# Patient Record
Sex: Male | Born: 1955 | Hispanic: No | Marital: Married | Smoking: Never smoker
Health system: Southern US, Community
[De-identification: ages and names within clinical notes are randomized; demographics above are authoritative.]

## PROBLEM LIST (undated history)

## (undated) DIAGNOSIS — I219 Acute myocardial infarction, unspecified: Secondary | ICD-10-CM

## (undated) DIAGNOSIS — I509 Heart failure, unspecified: Secondary | ICD-10-CM

## (undated) DIAGNOSIS — I2510 Atherosclerotic heart disease of native coronary artery without angina pectoris: Secondary | ICD-10-CM

## (undated) DIAGNOSIS — E785 Hyperlipidemia, unspecified: Secondary | ICD-10-CM

## (undated) DIAGNOSIS — E119 Type 2 diabetes mellitus without complications: Secondary | ICD-10-CM

## (undated) DIAGNOSIS — I4891 Unspecified atrial fibrillation: Secondary | ICD-10-CM

## (undated) DIAGNOSIS — I10 Essential (primary) hypertension: Secondary | ICD-10-CM

## (undated) DIAGNOSIS — E039 Hypothyroidism, unspecified: Secondary | ICD-10-CM

## (undated) DIAGNOSIS — A048 Other specified bacterial intestinal infections: Secondary | ICD-10-CM

## (undated) HISTORY — DX: Heart failure, unspecified: I50.9

## (undated) HISTORY — DX: Type 2 diabetes mellitus without complications: E11.9

## (undated) HISTORY — DX: Unspecified atrial fibrillation: I48.91

## (undated) HISTORY — DX: Hyperlipidemia, unspecified: E78.5

## (undated) HISTORY — DX: Acute myocardial infarction, unspecified: I21.9

## (undated) HISTORY — DX: Atherosclerotic heart disease of native coronary artery without angina pectoris: I25.10

## (undated) HISTORY — DX: Other specified bacterial intestinal infections: A04.8

---

## 2000-02-17 ENCOUNTER — Encounter: Admission: RE | Admit: 2000-02-17 | Discharge: 2000-02-17 | Payer: Self-pay | Admitting: Nephrology

## 2000-02-17 ENCOUNTER — Encounter: Payer: Self-pay | Admitting: Nephrology

## 2006-07-13 ENCOUNTER — Ambulatory Visit: Payer: Self-pay | Admitting: Family Medicine

## 2006-07-17 ENCOUNTER — Ambulatory Visit: Payer: Self-pay | Admitting: *Deleted

## 2006-07-31 ENCOUNTER — Ambulatory Visit: Payer: Self-pay | Admitting: Internal Medicine

## 2006-07-31 ENCOUNTER — Encounter (INDEPENDENT_AMBULATORY_CARE_PROVIDER_SITE_OTHER): Payer: Self-pay | Admitting: Nurse Practitioner

## 2006-07-31 LAB — CONVERTED CEMR LAB
ALT: 10 units/L (ref 0–53)
AST: 13 units/L (ref 0–37)
Albumin: 4.5 g/dL (ref 3.5–5.2)
Alkaline Phosphatase: 45 units/L (ref 39–117)
Bilirubin, Direct: 0.1 mg/dL (ref 0.0–0.3)
Cholesterol: 202 mg/dL — ABNORMAL HIGH (ref 0–200)
HDL: 36 mg/dL — ABNORMAL LOW (ref >39)
Indirect Bilirubin: 0.4 mg/dL (ref 0.0–0.9)
LDL Cholesterol: 128 mg/dL — ABNORMAL HIGH (ref 0–99)
Total Bilirubin: 0.5 mg/dL (ref 0.3–1.2)
Total CHOL/HDL Ratio: 5.6
Total Protein: 7.8 g/dL (ref 6.0–8.3)
Triglycerides: 188 mg/dL — ABNORMAL HIGH (ref <150)
VLDL: 38 mg/dL (ref 0–40)

## 2007-05-11 ENCOUNTER — Ambulatory Visit: Payer: Self-pay | Admitting: Nurse Practitioner

## 2007-05-11 LAB — CONVERTED CEMR LAB
ALT: 14 units/L (ref 0–53)
AST: 14 units/L (ref 0–37)
Albumin: 4.6 g/dL (ref 3.5–5.2)
Alkaline Phosphatase: 49 units/L (ref 39–117)
BUN: 14 mg/dL (ref 6–23)
Basophils Absolute: 0 10*3/uL (ref 0.0–0.1)
Basophils Relative: 1 % (ref 0–1)
CO2: 23 meq/L (ref 19–32)
Calcium: 9.8 mg/dL (ref 8.4–10.5)
Chloride: 101 meq/L (ref 96–112)
Cholesterol: 156 mg/dL (ref 0–200)
Creatinine, Ser: 1.03 mg/dL (ref 0.40–1.50)
Eosinophils Absolute: 0.2 10*3/uL (ref 0.0–0.7)
Eosinophils Relative: 3 % (ref 0–5)
Glucose, Bld: 90 mg/dL (ref 70–99)
HCT: 50.4 % (ref 39.0–52.0)
HDL: 39 mg/dL — ABNORMAL LOW (ref >39)
Hemoglobin: 16.4 g/dL (ref 13.0–17.0)
LDL Cholesterol: 89 mg/dL (ref 0–99)
Lymphocytes Relative: 37 % (ref 12–46)
Lymphs Abs: 2.7 10*3/uL (ref 0.7–4.0)
MCHC: 32.5 g/dL (ref 30.0–36.0)
MCV: 90.5 fL (ref 78.0–100.0)
Monocytes Absolute: 0.7 10*3/uL (ref 0.1–1.0)
Monocytes Relative: 10 % (ref 3–12)
Neutro Abs: 3.5 10*3/uL (ref 1.7–7.7)
Neutrophils Relative %: 49 % (ref 43–77)
PSA: 0.49 ng/mL (ref 0.10–4.00)
Platelets: 270 10*3/uL (ref 150–400)
Potassium: 4.7 meq/L (ref 3.5–5.3)
RBC: 5.57 M/uL (ref 4.22–5.81)
RDW: 13.2 % (ref 11.5–15.5)
Sodium: 138 meq/L (ref 135–145)
TSH: 1.251 microintl units/mL (ref 0.350–5.50)
Total Bilirubin: 0.5 mg/dL (ref 0.3–1.2)
Total CHOL/HDL Ratio: 4
Total Protein: 8.2 g/dL (ref 6.0–8.3)
Triglycerides: 141 mg/dL (ref <150)
VLDL: 28 mg/dL (ref 0–40)
WBC: 7.1 10*3/uL (ref 4.0–10.5)

## 2007-07-27 ENCOUNTER — Ambulatory Visit: Payer: Self-pay | Admitting: Internal Medicine

## 2007-07-27 LAB — CONVERTED CEMR LAB
ALT: 15 units/L (ref 0–53)
AST: 13 units/L (ref 0–37)
Albumin: 4.5 g/dL (ref 3.5–5.2)
Alkaline Phosphatase: 41 units/L (ref 39–117)
BUN: 18 mg/dL (ref 6–23)
Basophils Absolute: 0 10*3/uL (ref 0.0–0.1)
Basophils Relative: 1 % (ref 0–1)
CO2: 22 meq/L (ref 19–32)
Calcium: 9.3 mg/dL (ref 8.4–10.5)
Chloride: 100 meq/L (ref 96–112)
Cholesterol: 180 mg/dL (ref 0–200)
Creatinine, Ser: 0.98 mg/dL (ref 0.40–1.50)
Eosinophils Absolute: 0.1 10*3/uL (ref 0.0–0.7)
Eosinophils Relative: 2 % (ref 0–5)
Glucose, Bld: 88 mg/dL (ref 70–99)
HCT: 48.9 % (ref 39.0–52.0)
HDL: 35 mg/dL — ABNORMAL LOW (ref >39)
Hemoglobin: 16.2 g/dL (ref 13.0–17.0)
LDL Cholesterol: 120 mg/dL — ABNORMAL HIGH (ref 0–99)
Lymphocytes Relative: 40 % (ref 12–46)
Lymphs Abs: 2.6 10*3/uL (ref 0.7–4.0)
MCHC: 33.1 g/dL (ref 30.0–36.0)
MCV: 89.2 fL (ref 78.0–100.0)
Monocytes Absolute: 0.7 10*3/uL (ref 0.1–1.0)
Monocytes Relative: 10 % (ref 3–12)
Neutro Abs: 3.2 10*3/uL (ref 1.7–7.7)
Neutrophils Relative %: 48 % (ref 43–77)
Platelets: 261 10*3/uL (ref 150–400)
Potassium: 4 meq/L (ref 3.5–5.3)
RBC: 5.48 M/uL (ref 4.22–5.81)
RDW: 13.2 % (ref 11.5–15.5)
Sodium: 139 meq/L (ref 135–145)
Total Bilirubin: 0.7 mg/dL (ref 0.3–1.2)
Total CHOL/HDL Ratio: 5.1
Total Protein: 7.9 g/dL (ref 6.0–8.3)
Triglycerides: 123 mg/dL (ref <150)
VLDL: 25 mg/dL (ref 0–40)
WBC: 6.6 10*3/uL (ref 4.0–10.5)

## 2007-07-28 ENCOUNTER — Ambulatory Visit: Payer: Self-pay | Admitting: Family Medicine

## 2007-10-02 ENCOUNTER — Ambulatory Visit: Payer: Self-pay | Admitting: Internal Medicine

## 2007-10-02 LAB — CONVERTED CEMR LAB
HDL: 36 mg/dL — ABNORMAL LOW (ref >39)
LDL Cholesterol: 93 mg/dL (ref 0–99)
Triglycerides: 135 mg/dL (ref <150)
VLDL: 27 mg/dL (ref 0–40)

## 2008-06-13 ENCOUNTER — Ambulatory Visit: Payer: Self-pay | Admitting: Internal Medicine

## 2008-07-08 ENCOUNTER — Ambulatory Visit: Payer: Self-pay | Admitting: Internal Medicine

## 2008-08-22 ENCOUNTER — Encounter (INDEPENDENT_AMBULATORY_CARE_PROVIDER_SITE_OTHER): Payer: Self-pay | Admitting: *Deleted

## 2008-09-22 ENCOUNTER — Telehealth: Payer: Self-pay | Admitting: Cardiology

## 2008-09-23 ENCOUNTER — Ambulatory Visit: Payer: Self-pay | Admitting: Cardiology

## 2008-09-23 DIAGNOSIS — R079 Chest pain, unspecified: Secondary | ICD-10-CM | POA: Insufficient documentation

## 2008-09-23 DIAGNOSIS — E7800 Pure hypercholesterolemia, unspecified: Secondary | ICD-10-CM | POA: Insufficient documentation

## 2008-09-29 ENCOUNTER — Telehealth (INDEPENDENT_AMBULATORY_CARE_PROVIDER_SITE_OTHER): Payer: Self-pay | Admitting: *Deleted

## 2008-09-30 ENCOUNTER — Ambulatory Visit: Payer: Self-pay

## 2008-09-30 ENCOUNTER — Encounter: Payer: Self-pay | Admitting: Internal Medicine

## 2008-10-14 ENCOUNTER — Encounter: Payer: Self-pay | Admitting: Cardiology

## 2008-10-20 ENCOUNTER — Ambulatory Visit: Payer: Self-pay | Admitting: Cardiology

## 2008-11-03 ENCOUNTER — Other Ambulatory Visit: Admission: RE | Admit: 2008-11-03 | Discharge: 2008-11-03 | Payer: Self-pay | Admitting: Internal Medicine

## 2008-11-03 ENCOUNTER — Ambulatory Visit: Payer: Self-pay | Admitting: Internal Medicine

## 2008-11-20 ENCOUNTER — Ambulatory Visit (HOSPITAL_COMMUNITY): Admission: RE | Admit: 2008-11-20 | Discharge: 2008-11-20 | Payer: Self-pay | Admitting: Internal Medicine

## 2009-02-03 ENCOUNTER — Ambulatory Visit: Payer: Self-pay | Admitting: Internal Medicine

## 2009-04-14 ENCOUNTER — Ambulatory Visit: Payer: Self-pay | Admitting: Internal Medicine

## 2009-04-14 ENCOUNTER — Encounter (INDEPENDENT_AMBULATORY_CARE_PROVIDER_SITE_OTHER): Payer: Self-pay | Admitting: Family Medicine

## 2009-04-14 LAB — CONVERTED CEMR LAB
ALT: 18 units/L (ref 0–53)
AST: 17 units/L (ref 0–37)
Albumin: 4.7 g/dL (ref 3.5–5.2)
Alkaline Phosphatase: 66 units/L (ref 39–117)
BUN: 17 mg/dL (ref 6–23)
Basophils Absolute: 0.1 10*3/uL (ref 0.0–0.1)
Creatinine, Ser: 1.09 mg/dL (ref 0.40–1.50)
Eosinophils Relative: 3 % (ref 0–5)
HCT: 51.2 % (ref 39.0–52.0)
HDL: 36 mg/dL — ABNORMAL LOW (ref >39)
Hgb A1c MFr Bld: 8.9 % — ABNORMAL HIGH (ref 4.6–6.1)
LDL Cholesterol: 110 mg/dL — ABNORMAL HIGH (ref 0–99)
Lymphocytes Relative: 35 % (ref 12–46)
Neutrophils Relative %: 53 % (ref 43–77)
Platelets: 273 10*3/uL (ref 150–400)
Potassium: 4.9 meq/L (ref 3.5–5.3)
RDW: 13.8 % (ref 11.5–15.5)
Total CHOL/HDL Ratio: 5.3

## 2009-05-04 ENCOUNTER — Ambulatory Visit: Payer: Self-pay | Admitting: Internal Medicine

## 2009-05-11 ENCOUNTER — Ambulatory Visit (HOSPITAL_COMMUNITY): Admission: RE | Admit: 2009-05-11 | Discharge: 2009-05-11 | Payer: Self-pay | Admitting: Family Medicine

## 2009-08-03 ENCOUNTER — Ambulatory Visit: Payer: Self-pay | Admitting: Family Medicine

## 2009-08-06 ENCOUNTER — Ambulatory Visit: Payer: Self-pay | Admitting: Internal Medicine

## 2010-02-07 LAB — CONVERTED CEMR LAB
Alkaline Phosphatase: 51 units/L (ref 39–117)
Bilirubin, Direct: 0 mg/dL (ref 0.0–0.3)
Calcium: 9.6 mg/dL (ref 8.4–10.5)
Creatinine, Ser: 1 mg/dL (ref 0.4–1.5)
GFR calc non Af Amer: 100.57 mL/min (ref >60)
HDL: 29.5 mg/dL — ABNORMAL LOW (ref >39.00)
LDL Cholesterol: 133 mg/dL — ABNORMAL HIGH (ref 0–99)
Sodium: 136 meq/L (ref 135–145)
Total Bilirubin: 0.8 mg/dL (ref 0.3–1.2)
Total CHOL/HDL Ratio: 6
Total Protein: 8.1 g/dL (ref 6.0–8.3)
Triglycerides: 124 mg/dL (ref 0.0–149.0)
VLDL: 24.8 mg/dL (ref 0.0–40.0)

## 2012-09-25 ENCOUNTER — Emergency Department (HOSPITAL_COMMUNITY)
Admission: EM | Admit: 2012-09-25 | Discharge: 2012-09-25 | Disposition: A | Discharge status: Home / Self Care | Payer: Self-pay | Attending: Emergency Medicine | Admitting: Emergency Medicine

## 2012-09-25 ENCOUNTER — Emergency Department (HOSPITAL_COMMUNITY): Payer: Self-pay

## 2012-09-25 ENCOUNTER — Encounter (HOSPITAL_COMMUNITY): Payer: Self-pay | Admitting: Emergency Medicine

## 2012-09-25 DIAGNOSIS — R739 Hyperglycemia, unspecified: Secondary | ICD-10-CM

## 2012-09-25 DIAGNOSIS — E119 Type 2 diabetes mellitus without complications: Secondary | ICD-10-CM | POA: Insufficient documentation

## 2012-09-25 DIAGNOSIS — I10 Essential (primary) hypertension: Secondary | ICD-10-CM | POA: Insufficient documentation

## 2012-09-25 DIAGNOSIS — IMO0002 Reserved for concepts with insufficient information to code with codable children: Secondary | ICD-10-CM | POA: Insufficient documentation

## 2012-09-25 DIAGNOSIS — Y92410 Unspecified street and highway as the place of occurrence of the external cause: Secondary | ICD-10-CM | POA: Insufficient documentation

## 2012-09-25 DIAGNOSIS — R7309 Other abnormal glucose: Secondary | ICD-10-CM | POA: Insufficient documentation

## 2012-09-25 DIAGNOSIS — Y9389 Activity, other specified: Secondary | ICD-10-CM | POA: Insufficient documentation

## 2012-09-25 DIAGNOSIS — S0990XA Unspecified injury of head, initial encounter: Secondary | ICD-10-CM | POA: Insufficient documentation

## 2012-09-25 DIAGNOSIS — Z7982 Long term (current) use of aspirin: Secondary | ICD-10-CM | POA: Insufficient documentation

## 2012-09-25 DIAGNOSIS — S0993XA Unspecified injury of face, initial encounter: Secondary | ICD-10-CM | POA: Insufficient documentation

## 2012-09-25 HISTORY — DX: Type 2 diabetes mellitus without complications: E11.9

## 2012-09-25 HISTORY — DX: Essential (primary) hypertension: I10

## 2012-09-25 LAB — COMPREHENSIVE METABOLIC PANEL
ALT: 12 U/L (ref 0–53)
BUN: 11 mg/dL (ref 6–23)
CO2: 26 mEq/L (ref 19–32)
Calcium: 9.8 mg/dL (ref 8.4–10.5)
GFR calc Af Amer: >90 mL/min (ref >90)
GFR calc non Af Amer: >90 mL/min (ref >90)
Glucose, Bld: 395 mg/dL — ABNORMAL HIGH (ref 70–99)
Sodium: 134 mEq/L — ABNORMAL LOW (ref 135–145)

## 2012-09-25 LAB — CBC
HCT: 46.6 % (ref 39.0–52.0)
Hemoglobin: 16.7 g/dL (ref 13.0–17.0)
MCH: 30.1 pg (ref 26.0–34.0)
MCV: 84.1 fL (ref 78.0–100.0)
RBC: 5.54 MIL/uL (ref 4.22–5.81)

## 2012-09-25 LAB — GLUCOSE, CAPILLARY
Glucose-Capillary: 375 mg/dL — ABNORMAL HIGH (ref 70–99)
Glucose-Capillary: 305 mg/dL — ABNORMAL HIGH (ref 70–99)

## 2012-09-25 LAB — POCT I-STAT 3, VENOUS BLOOD GAS (G3P V)
Acid-Base Excess: 1 mmol/L (ref 0.0–2.0)
Bicarbonate: 26.6 mEq/L — ABNORMAL HIGH (ref 20.0–24.0)
TCO2: 28 mmol/L (ref 0–100)

## 2012-09-25 MED ORDER — METFORMIN HCL 500 MG PO TABS: 500.0000 mg | ORAL_TABLET | Freq: Two times a day (BID) | ORAL | Status: DC

## 2012-09-25 MED ORDER — SODIUM CHLORIDE 0.9 % IV BOLUS (SEPSIS)
1000.0000 mL | Freq: Once | INTRAVENOUS | Status: AC
  Administered 2012-09-25: 1000 mL via INTRAVENOUS

## 2012-09-25 MED ORDER — INSULIN ASPART 100 UNIT/ML ~~LOC~~ SOLN
5.0000 [IU] | Freq: Once | SUBCUTANEOUS | Status: AC
  Administered 2012-09-25: 5 [IU] via SUBCUTANEOUS
  Filled 2012-09-25: qty 1
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
## 2012-09-25 NOTE — ED Provider Notes (Signed)
Medical screening examination/treatment/procedure(s) were conducted as a shared visit with non-physician practitioner(s) and myself.  I personally evaluated the patient during the encounter  Glynn Octave, MD 09/25/12 2151

## 2013-06-07 ENCOUNTER — Ambulatory Visit
Admission: RE | Admit: 2013-06-07 | Discharge: 2013-06-07 | Disposition: A | Discharge status: Home / Self Care | Payer: BC Managed Care – PPO | Source: Ambulatory Visit | Attending: Family Medicine | Admitting: Family Medicine

## 2013-06-07 ENCOUNTER — Other Ambulatory Visit: Payer: Self-pay | Admitting: Family Medicine

## 2013-06-07 DIAGNOSIS — R109 Unspecified abdominal pain: Secondary | ICD-10-CM

## 2013-11-06 LAB — HM COLONOSCOPY

## 2014-08-28 IMAGING — CR DG ABDOMEN 2V
2 series · 2 of 2 positions shown · non-contrast
Comparison: None.

CLINICAL DATA: Left inferior quadrant pain radiating to the low
back.

EXAM:
ABDOMEN - 2 VIEW

[view not recorded (1 of 2)]
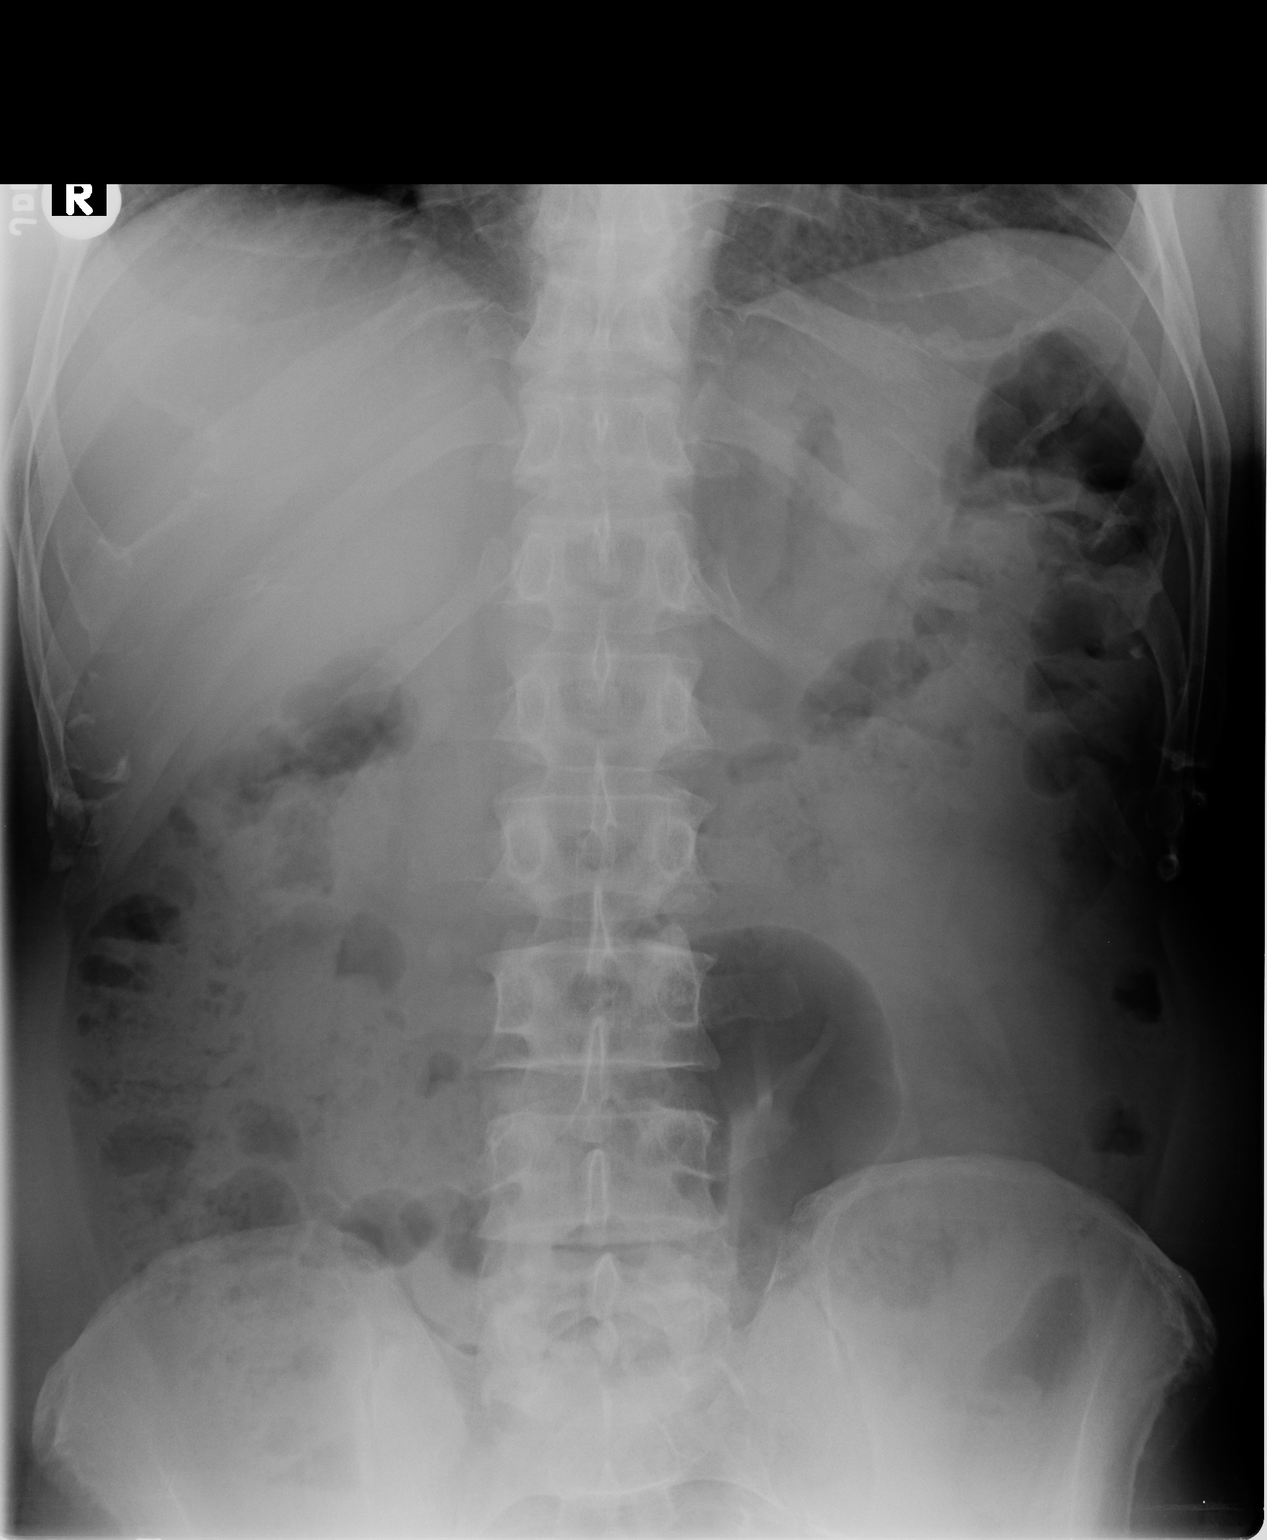

[view not recorded (2 of 2)]
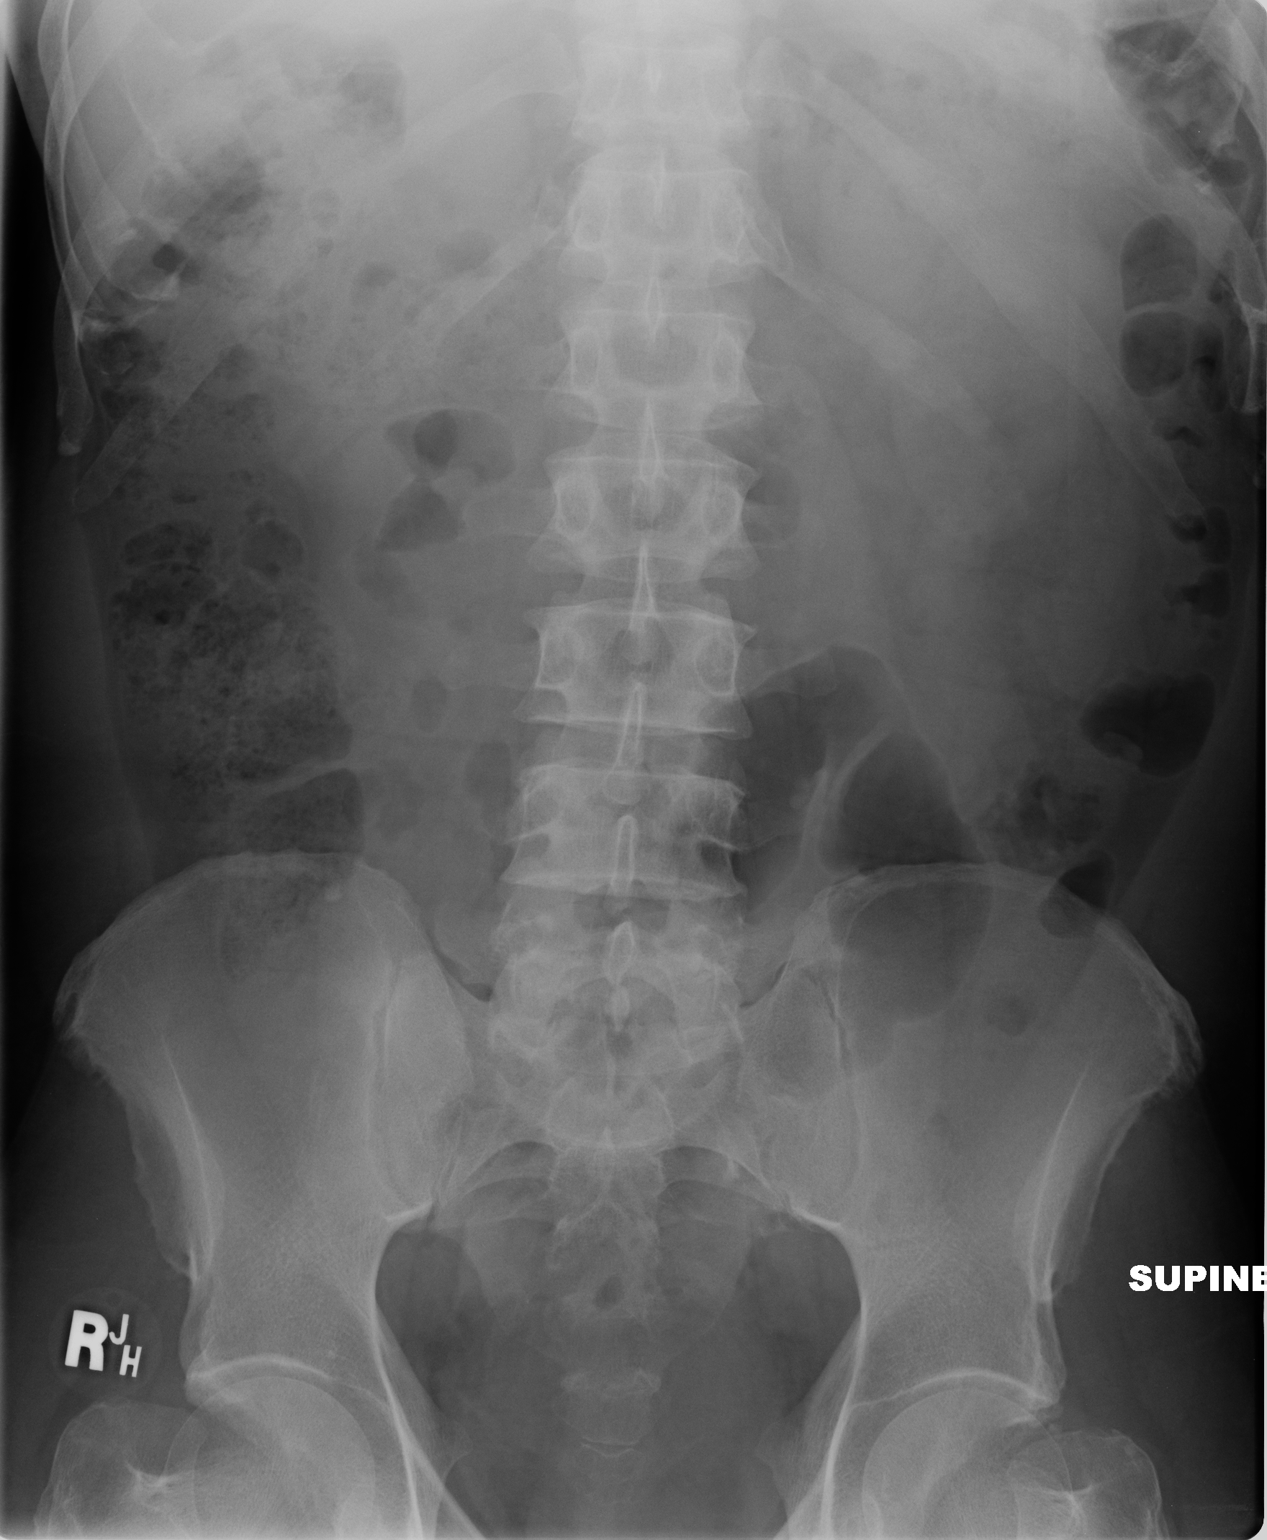

[2 of 2 positions shown; findings below may reference images not displayed]

FINDINGS: There is a large stool burden. Gaseous distension of colon is
present at the redundant sigmoid. There is no free air. No dilated
loops of bowel are present.
IMPRESSION: No plain film evidence of colonic perforation following colonoscopy.
Large right-sided stool burden. No acute abnormality. Nonobstructive
bowel gas pattern.

## 2015-01-23 DIAGNOSIS — E1165 Type 2 diabetes mellitus with hyperglycemia: Secondary | ICD-10-CM | POA: Insufficient documentation

## 2015-01-23 DIAGNOSIS — R202 Paresthesia of skin: Secondary | ICD-10-CM | POA: Insufficient documentation

## 2015-08-03 DIAGNOSIS — E559 Vitamin D deficiency, unspecified: Secondary | ICD-10-CM | POA: Insufficient documentation

## 2021-10-30 ENCOUNTER — Encounter (HOSPITAL_COMMUNITY): Payer: Self-pay | Admitting: Emergency Medicine

## 2021-10-30 ENCOUNTER — Other Ambulatory Visit: Payer: Self-pay

## 2021-10-30 ENCOUNTER — Inpatient Hospital Stay (HOSPITAL_COMMUNITY): Payer: Self-pay

## 2021-10-30 ENCOUNTER — Emergency Department (HOSPITAL_COMMUNITY): Payer: Self-pay

## 2021-10-30 ENCOUNTER — Inpatient Hospital Stay (HOSPITAL_COMMUNITY)
Admission: EM | Admit: 2021-10-30 | Discharge: 2021-10-31 | DRG: 322 | Disposition: A | Discharge status: Home / Self Care | Payer: Self-pay | Attending: Cardiology | Admitting: Cardiology

## 2021-10-30 ENCOUNTER — Inpatient Hospital Stay (HOSPITAL_COMMUNITY)
Admission: EM | Disposition: A | Discharge status: Home / Self Care | Payer: Self-pay | Source: Home / Self Care | Attending: Cardiology

## 2021-10-30 DIAGNOSIS — I213 ST elevation (STEMI) myocardial infarction of unspecified site: Principal | ICD-10-CM

## 2021-10-30 DIAGNOSIS — Z79899 Other long term (current) drug therapy: Secondary | ICD-10-CM

## 2021-10-30 DIAGNOSIS — I440 Atrioventricular block, first degree: Secondary | ICD-10-CM | POA: Diagnosis present

## 2021-10-30 DIAGNOSIS — E785 Hyperlipidemia, unspecified: Secondary | ICD-10-CM | POA: Diagnosis present

## 2021-10-30 DIAGNOSIS — Z7984 Long term (current) use of oral hypoglycemic drugs: Secondary | ICD-10-CM

## 2021-10-30 DIAGNOSIS — I2119 ST elevation (STEMI) myocardial infarction involving other coronary artery of inferior wall: Secondary | ICD-10-CM

## 2021-10-30 DIAGNOSIS — E1165 Type 2 diabetes mellitus with hyperglycemia: Secondary | ICD-10-CM | POA: Diagnosis present

## 2021-10-30 DIAGNOSIS — Z888 Allergy status to other drugs, medicaments and biological substances status: Secondary | ICD-10-CM

## 2021-10-30 DIAGNOSIS — Z7982 Long term (current) use of aspirin: Secondary | ICD-10-CM

## 2021-10-30 DIAGNOSIS — I10 Essential (primary) hypertension: Secondary | ICD-10-CM | POA: Diagnosis present

## 2021-10-30 HISTORY — PX: CORONARY/GRAFT ACUTE MI REVASCULARIZATION: CATH118305

## 2021-10-30 HISTORY — DX: ST elevation (STEMI) myocardial infarction involving other coronary artery of inferior wall: I21.19

## 2021-10-30 HISTORY — PX: LEFT HEART CATH AND CORONARY ANGIOGRAPHY: CATH118249

## 2021-10-30 LAB — COMPREHENSIVE METABOLIC PANEL
ALT: 13 U/L (ref 0–44)
AST: 13 U/L — ABNORMAL LOW (ref 15–41)
Albumin: 3.8 g/dL (ref 3.5–5.0)
Alkaline Phosphatase: 64 U/L (ref 38–126)
Anion gap: 10 (ref 5–15)
BUN: 20 mg/dL (ref 8–23)
CO2: 26 mmol/L (ref 22–32)
Calcium: 9.4 mg/dL (ref 8.9–10.3)
Chloride: 98 mmol/L (ref 98–111)
Creatinine, Ser: 1.12 mg/dL (ref 0.61–1.24)
GFR, Estimated: >60 mL/min (ref >60)
Glucose, Bld: 273 mg/dL — ABNORMAL HIGH (ref 70–99)
Potassium: 3.9 mmol/L (ref 3.5–5.1)
Sodium: 134 mmol/L — ABNORMAL LOW (ref 135–145)
Total Bilirubin: 0.4 mg/dL (ref 0.3–1.2)
Total Protein: 7.5 g/dL (ref 6.5–8.1)

## 2021-10-30 LAB — HEMOGLOBIN A1C
Hgb A1c MFr Bld: 10.4 % — ABNORMAL HIGH (ref 4.8–5.6)
Mean Plasma Glucose: 251.78 mg/dL

## 2021-10-30 LAB — BASIC METABOLIC PANEL
Anion gap: 11 (ref 5–15)
BUN: 19 mg/dL (ref 8–23)
CO2: 26 mmol/L (ref 22–32)
Calcium: 9.3 mg/dL (ref 8.9–10.3)
Chloride: 98 mmol/L (ref 98–111)
Creatinine, Ser: 1.07 mg/dL (ref 0.61–1.24)
GFR, Estimated: >60 mL/min (ref >60)
Glucose, Bld: 269 mg/dL — ABNORMAL HIGH (ref 70–99)
Potassium: 4.1 mmol/L (ref 3.5–5.1)
Sodium: 135 mmol/L (ref 135–145)

## 2021-10-30 LAB — CBC
HCT: 48 % (ref 39.0–52.0)
Hemoglobin: 15.7 g/dL (ref 13.0–17.0)
MCH: 29.3 pg (ref 26.0–34.0)
MCHC: 32.7 g/dL (ref 30.0–36.0)
MCV: 89.7 fL (ref 80.0–100.0)
Platelets: 244 10*3/uL (ref 150–400)
RBC: 5.35 MIL/uL (ref 4.22–5.81)
RDW: 12.3 % (ref 11.5–15.5)
WBC: 8.1 10*3/uL (ref 4.0–10.5)
nRBC: 0 % (ref 0.0–0.2)

## 2021-10-30 LAB — LIPID PANEL
Cholesterol: 192 mg/dL (ref 0–200)
HDL: 38 mg/dL — ABNORMAL LOW (ref >40)
LDL Cholesterol: 127 mg/dL — ABNORMAL HIGH (ref 0–99)
Total CHOL/HDL Ratio: 5.1 RATIO
Triglycerides: 133 mg/dL (ref <150)
VLDL: 27 mg/dL (ref 0–40)

## 2021-10-30 LAB — TROPONIN I (HIGH SENSITIVITY): Troponin I (High Sensitivity): 9 ng/L (ref <18)

## 2021-10-30 LAB — APTT: aPTT: 24 seconds (ref 24–36)

## 2021-10-30 LAB — GLUCOSE, CAPILLARY: Glucose-Capillary: 272 mg/dL — ABNORMAL HIGH (ref 70–99)

## 2021-10-30 LAB — PROTIME-INR
INR: 1.3 — ABNORMAL HIGH (ref 0.8–1.2)
Prothrombin Time: 15.7 seconds — ABNORMAL HIGH (ref 11.4–15.2)

## 2021-10-30 SURGERY — CORONARY/GRAFT ACUTE MI REVASCULARIZATION
Anesthesia: LOCAL

## 2021-10-30 MED ORDER — FENTANYL CITRATE PF 50 MCG/ML IJ SOSY
50.0000 ug | PREFILLED_SYRINGE | Freq: Once | INTRAMUSCULAR | Status: AC
  Administered 2021-10-30: 50 ug via INTRAVENOUS

## 2021-10-30 MED ORDER — HEPARIN SODIUM (PORCINE) 5000 UNIT/ML IJ SOLN: 60.0000 [IU]/kg | Freq: Once | INTRAMUSCULAR | Status: DC

## 2021-10-30 MED ORDER — ORAL CARE MOUTH RINSE: 15.0000 mL | OROMUCOSAL | Status: DC | PRN

## 2021-10-30 MED ORDER — ASPIRIN 81 MG PO CHEW: 324.0000 mg | CHEWABLE_TABLET | Freq: Once | ORAL | Status: DC

## 2021-10-30 MED ORDER — SODIUM CHLORIDE 0.9 % IV SOLN: INTRAVENOUS | Status: DC

## 2021-10-30 MED ORDER — ATROPINE SULFATE 1 MG/10ML IJ SOSY
0.5000 mg | PREFILLED_SYRINGE | Freq: Once | INTRAMUSCULAR | Status: AC
  Administered 2021-10-30: 0.5 mg via INTRAVENOUS

## 2021-10-30 MED ORDER — LOSARTAN POTASSIUM 25 MG PO TABS
25.0000 mg | ORAL_TABLET | Freq: Every evening | ORAL | Status: DC
  Administered 2021-10-30: 25 mg via ORAL
  Filled 2021-10-30: qty 1

## 2021-10-30 MED ORDER — SODIUM CHLORIDE 0.9 % WEIGHT BASED INFUSION
1.0000 mL/kg/h | INTRAVENOUS | Status: AC
  Administered 2021-10-30: 1 mL/kg/h via INTRAVENOUS

## 2021-10-30 MED ORDER — TICAGRELOR 90 MG PO TABS
90.0000 mg | ORAL_TABLET | Freq: Two times a day (BID) | ORAL | Status: DC
  Administered 2021-10-31: 90 mg via ORAL
  Filled 2021-10-30: qty 1

## 2021-10-30 MED ORDER — LIDOCAINE HCL (PF) 1 % IJ SOLN
INTRAMUSCULAR | Status: AC
  Filled 2021-10-30: qty 30

## 2021-10-30 MED ORDER — HEPARIN SODIUM (PORCINE) 5000 UNIT/ML IJ SOLN
4000.0000 [IU] | Freq: Once | INTRAMUSCULAR | Status: AC
  Administered 2021-10-30: 4000 [IU] via INTRAVENOUS

## 2021-10-30 MED ORDER — HEPARIN (PORCINE) IN NACL 1000-0.9 UT/500ML-% IV SOLN
INTRAVENOUS | Status: AC
  Filled 2021-10-30: qty 500

## 2021-10-30 MED ORDER — HEPARIN (PORCINE) IN NACL 1000-0.9 UT/500ML-% IV SOLN
INTRAVENOUS | Status: DC | PRN
  Administered 2021-10-30 (×2): 500 mL

## 2021-10-30 MED ORDER — HYDRALAZINE HCL 20 MG/ML IJ SOLN: 5.0000 mg | INTRAMUSCULAR | Status: DC | PRN

## 2021-10-30 MED ORDER — ASPIRIN 81 MG PO CHEW
81.0000 mg | CHEWABLE_TABLET | Freq: Every day | ORAL | Status: DC
  Administered 2021-10-31: 81 mg via ORAL
  Filled 2021-10-30: qty 1

## 2021-10-30 MED ORDER — VERAPAMIL HCL 2.5 MG/ML IV SOLN
INTRAVENOUS | Status: AC
  Filled 2021-10-30: qty 2

## 2021-10-30 MED ORDER — NITROGLYCERIN 1 MG/10 ML FOR IR/CATH LAB
INTRA_ARTERIAL | Status: DC | PRN
  Administered 2021-10-30 (×2): 200 ug via INTRA_ARTERIAL

## 2021-10-30 MED ORDER — INSULIN ASPART 100 UNIT/ML IJ SOLN
0.0000 [IU] | Freq: Three times a day (TID) | INTRAMUSCULAR | Status: DC
  Administered 2021-10-31 (×2): 5 [IU] via SUBCUTANEOUS

## 2021-10-30 MED ORDER — EMPAGLIFLOZIN 10 MG PO TABS
10.0000 mg | ORAL_TABLET | Freq: Every day | ORAL | Status: DC
  Administered 2021-10-31: 10 mg via ORAL
  Filled 2021-10-30: qty 1

## 2021-10-30 MED ORDER — FENTANYL CITRATE (PF) 100 MCG/2ML IJ SOLN
INTRAMUSCULAR | Status: AC
  Filled 2021-10-30: qty 2

## 2021-10-30 MED ORDER — ACETAMINOPHEN 325 MG PO TABS: 650.0000 mg | ORAL_TABLET | ORAL | Status: DC | PRN

## 2021-10-30 MED ORDER — TICAGRELOR 90 MG PO TABS
180.0000 mg | ORAL_TABLET | Freq: Once | ORAL | Status: AC
  Administered 2021-10-30: 180 mg via ORAL
  Filled 2021-10-30: qty 2

## 2021-10-30 MED ORDER — LIDOCAINE HCL (PF) 1 % IJ SOLN
INTRAMUSCULAR | Status: DC | PRN
  Administered 2021-10-30: 2 mL via INTRADERMAL

## 2021-10-30 MED ORDER — HEPARIN (PORCINE) IN NACL 2000-0.9 UNIT/L-% IV SOLN
INTRAVENOUS | Status: AC
  Filled 2021-10-30: qty 1000

## 2021-10-30 MED ORDER — ONDANSETRON HCL 4 MG/2ML IJ SOLN: 4.0000 mg | Freq: Four times a day (QID) | INTRAMUSCULAR | Status: DC | PRN

## 2021-10-30 MED ORDER — ATORVASTATIN CALCIUM 80 MG PO TABS
80.0000 mg | ORAL_TABLET | Freq: Every day | ORAL | Status: DC
  Administered 2021-10-31: 80 mg via ORAL
  Filled 2021-10-30: qty 1

## 2021-10-30 MED ORDER — IOHEXOL 350 MG/ML SOLN
INTRAVENOUS | Status: DC | PRN
  Administered 2021-10-30: 110 mL via INTRA_ARTERIAL

## 2021-10-30 MED ORDER — TICAGRELOR 90 MG PO TABS: 90.0000 mg | ORAL_TABLET | Freq: Two times a day (BID) | ORAL | Status: DC

## 2021-10-30 MED ORDER — INSULIN ASPART 100 UNIT/ML IJ SOLN
0.0000 [IU] | Freq: Every day | INTRAMUSCULAR | Status: DC
  Administered 2021-10-30: 3 [IU] via SUBCUTANEOUS

## 2021-10-30 MED ORDER — HEPARIN SODIUM (PORCINE) 1000 UNIT/ML IJ SOLN
INTRAMUSCULAR | Status: DC | PRN
  Administered 2021-10-30: 7000 [IU] via INTRAVENOUS

## 2021-10-30 MED ORDER — SODIUM CHLORIDE 0.9% FLUSH: 3.0000 mL | INTRAVENOUS | Status: DC | PRN

## 2021-10-30 MED ORDER — METOPROLOL TARTRATE 25 MG PO TABS
25.0000 mg | ORAL_TABLET | Freq: Two times a day (BID) | ORAL | Status: DC
  Administered 2021-10-30: 25 mg via ORAL
  Filled 2021-10-30: qty 1

## 2021-10-30 MED ORDER — SODIUM CHLORIDE 0.9 % IV SOLN: 250.0000 mL | INTRAVENOUS | Status: DC | PRN

## 2021-10-30 MED ORDER — HEPARIN SODIUM (PORCINE) 1000 UNIT/ML IJ SOLN
INTRAMUSCULAR | Status: AC
  Filled 2021-10-30: qty 10

## 2021-10-30 MED ORDER — SODIUM CHLORIDE 0.9% FLUSH: 3.0000 mL | Freq: Two times a day (BID) | INTRAVENOUS | Status: DC

## 2021-10-30 MED ORDER — HEPARIN (PORCINE) 25000 UT/250ML-% IV SOLN
950.0000 [IU]/h | INTRAVENOUS | Status: DC
  Administered 2021-10-30: 950 [IU]/h via INTRAVENOUS

## 2021-10-30 MED ORDER — ASPIRIN 81 MG PO CHEW
81.0000 mg | CHEWABLE_TABLET | Freq: Once | ORAL | Status: AC
  Administered 2021-10-30: 81 mg via ORAL

## 2021-10-30 MED ORDER — NITROGLYCERIN 1 MG/10 ML FOR IR/CATH LAB
INTRA_ARTERIAL | Status: AC
  Filled 2021-10-30: qty 10

## 2021-10-30 SURGICAL SUPPLY — 17 items
BALLN SAPPHIRE 2.5X12 (BALLOONS) ×1
BALLOON SAPPHIRE 2.5X12 (BALLOONS) IMPLANT
CATH INFINITI 5 FR JL3.5 (CATHETERS) IMPLANT
CATH LAUNCHER 6FR JR4 (CATHETERS) IMPLANT
CATH OPTITORQUE TIG 4.0 5F (CATHETERS) IMPLANT
DEVICE RAD COMP TR BAND LRG (VASCULAR PRODUCTS) IMPLANT
GLIDESHEATH SLEND A-KIT 6F 22G (SHEATH) IMPLANT
GUIDEWIRE INQWIRE 1.5J.035X260 (WIRE) IMPLANT
INQWIRE 1.5J .035X260CM (WIRE) ×1
KIT ENCORE 26 ADVANTAGE (KITS) IMPLANT
KIT HEART LEFT (KITS) ×2 IMPLANT
PACK CARDIAC CATHETERIZATION (CUSTOM PROCEDURE TRAY) ×2 IMPLANT
STENT SYNERGY XD 2.75X32 (Permanent Stent) IMPLANT
SYNERGY XD 2.75X32 (Permanent Stent) ×1 IMPLANT
TRANSDUCER W/STOPCOCK (MISCELLANEOUS) ×2 IMPLANT
TUBING CIL FLEX 10 FLL-RA (TUBING) ×2 IMPLANT
WIRE COUGAR XT STRL 190CM (WIRE) IMPLANT

## 2021-10-30 NOTE — ED Provider Notes (Signed)
  [facility] DEPART[facility] Provider Note   CSN: [id number] Arrival date & time: 10/30/21  1849     History {Add pertinent medical, surgical, social history, OB history to HPI:1} Chief Complaint  Patient presents with   Chest Pain    Garrett Roberts is a 66 y.o. male.  1.5 hours cp and sweaty n but no v  between 6-7 no dizzy no fam hx no smoke no cardiac only hx of DM and HLD Took 3 baby aspirin prior to arrival       Home Medications Prior to Admission medications   Medication Sig Start Date End Date Taking? Authorizing Provider  aspirin 81 MG tablet Take 81 mg by mouth daily.    [provider]  metFORMIN (GLUCOPHAGE) 500 MG tablet Take 1 tablet (500 mg total) by mouth 2 (two) times daily. 09/25/12   Schinlever, Barnetta Chapel, PA-C  METFORMIN HCL PO Take by mouth.    [provider]      Allergies    Atorvastatin    Review of Systems   Review of Systems  Physical Exam Updated Vital Signs There were no vitals taken for this visit. Physical Exam  ED Results / Procedures / Treatments   Labs (all labs ordered are listed, but only abnormal results are displayed) Labs Reviewed  BASIC METABOLIC PANEL  CBC  TROPONIN I (HIGH SENSITIVITY)    EKG None  Radiology No results found.  Procedures Procedures  {Document cardiac monitor, telemetry assessment procedure when appropriate:1}  Medications Ordered in ED Medications - No data to display  ED Course/ Medical Decision Making/ A&P Clinical Course as of 10/30/21 1917  Sat Oct 30, 2021  1914 Cath lab activated.  [RP]  1916 Dr Alfred Levins from cardiology at the bedside.  [RP]    Clinical Course User Index [RP] Fransico Meadow, MD                           Medical Decision Making Amount and/or Complexity of Data Reviewed Labs: ordered. Radiology: ordered.  Risk OTC drugs. Prescription drug management.   ***  {Document critical care time when  appropriate:1} {Document review of labs and clinical decision tools ie heart score, Chads2Vasc2 etc:1}  {Document your independent review of radiology images, and any outside records:1} {Document your discussion with family members, caretakers, and with consultants:1} {Document social determinants of health affecting pt's care:1} {Document your decision making why or why not admission, treatments were needed:1} Final Clinical Impression(s) / ED Diagnoses Final diagnoses:  None    Rx / DC Orders ED Discharge Orders     None

## 2021-10-30 NOTE — ED Notes (Signed)
Taken to Cath lab with RN and cardiologist

## 2021-10-30 NOTE — ED Triage Notes (Signed)
Patient here with chest pain that started an hour ago with [facility]. No cardiac hx per pt.
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
## 2021-10-30 NOTE — Progress Notes (Signed)
ANTICOAGULATION CONSULT NOTE - Initial Consult  Pharmacy Consult for Heparin Indication:  STEMI  Allergies  Allergen Reactions   Atorvastatin     REACTION: myalgias    Patient Measurements: Height: 5\' 9"  (175.3 cm) Weight: 79.4 kg (175 lb) IBW/kg (Calculated) : 70.7 Heparin Dosing Weight: 79kg  Vital Signs: BP: 197/82 (10/21 1930) Pulse Rate: 54 (10/21 1930)  Labs: Recent Labs    10/30/21 1900  HGB 15.7  HCT 48.0  PLT 244    CrCl cannot be calculated (Patient's most recent lab result is older than the maximum 21 days allowed.).   Medical History: Past Medical History:  Diagnosis Date   Diabetes mellitus without complication ([place])    Hypertension     Medications:  (Not in a hospital admission)  Scheduled:   fentaNYL       fentaNYL (SUBLIMAZE) injection  50 mcg Intravenous Once   ticagrelor  180 mg Oral Once   Infusions:   sodium chloride     heparin 950 Units/hr (10/30/21 1928)   PRN: fentaNYL  Assessment: 63 yom with presenting with STEMI. Heparin per pharmacy consult placed for  STEMI .  Patient is not on anticoagulation prior to arrival.  Hgb 15.7; plt 244  Goal of Therapy:  Heparin level 0.3-0.7 units/ml Monitor platelets by anticoagulation protocol: Yes   Plan:  Give IV heparin 4000 units bolus x 1 Start heparin infusion at 950 units/hr Pharmacy to f/u heparin plan post cath Continue to monitor H&H and platelets  Lorelei Pont, PharmD, BCPS 10/30/2021 7:34 PM ED Clinical Pharmacist -  [phone]

## 2021-10-31 ENCOUNTER — Inpatient Hospital Stay (HOSPITAL_COMMUNITY): Payer: Self-pay

## 2021-10-31 ENCOUNTER — Encounter (HOSPITAL_COMMUNITY): Payer: Self-pay | Admitting: Cardiology

## 2021-10-31 LAB — ECHOCARDIOGRAM COMPLETE
Area-P 1/2: 3.12 cm2
Height: 69 in
S' Lateral: 3.4 cm
Weight: 2800 oz

## 2021-10-31 LAB — BASIC METABOLIC PANEL
Anion gap: 9 (ref 5–15)
BUN: 14 mg/dL (ref 8–23)
CO2: 23 mmol/L (ref 22–32)
Calcium: 9.1 mg/dL (ref 8.9–10.3)
Chloride: 104 mmol/L (ref 98–111)
Creatinine, Ser: 0.79 mg/dL (ref 0.61–1.24)
GFR, Estimated: >60 mL/min (ref >60)
Glucose, Bld: 253 mg/dL — ABNORMAL HIGH (ref 70–99)
Potassium: 3.9 mmol/L (ref 3.5–5.1)
Sodium: 136 mmol/L (ref 135–145)

## 2021-10-31 LAB — CBC
HCT: 44.4 % (ref 39.0–52.0)
Hemoglobin: 15.2 g/dL (ref 13.0–17.0)
MCH: 29.6 pg (ref 26.0–34.0)
MCHC: 34.2 g/dL (ref 30.0–36.0)
MCV: 86.4 fL (ref 80.0–100.0)
Platelets: 216 10*3/uL (ref 150–400)
RBC: 5.14 MIL/uL (ref 4.22–5.81)
RDW: 12.1 % (ref 11.5–15.5)
WBC: 10.7 10*3/uL — ABNORMAL HIGH (ref 4.0–10.5)
nRBC: 0 % (ref 0.0–0.2)

## 2021-10-31 LAB — GLUCOSE, CAPILLARY
Glucose-Capillary: 224 mg/dL — ABNORMAL HIGH (ref 70–99)
Glucose-Capillary: 230 mg/dL — ABNORMAL HIGH (ref 70–99)

## 2021-10-31 LAB — POCT I-STAT, CHEM 8
BUN: 20 mg/dL (ref 8–23)
Calcium, Ion: 1.12 mmol/L — ABNORMAL LOW (ref 1.15–1.40)
Chloride: 99 mmol/L (ref 98–111)
Creatinine, Ser: 0.8 mg/dL (ref 0.61–1.24)
Glucose, Bld: 297 mg/dL — ABNORMAL HIGH (ref 70–99)
HCT: 48 % (ref 39.0–52.0)
Hemoglobin: 16.3 g/dL (ref 13.0–17.0)
Potassium: 4.1 mmol/L (ref 3.5–5.1)
Sodium: 134 mmol/L — ABNORMAL LOW (ref 135–145)
TCO2: 24 mmol/L (ref 22–32)

## 2021-10-31 LAB — MRSA NEXT GEN BY PCR, NASAL: MRSA by PCR Next Gen: NOT DETECTED

## 2021-10-31 LAB — POCT ACTIVATED CLOTTING TIME: Activated Clotting Time: 287 seconds

## 2021-10-31 LAB — TROPONIN I (HIGH SENSITIVITY): Troponin I (High Sensitivity): 8096 ng/L (ref <18)

## 2021-10-31 MED ORDER — TICAGRELOR 90 MG PO TABS
90.0000 mg | ORAL_TABLET | Freq: Once | ORAL | Status: AC
  Administered 2021-10-31: 90 mg via ORAL
  Filled 2021-10-31: qty 1

## 2021-10-31 MED ORDER — EMPAGLIFLOZIN 10 MG PO TABS: 10.0000 mg | ORAL_TABLET | Freq: Every day | ORAL | 6 refills | Status: DC

## 2021-10-31 MED ORDER — PERFLUTREN LIPID MICROSPHERE
1.0000 mL | INTRAVENOUS | Status: AC | PRN
  Administered 2021-10-31: 3 mL via INTRAVENOUS

## 2021-10-31 MED ORDER — ATORVASTATIN CALCIUM 80 MG PO TABS: 80.0000 mg | ORAL_TABLET | Freq: Every day | ORAL | 6 refills | Status: DC

## 2021-10-31 MED ORDER — LOSARTAN POTASSIUM 25 MG PO TABS: 25.0000 mg | ORAL_TABLET | Freq: Every evening | ORAL | 6 refills | Status: DC

## 2021-10-31 MED ORDER — METOPROLOL TARTRATE 25 MG PO TABS: 12.5000 mg | ORAL_TABLET | Freq: Two times a day (BID) | ORAL | 6 refills | Status: DC

## 2021-10-31 MED ORDER — ASPIRIN 81 MG PO CHEW: 81.0000 mg | CHEWABLE_TABLET | Freq: Every day | ORAL | 6 refills | Status: DC

## 2021-10-31 MED ORDER — TICAGRELOR 90 MG PO TABS: 90.0000 mg | ORAL_TABLET | Freq: Two times a day (BID) | ORAL | 6 refills | Status: DC

## 2021-10-31 NOTE — Progress Notes (Signed)
Patient transferred from [facility] at 1315hrs. Oriented to unit and plan of care for shift, patient verbalized understanding.  Family at bedside.

## 2021-10-31 NOTE — Progress Notes (Signed)
Subjective:  Patient seen and examined at bedside, resting comfortably. No events overnight. Denies CP, SOB, palpitations, diaphoresis, syncope.  Intake/Output from previous day:  I/O last 3 completed shifts: In: 773.2 [I.V.:773.2] Out: 1800 [Urine:1800] Total I/O In: 79.4 [I.V.:79.4] Out: -  Net IO Since Admission: -300.37 mL [10/31/21 1118]  Blood pressure 133/68, pulse (!) 55, temperature 98.2 F (36.8 C), temperature source Oral, resp. rate 18, height $RemoveBe'5\' 9"'gICZPrPmi$  (1.753 m), weight 79.4 kg, SpO2 99 %. Physical Exam Vitals reviewed.  Constitutional:      General: He is not in acute distress. Cardiovascular:     Rate and Rhythm: Regular rhythm. Bradycardia present.     Pulses: Normal pulses.     Heart sounds: Normal heart sounds. No murmur heard. Pulmonary:     Effort: Pulmonary effort is normal.     Breath sounds: Normal breath sounds.  Musculoskeletal:     Right lower leg: No edema.     Left lower leg: No edema.  Skin:    General: Skin is warm and dry.  Neurological:     Mental Status: He is alert.     Lab Results: Lab Results  Component Value Date   NA 136 10/31/2021   K 3.9 10/31/2021   CO2 23 10/31/2021   GLUCOSE 253 (H) 10/31/2021   BUN 14 10/31/2021   CREATININE 0.79 10/31/2021   CALCIUM 9.1 10/31/2021   GFRNONAA >60 10/31/2021    BNP (last 3 results) No results for input(s): "BNP" in the last 8760 hours.  ProBNP (last 3 results) No results for input(s): "PROBNP" in the last 8760 hours.    Latest Ref Rng & Units 10/31/2021    3:51 AM 10/30/2021    9:21 PM 10/30/2021    8:13 PM  BMP  Glucose 70 - 99 mg/dL 253  269  297   BUN 8 - 23 mg/dL $Remove'14  19  20   'SFQMjay$ Creatinine 0.61 - 1.24 mg/dL 0.79  1.07  0.80   Sodium 135 - 145 mmol/L 136  135  134   Potassium 3.5 - 5.1 mmol/L 3.9  4.1  4.1   Chloride 98 - 111 mmol/L 104  98  99   CO2 22 - 32 mmol/L 23  26    Calcium 8.9 - 10.3 mg/dL 9.1  9.3        Latest Ref Rng & Units 10/30/2021    7:00 PM 09/25/2012     6:23 PM 04/14/2009   10:41 PM  Hepatic Function  Total Protein 6.5 - 8.1 g/dL 7.5  8.3  7.8   Albumin 3.5 - 5.0 g/dL 3.8  4.1  4.7   AST 15 - 41 U/L $Remo'13  16  17   'VbxcF$ ALT 0 - 44 U/L $Remo'13  12  18   'PAwcw$ Alk Phosphatase 38 - 126 U/L 64  89  66   Total Bilirubin 0.3 - 1.2 mg/dL 0.4  0.3  0.5       Latest Ref Rng & Units 10/31/2021    3:51 AM 10/30/2021    8:13 PM 10/30/2021    7:00 PM  CBC  WBC 4.0 - 10.5 K/uL 10.7   8.1   Hemoglobin 13.0 - 17.0 g/dL 15.2  16.3  15.7   Hematocrit 39.0 - 52.0 % 44.4  48.0  48.0   Platelets 150 - 400 K/uL 216   244    Lipid Panel     Component Value Date/Time   CHOL 192 10/30/2021 1900   TRIG  133 10/30/2021 1900   HDL 38 (L) 10/30/2021 1900   CHOLHDL 5.1 10/30/2021 1900   VLDL 27 10/30/2021 1900   LDLCALC 127 (H) 10/30/2021 1900   Cardiac Panel (last 3 results) No results for input(s): "CKTOTAL", "CKMB", "TROPONINI", "RELINDX" in the last 72 hours.  HEMOGLOBIN A1C Lab Results  Component Value Date   HGBA1C 10.4 (H) 10/30/2021   MPG 251.78 10/30/2021   TSH No results for input(s): "TSH" in the last 8760 hours. Imaging: Imaging results have been reviewed and ECHOCARDIOGRAM COMPLETE  Result Date: 10/31/2021    ECHOCARDIOGRAM REPORT   Patient Name:   Garrett Roberts Date of Exam: 10/31/2021 Medical Rec #:  [id number]            Height:       69.0 in Accession #:    [id number]           Weight:       175.0 lb Date of Birth:  14-Feb-1955           BSA:          1.952 m Patient Age:    66 years             BP:           125/63 mmHg Patient Gender: M                    HR:           52 bpm. Exam Location:  Inpatient Procedure: 2D Echo, 3D Echo, Cardiac Doppler, Color Doppler and Intracardiac            Opacification Agent Indications:     Acute myocardial infarction, unspecified I21.9  History:         Patient has no prior history of Echocardiogram examinations.                  STEMI, Signs/Symptoms:Chest Pain; Risk Factors:Dyslipidemia,                   Diabetes, Hypertension and Non-Smoker.  Sonographer:     Greer Pickerel Referring Phys:  [id number] [facility] Lashun Mccants Diagnosing Phys: Collene Mares Denny Mccree  Sonographer Comments: Image acquisition challenging due to respiratory motion. IMPRESSIONS  1. Left ventricular ejection fraction, by estimation, is 55 to 60%. Left ventricular ejection fraction by 3D volume is 58 %. Left ventricular ejection fraction by PLAX is 58 %. The left ventricle has normal function. Left ventricular endocardial border not optimally defined to evaluate regional wall motion. Left ventricular diastolic parameters are consistent with Grade I diastolic dysfunction (impaired relaxation). Elevated left atrial pressure. The E/e' is 14.5.  2. Right ventricular systolic function is normal. The right ventricular size is normal. There is normal pulmonary artery systolic pressure.  3. The mitral valve is normal in structure. No evidence of mitral valve regurgitation.  4. The aortic valve is normal in structure. Aortic valve regurgitation is not visualized. Conclusion(s)/Recommendation(s): Normal biventricular function without evidence of hemodynamically significant valvular heart disease. FINDINGS  Left Ventricle: Left ventricular ejection fraction, by estimation, is 55 to 60%. Left ventricular ejection fraction by PLAX is 58 %. Left ventricular ejection fraction by 3D volume is 58 %. The left ventricle has normal function. Left ventricular endocardial border not optimally defined to evaluate regional wall motion. Definity contrast agent was given IV to delineate the left ventricular endocardial borders. The left ventricular internal cavity size was normal in size. Suboptimal image quality limits for assessment of left  ventricular hypertrophy. Left ventricular diastolic parameters are consistent with Grade I diastolic dysfunction (impaired relaxation). Elevated left atrial pressure. The E/e' is 14.5. Right Ventricle: The right ventricular size is normal. No  increase in right ventricular wall thickness. Right ventricular systolic function is normal. There is normal pulmonary artery systolic pressure. The tricuspid regurgitant velocity is 1.30 m/s, and  with an assumed right atrial pressure of 3 mmHg, the estimated right ventricular systolic pressure is 9.8 mmHg. Left Atrium: Left atrial size was normal in size. Right Atrium: Right atrial size was normal in size. Pericardium: There is no evidence of pericardial effusion. Mitral Valve: The mitral valve is normal in structure. No evidence of mitral valve regurgitation. Tricuspid Valve: The tricuspid valve is normal in structure. Tricuspid valve regurgitation is not demonstrated. Aortic Valve: The aortic valve is normal in structure. Aortic valve regurgitation is not visualized. Pulmonic Valve: The pulmonic valve was normal in structure. Pulmonic valve regurgitation is not visualized. Aorta: The aortic root is normal in size and structure. IAS/Shunts: No atrial level shunt detected by color flow Doppler.  LEFT VENTRICLE PLAX 2D LV EF:         Left            Diastology                ventricular     LV e' medial:    5.27 cm/s                ejection        LV E/e' medial:  17.3                fraction by     LV e' lateral:   7.54 cm/s                PLAX is 58      LV E/e' lateral: 12.1                %. LVIDd:         4.90 cm LVIDs:         3.40 cm         3D Volume EF LV PW:         0.80 cm         LV 3D EF:    Left LV IVS:        1.20 cm                      ventricul LVOT diam:     2.00 cm                      ar LV SV:         67                           ejection LV SV Index:   34                           fraction LVOT Area:     3.14 cm                     by 3D  volume is                                             58 %.                                 3D Volume EF:                                3D EF:        58 %                                LV EDV:       178 ml                                 LV ESV:       76 ml                                LV SV:        103 ml RIGHT VENTRICLE RV S prime:     15.30 cm/s TAPSE (M-mode): 2.0 cm LEFT ATRIUM             Index        RIGHT ATRIUM           Index LA diam:        3.70 cm 1.90 cm/m   RA Area:     21.20 cm LA Vol (A2C):   43.6 ml 22.34 ml/m  RA Volume:   64.10 ml  32.84 ml/m LA Vol (A4C):   56.8 ml 29.10 ml/m LA Biplane Vol: 50.6 ml 25.92 ml/m  AORTIC VALVE LVOT Vmax:   88.60 cm/s LVOT Vmean:  56.800 cm/s LVOT VTI:    0.214 m  AORTA Ao Root diam: 3.80 cm Ao Asc diam:  4.05 cm MITRAL VALVE                TRICUSPID VALVE MV Area (PHT): 3.12 cm     TR Peak grad:   6.8 mmHg MV Decel Time: 243 msec     TR Vmax:        130.00 cm/s MV E velocity: 91.30 cm/s MV A velocity: 106.00 cm/s  SHUNTS MV E/A ratio:  0.86         Systemic VTI:  0.21 m                             Systemic Diam: 2.00 cm Collene Mares Ronesha Heenan Electronically signed by Floydene Flock Signature Date/Time: 10/31/2021/3:41:50 PM    Final    DG Chest Port 1 View  Result Date: 10/30/2021 CLINICAL DATA:  Chest x-ray ordered due to STEMI. EXAM: PORTABLE CHEST 1 VIEW COMPARISON:  None Available. FINDINGS: Multiple overlying monitor wires. There is mild cardiomegaly, mild central vascular prominence. There is no evidence of edema or focal lung infiltrate. No pleural effusion is seen. The mediastinum is normally outlined. No acute osseous abnormality. IMPRESSION: Mild cardiomegaly and mild central vascular distention. No  other evidence of acute chest process. Electronically Signed   By: Telford Nab M.D.   On: 10/30/2021 21:35   CARDIAC CATHETERIZATION  Result Date: 10/30/2021 Images from the original result were not included. Left Heart Catheterization 10/30/21: LV 191/2, EDP 12 mmHg.  Ao 156/98, mean 122 mmHg.  No pressure gradient across the aortic valve.  LVEF 40 to 45% with inferior akinesis. LM: Large caliber vessel.  Smooth and normal. LAD: Large caliber vessel.   Mid segment has a focal 50% stenosis.  Gives origin to very small D1 which is diffusely diseased, small to moderate size D2 which is diffusely diseased with a proximal diffuse 90% stenosis.  Mid to distal LAD has minimal disease. CX: Large caliber vessel in the proximal segment.  Mid segment has a 30% stenosis of the origin of small OM 2.  OM1 is very large with a focal 90% stenosis however severely tortuous.  OM 3 is small with ostial 99% stenosis and distal circumflex is severely diffusely diseased and small. RCA: Large caliber similar dominant vessel.  100% occlusion in the mid segment at the origin of RV branch. Successful PTCA and stenting with 2.75 x 32 mm Synergy XD DES, stenosis reduced from 100% to 0% with TIMI 0 to TIMI-3 flow. Recommendation: Aggressive risk modification is indicated.  OM 1 is a moderate large caliber vessel however severely tortuous and high risk for intervention.  D2 is small to moderate-sized, again diffusely diseased.  Dual antiplatelet therapy with aspirin and Brilinta for 1 year.    Cardiac Studies:  Left Heart Catheterization 10/30/21:  LV 191/2, EDP 12 mmHg.  Ao 156/98, mean 122 mmHg.  No pressure gradient across the aortic valve.  LVEF 40 to 45% with inferior akinesis. LM: Large caliber vessel.  Smooth and normal. LAD: Large caliber vessel.  Mid segment has a focal 50% stenosis.  Gives origin to very small D1 which is diffusely diseased, small to moderate size D2 which is diffusely diseased with a proximal diffuse 90% stenosis.  Mid to distal LAD has minimal disease. CX: Large caliber vessel in the proximal segment.  Mid segment has a 30% stenosis of the origin of small OM 2.  OM1 is very large with a focal 90% stenosis however severely tortuous.  OM 3 is small with ostial 99% stenosis and distal circumflex is severely diffusely diseased and small. RCA: Large caliber similar dominant vessel.  100% occlusion in the mid segment at the origin of RV branch. Successful PTCA  and stenting with 2.75 x 32 mm Synergy XD DES, stenosis reduced from 100% to 0% with TIMI 0 to TIMI-3 flow.   Recommendation: Aggressive risk modification is indicated.  OM 1 is a moderate large caliber vessel however severely tortuous and high risk for intervention.  D2 is small to moderate-sized, again diffusely diseased.  Dual antiplatelet therapy with aspirin and Brilinta for 1 year.      EKG 10/31/2021: sinus bradycardia, HR 51, old inferior infarct  No results found for this or any previous visit (from the past 43800 hour(s)).  Scheduled Meds:  aspirin  81 mg Oral Daily   atorvastatin  80 mg Oral Daily   empagliflozin  10 mg Oral Daily   insulin aspart  0-15 Units Subcutaneous TID WC   insulin aspart  0-5 Units Subcutaneous QHS   losartan  25 mg Oral QPM   metoprolol tartrate  25 mg Oral BID   sodium chloride flush  3 mL Intravenous Q12H   ticagrelor  90 mg Oral BID   Continuous Infusions:  sodium chloride     PRN Meds:.sodium chloride, acetaminophen, ondansetron (ZOFRAN) IV, mouth rinse, sodium chloride flush  Assessment  Garrett Roberts is a 66 y.o. male patient with   Inferior STEMI s/p DES to RCA HTN HLD NIDDM2  Plan:   Echo ordered, pending discharge EKG and telemetry reviewed Medications sent to pharmacy Continue ASA, Brillinta, Metoprolol, Losartan, Lipitor Ok to hold BB given sinus bradycardia    Floydene Flock, DO, [facility] 10/31/2021, 11:18 AM Office: [phone] Fax: [phone] Pager: [phone]

## 2021-10-31 NOTE — Care Management (Signed)
  Transition of Care [facility]) Screening Note   Patient Details  Name: Garrett Roberts Date of Birth: 11-Nov-1955   Transition of Care [facility]) CM/SW Contact:    Bethena Roys, RN Phone Number: 10/31/2021, 4:47 PM    Transition of Care Department [facility]) has reviewed the patient. Patient is without insurance at this time. Patient is being discharged on Jardiance and Brilinta. Jardiance 14 day free card and Brilinta 30 day free card utilized at [facility]. Patient will need to follow up with Dr. Einar Gip for additional samples or patient assistance in the office. No further needs identified at this time.

## 2021-10-31 NOTE — Progress Notes (Signed)
  Echocardiogram 2D Echocardiogram has been performed.  Garrett Roberts 10/31/2021, 4:28 PM

## 2021-10-31 NOTE — Progress Notes (Signed)
Okay to d/c home per Dr Einar Gip. Orders received to give Brilinta x 1 prior to d/c as his [facility] pharmacy is closed and he will not be able to fill Rx until tomorrow. Brilinta and Jardiance cards given to pt. Post cath Radial site care education given to pt. D/C to home with family via w/c to front entrance.

## 2021-10-31 NOTE — Discharge Summary (Signed)
Subjective:  Patient seen and examined at bedside, resting comfortably. No events overnight. Denies CP, SOB, palpitations, diaphoresis, syncope.  Intake/Output from previous day:  I/O last 3 completed shifts: In: 773.2 [I.V.:773.2] Out: 1800 [Urine:1800] Total I/O In: 79.4 [I.V.:79.4] Out: -  Net IO Since Admission: -845.37 mL [10/31/21 1118]  Blood pressure 133/68, pulse (!) 55, temperature 98.2 F (36.8 C), temperature source Oral, resp. rate 18, height $RemoveBe'5\' 9"'LPSNoJrjM$  (1.753 m), weight 79.4 kg, SpO2 99 %. Physical Exam Vitals reviewed.  Constitutional:      General: He is not in acute distress. Cardiovascular:     Rate and Rhythm: Regular rhythm. Bradycardia present.     Pulses: Normal pulses.     Heart sounds: Normal heart sounds. No murmur heard. Pulmonary:     Effort: Pulmonary effort is normal.     Breath sounds: Normal breath sounds.  Musculoskeletal:     Right lower leg: No edema.     Left lower leg: No edema.  Skin:    General: Skin is warm and dry.  Neurological:     Mental Status: He is alert.     Lab Results: Lab Results  Component Value Date   NA 136 10/31/2021   K 3.9 10/31/2021   CO2 23 10/31/2021   GLUCOSE 253 (H) 10/31/2021   BUN 14 10/31/2021   CREATININE 0.79 10/31/2021   CALCIUM 9.1 10/31/2021   GFRNONAA >60 10/31/2021    BNP (last 3 results) No results for input(s): "BNP" in the last 8760 hours.  ProBNP (last 3 results) No results for input(s): "PROBNP" in the last 8760 hours.    Latest Ref Rng & Units 10/31/2021    3:51 AM 10/30/2021    9:21 PM 10/30/2021    8:13 PM  BMP  Glucose 70 - 99 mg/dL 253  269  297   BUN 8 - 23 mg/dL $Remove'14  19  20   'VQKaHGd$ Creatinine 0.61 - 1.24 mg/dL 0.79  1.07  0.80   Sodium 135 - 145 mmol/L 136  135  134   Potassium 3.5 - 5.1 mmol/L 3.9  4.1  4.1   Chloride 98 - 111 mmol/L 104  98  99   CO2 22 - 32 mmol/L 23  26    Calcium 8.9 - 10.3 mg/dL 9.1  9.3        Latest Ref Rng & Units 10/30/2021    7:00 PM 09/25/2012     6:23 PM 04/14/2009   10:41 PM  Hepatic Function  Total Protein 6.5 - 8.1 g/dL 7.5  8.3  7.8   Albumin 3.5 - 5.0 g/dL 3.8  4.1  4.7   AST 15 - 41 U/L $Remo'13  16  17   'PLnHq$ ALT 0 - 44 U/L $Remo'13  12  18   'YmGkl$ Alk Phosphatase 38 - 126 U/L 64  89  66   Total Bilirubin 0.3 - 1.2 mg/dL 0.4  0.3  0.5       Latest Ref Rng & Units 10/31/2021    3:51 AM 10/30/2021    8:13 PM 10/30/2021    7:00 PM  CBC  WBC 4.0 - 10.5 K/uL 10.7   8.1   Hemoglobin 13.0 - 17.0 g/dL 15.2  16.3  15.7   Hematocrit 39.0 - 52.0 % 44.4  48.0  48.0   Platelets 150 - 400 K/uL 216   244    Lipid Panel     Component Value Date/Time   CHOL 192 10/30/2021 1900   TRIG  133 10/30/2021 1900   HDL 38 (L) 10/30/2021 1900   CHOLHDL 5.1 10/30/2021 1900   VLDL 27 10/30/2021 1900   LDLCALC 127 (H) 10/30/2021 1900   Cardiac Panel (last 3 results) No results for input(s): "CKTOTAL", "CKMB", "TROPONINI", "RELINDX" in the last 72 hours.  HEMOGLOBIN A1C Lab Results  Component Value Date   HGBA1C 10.4 (H) 10/30/2021   MPG 251.78 10/30/2021   TSH No results for input(s): "TSH" in the last 8760 hours. Imaging: Imaging results have been reviewed and ECHOCARDIOGRAM COMPLETE  Result Date: 10/31/2021    ECHOCARDIOGRAM REPORT   Patient Name:   Garrett Roberts Date of Exam: 10/31/2021 Medical Rec #:  [id number]            Height:       69.0 in Accession #:    [id number]           Weight:       175.0 lb Date of Birth:  [birth year]           BSA:          1.952 m Patient Age:    66 years             BP:           125/63 mmHg Patient Gender: M                    HR:           52 bpm. Exam Location:  Inpatient Procedure: 2D Echo, 3D Echo, Cardiac Doppler, Color Doppler and Intracardiac            Opacification Agent Indications:     Acute myocardial infarction, unspecified I21.9  History:         Patient has no prior history of Echocardiogram examinations.                  STEMI, Signs/Symptoms:Chest Pain; Risk Factors:Dyslipidemia,                   Diabetes, Hypertension and Non-Smoker.  Sonographer:     Greer Pickerel Referring Phys:  [id number] [facility] Jenan Ellegood Diagnosing Phys: Collene Mares Lindia Garms  Sonographer Comments: Image acquisition challenging due to respiratory motion. IMPRESSIONS  1. Left ventricular ejection fraction, by estimation, is 55 to 60%. Left ventricular ejection fraction by 3D volume is 58 %. Left ventricular ejection fraction by PLAX is 58 %. The left ventricle has normal function. Left ventricular endocardial border not optimally defined to evaluate regional wall motion. Left ventricular diastolic parameters are consistent with Grade I diastolic dysfunction (impaired relaxation). Elevated left atrial pressure. The E/e' is 14.5.  2. Right ventricular systolic function is normal. The right ventricular size is normal. There is normal pulmonary artery systolic pressure.  3. The mitral valve is normal in structure. No evidence of mitral valve regurgitation.  4. The aortic valve is normal in structure. Aortic valve regurgitation is not visualized. Conclusion(s)/Recommendation(s): Normal biventricular function without evidence of hemodynamically significant valvular heart disease. FINDINGS  Left Ventricle: Left ventricular ejection fraction, by estimation, is 55 to 60%. Left ventricular ejection fraction by PLAX is 58 %. Left ventricular ejection fraction by 3D volume is 58 %. The left ventricle has normal function. Left ventricular endocardial border not optimally defined to evaluate regional wall motion. Definity contrast agent was given IV to delineate the left ventricular endocardial borders. The left ventricular internal cavity size was normal in size. Suboptimal image quality limits for assessment of left  ventricular hypertrophy. Left ventricular diastolic parameters are consistent with Grade I diastolic dysfunction (impaired relaxation). Elevated left atrial pressure. The E/e' is 14.5. Right Ventricle: The right ventricular size is normal. No  increase in right ventricular wall thickness. Right ventricular systolic function is normal. There is normal pulmonary artery systolic pressure. The tricuspid regurgitant velocity is 1.30 m/s, and  with an assumed right atrial pressure of 3 mmHg, the estimated right ventricular systolic pressure is 9.8 mmHg. Left Atrium: Left atrial size was normal in size. Right Atrium: Right atrial size was normal in size. Pericardium: There is no evidence of pericardial effusion. Mitral Valve: The mitral valve is normal in structure. No evidence of mitral valve regurgitation. Tricuspid Valve: The tricuspid valve is normal in structure. Tricuspid valve regurgitation is not demonstrated. Aortic Valve: The aortic valve is normal in structure. Aortic valve regurgitation is not visualized. Pulmonic Valve: The pulmonic valve was normal in structure. Pulmonic valve regurgitation is not visualized. Aorta: The aortic root is normal in size and structure. IAS/Shunts: No atrial level shunt detected by color flow Doppler.  LEFT VENTRICLE PLAX 2D LV EF:         Left            Diastology                ventricular     LV e' medial:    5.27 cm/s                ejection        LV E/e' medial:  17.3                fraction by     LV e' lateral:   7.54 cm/s                PLAX is 58      LV E/e' lateral: 12.1                %. LVIDd:         4.90 cm LVIDs:         3.40 cm         3D Volume EF LV PW:         0.80 cm         LV 3D EF:    Left LV IVS:        1.20 cm                      ventricul LVOT diam:     2.00 cm                      ar LV SV:         67                           ejection LV SV Index:   34                           fraction LVOT Area:     3.14 cm                     by 3D  volume is                                             58 %.                                 3D Volume EF:                                3D EF:        58 %                                LV EDV:       178 ml                                 LV ESV:       76 ml                                LV SV:        103 ml RIGHT VENTRICLE RV S prime:     15.30 cm/s TAPSE (M-mode): 2.0 cm LEFT ATRIUM             Index        RIGHT ATRIUM           Index LA diam:        3.70 cm 1.90 cm/m   RA Area:     21.20 cm LA Vol (A2C):   43.6 ml 22.34 ml/m  RA Volume:   64.10 ml  32.84 ml/m LA Vol (A4C):   56.8 ml 29.10 ml/m LA Biplane Vol: 50.6 ml 25.92 ml/m  AORTIC VALVE LVOT Vmax:   88.60 cm/s LVOT Vmean:  56.800 cm/s LVOT VTI:    0.214 m  AORTA Ao Root diam: 3.80 cm Ao Asc diam:  4.05 cm MITRAL VALVE                TRICUSPID VALVE MV Area (PHT): 3.12 cm     TR Peak grad:   6.8 mmHg MV Decel Time: 243 msec     TR Vmax:        130.00 cm/s MV E velocity: 91.30 cm/s MV A velocity: 106.00 cm/s  SHUNTS MV E/A ratio:  0.86         Systemic VTI:  0.21 m                             Systemic Diam: 2.00 cm Collene Mares Nicholis Stepanek Electronically signed by Floydene Flock Signature Date/Time: 10/31/2021/3:41:50 PM    Final    DG Chest Port 1 View  Result Date: 10/30/2021 CLINICAL DATA:  Chest x-ray ordered due to STEMI. EXAM: PORTABLE CHEST 1 VIEW COMPARISON:  None Available. FINDINGS: Multiple overlying monitor wires. There is mild cardiomegaly, mild central vascular prominence. There is no evidence of edema or focal lung infiltrate. No pleural effusion is seen. The mediastinum is normally outlined. No acute osseous abnormality. IMPRESSION: Mild cardiomegaly and mild central vascular distention. No  other evidence of acute chest process. Electronically Signed   By: Telford Nab M.D.   On: 10/30/2021 21:35   CARDIAC CATHETERIZATION  Result Date: 10/30/2021 Images from the original result were not included. Left Heart Catheterization 10/30/21: LV 191/2, EDP 12 mmHg.  Ao 156/98, mean 122 mmHg.  No pressure gradient across the aortic valve.  LVEF 40 to 45% with inferior akinesis. LM: Large caliber vessel.  Smooth and normal. LAD: Large caliber vessel.   Mid segment has a focal 50% stenosis.  Gives origin to very small D1 which is diffusely diseased, small to moderate size D2 which is diffusely diseased with a proximal diffuse 90% stenosis.  Mid to distal LAD has minimal disease. CX: Large caliber vessel in the proximal segment.  Mid segment has a 30% stenosis of the origin of small OM 2.  OM1 is very large with a focal 90% stenosis however severely tortuous.  OM 3 is small with ostial 99% stenosis and distal circumflex is severely diffusely diseased and small. RCA: Large caliber similar dominant vessel.  100% occlusion in the mid segment at the origin of RV branch. Successful PTCA and stenting with 2.75 x 32 mm Synergy XD DES, stenosis reduced from 100% to 0% with TIMI 0 to TIMI-3 flow. Recommendation: Aggressive risk modification is indicated.  OM 1 is a moderate large caliber vessel however severely tortuous and high risk for intervention.  D2 is small to moderate-sized, again diffusely diseased.  Dual antiplatelet therapy with aspirin and Brilinta for 1 year.    Cardiac Studies:  Left Heart Catheterization 10/30/21:  LV 191/2, EDP 12 mmHg.  Ao 156/98, mean 122 mmHg.  No pressure gradient across the aortic valve.  LVEF 40 to 45% with inferior akinesis. LM: Large caliber vessel.  Smooth and normal. LAD: Large caliber vessel.  Mid segment has a focal 50% stenosis.  Gives origin to very small D1 which is diffusely diseased, small to moderate size D2 which is diffusely diseased with a proximal diffuse 90% stenosis.  Mid to distal LAD has minimal disease. CX: Large caliber vessel in the proximal segment.  Mid segment has a 30% stenosis of the origin of small OM 2.  OM1 is very large with a focal 90% stenosis however severely tortuous.  OM 3 is small with ostial 99% stenosis and distal circumflex is severely diffusely diseased and small. RCA: Large caliber similar dominant vessel.  100% occlusion in the mid segment at the origin of RV branch. Successful PTCA  and stenting with 2.75 x 32 mm Synergy XD DES, stenosis reduced from 100% to 0% with TIMI 0 to TIMI-3 flow.   Recommendation: Aggressive risk modification is indicated.  OM 1 is a moderate large caliber vessel however severely tortuous and high risk for intervention.  D2 is small to moderate-sized, again diffusely diseased.  Dual antiplatelet therapy with aspirin and Brilinta for 1 year.      EKG 10/31/2021: sinus bradycardia, HR 51, old inferior infarct  No results found for this or any previous visit (from the past 43800 hour(s)).  Scheduled Meds:  aspirin  81 mg Oral Daily   atorvastatin  80 mg Oral Daily   empagliflozin  10 mg Oral Daily   insulin aspart  0-15 Units Subcutaneous TID WC   insulin aspart  0-5 Units Subcutaneous QHS   losartan  25 mg Oral QPM   metoprolol tartrate  25 mg Oral BID   sodium chloride flush  3 mL Intravenous Q12H   ticagrelor  90 mg Oral BID   Continuous Infusions:  sodium chloride     PRN Meds:.sodium chloride, acetaminophen, ondansetron (ZOFRAN) IV, mouth rinse, sodium chloride flush  Assessment  Garrett Roberts is a 66 y.o. male patient with   Inferior STEMI s/p DES to RCA HTN HLD NIDDM2  Plan:   Echo ordered, pending discharge EKG and telemetry reviewed Medications sent to pharmacy Continue ASA, Brillinta, Metoprolol, Losartan, Lipitor Ok to hold BB given sinus bradycardia    Floydene Flock, DO, [facility] 10/31/2021, 11:18 AM Office: [phone] Fax: [phone] Pager: [phone] Physician Discharge Summary  Patient ID: Garrett Roberts MRN: [id number] DOB/AGE: May 15, 1955 66 y.o. Chesley Noon, MD   Admit date: 10/30/2021 Discharge date: 10/31/2021  Primary Discharge Diagnosis Inferior STEMI sp DES to RCA   Significant Diagnostic Studies: IMPRESSIONS TTE 10/31/2021     1. Left ventricular ejection fraction, by estimation, is 55 to 60%. Left  ventricular ejection fraction by 3D volume is 58 %. Left  ventricular  ejection fraction by PLAX is 58 %. The left ventricle has normal function.  Left ventricular endocardial border  not optimally defined to evaluate regional wall motion. Left ventricular  diastolic parameters are consistent with Grade I diastolic dysfunction  (impaired relaxation). Elevated left atrial pressure. The E/e' is 14.5.   2. Right ventricular systolic function is normal. The right ventricular  size is normal. There is normal pulmonary artery systolic pressure.   3. The mitral valve is normal in structure. No evidence of mitral valve  regurgitation.   4. The aortic valve is normal in structure. Aortic valve regurgitation is  not visualized.    Radiology: ECHOCARDIOGRAM COMPLETE  Result Date: 10/31/2021    ECHOCARDIOGRAM REPORT   Patient Name:   Garrett Roberts Date of Exam: 10/31/2021 Medical Rec #:  [id number]            Height:       69.0 in Accession #:    [id number]           Weight:       175.0 lb Date of Birth:  10/10/1955           BSA:          1.952 m Patient Age:    27 years             BP:           125/63 mmHg Patient Gender: M                    HR:           52 bpm. Exam Location:  Inpatient Procedure: 2D Echo, 3D Echo, Cardiac Doppler, Color Doppler and Intracardiac            Opacification Agent Indications:     Acute myocardial infarction, unspecified I21.9  History:         Patient has no prior history of Echocardiogram examinations.                  STEMI, Signs/Symptoms:Chest Pain; Risk Factors:Dyslipidemia,                  Diabetes, Hypertension and Non-Smoker.  Sonographer:     Greer Pickerel Referring Phys:  [id number] [facility] Karilyn Wind Diagnosing Phys: Collene Mares Bertrum Helmstetter  Sonographer Comments: Image acquisition challenging due to respiratory motion. IMPRESSIONS  1. Left ventricular ejection fraction, by estimation, is 55 to 60%. Left ventricular ejection fraction by 3D volume is 58 %. Left ventricular ejection  fraction by PLAX is 58 %. The left ventricle has  normal function. Left ventricular endocardial border not optimally defined to evaluate regional wall motion. Left ventricular diastolic parameters are consistent with Grade I diastolic dysfunction (impaired relaxation). Elevated left atrial pressure. The E/e' is 14.5.  2. Right ventricular systolic function is normal. The right ventricular size is normal. There is normal pulmonary artery systolic pressure.  3. The mitral valve is normal in structure. No evidence of mitral valve regurgitation.  4. The aortic valve is normal in structure. Aortic valve regurgitation is not visualized. Conclusion(s)/Recommendation(s): Normal biventricular function without evidence of hemodynamically significant valvular heart disease. FINDINGS  Left Ventricle: Left ventricular ejection fraction, by estimation, is 55 to 60%. Left ventricular ejection fraction by PLAX is 58 %. Left ventricular ejection fraction by 3D volume is 58 %. The left ventricle has normal function. Left ventricular endocardial border not optimally defined to evaluate regional wall motion. Definity contrast agent was given IV to delineate the left ventricular endocardial borders. The left ventricular internal cavity size was normal in size. Suboptimal image quality limits for assessment of left ventricular hypertrophy. Left ventricular diastolic parameters are consistent with Grade I diastolic dysfunction (impaired relaxation). Elevated left atrial pressure. The E/e' is 14.5. Right Ventricle: The right ventricular size is normal. No increase in right ventricular wall thickness. Right ventricular systolic function is normal. There is normal pulmonary artery systolic pressure. The tricuspid regurgitant velocity is 1.30 m/s, and  with an assumed right atrial pressure of 3 mmHg, the estimated right ventricular systolic pressure is 9.8 mmHg. Left Atrium: Left atrial size was normal in size. Right Atrium: Right atrial size was normal in size. Pericardium: There is no  evidence of pericardial effusion. Mitral Valve: The mitral valve is normal in structure. No evidence of mitral valve regurgitation. Tricuspid Valve: The tricuspid valve is normal in structure. Tricuspid valve regurgitation is not demonstrated. Aortic Valve: The aortic valve is normal in structure. Aortic valve regurgitation is not visualized. Pulmonic Valve: The pulmonic valve was normal in structure. Pulmonic valve regurgitation is not visualized. Aorta: The aortic root is normal in size and structure. IAS/Shunts: No atrial level shunt detected by color flow Doppler.  LEFT VENTRICLE PLAX 2D LV EF:         Left            Diastology                ventricular     LV e' medial:    5.27 cm/s                ejection        LV E/e' medial:  17.3                fraction by     LV e' lateral:   7.54 cm/s                PLAX is 58      LV E/e' lateral: 12.1                %. LVIDd:         4.90 cm LVIDs:         3.40 cm         3D Volume EF LV PW:         0.80 cm         LV 3D EF:    Left LV IVS:  1.20 cm                      ventricul LVOT diam:     2.00 cm                      ar LV SV:         67                           ejection LV SV Index:   34                           fraction LVOT Area:     3.14 cm                     by 3D                                             volume is                                             58 %.                                 3D Volume EF:                                3D EF:        58 %                                LV EDV:       178 ml                                LV ESV:       76 ml                                LV SV:        103 ml RIGHT VENTRICLE RV S prime:     15.30 cm/s TAPSE (M-mode): 2.0 cm LEFT ATRIUM             Index        RIGHT ATRIUM           Index LA diam:        3.70 cm 1.90 cm/m   RA Area:     21.20 cm LA Vol (A2C):   43.6 ml 22.34 ml/m  RA Volume:   64.10 ml  32.84 ml/m LA Vol (A4C):   56.8 ml 29.10 ml/m LA Biplane Vol: 50.6 ml 25.92 ml/m   AORTIC VALVE LVOT Vmax:   88.60 cm/s LVOT Vmean:  56.800 cm/s LVOT VTI:    0.214 m  AORTA Ao Root diam: 3.80 cm Ao Asc diam:  4.05 cm MITRAL VALVE  TRICUSPID VALVE MV Area (PHT): 3.12 cm     TR Peak grad:   6.8 mmHg MV Decel Time: 243 msec     TR Vmax:        130.00 cm/s MV E velocity: 91.30 cm/s MV A velocity: 106.00 cm/s  SHUNTS MV E/A ratio:  0.86         Systemic VTI:  0.21 m                             Systemic Diam: 2.00 cm Collene Mares Joesphine Schemm Electronically signed by Floydene Flock Signature Date/Time: 10/31/2021/3:41:50 PM    Final    DG Chest Port 1 View  Result Date: 10/30/2021 CLINICAL DATA:  Chest x-ray ordered due to STEMI. EXAM: PORTABLE CHEST 1 VIEW COMPARISON:  None Available. FINDINGS: Multiple overlying monitor wires. There is mild cardiomegaly, mild central vascular prominence. There is no evidence of edema or focal lung infiltrate. No pleural effusion is seen. The mediastinum is normally outlined. No acute osseous abnormality. IMPRESSION: Mild cardiomegaly and mild central vascular distention. No other evidence of acute chest process. Electronically Signed   By: Telford Nab M.D.   On: 10/30/2021 21:35   CARDIAC CATHETERIZATION  Result Date: 10/30/2021 Images from the original result were not included. Left Heart Catheterization 10/30/21: LV 191/2, EDP 12 mmHg.  Ao 156/98, mean 122 mmHg.  No pressure gradient across the aortic valve.  LVEF 40 to 45% with inferior akinesis. LM: Large caliber vessel.  Smooth and normal. LAD: Large caliber vessel.  Mid segment has a focal 50% stenosis.  Gives origin to very small D1 which is diffusely diseased, small to moderate size D2 which is diffusely diseased with a proximal diffuse 90% stenosis.  Mid to distal LAD has minimal disease. CX: Large caliber vessel in the proximal segment.  Mid segment has a 30% stenosis of the origin of small OM 2.  OM1 is very large with a focal 90% stenosis however severely tortuous.  OM 3 is small with  ostial 99% stenosis and distal circumflex is severely diffusely diseased and small. RCA: Large caliber similar dominant vessel.  100% occlusion in the mid segment at the origin of RV branch. Successful PTCA and stenting with 2.75 x 32 mm Synergy XD DES, stenosis reduced from 100% to 0% with TIMI 0 to TIMI-3 flow. Recommendation: Aggressive risk modification is indicated.  OM 1 is a moderate large caliber vessel however severely tortuous and high risk for intervention.  D2 is small to moderate-sized, again diffusely diseased.  Dual antiplatelet therapy with aspirin and Brilinta for 1 year.    Hospital Course: Habib Kise is a 66 y.o. male  patient who presented to the ED with inferior STEMI sp DES to RCA by Dr Einar Gip.  Recommendations on discharge: Follow-up with Dr Einar Gip in 1-2 weeks. Continue current cardiac meds: ASA and brillinta, Losartan. Metoprolol, Lipitor. Do not return to work until cleared by Dr Einar Gip.  Discharge Exam:    10/31/2021    1:16 PM 10/31/2021   12:00 PM 10/31/2021   11:00 AM  Vitals with BMI  Systolic 591 638 466  Diastolic 63 66 82  Pulse 54 54 55     Physical Exam  Labs:   Lab Results  Component Value Date   WBC 10.7 (H) 10/31/2021   HGB 15.2 10/31/2021   HCT 44.4 10/31/2021   MCV 86.4 10/31/2021   PLT 216 10/31/2021    Recent Labs  Lab 10/30/21 1900 10/30/21 2013 10/31/21 0351  NA 134*   < > 136  K 3.9   < > 3.9  CL 98   < > 104  CO2 26   < > 23  BUN 20   < > 14  CREATININE 1.12   < > 0.79  CALCIUM 9.4   < > 9.1  PROT 7.5  --   --   BILITOT 0.4  --   --   ALKPHOS 64  --   --   ALT 13  --   --   AST 13*  --   --   GLUCOSE 273*   < > 253*   < > = values in this interval not displayed.    Lipid Panel     Component Value Date/Time   CHOL 192 10/30/2021 1900   TRIG 133 10/30/2021 1900   HDL 38 (L) 10/30/2021 1900   CHOLHDL 5.1 10/30/2021 1900   VLDL 27 10/30/2021 1900   LDLCALC 127 (H) 10/30/2021 1900    BNP (last 3  results) No results for input(s): "BNP" in the last 8760 hours.  HEMOGLOBIN A1C Lab Results  Component Value Date   HGBA1C 10.4 (H) 10/30/2021   MPG 251.78 10/30/2021    Cardiac Panel (last 3 results) Recent Labs    10/30/21 1900 10/30/21 2121  TROPONINIHS 9 8,096*     TSH No results for input(s): "TSH" in the last 8760 hours.  FOLLOW UP PLANS AND APPOINTMENTS Discharge Instructions     AMB Referral to Cardiac Rehabilitation - Phase II   Complete by: As directed    Diagnosis:  STEMI Coronary Stents     After initial evaluation and assessments completed: Virtual Based Care may be provided alone or in conjunction with Phase 2 Cardiac Rehab based on patient barriers.: Yes   Intensive Cardiac Rehabilitation (ICR) [place] location only OR Traditional Cardiac Rehabilitation (TCR) *If criteria for ICR are not met will enroll in TCR [facility] only): Yes      Allergies as of 10/31/2021   No Known Allergies      Medication List     TAKE these medications    aspirin 81 MG chewable tablet Chew 1 tablet (81 mg total) by mouth daily. Start taking on: November 01, 2021   atorvastatin 80 MG tablet Commonly known as: LIPITOR Take 1 tablet (80 mg total) by mouth at bedtime.   empagliflozin 10 MG Tabs tablet Commonly known as: JARDIANCE Take 1 tablet (10 mg total) by mouth daily. Start taking on: November 01, 2021   losartan 25 MG tablet Commonly known as: COZAAR Take 1 tablet (25 mg total) by mouth every evening.   metFORMIN 1000 MG tablet Commonly known as: GLUCOPHAGE Take 1,000 mg by mouth See admin instructions. 1000 mg every 2-3 days   metoprolol tartrate 25 MG tablet Commonly known as: LOPRESSOR Take 0.5 tablets (12.5 mg total) by mouth 2 (two) times daily.   OMEGA-3 PO Take 1 capsule by mouth daily.   OVER THE COUNTER MEDICATION Take 1 tablet by mouth daily. Potassium supplement, unknown strength   OVER THE COUNTER MEDICATION Take 1 tablet by mouth daily. Vitamin  C, unknown strength   ticagrelor 90 MG Tabs tablet Commonly known as: BRILINTA Take 1 tablet (90 mg total) by mouth 2 (two) times daily.   ZINC PO Take 1 tablet by mouth daily.          Floydene Flock, DO, [facility] 10/31/2021, 3:47 PM Office: [phone]

## 2021-11-01 ENCOUNTER — Encounter (HOSPITAL_COMMUNITY): Payer: Self-pay | Admitting: Cardiology

## 2021-11-01 MED FILL — Verapamil HCl IV Soln 2.5 MG/ML: INTRAVENOUS | Qty: 2 | Status: AC

## 2021-11-02 ENCOUNTER — Telehealth (HOSPITAL_COMMUNITY): Payer: Self-pay

## 2021-11-02 ENCOUNTER — Other Ambulatory Visit (HOSPITAL_COMMUNITY): Payer: Self-pay

## 2021-11-02 DIAGNOSIS — Z955 Presence of coronary angioplasty implant and graft: Secondary | ICD-10-CM

## 2021-11-02 DIAGNOSIS — I2119 ST elevation (STEMI) myocardial infarction involving other coronary artery of inferior wall: Secondary | ICD-10-CM

## 2021-11-02 LAB — LIPOPROTEIN A (LPA): Lipoprotein (a): 26 nmol/L (ref <75.0)

## 2021-11-02 NOTE — Telephone Encounter (Signed)
Attempted to educate pt on the phone bc he was d/c before he could be seen. When I called his work cell, somebody picked up and claimed they were not Mr Pennella and I hung up. I then called his home number and I never received an answer.

## 2021-11-03 NOTE — Progress Notes (Signed)
Sending materials to home address and referral sent to [facility] at [facility].

## 2021-11-08 ENCOUNTER — Ambulatory Visit: Payer: Self-pay

## 2021-11-08 VITALS — BP 146/72 | HR 71 | Temp 98.4°F | Resp 16 | Ht 69.0 in | Wt 174.2 lb

## 2021-11-08 DIAGNOSIS — E782 Mixed hyperlipidemia: Secondary | ICD-10-CM

## 2021-11-08 DIAGNOSIS — I2510 Atherosclerotic heart disease of native coronary artery without angina pectoris: Secondary | ICD-10-CM

## 2021-11-08 DIAGNOSIS — I10 Essential (primary) hypertension: Secondary | ICD-10-CM

## 2021-11-08 NOTE — Progress Notes (Signed)
Primary Physician/Referring:  Chesley Noon, MD  Patient ID: Garrett Roberts, male    DOB: 1955-03-26, 66 y.o.   MRN: [id number]  Chief Complaint  Patient presents with   Hospitalization Follow-up   Acute ST elevation myocardial infarction    HPI:    Garrett Roberts  is a 66 y.o. male patient with diabetes mellitus, hyperlipidemia, hypertension, non-smoker, acute inferior STEMI with EKG revealing ST elevation in the inferior leads with Mobitz 1 AV block status post successful cardiac stenting on 10/30/2021.  He presents today for follow-up after left heart cath and to establish care with cardiology. Overall, he is feeling well without complaints. He is walking 30 minutes daily and working on improving his diet. He denies chest pain, shortness of breath, palpitations, leg edema, orthopnea, PND, TIA/syncope.  Past Medical History:  Diagnosis Date   Coronary artery disease    Diabetes mellitus without complication ([place])    Hypertension    Past Surgical History:  Procedure Laterality Date   CORONARY/GRAFT ACUTE MI REVASCULARIZATION N/A 10/30/2021   Procedure: Coronary/Graft Acute MI Revascularization;  Surgeon: Adrian Prows, MD;  Location: [facility];  Service: Cardiovascular;  Laterality: N/A;   LEFT HEART CATH AND CORONARY ANGIOGRAPHY N/A 10/30/2021   Procedure: LEFT HEART CATH AND CORONARY ANGIOGRAPHY;  Surgeon: Adrian Prows, MD;  Location: [facility];  Service: Cardiovascular;  Laterality: N/A;   Family History  Problem Relation Age of Onset   Diabetes Mother    Chronic Renal Failure Father    Chronic Renal Failure Brother     Social History   Tobacco Use   Smoking status: Never   Smokeless tobacco: Not on file  Substance Use Topics   Alcohol use: No   Marital Status: Married  ROS  Review of Systems  Cardiovascular:  Negative for chest pain, dyspnea on exertion, leg swelling and syncope.  Respiratory:  Negative for shortness of breath.    Gastrointestinal:  Negative for melena.  Neurological:  Negative for dizziness and light-headedness.   Objective  Blood pressure (!) 146/72, pulse 71, temperature 98.4 F (36.9 C), temperature source Temporal, resp. rate 16, height 5\' 9"  (1.753 m), weight 174 lb 3.2 oz (79 kg), SpO2 98 %. Body mass index is 25.72 kg/m.     11/08/2021    9:46 AM 11/08/2021    9:32 AM 10/31/2021    1:16 PM  Vitals with BMI  Height  5\' 9"    Weight  174 lbs 3 oz   BMI  78.29   Systolic 562 130 865  Diastolic 72 75 63  Pulse 71 70 54    Physical Exam Cardiovascular:     Rate and Rhythm: Normal rate and regular rhythm.     Pulses: Normal pulses.          Radial pulses are 2+ on the left side.     Heart sounds: Normal heart sounds. No murmur heard.    No gallop.     Comments: Right radial catherization site has healed. No ecchymosis, redness, warmth, edema, or hematoma noted. Pulmonary:     Effort: Pulmonary effort is normal.     Breath sounds: Normal breath sounds. No wheezing or rales.  Abdominal:     General: Bowel sounds are normal.     Palpations: Abdomen is soft.     Tenderness: There is no abdominal tenderness.  Musculoskeletal:     Right lower leg: No edema.     Left lower leg: No edema.  Neurological:     Mental Status: He is alert.    Medications and allergies  No Known Allergies   Medication list after today's encounter   Current Outpatient Medications:    aspirin 81 MG chewable tablet, Chew 1 tablet (81 mg total) by mouth daily., Disp: 30 tablet, Rfl: 6   atorvastatin (LIPITOR) 80 MG tablet, Take 1 tablet (80 mg total) by mouth at bedtime., Disp: 30 tablet, Rfl: 6   empagliflozin (JARDIANCE) 10 MG TABS tablet, Take 1 tablet (10 mg total) by mouth daily., Disp: 30 tablet, Rfl: 6   losartan (COZAAR) 25 MG tablet, Take 1 tablet (25 mg total) by mouth every evening., Disp: 30 tablet, Rfl: 6   metFORMIN (GLUCOPHAGE) 1000 MG tablet, Take 1,000 mg by mouth See admin instructions.  1000 mg every 2-3 days, Disp: , Rfl:    metoprolol tartrate (LOPRESSOR) 25 MG tablet, Take 0.5 tablets (12.5 mg total) by mouth 2 (two) times daily., Disp: 60 tablet, Rfl: 6   Multiple Vitamins-Minerals (ZINC PO), Take 1 tablet by mouth daily., Disp: , Rfl:    Omega-3 Fatty Acids (OMEGA-3 PO), Take 1 capsule by mouth daily., Disp: , Rfl:    OVER THE COUNTER MEDICATION, Take 1 tablet by mouth daily. Potassium supplement, unknown strength, Disp: , Rfl:    OVER THE COUNTER MEDICATION, Take 1 tablet by mouth daily. Vitamin C, unknown strength, Disp: , Rfl:    ticagrelor (BRILINTA) 90 MG TABS tablet, Take 1 tablet (90 mg total) by mouth 2 (two) times daily., Disp: 60 tablet, Rfl: 6  Laboratory examination:   Lab Results  Component Value Date   NA 136 10/31/2021   K 3.9 10/31/2021   CO2 23 10/31/2021   GLUCOSE 253 (H) 10/31/2021   BUN 14 10/31/2021   CREATININE 0.79 10/31/2021   CALCIUM 9.1 10/31/2021   GFRNONAA >60 10/31/2021       Latest Ref Rng & Units 10/31/2021    3:51 AM 10/30/2021    9:21 PM 10/30/2021    8:13 PM  CMP  Glucose 70 - 99 mg/dL 625  638  937   BUN 8 - 23 mg/dL 14  19  20    Creatinine 0.61 - 1.24 mg/dL  3.42  8.76   Sodium 135 - 145 mmol/L 136  135  134   Potassium 3.5 - 5.1 mmol/L 3.9  4.1  4.1   Chloride 98 - 111 mmol/L 104  98  99   CO2 22 - 32 mmol/L 23  26    Calcium 8.9 - 10.3 mg/dL 9.1  9.3        Latest Ref Rng & Units 10/31/2021    3:51 AM 10/30/2021    8:13 PM 10/30/2021    7:00 PM  CBC  WBC 4.0 - 10.5 K/uL 10.7   8.1   Hemoglobin 13.0 - 17.0 g/dL 11/01/2021  57.2  62.0   Hematocrit 39.0 - 52.0 % 44.4  48.0  48.0   Platelets 150 - 400 K/uL 216   244     Lipid Panel Recent Labs    10/30/21 1900  CHOL 192  TRIG 133  LDLCALC 127*  VLDL 27  HDL 38*  CHOLHDL 5.1    HEMOGLOBIN A1C Lab Results  Component Value Date   HGBA1C 10.4 (H) 10/30/2021   MPG 251.78 10/30/2021   TSH No results for input(s): "TSH" in the last 8760  hours.  External labs:    Radiology:   Chest Xray 10/30/2021: FINDINGS:  Multiple overlying monitor wires. There is mild cardiomegaly, mild central vascular prominence. There is no evidence of edema or focal lung infiltrate. No pleural effusion is seen. The mediastinum is normally outlined. No acute osseous abnormality.   IMPRESSION: Mild cardiomegaly and mild central vascular distention. No other evidence of acute chest process.  Cardiac Studies:   Left Heart Catheterization 10/30/21:  RCA: Large caliber similar dominant vessel.  100% occlusion in the mid segment at the origin of RV branch. Successful PTCA and stenting with 2.75 x 32 mm Synergy XD DES, stenosis reduced from 100% to 0% with TIMI 0 to TIMI-3 flow.  Recommendation: Aggressive risk modification is indicated.  OM 1 is a moderate large caliber vessel however severely tortuous and high risk for intervention.  D2 is small to moderate-sized, again diffusely diseased.  Dual antiplatelet therapy with aspirin and Brilinta for 1 year.    Echocardiogram 10/31/2021:  1. Left ventricular ejection fraction, by estimation, is 55 to 60%. Left ventricular ejection fraction by 3D volume is 58 %. Left ventricular ejection fraction by PLAX is 58 %. The left ventricle has normal function. Left ventricular endocardial border  not optimally defined to evaluate regional wall motion. Left ventricular diastolic parameters are consistent with Grade I diastolic dysfunction (impaired relaxation). Elevated left atrial pressure. The E/e' is 14.5.  2. Right ventricular systolic function is normal. The right ventricular size is normal. There is normal pulmonary artery systolic pressure.  3. The mitral valve is normal in structure. No evidence of mitral valve regurgitation.  4. The aortic valve is normal in structure. Aortic valve regurgitation is not visualized.  EKG:   EKG 11/08/2021: Normal sinus rhythm at 72 bpm.  No axis deviation.  Left atrial  normal.  Diffuse nonspecific T wave abnormality.  Assessment     ICD-10-CM   1. Coronary artery disease involving native coronary artery of native heart without angina pectoris  I25.10 EKG 12-Lead    2. Primary hypertension  I10     3. Mixed hyperlipidemia  E78.2        Orders Placed This Encounter  Procedures   EKG 12-Lead    No orders of the defined types were placed in this encounter.   There are no discontinued medications.   Recommendations:   Garrett Roberts is a 66 y.o.  male patient with diabetes mellitus, hyperlipidemia, hypertension, non-smoker, acute inferior STEMI with EKG revealing ST elevation in the inferior leads with Mobitz 1 AV block status post successful cardiac stenting on 10/30/2021.  Coronary artery disease involving native coronary artery of native heart without angina pectoris Successful PTCA and stenting to RCA with 2.75 x 32 mm Synergy XD DES, stenosis reduced from 100% to 0% with TIMI 0 to TIMI-3 flow on 10/30/2021. No recurrence of chest pain. Tolerating ticagrelor and aspirin without bleeding diathesis. Will continue for 1 year. Right radial catheterization site is healed. He is exercising daily, walking 30 minutes. He would like to increase his activity level as he was previously walking 2 miles daily. I have encouraged him to increase activity as tolerated. He has been referred to cardiac rehab. We had extensive discussion regarding lifestyle modifications, diet, and exercise.  Primary hypertension Pressure slightly elevated at today's visit.  He does monitor his blood pressure at home and readings have been 130s/70s. Continue current medications without changes. Discussed low-sodium diet less than 1500 mg daily.  Mixed hyperlipidemia Recent recent labs. Continue atorvastatin 80 mg daily.  Goal LDL <70. We will recheck lipid profile  in 6 months.  Follow-up in 3 months or sooner if needed.   Nori Riis, AGNP-C  11/08/2021,  10:21 AM Office: [phone] Pager: [phone]

## 2021-12-22 ENCOUNTER — Other Ambulatory Visit: Payer: Self-pay

## 2021-12-22 MED ORDER — EMPAGLIFLOZIN 10 MG PO TABS: 10.0000 mg | ORAL_TABLET | Freq: Every day | ORAL | 3 refills | Status: DC

## 2021-12-23 ENCOUNTER — Telehealth: Payer: Self-pay

## 2021-12-23 NOTE — Telephone Encounter (Signed)
Patient assistance forms have been faxed for [facility]

## 2022-02-08 ENCOUNTER — Encounter: Payer: Self-pay | Admitting: Internal Medicine

## 2022-02-08 ENCOUNTER — Ambulatory Visit: Payer: Self-pay

## 2022-02-08 ENCOUNTER — Ambulatory Visit: Payer: Self-pay | Admitting: Internal Medicine

## 2022-02-08 VITALS — BP 140/73 | HR 64 | Ht 69.0 in | Wt 177.8 lb

## 2022-02-08 DIAGNOSIS — E782 Mixed hyperlipidemia: Secondary | ICD-10-CM

## 2022-02-08 DIAGNOSIS — I10 Essential (primary) hypertension: Secondary | ICD-10-CM

## 2022-02-08 DIAGNOSIS — I2510 Atherosclerotic heart disease of native coronary artery without angina pectoris: Secondary | ICD-10-CM

## 2022-02-08 NOTE — Progress Notes (Signed)
Primary Physician/Referring:  Chesley Noon, MD  Patient ID: Garrett Roberts, male    DOB: Sep 07, 1955, 67 y.o.   MRN: [id number]  Chief Complaint  Patient presents with   Coronary Artery Disease   Follow-up   Hypertension   HPI:    Garrett Roberts  is a 67 y.o. male patient with diabetes mellitus, hyperlipidemia, hypertension, non-smoker, acute inferior STEMI with EKG revealing ST elevation in the inferior leads with Mobitz 1 AV block status post successful cardiac stenting on 10/30/2021.  Patient is here for a follow-up visit. He has been doing well since the last time he was here but he does admit that he has significant stress in his life. His family is in [place] and trying to escape the war and he worries about them all of the time. Patient has been in [place] so over 20 years now and he has not seen his mom in a very long time. From a cardiac standpoint, he has been doing well and he is tolerating his medications without issues. He is walking 30 minutes daily and working on improving his diet. He denies chest pain, shortness of breath, palpitations, leg edema, orthopnea, PND, TIA/syncope.  Past Medical History:  Diagnosis Date   Coronary artery disease    Diabetes mellitus without complication ([place])    Hypertension    Past Surgical History:  Procedure Laterality Date   CORONARY/GRAFT ACUTE MI REVASCULARIZATION N/A 10/30/2021   Procedure: Coronary/Graft Acute MI Revascularization;  Surgeon: Adrian Prows, MD;  Location: [facility];  Service: Cardiovascular;  Laterality: N/A;   LEFT HEART CATH AND CORONARY ANGIOGRAPHY N/A 10/30/2021   Procedure: LEFT HEART CATH AND CORONARY ANGIOGRAPHY;  Surgeon: Adrian Prows, MD;  Location: [facility];  Service: Cardiovascular;  Laterality: N/A;   Family History  Problem Relation Age of Onset   Diabetes Mother    Chronic Renal Failure Father    Chronic Renal Failure Brother     Social History   Tobacco Use    Smoking status: Never   Smokeless tobacco: Not on file  Substance Use Topics   Alcohol use: No   Marital Status: Married  ROS  Review of Systems  Cardiovascular:  Negative for chest pain, dyspnea on exertion, leg swelling and syncope.  Respiratory:  Negative for shortness of breath.   Gastrointestinal:  Negative for melena.  Neurological:  Negative for dizziness and light-headedness.   Objective  Blood pressure (!) 140/73, pulse 64, height 5\' 9"  (1.753 m), weight 177 lb 12.8 oz (80.6 kg), SpO2 98 %. Body mass index is 26.26 kg/m.     02/08/2022    9:50 AM 11/08/2021    9:46 AM 11/08/2021    9:32 AM  Vitals with BMI  Height 5\' 9"   5\' 9"   Weight 177 lbs 13 oz  174 lbs 3 oz  BMI 51.76  16.07  Systolic 371 062 694  Diastolic 73 72 75  Pulse 64 71 70    Physical Exam Cardiovascular:     Rate and Rhythm: Normal rate and regular rhythm.     Pulses: Normal pulses.          Radial pulses are 2+ on the left side.     Heart sounds: Normal heart sounds. No murmur heard.    No gallop.  Pulmonary:     Effort: Pulmonary effort is normal.     Breath sounds: Normal breath sounds. No wheezing or rales.  Abdominal:     General: Bowel  sounds are normal.     Palpations: Abdomen is soft.     Tenderness: There is no abdominal tenderness.  Musculoskeletal:     Right lower leg: No edema.     Left lower leg: No edema.  Neurological:     Mental Status: He is alert.    Medications and allergies  No Known Allergies   Medication list after today's encounter   Current Outpatient Medications:    aspirin 81 MG chewable tablet, Chew 1 tablet (81 mg total) by mouth daily., Disp: 30 tablet, Rfl: 6   atorvastatin (LIPITOR) 80 MG tablet, Take 1 tablet (80 mg total) by mouth at bedtime., Disp: 30 tablet, Rfl: 6   empagliflozin (JARDIANCE) 10 MG TABS tablet, Take 1 tablet (10 mg total) by mouth daily., Disp: 90 tablet, Rfl: 3   losartan (COZAAR) 25 MG tablet, Take 1 tablet (25 mg total) by mouth  every evening., Disp: 30 tablet, Rfl: 6   metFORMIN (GLUCOPHAGE) 1000 MG tablet, Take 1,000 mg by mouth See admin instructions. 1000 mg every 2-3 days, Disp: , Rfl:    metoprolol tartrate (LOPRESSOR) 25 MG tablet, Take 0.5 tablets (12.5 mg total) by mouth 2 (two) times daily., Disp: 60 tablet, Rfl: 6   Multiple Vitamins-Minerals (ZINC PO), Take 1 tablet by mouth daily., Disp: , Rfl:    Omega-3 Fatty Acids (OMEGA-3 PO), Take 1 capsule by mouth daily., Disp: , Rfl:    OVER THE COUNTER MEDICATION, Take 1 tablet by mouth daily. Potassium supplement, unknown strength, Disp: , Rfl:    OVER THE COUNTER MEDICATION, Take 1 tablet by mouth daily. Vitamin C, unknown strength, Disp: , Rfl:    ticagrelor (BRILINTA) 90 MG TABS tablet, Take 1 tablet (90 mg total) by mouth 2 (two) times daily., Disp: 60 tablet, Rfl: 6  Laboratory examination:   Lab Results  Component Value Date   NA 136 10/31/2021   K 3.9 10/31/2021   CO2 23 10/31/2021   GLUCOSE 253 (H) 10/31/2021   BUN 14 10/31/2021   CREATININE 0.79 10/31/2021   CALCIUM 9.1 10/31/2021   GFRNONAA >60 10/31/2021       Latest Ref Rng & Units 10/31/2021    3:51 AM 10/30/2021    9:21 PM 10/30/2021    8:13 PM  CMP  Glucose 70 - 99 mg/dL 253  269  297   BUN 8 - 23 mg/dL 14  19  20    Creatinine 0.61 - 1.24 mg/dL 0.79  1.07  0.80   Sodium 135 - 145 mmol/L 136  135  134   Potassium 3.5 - 5.1 mmol/L 3.9  4.1  4.1   Chloride 98 - 111 mmol/L 104  98  99   CO2 22 - 32 mmol/L 23  26    Calcium 8.9 - 10.3 mg/dL 9.1  9.3        Latest Ref Rng & Units 10/31/2021    3:51 AM 10/30/2021    8:13 PM 10/30/2021    7:00 PM  CBC  WBC 4.0 - 10.5 K/uL 10.7   8.1   Hemoglobin 13.0 - 17.0 g/dL 15.2  16.3  15.7   Hematocrit 39.0 - 52.0 % 44.4  48.0  48.0   Platelets 150 - 400 K/uL 216   244     Lipid Panel Recent Labs    10/30/21 1900  CHOL 192  TRIG 133  LDLCALC 127*  VLDL 27  HDL 38*  CHOLHDL 5.1    HEMOGLOBIN A1C Lab Results  Component Value  Date   HGBA1C 10.4 (H) 10/30/2021   MPG 251.78 10/30/2021   TSH No results for input(s): "TSH" in the last 8760 hours.  External labs:    Radiology:   Chest Xray 10/30/2021: FINDINGS: Multiple overlying monitor wires. There is mild cardiomegaly, mild central vascular prominence. There is no evidence of edema or focal lung infiltrate. No pleural effusion is seen. The mediastinum is normally outlined. No acute osseous abnormality.   IMPRESSION: Mild cardiomegaly and mild central vascular distention. No other evidence of acute chest process.  Cardiac Studies:   Left Heart Catheterization 10/30/21:  RCA: Large caliber similar dominant vessel.  100% occlusion in the mid segment at the origin of RV branch. Successful PTCA and stenting with 2.75 x 32 mm Synergy XD DES, stenosis reduced from 100% to 0% with TIMI 0 to TIMI-3 flow.  Recommendation: Aggressive risk modification is indicated.  OM 1 is a moderate large caliber vessel however severely tortuous and high risk for intervention.  D2 is small to moderate-sized, again diffusely diseased.  Dual antiplatelet therapy with aspirin and Brilinta for 1 year.    Echocardiogram 10/31/2021:  1. Left ventricular ejection fraction, by estimation, is 55 to 60%. Left ventricular ejection fraction by 3D volume is 58 %. Left ventricular ejection fraction by PLAX is 58 %. The left ventricle has normal function. Left ventricular endocardial border  not optimally defined to evaluate regional wall motion. Left ventricular diastolic parameters are consistent with Grade I diastolic dysfunction (impaired relaxation). Elevated left atrial pressure. The E/e' is 14.5.  2. Right ventricular systolic function is normal. The right ventricular size is normal. There is normal pulmonary artery systolic pressure.  3. The mitral valve is normal in structure. No evidence of mitral valve regurgitation.  4. The aortic valve is normal in structure. Aortic valve  regurgitation is not visualized.  EKG:   EKG 11/08/2021: Normal sinus rhythm at 72 bpm.  No axis deviation.  Left atrial normal.  Diffuse nonspecific T wave abnormality.  Assessment     ICD-10-CM   1. Mixed hyperlipidemia  E78.2 Lipid Panel With LDL/HDL Ratio    2. Essential hypertension  I10     3. Coronary artery disease involving native coronary artery of native heart without angina pectoris  I25.10        Orders Placed This Encounter  Procedures   Lipid Panel With LDL/HDL Ratio    No orders of the defined types were placed in this encounter.   There are no discontinued medications.   Recommendations:   Olyn Landstrom is a 67 y.o.  male patient with diabetes mellitus, hyperlipidemia, hypertension, non-smoker, acute inferior STEMI with EKG revealing ST elevation in the inferior leads with Mobitz 1 AV block status post successful cardiac stenting on 10/30/2021.  Coronary artery disease involving native coronary artery of native heart without angina pectoris Successful PTCA and stenting to RCA with 2.75 x 32 mm Synergy XD DES, stenosis reduced from 100% to 0% with TIMI 0 to TIMI-3 flow on 10/30/2021. No recurrence of chest pain. Tolerating ticagrelor and aspirin without bleeding diathesis. Will continue for 1 year. We had extensive discussion regarding lifestyle modifications, diet, and exercise.   Primary hypertension Pressure slightly elevated at today's visit due to nerves.   Blood pressure at home and readings have been 130s/70s. Continue current medications without changes. Discussed low-sodium diet less than 1500 mg daily.   Mixed hyperlipidemia Continue atorvastatin 80 mg daily.  Goal LDL <70. We will recheck lipid  profile now  Follow-up in 3 months or sooner if needed.   Floydene Flock, DO, [facility]  02/08/2022, 10:35 AM Office: [phone] Pager: [phone]

## 2022-02-14 ENCOUNTER — Telehealth (HOSPITAL_COMMUNITY): Payer: Self-pay

## 2022-02-14 NOTE — Telephone Encounter (Signed)
Called pt to see if he has insurance, pt stated that he still doesn't have insurance, I adv pt to call back to cardiac rehab if he is able to get insurance within the year of his heart event, pt understood. Closed referral.

## 2022-02-16 ENCOUNTER — Encounter: Payer: Self-pay | Admitting: Cardiology

## 2022-02-16 DIAGNOSIS — Z006 Encounter for examination for normal comparison and control in clinical research program: Secondary | ICD-10-CM

## 2022-02-16 NOTE — Progress Notes (Unsigned)
Research visit Protocol [phone] "Prevail"  A placebo controlled, double-blind, randomized phase 3 study to evaluate the effect of 10 mg Obicetrapib in participants with atherosclerotic cardiovascular disease (ASCVD) who are not adequately controlled despite maximally tolerated lipid-modifying therapies.  Physical Exam Vitals and nursing note reviewed.  Constitutional:      General: He is not in acute distress. HENT:     Head: Normocephalic.     Right Ear: External ear normal.     Left Ear: External ear normal.     Nose: Nose normal.  Eyes:     Pupils: Pupils are equal, round, and reactive to light.  Neck:     Vascular: No JVD.  Cardiovascular:     Rate and Rhythm: Normal rate and regular rhythm.     Heart sounds: Normal heart sounds. No murmur heard. Pulmonary:     Effort: Pulmonary effort is normal.     Breath sounds: Normal breath sounds. No wheezing or rales.  Abdominal:     Tenderness: There is abdominal tenderness (LUQ. Chronic, intermittent).  Musculoskeletal:     Cervical back: Normal range of motion.     Right lower leg: No edema.     Left lower leg: No edema.  Skin:    General: Skin is warm and dry.  Neurological:     General: No focal deficit present.     Mental Status: He is oriented to person, place, and time.     Elder Negus, MD Pager: [phone] Office: [phone]

## 2022-03-11 ENCOUNTER — Ambulatory Visit: Payer: Self-pay | Admitting: Internal Medicine

## 2022-03-11 ENCOUNTER — Encounter: Payer: Self-pay | Admitting: Internal Medicine

## 2022-03-11 VITALS — BP 156/82 | HR 59 | Ht 69.0 in | Wt 177.0 lb

## 2022-03-11 DIAGNOSIS — E782 Mixed hyperlipidemia: Secondary | ICD-10-CM

## 2022-03-11 DIAGNOSIS — I10 Essential (primary) hypertension: Secondary | ICD-10-CM

## 2022-03-11 DIAGNOSIS — I2510 Atherosclerotic heart disease of native coronary artery without angina pectoris: Secondary | ICD-10-CM

## 2022-03-11 MED ORDER — LOSARTAN POTASSIUM 50 MG PO TABS: 50.0000 mg | ORAL_TABLET | Freq: Every day | ORAL | 3 refills | Status: DC

## 2022-03-11 MED ORDER — EMPAGLIFLOZIN 25 MG PO TABS: 25.0000 mg | ORAL_TABLET | Freq: Every day | ORAL | 11 refills | Status: DC

## 2022-03-11 NOTE — Progress Notes (Unsigned)
Primary Physician/Referring:  Chesley Noon, MD  Patient ID: Garrett Roberts, male    DOB: 07-21-1955, 67 y.o.   MRN: [id number]  Chief Complaint  Patient presents with   Hyperlipidemia   Medication Management   HPI:    Garrett Roberts  is a 67 y.o. male patient with diabetes mellitus, hyperlipidemia, hypertension, non-smoker, acute inferior STEMI with EKG revealing ST elevation in the inferior leads with Mobitz 1 AV block status post successful cardiac stenting on 10/30/2021.  Patient is here for a follow-up visit. He is going to start fasting for Ramadan in early March and he would like to go over his medication regiment with me prior to that. His blood pressure is still not at goal and it seems like it is not always at goal at home either. Hopefully, he will lose additional weight while fasting for Ramadan. He is agreeable to the med changes we discussed and we went over thoroughly how to take his medications while he is fasting. He denies chest pain, shortness of breath, palpitations, leg edema, orthopnea, PND, TIA/syncope.  Past Medical History:  Diagnosis Date   Coronary artery disease    Diabetes mellitus without complication ([place])    Hypertension    Past Surgical History:  Procedure Laterality Date   CORONARY/GRAFT ACUTE MI REVASCULARIZATION N/A 10/30/2021   Procedure: Coronary/Graft Acute MI Revascularization;  Surgeon: Adrian Prows, MD;  Location: [facility];  Service: Cardiovascular;  Laterality: N/A;   LEFT HEART CATH AND CORONARY ANGIOGRAPHY N/A 10/30/2021   Procedure: LEFT HEART CATH AND CORONARY ANGIOGRAPHY;  Surgeon: Adrian Prows, MD;  Location: [facility];  Service: Cardiovascular;  Laterality: N/A;   Family History  Problem Relation Age of Onset   Diabetes Mother    Chronic Renal Failure Father    Chronic Renal Failure Brother     Social History   Tobacco Use   Smoking status: Never   Smokeless tobacco: Not on file  Substance Use  Topics   Alcohol use: No   Marital Status: Married  ROS  Review of Systems  Cardiovascular:  Negative for chest pain, dyspnea on exertion, leg swelling and syncope.  Respiratory:  Negative for shortness of breath.   Gastrointestinal:  Negative for melena.  Neurological:  Negative for dizziness and light-headedness.   Objective  Blood pressure (!) 156/82, pulse (!) 59, height '5\' 9"'$  (1.753 m), weight 177 lb (80.3 kg), SpO2 98 %. Body mass index is 26.14 kg/m.     03/11/2022   10:44 AM 03/11/2022   10:39 AM 02/08/2022    9:50 AM  Vitals with BMI  Height  '5\' 9"'$  '5\' 9"'$   Weight  177 lbs 177 lbs 13 oz  BMI  [id number]  Systolic [id number]  Diastolic 82 81 73  Pulse 59 60 64    Physical Exam Cardiovascular:     Rate and Rhythm: Normal rate and regular rhythm.     Pulses: Normal pulses.          Radial pulses are 2+ on the left side.     Heart sounds: Normal heart sounds. No murmur heard.    No gallop.  Pulmonary:     Effort: Pulmonary effort is normal.     Breath sounds: Normal breath sounds. No wheezing or rales.  Abdominal:     General: Bowel sounds are normal.     Palpations: Abdomen is soft.     Tenderness: There is no abdominal tenderness.  Musculoskeletal:  Right lower leg: No edema.     Left lower leg: No edema.  Neurological:     Mental Status: He is alert.    Medications and allergies  No Known Allergies   Medication list after today's encounter   Current Outpatient Medications:    aspirin 81 MG chewable tablet, Chew 1 tablet (81 mg total) by mouth daily., Disp: 30 tablet, Rfl: 6   atorvastatin (LIPITOR) 80 MG tablet, Take 1 tablet (80 mg total) by mouth at bedtime., Disp: 30 tablet, Rfl: 6   empagliflozin (JARDIANCE) 25 MG TABS tablet, Take 1 tablet (25 mg total) by mouth daily before breakfast., Disp: 30 tablet, Rfl: 11   losartan (COZAAR) 50 MG tablet, Take 1 tablet (50 mg total) by mouth at bedtime., Disp: 90 tablet, Rfl: 3   metFORMIN (GLUCOPHAGE) 1000  MG tablet, Take 1,000 mg by mouth See admin instructions. 1000 mg every 2-3 days, Disp: , Rfl:    metoprolol tartrate (LOPRESSOR) 25 MG tablet, Take 0.5 tablets (12.5 mg total) by mouth 2 (two) times daily., Disp: 60 tablet, Rfl: 6   Multiple Vitamins-Minerals (ZINC PO), Take 1 tablet by mouth daily., Disp: , Rfl:    Omega-3 Fatty Acids (OMEGA-3 PO), Take 1 capsule by mouth daily., Disp: , Rfl:    OVER THE COUNTER MEDICATION, Take 1 tablet by mouth daily. Potassium supplement, unknown strength, Disp: , Rfl:    OVER THE COUNTER MEDICATION, Take 1 tablet by mouth daily. Vitamin C, unknown strength, Disp: , Rfl:    ticagrelor (BRILINTA) 90 MG TABS tablet, Take 1 tablet (90 mg total) by mouth 2 (two) times daily., Disp: 60 tablet, Rfl: 6  Laboratory examination:   Lab Results  Component Value Date   NA 136 10/31/2021   K 3.9 10/31/2021   CO2 23 10/31/2021   GLUCOSE 253 (H) 10/31/2021   BUN 14 10/31/2021   CREATININE 0.79 10/31/2021   CALCIUM 9.1 10/31/2021   GFRNONAA >60 10/31/2021       Latest Ref Rng & Units 10/31/2021    3:51 AM 10/30/2021    9:21 PM 10/30/2021    8:13 PM  CMP  Glucose 70 - 99 mg/dL 253  269  297   BUN 8 - 23 mg/dL '14  19  20   '$ Creatinine 0.61 - 1.24 mg/dL 0.79  1.07  0.80   Sodium 135 - 145 mmol/L 136  135  134   Potassium 3.5 - 5.1 mmol/L 3.9  4.1  4.1   Chloride 98 - 111 mmol/L 104  98  99   CO2 22 - 32 mmol/L 23  26    Calcium 8.9 - 10.3 mg/dL 9.1  9.3        Latest Ref Rng & Units 10/31/2021    3:51 AM 10/30/2021    8:13 PM 10/30/2021    7:00 PM  CBC  WBC 4.0 - 10.5 K/uL 10.7   8.1   Hemoglobin 13.0 - 17.0 g/dL 15.2  16.3  15.7   Hematocrit 39.0 - 52.0 % 44.4  48.0  48.0   Platelets 150 - 400 K/uL 216   244     Lipid Panel Recent Labs    10/30/21 1900  CHOL 192  TRIG 133  LDLCALC 127*  VLDL 27  HDL 38*  CHOLHDL 5.1    HEMOGLOBIN A1C Lab Results  Component Value Date   HGBA1C 10.4 (H) 10/30/2021   MPG 251.78 10/30/2021   TSH No  results for input(s): "TSH" in  the last 8760 hours.  External labs:    Radiology:   Chest Xray 10/30/2021: FINDINGS: Multiple overlying monitor wires. There is mild cardiomegaly, mild central vascular prominence. There is no evidence of edema or focal lung infiltrate. No pleural effusion is seen. The mediastinum is normally outlined. No acute osseous abnormality.   IMPRESSION: Mild cardiomegaly and mild central vascular distention. No other evidence of acute chest process.  Cardiac Studies:   Left Heart Catheterization 10/30/21:  RCA: Large caliber similar dominant vessel.  100% occlusion in the mid segment at the origin of RV branch. Successful PTCA and stenting with 2.75 x 32 mm Synergy XD DES, stenosis reduced from 100% to 0% with TIMI 0 to TIMI-3 flow.  Recommendation: Aggressive risk modification is indicated.  OM 1 is a moderate large caliber vessel however severely tortuous and high risk for intervention.  D2 is small to moderate-sized, again diffusely diseased.  Dual antiplatelet therapy with aspirin and Brilinta for 1 year.    Echocardiogram 10/31/2021:  1. Left ventricular ejection fraction, by estimation, is 55 to 60%. Left ventricular ejection fraction by 3D volume is 58 %. Left ventricular ejection fraction by PLAX is 58 %. The left ventricle has normal function. Left ventricular endocardial border  not optimally defined to evaluate regional wall motion. Left ventricular diastolic parameters are consistent with Grade I diastolic dysfunction (impaired relaxation). Elevated left atrial pressure. The E/e' is 14.5.  2. Right ventricular systolic function is normal. The right ventricular size is normal. There is normal pulmonary artery systolic pressure.  3. The mitral valve is normal in structure. No evidence of mitral valve regurgitation.  4. The aortic valve is normal in structure. Aortic valve regurgitation is not visualized.  EKG:   EKG 11/08/2021: Normal sinus  rhythm at 72 bpm.  No axis deviation.  Left atrial normal.  Diffuse nonspecific T wave abnormality.  Assessment     ICD-10-CM   1. Coronary artery disease involving native coronary artery of native heart without angina pectoris  0000000 Basic metabolic panel    Lipid Panel With LDL/HDL Ratio    2. Essential hypertension  I10     3. Mixed hyperlipidemia  E78.2        Orders Placed This Encounter  Procedures   Basic metabolic panel   Lipid Panel With LDL/HDL Ratio    Meds ordered this encounter  Medications   empagliflozin (JARDIANCE) 25 MG TABS tablet    Sig: Take 1 tablet (25 mg total) by mouth daily before breakfast.    Dispense:  30 tablet    Refill:  11   losartan (COZAAR) 50 MG tablet    Sig: Take 1 tablet (50 mg total) by mouth at bedtime.    Dispense:  90 tablet    Refill:  3    Medications Discontinued During This Encounter  Medication Reason   empagliflozin (JARDIANCE) 10 MG TABS tablet    losartan (COZAAR) 25 MG tablet      Recommendations:   Garrett Roberts is a 67 y.o.  male patient with diabetes mellitus, hyperlipidemia, hypertension, non-smoker, acute inferior STEMI with EKG revealing ST elevation in the inferior leads with Mobitz 1 AV block status post successful cardiac stenting on 10/30/2021.  Coronary artery disease involving native coronary artery of native heart without angina pectoris Successful PTCA and stenting to RCA with 2.75 x 32 mm Synergy XD DES, stenosis reduced from 100% to 0% with TIMI 0 to TIMI-3 flow on 10/30/2021. No recurrence of chest pain. Tolerating  ticagrelor and aspirin without bleeding diathesis. Will continue for 1 year. We had extensive discussion regarding lifestyle modifications, diet, and exercise.   Primary hypertension Patient has had elevated BP readings for 2 visits now Continue current medications with the following changes: Losartan increased to 50 mg qHS and Jardiance increased to 25 mg daily. He will take his  Metoprolol in the morning before sunset with his morning meal while fasting. He will take his blood thinners then too. At night, with his evening (after sunset) meal, he will take Losartan and Jardiance. Patient advised to keep strict watch on his blood sugar and BP. Discussed low-sodium diet less than 1500 mg daily. BMP and lipid panel ordered, patient to obtain in about 1 week.   Mixed hyperlipidemia Continue atorvastatin 80 mg daily.  Goal LDL <70. Lipids still not at goal, will check lipids and BMP.  Follow-up in 3-6 months or sooner if needed.   Floydene Flock, DO, [facility]  03/13/2022, 11:16 AM Office: [phone] Pager: [phone]

## 2022-05-10 ENCOUNTER — Ambulatory Visit: Payer: Self-pay | Admitting: Internal Medicine

## 2022-10-04 ENCOUNTER — Ambulatory Visit: Payer: Self-pay | Admitting: Cardiology

## 2022-10-11 DIAGNOSIS — I2510 Atherosclerotic heart disease of native coronary artery without angina pectoris: Secondary | ICD-10-CM | POA: Insufficient documentation

## 2022-10-11 DIAGNOSIS — E785 Hyperlipidemia, unspecified: Secondary | ICD-10-CM | POA: Insufficient documentation

## 2022-10-11 DIAGNOSIS — I10 Essential (primary) hypertension: Secondary | ICD-10-CM | POA: Insufficient documentation

## 2022-10-11 NOTE — Progress Notes (Deleted)
  Cardiology Office Note:    Date:  10/11/2022  ID:  Garrett Roberts, DOB Feb 08, 1955, MRN [id number] PCP: Garrett Inch, MD  [facility] Providers Cardiologist:  None { Click to update primary MD,subspecialty MD or APP then REFRESH:1}    {Click to Open Review  :1}   Patient Profile:      Coronary artery disease  Inferior STEMI in 10/2021 s/p 2.75 x 32 mm DES to [facility]  LHC 10/30/21: mLAD 50, pD1 90, pD2 90; mLCx 30, dLCx 95, OM2 30, OM1 90, OM3 99; pRCA 20, mRCA 100 TTE 10/31/21: EF 55-60, Gr 1 DD, NL RVSP, NL PASP Mobitz I Hyperlipidemia  Hypertension  Diabetes mellitus        {      :1}   History of Present Illness:  Discussed the use of AI scribe software for clinical note transcription with the patient, who gave verbal consent to proceed.  Garrett Roberts is a 67 y.o. male who returns for follow up o fCAD. He was last seen in 03/2022 at [facility].           ROS   See HPI ***    Studies Reviewed:       *** Risk Assessment/Calculations:   {Does this patient have ATRIAL FIBRILLATION?[phone]} No BP recorded.  {Refresh Note OR Click here to enter BP  :1}***       Physical Exam:   VS:  There were no vitals taken for this visit.   Wt Readings from Last 3 Encounters:  03/11/22 177 lb (80.3 kg)  02/08/22 177 lb 12.8 oz (80.6 kg)  11/08/21 174 lb 3.2 oz (79 kg)    Physical Exam***     Assessment and Plan:   Assessment & Plan Coronary artery disease involving native coronary artery of native heart without angina pectoris  Essential hypertension  Pure hypercholesterolemia   Assessment and Plan             {      :1}    {Are you ordering a CV Procedure (e.g. stress test, cath, DCCV, TEE, etc)?   Press F2        :[phone]}  Dispo:  No follow-ups on file.  Signed, Tereso Newcomer, PA-C

## 2022-10-12 ENCOUNTER — Ambulatory Visit: Payer: Self-pay | Attending: Cardiology | Admitting: Physician Assistant

## 2022-10-12 DIAGNOSIS — I10 Essential (primary) hypertension: Secondary | ICD-10-CM

## 2022-10-12 DIAGNOSIS — I2510 Atherosclerotic heart disease of native coronary artery without angina pectoris: Secondary | ICD-10-CM

## 2022-10-12 DIAGNOSIS — E7800 Pure hypercholesterolemia, unspecified: Secondary | ICD-10-CM

## 2022-12-10 ENCOUNTER — Encounter (HOSPITAL_COMMUNITY)
Admission: EM | Disposition: A | Discharge status: Home / Self Care | Payer: Self-pay | Source: Home / Self Care | Attending: Cardiovascular Disease

## 2022-12-10 ENCOUNTER — Other Ambulatory Visit: Payer: Self-pay

## 2022-12-10 ENCOUNTER — Inpatient Hospital Stay (HOSPITAL_COMMUNITY)
Admission: EM | Admit: 2022-12-10 | Discharge: 2022-12-15 | DRG: 321 | Disposition: A | Discharge status: Home / Self Care | Payer: MEDICAID | Attending: Cardiovascular Disease | Admitting: Cardiovascular Disease

## 2022-12-10 ENCOUNTER — Encounter (HOSPITAL_COMMUNITY): Payer: Self-pay | Admitting: *Deleted

## 2022-12-10 ENCOUNTER — Inpatient Hospital Stay (HOSPITAL_COMMUNITY): Payer: MEDICAID

## 2022-12-10 ENCOUNTER — Emergency Department (HOSPITAL_COMMUNITY): Payer: MEDICAID

## 2022-12-10 DIAGNOSIS — Z7984 Long term (current) use of oral hypoglycemic drugs: Secondary | ICD-10-CM

## 2022-12-10 DIAGNOSIS — I213 ST elevation (STEMI) myocardial infarction of unspecified site: Principal | ICD-10-CM

## 2022-12-10 DIAGNOSIS — K5900 Constipation, unspecified: Secondary | ICD-10-CM

## 2022-12-10 DIAGNOSIS — Z91148 Patient's other noncompliance with medication regimen for other reason: Secondary | ICD-10-CM | POA: Diagnosis not present

## 2022-12-10 DIAGNOSIS — I255 Ischemic cardiomyopathy: Secondary | ICD-10-CM | POA: Diagnosis present

## 2022-12-10 DIAGNOSIS — I4510 Unspecified right bundle-branch block: Secondary | ICD-10-CM | POA: Diagnosis present

## 2022-12-10 DIAGNOSIS — I4891 Unspecified atrial fibrillation: Secondary | ICD-10-CM | POA: Diagnosis present

## 2022-12-10 DIAGNOSIS — I2109 ST elevation (STEMI) myocardial infarction involving other coronary artery of anterior wall: Principal | ICD-10-CM | POA: Diagnosis present

## 2022-12-10 DIAGNOSIS — I5023 Acute on chronic systolic (congestive) heart failure: Secondary | ICD-10-CM | POA: Diagnosis present

## 2022-12-10 DIAGNOSIS — I5021 Acute systolic (congestive) heart failure: Secondary | ICD-10-CM | POA: Diagnosis present

## 2022-12-10 DIAGNOSIS — R079 Chest pain, unspecified: Secondary | ICD-10-CM

## 2022-12-10 DIAGNOSIS — Z841 Family history of disorders of kidney and ureter: Secondary | ICD-10-CM | POA: Diagnosis not present

## 2022-12-10 DIAGNOSIS — K2970 Gastritis, unspecified, without bleeding: Secondary | ICD-10-CM | POA: Diagnosis present

## 2022-12-10 DIAGNOSIS — Z7982 Long term (current) use of aspirin: Secondary | ICD-10-CM

## 2022-12-10 DIAGNOSIS — Z7901 Long term (current) use of anticoagulants: Secondary | ICD-10-CM | POA: Diagnosis not present

## 2022-12-10 DIAGNOSIS — Z955 Presence of coronary angioplasty implant and graft: Secondary | ICD-10-CM | POA: Diagnosis not present

## 2022-12-10 DIAGNOSIS — E785 Hyperlipidemia, unspecified: Secondary | ICD-10-CM | POA: Diagnosis present

## 2022-12-10 DIAGNOSIS — Z7902 Long term (current) use of antithrombotics/antiplatelets: Secondary | ICD-10-CM | POA: Diagnosis not present

## 2022-12-10 DIAGNOSIS — Z79899 Other long term (current) drug therapy: Secondary | ICD-10-CM

## 2022-12-10 DIAGNOSIS — I10 Essential (primary) hypertension: Secondary | ICD-10-CM | POA: Diagnosis present

## 2022-12-10 DIAGNOSIS — T466X6A Underdosing of antihyperlipidemic and antiarteriosclerotic drugs, initial encounter: Secondary | ICD-10-CM | POA: Diagnosis present

## 2022-12-10 DIAGNOSIS — E1165 Type 2 diabetes mellitus with hyperglycemia: Secondary | ICD-10-CM | POA: Diagnosis present

## 2022-12-10 DIAGNOSIS — I252 Old myocardial infarction: Secondary | ICD-10-CM | POA: Diagnosis not present

## 2022-12-10 DIAGNOSIS — R0789 Other chest pain: Secondary | ICD-10-CM | POA: Diagnosis present

## 2022-12-10 DIAGNOSIS — I2102 ST elevation (STEMI) myocardial infarction involving left anterior descending coronary artery: Secondary | ICD-10-CM

## 2022-12-10 DIAGNOSIS — E118 Type 2 diabetes mellitus with unspecified complications: Secondary | ICD-10-CM

## 2022-12-10 DIAGNOSIS — R933 Abnormal findings on diagnostic imaging of other parts of digestive tract: Secondary | ICD-10-CM

## 2022-12-10 DIAGNOSIS — I480 Paroxysmal atrial fibrillation: Secondary | ICD-10-CM

## 2022-12-10 DIAGNOSIS — I2510 Atherosclerotic heart disease of native coronary artery without angina pectoris: Secondary | ICD-10-CM | POA: Diagnosis present

## 2022-12-10 DIAGNOSIS — Z833 Family history of diabetes mellitus: Secondary | ICD-10-CM

## 2022-12-10 DIAGNOSIS — Z794 Long term (current) use of insulin: Secondary | ICD-10-CM

## 2022-12-10 DIAGNOSIS — Z91128 Patient's intentional underdosing of medication regimen for other reason: Secondary | ICD-10-CM

## 2022-12-10 DIAGNOSIS — I110 Hypertensive heart disease with heart failure: Secondary | ICD-10-CM | POA: Diagnosis present

## 2022-12-10 DIAGNOSIS — R109 Unspecified abdominal pain: Secondary | ICD-10-CM

## 2022-12-10 HISTORY — DX: ST elevation (STEMI) myocardial infarction involving other coronary artery of anterior wall: I21.09

## 2022-12-10 HISTORY — PX: LEFT HEART CATH AND CORONARY ANGIOGRAPHY: CATH118249

## 2022-12-10 HISTORY — PX: CORONARY/GRAFT ACUTE MI REVASCULARIZATION: CATH118305

## 2022-12-10 HISTORY — DX: ST elevation (STEMI) myocardial infarction involving left anterior descending coronary artery: I21.02

## 2022-12-10 LAB — BASIC METABOLIC PANEL
Anion gap: 10 (ref 5–15)
BUN: 16 mg/dL (ref 8–23)
CO2: 23 mmol/L (ref 22–32)
Calcium: 9.2 mg/dL (ref 8.9–10.3)
Chloride: 97 mmol/L — ABNORMAL LOW (ref 98–111)
Creatinine, Ser: 1.14 mg/dL (ref 0.61–1.24)
GFR, Estimated: >60 mL/min (ref >60)
Glucose, Bld: 463 mg/dL — ABNORMAL HIGH (ref 70–99)
Potassium: 3.8 mmol/L (ref 3.5–5.1)
Sodium: 130 mmol/L — ABNORMAL LOW (ref 135–145)

## 2022-12-10 LAB — PROTIME-INR
INR: 1.2 (ref 0.8–1.2)
Prothrombin Time: 15.1 s (ref 11.4–15.2)

## 2022-12-10 LAB — CBC
HCT: 50.6 % (ref 39.0–52.0)
Hemoglobin: 16.5 g/dL (ref 13.0–17.0)
MCH: 28.7 pg (ref 26.0–34.0)
MCHC: 32.6 g/dL (ref 30.0–36.0)
MCV: 88.2 fL (ref 80.0–100.0)
Platelets: 259 10*3/uL (ref 150–400)
RBC: 5.74 MIL/uL (ref 4.22–5.81)
RDW: 11.9 % (ref 11.5–15.5)
WBC: 8.4 10*3/uL (ref 4.0–10.5)
nRBC: 0 % (ref 0.0–0.2)

## 2022-12-10 LAB — LIPID PANEL
Cholesterol: 212 mg/dL — ABNORMAL HIGH (ref 0–200)
HDL: 43 mg/dL (ref >40)
LDL Cholesterol: 146 mg/dL — ABNORMAL HIGH (ref 0–99)
Total CHOL/HDL Ratio: 4.9 {ratio}
Triglycerides: 113 mg/dL (ref <150)
VLDL: 23 mg/dL (ref 0–40)

## 2022-12-10 LAB — MRSA NEXT GEN BY PCR, NASAL: MRSA by PCR Next Gen: NOT DETECTED

## 2022-12-10 LAB — APTT: aPTT: 24 s (ref 24–36)

## 2022-12-10 LAB — HEMOGLOBIN A1C
Hgb A1c MFr Bld: 11.2 % — ABNORMAL HIGH (ref 4.8–5.6)
Mean Plasma Glucose: 274.74 mg/dL

## 2022-12-10 LAB — TROPONIN I (HIGH SENSITIVITY)
Troponin I (High Sensitivity): 137 ng/L (ref <18)
Troponin I (High Sensitivity): >24000 ng/L (ref <18)

## 2022-12-10 LAB — GLUCOSE, CAPILLARY: Glucose-Capillary: 347 mg/dL — ABNORMAL HIGH (ref 70–99)

## 2022-12-10 LAB — CG4 I-STAT (LACTIC ACID): Lactic Acid, Venous: 0.7 mmol/L (ref 0.5–1.9)

## 2022-12-10 LAB — POCT ACTIVATED CLOTTING TIME: Activated Clotting Time: 331 s

## 2022-12-10 SURGERY — CORONARY/GRAFT ACUTE MI REVASCULARIZATION
Anesthesia: LOCAL

## 2022-12-10 MED ORDER — TIROFIBAN HCL IN NACL 5-0.9 MG/100ML-% IV SOLN
INTRAVENOUS | Status: AC
Start: 2022-12-10 — End: ?
  Filled 2022-12-10: qty 100

## 2022-12-10 MED ORDER — TIROFIBAN HCL IN NACL 5-0.9 MG/100ML-% IV SOLN: 0.1500 ug/kg/min | INTRAVENOUS | Status: AC

## 2022-12-10 MED ORDER — HEPARIN SODIUM (PORCINE) 1000 UNIT/ML IJ SOLN
INTRAMUSCULAR | Status: DC | PRN
  Administered 2022-12-10: 6000 [IU] via INTRAVENOUS

## 2022-12-10 MED ORDER — FENTANYL CITRATE (PF) 100 MCG/2ML IJ SOLN
INTRAMUSCULAR | Status: AC
  Filled 2022-12-10: qty 2

## 2022-12-10 MED ORDER — SODIUM CHLORIDE 0.9 % IV SOLN: 250.0000 mL | INTRAVENOUS | Status: AC | PRN

## 2022-12-10 MED ORDER — HYDRALAZINE HCL 20 MG/ML IJ SOLN: 10.0000 mg | INTRAMUSCULAR | Status: AC | PRN

## 2022-12-10 MED ORDER — MIDAZOLAM HCL 2 MG/2ML IJ SOLN
INTRAMUSCULAR | Status: AC
Start: 2022-12-10 — End: ?
  Filled 2022-12-10: qty 2

## 2022-12-10 MED ORDER — HEPARIN (PORCINE) IN NACL 1000-0.9 UT/500ML-% IV SOLN
INTRAVENOUS | Status: DC | PRN
  Administered 2022-12-10 (×2): 500 mL

## 2022-12-10 MED ORDER — NITROGLYCERIN 0.4 MG SL SUBL
0.4000 mg | SUBLINGUAL_TABLET | SUBLINGUAL | Status: DC | PRN
  Administered 2022-12-10 (×2): 0.4 mg via SUBLINGUAL
  Filled 2022-12-10 (×3): qty 1

## 2022-12-10 MED ORDER — EMPAGLIFLOZIN 25 MG PO TABS
25.0000 mg | ORAL_TABLET | Freq: Every day | ORAL | Status: DC
  Administered 2022-12-12 – 2022-12-15 (×4): 25 mg via ORAL
  Filled 2022-12-10 (×5): qty 1

## 2022-12-10 MED ORDER — LIDOCAINE HCL (PF) 1 % IJ SOLN
INTRAMUSCULAR | Status: DC | PRN
  Administered 2022-12-10: 2 mL

## 2022-12-10 MED ORDER — ASPIRIN 81 MG PO CHEW
81.0000 mg | CHEWABLE_TABLET | Freq: Every day | ORAL | Status: DC
  Administered 2022-12-11 – 2022-12-15 (×5): 81 mg via ORAL
  Filled 2022-12-10 (×5): qty 1

## 2022-12-10 MED ORDER — ACETAMINOPHEN 325 MG PO TABS
650.0000 mg | ORAL_TABLET | ORAL | Status: DC | PRN
  Administered 2022-12-10 – 2022-12-13 (×2): 650 mg via ORAL
  Filled 2022-12-10 (×2): qty 2

## 2022-12-10 MED ORDER — IOHEXOL 350 MG/ML SOLN
INTRAVENOUS | Status: DC | PRN
  Administered 2022-12-10: 100 mL

## 2022-12-10 MED ORDER — INSULIN ASPART 100 UNIT/ML IJ SOLN
0.0000 [IU] | Freq: Every day | INTRAMUSCULAR | Status: DC
  Administered 2022-12-10: 4 [IU] via SUBCUTANEOUS
  Administered 2022-12-11: 2 [IU] via SUBCUTANEOUS
  Administered 2022-12-12: 5 [IU] via SUBCUTANEOUS

## 2022-12-10 MED ORDER — MIDAZOLAM HCL 2 MG/2ML IJ SOLN
INTRAMUSCULAR | Status: DC | PRN
  Administered 2022-12-10 (×2): 1 mg via INTRAVENOUS

## 2022-12-10 MED ORDER — HEPARIN SODIUM (PORCINE) 1000 UNIT/ML IJ SOLN
INTRAMUSCULAR | Status: AC
Start: 2022-12-10 — End: ?
  Filled 2022-12-10: qty 10

## 2022-12-10 MED ORDER — LABETALOL HCL 5 MG/ML IV SOLN: 10.0000 mg | INTRAVENOUS | Status: AC | PRN

## 2022-12-10 MED ORDER — TICAGRELOR 90 MG PO TABS
90.0000 mg | ORAL_TABLET | Freq: Two times a day (BID) | ORAL | Status: DC
  Administered 2022-12-11 (×2): 90 mg via ORAL
  Filled 2022-12-10 (×2): qty 1

## 2022-12-10 MED ORDER — METOPROLOL TARTRATE 12.5 MG HALF TABLET
12.5000 mg | ORAL_TABLET | Freq: Two times a day (BID) | ORAL | Status: DC
  Administered 2022-12-10: 12.5 mg via ORAL
  Filled 2022-12-10: qty 1

## 2022-12-10 MED ORDER — SODIUM CHLORIDE 0.9% FLUSH: 3.0000 mL | INTRAVENOUS | Status: DC | PRN

## 2022-12-10 MED ORDER — TICAGRELOR 90 MG PO TABS
ORAL_TABLET | ORAL | Status: DC | PRN
  Administered 2022-12-10: 180 mg via ORAL

## 2022-12-10 MED ORDER — SODIUM CHLORIDE 0.9 % IV SOLN
INTRAVENOUS | Status: AC
  Administered 2022-12-10: 10 mL via INTRAVENOUS

## 2022-12-10 MED ORDER — PANTOPRAZOLE SODIUM 40 MG PO TBEC
40.0000 mg | DELAYED_RELEASE_TABLET | Freq: Every day | ORAL | Status: DC
  Administered 2022-12-10 – 2022-12-11 (×2): 40 mg via ORAL
  Filled 2022-12-10 (×2): qty 1

## 2022-12-10 MED ORDER — HEPARIN SODIUM (PORCINE) 5000 UNIT/ML IJ SOLN: 4000.0000 [IU] | Freq: Once | INTRAMUSCULAR | Status: AC

## 2022-12-10 MED ORDER — NITROGLYCERIN 0.4 MG SL SUBL: 0.4000 mg | SUBLINGUAL_TABLET | SUBLINGUAL | Status: DC | PRN

## 2022-12-10 MED ORDER — FENTANYL CITRATE (PF) 100 MCG/2ML IJ SOLN
INTRAMUSCULAR | Status: DC | PRN
  Administered 2022-12-10 (×2): 25 ug via INTRAVENOUS

## 2022-12-10 MED ORDER — VERAPAMIL HCL 2.5 MG/ML IV SOLN
INTRAVENOUS | Status: AC
Start: 2022-12-10 — End: ?
  Filled 2022-12-10: qty 2

## 2022-12-10 MED ORDER — TIROFIBAN HCL IN NACL 5-0.9 MG/100ML-% IV SOLN
INTRAVENOUS | Status: AC | PRN
  Administered 2022-12-10: .15 ug/kg/min via INTRAVENOUS

## 2022-12-10 MED ORDER — INSULIN ASPART 100 UNIT/ML IJ SOLN
0.0000 [IU] | Freq: Three times a day (TID) | INTRAMUSCULAR | Status: DC
  Administered 2022-12-11: 8 [IU] via SUBCUTANEOUS
  Administered 2022-12-11: 15 [IU] via SUBCUTANEOUS
  Administered 2022-12-11 – 2022-12-12 (×2): 8 [IU] via SUBCUTANEOUS
  Administered 2022-12-12: 3 [IU] via SUBCUTANEOUS
  Administered 2022-12-13 (×2): 5 [IU] via SUBCUTANEOUS
  Administered 2022-12-14: 2 [IU] via SUBCUTANEOUS
  Administered 2022-12-14: 5 [IU] via SUBCUTANEOUS
  Administered 2022-12-15 (×2): 3 [IU] via SUBCUTANEOUS

## 2022-12-10 MED ORDER — TICAGRELOR 90 MG PO TABS
180.0000 mg | ORAL_TABLET | Freq: Once | ORAL | Status: DC
  Filled 2022-12-10: qty 2

## 2022-12-10 MED ORDER — VERAPAMIL HCL 2.5 MG/ML IV SOLN
INTRAVENOUS | Status: DC | PRN
  Administered 2022-12-10: 10 mL via INTRA_ARTERIAL

## 2022-12-10 MED ORDER — ONDANSETRON HCL 4 MG/2ML IJ SOLN
4.0000 mg | Freq: Four times a day (QID) | INTRAMUSCULAR | Status: DC | PRN
  Administered 2022-12-11 (×2): 4 mg via INTRAVENOUS
  Filled 2022-12-10 (×3): qty 2

## 2022-12-10 MED ORDER — LIDOCAINE HCL (PF) 1 % IJ SOLN
INTRAMUSCULAR | Status: AC
Start: 2022-12-10 — End: ?
  Filled 2022-12-10: qty 30

## 2022-12-10 MED ORDER — MORPHINE SULFATE (PF) 4 MG/ML IV SOLN
4.0000 mg | Freq: Once | INTRAVENOUS | Status: AC
  Administered 2022-12-10: 4 mg via INTRAVENOUS
  Filled 2022-12-10: qty 1

## 2022-12-10 MED ORDER — HEPARIN SODIUM (PORCINE) 5000 UNIT/ML IJ SOLN
INTRAMUSCULAR | Status: AC
  Administered 2022-12-10: 4000 [IU] via INTRAVENOUS
  Filled 2022-12-10: qty 1

## 2022-12-10 MED ORDER — ATORVASTATIN CALCIUM 80 MG PO TABS
80.0000 mg | ORAL_TABLET | Freq: Every day | ORAL | Status: DC
  Administered 2022-12-10 – 2022-12-14 (×5): 80 mg via ORAL
  Filled 2022-12-10 (×5): qty 1

## 2022-12-10 MED ORDER — SODIUM CHLORIDE 0.9 % IV SOLN: INTRAVENOUS | Status: AC

## 2022-12-10 MED ORDER — TICAGRELOR 90 MG PO TABS
ORAL_TABLET | ORAL | Status: AC
  Filled 2022-12-10: qty 2

## 2022-12-10 MED ORDER — IOHEXOL 350 MG/ML SOLN
75.0000 mL | Freq: Once | INTRAVENOUS | Status: AC | PRN
  Administered 2022-12-10: 75 mL via INTRAVENOUS

## 2022-12-10 MED ORDER — SODIUM CHLORIDE 0.9% FLUSH
3.0000 mL | Freq: Two times a day (BID) | INTRAVENOUS | Status: DC
  Administered 2022-12-11 – 2022-12-15 (×7): 3 mL via INTRAVENOUS

## 2022-12-10 MED ORDER — TIROFIBAN (AGGRASTAT) BOLUS VIA INFUSION
INTRAVENOUS | Status: DC | PRN
  Administered 2022-12-10: 2007.5 ug via INTRAVENOUS

## 2022-12-10 SURGICAL SUPPLY — 17 items
BALLN EMERGE MR 2.5X12 (BALLOONS) ×1
BALLN ~~LOC~~ EMERGE MR 3.75X20 (BALLOONS) ×1
BALLOON EMERGE MR 2.5X12 (BALLOONS) IMPLANT
BALLOON ~~LOC~~ EMERGE MR 3.75X20 (BALLOONS) IMPLANT
CATH 5FR JL3.5 JR4 ANG PIG MP (CATHETERS) IMPLANT
CATH LAUNCHER 6FR EBU3.5 (CATHETERS) IMPLANT
DEVICE RAD COMP TR BAND LRG (VASCULAR PRODUCTS) IMPLANT
GLIDESHEATH SLEND SS 6F .021 (SHEATH) IMPLANT
GUIDEWIRE INQWIRE 1.5J.035X260 (WIRE) IMPLANT
INQWIRE 1.5J .035X260CM (WIRE) ×1
KIT ENCORE 26 ADVANTAGE (KITS) IMPLANT
KIT HEMO VALVE WATCHDOG (MISCELLANEOUS) IMPLANT
PACK CARDIAC CATHETERIZATION (CUSTOM PROCEDURE TRAY) ×2 IMPLANT
SET ATX-X65L (MISCELLANEOUS) IMPLANT
STENT SYNERGY XD 3.50X38 (Permanent Stent) IMPLANT
SYNERGY XD 3.50X38 (Permanent Stent) ×1 IMPLANT
WIRE ASAHI PROWATER 180CM (WIRE) IMPLANT

## 2022-12-10 NOTE — Progress Notes (Signed)
Patient received from cath lab and updates received at bedside. TR band to right radial area with 11cc present. Hand appears dusky and cool, reverse Tora Perches is a D, 1cc of air removed without bleeding. Tora Perches remains D, One additional cc of air removed but site began oozing and 1cc was added back for total of 10cc. Patient denies pain, tingling, or loss of sensation. Will continue to monitor.

## 2022-12-10 NOTE — ED Provider Notes (Signed)
[place] EMERGENCY DEPARTMENT AT [facility] Provider Note   CSN: [id number] Arrival date & time: 12/10/22  1528     History  Chief Complaint  Patient presents with   Chest Pain    Garrett Roberts is a 67 y.o. male.  With a past medical history of MI 2023 status post DES, hypertension, hyperlipidemia and CAD who presents to the ED for chest pain.  Chest pain began approximately 2 hours prior to arrival with associated shortness of breath nausea and vomiting.  Pain localized over substernal region as well as in the left lateral inferior chest wall.  Took 5X 81 mg ASA prior to arrival.  No fevers chills or recent illness.  His presentation today does feel similar to prior MI last year.  He is followed by [facility] with cardiology.   Chest Pain      Home Medications Prior to Admission medications   Medication Sig Start Date End Date Taking? Authorizing Provider  aspirin 81 MG chewable tablet Chew 1 tablet (81 mg total) by mouth daily. 11/01/21   Custovic, Rozell Searing, DO  atorvastatin (LIPITOR) 80 MG tablet Take 1 tablet (80 mg total) by mouth at bedtime. 10/31/21   Custovic, Rozell Searing, DO  empagliflozin (JARDIANCE) 25 MG TABS tablet Take 1 tablet (25 mg total) by mouth daily before breakfast. 03/11/22   Custovic, Rozell Searing, DO  losartan (COZAAR) 50 MG tablet Take 1 tablet (50 mg total) by mouth at bedtime. 03/11/22 06/09/22  Custovic, Rozell Searing, DO  metFORMIN (GLUCOPHAGE) 1000 MG tablet Take 1,000 mg by mouth See admin instructions. 1000 mg every 2-3 days    [provider]  metoprolol tartrate (LOPRESSOR) 25 MG tablet Take 0.5 tablets (12.5 mg total) by mouth 2 (two) times daily. 10/31/21   Custovic, Rozell Searing, DO  Multiple Vitamins-Minerals (ZINC PO) Take 1 tablet by mouth daily.    [provider]  Omega-3 Fatty Acids (OMEGA-3 PO) Take 1 capsule by mouth daily.    [provider]  OVER THE COUNTER MEDICATION Take 1 tablet by mouth daily. Potassium supplement,  unknown strength    [provider]  OVER THE COUNTER MEDICATION Take 1 tablet by mouth daily. Vitamin C, unknown strength    [provider]  ticagrelor (BRILINTA) 90 MG TABS tablet Take 1 tablet (90 mg total) by mouth 2 (two) times daily. 10/31/21   Custovic, Rozell Searing, DO      Allergies    Patient has no known allergies.    Review of Systems   Review of Systems  Cardiovascular:  Positive for chest pain.    Physical Exam Updated Vital Signs BP (!) 142/96   Pulse 73   Temp 97.9 F (36.6 C) (Oral)   Resp 20   Ht 5\' 9"  (1.753 m)   Wt 80.3 kg   SpO2 100%   BMI 26.14 kg/m  Physical Exam Vitals and nursing note reviewed.  HENT:     Head: Normocephalic and atraumatic.  Eyes:     Pupils: Pupils are equal, round, and reactive to light.  Cardiovascular:     Rate and Rhythm: Normal rate and regular rhythm.  Pulmonary:     Effort: Pulmonary effort is normal.     Breath sounds: Normal breath sounds.  Abdominal:     Palpations: Abdomen is soft.     Tenderness: There is no abdominal tenderness.  Skin:    General: Skin is warm and dry.  Neurological:     Mental Status: He is alert.  Psychiatric:  Mood and Affect: Mood normal.     ED Results / Procedures / Treatments   Labs (all labs ordered are listed, but only abnormal results are displayed) Labs Reviewed  BASIC METABOLIC PANEL - Abnormal; Notable for the following components:      Result Value   Sodium 130 (*)    Chloride 97 (*)    Glucose, Bld 463 (*)    All other components within normal limits  HEMOGLOBIN A1C - Abnormal; Notable for the following components:   Hgb A1c MFr Bld 11.2 (*)    All other components within normal limits  TROPONIN I (HIGH SENSITIVITY) - Abnormal; Notable for the following components:   Troponin I (High Sensitivity) 137 (*)    All other components within normal limits  CBC  PROTIME-INR  APTT  LIPID PANEL  I-STAT CG4 LACTIC ACID, ED  TROPONIN I (HIGH SENSITIVITY)     EKG EKG Interpretation Date/Time:  Saturday December 10 2022 16:23:06 EST Ventricular Rate:  78 PR Interval:  189 QRS Duration:  95 QT Interval:  359 QTC Calculation: 409 R Axis:   52  Text Interpretation: Sinus rhythm Probable anteroseptal infarct, recent ** ** ACUTE MI / STEMI ** ** Confirmed by Estelle June [id number]) on 12/10/2022 5:19:13 PM  Radiology DG Chest 2 View  Result Date: 12/10/2022 CLINICAL DATA:  Chest pain and shortness of breath EXAM: CHEST - 2 VIEW COMPARISON:  Chest x-ray 11/30/2021 FINDINGS: There are increased central interstitial markings bilaterally. There is a left upper lobe nodule measuring 3 mm, indeterminate. There is no lung consolidation, pleural effusion or pneumothorax. The cardiomediastinal silhouette is within normal limits. No acute fractures are seen. IMPRESSION: 1. Increased central interstitial markings bilaterally may represent bronchitis or atypical infection. 2. Left upper lobe nodule measuring 3 mm, indeterminate. No follow-up needed if patient is low-risk. Electronically Signed   By: Darliss Cheney M.D.   On: 12/10/2022 16:24    Procedures .Critical Care  Performed by: Royanne Foots, DO Authorized by: Royanne Foots, DO   Critical care provider statement:    Critical care time (minutes):  80   Critical care time was exclusive of:  Separately billable procedures and treating other patients and teaching time   Critical care was necessary to treat or prevent imminent or life-threatening deterioration of the following conditions:  Cardiac failure   Critical care was time spent personally by me on the following activities:  Development of treatment plan with patient or surrogate, discussions with consultants, evaluation of patient's response to treatment, examination of patient, ordering and review of laboratory studies, ordering and review of radiographic studies, ordering and performing treatments and interventions, pulse oximetry,  re-evaluation of patient's condition and review of old charts   I assumed direction of critical care for this patient from another provider in my specialty: no     Care discussed with: admitting provider   Comments:     Discussed with interventional cardiologist Dr. Excell Seltzer who will take patient directly to Cath Lab from the ED     Medications Ordered in ED Medications  nitroGLYCERIN (NITROSTAT) SL tablet 0.4 mg (0.4 mg Sublingual Given 12/10/22 1643)  0.9 %  sodium chloride infusion (10 mLs Intravenous New Bag/Given 12/10/22 1700)  ticagrelor (BRILINTA) tablet 180 mg (180 mg Oral Not Given 12/10/22 1710)  heparin injection 4,000 Units (4,000 Units Intravenous Given 12/10/22 1655)  morphine (PF) 4 MG/ML injection 4 mg (4 mg Intravenous Given 12/10/22 1711)    ED Course/ Medical Decision  Making/ A&P Clinical Course as of 12/10/22 1720  Sat Dec 10, 2022  1715 Patient is being transported to Cath Lab at this time.  He remains hemodynamically stable. [MP]    Clinical Course User Index [MP] Royanne Foots, DO                                 Medical Decision Making 67 year old male with extensive cardiac history as above presenting for acute chest pain began approximately 2 hours prior to arrival.  Afebrile slightly hypertensive on arrival.  Took 5X 81 mg ASA prior to arrival.  On my assessment he is still having active chest pain.  Benign physical exam.  Initial EKG showed isolated ST elevation in V2 but this was poor baseline.  I ordered a repeat which was taken at 1623 which did show significant ST elevations in continuous leads (V2 V3 V4).  For this reason we activated code STEMI and I discussed the findings with Dr. Excell Seltzer (interventional cardiology).  He agrees based on the clinical history and EKG That this presentation is concerning for acute STEMI.  Will initiate heparin bolus along with Brilinta here.  Cardiology PA has evaluated patient in the department and Dr. Excell Seltzer is on his  way.  Patient has had 2 doses of sublingual nitro Which have provided some pain relief.  Will place him on 2 L nasal cannula O2 give him a dose of morphine for analgesia before he is taken the Cath Lab.   Amount and/or Complexity of Data Reviewed Labs: ordered.  Risk Prescription drug management.           Final Clinical Impression(s) / ED Diagnoses Final diagnoses:  ST elevation myocardial infarction (STEMI), unspecified artery (HCC)  Chest pain, unspecified type    Rx / DC Orders ED Discharge Orders     None         Royanne Foots, DO 12/10/22 1720

## 2022-12-10 NOTE — ED Triage Notes (Signed)
The pt started having chest pain one hour ago sob nausea two hours ago  no feet and leg swelling

## 2022-12-10 NOTE — ED Notes (Signed)
EDP at bedside  

## 2022-12-10 NOTE — ED Notes (Signed)
Pt being transported to cath lab

## 2022-12-10 NOTE — H&P (Addendum)
Cardiology Admission History and Physical   Patient ID: Garrett Roberts MRN: [id number]; DOB: July 23, 1955   Admission date: 12/10/2022  PCP:  Pcp, No   [facility] Providers Cardiologist:  previously Dr. Melton Alar of [facility] cardiovascular     Chief Complaint:  Chest pain  Patient Profile:   Garrett Roberts is a 67 y.o. male with  hypertension, hyperlipidemia, DM2, Mobitz 1, and history of CAD who is being seen 12/10/2022 for the evaluation of chest pain.  History of Present Illness:   Garrett Roberts is a 67 year old male with past medical history of hypertension, hyperlipidemia, DM2, Mobitz 1, and history of CAD.  Patient had a inferior STEMI on 10/30/2021, cardiac catheterization performed on the same day showed EF 40 to 45% with inferior akinesis, 50% mid LAD lesion, 90% diffusely diseased a very small D1, small to moderate-sized D2 had a 90% diffuse disease, 30% mid left circumflex lesion, 90% OM1 lesion, 99% ostial OM2 lesion, large RCA with 100% occlusion in the midsegment treated with 2.75 x 32 mm Synergy XD DES.  Postprocedure, patient was placed on aspirin and Brilinta with instruction to continue dual antiplatelet therapy for 1 year.  He was also on losartan and metoprolol for blood pressure control.  Subsequent echocardiogram showed EF 55 to 60% without significant valvular issue.  He was in his usual state of health until 2:30 PM today when he started having nausea, substernal chest pain and left upper and lower quadrant abdominal pain.  His chest discomfort and abdominal discomfort started together.  He eventually sought medical attention at [facility]. Significant lab work include sodium 130, creatinine 1.14, troponin 137, normal hemoglobin.  Chest x-ray showed increased central interstitial marking bilaterally which may represent bronchitis versus atypical infection, 3 mm left upper lobe lung nodule, no follow-up needed if the patient is low risk.   EKG demonstrated significant ST segment elevation in lead V2 through V4 with ST depression in V5 and V6.  EKG finding concerning for anterior STEMI.   Past Medical History:  Diagnosis Date   Coronary artery disease    Diabetes mellitus without complication (HCC)    Hypertension     Past Surgical History:  Procedure Laterality Date   CORONARY/GRAFT ACUTE MI REVASCULARIZATION N/A 10/30/2021   Procedure: Coronary/Graft Acute MI Revascularization;  Surgeon: Yates Decamp, MD;  Location: [facility] INVASIVE CV LAB;  Service: Cardiovascular;  Laterality: N/A;   LEFT HEART CATH AND CORONARY ANGIOGRAPHY N/A 10/30/2021   Procedure: LEFT HEART CATH AND CORONARY ANGIOGRAPHY;  Surgeon: Yates Decamp, MD;  Location: [facility] INVASIVE CV LAB;  Service: Cardiovascular;  Laterality: N/A;     Medications Prior to Admission: Prior to Admission medications   Medication Sig Start Date End Date Taking? Authorizing Provider  aspirin 81 MG chewable tablet Chew 1 tablet (81 mg total) by mouth daily. 11/01/21   Custovic, Rozell Searing, DO  atorvastatin (LIPITOR) 80 MG tablet Take 1 tablet (80 mg total) by mouth at bedtime. 10/31/21   Custovic, Rozell Searing, DO  empagliflozin (JARDIANCE) 25 MG TABS tablet Take 1 tablet (25 mg total) by mouth daily before breakfast. 03/11/22   Custovic, Rozell Searing, DO  losartan (COZAAR) 50 MG tablet Take 1 tablet (50 mg total) by mouth at bedtime. 03/11/22 06/09/22  Custovic, Rozell Searing, DO  metFORMIN (GLUCOPHAGE) 1000 MG tablet Take 1,000 mg by mouth See admin instructions. 1000 mg every 2-3 days    [provider]  metoprolol tartrate (LOPRESSOR) 25 MG tablet Take 0.5 tablets (12.5 mg total) by mouth  2 (two) times daily. 10/31/21   Custovic, Rozell Searing, DO  Multiple Vitamins-Minerals (ZINC PO) Take 1 tablet by mouth daily.    [provider]  Omega-3 Fatty Acids (OMEGA-3 PO) Take 1 capsule by mouth daily.    [provider]  OVER THE COUNTER MEDICATION Take 1 tablet by mouth daily. Potassium  supplement, unknown strength    [provider]  OVER THE COUNTER MEDICATION Take 1 tablet by mouth daily. Vitamin C, unknown strength    [provider]  ticagrelor (BRILINTA) 90 MG TABS tablet Take 1 tablet (90 mg total) by mouth 2 (two) times daily. 10/31/21   Custovic, Rozell Searing, DO     Allergies:   No Known Allergies  Social History:   Social History   Socioeconomic History   Marital status: Married    Spouse name: Not on file   Number of children: Not on file   Years of education: Not on file   Highest education level: Not on file  Occupational History   Not on file  Tobacco Use   Smoking status: Never   Smokeless tobacco: Not on file  Vaping Use   Vaping status: Never Used  Substance and Sexual Activity   Alcohol use: No   Drug use: No   Sexual activity: Not on file  Other Topics Concern   Not on file  Social History Narrative   ** Merged History Encounter **       Social Determinants of Health   Financial Resource Strain: Not on file  Food Insecurity: No Food Insecurity (09/17/2020)   Received from [facility], [facility]   Hunger Vital Sign    Worried About Running Out of Food in the Last Year: Never true    Ran Out of Food in the Last Year: Never true  Transportation Needs: Not on file  Physical Activity: Sufficiently Active (11/27/2019)   Received from [facility], [facility]   Exercise Vital Sign    Days of Exercise per Week: 4 days    Minutes of Exercise per Session: 60 min  Stress: Stress Concern Present (11/27/2019)   Received from [facility], [facility] - Occupational Stress Questionnaire    Feeling of Stress : To some extent  Social Connections: Unknown (05/25/2021)   Received from [facility], [facility]   Social Network    Social Network: Not on file  Intimate Partner Violence: Unknown (04/16/2021)   Received from [facility], [facility]   HITS    Physically  Hurt: Not on file    Insult or Talk Down To: Not on file    Threaten Physical Harm: Not on file    Scream or Curse: Not on file    Family History:   The patient's family history includes Chronic Renal Failure in his brother and father; Diabetes in his mother.    ROS:  Please see the history of present illness.  All other ROS reviewed and negative.     Physical Exam/Data:   Vitals:   12/10/22 1541 12/10/22 1630 12/10/22 1645 12/10/22 1657  BP:  (!) 150/97 (!) 142/96   Pulse:      Resp:  (!) 22 20   Temp:    97.9 F (36.6 C)  TempSrc:    Oral  SpO2:      Weight: 80.3 kg     Height: 5\' 9"  (1.753 m)      No intake or output data in the  24 hours ending 12/10/22 1721    12/10/2022    3:41 PM 03/11/2022   10:39 AM 02/08/2022    9:50 AM  Last 3 Weights  Weight (lbs) 177 lb 0.5 oz 177 lb 177 lb 12.8 oz  Weight (kg) 80.3 kg 80.287 kg 80.65 kg     Body mass index is 26.14 kg/m.  General:  Well nourished, well developed, in no acute distress HEENT: normal Neck: no JVD Vascular: No carotid bruits; Distal pulses 2+ bilaterally   Cardiac:  normal S1, S2; RRR; no murmur  Lungs:  clear to auscultation bilaterally, no wheezing, rhonchi or rales  Abd: soft, nontender, no hepatomegaly  Ext: no edema Musculoskeletal:  No deformities, BUE and BLE strength normal and equal Skin: warm and dry  Neuro:  CNs 2-12 intact, no focal abnormalities noted Psych:  Normal affect    EKG:  The ECG that was done and was personally reviewed and demonstrates normal sinus rhythm with ST elevation in V2-V4, ST depression in V5-V6  Relevant CV Studies:  Cath 10/30/2021 Left Heart Catheterization 10/30/21:  LV 191/2, EDP 12 mmHg.  Ao 156/98, mean 122 mmHg.  No pressure gradient across the aortic valve.  LVEF 40 to 45% with inferior akinesis. LM: Large caliber vessel.  Smooth and normal. LAD: Large caliber vessel.  Mid segment has a focal 50% stenosis.  Gives origin to very small D1 which is diffusely  diseased, small to moderate size D2 which is diffusely diseased with a proximal diffuse 90% stenosis.  Mid to distal LAD has minimal disease. CX: Large caliber vessel in the proximal segment.  Mid segment has a 30% stenosis of the origin of small OM 2.  OM1 is very large with a focal 90% stenosis however severely tortuous.  OM 3 is small with ostial 99% stenosis and distal circumflex is severely diffusely diseased and small. RCA: Large caliber similar dominant vessel.  100% occlusion in the mid segment at the origin of RV branch. Successful PTCA and stenting with 2.75 x 32 mm Synergy XD DES, stenosis reduced from 100% to 0% with TIMI 0 to TIMI-3 flow.   Recommendation: Aggressive risk modification is indicated.  OM 1 is a moderate large caliber vessel however severely tortuous and high risk for intervention.  D2 is small to moderate-sized, again diffusely diseased.  Dual antiplatelet therapy with aspirin and Brilinta for 1 year.        Echo 10/31/2021  1. Left ventricular ejection fraction, by estimation, is 55 to 60%. Left  ventricular ejection fraction by 3D volume is 58 %. Left ventricular  ejection fraction by PLAX is 58 %. The left ventricle has normal function.  Left ventricular endocardial border  not optimally defined to evaluate regional wall motion. Left ventricular  diastolic parameters are consistent with Grade I diastolic dysfunction  (impaired relaxation). Elevated left atrial pressure. The E/e' is 14.5.   2. Right ventricular systolic function is normal. The right ventricular  size is normal. There is normal pulmonary artery systolic pressure.   3. The mitral valve is normal in structure. No evidence of mitral valve  regurgitation.   4. The aortic valve is normal in structure. Aortic valve regurgitation is  not visualized.   Conclusion(s)/Recommendation(s): Normal biventricular function without  evidence of hemodynamically significant valvular heart disease.      Laboratory Data:  High Sensitivity Troponin:   Recent Labs  Lab 12/10/22 1545  TROPONINIHS 137*      Chemistry Recent Labs  Lab 12/10/22 1545  NA 130*  K 3.8  CL 97*  CO2 23  GLUCOSE 463*  BUN 16  CREATININE 1.14  CALCIUM 9.2  GFRNONAA >60  ANIONGAP 10    No results for input(s): "PROT", "ALBUMIN", "AST", "ALT", "ALKPHOS", "BILITOT" in the last 168 hours. Lipids No results for input(s): "CHOL", "TRIG", "HDL", "LABVLDL", "LDLCALC", "CHOLHDL" in the last 168 hours. Hematology Recent Labs  Lab 12/10/22 1545  WBC 8.4  RBC 5.74  HGB 16.5  HCT 50.6  MCV 88.2  MCH 28.7  MCHC 32.6  RDW 11.9  PLT 259   Thyroid No results for input(s): "TSH", "FREET4" in the last 168 hours. BNPNo results for input(s): "BNP", "PROBNP" in the last 168 hours.  DDimer No results for input(s): "DDIMER" in the last 168 hours.   Radiology/Studies:  DG Chest 2 View  Result Date: 12/10/2022 CLINICAL DATA:  Chest pain and shortness of breath EXAM: CHEST - 2 VIEW COMPARISON:  Chest x-ray 11/30/2021 FINDINGS: There are increased central interstitial markings bilaterally. There is a left upper lobe nodule measuring 3 mm, indeterminate. There is no lung consolidation, pleural effusion or pneumothorax. The cardiomediastinal silhouette is within normal limits. No acute fractures are seen. IMPRESSION: 1. Increased central interstitial markings bilaterally may represent bronchitis or atypical infection. 2. Left upper lobe nodule measuring 3 mm, indeterminate. No follow-up needed if patient is low-risk. Electronically Signed   By: Darliss Cheney M.D.   On: 12/10/2022 16:24     Assessment and Plan:   Anterior STEMI  -Initial EKG shows ST elevation in V2-V4 and ST depression in V5-V6.  Symptom similar to the previous angina.  Started around 2:30 PM.  -Will proceed with emergent cardiac catheterization given evidence of anterior STEMI.  Was previously on aspirin and Brilinta, Brilinta stopped 3 months  ago.  He has taken 5 tablet of aspirin prior to hospitalization today.  Left upper and lower quadrant abdominal pain: Symptoms started at the same time as chest pain, we will proceed with cardiac catheterization first.  If symptom continues, will obtain abdominal CT  Hypertension: Blood pressure elevated in the ED  Hyperlipidemia: On Lipitor  DM2: On Jardiance and metformin.  Hold metformin.   Risk Assessment/Risk Scores:    TIMI Risk Score for ST  Elevation MI:   The patient's TIMI risk score is 4, which indicates a 7.3% risk of all cause mortality at 30 days.       Code Status: Full Code  Severity of Illness: The appropriate patient status for this patient is INPATIENT. Inpatient status is judged to be reasonable and necessary in order to provide the required intensity of service to ensure the patient's safety. The patient's presenting symptoms, physical exam findings, and initial radiographic and laboratory data in the context of their chronic comorbidities is felt to place them at high risk for further clinical deterioration. Furthermore, it is not anticipated that the patient will be medically stable for discharge from the hospital within 2 midnights of admission.   * I certify that at the point of admission it is my clinical judgment that the patient will require inpatient hospital care spanning beyond 2 midnights from the point of admission due to high intensity of service, high risk for further deterioration and high frequency of surveillance required.*   For questions or updates, please contact [facility] Please consult [email or web address] for contact info under     Ramond Dial, [place]  12/10/2022 5:21 PM   Patient seen, examined. Available data reviewed. Agree  with findings, assessment, and plan as outlined by Azalee Course, PA-C.  The patient is independently interviewed and examined.  He is alert, oriented, and mild distress secondary to pain.  HEENT is normal, JVP is  normal, carotid upstrokes are 2+ and equal without bruits, lungs are clear bilaterally, heart is regular rate and rhythm no murmur gallop, abdomen soft nontender, extremities have no edema, right radial pulses 2+.  The patient has known extensive CAD after presenting with an inferior STEMI 1 year ago and undergoing primary PCI of the right coronary artery.  His cath films have been reviewed and he had diffuse disease elsewhere and a small vessel pattern as well as mild to moderate nonobstructive LAD stenosis.  He presents now with an anterior STEMI.  His EKG is reviewed and shows normal sinus rhythm 78 bpm with an anteroseptal STEMI pattern.  His first EKG at 1543 today is nondiagnostic, but his second EKG at 1623 is diagnostic of anterior STEMI.  A code STEMI is activated.  He is given aspirin and IV heparin in the Cath Lab is activated.  I agree that emergency cardiac catheterization and PCI are indicated.  Reviewed risks, indications, and alternatives to the procedure with the patient and his family.  Emergency consent is obtained.  Further plans/disposition pending his cardiac catheterization results.  The patient has uncontrolled diabetes with hemoglobin A1c 11.2.  Improved glycemic control will be a big part of his treatment plan moving forward.  He will be treated with DAPT and likely will use ticagrelor again since he tolerated it after his infarct last year.  He discontinued this about 2 to 3 months ago.  Will likely place a consult with the diabetes educator.  Otherwise further plans pending his cardiac catheterization results.  Tonny Bollman, M.D. 12/10/2022 6:19 PM

## 2022-12-10 NOTE — ED Triage Notes (Signed)
Unable to get an oral temp after 3 attempts

## 2022-12-10 NOTE — ED Notes (Signed)
The pt took 5 81 mg aspirin 30 minutes ago  and he had an mi one year ago

## 2022-12-11 ENCOUNTER — Inpatient Hospital Stay (HOSPITAL_COMMUNITY): Payer: Self-pay

## 2022-12-11 DIAGNOSIS — I2109 ST elevation (STEMI) myocardial infarction involving other coronary artery of anterior wall: Secondary | ICD-10-CM

## 2022-12-11 DIAGNOSIS — R079 Chest pain, unspecified: Secondary | ICD-10-CM

## 2022-12-11 DIAGNOSIS — R109 Unspecified abdominal pain: Secondary | ICD-10-CM

## 2022-12-11 DIAGNOSIS — R933 Abnormal findings on diagnostic imaging of other parts of digestive tract: Secondary | ICD-10-CM

## 2022-12-11 LAB — BASIC METABOLIC PANEL
Anion gap: 16 — ABNORMAL HIGH (ref 5–15)
BUN: 15 mg/dL (ref 8–23)
CO2: 20 mmol/L — ABNORMAL LOW (ref 22–32)
Calcium: 9.5 mg/dL (ref 8.9–10.3)
Chloride: 97 mmol/L — ABNORMAL LOW (ref 98–111)
Creatinine, Ser: 1.17 mg/dL (ref 0.61–1.24)
GFR, Estimated: >60 mL/min (ref >60)
Glucose, Bld: 340 mg/dL — ABNORMAL HIGH (ref 70–99)
Potassium: 4.3 mmol/L (ref 3.5–5.1)
Sodium: 133 mmol/L — ABNORMAL LOW (ref 135–145)

## 2022-12-11 LAB — CBC
HCT: 51.8 % (ref 39.0–52.0)
Hemoglobin: 17.2 g/dL — ABNORMAL HIGH (ref 13.0–17.0)
MCH: 29.1 pg (ref 26.0–34.0)
MCHC: 33.2 g/dL (ref 30.0–36.0)
MCV: 87.6 fL (ref 80.0–100.0)
Platelets: 275 10*3/uL (ref 150–400)
RBC: 5.91 MIL/uL — ABNORMAL HIGH (ref 4.22–5.81)
RDW: 12.2 % (ref 11.5–15.5)
WBC: 15.4 10*3/uL — ABNORMAL HIGH (ref 4.0–10.5)
nRBC: 0 % (ref 0.0–0.2)

## 2022-12-11 LAB — GLUCOSE, CAPILLARY
Glucose-Capillary: 357 mg/dL — ABNORMAL HIGH (ref 70–99)
Glucose-Capillary: 295 mg/dL — ABNORMAL HIGH (ref 70–99)
Glucose-Capillary: 290 mg/dL — ABNORMAL HIGH (ref 70–99)
Glucose-Capillary: 222 mg/dL — ABNORMAL HIGH (ref 70–99)

## 2022-12-11 LAB — ECHOCARDIOGRAM COMPLETE
Area-P 1/2: 4.97 cm2
Calc EF: 22.7 %
Height: 69 in
S' Lateral: 4.5 cm
Single Plane A2C EF: 15.6 %
Single Plane A4C EF: 28.9 %
Weight: 2832.47 [oz_av]

## 2022-12-11 MED ORDER — POLYETHYLENE GLYCOL 3350 17 G PO PACK
17.0000 g | PACK | Freq: Every day | ORAL | Status: DC
  Administered 2022-12-11: 17 g via ORAL
  Filled 2022-12-11: qty 1

## 2022-12-11 MED ORDER — PANTOPRAZOLE SODIUM 40 MG PO TBEC
40.0000 mg | DELAYED_RELEASE_TABLET | Freq: Two times a day (BID) | ORAL | Status: DC
  Administered 2022-12-11 – 2022-12-15 (×8): 40 mg via ORAL
  Filled 2022-12-11 (×8): qty 1

## 2022-12-11 MED ORDER — PERFLUTREN LIPID MICROSPHERE
1.0000 mL | INTRAVENOUS | Status: AC | PRN
  Administered 2022-12-11: 2 mL via INTRAVENOUS

## 2022-12-11 MED ORDER — POLYETHYLENE GLYCOL 3350 17 G PO PACK
17.0000 g | PACK | Freq: Two times a day (BID) | ORAL | Status: DC
  Administered 2022-12-11 – 2022-12-15 (×7): 17 g via ORAL
  Filled 2022-12-11 (×8): qty 1

## 2022-12-11 MED ORDER — EZETIMIBE 10 MG PO TABS
10.0000 mg | ORAL_TABLET | Freq: Every day | ORAL | Status: DC
  Administered 2022-12-11 – 2022-12-15 (×5): 10 mg via ORAL
  Filled 2022-12-11 (×5): qty 1

## 2022-12-11 MED ORDER — INSULIN DETEMIR 100 UNIT/ML ~~LOC~~ SOLN
5.0000 [IU] | Freq: Two times a day (BID) | SUBCUTANEOUS | Status: DC
  Administered 2022-12-11 – 2022-12-15 (×9): 5 [IU] via SUBCUTANEOUS
  Filled 2022-12-11 (×10): qty 0.05

## 2022-12-11 MED ORDER — ALPRAZOLAM 0.5 MG PO TABS
0.5000 mg | ORAL_TABLET | Freq: Two times a day (BID) | ORAL | Status: DC | PRN
  Administered 2022-12-11 – 2022-12-14 (×5): 0.5 mg via ORAL
  Filled 2022-12-11 (×5): qty 1

## 2022-12-11 MED ORDER — SENNA 8.6 MG PO TABS
1.0000 | ORAL_TABLET | Freq: Every day | ORAL | Status: DC
  Administered 2022-12-11: 8.6 mg via ORAL
  Filled 2022-12-11: qty 1

## 2022-12-11 MED ORDER — LACTULOSE 10 GM/15ML PO SOLN
10.0000 g | Freq: Every day | ORAL | Status: DC | PRN
  Administered 2022-12-11: 10 g via ORAL
  Filled 2022-12-11: qty 15

## 2022-12-11 MED ORDER — SENNA 8.6 MG PO TABS
2.0000 | ORAL_TABLET | Freq: Every day | ORAL | Status: DC
  Administered 2022-12-12 – 2022-12-15 (×3): 17.2 mg via ORAL
  Filled 2022-12-11 (×3): qty 2

## 2022-12-11 MED ORDER — METOPROLOL TARTRATE 25 MG PO TABS
25.0000 mg | ORAL_TABLET | Freq: Two times a day (BID) | ORAL | Status: DC
  Administered 2022-12-11 – 2022-12-12 (×2): 25 mg via ORAL
  Filled 2022-12-11 (×2): qty 1

## 2022-12-11 MED ORDER — CHLORHEXIDINE GLUCONATE CLOTH 2 % EX PADS
6.0000 | MEDICATED_PAD | Freq: Every day | CUTANEOUS | Status: DC
  Administered 2022-12-11 – 2022-12-15 (×5): 6 via TOPICAL

## 2022-12-11 NOTE — Progress Notes (Signed)
  Echocardiogram 2D Echocardiogram has been performed.  Garrett Roberts 12/11/2022, 8:52 AM

## 2022-12-11 NOTE — Progress Notes (Signed)
Progress Note  Patient Name: Garrett Roberts Date of Encounter: 12/11/2022  Primary Cardiologist:   None   Subjective   Complains of abdominal pain.  No SOB.   Inpatient Medications    Scheduled Meds:  aspirin  81 mg Oral Daily   atorvastatin  80 mg Oral QHS   Chlorhexidine Gluconate Cloth  6 each Topical Daily   empagliflozin  25 mg Oral QAC breakfast   insulin aspart  0-15 Units Subcutaneous TID WC   insulin aspart  0-5 Units Subcutaneous QHS   metoprolol tartrate  12.5 mg Oral BID   pantoprazole  40 mg Oral Daily   polyethylene glycol  17 g Oral Daily   senna  1 tablet Oral Daily   sodium chloride flush  3 mL Intravenous Q12H   ticagrelor  90 mg Oral BID   Continuous Infusions:  sodium chloride Stopped (12/10/22 1932)   sodium chloride     PRN Meds: sodium chloride, acetaminophen, nitroGLYCERIN, nitroGLYCERIN, ondansetron (ZOFRAN) IV, sodium chloride flush   Vital Signs    Vitals:   12/11/22 0500 12/11/22 0600 12/11/22 0640 12/11/22 0700  BP: 130/81 128/81  129/79  Pulse: 82 83  83  Resp: (!) 25 (!) 24  (!) 21  Temp:   98.4 F (36.9 C)   TempSrc:   Oral   SpO2: 96% 98%  99%  Weight:      Height:        Intake/Output Summary (Last 24 hours) at 12/11/2022 0807 Last data filed at 12/10/2022 2100 Gross per 24 hour  Intake --  Output 500 ml  Net -500 ml   Filed Weights   12/10/22 1541  Weight: 80.3 kg    Telemetry    NSR - Personally Reviewed  ECG    NA - Personally Reviewed  Physical Exam   GEN: No acute distress.   Neck: No  JVD Cardiac: RRR, 2/6 apical systolic murmur, no diastolic murmurs, rubs, or gallops.  Respiratory: Clear  to auscultation bilaterally. GI: Soft, nontender, non-distended  MS: No  edema; No deformity.  Right radial without bleeding or bruising.  Neuro:  Nonfocal  Psych: Normal affect   Labs    Chemistry Recent Labs  Lab 12/10/22 1545 12/11/22 0301  NA 130* 133*  K 3.8 4.3  CL 97* 97*  CO2 23 20*   GLUCOSE 463* 340*  BUN 16 15  CREATININE 1.14 1.17  CALCIUM 9.2 9.5  GFRNONAA >60 >60  ANIONGAP 10 16*     Hematology Recent Labs  Lab 12/10/22 1545 12/11/22 0301  WBC 8.4 15.4*  RBC 5.74 5.91*  HGB 16.5 17.2*  HCT 50.6 51.8  MCV 88.2 87.6  MCH 28.7 29.1  MCHC 32.6 33.2  RDW 11.9 12.2  PLT 259 275    Cardiac EnzymesNo results for input(s): "TROPONINI" in the last 168 hours. No results for input(s): "TROPIPOC" in the last 168 hours.   BNPNo results for input(s): "BNP", "PROBNP" in the last 168 hours.   DDimer No results for input(s): "DDIMER" in the last 168 hours.   Radiology    CT ABDOMEN PELVIS W CONTRAST  Result Date: 12/10/2022 CLINICAL DATA:  Left upper and lower quadrant abdominal pain. EXAM: CT ABDOMEN AND PELVIS WITH CONTRAST TECHNIQUE: Multidetector CT imaging of the abdomen and pelvis was performed using the standard protocol following bolus administration of intravenous contrast. RADIATION DOSE REDUCTION: This exam was performed according to the departmental dose-optimization program which includes automated exposure control, adjustment of  the mA and/or kV according to patient size and/or use of iterative reconstruction technique. CONTRAST:  75mL OMNIPAQUE IOHEXOL 350 MG/ML SOLN COMPARISON:  CT abdomen pelvis no contrast 05/11/2009. FINDINGS: Lower chest: The heart is slightly enlarged. There is calcification in the right coronary artery, small pericardial effusion. There are trace pleural effusions. Interlobular septal thickening is noted in both lung bases consistent with interstitial edema. Findings likely indicating mild CHF or fluid overload. There are scattered ground-glass opacities which are probably ground-glass edema, less likely ground-glass pneumonitis. Hepatobiliary: No focal liver abnormality is seen. No gallstones, gallbladder wall thickening, or biliary dilatation. Pancreas: No abnormality. Spleen: No abnormality. Adrenals/Urinary Tract: There is no  adrenal mass. There is contrast in both renal collecting systems which would obscure intrarenal stones if present. There is no hydronephrosis, hydroureter or ureteral filling defect. There is symmetric contrast excretion on the delayed images. The bladder is filled with contrast. The bladder wall is unremarkable. Stomach/Bowel: There are moderate thickened folds in the lateral proximal to mid stomach consistent with gastritis. This has increased since 2011. Endoscopy may be indicated but no overt masslike wall thickening is seen. The unopacified small bowel is unremarkable. The appendix is normal caliber. There is a tiny stone in the proximal appendix. There is moderate retained stool in the ascending colon. There is either a focal wall thickening or underdistention of the proximal transverse colon on 3: 34-38. Rest of the large bowel wall is largely contracted but otherwise unremarkable. Consider either a short interval follow-up study to see if this persists, BE, or colonoscopy follow-up unless recently done. Vascular/Lymphatic: There is moderate aortoiliac mixed plaque without AAA. No adenopathy is seen. Reproductive: Prostate is unremarkable. Other: No abdominal wall hernia or abnormality. No abdominopelvic ascites. Musculoskeletal: Mild degenerative change lumbar spine. Bridging osteophytes anterior right SI joint. Bone island left femoral head. No acute or other significant osseous findings. IMPRESSION: 1. Moderate thickened folds in the lateral proximal to mid stomach consistent with gastritis. This has increased since 2011. Endoscopy may be indicated but no overt masslike wall thickening is seen. 2. Focal wall thickening or underdistention of the proximal transverse colon. Consider either a short interval follow-up study to see if this persists, BE, or colonoscopy follow-up unless recently done. 3. Cardiomegaly with trace pleural effusions and interstitial edema in the lung bases. 4. Scattered ground-glass  opacities in the lung bases probably ground-glass edema, less likely ground-glass pneumonitis. 5. Aortic and coronary artery atherosclerosis. 6. Moderate retained stool in the ascending colon. Aortic Atherosclerosis (ICD10-I70.0). Electronically Signed   By: Almira Bar M.D.   On: 12/10/2022 23:09   CARDIAC CATHETERIZATION  Result Date: 12/10/2022   2nd Diag lesion is 100% stenosed. 1.  Acute anterior STEMI, secondary to total occlusion of the proximal LAD.  Primary PCI performed with a 3.5 x 38 mm Synergy DES.  Baseline stenosis 100% with TIMI 0 flow.  Post-PCI stenosis 0% with TIMI-3 flow.  The second diagonal branch is totally occluded both pre and post PCI.  This branch was previously shown to be severely diseased. 2.  Continued patency of the mid RCA stent with moderate distal RCA stenosis of 60 to 70% 3.  Severe stenosis of a tortuous ramus intermedius branch, unchanged from the previous procedure 4.  Severe diffuse distal circumflex stenosis and a diabetic distal diffuse pattern 5.  Moderately elevated LVEDP Recommendations: Resume aspirin and ticagrelor to be continued without interruption for at least 12 months.  Consider long-term P2 Y12 inhibition in this diabetic patient  with extensive coronary artery disease, now presenting with a second STEMI event in a different vessel.  Otherwise routine post MI medical therapy, aggressive risk reduction measures, and check inpatient 2D echocardiogram to assess degree of LV dysfunction.   DG Chest 2 View  Result Date: 12/10/2022 CLINICAL DATA:  Chest pain and shortness of breath EXAM: CHEST - 2 VIEW COMPARISON:  Chest x-ray 11/30/2021 FINDINGS: There are increased central interstitial markings bilaterally. There is a left upper lobe nodule measuring 3 mm, indeterminate. There is no lung consolidation, pleural effusion or pneumothorax. The cardiomediastinal silhouette is within normal limits. No acute fractures are seen. IMPRESSION: 1. Increased central  interstitial markings bilaterally may represent bronchitis or atypical infection. 2. Left upper lobe nodule measuring 3 mm, indeterminate. No follow-up needed if patient is low-risk. Electronically Signed   By: Darliss Cheney M.D.   On: 12/10/2022 16:24    Cardiac Studies   Diagnostic Dominance: Right  Intervention      Patient Profile     67 y.o. male with  hypertension, hyperlipidemia, DM2, Mobitz 1, and history of CAD who is being seen 12/10/2022 for the evaluation of chest pain.   Assessment & Plan    CAD/acute anterior STEMI: Resumed aspirin and ticagrelor to be continued without interruption for at least 12 months. Will nee P2 Y12 inhibition in this diabetic patient with extensive coronary artery disease, now presenting with a second STEMI event in a different vessel.   Echo pending.   Abdominal pain:  CT with results as above.  There are moderate thickened folds in the the mid stomach consistent with gastritis.     I will consult GI to see him.  Give lactulose.   HTN:  Increase beta blocker.    Diabetes:  A1c is 11.2.  Resume Metformin in the AM and consult diabetes management.  Continue SSI today.    He says he has not been taking his meds at home.    Dyslipidemia:  Continue high dose statin.   For questions or updates, please contact [facility] Please consult [email or web address] for contact info under Cardiology/STEMI.   Signed, Rollene Rotunda, MD  12/11/2022, 8:07 AM

## 2022-12-11 NOTE — Consult Note (Signed)
Referring Provider:  Dr. Antoine Poche Primary Care Physician:  Pcp, No Primary Gastroenterologist: Gentry Fitz  Reason for Consultation:  Abdominal pain and abnormal CT scan  HPI: Garrett Roberts is a 67 y.o. male with past medical history of hypertension, hyperlipidemia, DM type II, Mobitz 1, history of coronary artery disease who is seen for evaluation of chest pain.  Found to have acute anterior STEMI.  Echo shows EF of 20 to 25%.  Is on Brilinta.  He has been complaining of abdominal pain.  CT scan of the abdomen pelvis ordered and results below.  CT scan of the abdomen and pelvis with contrast showed the following:  IMPRESSION: 1. Moderate thickened folds in the lateral proximal to mid stomach consistent with gastritis. This has increased since 2011. Endoscopy may be indicated but no overt masslike wall thickening is seen. 2. Focal wall thickening or underdistention of the proximal transverse colon. Consider either a short interval follow-up study to see if this persists, BE, or colonoscopy follow-up unless recently done. 3. Cardiomegaly with trace pleural effusions and interstitial edema in the lung bases. 4. Scattered ground-glass opacities in the lung bases probably ground-glass edema, less likely ground-glass pneumonitis. 5. Aortic and coronary artery atherosclerosis. 6. Moderate retained stool in the ascending colon.   Aortic Atherosclerosis (ICD10-I70.0).  GI was consulted due to the findings of thickened folds in the stomach, possible focal wall thickening versus underdistention of the proximal transverse colon.  They have him on pantoprazole 40 mg daily here, but is not on PPI therapy at home.  Denies any heartburn/reflux/indigestion.  No NSAID use.  He says that he has been having abdominal pain that comes and goes for years.  This pain started again yesterday.  Last bowel movement was 11/29.  Pain is located in the left mid abdomen.  Tells me that he had a colonoscopy  probably 6 or 7 years ago somewhere here in [place].  Recalls it being normal.  Past Medical History:  Diagnosis Date   Coronary artery disease    Diabetes mellitus without complication (HCC)    Hypertension     Past Surgical History:  Procedure Laterality Date   CORONARY/GRAFT ACUTE MI REVASCULARIZATION N/A 10/30/2021   Procedure: Coronary/Graft Acute MI Revascularization;  Surgeon: Yates Decamp, MD;  Location: [facility] INVASIVE CV LAB;  Service: Cardiovascular;  Laterality: N/A;   LEFT HEART CATH AND CORONARY ANGIOGRAPHY N/A 10/30/2021   Procedure: LEFT HEART CATH AND CORONARY ANGIOGRAPHY;  Surgeon: Yates Decamp, MD;  Location: [facility] INVASIVE CV LAB;  Service: Cardiovascular;  Laterality: N/A;    Prior to Admission medications   Medication Sig Start Date End Date Taking? Authorizing Provider  Ascorbic Acid (VITAMIN C PO) Take 1 tablet by mouth daily.   Yes [provider]  aspirin 81 MG chewable tablet Chew 1 tablet (81 mg total) by mouth daily. 11/01/21  Yes Custovic, Rozell Searing, DO  Cyanocobalamin (B-12 PO) Take 1 tablet by mouth daily.   Yes [provider]  MAGNESIUM PO Take 1 tablet by mouth daily.   Yes [provider]  Menaquinone-7 (K2 PO) Take 1 tablet by mouth daily.   Yes [provider]  Multiple Vitamin (MULTIVITAMIN PO) Take 1 tablet by mouth daily.   Yes [provider]  Multiple Vitamins-Minerals (ZINC PO) Take 1 tablet by mouth daily.   Yes [provider]  VITAMIN D PO Take 1 tablet by mouth daily.   Yes [provider]  atorvastatin (LIPITOR) 80 MG tablet Take 1 tablet (80  mg total) by mouth at bedtime. Patient not taking: Reported on 12/11/2022 10/31/21   Custovic, Rozell Searing, DO  empagliflozin (JARDIANCE) 25 MG TABS tablet Take 1 tablet (25 mg total) by mouth daily before breakfast. Patient not taking: Reported on 12/11/2022 03/11/22   Custovic, Rozell Searing, DO  losartan (COZAAR) 50 MG tablet Take 1 tablet (50 mg total) by  mouth at bedtime. Patient not taking: Reported on 12/11/2022 03/11/22 06/09/22  Custovic, Rozell Searing, DO  metFORMIN (GLUCOPHAGE) 1000 MG tablet Take 1,000 mg by mouth See admin instructions. 1000 mg every 2-3 days Patient not taking: Reported on 12/11/2022    [provider]  metoprolol tartrate (LOPRESSOR) 25 MG tablet Take 0.5 tablets (12.5 mg total) by mouth 2 (two) times daily. Patient not taking: Reported on 12/11/2022 10/31/21   Custovic, Rozell Searing, DO  ticagrelor (BRILINTA) 90 MG TABS tablet Take 1 tablet (90 mg total) by mouth 2 (two) times daily. Patient not taking: Reported on 12/10/2022 10/31/21   Custovic, Rozell Searing, DO    Current Facility-Administered Medications  Medication Dose Route Frequency Provider Last Rate Last Admin   0.9 %  sodium chloride infusion   Intravenous Continuous Royanne Foots, DO   Stopped at 12/10/22 1932   0.9 %  sodium chloride infusion  250 mL Intravenous PRN Tonny Bollman, MD       acetaminophen (TYLENOL) tablet 650 mg  650 mg Oral Q4H PRN Azalee Course, PA   650 mg at 12/10/22 1949   aspirin chewable tablet 81 mg  81 mg Oral Daily Azalee Course, [place]   81 mg at 12/11/22 1046   atorvastatin (LIPITOR) tablet 80 mg  80 mg Oral QHS Azalee Course, [place]   80 mg at 12/10/22 2040   Chlorhexidine Gluconate Cloth 2 % PADS 6 each  6 each Topical Daily Tonny Bollman, MD   6 each at 12/11/22 1048   empagliflozin (JARDIANCE) tablet 25 mg  25 mg Oral QAC breakfast Azalee Course, [place]       ezetimibe (ZETIA) tablet 10 mg  10 mg Oral Daily Tonny Bollman, MD       insulin aspart (novoLOG) injection 0-15 Units  0-15 Units Subcutaneous TID WC Tonny Bollman, MD   8 Units at 12/11/22 1214   insulin aspart (novoLOG) injection 0-5 Units  0-5 Units Subcutaneous QHS Tonny Bollman, MD   4 Units at 12/10/22 2157   insulin detemir (LEVEMIR) injection 5 Units  5 Units Subcutaneous BID Tonny Bollman, MD       lactulose (CHRONULAC) 10 GM/15ML solution 10 g  10 g Oral Daily PRN Rollene Rotunda, MD    10 g at 12/11/22 1043   metoprolol tartrate (LOPRESSOR) tablet 25 mg  25 mg Oral BID Rollene Rotunda, MD       nitroGLYCERIN (NITROSTAT) SL tablet 0.4 mg  0.4 mg Sublingual Q5 min PRN Estelle June A, DO   0.4 mg at 12/10/22 1643   nitroGLYCERIN (NITROSTAT) SL tablet 0.4 mg  0.4 mg Sublingual Q5 Min x 3 PRN Azalee Course, PA       ondansetron (ZOFRAN) injection 4 mg  4 mg Intravenous Q6H PRN Azalee Course, PA   4 mg at 12/11/22 0713   pantoprazole (PROTONIX) EC tablet 40 mg  40 mg Oral Daily Marin Olp C, MD   40 mg at 12/11/22 1046   polyethylene glycol (MIRALAX / GLYCOLAX) packet 17 g  17 g Oral Daily Charolotte Eke, MD   17 g at 12/11/22 0234   senna (SENOKOT) tablet  8.6 mg  1 tablet Oral Daily Marin Olp C, MD   8.6 mg at 12/11/22 0745   sodium chloride flush (NS) 0.9 % injection 3 mL  3 mL Intravenous Freada Bergeron, MD   3 mL at 12/11/22 1048   sodium chloride flush (NS) 0.9 % injection 3 mL  3 mL Intravenous PRN Tonny Bollman, MD       ticagrelor [facility]) tablet 90 mg  90 mg Oral BID Tonny Bollman, MD   90 mg at 12/11/22 1047    Allergies as of 12/10/2022 - Review Complete 12/10/2022  Allergen Reaction Noted   Beef-derived products Other (See Comments) 12/10/2022   Pork-derived products  12/10/2022    Family History  Problem Relation Age of Onset   Diabetes Mother    Chronic Renal Failure Father    Chronic Renal Failure Brother     Social History   Socioeconomic History   Marital status: Married    Spouse name: Not on file   Number of children: Not on file   Years of education: Not on file   Highest education level: Not on file  Occupational History   Not on file  Tobacco Use   Smoking status: Never   Smokeless tobacco: Not on file  Vaping Use   Vaping status: Never Used  Substance and Sexual Activity   Alcohol use: No   Drug use: No   Sexual activity: Not on file  Other Topics Concern   Not on file  Social History Narrative   ** Merged History  Encounter **       Social Determinants of Health   Financial Resource Strain: Not on file  Food Insecurity: No Food Insecurity (12/10/2022)   Hunger Vital Sign    Worried About Running Out of Food in the Last Year: Never true    Ran Out of Food in the Last Year: Never true  Transportation Needs: No Transportation Needs (12/10/2022)   PRAPARE - Administrator, Civil Service (Medical): No    Lack of Transportation (Non-Medical): No  Physical Activity: Sufficiently Active (11/27/2019)   Received from [facility], [facility]   Exercise Vital Sign    Days of Exercise per Week: 4 days    Minutes of Exercise per Session: 60 min  Stress: Stress Concern Present (11/27/2019)   Received from [facility], [facility] - Occupational Stress Questionnaire    Feeling of Stress : To some extent  Social Connections: Unknown (05/25/2021)   Received from [facility], [facility]   Social Network    Social Network: Not on file  Intimate Partner Violence: Not At Risk (12/10/2022)   Humiliation, Afraid, Rape, and Kick questionnaire    Fear of Current or Ex-Partner: No    Emotionally Abused: No    Physically Abused: No    Sexually Abused: No    Review of Systems: ROS is O/W negative except as mentioned in HPI.  Physical Exam: Vital signs in last 24 hours: Temp:  [97.6 F (36.4 C)-98.4 F (36.9 C)] 98.4 F (36.9 C) (12/01 0640) Pulse Rate:  [73-104] 98 (12/01 1300) Resp:  [16-26] 26 (12/01 1300) BP: (104-152)/(71-98) 127/82 (12/01 1200) SpO2:  [96 %-100 %] 99 % (12/01 1300) Weight:  [80.3 kg] 80.3 kg (11/30 1541) Last BM Date :  (PTA) General:  Alert, Well-developed, well-nourished, pleasant and cooperative in NAD Head:  Normocephalic and atraumatic. Eyes:  Sclera clear, no icterus.  Conjunctiva  pink. Ears:  Normal auditory acuity. Mouth:  No deformity or lesions.   Lungs:  Clear throughout to auscultation.  No wheezes,  crackles, or rhonchi.  Heart:  Regular rate and rhythm; no murmurs, clicks, rubs, or gallops. Abdomen:  Soft, non-distended.  BS present.  Minimal left sided TTP.   Msk:  Symmetrical without gross deformities. Pulses:  Normal pulses noted. Extremities:  Without clubbing or edema. Neurologic:  Alert and oriented x 4;  grossly normal neurologically. Skin:  Intact without significant lesions or rashes. Psych:  Alert and cooperative. Normal mood and affect.  Intake/Output from previous day: 11/30 0701 - 12/01 0700 In: -  Out: 500 [Urine:500]  Lab Results: Recent Labs    12/10/22 1545 12/11/22 0301  WBC 8.4 15.4*  HGB 16.5 17.2*  HCT 50.6 51.8  PLT 259 275   BMET Recent Labs    12/10/22 1545 12/11/22 0301  NA 130* 133*  K 3.8 4.3  CL 97* 97*  CO2 23 20*  GLUCOSE 463* 340*  BUN 16 15  CREATININE 1.14 1.17  CALCIUM 9.2 9.5   PT/INR Recent Labs    12/10/22 1648  LABPROT 15.1  INR 1.2   Studies/Results: ECHOCARDIOGRAM COMPLETE  Result Date: 12/11/2022    ECHOCARDIOGRAM REPORT   Patient Name:   Garrett Roberts Date of Exam: 12/11/2022 Medical Rec #:  [id number]            Height:       69.0 in Accession #:    [id number]           Weight:       177.0 lb Date of Birth:  1955/07/15           BSA:          1.962 m Patient Age:    67 years             BP:           128/81 mmHg Patient Gender: M                    HR:           85 bpm. Exam Location:  Inpatient Procedure: 2D Echo, Cardiac Doppler, Color Doppler and Intracardiac            Opacification Agent Indications:    R07.9* Chest pain, unspecified  History:        Patient has prior history of Echocardiogram examinations, most                 recent 10/31/2021. Acute MI and CAD, Signs/Symptoms:Chest Pain;                 Risk Factors:Hypertension and Dyslipidemia.  Sonographer:    Sheralyn Boatman RDCS Referring Phys: [id number] HAO MENG  Sonographer Comments: Patient is post PCIx2. IMPRESSIONS  1. Mid septum to apex is akinetic  consistent with LAD infarction. No LV apical thrombus on contrast imaging. Left ventricular ejection fraction, by estimation, is 20 to 25%. The left ventricle has severely decreased function. The left ventricle demonstrates regional wall motion abnormalities (see scoring diagram/findings for description). There is moderate concentric left ventricular hypertrophy. Left ventricular diastolic parameters are consistent with Grade II diastolic dysfunction (pseudonormalization).  2. Right ventricular systolic function is moderately reduced. The right ventricular size is normal.  3. The mitral valve is grossly normal. Mild mitral valve regurgitation. No evidence of mitral stenosis.  4. The aortic valve is tricuspid. Aortic valve regurgitation  is trivial. No aortic stenosis is present.  5. Aortic dilatation noted. There is mild dilatation of the ascending aorta, measuring 42 mm.  6. The inferior vena cava is dilated in size with >50% respiratory variability, suggesting right atrial pressure of 8 mmHg. Comparison(s): The left ventricular function is significantly worse. Conclusion(s)/Recommendation(s): Findings consistent with ischemic cardiomyopathy. No left ventricular mural or apical thrombus/thrombi. FINDINGS  Left Ventricle: Mid septum to apex is akinetic consistent with LAD infarction. No LV apical thrombus on contrast imaging. Left ventricular ejection fraction, by estimation, is 20 to 25%. The left ventricle has severely decreased function. The left ventricle demonstrates regional wall motion abnormalities. Definity contrast agent was given IV to delineate the left ventricular endocardial borders. The left ventricular internal cavity size was normal in size. There is moderate concentric left ventricular hypertrophy. Left ventricular diastolic parameters are consistent with Grade II diastolic dysfunction (pseudonormalization).  LV Wall Scoring: The mid and distal anterior septum, mid inferoseptal segment, apical  anterior segment, apical inferior segment, and apex are akinetic. Right Ventricle: The right ventricular size is normal. No increase in right ventricular wall thickness. Right ventricular systolic function is moderately reduced. Left Atrium: Left atrial size was normal in size. Right Atrium: Right atrial size was normal in size. Pericardium: There is no evidence of pericardial effusion. Mitral Valve: The mitral valve is grossly normal. Mild mitral valve regurgitation. No evidence of mitral valve stenosis. Tricuspid Valve: The tricuspid valve is grossly normal. Tricuspid valve regurgitation is trivial. No evidence of tricuspid stenosis. Aortic Valve: The aortic valve is tricuspid. Aortic valve regurgitation is trivial. No aortic stenosis is present. Pulmonic Valve: The pulmonic valve was grossly normal. Pulmonic valve regurgitation is not visualized. No evidence of pulmonic stenosis. Aorta: Aortic dilatation noted and the aortic root is normal in size and structure. There is mild dilatation of the ascending aorta, measuring 42 mm. Venous: The inferior vena cava is dilated in size with greater than 50% respiratory variability, suggesting right atrial pressure of 8 mmHg. IAS/Shunts: The atrial septum is grossly normal. Additional Comments: There is a small pleural effusion in the left lateral region.  LEFT VENTRICLE PLAX 2D LVIDd:         4.70 cm      Diastology LVIDs:         4.50 cm      LV e' medial:    3.26 cm/s LV PW:         1.30 cm      LV E/e' medial:  26.2 LV IVS:        1.40 cm      LV e' lateral:   9.46 cm/s LVOT diam:     2.20 cm      LV E/e' lateral: 9.0 LV SV:         68 LV SV Index:   35 LVOT Area:     3.80 cm  LV Volumes (MOD) LV vol d, MOD A2C: 128.0 ml LV vol d, MOD A4C: 88.5 ml LV vol s, MOD A2C: 108.0 ml LV vol s, MOD A4C: 62.9 ml LV SV MOD A2C:     20.0 ml LV SV MOD A4C:     88.5 ml LV SV MOD BP:      24.9 ml RIGHT VENTRICLE             IVC RV S prime:     12.40 cm/s  IVC diam: 2.20 cm TAPSE  (M-mode): 1.6 cm LEFT ATRIUM  Index        RIGHT ATRIUM          Index LA diam:        3.10 cm 1.58 cm/m   RA Area:     8.47 cm LA Vol (A2C):   56.4 ml 28.75 ml/m  RA Volume:   15.90 ml 8.11 ml/m LA Vol (A4C):   28.9 ml 14.73 ml/m LA Biplane Vol: 42.4 ml 21.61 ml/m  AORTIC VALVE LVOT Vmax:   111.00 cm/s LVOT Vmean:  69.800 cm/s LVOT VTI:    0.179 m  AORTA Ao Root diam: 3.60 cm Ao Asc diam:  4.10 cm MITRAL VALVE MV Area (PHT): 4.97 cm    SHUNTS MV Decel Time: 153 msec    Systemic VTI:  0.18 m MV E velocity: 85.47 cm/s  Systemic Diam: 2.20 cm MV A velocity: 83.07 cm/s MV E/A ratio:  1.03 Lennie Odor MD Electronically signed by Lennie Odor MD Signature Date/Time: 12/11/2022/12:03:26 PM    Final    CT ABDOMEN PELVIS W CONTRAST  Result Date: 12/10/2022 CLINICAL DATA:  Left upper and lower quadrant abdominal pain. EXAM: CT ABDOMEN AND PELVIS WITH CONTRAST TECHNIQUE: Multidetector CT imaging of the abdomen and pelvis was performed using the standard protocol following bolus administration of intravenous contrast. RADIATION DOSE REDUCTION: This exam was performed according to the departmental dose-optimization program which includes automated exposure control, adjustment of the mA and/or kV according to patient size and/or use of iterative reconstruction technique. CONTRAST:  75mL OMNIPAQUE IOHEXOL 350 MG/ML SOLN COMPARISON:  CT abdomen pelvis no contrast 05/11/2009. FINDINGS: Lower chest: The heart is slightly enlarged. There is calcification in the right coronary artery, small pericardial effusion. There are trace pleural effusions. Interlobular septal thickening is noted in both lung bases consistent with interstitial edema. Findings likely indicating mild CHF or fluid overload. There are scattered ground-glass opacities which are probably ground-glass edema, less likely ground-glass pneumonitis. Hepatobiliary: No focal liver abnormality is seen. No gallstones, gallbladder wall thickening, or  biliary dilatation. Pancreas: No abnormality. Spleen: No abnormality. Adrenals/Urinary Tract: There is no adrenal mass. There is contrast in both renal collecting systems which would obscure intrarenal stones if present. There is no hydronephrosis, hydroureter or ureteral filling defect. There is symmetric contrast excretion on the delayed images. The bladder is filled with contrast. The bladder wall is unremarkable. Stomach/Bowel: There are moderate thickened folds in the lateral proximal to mid stomach consistent with gastritis. This has increased since 2011. Endoscopy may be indicated but no overt masslike wall thickening is seen. The unopacified small bowel is unremarkable. The appendix is normal caliber. There is a tiny stone in the proximal appendix. There is moderate retained stool in the ascending colon. There is either a focal wall thickening or underdistention of the proximal transverse colon on 3: 34-38. Rest of the large bowel wall is largely contracted but otherwise unremarkable. Consider either a short interval follow-up study to see if this persists, BE, or colonoscopy follow-up unless recently done. Vascular/Lymphatic: There is moderate aortoiliac mixed plaque without AAA. No adenopathy is seen. Reproductive: Prostate is unremarkable. Other: No abdominal wall hernia or abnormality. No abdominopelvic ascites. Musculoskeletal: Mild degenerative change lumbar spine. Bridging osteophytes anterior right SI joint. Bone island left femoral head. No acute or other significant osseous findings. IMPRESSION: 1. Moderate thickened folds in the lateral proximal to mid stomach consistent with gastritis. This has increased since 2011. Endoscopy may be indicated but no overt masslike wall thickening is seen. 2. Focal wall  thickening or underdistention of the proximal transverse colon. Consider either a short interval follow-up study to see if this persists, BE, or colonoscopy follow-up unless recently done. 3.  Cardiomegaly with trace pleural effusions and interstitial edema in the lung bases. 4. Scattered ground-glass opacities in the lung bases probably ground-glass edema, less likely ground-glass pneumonitis. 5. Aortic and coronary artery atherosclerosis. 6. Moderate retained stool in the ascending colon. Aortic Atherosclerosis (ICD10-I70.0). Electronically Signed   By: Almira Bar M.D.   On: 12/10/2022 23:09   CARDIAC CATHETERIZATION  Result Date: 12/10/2022   2nd Diag lesion is 100% stenosed. 1.  Acute anterior STEMI, secondary to total occlusion of the proximal LAD.  Primary PCI performed with a 3.5 x 38 mm Synergy DES.  Baseline stenosis 100% with TIMI 0 flow.  Post-PCI stenosis 0% with TIMI-3 flow.  The second diagonal branch is totally occluded both pre and post PCI.  This branch was previously shown to be severely diseased. 2.  Continued patency of the mid RCA stent with moderate distal RCA stenosis of 60 to 70% 3.  Severe stenosis of a tortuous ramus intermedius branch, unchanged from the previous procedure 4.  Severe diffuse distal circumflex stenosis and a diabetic distal diffuse pattern 5.  Moderately elevated LVEDP Recommendations: Resume aspirin and ticagrelor to be continued without interruption for at least 12 months.  Consider long-term P2 Y12 inhibition in this diabetic patient with extensive coronary artery disease, now presenting with a second STEMI event in a different vessel.  Otherwise routine post MI medical therapy, aggressive risk reduction measures, and check inpatient 2D echocardiogram to assess degree of LV dysfunction.   DG Chest 2 View  Result Date: 12/10/2022 CLINICAL DATA:  Chest pain and shortness of breath EXAM: CHEST - 2 VIEW COMPARISON:  Chest x-ray 11/30/2021 FINDINGS: There are increased central interstitial markings bilaterally. There is a left upper lobe nodule measuring 3 mm, indeterminate. There is no lung consolidation, pleural effusion or pneumothorax. The  cardiomediastinal silhouette is within normal limits. No acute fractures are seen. IMPRESSION: 1. Increased central interstitial markings bilaterally may represent bronchitis or atypical infection. 2. Left upper lobe nodule measuring 3 mm, indeterminate. No follow-up needed if patient is low-risk. Electronically Signed   By: Darliss Cheney M.D.   On: 12/10/2022 16:24    IMPRESSION:  67 year old male with coronary artery disease/acute anterior STEMI with an EF of 20 to 25% on Brilinta.  Has been complaining of abdominal pain, says on and off for years, but the pain started again yesterday, left mid-abdomen.  CT scan suggested moderate thickened folds in the stomach consistent with gastritis.  Also possible focal wall thickening versus underdistention of the proximal transverse colon.  Moderate retained stool in the ascending colon.  Patient is completely comfortable.  Minimally tender at most on exam.  Indicates colonoscopy maybe 6 or 7 years ago somewhere here in [place].  Dr. Ewing Schlein said he cannot see anything in their system.  PLAN: -For now we are going to aim to treat conservatively.  He has not been on any PPI at home, they have him on once daily here.  Will increase it to twice daily.  Will plan to more aggressively treat his constipation (I have increased his MiraLAX to twice daily and increased the senna to two a day, discontinued the lactulose as this can cause a lot of gas and discomfort).  If pain improves with these measures then would plan for possible outpatient endoscopy and colonoscopy in a few months  when safe to hold his Brilinta.  Could use Tylenol, heating pad/K-pad, etc, if needed.   Princella Pellegrini. Makari Sanko  12/11/2022, 1:48 PM

## 2022-12-12 ENCOUNTER — Encounter (HOSPITAL_COMMUNITY): Payer: Self-pay | Admitting: Cardiovascular Disease

## 2022-12-12 ENCOUNTER — Other Ambulatory Visit (HOSPITAL_COMMUNITY): Payer: Self-pay

## 2022-12-12 DIAGNOSIS — Z7902 Long term (current) use of antithrombotics/antiplatelets: Secondary | ICD-10-CM

## 2022-12-12 DIAGNOSIS — K5900 Constipation, unspecified: Secondary | ICD-10-CM

## 2022-12-12 LAB — GLUCOSE, CAPILLARY
Glucose-Capillary: 114 mg/dL — ABNORMAL HIGH (ref 70–99)
Glucose-Capillary: 198 mg/dL — ABNORMAL HIGH (ref 70–99)
Glucose-Capillary: 260 mg/dL — ABNORMAL HIGH (ref 70–99)
Glucose-Capillary: 201 mg/dL — ABNORMAL HIGH (ref 70–99)

## 2022-12-12 LAB — HEPARIN LEVEL (UNFRACTIONATED): Heparin Unfractionated: 0.65 [IU]/mL (ref 0.30–0.70)

## 2022-12-12 MED ORDER — CLOPIDOGREL BISULFATE 300 MG PO TABS: 600.0000 mg | ORAL_TABLET | Freq: Once | ORAL | Status: DC

## 2022-12-12 MED ORDER — LIVING WELL WITH DIABETES BOOK
Freq: Once | Status: AC
  Filled 2022-12-12: qty 1

## 2022-12-12 MED ORDER — CLOPIDOGREL BISULFATE 75 MG PO TABS
75.0000 mg | ORAL_TABLET | Freq: Every day | ORAL | Status: DC
  Administered 2022-12-13 – 2022-12-15 (×3): 75 mg via ORAL
  Filled 2022-12-12 (×3): qty 1

## 2022-12-12 MED ORDER — MELATONIN 5 MG PO TABS
5.0000 mg | ORAL_TABLET | Freq: Every day | ORAL | Status: DC
  Administered 2022-12-13 – 2022-12-14 (×3): 5 mg via ORAL
  Filled 2022-12-12 (×3): qty 1

## 2022-12-12 MED ORDER — AMIODARONE LOAD VIA INFUSION
150.0000 mg | Freq: Once | INTRAVENOUS | Status: AC
  Administered 2022-12-12: 150 mg via INTRAVENOUS
  Filled 2022-12-12: qty 83.34

## 2022-12-12 MED ORDER — CLOPIDOGREL BISULFATE 300 MG PO TABS
600.0000 mg | ORAL_TABLET | Freq: Once | ORAL | Status: AC
  Administered 2022-12-12: 600 mg via ORAL
  Filled 2022-12-12: qty 2

## 2022-12-12 MED ORDER — HEPARIN (PORCINE) 25000 UT/250ML-% IV SOLN
1050.0000 [IU]/h | INTRAVENOUS | Status: DC
  Administered 2022-12-12: 1200 [IU]/h via INTRAVENOUS
  Administered 2022-12-13: 1050 [IU]/h via INTRAVENOUS
  Filled 2022-12-12 (×2): qty 250

## 2022-12-12 MED ORDER — AMIODARONE HCL IN DEXTROSE 360-4.14 MG/200ML-% IV SOLN
60.0000 mg/h | INTRAVENOUS | Status: DC
  Administered 2022-12-12 (×2): 60 mg/h via INTRAVENOUS
  Filled 2022-12-12: qty 200

## 2022-12-12 MED ORDER — AMIODARONE HCL IN DEXTROSE 360-4.14 MG/200ML-% IV SOLN
30.0000 mg/h | INTRAVENOUS | Status: DC
  Administered 2022-12-12: 30 mg/h via INTRAVENOUS
  Filled 2022-12-12: qty 200

## 2022-12-12 NOTE — Progress Notes (Addendum)
Cardiology re-paged. Awaiting callback. 12 lead EKG done per protocol. Pt remains pain free.

## 2022-12-12 NOTE — Progress Notes (Signed)
Progress Note   Subjective  Patient without any abdominal pain. He has been eating. AF with RVR noted. On bowel regimen but has not moved his bowels. Family at bedside.   Objective   Vital signs in last 24 hours: Temp:  [97.5 F (36.4 C)-98.3 F (36.8 C)] 98.3 F (36.8 C) (12/02 0700) Pulse Rate:  [80-131] 110 (12/02 1130) Resp:  [8-28] 25 (12/02 1130) BP: (83-137)/(62-107) 97/73 (12/02 1130) SpO2:  [91 %-100 %] 99 % (12/02 1130) Weight:  [80.1 kg] 80.1 kg (12/02 0615) Last BM Date :  (PTA) General:    male in NAD Neurologic:  Alert and oriented,  grossly normal neurologically. Psych:  Cooperative. Normal mood and affect.  Intake/Output from previous day: 12/01 0701 - 12/02 0700 In: 120 [P.O.:120] Out: 400 [Urine:400] Intake/Output this shift: Total I/O In: 275.6 [I.V.:275.6] Out: -   Lab Results: Recent Labs    12/10/22 1545 12/11/22 0301  WBC 8.4 15.4*  HGB 16.5 17.2*  HCT 50.6 51.8  PLT 259 275   BMET Recent Labs    12/10/22 1545 12/11/22 0301  NA 130* 133*  K 3.8 4.3  CL 97* 97*  CO2 23 20*  GLUCOSE 463* 340*  BUN 16 15  CREATININE 1.14 1.17  CALCIUM 9.2 9.5   LFT No results for input(s): "PROT", "ALBUMIN", "AST", "ALT", "ALKPHOS", "BILITOT", "BILIDIR", "IBILI" in the last 72 hours. PT/INR Recent Labs    12/10/22 1648  LABPROT 15.1  INR 1.2    Studies/Results: ECHOCARDIOGRAM COMPLETE  Result Date: 12/11/2022    ECHOCARDIOGRAM REPORT   Patient Name:   Garrett Roberts Date of Exam: 12/11/2022 Medical Rec #:  [id number]            Height:       69.0 in Accession #:    [id number]           Weight:       177.0 lb Date of Birth:  1955/05/10           BSA:          1.962 m Patient Age:    67 years             BP:           128/81 mmHg Patient Gender: M                    HR:           85 bpm. Exam Location:  Inpatient Procedure: 2D Echo, Cardiac Doppler, Color Doppler and Intracardiac            Opacification Agent Indications:    R07.9*  Chest pain, unspecified  History:        Patient has prior history of Echocardiogram examinations, most                 recent 10/31/2021. Acute MI and CAD, Signs/Symptoms:Chest Pain;                 Risk Factors:Hypertension and Dyslipidemia.  Sonographer:    Sheralyn Boatman RDCS Referring Phys: [id number] HAO MENG  Sonographer Comments: Patient is post PCIx2. IMPRESSIONS  1. Mid septum to apex is akinetic consistent with LAD infarction. No LV apical thrombus on contrast imaging. Left ventricular ejection fraction, by estimation, is 20 to 25%. The left ventricle has severely decreased function. The left ventricle demonstrates regional wall motion abnormalities (see scoring diagram/findings for description). There is moderate concentric left  ventricular hypertrophy. Left ventricular diastolic parameters are consistent with Grade II diastolic dysfunction (pseudonormalization).  2. Right ventricular systolic function is moderately reduced. The right ventricular size is normal.  3. The mitral valve is grossly normal. Mild mitral valve regurgitation. No evidence of mitral stenosis.  4. The aortic valve is tricuspid. Aortic valve regurgitation is trivial. No aortic stenosis is present.  5. Aortic dilatation noted. There is mild dilatation of the ascending aorta, measuring 42 mm.  6. The inferior vena cava is dilated in size with >50% respiratory variability, suggesting right atrial pressure of 8 mmHg. Comparison(s): The left ventricular function is significantly worse. Conclusion(s)/Recommendation(s): Findings consistent with ischemic cardiomyopathy. No left ventricular mural or apical thrombus/thrombi. FINDINGS  Left Ventricle: Mid septum to apex is akinetic consistent with LAD infarction. No LV apical thrombus on contrast imaging. Left ventricular ejection fraction, by estimation, is 20 to 25%. The left ventricle has severely decreased function. The left ventricle demonstrates regional wall motion abnormalities. Definity  contrast agent was given IV to delineate the left ventricular endocardial borders. The left ventricular internal cavity size was normal in size. There is moderate concentric left ventricular hypertrophy. Left ventricular diastolic parameters are consistent with Grade II diastolic dysfunction (pseudonormalization).  LV Wall Scoring: The mid and distal anterior septum, mid inferoseptal segment, apical anterior segment, apical inferior segment, and apex are akinetic. Right Ventricle: The right ventricular size is normal. No increase in right ventricular wall thickness. Right ventricular systolic function is moderately reduced. Left Atrium: Left atrial size was normal in size. Right Atrium: Right atrial size was normal in size. Pericardium: There is no evidence of pericardial effusion. Mitral Valve: The mitral valve is grossly normal. Mild mitral valve regurgitation. No evidence of mitral valve stenosis. Tricuspid Valve: The tricuspid valve is grossly normal. Tricuspid valve regurgitation is trivial. No evidence of tricuspid stenosis. Aortic Valve: The aortic valve is tricuspid. Aortic valve regurgitation is trivial. No aortic stenosis is present. Pulmonic Valve: The pulmonic valve was grossly normal. Pulmonic valve regurgitation is not visualized. No evidence of pulmonic stenosis. Aorta: Aortic dilatation noted and the aortic root is normal in size and structure. There is mild dilatation of the ascending aorta, measuring 42 mm. Venous: The inferior vena cava is dilated in size with greater than 50% respiratory variability, suggesting right atrial pressure of 8 mmHg. IAS/Shunts: The atrial septum is grossly normal. Additional Comments: There is a small pleural effusion in the left lateral region.  LEFT VENTRICLE PLAX 2D LVIDd:         4.70 cm      Diastology LVIDs:         4.50 cm      LV e' medial:    3.26 cm/s LV PW:         1.30 cm      LV E/e' medial:  26.2 LV IVS:        1.40 cm      LV e' lateral:   9.46 cm/s LVOT  diam:     2.20 cm      LV E/e' lateral: 9.0 LV SV:         68 LV SV Index:   35 LVOT Area:     3.80 cm  LV Volumes (MOD) LV vol d, MOD A2C: 128.0 ml LV vol d, MOD A4C: 88.5 ml LV vol s, MOD A2C: 108.0 ml LV vol s, MOD A4C: 62.9 ml LV SV MOD A2C:     20.0 ml LV SV MOD A4C:  88.5 ml LV SV MOD BP:      24.9 ml RIGHT VENTRICLE             IVC RV S prime:     12.40 cm/s  IVC diam: 2.20 cm TAPSE (M-mode): 1.6 cm LEFT ATRIUM             Index        RIGHT ATRIUM          Index LA diam:        3.10 cm 1.58 cm/m   RA Area:     8.47 cm LA Vol (A2C):   56.4 ml 28.75 ml/m  RA Volume:   15.90 ml 8.11 ml/m LA Vol (A4C):   28.9 ml 14.73 ml/m LA Biplane Vol: 42.4 ml 21.61 ml/m  AORTIC VALVE LVOT Vmax:   111.00 cm/s LVOT Vmean:  69.800 cm/s LVOT VTI:    0.179 m  AORTA Ao Root diam: 3.60 cm Ao Asc diam:  4.10 cm MITRAL VALVE MV Area (PHT): 4.97 cm    SHUNTS MV Decel Time: 153 msec    Systemic VTI:  0.18 m MV E velocity: 85.47 cm/s  Systemic Diam: 2.20 cm MV A velocity: 83.07 cm/s MV E/A ratio:  1.03 Lennie Odor MD Electronically signed by Lennie Odor MD Signature Date/Time: 12/11/2022/12:03:26 PM    Final    CT ABDOMEN PELVIS W CONTRAST  Result Date: 12/10/2022 CLINICAL DATA:  Left upper and lower quadrant abdominal pain. EXAM: CT ABDOMEN AND PELVIS WITH CONTRAST TECHNIQUE: Multidetector CT imaging of the abdomen and pelvis was performed using the standard protocol following bolus administration of intravenous contrast. RADIATION DOSE REDUCTION: This exam was performed according to the departmental dose-optimization program which includes automated exposure control, adjustment of the mA and/or kV according to patient size and/or use of iterative reconstruction technique. CONTRAST:  75mL OMNIPAQUE IOHEXOL 350 MG/ML SOLN COMPARISON:  CT abdomen pelvis no contrast 05/11/2009. FINDINGS: Lower chest: The heart is slightly enlarged. There is calcification in the right coronary artery, small pericardial effusion. There  are trace pleural effusions. Interlobular septal thickening is noted in both lung bases consistent with interstitial edema. Findings likely indicating mild CHF or fluid overload. There are scattered ground-glass opacities which are probably ground-glass edema, less likely ground-glass pneumonitis. Hepatobiliary: No focal liver abnormality is seen. No gallstones, gallbladder wall thickening, or biliary dilatation. Pancreas: No abnormality. Spleen: No abnormality. Adrenals/Urinary Tract: There is no adrenal mass. There is contrast in both renal collecting systems which would obscure intrarenal stones if present. There is no hydronephrosis, hydroureter or ureteral filling defect. There is symmetric contrast excretion on the delayed images. The bladder is filled with contrast. The bladder wall is unremarkable. Stomach/Bowel: There are moderate thickened folds in the lateral proximal to mid stomach consistent with gastritis. This has increased since 2011. Endoscopy may be indicated but no overt masslike wall thickening is seen. The unopacified small bowel is unremarkable. The appendix is normal caliber. There is a tiny stone in the proximal appendix. There is moderate retained stool in the ascending colon. There is either a focal wall thickening or underdistention of the proximal transverse colon on 3: 34-38. Rest of the large bowel wall is largely contracted but otherwise unremarkable. Consider either a short interval follow-up study to see if this persists, BE, or colonoscopy follow-up unless recently done. Vascular/Lymphatic: There is moderate aortoiliac mixed plaque without AAA. No adenopathy is seen. Reproductive: Prostate is unremarkable. Other: No abdominal wall hernia or abnormality. No abdominopelvic  ascites. Musculoskeletal: Mild degenerative change lumbar spine. Bridging osteophytes anterior right SI joint. Bone island left femoral head. No acute or other significant osseous findings. IMPRESSION: 1. Moderate  thickened folds in the lateral proximal to mid stomach consistent with gastritis. This has increased since 2011. Endoscopy may be indicated but no overt masslike wall thickening is seen. 2. Focal wall thickening or underdistention of the proximal transverse colon. Consider either a short interval follow-up study to see if this persists, BE, or colonoscopy follow-up unless recently done. 3. Cardiomegaly with trace pleural effusions and interstitial edema in the lung bases. 4. Scattered ground-glass opacities in the lung bases probably ground-glass edema, less likely ground-glass pneumonitis. 5. Aortic and coronary artery atherosclerosis. 6. Moderate retained stool in the ascending colon. Aortic Atherosclerosis (ICD10-I70.0). Electronically Signed   By: Almira Bar M.D.   On: 12/10/2022 23:09   CARDIAC CATHETERIZATION  Result Date: 12/10/2022   2nd Diag lesion is 100% stenosed. 1.  Acute anterior STEMI, secondary to total occlusion of the proximal LAD.  Primary PCI performed with a 3.5 x 38 mm Synergy DES.  Baseline stenosis 100% with TIMI 0 flow.  Post-PCI stenosis 0% with TIMI-3 flow.  The second diagonal branch is totally occluded both pre and post PCI.  This branch was previously shown to be severely diseased. 2.  Continued patency of the mid RCA stent with moderate distal RCA stenosis of 60 to 70% 3.  Severe stenosis of a tortuous ramus intermedius branch, unchanged from the previous procedure 4.  Severe diffuse distal circumflex stenosis and a diabetic distal diffuse pattern 5.  Moderately elevated LVEDP Recommendations: Resume aspirin and ticagrelor to be continued without interruption for at least 12 months.  Consider long-term P2 Y12 inhibition in this diabetic patient with extensive coronary artery disease, now presenting with a second STEMI event in a different vessel.  Otherwise routine post MI medical therapy, aggressive risk reduction measures, and check inpatient 2D echocardiogram to assess  degree of LV dysfunction.   DG Chest 2 View  Result Date: 12/10/2022 CLINICAL DATA:  Chest pain and shortness of breath EXAM: CHEST - 2 VIEW COMPARISON:  Chest x-ray 11/30/2021 FINDINGS: There are increased central interstitial markings bilaterally. There is a left upper lobe nodule measuring 3 mm, indeterminate. There is no lung consolidation, pleural effusion or pneumothorax. The cardiomediastinal silhouette is within normal limits. No acute fractures are seen. IMPRESSION: 1. Increased central interstitial markings bilaterally may represent bronchitis or atypical infection. 2. Left upper lobe nodule measuring 3 mm, indeterminate. No follow-up needed if patient is low-risk. Electronically Signed   By: Darliss Cheney M.D.   On: 12/10/2022 16:24       Assessment / Plan:    67 y/o male here with the following:  Abdominal pain Thickening of the stomach on CT scan Constipation Recent STEMI s/p coronary stenting on antiplatelet therapy  Now on Protonix 40 mg twice daily, on bowel regimen to include Senokot, and MiraLAX twice daily.  He is feeling much better in regards to his abdominal pain, eating okay.  He has yet to have a bowel movement since being in the hospital.  Noted to have issues with A-fib and RVR overnight.  Discussed the situation with the patient and his family.  Given his recent cardiac issues, if his pain is otherwise resolved and he is eating okay, would recommend holding off on inpatient endoscopy at this time and we can consider doing this as outpatient pending his course.  Would continue empiric Protonix  twice daily until then can add Carafate if needed.  For his constipation continue his bowel regimen, can increase MiraLAX if needed or give enema if constipation persists.  We can follow him up in the office after his discharge, once cardiac issues have stabilized.    Patient and family in agreement, all questions answered.  PLAN: - holding off on inpatient endoscopic  evaluation at this time given he is feeling better and in light of recent cardiac events - continue empiric protonix 40mg  BID - can add carafate PRN if pain recurs - outpatient follow up after hospitalization for consideration for EGD at some point - our office will coordinate clinic visit - continue bowel regimen - increase Miralax dosing if no BM today, can also give enemas PRN  We will sign off for now, call with questions in the interim.  Harlin Rain, MD [facility]

## 2022-12-12 NOTE — Progress Notes (Addendum)
PHARMACY - ANTICOAGULATION CONSULT NOTE  Pharmacy Consult for heparin Indication: atrial fibrillation  Allergies  Allergen Reactions   Beef-Derived Products Other (See Comments)   Pork-Derived Products     Patient Measurements: Height: 5\' 9"  (175.3 cm) Weight: 80.1 kg (176 lb 9.4 oz) IBW/kg (Calculated) : 70.7 Heparin Dosing Weight: 80kg  Vital Signs: Temp: 98.3 F (36.8 C) (12/02 0700) Temp Source: Oral (12/02 0700) BP: 98/70 (12/02 0700) Pulse Rate: 131 (12/02 0700)  Labs: Recent Labs    12/10/22 1545 12/10/22 1648 12/10/22 1837 12/11/22 0301  HGB 16.5  --   --  17.2*  HCT 50.6  --   --  51.8  PLT 259  --   --  275  APTT  --  24  --   --   LABPROT  --  15.1  --   --   INR  --  1.2  --   --   CREATININE 1.14  --   --  1.17  TROPONINIHS 137*  --  >24,000*  --     Estimated Creatinine Clearance: 61.3 mL/min (by C-G formula based on SCr of 1.17 mg/dL).   Medical History: Past Medical History:  Diagnosis Date   Coronary artery disease    Diabetes mellitus without complication (HCC)    Hypertension      Assessment: 34 yoM admitted with STEMI now with AF RVR. Pharmacy consulted to dose IV heparin. Pt is not on AC PTA. Will defer bolus given recent cath. Noted pork allergy in chart but pt received heparin in cath without any noted issues.  Goal of Therapy:  Heparin level 0.3-0.7 units/ml Monitor platelets by anticoagulation protocol: Yes   Plan:  Heparin 1200 units/h no bolus Check heparin level in 6h  Fredonia Highland, PharmD, [place], [facility] Clinical Pharmacist [phone] Please check AMION for all [facility] Pharmacy numbers 12/12/2022

## 2022-12-12 NOTE — Progress Notes (Signed)
Patient has excellent vasculature; please assess/attempt prior to placing IVT consult.   Allycia Pitz Loyola Mast, RN

## 2022-12-12 NOTE — Progress Notes (Signed)
PHARMACY - ANTICOAGULATION CONSULT NOTE  Pharmacy Consult for heparin Indication: atrial fibrillation  Allergies  Allergen Reactions   Beef-Derived Products Other (See Comments)   Pork-Derived Products     Patient Measurements: Height: 5\' 9"  (175.3 cm) Weight: 80.1 kg (176 lb 9.4 oz) IBW/kg (Calculated) : 70.7 Heparin Dosing Weight: 80kg  Vital Signs: Temp: 97.4 F (36.3 C) (12/02 1619) Temp Source: Oral (12/02 1619) BP: 98/82 (12/02 1600) Pulse Rate: 104 (12/02 1600)  Labs: Recent Labs    12/10/22 1545 12/10/22 1648 12/10/22 1837 12/11/22 0301 12/12/22 1542  HGB 16.5  --   --  17.2*  --   HCT 50.6  --   --  51.8  --   PLT 259  --   --  275  --   APTT  --  24  --   --   --   LABPROT  --  15.1  --   --   --   INR  --  1.2  --   --   --   HEPARINUNFRC  --   --   --   --  0.65  CREATININE 1.14  --   --  1.17  --   TROPONINIHS 137*  --  >24,000*  --   --     Estimated Creatinine Clearance: 61.3 mL/min (by C-G formula based on SCr of 1.17 mg/dL).   Medical History: Past Medical History:  Diagnosis Date   Coronary artery disease    Diabetes mellitus without complication (HCC)    Hypertension      Assessment: 58 yoM admitted with STEMI now with AF RVR. Pharmacy consulted to dose IV heparin. Pt is not on AC PTA. Will defer bolus given recent cath. Noted pork allergy in chart but pt received heparin in cath without any noted issues.  Heparin level 0.65 (therapeutic) on heparin at 1200 units/hr. No bleeding noted.  Goal of Therapy:  Heparin level 0.3-0.7 units/ml Monitor platelets by anticoagulation protocol: Yes   Plan:  Continue heparin 1200 units/hr F/u 6 hr confirmatory heparin level  Christoper Fabian, PharmD, BCPS Please see amion for complete clinical pharmacist phone list 12/12/2022

## 2022-12-12 NOTE — Inpatient Diabetes Management (Signed)
Inpatient Diabetes Program Recommendations  AACE/ADA: New Consensus Statement on Inpatient Glycemic Control (2015)  Target Ranges:  Prepandial:   less than 140 mg/dL      Peak postprandial:   less than 180 mg/dL (1-2 hours)      Critically ill patients:  140 - 180 mg/dL   Lab Results  Component Value Date   GLUCAP 198 (H) 12/12/2022   HGBA1C 11.2 (H) 12/10/2022    Review of Glycemic Control  Latest Reference Range & Units 12/11/22 06:38 12/11/22 11:15 12/11/22 15:32 12/11/22 21:36 12/12/22 06:34 12/12/22 12:32  Glucose-Capillary 70 - 99 mg/dL 161 (H) 096 (H) 045 (H) 222 (H) 114 (H) 198 (H)   Diabetes history: DM 2 Outpatient Diabetes medications:  None (per patient) Current orders for Inpatient glycemic control:  Novolog moderate tid with meals and HS Jardiance 25 mg daily Levemir 5 units bid  Inpatient Diabetes Program Recommendations:    Referral received.  Spoke to patient regarding DM management and current A1C of 11.2%.  Patient states he has not been taking any medications for his DM.  We discussed that his current A1C means that his blood sugars are averaging 274 mg/dL.  I explained that based on A1C he may need insulin at home. He states that he would like to avoid insulin if possible and just be on by mouth medications.  He currently does not have PCP and no insurance is listed?  Will request TOC consult.  We briefly discussed normal blood sugar values and importance of monitoring.  Patient states he mostly drinks water at home.  Will order LWWD booklet for patient  as well.  Explained need for improved control of DM to prevent further complications.  Patient verbalized understanding.  Will follow up with him on 12/3-  Thanks,  Beryl Meager, RN, BC-ADM Inpatient Diabetes Coordinator Pager [phone]  (8a-5p)

## 2022-12-12 NOTE — Progress Notes (Signed)
Rounding Note    Patient Name: Garrett Roberts Date of Encounter: 12/12/2022  [facility] Cardiologist: Dr. Jeanella Cara  Subjective   Postop day 2 anterior STEMI treated with PCI and drug-eluting stenting by Dr. Excell Seltzer.  Patient currently pain-free.  Inpatient Medications    Scheduled Meds:  aspirin  81 mg Oral Daily   atorvastatin  80 mg Oral QHS   Chlorhexidine Gluconate Cloth  6 each Topical Daily   clopidogrel  600 mg Oral Once   [START ON 12/13/2022] clopidogrel  75 mg Oral Daily   empagliflozin  25 mg Oral QAC breakfast   ezetimibe  10 mg Oral Daily   insulin aspart  0-15 Units Subcutaneous TID WC   insulin aspart  0-5 Units Subcutaneous QHS   insulin detemir  5 Units Subcutaneous BID   metoprolol tartrate  25 mg Oral BID   pantoprazole  40 mg Oral BID   polyethylene glycol  17 g Oral BID   senna  2 tablet Oral Daily   sodium chloride flush  3 mL Intravenous Q12H   Continuous Infusions:  amiodarone 60 mg/hr (12/12/22 0800)   Followed by   amiodarone     heparin 1,200 Units/hr (12/12/22 0758)   PRN Meds: acetaminophen, ALPRAZolam, nitroGLYCERIN, nitroGLYCERIN, ondansetron (ZOFRAN) IV, sodium chloride flush   Vital Signs    Vitals:   12/12/22 0715 12/12/22 0730 12/12/22 0745 12/12/22 0800  BP: (!) 88/70 97/69 95/75  108/80  Pulse: (!) 128 (!) 117 (!) 113 (!) 103  Resp: (!) 21 19 (!) 22 20  Temp:      TempSrc:      SpO2: 98% 99% 100% 99%  Weight:      Height:        Intake/Output Summary (Last 24 hours) at 12/12/2022 0853 Last data filed at 12/12/2022 0800 Gross per 24 hour  Intake 283.67 ml  Output 400 ml  Net -116.33 ml      12/12/2022    6:15 AM 12/10/2022    3:41 PM 03/11/2022   10:39 AM  Last 3 Weights  Weight (lbs) 176 lb 9.4 oz 177 lb 0.5 oz 177 lb  Weight (kg) 80.1 kg 80.3 kg 80.287 kg      Telemetry    A-fib with RVR- Personally Reviewed  ECG    Atrial fibrillation with a ventricular sponsor 125, right bundle branch  block.- Personally Reviewed  Physical Exam   GEN: No acute distress.   Neck: No JVD Cardiac: RRR, no murmurs, rubs, or gallops.  Respiratory: Clear to auscultation bilaterally. GI: Soft, nontender, non-distended  MS: No edema; No deformity. Neuro:  Nonfocal  Psych: Normal affect   Labs    High Sensitivity Troponin:   Recent Labs  Lab 12/10/22 1545 12/10/22 1837  TROPONINIHS 137* >24,000*     Chemistry Recent Labs  Lab 12/10/22 1545 12/11/22 0301  NA 130* 133*  K 3.8 4.3  CL 97* 97*  CO2 23 20*  GLUCOSE 463* 340*  BUN 16 15  CREATININE 1.14 1.17  CALCIUM 9.2 9.5  GFRNONAA >60 >60  ANIONGAP 10 16*    Lipids  Recent Labs  Lab 12/10/22 1648  CHOL 212*  TRIG 113  HDL 43  LDLCALC 146*  CHOLHDL 4.9    Hematology Recent Labs  Lab 12/10/22 1545 12/11/22 0301  WBC 8.4 15.4*  RBC 5.74 5.91*  HGB 16.5 17.2*  HCT 50.6 51.8  MCV 88.2 87.6  MCH 28.7 29.1  MCHC 32.6 33.2  RDW  11.9 12.2  PLT 259 275   Thyroid No results for input(s): "TSH", "FREET4" in the last 168 hours.  BNPNo results for input(s): "BNP", "PROBNP" in the last 168 hours.  DDimer No results for input(s): "DDIMER" in the last 168 hours.   Radiology    ECHOCARDIOGRAM COMPLETE  Result Date: 12/11/2022    ECHOCARDIOGRAM REPORT   Patient Name:   Garrett Roberts Date of Exam: 12/11/2022 Medical Rec #:  [id number]            Height:       69.0 in Accession #:    [id number]           Weight:       177.0 lb Date of Birth:  1955/01/31           BSA:          1.962 m Patient Age:    67 years             BP:           128/81 mmHg Patient Gender: M                    HR:           85 bpm. Exam Location:  Inpatient Procedure: 2D Echo, Cardiac Doppler, Color Doppler and Intracardiac            Opacification Agent Indications:    R07.9* Chest pain, unspecified  History:        Patient has prior history of Echocardiogram examinations, most                 recent 10/31/2021. Acute MI and CAD,  Signs/Symptoms:Chest Pain;                 Risk Factors:Hypertension and Dyslipidemia.  Sonographer:    Garrett Roberts RDCS Referring Phys: [id number] HAO MENG  Sonographer Comments: Patient is post PCIx2. IMPRESSIONS  1. Mid septum to apex is akinetic consistent with LAD infarction. No LV apical thrombus on contrast imaging. Left ventricular ejection fraction, by estimation, is 20 to 25%. The left ventricle has severely decreased function. The left ventricle demonstrates regional wall motion abnormalities (see scoring diagram/findings for description). There is moderate concentric left ventricular hypertrophy. Left ventricular diastolic parameters are consistent with Grade II diastolic dysfunction (pseudonormalization).  2. Right ventricular systolic function is moderately reduced. The right ventricular size is normal.  3. The mitral valve is grossly normal. Mild mitral valve regurgitation. No evidence of mitral stenosis.  4. The aortic valve is tricuspid. Aortic valve regurgitation is trivial. No aortic stenosis is present.  5. Aortic dilatation noted. There is mild dilatation of the ascending aorta, measuring 42 mm.  6. The inferior vena cava is dilated in size with >50% respiratory variability, suggesting right atrial pressure of 8 mmHg. Comparison(s): The left ventricular function is significantly worse. Conclusion(s)/Recommendation(s): Findings consistent with ischemic cardiomyopathy. No left ventricular mural or apical thrombus/thrombi. FINDINGS  Left Ventricle: Mid septum to apex is akinetic consistent with LAD infarction. No LV apical thrombus on contrast imaging. Left ventricular ejection fraction, by estimation, is 20 to 25%. The left ventricle has severely decreased function. The left ventricle demonstrates regional wall motion abnormalities. Definity contrast agent was given IV to delineate the left ventricular endocardial borders. The left ventricular internal cavity size was normal in size. There is moderate  concentric left ventricular hypertrophy. Left ventricular diastolic parameters are consistent with Grade II diastolic dysfunction (pseudonormalization).  LV  Wall Scoring: The mid and distal anterior septum, mid inferoseptal segment, apical anterior segment, apical inferior segment, and apex are akinetic. Right Ventricle: The right ventricular size is normal. No increase in right ventricular wall thickness. Right ventricular systolic function is moderately reduced. Left Atrium: Left atrial size was normal in size. Right Atrium: Right atrial size was normal in size. Pericardium: There is no evidence of pericardial effusion. Mitral Valve: The mitral valve is grossly normal. Mild mitral valve regurgitation. No evidence of mitral valve stenosis. Tricuspid Valve: The tricuspid valve is grossly normal. Tricuspid valve regurgitation is trivial. No evidence of tricuspid stenosis. Aortic Valve: The aortic valve is tricuspid. Aortic valve regurgitation is trivial. No aortic stenosis is present. Pulmonic Valve: The pulmonic valve was grossly normal. Pulmonic valve regurgitation is not visualized. No evidence of pulmonic stenosis. Aorta: Aortic dilatation noted and the aortic root is normal in size and structure. There is mild dilatation of the ascending aorta, measuring 42 mm. Venous: The inferior vena cava is dilated in size with greater than 50% respiratory variability, suggesting right atrial pressure of 8 mmHg. IAS/Shunts: The atrial septum is grossly normal. Additional Comments: There is a small pleural effusion in the left lateral region.  LEFT VENTRICLE PLAX 2D LVIDd:         4.70 cm      Diastology LVIDs:         4.50 cm      LV e' medial:    3.26 cm/s LV PW:         1.30 cm      LV E/e' medial:  26.2 LV IVS:        1.40 cm      LV e' lateral:   9.46 cm/s LVOT diam:     2.20 cm      LV E/e' lateral: 9.0 LV SV:         68 LV SV Index:   35 LVOT Area:     3.80 cm  LV Volumes (MOD) LV vol d, MOD A2C: 128.0 ml LV vol d,  MOD A4C: 88.5 ml LV vol s, MOD A2C: 108.0 ml LV vol s, MOD A4C: 62.9 ml LV SV MOD A2C:     20.0 ml LV SV MOD A4C:     88.5 ml LV SV MOD BP:      24.9 ml RIGHT VENTRICLE             IVC RV S prime:     12.40 cm/s  IVC diam: 2.20 cm TAPSE (M-mode): 1.6 cm LEFT ATRIUM             Index        RIGHT ATRIUM          Index LA diam:        3.10 cm 1.58 cm/m   RA Area:     8.47 cm LA Vol (A2C):   56.4 ml 28.75 ml/m  RA Volume:   15.90 ml 8.11 ml/m LA Vol (A4C):   28.9 ml 14.73 ml/m LA Biplane Vol: 42.4 ml 21.61 ml/m  AORTIC VALVE LVOT Vmax:   111.00 cm/s LVOT Vmean:  69.800 cm/s LVOT VTI:    0.179 m  AORTA Ao Root diam: 3.60 cm Ao Asc diam:  4.10 cm MITRAL VALVE MV Area (PHT): 4.97 cm    SHUNTS MV Decel Time: 153 msec    Systemic VTI:  0.18 m MV E velocity: 85.47 cm/s  Systemic Diam: 2.20 cm MV A velocity:  83.07 cm/s MV E/A ratio:  1.03 Lennie Odor MD Electronically signed by Lennie Odor MD Signature Date/Time: 12/11/2022/12:03:26 PM    Final    CT ABDOMEN PELVIS W CONTRAST  Result Date: 12/10/2022 CLINICAL DATA:  Left upper and lower quadrant abdominal pain. EXAM: CT ABDOMEN AND PELVIS WITH CONTRAST TECHNIQUE: Multidetector CT imaging of the abdomen and pelvis was performed using the standard protocol following bolus administration of intravenous contrast. RADIATION DOSE REDUCTION: This exam was performed according to the departmental dose-optimization program which includes automated exposure control, adjustment of the mA and/or kV according to patient size and/or use of iterative reconstruction technique. CONTRAST:  75mL OMNIPAQUE IOHEXOL 350 MG/ML SOLN COMPARISON:  CT abdomen pelvis no contrast 05/11/2009. FINDINGS: Lower chest: The heart is slightly enlarged. There is calcification in the right coronary artery, small pericardial effusion. There are trace pleural effusions. Interlobular septal thickening is noted in both lung bases consistent with interstitial edema. Findings likely indicating mild CHF or  fluid overload. There are scattered ground-glass opacities which are probably ground-glass edema, less likely ground-glass pneumonitis. Hepatobiliary: No focal liver abnormality is seen. No gallstones, gallbladder wall thickening, or biliary dilatation. Pancreas: No abnormality. Spleen: No abnormality. Adrenals/Urinary Tract: There is no adrenal mass. There is contrast in both renal collecting systems which would obscure intrarenal stones if present. There is no hydronephrosis, hydroureter or ureteral filling defect. There is symmetric contrast excretion on the delayed images. The bladder is filled with contrast. The bladder wall is unremarkable. Stomach/Bowel: There are moderate thickened folds in the lateral proximal to mid stomach consistent with gastritis. This has increased since 2011. Endoscopy may be indicated but no overt masslike wall thickening is seen. The unopacified small bowel is unremarkable. The appendix is normal caliber. There is a tiny stone in the proximal appendix. There is moderate retained stool in the ascending colon. There is either a focal wall thickening or underdistention of the proximal transverse colon on 3: 34-38. Rest of the large bowel wall is largely contracted but otherwise unremarkable. Consider either a short interval follow-up study to see if this persists, BE, or colonoscopy follow-up unless recently done. Vascular/Lymphatic: There is moderate aortoiliac mixed plaque without AAA. No adenopathy is seen. Reproductive: Prostate is unremarkable. Other: No abdominal wall hernia or abnormality. No abdominopelvic ascites. Musculoskeletal: Mild degenerative change lumbar spine. Bridging osteophytes anterior right SI joint. Bone island left femoral head. No acute or other significant osseous findings. IMPRESSION: 1. Moderate thickened folds in the lateral proximal to mid stomach consistent with gastritis. This has increased since 2011. Endoscopy may be indicated but no overt masslike  wall thickening is seen. 2. Focal wall thickening or underdistention of the proximal transverse colon. Consider either a short interval follow-up study to see if this persists, BE, or colonoscopy follow-up unless recently done. 3. Cardiomegaly with trace pleural effusions and interstitial edema in the lung bases. 4. Scattered ground-glass opacities in the lung bases probably ground-glass edema, less likely ground-glass pneumonitis. 5. Aortic and coronary artery atherosclerosis. 6. Moderate retained stool in the ascending colon. Aortic Atherosclerosis (ICD10-I70.0). Electronically Signed   By: Almira Bar M.D.   On: 12/10/2022 23:09   CARDIAC CATHETERIZATION  Result Date: 12/10/2022   2nd Diag lesion is 100% stenosed. 1.  Acute anterior STEMI, secondary to total occlusion of the proximal LAD.  Primary PCI performed with a 3.5 x 38 mm Synergy DES.  Baseline stenosis 100% with TIMI 0 flow.  Post-PCI stenosis 0% with TIMI-3 flow.  The second diagonal branch  is totally occluded both pre and post PCI.  This branch was previously shown to be severely diseased. 2.  Continued patency of the mid RCA stent with moderate distal RCA stenosis of 60 to 70% 3.  Severe stenosis of a tortuous ramus intermedius branch, unchanged from the previous procedure 4.  Severe diffuse distal circumflex stenosis and a diabetic distal diffuse pattern 5.  Moderately elevated LVEDP Recommendations: Resume aspirin and ticagrelor to be continued without interruption for at least 12 months.  Consider long-term P2 Y12 inhibition in this diabetic patient with extensive coronary artery disease, now presenting with a second STEMI event in a different vessel.  Otherwise routine post MI medical therapy, aggressive risk reduction measures, and check inpatient 2D echocardiogram to assess degree of LV dysfunction.   DG Chest 2 View  Result Date: 12/10/2022 CLINICAL DATA:  Chest pain and shortness of breath EXAM: CHEST - 2 VIEW COMPARISON:  Chest  x-ray 11/30/2021 FINDINGS: There are increased central interstitial markings bilaterally. There is a left upper lobe nodule measuring 3 mm, indeterminate. There is no lung consolidation, pleural effusion or pneumothorax. The cardiomediastinal silhouette is within normal limits. No acute fractures are seen. IMPRESSION: 1. Increased central interstitial markings bilaterally may represent bronchitis or atypical infection. 2. Left upper lobe nodule measuring 3 mm, indeterminate. No follow-up needed if patient is low-risk. Electronically Signed   By: Darliss Cheney M.D.   On: 12/10/2022 16:24    Cardiac Studies   2D echo (12/11/2022)  IMPRESSIONS     1. Mid septum to apex is akinetic consistent with LAD infarction. No LV  apical thrombus on contrast imaging. Left ventricular ejection fraction,  by estimation, is 20 to 25%. The left ventricle has severely decreased  function. The left ventricle  demonstrates regional wall motion abnormalities (see scoring  diagram/findings for description). There is moderate concentric left  ventricular hypertrophy. Left ventricular diastolic parameters are  consistent with Grade II diastolic dysfunction  (pseudonormalization).   2. Right ventricular systolic function is moderately reduced. The right  ventricular size is normal.   3. The mitral valve is grossly normal. Mild mitral valve regurgitation.  No evidence of mitral stenosis.   4. The aortic valve is tricuspid. Aortic valve regurgitation is trivial.  No aortic stenosis is present.   5. Aortic dilatation noted. There is mild dilatation of the ascending  aorta, measuring 42 mm.   6. The inferior vena cava is dilated in size with >50% respiratory  variability, suggesting right atrial pressure of 8 mmHg.   Cardiac catheterization/PCI and stent (12/10/2022)  Conclusion      2nd Diag lesion is 100% stenosed.   1.  Acute anterior STEMI, secondary to total occlusion of the proximal LAD.  Primary PCI  performed with a 3.5 x 38 mm Synergy DES.  Baseline stenosis 100% with TIMI 0 flow.  Post-PCI stenosis 0% with TIMI-3 flow.  The second diagonal branch is totally occluded both pre and post PCI.  This branch was previously shown to be severely diseased. 2.  Continued patency of the mid RCA stent with moderate distal RCA stenosis of 60 to 70% 3.  Severe stenosis of a tortuous ramus intermedius branch, unchanged from the previous procedure 4.  Severe diffuse distal circumflex stenosis and a diabetic distal diffuse pattern 5.  Moderately elevated LVEDP   Recommendations: Resume aspirin and ticagrelor to be continued without interruption for at least 12 months.  Consider long-term P2 Y12 inhibition in this diabetic patient with extensive coronary artery  disease, now presenting with a second STEMI event in a different vessel.  Otherwise routine post MI medical therapy, aggressive risk reduction measures, and check inpatient 2D echocardiogram to assess degree of LV dysfunction.  Comparison(s): The left ventricular function is significantly worse  nostic Dominance: Right  Intervention      Patient Profile     67 y.o. male with  hypertension, hyperlipidemia, DM2, Mobitz 1, and history of CAD who is being seen 12/10/2022 for the evaluation of chest pain.   Assessment & Plan    1: Anterior STEMI-patient presented with anterior STEMI on 12/10/2022.  He had had an inferior STEMI a year before and apparently was not compliant with his medications.  Dr. Excell Seltzer implanted a long drug-eluting stent in the ostial/proximal LAD with excellent result.  His RCA stent was patent.  Given his history of noncompliance we will transition him from ticagrelor to clopidogrel along with baby aspirin.  He will need uninterrupted DAPT for at least 12 months if not long-term DAPT.  2: Abdominal pain-seen by gastroenterology because of abdominal pain and prominent fold in his stomach by CT.  Conservative therapy was  recommended including twice daily PPI.  3: Essential hypertension-on metoprolol tartrate.  Blood pressure soft.  Was on losartan as an outpatient which we will hold pending improvement in his blood pressure.  4: Diabetes-hemoglobin A1c 11.2.  On Jardiance at home.  On insulin as well.  Will get diabetes coordinator to assist in his diabetic care.  5: Dyslipidemia-lipid profile on admission revealed total cholesterol 212 and LDL of 146.  He was noncompliant with his statin at home.  Currently on atorvastatin 80 and Zetia.  LDL goal in the 50-60 range.  6: Ischemic cardiomyopathy-EF by echo in the 25% range.  This is reduced from his prior EF in the 45% range after his inferior STEMI.  Blood pressure too low for GDMT.  Will continue beta-blocker and consider adding his ARB back once his hemodynamics stabilize.  7: A-fib with RVR-initially his A-fib which is new with a heart rate of 145 resulting in hypotension.  He is currently on amiodarone and heparin.  His heart rate is down into the 1 teens as blood pressure has improved.  Hopefully he will convert pharmacologically.  He will need to be on a DOAC postconversion.  Postop day 2 anterior STEMI.  Currently in A-fib.  Blood pressure soft.  Will keep in the unit until he converts his blood pressure normalizes.    For questions or updates, please contact [facility] Please consult [email or web address] for contact info under        Signed, Nanetta Batty, MD  12/12/2022, 8:53 AM

## 2022-12-12 NOTE — Progress Notes (Signed)
   Heart Failure Stewardship Pharmacist Progress Note   PCP: Pcp, No PCP-Cardiologist: None    HPI:  67 yo M with PMH of CAD, HTN, HLD, T2DM, and mobitz 1.   Had a STEMI on 10/30/21. Cardiac cath with 100% stenosis in RCA s/p 1 DES. EF improved from 40-45% to 55-60%.   Presented to the ED on 11/30 with chest pain and nausea/vomiting. CXR with increased central interstitial markings bilaterally. EKG with ST elevations. Taken for emergent cath and found to have total occlusion to prox LAD s/p 1 DES. ECHO showed LVEF 20-25%, no apical thrombus, RWMA, moderate concentric LVH, G2DD, RV moderately reduced, mild MR, trivial AR. Reports noncompliance with medications due to lack of insurance.   Reports that he still feels short of breath and is on 2L oxygen. States that when he falls asleep he feels like he stops breathing and is jolted awake gasping for air.   Current HF Medications: Beta Blocker: metoprolol tartrate 25 mg BID SGLT2i: Jardiance 25 mg daily  Prior to admission HF Medications: None - not taking PTA metoprolol, losartan, or Jardiance  Pertinent Lab Values: Serum creatinine 1.17, BUN 15, Potassium 4.3, Sodium 133, A1c 11.2   Vital Signs: Weight: 176 lbs (admission weight: 177 lbs) Blood pressure: 90/70s  Heart rate: 110s  I/O: net -0.5L since admission  Medication Assistance / Insurance Benefits Check: Does the patient have prescription insurance?  No  Does the patient qualify for medication assistance through manufacturers or grants?   Pending Eligible grants and/or patient assistance programs: pending Medication assistance applications in progress: none  Medication assistance applications approved: none Approved medication assistance renewals will be completed by: pending  Outpatient Pharmacy:  Prior to admission outpatient [facility]: [facility] Is the patient willing to use [facility] pharmacy at discharge? Yes Is the patient willing to transition their outpatient pharmacy  to utilize a [facility] outpatient pharmacy?   Yes    Assessment: 1. Acute on chronic systolic CHF (LVEF 20-25%), due to ICM. NYHA class II symptoms. Will notify the team about his concerns with his respirations during sleep.  - Not volume overloaded on exam - Continue metoprolol tartrate 25 mg BID, will need to consolidate to metoprolol XL before discharge - BP too soft to add ARB/ARNI or MRA - Continue Jardiance 25 mg daily  Plan: 1) Medication changes recommended at this time: - Consolidate to metoprolol XL before discharge  2) Patient assistance: - Uninsured - will ask RNCM to involve financial counselors - May need MATCH at discharge + patient assistance  3)  Education  - Patient has been educated on current HF medications and potential additions to HF medication regimen - Patient verbalizes understanding that over the next few months, these medication doses may change and more medications may be added to optimize HF regimen - Patient has been educated on basic disease state pathophysiology and goals of therapy   Sharen Hones, PharmD, BCPS Heart Failure Engineer, building services Phone [phone]

## 2022-12-12 NOTE — Progress Notes (Signed)
Pt Rhythm converted to atrial fibrillation with rapid ventricular response 140's. Pt without complaints of pain and unaware of change. Vitals per flowsheet. Pt placed on 2L O2 and instructed to remain in bed. Cardiology MD paged awaiting callback.

## 2022-12-13 ENCOUNTER — Encounter (HOSPITAL_COMMUNITY): Payer: Self-pay | Admitting: Cardiovascular Disease

## 2022-12-13 DIAGNOSIS — I480 Paroxysmal atrial fibrillation: Secondary | ICD-10-CM

## 2022-12-13 LAB — BASIC METABOLIC PANEL
Anion gap: 13 (ref 5–15)
BUN: 25 mg/dL — ABNORMAL HIGH (ref 8–23)
CO2: 19 mmol/L — ABNORMAL LOW (ref 22–32)
Calcium: 9.2 mg/dL (ref 8.9–10.3)
Chloride: 100 mmol/L (ref 98–111)
Creatinine, Ser: 1.19 mg/dL (ref 0.61–1.24)
GFR, Estimated: >60 mL/min (ref >60)
Glucose, Bld: 111 mg/dL — ABNORMAL HIGH (ref 70–99)
Potassium: 4.6 mmol/L (ref 3.5–5.1)
Sodium: 132 mmol/L — ABNORMAL LOW (ref 135–145)

## 2022-12-13 LAB — HEPATIC FUNCTION PANEL
ALT: 61 U/L — ABNORMAL HIGH (ref 0–44)
AST: 96 U/L — ABNORMAL HIGH (ref 15–41)
Albumin: 3.2 g/dL — ABNORMAL LOW (ref 3.5–5.0)
Alkaline Phosphatase: 58 U/L (ref 38–126)
Bilirubin, Direct: 0.3 mg/dL — ABNORMAL HIGH (ref 0.0–0.2)
Indirect Bilirubin: 0.7 mg/dL (ref 0.3–0.9)
Total Bilirubin: 1 mg/dL (ref <1.2)
Total Protein: 7.2 g/dL (ref 6.5–8.1)

## 2022-12-13 LAB — HEPARIN LEVEL (UNFRACTIONATED): Heparin Unfractionated: 0.78 [IU]/mL — ABNORMAL HIGH (ref 0.30–0.70)

## 2022-12-13 LAB — GLUCOSE, CAPILLARY
Glucose-Capillary: 90 mg/dL (ref 70–99)
Glucose-Capillary: 224 mg/dL — ABNORMAL HIGH (ref 70–99)
Glucose-Capillary: 206 mg/dL — ABNORMAL HIGH (ref 70–99)
Glucose-Capillary: 141 mg/dL — ABNORMAL HIGH (ref 70–99)

## 2022-12-13 LAB — MAGNESIUM: Magnesium: 2.1 mg/dL (ref 1.7–2.4)

## 2022-12-13 MED ORDER — APIXABAN 5 MG PO TABS
5.0000 mg | ORAL_TABLET | Freq: Two times a day (BID) | ORAL | Status: DC
  Administered 2022-12-13 – 2022-12-15 (×5): 5 mg via ORAL
  Filled 2022-12-13 (×5): qty 1

## 2022-12-13 MED ORDER — METOPROLOL SUCCINATE ER 25 MG PO TB24
12.5000 mg | ORAL_TABLET | Freq: Every day | ORAL | Status: DC
  Administered 2022-12-13: 12.5 mg via ORAL
  Filled 2022-12-13: qty 1

## 2022-12-13 MED ORDER — AMIODARONE HCL 200 MG PO TABS
200.0000 mg | ORAL_TABLET | Freq: Two times a day (BID) | ORAL | Status: DC
  Administered 2022-12-13 (×2): 200 mg via ORAL
  Filled 2022-12-13 (×2): qty 1

## 2022-12-13 NOTE — Discharge Instructions (Signed)
Information on my medicine - ELIQUIS (apixaban)  This medication education was reviewed with me or my healthcare representative as part of my discharge preparation.  The pharmacist that spoke with me during my hospital stay was:  Einar Grad, [facility]  Why was Eliquis prescribed for you? Eliquis was prescribed for you to reduce the risk of a blood clot forming that can cause a stroke if you have a medical condition called atrial fibrillation (a type of irregular heartbeat).  What do You need to know about Eliquis ? Take your Eliquis TWICE DAILY - one tablet in the morning and one tablet in the evening with or without food. If you have difficulty swallowing the tablet whole please discuss with your pharmacist how to take the medication safely.  Take Eliquis exactly as prescribed by your doctor and DO NOT stop taking Eliquis without talking to the doctor who prescribed the medication.  Stopping may increase your risk of developing a stroke.  Refill your prescription before you run out.  After discharge, you should have regular check-up appointments with your healthcare provider that is prescribing your Eliquis.  In the future your dose may need to be changed if your kidney function or weight changes by a significant amount or as you get older.  What do you do if you miss a dose? If you miss a dose, take it as soon as you remember on the same day and resume taking twice daily.  Do not take more than one dose of ELIQUIS at the same time to make up a missed dose.  Important Safety Information A possible side effect of Eliquis is bleeding. You should call your healthcare provider right away if you experience any of the following: Bleeding from an injury or your nose that does not stop. Unusual colored urine (red or dark brown) or unusual colored stools (red or black). Unusual bruising for unknown reasons. A serious fall or if you hit your head (even if there is no bleeding).  Some  medicines may interact with Eliquis and might increase your risk of bleeding or clotting while on Eliquis. To help avoid this, consult your healthcare provider or pharmacist prior to using any new prescription or non-prescription medications, including herbals, vitamins, non-steroidal anti-inflammatory drugs (NSAIDs) and supplements.  This website has more information on Eliquis (apixaban): http://[email or web address].com/eliquis/home

## 2022-12-13 NOTE — Progress Notes (Signed)
Heart Failure Nurse Navigator Progress Note  PCP: Pcp, No PCP-Cardiologist: Ganji Admission Diagnosis: STEMI, Chest pain Admitted from: Home  Presentation:   Casen Hamon presented with chest pain, shortness of breath and nausea, BP 142/96, HR 73, Troponin >24,000, EKG with ST elevations, taking to Cath lab emergently found to have a total occulusion to prox LAD s/p 1 DES. CXR with increased central interstitial markings bilaterally.  Patient and brother were educated on the sign and symptoms of heart failure, daily weights, when to call his doctor or go to the ED, diet/ fluid restrictions, taking all medications as prescribed and attending all medical appointments. Patient verbalized his understanding of education, a HF TOC appointment was scheduled for 12/27/2022 @ 2 pm. .   ECHO/ LVEF: 20-25%  Clinical Course:  Past Medical History:  Diagnosis Date   Coronary artery disease    Diabetes mellitus without complication (HCC)    Hypertension      Social History   Socioeconomic History   Marital status: Married    Spouse name: Not on file   Number of children: Not on file   Years of education: Not on file   Highest education level: Not on file  Occupational History   Not on file  Tobacco Use   Smoking status: Never   Smokeless tobacco: Not on file  Vaping Use   Vaping status: Never Used  Substance and Sexual Activity   Alcohol use: No   Drug use: No   Sexual activity: Not on file  Other Topics Concern   Not on file  Social History Narrative   ** Merged History Encounter **       Social Determinants of Health   Financial Resource Strain: Not on file  Food Insecurity: No Food Insecurity (12/10/2022)   Hunger Vital Sign    Worried About Running Out of Food in the Last Year: Never true    Ran Out of Food in the Last Year: Never true  Transportation Needs: No Transportation Needs (12/10/2022)   PRAPARE - Administrator, Civil Service (Medical): No     Lack of Transportation (Non-Medical): No  Physical Activity: Sufficiently Active (11/27/2019)   Received [facility], [facility]   Exercise Vital Sign    Days of Exercise per Week: 4 days    Minutes of Exercise per Session: 60 min  Stress: Stress Concern Present (11/27/2019)   Received from [facility], [facility] Occupational [facility] - Occupational Stress Questionnaire    Feeling of Stress : To some extent  Social Connections: Unknown (05/25/2021)   Received from [facility], [facility]   Social Network    Social Network: Not on file   Education Assessment and Provision:  Detailed education and instructions provided on heart failure disease management including the following:  Signs and symptoms of Heart Failure When to call the physician Importance of daily weights Low sodium diet Fluid restriction Medication management Anticipated future follow-up appointments  Patient education given on each of the above topics.  Patient acknowledges understanding via teach back method and acceptance of all instructions.  Education Materials:  "Living Better With Heart Failure" Booklet, HF zone tool, & Daily Weight Tracker Tool.  Patient has scale at home: yes Patient has pill box at home: yes    High Risk Criteria for Readmission and/or Poor Patient Outcomes: Heart failure hospital admissions (last 6 months): 0  No Show rate: 9% Difficult social situation: No, lives with his  wife Demonstrates medication adherence: No, due to medication costs Primary Language: english Literacy level: reading, writing, and comprehension  Barriers of Care:   Medication compliance due to medication costs. No Insurance per patient  Considerations/Referrals:   Referral made to Heart Failure Pharmacist Stewardship: yes Referral made to Heart Failure CSW/NCM TOC: No Referral made to Heart & Vascular TOC clinic: Yes, 12/27/2022 @ 2 pm  Items for Follow-up on  DC/TOC: Medication compliance/ costs Diet/ fluid restrictions ( salt) eat out Daily weights   Rhae Hammock, BSN, RN Heart Failure Teacher, adult education Only

## 2022-12-13 NOTE — Progress Notes (Signed)
PHARMACY - ANTICOAGULATION CONSULT NOTE  Pharmacy Consult for heparin Indication: atrial fibrillation  Allergies  Allergen Reactions   Beef-Derived Products Other (See Comments)   Pork-Derived Products     Patient Measurements: Height: 5\' 9"  (175.3 cm) Weight: 80.1 kg (176 lb 9.4 oz) IBW/kg (Calculated) : 70.7 Heparin Dosing Weight: 80kg  Vital Signs: Temp: 97.5 F (36.4 C) (12/03 0000) Temp Source: Oral (12/03 0000) BP: 79/67 (12/03 0000) Pulse Rate: 99 (12/03 0000)  Labs: Recent Labs    12/10/22 1545 12/10/22 1648 12/10/22 1837 12/11/22 0301 12/12/22 1542 12/13/22 0015  HGB 16.5  --   --  17.2*  --   --   HCT 50.6  --   --  51.8  --   --   PLT 259  --   --  275  --   --   APTT  --  24  --   --   --   --   LABPROT  --  15.1  --   --   --   --   INR  --  1.2  --   --   --   --   HEPARINUNFRC  --   --   --   --  0.65 0.78*  CREATININE 1.14  --   --  1.17  --  1.19  TROPONINIHS 137*  --  >24,000*  --   --   --     Estimated Creatinine Clearance: 60.2 mL/min (by C-G formula based on SCr of 1.19 mg/dL).   Medical History: Past Medical History:  Diagnosis Date   Coronary artery disease    Diabetes mellitus without complication (HCC)    Hypertension      Assessment: 19 yoM admitted with STEMI now with AF RVR. Pharmacy consulted to dose IV heparin. Pt is not on AC PTA. Will defer bolus given recent cath. Noted pork allergy in chart but pt received heparin in cath without any noted issues.  Heparin level 0.65 (therapeutic) on heparin at 1200 units/hr. No bleeding noted.  12/3 AM update:  Heparin level supra-therapeutic   Goal of Therapy:  Heparin level 0.3-0.7 units/ml Monitor platelets by anticoagulation protocol: Yes   Plan:  Dec heparin to 1050 units/hr 8 hour heparin level  Abran Duke, PharmD, BCPS Clinical Pharmacist Phone: [phone]

## 2022-12-13 NOTE — Plan of Care (Signed)
  Problem: Education: Goal: Knowledge of General Education information will improve Description: Including pain rating scale, medication(s)/side effects and non-pharmacologic comfort measures Outcome: Progressing   Problem: Health Behavior/Discharge Planning: Goal: Ability to manage health-related needs will improve Outcome: Progressing   Problem: Clinical Measurements: Goal: Ability to maintain clinical measurements within normal limits will improve Outcome: Progressing Goal: Will remain free from infection Outcome: Progressing Goal: Diagnostic test results will improve Outcome: Progressing Goal: Respiratory complications will improve Outcome: Progressing Goal: Cardiovascular complication will be avoided Outcome: Progressing   Problem: Activity: Goal: Risk for activity intolerance will decrease Outcome: Progressing   Problem: Nutrition: Goal: Adequate nutrition will be maintained Outcome: Progressing   Problem: Coping: Goal: Level of anxiety will decrease Outcome: Progressing   Problem: Elimination: Goal: Will not experience complications related to bowel motility Outcome: Progressing Goal: Will not experience complications related to urinary retention Outcome: Progressing   Problem: Pain Management: Goal: General experience of comfort will improve Outcome: Progressing   Problem: Safety: Goal: Ability to remain free from injury will improve Outcome: Progressing   Problem: Skin Integrity: Goal: Risk for impaired skin integrity will decrease Outcome: Progressing   Problem: Education: Goal: Ability to describe self-care measures that may prevent or decrease complications (Diabetes Survival Skills Education) will improve Outcome: Progressing Goal: Individualized Educational Video(s) Outcome: Progressing   Problem: Coping: Goal: Ability to adjust to condition or change in health will improve Outcome: Progressing   Problem: Fluid Volume: Goal: Ability to  maintain a balanced intake and output will improve Outcome: Progressing   Problem: Health Behavior/Discharge Planning: Goal: Ability to identify and utilize available resources and services will improve Outcome: Progressing Goal: Ability to manage health-related needs will improve Outcome: Progressing   Problem: Metabolic: Goal: Ability to maintain appropriate glucose levels will improve Outcome: Progressing   Problem: Nutritional: Goal: Maintenance of adequate nutrition will improve Outcome: Progressing Goal: Progress toward achieving an optimal weight will improve Outcome: Progressing   Problem: Skin Integrity: Goal: Risk for impaired skin integrity will decrease Outcome: Progressing   Problem: Tissue Perfusion: Goal: Adequacy of tissue perfusion will improve Outcome: Progressing   Problem: Education: Goal: Understanding of cardiac disease, CV risk reduction, and recovery process will improve Outcome: Progressing Goal: Individualized Educational Video(s) Outcome: Progressing   Problem: Activity: Goal: Ability to tolerate increased activity will improve Outcome: Progressing   Problem: Cardiac: Goal: Ability to achieve and maintain adequate cardiovascular perfusion will improve Outcome: Progressing   Problem: Health Behavior/Discharge Planning: Goal: Ability to safely manage health-related needs after discharge will improve Outcome: Progressing   Problem: Education: Goal: Understanding of CV disease, CV risk reduction, and recovery process will improve Outcome: Progressing Goal: Individualized Educational Video(s) Outcome: Progressing   Problem: Activity: Goal: Ability to return to baseline activity level will improve Outcome: Progressing   Problem: Cardiovascular: Goal: Ability to achieve and maintain adequate cardiovascular perfusion will improve Outcome: Progressing Goal: Vascular access site(s) Level 0-1 will be maintained Outcome: Progressing    Problem: Health Behavior/Discharge Planning: Goal: Ability to safely manage health-related needs after discharge will improve Outcome: Progressing

## 2022-12-13 NOTE — Plan of Care (Signed)
  Problem: Cardiovascular: Goal: Vascular access site(s) Level 0-1 will be maintained Outcome: Progressing   Problem: Clinical Measurements: Goal: Diagnostic test results will improve Outcome: Progressing   Problem: Elimination: Goal: Will not experience complications related to bowel motility Outcome: Progressing   Problem: Activity: Goal: Risk for activity intolerance will decrease Outcome: Progressing   Problem: Education: Goal: Knowledge of General Education information will improve Description: Including pain rating scale, medication(s)/side effects and non-pharmacologic comfort measures Outcome: Progressing

## 2022-12-13 NOTE — Progress Notes (Signed)
CARDIAC REHAB PHASE I   PRE:  Rate/Rhythm: 108 a-fib  BP:  Sitting: 107/75      SaO2: 97 RA  MODE:  Ambulation: 370 ft   POST:  Rate/Rhythm: 115 A-fib  BP:  Sitting: 107/75      SaO2: 96 RA  Pt ambulated in hallway with steady assistance. Tolerated well with no CP,SOB or dizziness. Returned to bed with call bell in reach. Post MI/stent education including restrictions, risk factors, exercise guidelines, antiplatelet therapy importance, MI booklet, NTG use, heart healthy diabetic diet and CRP2 reviewed. All questions and concerns addressed. Will refer to [facility] for CRP2. Will continue to follow.   [phone] Woodroe Chen, RN BSN 12/13/2022 10:22 AM

## 2022-12-13 NOTE — Progress Notes (Signed)
Rounding Note    Patient Name: Garrett Roberts Date of Encounter: 12/13/2022  [facility] Cardiologist: Dr. Jeanella Cara  Subjective   Postop day 3 anterior STEMI treated with PCI and drug-eluting stenting by Dr. Excell Seltzer.  Patient currently pain-free.  Inpatient Medications    Scheduled Meds:  amiodarone  200 mg Oral BID   apixaban  5 mg Oral BID   aspirin  81 mg Oral Daily   atorvastatin  80 mg Oral QHS   Chlorhexidine Gluconate Cloth  6 each Topical Daily   clopidogrel  75 mg Oral Daily   empagliflozin  25 mg Oral QAC breakfast   ezetimibe  10 mg Oral Daily   insulin aspart  0-15 Units Subcutaneous TID WC   insulin aspart  0-5 Units Subcutaneous QHS   insulin detemir  5 Units Subcutaneous BID   melatonin  5 mg Oral QHS   metoprolol succinate  12.5 mg Oral QHS   pantoprazole  40 mg Oral BID   polyethylene glycol  17 g Oral BID   senna  2 tablet Oral Daily   sodium chloride flush  3 mL Intravenous Q12H   Continuous Infusions:   PRN Meds: acetaminophen, ALPRAZolam, nitroGLYCERIN, ondansetron (ZOFRAN) IV, sodium chloride flush   Vital Signs    Vitals:   12/13/22 0400 12/13/22 0500 12/13/22 0600 12/13/22 0700  BP: 94/80 90/66 91/75  108/81  Pulse: 92 91 89 93  Resp: (!) 0 (!) 25 17 (!) 27  Temp: (!) 97.5 F (36.4 C)     TempSrc: Oral     SpO2: 99% 100% 100% 100%  Weight:   78.1 kg   Height:        Intake/Output Summary (Last 24 hours) at 12/13/2022 0838 Last data filed at 12/13/2022 0700 Gross per 24 hour  Intake 854.84 ml  Output 1200 ml  Net -345.16 ml      12/13/2022    6:00 AM 12/12/2022    6:15 AM 12/10/2022    3:41 PM  Last 3 Weights  Weight (lbs) 172 lb 1.6 oz 176 lb 9.4 oz 177 lb 0.5 oz  Weight (kg) 78.064 kg 80.1 kg 80.3 kg      Telemetry    A-fib with RVR- Personally Reviewed  ECG    Not performed today-personally Reviewed  Physical Exam   GEN: No acute distress.   Neck: No JVD Cardiac: Irregularly irregular, no  murmurs, rubs, or gallops.  Respiratory: Clear to auscultation bilaterally. GI: Soft, nontender, non-distended  MS: No edema; No deformity. Neuro:  Nonfocal  Psych: Normal affect   Labs    High Sensitivity Troponin:   Recent Labs  Lab 12/10/22 1545 12/10/22 1837  TROPONINIHS 137* >24,000*     Chemistry Recent Labs  Lab 12/10/22 1545 12/11/22 0301 12/13/22 0015  NA 130* 133* 132*  K 3.8 4.3 4.6  CL 97* 97* 100  CO2 23 20* 19*  GLUCOSE 463* 340* 111*  BUN 16 15 25*  CREATININE 1.14 1.17 1.19  CALCIUM 9.2 9.5 9.2  MG  --   --  2.1  PROT  --   --  7.2  ALBUMIN  --   --  3.2*  AST  --   --  96*  ALT  --   --  61*  ALKPHOS  --   --  58  BILITOT  --   --  1.0  GFRNONAA >60 >60 >60  ANIONGAP 10 16* 13    Lipids  Recent Labs  Lab 12/10/22 1648  CHOL 212*  TRIG 113  HDL 43  LDLCALC 146*  CHOLHDL 4.9    Hematology Recent Labs  Lab 12/10/22 1545 12/11/22 0301  WBC 8.4 15.4*  RBC 5.74 5.91*  HGB 16.5 17.2*  HCT 50.6 51.8  MCV 88.2 87.6  MCH 28.7 29.1  MCHC 32.6 33.2  RDW 11.9 12.2  PLT 259 275   Thyroid No results for input(s): "TSH", "FREET4" in the last 168 hours.  BNPNo results for input(s): "BNP", "PROBNP" in the last 168 hours.  DDimer No results for input(s): "DDIMER" in the last 168 hours.   Radiology    ECHOCARDIOGRAM COMPLETE  Result Date: 12/11/2022    ECHOCARDIOGRAM REPORT   Patient Name:   Garrett Roberts Date of Exam: 12/11/2022 Medical Rec #:  [id number]            Height:       69.0 in Accession #:    [id number]           Weight:       177.0 lb Date of Birth:  28-Feb-1955           BSA:          1.962 m Patient Age:    67 years             BP:           128/81 mmHg Patient Gender: M                    HR:           85 bpm. Exam Location:  Inpatient Procedure: 2D Echo, Cardiac Doppler, Color Doppler and Intracardiac            Opacification Agent Indications:    R07.9* Chest pain, unspecified  History:        Patient has prior history of  Echocardiogram examinations, most                 recent 10/31/2021. Acute MI and CAD, Signs/Symptoms:Chest Pain;                 Risk Factors:Hypertension and Dyslipidemia.  Sonographer:    Sheralyn Boatman RDCS Referring Phys: [id number] HAO MENG  Sonographer Comments: Patient is post PCIx2. IMPRESSIONS  1. Mid septum to apex is akinetic consistent with LAD infarction. No LV apical thrombus on contrast imaging. Left ventricular ejection fraction, by estimation, is 20 to 25%. The left ventricle has severely decreased function. The left ventricle demonstrates regional wall motion abnormalities (see scoring diagram/findings for description). There is moderate concentric left ventricular hypertrophy. Left ventricular diastolic parameters are consistent with Grade II diastolic dysfunction (pseudonormalization).  2. Right ventricular systolic function is moderately reduced. The right ventricular size is normal.  3. The mitral valve is grossly normal. Mild mitral valve regurgitation. No evidence of mitral stenosis.  4. The aortic valve is tricuspid. Aortic valve regurgitation is trivial. No aortic stenosis is present.  5. Aortic dilatation noted. There is mild dilatation of the ascending aorta, measuring 42 mm.  6. The inferior vena cava is dilated in size with >50% respiratory variability, suggesting right atrial pressure of 8 mmHg. Comparison(s): The left ventricular function is significantly worse. Conclusion(s)/Recommendation(s): Findings consistent with ischemic cardiomyopathy. No left ventricular mural or apical thrombus/thrombi. FINDINGS  Left Ventricle: Mid septum to apex is akinetic consistent with LAD infarction. No LV apical thrombus on contrast imaging. Left ventricular ejection fraction, by estimation, is 20 to 25%.  The left ventricle has severely decreased function. The left ventricle demonstrates regional wall motion abnormalities. Definity contrast agent was given IV to delineate the left ventricular endocardial  borders. The left ventricular internal cavity size was normal in size. There is moderate concentric left ventricular hypertrophy. Left ventricular diastolic parameters are consistent with Grade II diastolic dysfunction (pseudonormalization).  LV Wall Scoring: The mid and distal anterior septum, mid inferoseptal segment, apical anterior segment, apical inferior segment, and apex are akinetic. Right Ventricle: The right ventricular size is normal. No increase in right ventricular wall thickness. Right ventricular systolic function is moderately reduced. Left Atrium: Left atrial size was normal in size. Right Atrium: Right atrial size was normal in size. Pericardium: There is no evidence of pericardial effusion. Mitral Valve: The mitral valve is grossly normal. Mild mitral valve regurgitation. No evidence of mitral valve stenosis. Tricuspid Valve: The tricuspid valve is grossly normal. Tricuspid valve regurgitation is trivial. No evidence of tricuspid stenosis. Aortic Valve: The aortic valve is tricuspid. Aortic valve regurgitation is trivial. No aortic stenosis is present. Pulmonic Valve: The pulmonic valve was grossly normal. Pulmonic valve regurgitation is not visualized. No evidence of pulmonic stenosis. Aorta: Aortic dilatation noted and the aortic root is normal in size and structure. There is mild dilatation of the ascending aorta, measuring 42 mm. Venous: The inferior vena cava is dilated in size with greater than 50% respiratory variability, suggesting right atrial pressure of 8 mmHg. IAS/Shunts: The atrial septum is grossly normal. Additional Comments: There is a small pleural effusion in the left lateral region.  LEFT VENTRICLE PLAX 2D LVIDd:         4.70 cm      Diastology LVIDs:         4.50 cm      LV e' medial:    3.26 cm/s LV PW:         1.30 cm      LV E/e' medial:  26.2 LV IVS:        1.40 cm      LV e' lateral:   9.46 cm/s LVOT diam:     2.20 cm      LV E/e' lateral: 9.0 LV SV:         68 LV SV  Index:   35 LVOT Area:     3.80 cm  LV Volumes (MOD) LV vol d, MOD A2C: 128.0 ml LV vol d, MOD A4C: 88.5 ml LV vol s, MOD A2C: 108.0 ml LV vol s, MOD A4C: 62.9 ml LV SV MOD A2C:     20.0 ml LV SV MOD A4C:     88.5 ml LV SV MOD BP:      24.9 ml RIGHT VENTRICLE             IVC RV S prime:     12.40 cm/s  IVC diam: 2.20 cm TAPSE (M-mode): 1.6 cm LEFT ATRIUM             Index        RIGHT ATRIUM          Index LA diam:        3.10 cm 1.58 cm/m   RA Area:     8.47 cm LA Vol (A2C):   56.4 ml 28.75 ml/m  RA Volume:   15.90 ml 8.11 ml/m LA Vol (A4C):   28.9 ml 14.73 ml/m LA Biplane Vol: 42.4 ml 21.61 ml/m  AORTIC VALVE LVOT Vmax:   111.00 cm/s LVOT Vmean:  69.800 cm/s LVOT VTI:    0.179 m  AORTA Ao Root diam: 3.60 cm Ao Asc diam:  4.10 cm MITRAL VALVE MV Area (PHT): 4.97 cm    SHUNTS MV Decel Time: 153 msec    Systemic VTI:  0.18 m MV E velocity: 85.47 cm/s  Systemic Diam: 2.20 cm MV A velocity: 83.07 cm/s MV E/A ratio:  1.03 Lennie Odor MD Electronically signed by Lennie Odor MD Signature Date/Time: 12/11/2022/12:03:26 PM    Final     Cardiac Studies   2D echo (12/11/2022)  IMPRESSIONS     1. Mid septum to apex is akinetic consistent with LAD infarction. No LV  apical thrombus on contrast imaging. Left ventricular ejection fraction,  by estimation, is 20 to 25%. The left ventricle has severely decreased  function. The left ventricle  demonstrates regional wall motion abnormalities (see scoring  diagram/findings for description). There is moderate concentric left  ventricular hypertrophy. Left ventricular diastolic parameters are  consistent with Grade II diastolic dysfunction  (pseudonormalization).   2. Right ventricular systolic function is moderately reduced. The right  ventricular size is normal.   3. The mitral valve is grossly normal. Mild mitral valve regurgitation.  No evidence of mitral stenosis.   4. The aortic valve is tricuspid. Aortic valve regurgitation is trivial.  No aortic  stenosis is present.   5. Aortic dilatation noted. There is mild dilatation of the ascending  aorta, measuring 42 mm.   6. The inferior vena cava is dilated in size with >50% respiratory  variability, suggesting right atrial pressure of 8 mmHg.   Cardiac catheterization/PCI and stent (12/10/2022)  Conclusion      2nd Diag lesion is 100% stenosed.   1.  Acute anterior STEMI, secondary to total occlusion of the proximal LAD.  Primary PCI performed with a 3.5 x 38 mm Synergy DES.  Baseline stenosis 100% with TIMI 0 flow.  Post-PCI stenosis 0% with TIMI-3 flow.  The second diagonal branch is totally occluded both pre and post PCI.  This branch was previously shown to be severely diseased. 2.  Continued patency of the mid RCA stent with moderate distal RCA stenosis of 60 to 70% 3.  Severe stenosis of a tortuous ramus intermedius branch, unchanged from the previous procedure 4.  Severe diffuse distal circumflex stenosis and a diabetic distal diffuse pattern 5.  Moderately elevated LVEDP   Recommendations: Resume aspirin and ticagrelor to be continued without interruption for at least 12 months.  Consider long-term P2 Y12 inhibition in this diabetic patient with extensive coronary artery disease, now presenting with a second STEMI event in a different vessel.  Otherwise routine post MI medical therapy, aggressive risk reduction measures, and check inpatient 2D echocardiogram to assess degree of LV dysfunction.  Comparison(s): The left ventricular function is significantly worse  nostic Dominance: Right  Intervention      Patient Profile     67 y.o. male with  hypertension, hyperlipidemia, DM2, Mobitz 1, and history of CAD who is being seen 12/10/2022 for the evaluation of chest pain.   Assessment & Plan    1: Anterior STEMI-patient presented with anterior STEMI on 12/10/2022.  He had had an inferior STEMI a year before and apparently was not compliant with his medications.  Dr. Excell Seltzer  implanted a long drug-eluting stent in the ostial/proximal LAD with excellent result.  His RCA stent was patent.  Given his history of noncompliance we will transition him from ticagrelor to clopidogrel along with baby aspirin.  He will need uninterrupted DAPT for at least 12 months if not long-term DAPT.  2: Abdominal pain-seen by gastroenterology because of abdominal pain and prominent fold in his stomach by CT.  Conservative therapy was recommended including twice daily PPI.  His abdominal pain has improved.  3: Essential hypertension-on metoprolol tartrate.  Blood pressure soft.  Was on losartan as an outpatient which we will hold pending improvement in his blood pressure.  4: Diabetes-hemoglobin A1c 11.2.  On Jardiance at home.  On insulin as well.  Appreciate diabetes coordinator input.  5: Dyslipidemia-lipid profile on admission revealed total cholesterol 212 and LDL of 146.  He was noncompliant with his statin at home.  Currently on atorvastatin 80 and Zetia.  LDL goal in the 50-60 range.  6: Ischemic cardiomyopathy-EF by echo in the 25% range.  This is reduced from his prior EF in the 45% range after his inferior STEMI.  Blood pressure too low for GDMT.  On Jardiance.  Will continue beta-blocker (metoprolol succinate) and consider adding his ARB back once his hemodynamics stabilize.  7: A-fib with RVR-initially his A-fib which is new with a heart rate of 145 resulting in hypotension.  He is currently on amiodarone and heparin.  His heart rate is down into the low 100 range.  Will transition to Eliquis and p.o. amiodarone.  Hopefully he will convert pharmacologically, otherwise we will consider outpatient DC cardioversion.  Postop day 3 anterior STEMI.  Currently in A-fib.  Blood pressure soft.  Plan transfer to telemetry, medication assistance form affordability.  Cardiac rehab/ambulation.  Anticipate discharge tomorrow if he remains clinically stable.   For questions or updates, please  contact [facility] Please consult [email or web address] for contact info under        Signed, Nanetta Batty, MD  12/13/2022, 8:38 AM

## 2022-12-13 NOTE — Progress Notes (Signed)
   Heart Failure Stewardship Pharmacist Progress Note   PCP: Pcp, No PCP-Cardiologist: None    HPI:  67 yo M with PMH of CAD, HTN, HLD, T2DM, and mobitz 1.   Had a STEMI on 10/30/21. Cardiac cath with 100% stenosis in RCA s/p 1 DES. EF improved from 40-45% to 55-60%.   Presented to the ED on 11/30 with chest pain and nausea/vomiting. CXR with increased central interstitial markings bilaterally. EKG with ST elevations. Taken for emergent cath and found to have total occlusion to prox LAD s/p 1 DES. ECHO showed LVEF 20-25%, no apical thrombus, RWMA, moderate concentric LVH, G2DD, RV moderately reduced, mild MR, trivial AR. Reports noncompliance with medications due to lack of insurance.   Reports improvement in breathing. Still experiencing breathing trouble while sleeping. Discussed with Dr. Allyson Sabal yesterday and thinks this may be obstructive sleep apnea. Recommended for outpatient sleep study. Informed patient.   Current HF Medications: Beta Blocker: metoprolol XL 12.5 mg daily SGLT2i: Jardiance 25 mg daily  Prior to admission HF Medications: None - not taking PTA metoprolol, losartan, or Jardiance  Pertinent Lab Values: Serum creatinine 1.19, BUN 25, Potassium 4.6, Sodium 132, A1c 11.2, Magnesium 2.1  Vital Signs: Weight: 172 lbs (admission weight: 177 lbs) Blood pressure: 90-100/70s  Heart rate: 90s  I/O: net -0.9L since admission  Medication Assistance / Insurance Benefits Check: Does the patient have prescription insurance?  No  Does the patient qualify for medication assistance through manufacturers or grants?   yes Eligible grants and/or patient assistance programs: Eliquis, Jardiance Medication assistance applications in progress: Eliquis, Jardaince  Medication assistance applications approved: none Approved medication assistance renewals will be completed by: [facility]  Outpatient Pharmacy:  Prior to admission outpatient pharmacy: [facility] Is the patient willing to use [facility]  pharmacy at discharge? Yes Is the patient willing to transition their outpatient pharmacy to utilize a [facility] outpatient pharmacy?   Yes    Assessment: 1. Acute on chronic systolic CHF (LVEF 20-25%), due to ICM. NYHA class II symptoms.  - Not volume overloaded on exam - Agree with transitioning to metoprolol XL 12.5 mg daily - dose reduced due to low BP - BP too soft to add ARB/ARNI or MRA - Continue Jardiance 25 mg daily  Plan: 1) Medication changes recommended at this time: - Agree with changes  2) Patient assistance: - Uninsured - RNCM sent message to financial counselors on 12/2 - MATCH at discharge  - Patient assistance applications for Jardiance and Eliquis in progress  3)  Education  - Patient has been educated on current HF medications and potential additions to HF medication regimen - Patient verbalizes understanding that over the next few months, these medication doses may change and more medications may be added to optimize HF regimen - Patient has been educated on basic disease state pathophysiology and goals of therapy   Sharen Hones, PharmD, BCPS Heart Failure Engineer, building services Phone [phone]

## 2022-12-14 ENCOUNTER — Inpatient Hospital Stay (HOSPITAL_COMMUNITY): Payer: MEDICAID | Admitting: Certified Registered Nurse Anesthetist

## 2022-12-14 ENCOUNTER — Other Ambulatory Visit (HOSPITAL_COMMUNITY): Payer: Self-pay

## 2022-12-14 ENCOUNTER — Encounter (HOSPITAL_COMMUNITY)
Admission: EM | Disposition: A | Discharge status: Home / Self Care | Payer: Self-pay | Source: Home / Self Care | Attending: Cardiovascular Disease

## 2022-12-14 DIAGNOSIS — I480 Paroxysmal atrial fibrillation: Secondary | ICD-10-CM

## 2022-12-14 DIAGNOSIS — I4891 Unspecified atrial fibrillation: Secondary | ICD-10-CM

## 2022-12-14 HISTORY — PX: CARDIOVERSION: EP1203

## 2022-12-14 LAB — GLUCOSE, CAPILLARY
Glucose-Capillary: 129 mg/dL — ABNORMAL HIGH (ref 70–99)
Glucose-Capillary: 231 mg/dL — ABNORMAL HIGH (ref 70–99)
Glucose-Capillary: 149 mg/dL — ABNORMAL HIGH (ref 70–99)
Glucose-Capillary: 105 mg/dL — ABNORMAL HIGH (ref 70–99)
Glucose-Capillary: 164 mg/dL — ABNORMAL HIGH (ref 70–99)

## 2022-12-14 LAB — BASIC METABOLIC PANEL
Anion gap: 6 (ref 5–15)
BUN: 28 mg/dL — ABNORMAL HIGH (ref 8–23)
CO2: 22 mmol/L (ref 22–32)
Calcium: 8.4 mg/dL — ABNORMAL LOW (ref 8.9–10.3)
Chloride: 102 mmol/L (ref 98–111)
Creatinine, Ser: 1.1 mg/dL (ref 0.61–1.24)
GFR, Estimated: >60 mL/min (ref >60)
Glucose, Bld: 155 mg/dL — ABNORMAL HIGH (ref 70–99)
Potassium: 4 mmol/L (ref 3.5–5.1)
Sodium: 130 mmol/L — ABNORMAL LOW (ref 135–145)

## 2022-12-14 LAB — MAGNESIUM: Magnesium: 2.1 mg/dL (ref 1.7–2.4)

## 2022-12-14 SURGERY — CARDIOVERSION (CATH LAB)
Anesthesia: General

## 2022-12-14 MED ORDER — AMIODARONE HCL IN DEXTROSE 360-4.14 MG/200ML-% IV SOLN
30.0000 mg/h | INTRAVENOUS | Status: DC
  Administered 2022-12-14 – 2022-12-15 (×3): 30 mg/h via INTRAVENOUS
  Filled 2022-12-14 (×3): qty 200

## 2022-12-14 MED ORDER — PROPOFOL 10 MG/ML IV BOLUS
INTRAVENOUS | Status: DC | PRN
  Administered 2022-12-14: 40 mg via INTRAVENOUS

## 2022-12-14 MED ORDER — SODIUM CHLORIDE 0.9% FLUSH
10.0000 mL | Freq: Two times a day (BID) | INTRAVENOUS | Status: DC
Start: 2022-12-14 — End: 2022-12-14

## 2022-12-14 SURGICAL SUPPLY — 1 items: PAD DEFIB RADIO PHYSIO CONN (PAD) ×2 IMPLANT

## 2022-12-14 NOTE — Interval H&P Note (Signed)
History and Physical Interval Note:  12/14/2022 2:57 PM  Garrett Roberts  has presented today for surgery, with the diagnosis of afib.  The various methods of treatment have been discussed with the patient and family. After consideration of risks, benefits and other options for treatment, the patient has consented to  Procedure(s): CARDIOVERSION (N/A) as a surgical intervention.  The patient's history has been reviewed, patient examined, no change in status, stable for surgery.  I have reviewed the patient's chart and labs.  Questions were answered to the patient's satisfaction.     Little Ishikawa

## 2022-12-14 NOTE — Progress Notes (Signed)
CARDIAC REHAB PHASE I    Pt sitting in chair feeling well. Scheduled for DCCV today. Reviewed MI/stent education provided yesterday. All questions and concerns addressed. HF education provided via HF nurse navigator. Will continue to follow.     [id number] Woodroe Chen, RN BSN 12/14/2022 12:27 PM

## 2022-12-14 NOTE — Transfer of Care (Signed)
Immediate Anesthesia Transfer of Care Note  Patient: Garrett Roberts  Procedure(s) Performed: CARDIOVERSION  Patient Location: PACU  Anesthesia Type:General  Level of Consciousness: drowsy  Airway & Oxygen Therapy: Patient Spontanous Breathing and Patient connected to nasal cannula oxygen  Post-op Assessment: Report given to RN and Post -op Vital signs reviewed and stable  Post vital signs: Reviewed and stable  Last Vitals:  Vitals Value Taken Time  BP 95/67 1513  Temp    Pulse 74 1513  Resp 22 1513  SpO2 97% 1513    Last Pain:  Vitals:   12/14/22 1442  TempSrc:   PainSc: 0-No pain      Patients Stated Pain Goal: 0 (12/13/22 1600)  Complications: No notable events documented.

## 2022-12-14 NOTE — Progress Notes (Addendum)
Informed consent discussed per Dr. Hazle Coca request. Cath lab RN discussed case with anesthesia and they are able to do DCCV 3pm today per Dr. Hazle Coca request. Has been made NPO except sips with meds going forward today. Patient already got long acting insulin today but had eaten breakfast with CBG 231 this AM, so hold off additional adjustment - CBG being monitored, last 149.  Informed Consent   Shared Decision Making/Informed Consent The risks (stroke, cardiac arrhythmias rarely resulting in the need for a temporary or permanent pacemaker, skin irritation or burns and complications associated with conscious sedation including aspiration, arrhythmia, respiratory failure and death), benefits (restoration of normal sinus rhythm) and alternatives of a direct current cardioversion were explained in detail to Garrett Roberts and he agrees to proceed.

## 2022-12-14 NOTE — Care Management (Signed)
MATCH MEDICATION ASSISTANCE CARD Pharmacies please call: [phone] for claim processing assistance.  Rx BIN: [id number] Rx Group: [id number] Rx PCN: [id number] Relationship Code: 1 Person Code: 01  Patient ID (MRN): [id number]   Patient Name: Garrett Roberts    Patient DOB: 1955-05-26    Discharge Date:12/14/2022    Expiration Date:1212/2024 (must be filled within 7 days of discharge)    MATCH information sent to [facility] pharmacy group in secure chat

## 2022-12-14 NOTE — Plan of Care (Signed)
  Problem: Clinical Measurements: Goal: Respiratory complications will improve Outcome: Progressing Goal: Cardiovascular complication will be avoided Outcome: Progressing   Problem: Education: Goal: Knowledge of General Education information will improve Description: Including pain rating scale, medication(s)/side effects and non-pharmacologic comfort measures Outcome: Progressing   Problem: Nutrition: Goal: Adequate nutrition will be maintained Outcome: Progressing

## 2022-12-14 NOTE — Inpatient Diabetes Management (Addendum)
Inpatient Diabetes Program Recommendations  AACE/ADA: New Consensus Statement on Inpatient Glycemic Control (2015)  Target Ranges:  Prepandial:   less than 140 mg/dL      Peak postprandial:   less than 180 mg/dL (1-2 hours)      Critically ill patients:  140 - 180 mg/dL   Lab Results  Component Value Date   GLUCAP 231 (H) 12/14/2022   HGBA1C 11.2 (H) 12/10/2022    Review of Glycemic Control  Latest Reference Range & Units 12/13/22 16:27 12/13/22 23:18 12/14/22 06:46 12/14/22 07:59  Glucose-Capillary 70 - 99 mg/dL 098 (H) 119 (H) 147 (H) 231 (H)  (H): Data is abnormally high Diabetes history: DM 2 Outpatient Diabetes medications:  None (per patient) Current orders for Inpatient glycemic control:  Novolog moderate tid with meals and HS Jardiance 25 mg daily Levemir 5 units bid  Inpatient Diabetes Program Recommendations:    Could consider changing regimen to Novolog 70/30 6 units BID. Will see patient today.   Addendum @1215 - Noted plan for cath today. Can make changes to insulin when appropriate.   Spoke with patient at length regarding outpatient diabetes. Patient refuses use of insulin outpatient.  Reviewed patient's current A1c of 11.2%. Explained what a A1c is and what it measures. Also reviewed goal A1c with patient, importance of good glucose control @ home, and blood sugar goals. Reviewed patho of DM, role of pancreas, beta cell function, role of insulin, differences between oral medications and insulin, 70/30, Relion products, survival skills, interventions, vascular changes, impact from cardiac perspective, and other commorbidities.  Patient has a meter and testing supplies that he rarely uses. Encouraged to begin checking and report to PCP if consistently outside of target goals. Reviewed current inpatient glucose trends.  Patient understands and remains focused on using solely oral agents to manage diabetes.  Denies consumption of sugary beverages. Encouraged protein  and CHO mindfulness.  No further questions at this time.   Thanks, Lujean Rave, MSN, RNC-OB Diabetes Coordinator [phone] (8a-5p)

## 2022-12-14 NOTE — CV Procedure (Signed)
Procedure:   DCCV  Indication:  Symptomatic atrial fibrillation  Procedure Note:  The patient signed informed consent.  They went into Afib on 12/2 and were started on anticoagulation at that time with heparin gtt, converted to Eliquis on 12/3.  Anesthesia was administered by Dr. Maple Hudson.  Adequate airway was maintained throughout and vital followed per protocol.  They were cardioverted x 1 with 200J of biphasic synchronized energy.  They converted to NSR with rate 70s.  There were no apparent complications.  The patient had normal neuro status and respiratory status post procedure with vitals stable as recorded elsewhere.    Follow up:  They will continue on current medical therapy and follow up with cardiology as scheduled.  Epifanio Lesches, MD 12/14/2022 3:13 PM

## 2022-12-14 NOTE — Progress Notes (Signed)
Rounding Note    Patient Name: Garrett Roberts Date of Encounter: 12/14/2022  [facility] Cardiologist: Dr. Jeanella Cara  Subjective   Postop day 4 anterior STEMI treated with PCI and drug-eluting stenting by Dr. Excell Seltzer.  Patient currently pain-free.  Inpatient Medications    Scheduled Meds:  apixaban  5 mg Oral BID   aspirin  81 mg Oral Daily   atorvastatin  80 mg Oral QHS   Chlorhexidine Gluconate Cloth  6 each Topical Daily   clopidogrel  75 mg Oral Daily   empagliflozin  25 mg Oral QAC breakfast   ezetimibe  10 mg Oral Daily   insulin aspart  0-15 Units Subcutaneous TID WC   insulin aspart  0-5 Units Subcutaneous QHS   insulin detemir  5 Units Subcutaneous BID   melatonin  5 mg Oral QHS   metoprolol succinate  12.5 mg Oral QHS   pantoprazole  40 mg Oral BID   polyethylene glycol  17 g Oral BID   senna  2 tablet Oral Daily   sodium chloride flush  3 mL Intravenous Q12H   Continuous Infusions:  amiodarone      PRN Meds: acetaminophen, ALPRAZolam, nitroGLYCERIN, ondansetron (ZOFRAN) IV, sodium chloride flush   Vital Signs    Vitals:   12/14/22 0500 12/14/22 0700 12/14/22 0800 12/14/22 0900  BP: 106/75 104/75 90/76   Pulse: (!) 104 (!) 102 (!) 105   Resp: (!) 0 (!) 21 (!) 25   Temp:    98.2 F (36.8 C)  TempSrc:    Oral  SpO2: 98% 97% 95%   Weight: 78.6 kg     Height:        Intake/Output Summary (Last 24 hours) at 12/14/2022 0916 Last data filed at 12/14/2022 0200 Gross per 24 hour  Intake 129 ml  Output 550 ml  Net -421 ml      12/14/2022    5:00 AM 12/13/2022    6:00 AM 12/12/2022    6:15 AM  Last 3 Weights  Weight (lbs) 173 lb 4.5 oz 172 lb 1.6 oz 176 lb 9.4 oz  Weight (kg) 78.6 kg 78.064 kg 80.1 kg      Telemetry    A-fib with RVR- Personally Reviewed  ECG    Not performed today-personally Reviewed  Physical Exam   GEN: No acute distress.   Neck: No JVD Cardiac: Irregularly irregular, no murmurs, rubs, or gallops.   Respiratory: Clear to auscultation bilaterally. GI: Soft, nontender, non-distended  MS: No edema; No deformity. Neuro:  Nonfocal  Psych: Normal affect   Labs    High Sensitivity Troponin:   Recent Labs  Lab 12/10/22 1545 12/10/22 1837  TROPONINIHS 137* >24,000*     Chemistry Recent Labs  Lab 12/10/22 1545 12/11/22 0301 12/13/22 0015  NA 130* 133* 132*  K 3.8 4.3 4.6  CL 97* 97* 100  CO2 23 20* 19*  GLUCOSE 463* 340* 111*  BUN 16 15 25*  CREATININE 1.14 1.17 1.19  CALCIUM 9.2 9.5 9.2  MG  --   --  2.1  PROT  --   --  7.2  ALBUMIN  --   --  3.2*  AST  --   --  96*  ALT  --   --  61*  ALKPHOS  --   --  58  BILITOT  --   --  1.0  GFRNONAA >60 >60 >60  ANIONGAP 10 16* 13    Lipids  Recent Labs  Lab 12/10/22 1648  CHOL 212*  TRIG 113  HDL 43  LDLCALC 146*  CHOLHDL 4.9    Hematology Recent Labs  Lab 12/10/22 1545 12/11/22 0301  WBC 8.4 15.4*  RBC 5.74 5.91*  HGB 16.5 17.2*  HCT 50.6 51.8  MCV 88.2 87.6  MCH 28.7 29.1  MCHC 32.6 33.2  RDW 11.9 12.2  PLT 259 275   Thyroid No results for input(s): "TSH", "FREET4" in the last 168 hours.  BNPNo results for input(s): "BNP", "PROBNP" in the last 168 hours.  DDimer No results for input(s): "DDIMER" in the last 168 hours.   Radiology    No results found.  Cardiac Studies   2D echo (12/11/2022)  IMPRESSIONS     1. Mid septum to apex is akinetic consistent with LAD infarction. No LV  apical thrombus on contrast imaging. Left ventricular ejection fraction,  by estimation, is 20 to 25%. The left ventricle has severely decreased  function. The left ventricle  demonstrates regional wall motion abnormalities (see scoring  diagram/findings for description). There is moderate concentric left  ventricular hypertrophy. Left ventricular diastolic parameters are  consistent with Grade II diastolic dysfunction  (pseudonormalization).   2. Right ventricular systolic function is moderately reduced. The right   ventricular size is normal.   3. The mitral valve is grossly normal. Mild mitral valve regurgitation.  No evidence of mitral stenosis.   4. The aortic valve is tricuspid. Aortic valve regurgitation is trivial.  No aortic stenosis is present.   5. Aortic dilatation noted. There is mild dilatation of the ascending  aorta, measuring 42 mm.   6. The inferior vena cava is dilated in size with >50% respiratory  variability, suggesting right atrial pressure of 8 mmHg.   Cardiac catheterization/PCI and stent (12/10/2022)  Conclusion      2nd Diag lesion is 100% stenosed.   1.  Acute anterior STEMI, secondary to total occlusion of the proximal LAD.  Primary PCI performed with a 3.5 x 38 mm Synergy DES.  Baseline stenosis 100% with TIMI 0 flow.  Post-PCI stenosis 0% with TIMI-3 flow.  The second diagonal branch is totally occluded both pre and post PCI.  This branch was previously shown to be severely diseased. 2.  Continued patency of the mid RCA stent with moderate distal RCA stenosis of 60 to 70% 3.  Severe stenosis of a tortuous ramus intermedius branch, unchanged from the previous procedure 4.  Severe diffuse distal circumflex stenosis and a diabetic distal diffuse pattern 5.  Moderately elevated LVEDP   Recommendations: Resume aspirin and ticagrelor to be continued without interruption for at least 12 months.  Consider long-term P2 Y12 inhibition in this diabetic patient with extensive coronary artery disease, now presenting with a second STEMI event in a different vessel.  Otherwise routine post MI medical therapy, aggressive risk reduction measures, and check inpatient 2D echocardiogram to assess degree of LV dysfunction.  Comparison(s): The left ventricular function is significantly worse  nostic Dominance: Right  Intervention      Patient Profile     67 y.o. male with  hypertension, hyperlipidemia, DM2, Mobitz 1, and history of CAD who is being seen 12/10/2022 for the evaluation  of chest pain.   Assessment & Plan    1: Anterior STEMI-patient presented with anterior STEMI on 12/10/2022.  He had had an inferior STEMI a year before and apparently was not compliant with his medications.  Dr. Excell Seltzer implanted a long drug-eluting stent in the ostial/proximal LAD with  excellent result.  His RCA stent was patent.  Given his history of noncompliance we will transition him from ticagrelor to clopidogrel along with baby aspirin.  He will need uninterrupted DAPT for at least 12 months if not long-term DAPT.  2: Abdominal pain-seen by gastroenterology because of abdominal pain and prominent fold in his stomach by CT.  Conservative therapy was recommended including twice daily PPI.  His abdominal pain has improved.  3: Essential hypertension-on metoprolol tartrate.  Blood pressure soft.  Was on losartan as an outpatient which we will hold pending improvement in his blood pressure.  4: Diabetes-hemoglobin A1c 11.2.  On Jardiance at home.  On insulin as well.  Appreciate diabetes coordinator input.  5: Dyslipidemia-lipid profile on admission revealed total cholesterol 212 and LDL of 146.  He was noncompliant with his statin at home.  Currently on atorvastatin 80 and Zetia.  LDL goal in the 50-60 range.  6: Ischemic cardiomyopathy-EF by echo in the 25% range.  This is reduced from his prior EF in the 45% range after his inferior STEMI.  Blood pressure too low for GDMT.  On Jardiance.  Will continue beta-blocker (metoprolol succinate) and consider adding his ARB back once his hemodynamics stabilize.  7: A-fib with RVR-initially his A-fib which is new with a heart rate of 145 resulting in hypotension.  He is currently on amiodarone and Eliquis (third dose given this morning).  His heart rate is down into the low 100 range.  Given his ongoing A-fib despite being on amiodarone 200 mg p.o. twice daily and Eliquis his blood pressure remains in the 80 range.  I favor DC cardioversion for  restoration of sinus rhythm which hopefully may improve his blood pressure.  We can do this without transesophageal echocardiography given that the A-fib was newly recognized and immediately anticoagulated.  We will keep him n.p.o. Postop day 3 anterior STEMI.  Currently in A-fib.  Blood pressure soft.  Plan transfer to telemetry, medication assistance form affordability.  Cardiac rehab/ambulation.  Anticipate discharge tomorrow if he remains clinically stable.   For questions or updates, please contact [facility] Please consult [email or web address] for contact info under        Signed, Nanetta Batty, MD  12/14/2022, 9:16 AM

## 2022-12-14 NOTE — H&P (View-Only) (Signed)
Rounding Note    Patient Name: Garrett Roberts Date of Encounter: 12/14/2022  [facility] Cardiologist: Dr. Jeanella Cara  Subjective   Postop day 4 anterior STEMI treated with PCI and drug-eluting stenting by Dr. Excell Seltzer.  Patient currently pain-free.  Inpatient Medications    Scheduled Meds:  apixaban  5 mg Oral BID   aspirin  81 mg Oral Daily   atorvastatin  80 mg Oral QHS   Chlorhexidine Gluconate Cloth  6 each Topical Daily   clopidogrel  75 mg Oral Daily   empagliflozin  25 mg Oral QAC breakfast   ezetimibe  10 mg Oral Daily   insulin aspart  0-15 Units Subcutaneous TID WC   insulin aspart  0-5 Units Subcutaneous QHS   insulin detemir  5 Units Subcutaneous BID   melatonin  5 mg Oral QHS   metoprolol succinate  12.5 mg Oral QHS   pantoprazole  40 mg Oral BID   polyethylene glycol  17 g Oral BID   senna  2 tablet Oral Daily   sodium chloride flush  3 mL Intravenous Q12H   Continuous Infusions:  amiodarone      PRN Meds: acetaminophen, ALPRAZolam, nitroGLYCERIN, ondansetron (ZOFRAN) IV, sodium chloride flush   Vital Signs    Vitals:   12/14/22 0500 12/14/22 0700 12/14/22 0800 12/14/22 0900  BP: 106/75 104/75 90/76   Pulse: (!) 104 (!) 102 (!) 105   Resp: (!) 0 (!) 21 (!) 25   Temp:    98.2 F (36.8 C)  TempSrc:    Oral  SpO2: 98% 97% 95%   Weight: 78.6 kg     Height:        Intake/Output Summary (Last 24 hours) at 12/14/2022 0916 Last data filed at 12/14/2022 0200 Gross per 24 hour  Intake 129 ml  Output 550 ml  Net -421 ml      12/14/2022    5:00 AM 12/13/2022    6:00 AM 12/12/2022    6:15 AM  Last 3 Weights  Weight (lbs) 173 lb 4.5 oz 172 lb 1.6 oz 176 lb 9.4 oz  Weight (kg) 78.6 kg 78.064 kg 80.1 kg      Telemetry    A-fib with RVR- Personally Reviewed  ECG    Not performed today-personally Reviewed  Physical Exam   GEN: No acute distress.   Neck: No JVD Cardiac: Irregularly irregular, no murmurs, rubs, or gallops.   Respiratory: Clear to auscultation bilaterally. GI: Soft, nontender, non-distended  MS: No edema; No deformity. Neuro:  Nonfocal  Psych: Normal affect   Labs    High Sensitivity Troponin:   Recent Labs  Lab 12/10/22 1545 12/10/22 1837  TROPONINIHS 137* >24,000*     Chemistry Recent Labs  Lab 12/10/22 1545 12/11/22 0301 12/13/22 0015  NA 130* 133* 132*  K 3.8 4.3 4.6  CL 97* 97* 100  CO2 23 20* 19*  GLUCOSE 463* 340* 111*  BUN 16 15 25*  CREATININE 1.14 1.17 1.19  CALCIUM 9.2 9.5 9.2  MG  --   --  2.1  PROT  --   --  7.2  ALBUMIN  --   --  3.2*  AST  --   --  96*  ALT  --   --  61*  ALKPHOS  --   --  58  BILITOT  --   --  1.0  GFRNONAA >60 >60 >60  ANIONGAP 10 16* 13    Lipids  Recent Labs  Lab 12/10/22 1648  CHOL 212*  TRIG 113  HDL 43  LDLCALC 146*  CHOLHDL 4.9    Hematology Recent Labs  Lab 12/10/22 1545 12/11/22 0301  WBC 8.4 15.4*  RBC 5.74 5.91*  HGB 16.5 17.2*  HCT 50.6 51.8  MCV 88.2 87.6  MCH 28.7 29.1  MCHC 32.6 33.2  RDW 11.9 12.2  PLT 259 275   Thyroid No results for input(s): "TSH", "FREET4" in the last 168 hours.  BNPNo results for input(s): "BNP", "PROBNP" in the last 168 hours.  DDimer No results for input(s): "DDIMER" in the last 168 hours.   Radiology    No results found.  Cardiac Studies   2D echo (12/11/2022)  IMPRESSIONS     1. Mid septum to apex is akinetic consistent with LAD infarction. No LV  apical thrombus on contrast imaging. Left ventricular ejection fraction,  by estimation, is 20 to 25%. The left ventricle has severely decreased  function. The left ventricle  demonstrates regional wall motion abnormalities (see scoring  diagram/findings for description). There is moderate concentric left  ventricular hypertrophy. Left ventricular diastolic parameters are  consistent with Grade II diastolic dysfunction  (pseudonormalization).   2. Right ventricular systolic function is moderately reduced. The right   ventricular size is normal.   3. The mitral valve is grossly normal. Mild mitral valve regurgitation.  No evidence of mitral stenosis.   4. The aortic valve is tricuspid. Aortic valve regurgitation is trivial.  No aortic stenosis is present.   5. Aortic dilatation noted. There is mild dilatation of the ascending  aorta, measuring 42 mm.   6. The inferior vena cava is dilated in size with >50% respiratory  variability, suggesting right atrial pressure of 8 mmHg.   Cardiac catheterization/PCI and stent (12/10/2022)  Conclusion      2nd Diag lesion is 100% stenosed.   1.  Acute anterior STEMI, secondary to total occlusion of the proximal LAD.  Primary PCI performed with a 3.5 x 38 mm Synergy DES.  Baseline stenosis 100% with TIMI 0 flow.  Post-PCI stenosis 0% with TIMI-3 flow.  The second diagonal branch is totally occluded both pre and post PCI.  This branch was previously shown to be severely diseased. 2.  Continued patency of the mid RCA stent with moderate distal RCA stenosis of 60 to 70% 3.  Severe stenosis of a tortuous ramus intermedius branch, unchanged from the previous procedure 4.  Severe diffuse distal circumflex stenosis and a diabetic distal diffuse pattern 5.  Moderately elevated LVEDP   Recommendations: Resume aspirin and ticagrelor to be continued without interruption for at least 12 months.  Consider long-term P2 Y12 inhibition in this diabetic patient with extensive coronary artery disease, now presenting with a second STEMI event in a different vessel.  Otherwise routine post MI medical therapy, aggressive risk reduction measures, and check inpatient 2D echocardiogram to assess degree of LV dysfunction.  Comparison(s): The left ventricular function is significantly worse  nostic Dominance: Right  Intervention      Patient Profile     67 y.o. male with  hypertension, hyperlipidemia, DM2, Mobitz 1, and history of CAD who is being seen 12/10/2022 for the evaluation  of chest pain.   Assessment & Plan    1: Anterior STEMI-patient presented with anterior STEMI on 12/10/2022.  He had had an inferior STEMI a year before and apparently was not compliant with his medications.  Dr. Excell Seltzer implanted a long drug-eluting stent in the ostial/proximal LAD with  excellent result.  His RCA stent was patent.  Given his history of noncompliance we will transition him from ticagrelor to clopidogrel along with baby aspirin.  He will need uninterrupted DAPT for at least 12 months if not long-term DAPT.  2: Abdominal pain-seen by gastroenterology because of abdominal pain and prominent fold in his stomach by CT.  Conservative therapy was recommended including twice daily PPI.  His abdominal pain has improved.  3: Essential hypertension-on metoprolol tartrate.  Blood pressure soft.  Was on losartan as an outpatient which we will hold pending improvement in his blood pressure.  4: Diabetes-hemoglobin A1c 11.2.  On Jardiance at home.  On insulin as well.  Appreciate diabetes coordinator input.  5: Dyslipidemia-lipid profile on admission revealed total cholesterol 212 and LDL of 146.  He was noncompliant with his statin at home.  Currently on atorvastatin 80 and Zetia.  LDL goal in the 50-60 range.  6: Ischemic cardiomyopathy-EF by echo in the 25% range.  This is reduced from his prior EF in the 45% range after his inferior STEMI.  Blood pressure too low for GDMT.  On Jardiance.  Will continue beta-blocker (metoprolol succinate) and consider adding his ARB back once his hemodynamics stabilize.  7: A-fib with RVR-initially his A-fib which is new with a heart rate of 145 resulting in hypotension.  He is currently on amiodarone and Eliquis (third dose given this morning).  His heart rate is down into the low 100 range.  Given his ongoing A-fib despite being on amiodarone 200 mg p.o. twice daily and Eliquis his blood pressure remains in the 80 range.  I favor DC cardioversion for  restoration of sinus rhythm which hopefully may improve his blood pressure.  We can do this without transesophageal echocardiography given that the A-fib was newly recognized and immediately anticoagulated.  We will keep him n.p.o. Postop day 3 anterior STEMI.  Currently in A-fib.  Blood pressure soft.  Plan transfer to telemetry, medication assistance form affordability.  Cardiac rehab/ambulation.  Anticipate discharge tomorrow if he remains clinically stable.   For questions or updates, please contact [facility] Please consult [email or web address] for contact info under        Signed, Nanetta Batty, MD  12/14/2022, 9:16 AM

## 2022-12-14 NOTE — Plan of Care (Signed)
  Problem: Education: Goal: Knowledge of General Education information will improve Description: Including pain rating scale, medication(s)/side effects and non-pharmacologic comfort measures Outcome: Progressing   Problem: Health Behavior/Discharge Planning: Goal: Ability to manage health-related needs will improve Outcome: Progressing   Problem: Activity: Goal: Risk for activity intolerance will decrease Outcome: Progressing   Problem: Nutrition: Goal: Adequate nutrition will be maintained Outcome: Progressing   Problem: Coping: Goal: Level of anxiety will decrease Outcome: Progressing   Problem: Pain Management: Goal: General experience of comfort will improve Outcome: Progressing   Problem: Safety: Goal: Ability to remain free from injury will improve Outcome: Progressing

## 2022-12-15 ENCOUNTER — Encounter (HOSPITAL_COMMUNITY): Payer: Self-pay | Admitting: Cardiology

## 2022-12-15 ENCOUNTER — Other Ambulatory Visit (HOSPITAL_COMMUNITY): Payer: Self-pay

## 2022-12-15 ENCOUNTER — Telehealth: Payer: Self-pay

## 2022-12-15 DIAGNOSIS — E118 Type 2 diabetes mellitus with unspecified complications: Secondary | ICD-10-CM

## 2022-12-15 DIAGNOSIS — Z794 Long term (current) use of insulin: Secondary | ICD-10-CM

## 2022-12-15 LAB — BASIC METABOLIC PANEL
Anion gap: 11 (ref 5–15)
BUN: 29 mg/dL — ABNORMAL HIGH (ref 8–23)
CO2: 22 mmol/L (ref 22–32)
Calcium: 8.6 mg/dL — ABNORMAL LOW (ref 8.9–10.3)
Chloride: 100 mmol/L (ref 98–111)
Creatinine, Ser: 1.35 mg/dL — ABNORMAL HIGH (ref 0.61–1.24)
GFR, Estimated: 58 mL/min — ABNORMAL LOW (ref >60)
Glucose, Bld: 158 mg/dL — ABNORMAL HIGH (ref 70–99)
Potassium: 4.6 mmol/L (ref 3.5–5.1)
Sodium: 133 mmol/L — ABNORMAL LOW (ref 135–145)

## 2022-12-15 LAB — MAGNESIUM: Magnesium: 2.2 mg/dL (ref 1.7–2.4)

## 2022-12-15 LAB — H. PYLORI ANTIGEN, STOOL: H. Pylori Stool Ag, Eia: POSITIVE — AB

## 2022-12-15 LAB — GLUCOSE, CAPILLARY
Glucose-Capillary: 181 mg/dL — ABNORMAL HIGH (ref 70–99)
Glucose-Capillary: 163 mg/dL — ABNORMAL HIGH (ref 70–99)

## 2022-12-15 MED ORDER — CLOPIDOGREL BISULFATE 75 MG PO TABS
75.0000 mg | ORAL_TABLET | Freq: Every day | ORAL | 2 refills | Status: DC
  Filled 2022-12-15: qty 30, 30d supply, fill #0

## 2022-12-15 MED ORDER — AMIODARONE HCL 200 MG PO TABS
ORAL_TABLET | ORAL | 0 refills | Status: DC
  Filled 2022-12-15: qty 74, 30d supply, fill #0

## 2022-12-15 MED ORDER — METOPROLOL SUCCINATE ER 25 MG PO TB24
25.0000 mg | ORAL_TABLET | Freq: Every day | ORAL | Status: DC
  Administered 2022-12-15: 25 mg via ORAL
  Filled 2022-12-15: qty 1

## 2022-12-15 MED ORDER — PANTOPRAZOLE SODIUM 40 MG PO TBEC
40.0000 mg | DELAYED_RELEASE_TABLET | Freq: Two times a day (BID) | ORAL | 1 refills | Status: DC
  Filled 2022-12-15: qty 30, 15d supply, fill #0

## 2022-12-15 MED ORDER — ATORVASTATIN CALCIUM 80 MG PO TABS
80.0000 mg | ORAL_TABLET | Freq: Every day | ORAL | 1 refills | Status: DC
  Filled 2022-12-15: qty 30, 30d supply, fill #0

## 2022-12-15 MED ORDER — APIXABAN 5 MG PO TABS
5.0000 mg | ORAL_TABLET | Freq: Two times a day (BID) | ORAL | 2 refills | Status: DC
  Filled 2022-12-15: qty 60, 30d supply, fill #0
  Filled 2023-01-06: qty 60, 30d supply, fill #1
  Filled 2023-01-09: qty 6, 3d supply, fill #1

## 2022-12-15 MED ORDER — AMIODARONE HCL 200 MG PO TABS
200.0000 mg | ORAL_TABLET | Freq: Two times a day (BID) | ORAL | Status: DC
  Administered 2022-12-15: 200 mg via ORAL
  Filled 2022-12-15: qty 1

## 2022-12-15 MED ORDER — METFORMIN HCL 500 MG PO TABS
500.0000 mg | ORAL_TABLET | Freq: Every day | ORAL | 1 refills | Status: DC
  Filled 2022-12-15: qty 30, 30d supply, fill #0
  Filled 2023-01-06: qty 30, 30d supply, fill #1

## 2022-12-15 MED ORDER — EMPAGLIFLOZIN 25 MG PO TABS
25.0000 mg | ORAL_TABLET | Freq: Every day | ORAL | 1 refills | Status: DC
  Filled 2022-12-15: qty 30, 30d supply, fill #0
  Filled 2023-01-06: qty 30, 30d supply, fill #1
  Filled 2023-01-09: qty 3, 3d supply, fill #1

## 2022-12-15 MED ORDER — NITROGLYCERIN 0.4 MG SL SUBL
0.4000 mg | SUBLINGUAL_TABLET | SUBLINGUAL | 2 refills | Status: DC | PRN
  Filled 2022-12-15: qty 25, 5d supply, fill #0

## 2022-12-15 MED ORDER — ASPIRIN 81 MG PO CHEW
81.0000 mg | CHEWABLE_TABLET | Freq: Every day | ORAL | 0 refills | Status: DC
  Filled 2022-12-15: qty 30, 30d supply, fill #0

## 2022-12-15 MED ORDER — METOPROLOL SUCCINATE ER 25 MG PO TB24
25.0000 mg | ORAL_TABLET | Freq: Every day | ORAL | 1 refills | Status: DC
  Filled 2022-12-15: qty 30, 30d supply, fill #0

## 2022-12-15 MED ORDER — PANTOPRAZOLE SODIUM 40 MG PO TBEC
40.0000 mg | DELAYED_RELEASE_TABLET | Freq: Two times a day (BID) | ORAL | 1 refills | Status: DC
  Filled 2022-12-15: qty 60, 30d supply, fill #0
  Filled 2023-01-06: qty 60, 30d supply, fill #1

## 2022-12-15 MED ORDER — EZETIMIBE 10 MG PO TABS
10.0000 mg | ORAL_TABLET | Freq: Every day | ORAL | 1 refills | Status: DC
  Filled 2022-12-15: qty 30, 30d supply, fill #0

## 2022-12-15 NOTE — Progress Notes (Signed)
Patient discharged as per orders. All questions answered and support given.

## 2022-12-15 NOTE — Discharge Summary (Signed)
Discharge Summary    Patient ID: Garrett Roberts MRN: [id number]; DOB: August 03, 1955  Admit date: 12/10/2022 Discharge date: 12/15/2022  PCP:  Pcp, No   [facility] Providers Cardiologist:  Elder Negus, MD     Discharge Diagnoses    Principal Problem:   Acute ST elevation myocardial infarction (STEMI) of anterior wall [facility]) Active Problems:   Essential hypertension   Hyperlipidemia   ST elevation myocardial infarction involving left anterior descending (LAD) coronary artery (HCC)   Abdominal pain   Abnormal CT scan, stomach   Constipation   Atrial fibrillation (HCC)   Type 2 diabetes mellitus with complication, with long-term current use of insulin [facility])  Diagnostic Studies/Procedures    Cath: 12/10/22    2nd Diag lesion is 100% stenosed.   1.  Acute anterior STEMI, secondary to total occlusion of the proximal LAD.  Primary PCI performed with a 3.5 x 38 mm Synergy DES.  Baseline stenosis 100% with TIMI 0 flow.  Post-PCI stenosis 0% with TIMI-3 flow.  The second diagonal branch is totally occluded both pre and post PCI.  This branch was previously shown to be severely diseased. 2.  Continued patency of the mid RCA stent with moderate distal RCA stenosis of 60 to 70% 3.  Severe stenosis of a tortuous ramus intermedius branch, unchanged from the previous procedure 4.  Severe diffuse distal circumflex stenosis and a diabetic distal diffuse pattern 5.  Moderately elevated LVEDP   Recommendations: Resume aspirin and ticagrelor to be continued without interruption for at least 12 months.  Consider long-term P2 Y12 inhibition in this diabetic patient with extensive coronary artery disease, now presenting with a second STEMI event in a different vessel.  Otherwise routine post MI medical therapy, aggressive risk reduction measures, and check inpatient 2D echocardiogram to assess degree of LV dysfunction.   Diagnostic Dominance: Right  Intervention    Echo:  12/11/2022  IMPRESSIONS     1. Mid septum to apex is akinetic consistent with LAD infarction. No LV  apical thrombus on contrast imaging. Left ventricular ejection fraction,  by estimation, is 20 to 25%. The left ventricle has severely decreased  function. The left ventricle  demonstrates regional wall motion abnormalities (see scoring  diagram/findings for description). There is moderate concentric left  ventricular hypertrophy. Left ventricular diastolic parameters are  consistent with Grade II diastolic dysfunction  (pseudonormalization).   2. Right ventricular systolic function is moderately reduced. The right  ventricular size is normal.   3. The mitral valve is grossly normal. Mild mitral valve regurgitation.  No evidence of mitral stenosis.   4. The aortic valve is tricuspid. Aortic valve regurgitation is trivial.  No aortic stenosis is present.   5. Aortic dilatation noted. There is mild dilatation of the ascending  aorta, measuring 42 mm.   6. The inferior vena cava is dilated in size with >50% respiratory  variability, suggesting right atrial pressure of 8 mmHg.   Comparison(s): The left ventricular function is significantly worse.   Conclusion(s)/Recommendation(s): Findings consistent with ischemic  cardiomyopathy. No left ventricular mural or apical thrombus/thrombi.   FINDINGS   Left Ventricle: Mid septum to apex is akinetic consistent with LAD  infarction. No LV apical thrombus on contrast imaging. Left ventricular  ejection fraction, by estimation, is 20 to 25%. The left ventricle has  severely decreased function. The left  ventricle demonstrates regional wall motion abnormalities. Definity  contrast agent was given IV to delineate the left ventricular endocardial  borders. The  left ventricular internal cavity size was normal in size.  There is moderate concentric left  ventricular hypertrophy. Left ventricular diastolic parameters are  consistent with Grade II  diastolic dysfunction (pseudonormalization).     LV Wall Scoring:  The mid and distal anterior septum, mid inferoseptal segment, apical  anterior  segment, apical inferior segment, and apex are akinetic.   Right Ventricle: The right ventricular size is normal. No increase in  right ventricular wall thickness. Right ventricular systolic function is  moderately reduced.   Left Atrium: Left atrial size was normal in size.   Right Atrium: Right atrial size was normal in size.   Pericardium: There is no evidence of pericardial effusion.   Mitral Valve: The mitral valve is grossly normal. Mild mitral valve  regurgitation. No evidence of mitral valve stenosis.   Tricuspid Valve: The tricuspid valve is grossly normal. Tricuspid valve  regurgitation is trivial. No evidence of tricuspid stenosis.   Aortic Valve: The aortic valve is tricuspid. Aortic valve regurgitation is  trivial. No aortic stenosis is present.   Pulmonic Valve: The pulmonic valve was grossly normal. Pulmonic valve  regurgitation is not visualized. No evidence of pulmonic stenosis.   Aorta: Aortic dilatation noted and the aortic root is normal in size and  structure. There is mild dilatation of the ascending aorta, measuring 42  mm.   Venous: The inferior vena cava is dilated in size with greater than 50%  respiratory variability, suggesting right atrial pressure of 8 mmHg.   IAS/Shunts: The atrial septum is grossly normal.   Additional Comments: There is a small pleural effusion in the left lateral  _____________   History of Present Illness     Garrett Roberts is a 67 y.o. male with past medical history of hypertension, hyperlipidemia, DM2, Mobitz 1, and history of CAD.  Patient had a inferior STEMI on 10/30/2021, cardiac catheterization performed on the same day showed EF 40 to 45% with inferior akinesis, 50% mid LAD lesion, 90% diffusely diseased a very small D1, small to moderate-sized D2 had a 90%  diffuse disease, 30% mid left circumflex lesion, 90% OM1 lesion, 99% ostial OM2 lesion, large RCA with 100% occlusion in the midsegment treated with 2.75 x 32 mm Synergy XD DES.  Postprocedure, patient was placed on aspirin and Brilinta with instruction to continue dual antiplatelet therapy for 1 year.  He was also on losartan and metoprolol for blood pressure control.  Subsequent echocardiogram showed EF 55 to 60% without significant valvular issue.   He was in his usual state of health until 2:30 PM the day of admission when he started having nausea, substernal chest pain and left upper and lower quadrant abdominal pain.  His chest discomfort and abdominal discomfort started together.  He eventually sought medical attention at [facility]. Significant lab work include sodium 130, creatinine 1.14, troponin 137, normal hemoglobin.  Chest x-ray showed increased central interstitial marking bilaterally which may represent bronchitis versus atypical infection, 3 mm left upper lobe lung nodule, no follow-up needed if the patient is low risk.  EKG demonstrated significant ST segment elevation in lead V2 through V4 with ST depression in V5 and V6.  EKG finding concerning for anterior STEMI.  Hospital Course     Consultants: GI   STEMI -- History of inferior STEMI 1 year prior but was apparently not compliant with his medications.  Underwent cardiac catheterization total occlusion of proximal LAD treated with PCI/DES x 1.  Continued patency of mid RCA stent,  severe stenosis of tortuous ramus branch unchanged from prior procedure, severe diffuse distal left circumflex stenosis to be treated medically.  Recommendations to continue DAPT with aspirin/Brilinta for at least 1 year, consider long-term P2 Y12 inhibition given his history of diabetes and extensive CAD.  Seen by cardiac rehab.  Developed atrial fibrillation with RVR which required transition from Brilinta to Plavix. 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 -- GDMT: Metoprolol XL 25 mg daily, Jardiance.  Blood pressures soft, consider adding ARB/ARNi/MRA as an outpatient if blood pressures able to tolerate  Atrial fibrillation with RVR -- New diagnosis this admission, placed on IV amiodarone but rates remained elevated.  -- s/p TEE/DCCV with conversion to sinus rhythm -- Continue amiodarone 200 mg twice daily x 7 days then 200 mg daily -- Eliquis 5 mg twice daily  Hyperlipidemia -- LDL 146 -- Continue atorvastatin 80 mg daily, Zetia -- Will need LFT/FLP in 8 weeks  Diabetes -- Hemoglobin A1c 11.2 -- Continue Jardiance, resume metformin 500 mg daily.  Of note patient refuses use of insulin as an outpatient -- Outpatient follow-up with PCP for further management  Abdominal pain -- Abdominal CT showed thickened stomach folds and focal wall thickening/under distention of the proximal transverse colon -- Evaluated by GI with recommendations to treat conservatively with PPI.  Defer EGD/colonoscopy for further evaluation until later given his need for dual antiplatelet therapy  Hypertension -- Continue metoprolol XL 25 mg daily  Patient was seen by Dr. Gery Pray and deemed stable for discharge home.  Follow-up arranged in the office.  Medication sent to [facility] pharmacy, educated by Pharm.D. prior to discharge.  Did the patient have an acute coronary syndrome (MI, NSTEMI, STEMI, etc) this admission?:  Yes                               AHA/ACC ACS Clinical Performance & Quality Measures: Aspirin prescribed? - Yes ADP Receptor Inhibitor (Plavix/Clopidogrel, Brilinta/Ticagrelor or Effient/Prasugrel) prescribed (includes medically managed patients)? - Yes Beta Blocker prescribed? - Yes High Intensity Statin (Lipitor 40-80mg  or Crestor 20-40mg )  prescribed? - Yes EF assessed during THIS hospitalization? - Yes For EF <40%, was ACEI/ARB prescribed? - No - Reason:  low BP For EF <40%, Aldosterone Antagonist (Spironolactone or Eplerenone) prescribed? - No - Reason:  low BP Cardiac Rehab Phase II ordered (including medically managed patients)? - Yes       The patient will be scheduled for a TOC follow up appointment in 10-14 days.  A message has been sent to the [facility] and Scheduling Pool at the office where the patient should be seen for follow up.  _____________  Discharge Vitals Blood pressure 106/69, pulse 74, temperature 98.2 F (36.8 C), resp. rate 20, height 5\' 9"  (1.753 m), weight 78.7 kg, SpO2 98%.  Filed Weights   12/13/22 0600 12/14/22 0500 12/15/22 0600  Weight: 78.1 kg 78.6 kg 78.7 kg    Labs & Radiologic Studies    CBC No results for input(s): "WBC", "NEUTROABS", "HGB", "HCT", "MCV", "PLT" in the last 72 hours. Basic Metabolic Panel Recent Labs    08/65/78 1109 12/15/22 0310  NA 130* 133*  K 4.0 4.6  CL 102 100  CO2 22 22  GLUCOSE 155* 158*  BUN  28* 29*  CREATININE 1.10 1.35*  CALCIUM 8.4* 8.6*  MG 2.1 2.2   Liver Function Tests Recent Labs    12/13/22 0015  AST 96*  ALT 61*  ALKPHOS 58  BILITOT 1.0  PROT 7.2  ALBUMIN 3.2*   No results for input(s): "LIPASE", "AMYLASE" in the last 72 hours. High Sensitivity Troponin:   Recent Labs  Lab 12/10/22 1545 12/10/22 1837  TROPONINIHS 137* >24,000*    BNP Invalid input(s): "POCBNP" D-Dimer No results for input(s): "DDIMER" in the last 72 hours. Hemoglobin A1C No results for input(s): "HGBA1C" in the last 72 hours. Fasting Lipid Panel No results for input(s): "CHOL", "HDL", "LDLCALC", "TRIG", "CHOLHDL", "LDLDIRECT" in the last 72 hours. Thyroid Function Tests No results for input(s): "TSH", "T4TOTAL", "T3FREE", "THYROIDAB" in the last 72 hours.  Invalid input(s): "FREET3" _____________  EP STUDY  Result Date: 12/14/2022 See surgical  note for result.  ECHOCARDIOGRAM COMPLETE  Result Date: 12/11/2022    ECHOCARDIOGRAM REPORT   Patient Name:   Garrett Roberts Date of Exam: 12/11/2022 Medical Rec #:  [id number]            Height:       69.0 in Accession #:    [id number]           Weight:       177.0 lb Date of Birth:  01/06/1956           BSA:          1.962 m Patient Age:    67 years             BP:           128/81 mmHg Patient Gender: M                    HR:           85 bpm. Exam Location:  Inpatient Procedure: 2D Echo, Cardiac Doppler, Color Doppler and Intracardiac            Opacification Agent Indications:    R07.9* Chest pain, unspecified  History:        Patient has prior history of Echocardiogram examinations, most                 recent 10/31/2021. Acute MI and CAD, Signs/Symptoms:Chest Pain;                 Risk Factors:Hypertension and Dyslipidemia.  Sonographer:    Sheralyn Boatman RDCS Referring Phys: [id number] HAO MENG  Sonographer Comments: Patient is post PCIx2. IMPRESSIONS  1. Mid septum to apex is akinetic consistent with LAD infarction. No LV apical thrombus on contrast imaging. Left ventricular ejection fraction, by estimation, is 20 to 25%. The left ventricle has severely decreased function. The left ventricle demonstrates regional wall motion abnormalities (see scoring diagram/findings for description). There is moderate concentric left ventricular hypertrophy. Left ventricular diastolic parameters are consistent with Grade II diastolic dysfunction (pseudonormalization).  2. Right ventricular systolic function is moderately reduced. The right ventricular size is normal.  3. The mitral valve is grossly normal. Mild mitral valve regurgitation. No evidence of mitral stenosis.  4. The aortic valve is tricuspid. Aortic valve regurgitation is trivial. No aortic stenosis is present.  5. Aortic dilatation noted. There is mild dilatation of the ascending aorta, measuring 42 mm.  6. The inferior vena cava is dilated in size with >50%  respiratory variability, suggesting right atrial pressure of 8 mmHg. Comparison(s): The left ventricular function  is significantly worse. Conclusion(s)/Recommendation(s): Findings consistent with ischemic cardiomyopathy. No left ventricular mural or apical thrombus/thrombi. FINDINGS  Left Ventricle: Mid septum to apex is akinetic consistent with LAD infarction. No LV apical thrombus on contrast imaging. Left ventricular ejection fraction, by estimation, is 20 to 25%. The left ventricle has severely decreased function. The left ventricle demonstrates regional wall motion abnormalities. Definity contrast agent was given IV to delineate the left ventricular endocardial borders. The left ventricular internal cavity size was normal in size. There is moderate concentric left ventricular hypertrophy. Left ventricular diastolic parameters are consistent with Grade II diastolic dysfunction (pseudonormalization).  LV Wall Scoring: The mid and distal anterior septum, mid inferoseptal segment, apical anterior segment, apical inferior segment, and apex are akinetic. Right Ventricle: The right ventricular size is normal. No increase in right ventricular wall thickness. Right ventricular systolic function is moderately reduced. Left Atrium: Left atrial size was normal in size. Right Atrium: Right atrial size was normal in size. Pericardium: There is no evidence of pericardial effusion. Mitral Valve: The mitral valve is grossly normal. Mild mitral valve regurgitation. No evidence of mitral valve stenosis. Tricuspid Valve: The tricuspid valve is grossly normal. Tricuspid valve regurgitation is trivial. No evidence of tricuspid stenosis. Aortic Valve: The aortic valve is tricuspid. Aortic valve regurgitation is trivial. No aortic stenosis is present. Pulmonic Valve: The pulmonic valve was grossly normal. Pulmonic valve regurgitation is not visualized. No evidence of pulmonic stenosis. Aorta: Aortic dilatation noted and the aortic  root is normal in size and structure. There is mild dilatation of the ascending aorta, measuring 42 mm. Venous: The inferior vena cava is dilated in size with greater than 50% respiratory variability, suggesting right atrial pressure of 8 mmHg. IAS/Shunts: The atrial septum is grossly normal. Additional Comments: There is a small pleural effusion in the left lateral region.  LEFT VENTRICLE PLAX 2D LVIDd:         4.70 cm      Diastology LVIDs:         4.50 cm      LV e' medial:    3.26 cm/s LV PW:         1.30 cm      LV E/e' medial:  26.2 LV IVS:        1.40 cm      LV e' lateral:   9.46 cm/s LVOT diam:     2.20 cm      LV E/e' lateral: 9.0 LV SV:         68 LV SV Index:   35 LVOT Area:     3.80 cm  LV Volumes (MOD) LV vol d, MOD A2C: 128.0 ml LV vol d, MOD A4C: 88.5 ml LV vol s, MOD A2C: 108.0 ml LV vol s, MOD A4C: 62.9 ml LV SV MOD A2C:     20.0 ml LV SV MOD A4C:     88.5 ml LV SV MOD BP:      24.9 ml RIGHT VENTRICLE             IVC RV S prime:     12.40 cm/s  IVC diam: 2.20 cm TAPSE (M-mode): 1.6 cm LEFT ATRIUM             Index        RIGHT ATRIUM          Index LA diam:        3.10 cm 1.58 cm/m   RA Area:     8.47  cm LA Vol (A2C):   56.4 ml 28.75 ml/m  RA Volume:   15.90 ml 8.11 ml/m LA Vol (A4C):   28.9 ml 14.73 ml/m LA Biplane Vol: 42.4 ml 21.61 ml/m  AORTIC VALVE LVOT Vmax:   111.00 cm/s LVOT Vmean:  69.800 cm/s LVOT VTI:    0.179 m  AORTA Ao Root diam: 3.60 cm Ao Asc diam:  4.10 cm MITRAL VALVE MV Area (PHT): 4.97 cm    SHUNTS MV Decel Time: 153 msec    Systemic VTI:  0.18 m MV E velocity: 85.47 cm/s  Systemic Diam: 2.20 cm MV A velocity: 83.07 cm/s MV E/A ratio:  1.03 Lennie Odor MD Electronically signed by Lennie Odor MD Signature Date/Time: 12/11/2022/12:03:26 PM    Final    CT ABDOMEN PELVIS W CONTRAST  Result Date: 12/10/2022 CLINICAL DATA:  Left upper and lower quadrant abdominal pain. EXAM: CT ABDOMEN AND PELVIS WITH CONTRAST TECHNIQUE: Multidetector CT imaging of the abdomen and  pelvis was performed using the standard protocol following bolus administration of intravenous contrast. RADIATION DOSE REDUCTION: This exam was performed according to the departmental dose-optimization program which includes automated exposure control, adjustment of the mA and/or kV according to patient size and/or use of iterative reconstruction technique. CONTRAST:  75mL OMNIPAQUE IOHEXOL 350 MG/ML SOLN COMPARISON:  CT abdomen pelvis no contrast 05/11/2009. FINDINGS: Lower chest: The heart is slightly enlarged. There is calcification in the right coronary artery, small pericardial effusion. There are trace pleural effusions. Interlobular septal thickening is noted in both lung bases consistent with interstitial edema. Findings likely indicating mild CHF or fluid overload. There are scattered ground-glass opacities which are probably ground-glass edema, less likely ground-glass pneumonitis. Hepatobiliary: No focal liver abnormality is seen. No gallstones, gallbladder wall thickening, or biliary dilatation. Pancreas: No abnormality. Spleen: No abnormality. Adrenals/Urinary Tract: There is no adrenal mass. There is contrast in both renal collecting systems which would obscure intrarenal stones if present. There is no hydronephrosis, hydroureter or ureteral filling defect. There is symmetric contrast excretion on the delayed images. The bladder is filled with contrast. The bladder wall is unremarkable. Stomach/Bowel: There are moderate thickened folds in the lateral proximal to mid stomach consistent with gastritis. This has increased since 2011. Endoscopy may be indicated but no overt masslike wall thickening is seen. The unopacified small bowel is unremarkable. The appendix is normal caliber. There is a tiny stone in the proximal appendix. There is moderate retained stool in the ascending colon. There is either a focal wall thickening or underdistention of the proximal transverse colon on 3: 34-38. Rest of the  large bowel wall is largely contracted but otherwise unremarkable. Consider either a short interval follow-up study to see if this persists, BE, or colonoscopy follow-up unless recently done. Vascular/Lymphatic: There is moderate aortoiliac mixed plaque without AAA. No adenopathy is seen. Reproductive: Prostate is unremarkable. Other: No abdominal wall hernia or abnormality. No abdominopelvic ascites. Musculoskeletal: Mild degenerative change lumbar spine. Bridging osteophytes anterior right SI joint. Bone island left femoral head. No acute or other significant osseous findings. IMPRESSION: 1. Moderate thickened folds in the lateral proximal to mid stomach consistent with gastritis. This has increased since 2011. Endoscopy may be indicated but no overt masslike wall thickening is seen. 2. Focal wall thickening or underdistention of the proximal transverse colon. Consider either a short interval follow-up study to see if this persists, BE, or colonoscopy follow-up unless recently done. 3. Cardiomegaly with trace pleural effusions and interstitial edema in the lung bases. 4.  Scattered ground-glass opacities in the lung bases probably ground-glass edema, less likely ground-glass pneumonitis. 5. Aortic and coronary artery atherosclerosis. 6. Moderate retained stool in the ascending colon. Aortic Atherosclerosis (ICD10-I70.0). Electronically Signed   By: Almira Bar M.D.   On: 12/10/2022 23:09   CARDIAC CATHETERIZATION  Result Date: 12/10/2022   2nd Diag lesion is 100% stenosed. 1.  Acute anterior STEMI, secondary to total occlusion of the proximal LAD.  Primary PCI performed with a 3.5 x 38 mm Synergy DES.  Baseline stenosis 100% with TIMI 0 flow.  Post-PCI stenosis 0% with TIMI-3 flow.  The second diagonal branch is totally occluded both pre and post PCI.  This branch was previously shown to be severely diseased. 2.  Continued patency of the mid RCA stent with moderate distal RCA stenosis of 60 to 70% 3.   Severe stenosis of a tortuous ramus intermedius branch, unchanged from the previous procedure 4.  Severe diffuse distal circumflex stenosis and a diabetic distal diffuse pattern 5.  Moderately elevated LVEDP Recommendations: Resume aspirin and ticagrelor to be continued without interruption for at least 12 months.  Consider long-term P2 Y12 inhibition in this diabetic patient with extensive coronary artery disease, now presenting with a second STEMI event in a different vessel.  Otherwise routine post MI medical therapy, aggressive risk reduction measures, and check inpatient 2D echocardiogram to assess degree of LV dysfunction.   DG Chest 2 View  Result Date: 12/10/2022 CLINICAL DATA:  Chest pain and shortness of breath EXAM: CHEST - 2 VIEW COMPARISON:  Chest x-ray 11/30/2021 FINDINGS: There are increased central interstitial markings bilaterally. There is a left upper lobe nodule measuring 3 mm, indeterminate. There is no lung consolidation, pleural effusion or pneumothorax. The cardiomediastinal silhouette is within normal limits. No acute fractures are seen. IMPRESSION: 1. Increased central interstitial markings bilaterally may represent bronchitis or atypical infection. 2. Left upper lobe nodule measuring 3 mm, indeterminate. No follow-up needed if patient is low-risk. Electronically Signed   By: Darliss Cheney M.D.   On: 12/10/2022 16:24   Disposition   Pt is being discharged home today in good condition.  Follow-up Plans & Appointments     Follow-up Information     [facility]. Go in 14 day(s).   Specialty: Cardiology Why: Hospital follow up 12/27/2022 @ 2 pm PLEASE bring a current medication l,ist to appointment [facility] valet parking, Entrance [address], off [employer] information: [address] [place] [address] [phone]               Discharge Instructions     (HEART FAILURE PATIENTS) Call MD:  Anytime  you have any of the following symptoms: 1) 3 pound weight gain in 24 hours or 5 pounds in 1 week 2) shortness of breath, with or without a dry hacking cough 3) swelling in the hands, feet or stomach 4) if you have to sleep on extra pillows at night in order to breathe.   Complete by: As directed    Amb Referral to Cardiac Rehabilitation   Complete by: As directed    Diagnosis:  Coronary Stents STEMI     After initial evaluation and assessments completed: Virtual Based Care may be provided alone or in conjunction with Phase 2 Cardiac Rehab based on patient barriers.: Yes   Intensive Cardiac Rehabilitation (ICR) [facility] location only OR Traditional Cardiac Rehabilitation (TCR) *If criteria for ICR are not met will enroll in TCR [facility] only): Yes  Call MD for:  difficulty breathing, headache or visual disturbances   Complete by: As directed    Call MD for:  persistant dizziness or light-headedness   Complete by: As directed    Call MD for:  redness, tenderness, or signs of infection (pain, swelling, redness, odor or green/yellow discharge around incision site)   Complete by: As directed    Diet - low sodium heart healthy   Complete by: As directed    Discharge instructions   Complete by: As directed    Radial Site Care Refer to this sheet in the next few weeks. These instructions provide you with information on caring for yourself after your procedure. Your caregiver may also give you more specific instructions. Your treatment has been planned according to current medical practices, but problems sometimes occur. Call your caregiver if you have any problems or questions after your procedure. HOME CARE INSTRUCTIONS You may shower the day after the procedure. Remove the bandage (dressing) and gently wash the site with plain soap and water. Gently pat the site dry.  Do not apply powder or lotion to the site.  Do not submerge the affected site in water for 3 to 5 days.  Inspect the site at least twice  daily.  Do not flex or bend the affected arm for 24 hours.  No lifting over 5 pounds (2.3 kg) for 5 days after your procedure.  Do not drive home if you are discharged the same day of the procedure. Have someone else drive you.  You may drive 24 hours after the procedure unless otherwise instructed by your caregiver.  What to expect: Any bruising will usually fade within 1 to 2 weeks.  Blood that collects in the tissue (hematoma) may be painful to the touch. It should usually decrease in size and tenderness within 1 to 2 weeks.  SEEK IMMEDIATE MEDICAL CARE IF: You have unusual pain at the radial site.  You have redness, warmth, swelling, or pain at the radial site.  You have drainage (other than a small amount of blood on the dressing).  You have chills.  You have a fever or persistent symptoms for more than 72 hours.  You have a fever and your symptoms suddenly get worse.  Your arm becomes pale, cool, tingly, or numb.  You have heavy bleeding from the site. Hold pressure on the site.   PLEASE DO NOT MISS ANY DOSES OF YOUR PLAVIX!!!!! Also keep a log of you blood pressures and bring back to your follow up appt. Please call the office with any questions.   Patients taking blood thinners should generally stay away from medicines like ibuprofen, Advil, Motrin, naproxen, and Aleve due to risk of stomach bleeding. You may take Tylenol as directed or talk to your primary doctor about alternatives.   PLEASE ENSURE THAT YOU DO NOT RUN OUT OF YOUR PLAVIX. This medication is very important to remain on for at least one year. IF you have issues obtaining this medication due to cost please CALL the office 3-5 business days prior to running out in order to prevent missing doses of this medication.   Increase activity slowly   Complete by: As directed         Discharge Medications   Allergies as of 12/15/2022       Reactions   Beef-derived Drug Products Other (See Comments)   Religious reasons    Pork-derived Products    Religious reasons        Medication  List     STOP taking these medications    losartan 50 MG tablet Commonly known as: COZAAR   metoprolol tartrate 25 MG tablet Commonly known as: LOPRESSOR   ticagrelor 90 MG Tabs tablet Commonly known as: BRILINTA       TAKE these medications    amiodarone 200 MG tablet Commonly known as: PACERONE Take 1 tablet (200 mg total) by mouth 2 (two) times daily for 7 days, THEN 1 tablet (200 mg total) daily. Start taking on: December 15, 2022   apixaban 5 MG Tabs tablet Commonly known as: ELIQUIS Take 1 tablet (5 mg total) by mouth 2 (two) times daily.   aspirin 81 MG chewable tablet Chew 1 tablet (81 mg total) by mouth daily.   atorvastatin 80 MG tablet Commonly known as: LIPITOR Take 1 tablet (80 mg total) by mouth at bedtime.   B-12 PO Take 1 tablet by mouth daily.   clopidogrel 75 MG tablet Commonly known as: PLAVIX Take 1 tablet (75 mg total) by mouth daily. Start taking on: December 16, 2022   empagliflozin 25 MG Tabs tablet Commonly known as: Jardiance Take 1 tablet (25 mg total) by mouth daily before breakfast.   ezetimibe 10 MG tablet Commonly known as: ZETIA Take 1 tablet (10 mg total) by mouth daily. Start taking on: December 16, 2022   K2 PO Take 1 tablet by mouth daily.   MAGNESIUM PO Take 1 tablet by mouth daily.   metFORMIN 500 MG tablet Commonly known as: GLUCOPHAGE Take 1 tablet (500 mg total) by mouth daily with breakfast. 1000 mg every 2-3 days What changed:  medication strength how much to take when to take this   metoprolol succinate 25 MG 24 hr tablet Commonly known as: TOPROL-XL Take 1 tablet (25 mg total) by mouth daily. Start taking on: December 16, 2022   MULTIVITAMIN PO Take 1 tablet by mouth daily.   nitroGLYCERIN 0.4 MG SL tablet Commonly known as: NITROSTAT Place 1 tablet (0.4 mg total) under the tongue every 5 (five) minutes x 3 doses as needed for chest  pain.   pantoprazole 40 MG tablet Commonly known as: PROTONIX Take 1 tablet (40 mg total) by mouth 2 (two) times daily.   VITAMIN C PO Take 1 tablet by mouth daily.   VITAMIN D PO Take 1 tablet by mouth daily.   ZINC PO Take 1 tablet by mouth daily.           Outstanding Labs/Studies   FLP/LFTs in 8 weeks BMET at follow up  Duration of Discharge Encounter   Greater than 30 minutes including physician time.  Signed, Laverda Page, NP 12/15/2022, 2:07 PM

## 2022-12-15 NOTE — Progress Notes (Addendum)
Pt. converted into A-fib from NSR at 0430 post DCCV done 12/14/22. Pt. Is rate controlled and asymptomatic. Cardiologist on call notified. Will continue to monitor

## 2022-12-15 NOTE — Progress Notes (Signed)
CARDIAC REHAB PHASE I   Pt resting in bed, feeling well today. Offered walk in hallway, pt declined for now. States he is going home today and will walk at home. Pt expressed concerns with sleeping and waking with SOB. Will notify DR Allyson Sabal of pt concerns. Reviewed MI/stent education provided. All questions and concerns addressed. Referral for CRP2 sent to [facility].   [id number] Woodroe Chen, RN BSN 12/15/2022 10:16 AM

## 2022-12-15 NOTE — Progress Notes (Signed)
Rounding Note    Patient Name: Garrett Roberts Date of Encounter: 12/15/2022  [facility] Cardiologist: Dr. Jeanella Cara  Subjective   Postop day 5 anterior STEMI treated with PCI and drug-eluting stenting by Dr. Excell Seltzer.  Patient currently pain-free.  Status post successful DC cardioversion to sinus rhythm with 1 shock yesterday made attaining sinus rhythm with improved blood pressure.  His only complaint is some shortness of breath when sleeping flat although when ambulating he does not complain of any dyspnea on exertion.  Inpatient Medications    Scheduled Meds:  amiodarone  200 mg Oral BID   apixaban  5 mg Oral BID   aspirin  81 mg Oral Daily   atorvastatin  80 mg Oral QHS   Chlorhexidine Gluconate Cloth  6 each Topical Daily   clopidogrel  75 mg Oral Daily   empagliflozin  25 mg Oral QAC breakfast   ezetimibe  10 mg Oral Daily   insulin aspart  0-15 Units Subcutaneous TID WC   insulin aspart  0-5 Units Subcutaneous QHS   insulin detemir  5 Units Subcutaneous BID   melatonin  5 mg Oral QHS   metoprolol succinate  25 mg Oral Daily   pantoprazole  40 mg Oral BID   polyethylene glycol  17 g Oral BID   senna  2 tablet Oral Daily   sodium chloride flush  3 mL Intravenous Q12H   Continuous Infusions:    PRN Meds: acetaminophen, ALPRAZolam, nitroGLYCERIN, ondansetron (ZOFRAN) IV, sodium chloride flush   Vital Signs    Vitals:   12/15/22 0520 12/15/22 0525 12/15/22 0600 12/15/22 0801  BP:   115/78   Pulse: 88 92 89   Resp: 13 (!) 8 (!) 23   Temp:    98.2 F (36.8 C)  TempSrc:    Oral  SpO2: 97% 100% 97%   Weight:   78.7 kg   Height:        Intake/Output Summary (Last 24 hours) at 12/15/2022 0853 Last data filed at 12/15/2022 0700 Gross per 24 hour  Intake 255.84 ml  Output 1325 ml  Net -1069.16 ml      12/15/2022    6:00 AM 12/14/2022    5:00 AM 12/13/2022    6:00 AM  Last 3 Weights  Weight (lbs) 173 lb 8 oz 173 lb 4.5 oz 172 lb 1.6 oz  Weight  (kg) 78.7 kg 78.6 kg 78.064 kg      Telemetry    Normal sinus rhythm- Personally Reviewed  ECG    Not performed today-personally Reviewed  Physical Exam   GEN: No acute distress.   Neck: No JVD Cardiac: Irregularly irregular, no murmurs, rubs, or gallops.  Respiratory: Clear to auscultation bilaterally. GI: Soft, nontender, non-distended  MS: No edema; No deformity. Neuro:  Nonfocal  Psych: Normal affect   Labs    High Sensitivity Troponin:   Recent Labs  Lab 12/10/22 1545 12/10/22 1837  TROPONINIHS 137* >24,000*     Chemistry Recent Labs  Lab 12/13/22 0015 12/14/22 1109 12/15/22 0310  NA 132* 130* 133*  K 4.6 4.0 4.6  CL 100 102 100  CO2 19* 22 22  GLUCOSE 111* 155* 158*  BUN 25* 28* 29*  CREATININE 1.19 1.10 1.35*  CALCIUM 9.2 8.4* 8.6*  MG 2.1 2.1 2.2  PROT 7.2  --   --   ALBUMIN 3.2*  --   --   AST 96*  --   --   ALT 61*  --   --  ALKPHOS 58  --   --   BILITOT 1.0  --   --   GFRNONAA >60 >60 58*  ANIONGAP 13 6 11     Lipids  Recent Labs  Lab 12/10/22 1648  CHOL 212*  TRIG 113  HDL 43  LDLCALC 146*  CHOLHDL 4.9    Hematology Recent Labs  Lab 12/10/22 1545 12/11/22 0301  WBC 8.4 15.4*  RBC 5.74 5.91*  HGB 16.5 17.2*  HCT 50.6 51.8  MCV 88.2 87.6  MCH 28.7 29.1  MCHC 32.6 33.2  RDW 11.9 12.2  PLT 259 275   Thyroid No results for input(s): "TSH", "FREET4" in the last 168 hours.  BNPNo results for input(s): "BNP", "PROBNP" in the last 168 hours.  DDimer No results for input(s): "DDIMER" in the last 168 hours.   Radiology    EP STUDY  Result Date: 12/14/2022 See surgical note for result.   Cardiac Studies   2D echo (12/11/2022)  IMPRESSIONS     1. Mid septum to apex is akinetic consistent with LAD infarction. No LV  apical thrombus on contrast imaging. Left ventricular ejection fraction,  by estimation, is 20 to 25%. The left ventricle has severely decreased  function. The left ventricle  demonstrates regional wall  motion abnormalities (see scoring  diagram/findings for description). There is moderate concentric left  ventricular hypertrophy. Left ventricular diastolic parameters are  consistent with Grade II diastolic dysfunction  (pseudonormalization).   2. Right ventricular systolic function is moderately reduced. The right  ventricular size is normal.   3. The mitral valve is grossly normal. Mild mitral valve regurgitation.  No evidence of mitral stenosis.   4. The aortic valve is tricuspid. Aortic valve regurgitation is trivial.  No aortic stenosis is present.   5. Aortic dilatation noted. There is mild dilatation of the ascending  aorta, measuring 42 mm.   6. The inferior vena cava is dilated in size with >50% respiratory  variability, suggesting right atrial pressure of 8 mmHg.   Cardiac catheterization/PCI and stent (12/10/2022)  Conclusion      2nd Diag lesion is 100% stenosed.   1.  Acute anterior STEMI, secondary to total occlusion of the proximal LAD.  Primary PCI performed with a 3.5 x 38 mm Synergy DES.  Baseline stenosis 100% with TIMI 0 flow.  Post-PCI stenosis 0% with TIMI-3 flow.  The second diagonal branch is totally occluded both pre and post PCI.  This branch was previously shown to be severely diseased. 2.  Continued patency of the mid RCA stent with moderate distal RCA stenosis of 60 to 70% 3.  Severe stenosis of a tortuous ramus intermedius branch, unchanged from the previous procedure 4.  Severe diffuse distal circumflex stenosis and a diabetic distal diffuse pattern 5.  Moderately elevated LVEDP   Recommendations: Resume aspirin and ticagrelor to be continued without interruption for at least 12 months.  Consider long-term P2 Y12 inhibition in this diabetic patient with extensive coronary artery disease, now presenting with a second STEMI event in a different vessel.  Otherwise routine post MI medical therapy, aggressive risk reduction measures, and check inpatient 2D  echocardiogram to assess degree of LV dysfunction.  Comparison(s): The left ventricular function is significantly worse  nostic Dominance: Right  Intervention      Patient Profile     67 y.o. male with  hypertension, hyperlipidemia, DM2, Mobitz 1, and history of CAD who is being seen 12/10/2022 for the evaluation of chest pain.   Assessment &  Plan    1: Anterior STEMI-patient presented with anterior STEMI on 12/10/2022.  He had had an inferior STEMI a year before and apparently was not compliant with his medications.  Dr. Excell Seltzer implanted a long drug-eluting stent in the ostial/proximal LAD with excellent result.  His RCA stent was patent.  Given his history of noncompliance we will transition him from ticagrelor to clopidogrel along with baby aspirin.  He will need uninterrupted DAPT for at least 12 months if not long-term DAPT.  Will continue "triple therapy for 1 month after which he can discontinue aspirin.  2: Abdominal pain-seen by gastroenterology because of abdominal pain and prominent fold in his stomach by CT.  Conservative therapy was recommended including twice daily PPI.  His abdominal pain has improved.  3: Essential hypertension-on metoprolol tartrate.  Blood pressure has improved after DC cardioversion.  Now systolic blood pressure in the low 100 range.   4: Diabetes-hemoglobin A1c 11.2.  On Jardiance at home.  On insulin as well.  Appreciate diabetes coordinator input.  5: Dyslipidemia-lipid profile on admission revealed total cholesterol 212 and LDL of 146.  He was noncompliant with his statin at home.  Currently on atorvastatin 80 and Zetia.  LDL goal in the 50-60 range.  6: Ischemic cardiomyopathy-EF by echo in the 25% range.  This is reduced from his prior EF in the 45% range after his inferior STEMI.  Blood pressure too low for GDMT.  On Jardiance.  Will continue beta-blocker (metoprolol succinate) and consider adding his ARB back once his hemodynamics stabilize.   Patient has a TOC heart failure clinic appointment on 12/17.  7: A-fib with RVR-initially his A-fib which is new with a heart rate of 145 resulting in hypotension.  He is currently on amiodarone and Eliquis (third dose given this morning).  His heart rate is down into the low 100 range.  Given his ongoing A-fib despite being on amiodarone 200 mg p.o. twice daily and Eliquis his blood pressure remains in the 80 range I decided to proceed with DC cardioversion which was successful after 1 shock.  He is currently in sinus rhythm at 81.  He is on Eliquis.  IV amiodarone will be transition to p.o. 200 mg p.o. twice daily.  Day 5 anterior STEMI.  LV dysfunction severe by 2D echo on appropriate medications.  Currently maintaining sinus rhythm after DC cardioversion yesterday.  On DAPT/triple therapy for 1 month after which aspirin can be discontinued.  He has an appointment with outpatient heart failure TOC on 12/17 after which she can follow-up with Dr. Jacinto Halim as an outpatient.  Okay for discharge today.   For questions or updates, please contact [facility] Please consult [email or web address] for contact info under        Signed, Nanetta Batty, MD  12/15/2022, 8:53 AM

## 2022-12-15 NOTE — Telephone Encounter (Signed)
-----   Message from Benancio Deeds sent at 12/12/2022 11:58 AM EST ----- Regarding: outpatient follow up Hi,  Can you help coordinate routine office visit with either Dr. Leonides Schanz or APP in the upcoming few months for post hospitalization - abdominal pain, abnormal CT scan abdomen, constipation. Thanks  Dr. Mervyn Skeeters

## 2022-12-16 NOTE — Anesthesia Postprocedure Evaluation (Signed)
Anesthesia Post Note  Patient: Garrett Roberts  Procedure(s) Performed: CARDIOVERSION     Patient location during evaluation: Cath Lab Anesthesia Type: General Level of consciousness: awake and alert Pain management: pain level controlled Vital Signs Assessment: post-procedure vital signs reviewed and stable Respiratory status: spontaneous breathing, nonlabored ventilation and respiratory function stable Cardiovascular status: blood pressure returned to baseline and stable Postop Assessment: no apparent nausea or vomiting Anesthetic complications: no   No notable events documented.                Jonice Cerra

## 2022-12-16 NOTE — Anesthesia Preprocedure Evaluation (Signed)
Anesthesia Evaluation  Patient identified by MRN, date of birth, ID band Patient awake    Reviewed: Allergy & Precautions, NPO status , Patient's Chart, lab work & pertinent test results  History of Anesthesia Complications Negative for: history of anesthetic complications  Airway Mallampati: III  TM Distance: >3 FB Neck ROM: Full    Dental  (+) Dental Advisory Given   Pulmonary neg pulmonary ROS   breath sounds clear to auscultation       Cardiovascular hypertension, + CAD and + Past MI  + dysrhythmias  Rhythm:Irregular     Neuro/Psych negative neurological ROS  negative psych ROS   GI/Hepatic negative GI ROS, Neg liver ROS,,,  Endo/Other  diabetes    Renal/GU negative Renal ROS     Musculoskeletal negative musculoskeletal ROS (+)    Abdominal   Peds  Hematology  (+) Blood dyscrasia eliquis   Anesthesia Other Findings   Reproductive/Obstetrics                             Anesthesia Physical Anesthesia Plan  ASA: 2  Anesthesia Plan: General   Post-op Pain Management: Minimal or no pain anticipated   Induction: Intravenous  PONV Risk Score and Plan: 2 and Treatment may vary due to age or medical condition  Airway Management Planned: Nasal Cannula, Natural Airway and Simple Face Mask  Additional Equipment: None  Intra-op Plan:   Post-operative Plan:   Informed Consent: I have reviewed the patients History and Physical, chart, labs and discussed the procedure including the risks, benefits and alternatives for the proposed anesthesia with the patient or authorized representative who has indicated his/her understanding and acceptance.     Dental advisory given  Plan Discussed with: CRNA  Anesthesia Plan Comments:        Anesthesia Quick Evaluation

## 2022-12-19 ENCOUNTER — Telehealth (HOSPITAL_COMMUNITY): Payer: Self-pay

## 2022-12-19 ENCOUNTER — Other Ambulatory Visit: Payer: Self-pay

## 2022-12-19 ENCOUNTER — Inpatient Hospital Stay (HOSPITAL_COMMUNITY)
Admission: EM | Admit: 2022-12-19 | Discharge: 2022-12-19 | DRG: 281 | Disposition: A | Discharge status: Home / Self Care | Payer: Self-pay | Attending: Internal Medicine | Admitting: Internal Medicine

## 2022-12-19 ENCOUNTER — Encounter (HOSPITAL_COMMUNITY): Payer: Self-pay | Admitting: *Deleted

## 2022-12-19 DIAGNOSIS — E118 Type 2 diabetes mellitus with unspecified complications: Secondary | ICD-10-CM | POA: Insufficient documentation

## 2022-12-19 DIAGNOSIS — I441 Atrioventricular block, second degree: Secondary | ICD-10-CM | POA: Diagnosis present

## 2022-12-19 DIAGNOSIS — I77810 Thoracic aortic ectasia: Secondary | ICD-10-CM | POA: Diagnosis present

## 2022-12-19 DIAGNOSIS — R109 Unspecified abdominal pain: Secondary | ICD-10-CM | POA: Diagnosis present

## 2022-12-19 DIAGNOSIS — I2510 Atherosclerotic heart disease of native coronary artery without angina pectoris: Secondary | ICD-10-CM

## 2022-12-19 DIAGNOSIS — R7989 Other specified abnormal findings of blood chemistry: Secondary | ICD-10-CM

## 2022-12-19 DIAGNOSIS — I255 Ischemic cardiomyopathy: Secondary | ICD-10-CM | POA: Diagnosis present

## 2022-12-19 DIAGNOSIS — I480 Paroxysmal atrial fibrillation: Principal | ICD-10-CM | POA: Diagnosis present

## 2022-12-19 DIAGNOSIS — Z7901 Long term (current) use of anticoagulants: Secondary | ICD-10-CM

## 2022-12-19 DIAGNOSIS — I4891 Unspecified atrial fibrillation: Secondary | ICD-10-CM

## 2022-12-19 DIAGNOSIS — E785 Hyperlipidemia, unspecified: Secondary | ICD-10-CM

## 2022-12-19 DIAGNOSIS — Z955 Presence of coronary angioplasty implant and graft: Secondary | ICD-10-CM

## 2022-12-19 DIAGNOSIS — I5022 Chronic systolic (congestive) heart failure: Secondary | ICD-10-CM | POA: Insufficient documentation

## 2022-12-19 DIAGNOSIS — I10 Essential (primary) hypertension: Secondary | ICD-10-CM | POA: Diagnosis present

## 2022-12-19 DIAGNOSIS — E1122 Type 2 diabetes mellitus with diabetic chronic kidney disease: Secondary | ICD-10-CM | POA: Diagnosis present

## 2022-12-19 DIAGNOSIS — R079 Chest pain, unspecified: Secondary | ICD-10-CM

## 2022-12-19 DIAGNOSIS — Z91014 Allergy to mammalian meats: Secondary | ICD-10-CM

## 2022-12-19 DIAGNOSIS — Z889 Allergy status to unspecified drugs, medicaments and biological substances status: Secondary | ICD-10-CM

## 2022-12-19 DIAGNOSIS — I340 Nonrheumatic mitral (valve) insufficiency: Secondary | ICD-10-CM | POA: Diagnosis present

## 2022-12-19 DIAGNOSIS — Z7982 Long term (current) use of aspirin: Secondary | ICD-10-CM

## 2022-12-19 DIAGNOSIS — Z833 Family history of diabetes mellitus: Secondary | ICD-10-CM

## 2022-12-19 DIAGNOSIS — Z7984 Long term (current) use of oral hypoglycemic drugs: Secondary | ICD-10-CM

## 2022-12-19 DIAGNOSIS — N182 Chronic kidney disease, stage 2 (mild): Secondary | ICD-10-CM | POA: Insufficient documentation

## 2022-12-19 DIAGNOSIS — I213 ST elevation (STEMI) myocardial infarction of unspecified site: Secondary | ICD-10-CM | POA: Diagnosis present

## 2022-12-19 DIAGNOSIS — I5023 Acute on chronic systolic (congestive) heart failure: Secondary | ICD-10-CM

## 2022-12-19 DIAGNOSIS — I130 Hypertensive heart and chronic kidney disease with heart failure and stage 1 through stage 4 chronic kidney disease, or unspecified chronic kidney disease: Secondary | ICD-10-CM | POA: Diagnosis present

## 2022-12-19 DIAGNOSIS — Z5986 Financial insecurity: Secondary | ICD-10-CM

## 2022-12-19 DIAGNOSIS — I252 Old myocardial infarction: Secondary | ICD-10-CM

## 2022-12-19 DIAGNOSIS — Z79899 Other long term (current) drug therapy: Secondary | ICD-10-CM

## 2022-12-19 DIAGNOSIS — Z7902 Long term (current) use of antithrombotics/antiplatelets: Secondary | ICD-10-CM

## 2022-12-19 HISTORY — DX: Unspecified atrial fibrillation: I48.91

## 2022-12-19 LAB — CBC
HCT: 47.5 % (ref 39.0–52.0)
Hemoglobin: 15.7 g/dL (ref 13.0–17.0)
MCH: 29.3 pg (ref 26.0–34.0)
MCHC: 33.1 g/dL (ref 30.0–36.0)
MCV: 88.8 fL (ref 80.0–100.0)
Platelets: 358 10*3/uL (ref 150–400)
RBC: 5.35 MIL/uL (ref 4.22–5.81)
RDW: 12.1 % (ref 11.5–15.5)
WBC: 11.1 10*3/uL — ABNORMAL HIGH (ref 4.0–10.5)
nRBC: 0 % (ref 0.0–0.2)

## 2022-12-19 LAB — BASIC METABOLIC PANEL
Anion gap: 16 — ABNORMAL HIGH (ref 5–15)
BUN: 22 mg/dL (ref 8–23)
CO2: 20 mmol/L — ABNORMAL LOW (ref 22–32)
Calcium: 8.9 mg/dL (ref 8.9–10.3)
Chloride: 98 mmol/L (ref 98–111)
Creatinine, Ser: 1.28 mg/dL — ABNORMAL HIGH (ref 0.61–1.24)
GFR, Estimated: >60 mL/min (ref >60)
Glucose, Bld: 160 mg/dL — ABNORMAL HIGH (ref 70–99)
Potassium: 4.1 mmol/L (ref 3.5–5.1)
Sodium: 134 mmol/L — ABNORMAL LOW (ref 135–145)

## 2022-12-19 LAB — MAGNESIUM: Magnesium: 2.4 mg/dL (ref 1.7–2.4)

## 2022-12-19 LAB — TROPONIN I (HIGH SENSITIVITY)
Troponin I (High Sensitivity): 4444 ng/L (ref <18)
Troponin I (High Sensitivity): 4222 ng/L (ref <18)

## 2022-12-19 LAB — TSH: TSH: 6.68 u[IU]/mL — ABNORMAL HIGH (ref 0.350–4.500)

## 2022-12-19 LAB — BRAIN NATRIURETIC PEPTIDE: B Natriuretic Peptide: 575.5 pg/mL — ABNORMAL HIGH (ref 0.0–100.0)

## 2022-12-19 MED ORDER — EZETIMIBE 10 MG PO TABS: 10.0000 mg | ORAL_TABLET | Freq: Every day | ORAL | Status: DC

## 2022-12-19 MED ORDER — ASPIRIN 81 MG PO CHEW: 324.0000 mg | CHEWABLE_TABLET | Freq: Once | ORAL | Status: DC

## 2022-12-19 MED ORDER — FUROSEMIDE 20 MG PO TABS: 20.0000 mg | ORAL_TABLET | Freq: Every day | ORAL | 1 refills | Status: DC | PRN

## 2022-12-19 MED ORDER — ASPIRIN 81 MG PO CHEW: 81.0000 mg | CHEWABLE_TABLET | Freq: Every day | ORAL | Status: DC

## 2022-12-19 MED ORDER — NITROGLYCERIN 0.4 MG SL SUBL: 0.4000 mg | SUBLINGUAL_TABLET | SUBLINGUAL | Status: DC | PRN

## 2022-12-19 MED ORDER — CLOPIDOGREL BISULFATE 75 MG PO TABS: 75.0000 mg | ORAL_TABLET | Freq: Every day | ORAL | Status: DC

## 2022-12-19 MED ORDER — APIXABAN 5 MG PO TABS
5.0000 mg | ORAL_TABLET | Freq: Two times a day (BID) | ORAL | Status: DC
Start: 2022-12-19 — End: 2022-12-19

## 2022-12-19 MED ORDER — AMIODARONE HCL 200 MG PO TABS: 200.0000 mg | ORAL_TABLET | Freq: Every day | ORAL | Status: DC

## 2022-12-19 MED ORDER — EMPAGLIFLOZIN 25 MG PO TABS
25.0000 mg | ORAL_TABLET | Freq: Every day | ORAL | Status: DC
  Filled 2022-12-19: qty 1

## 2022-12-19 MED ORDER — PANTOPRAZOLE SODIUM 40 MG PO TBEC
40.0000 mg | DELAYED_RELEASE_TABLET | Freq: Two times a day (BID) | ORAL | Status: DC
Start: 2022-12-19 — End: 2022-12-19

## 2022-12-19 MED ORDER — ONDANSETRON HCL 4 MG/2ML IJ SOLN: 4.0000 mg | Freq: Four times a day (QID) | INTRAMUSCULAR | Status: DC | PRN

## 2022-12-19 MED ORDER — AMIODARONE HCL 200 MG PO TABS: 200.0000 mg | ORAL_TABLET | Freq: Two times a day (BID) | ORAL | Status: DC

## 2022-12-19 MED ORDER — METOPROLOL SUCCINATE ER 25 MG PO TB24: 25.0000 mg | ORAL_TABLET | Freq: Every day | ORAL | Status: DC

## 2022-12-19 MED ORDER — ACETAMINOPHEN 325 MG PO TABS: 650.0000 mg | ORAL_TABLET | ORAL | Status: DC | PRN

## 2022-12-19 MED ORDER — ATORVASTATIN CALCIUM 40 MG PO TABS: 80.0000 mg | ORAL_TABLET | Freq: Every day | ORAL | Status: DC

## 2022-12-19 NOTE — Discharge Summary (Addendum)
Discharge Summary    Patient ID: Garrett Roberts MRN: [id number]; DOB: 1955/03/13  Admit date: 12/19/2022 Discharge date: 12/19/2022  PCP:  Pcp, No   [facility] Providers Cardiologist:  Elder Negus, MD        Discharge Diagnoses    Principal Problem:   Atrial fibrillation with RVR (HCC) Active Problems:   CAD (coronary artery disease)   Essential hypertension   Hyperlipidemia   Type 2 diabetes mellitus with complication, without long-term current use of insulin (HCC)   Ischemic cardiomyopathy   Chronic HFrEF (heart failure with reduced ejection fraction) (HCC)   CKD (chronic kidney disease), stage II    Diagnostic Studies/Procedures    None this admission. _____________   History of Present Illness     Garrett Roberts is a 67 y.o. male with a history of CAD with STEMI in 10/2021 s/p DES to RCA and recent STEMI on 12/10/2022 s/p DES to LAD, ischemic cardiomyopathy/ chronic HFrEF with EF of 25%, paroxysmal atrial fibrillation on Amiodarone and Eliquis, hypertension, hyperlipidemia, and type 2 diabetes mellitus.   Patient was recently admitted from 12/10/2022 to 12/15/2022 for an acute STEMI.  LHC showed 100% stenosis of proximal to mid LAD, patent stent to RCA, severe stenosis of a tortuous ramus Shefali Ng (unchanged from 2023), severe diffuse distal LCX disease, and an occluded 2nd Diag. He underwent successful PCI with DES to LAD. Echo showed LVEF of 20-25% with akinesis of mid septum and apex as well as moderate LVH, grade 2 diastolic dysfunction, and mild dilatation of the ascending aorta measuring 42mm.  Hospitalization was complicated by new onset atrial fibrillation and he underwent successful TEE/ DCCV on 12/14/2022. He was discharged on triple therapy with Aspirin, Plavix, and Eliquis with plans to stop the Aspirin after 30 days as well as Toprol-XL, Jardiance, and Amiodarone. GDMT was limited by soft BP.  He presented back to the ED on  12/19/2022 for further evaluation of palpitations that he described as an irregular heart beat with some associated chest discomfort. His chest discomfort improved after a dose of sublingual Nitroglycerin but he continued to have an irregular heart beats so he decided to come to the ED. EKG shwoed atrial fibrillation with evolving infarct in anteroseptal distribution consistent with recent STEMI. High-sensitivity troponin was elevated but down-trending at 4,444 >> 4,222 (peaked at >24,000 during recent admission). BNP 575. Chest x-ray showed no edema. WBC 11.1, Hgb 15.7, Plts 358. Na 134, K 4.1, Glucose 160, BUN 22, Cr 1.28.  Anion Gap slightly elevated at 16. Magnesium 2.4. TSH normal.  Patient was admitted for further management of atrial fibrillation but then converted back to sinus rhythm while waiting for a room.  Hospital Course     Consultants: None.   Paroxysmal Atrial Fibrillation with RVR Patient was recently diagnosed with atrial fibrillation during recent admission after MI.  He underwent successful TEE/ DCCV on 12/14/2022  with restoration of sinus rhythm and was discharged on Amiodarone. He presented back in atrial fibrillation with mildly elevated ventricular rate. Electrolytes and TSH normal. He thankfully converted back to normal sinus rhythm in the ED. Continue home PO Amiodarone load (currently on 200mg  twice daily with plans to decrease to once daily after 7 days). Continue Toprol-XL 25mg  daily. Continue Eliquis 5mg  twice daily.  CAD with Recent STEMI Patient was recently admitted with anterior STEMI on 12/10/2022 and underwent DES to LAD. LHC also showed severe stenosis of ramus intermedius Kani Jobson and distal LCX as well as occluded 2nd  Diag which were treated medically. High-sensitivity troponin peaked at >24,000 that admission. Repeat troponin this admission downtrending at 4,444 >> 4,222 (not felt to be new ACS). He had some mild chest discomfort initially that was likely due to  recurrent atrial fibrillation. Currently chest pain free. Continue DAPT with Aspirin and Plavix. Plan is to continue Aspirin for 30 days after MI and then this can be stopped. Continue beta-blocker and high-intensity statin/ Zetia.    Chronic HFrEF Ischemic Cardiomyopathy Echo during recent admission showed LVEF of 20-25% with akindesis of mid septum to apex but no apical thrombus as well as moderately reduced RV function.  BNP elevated at 575; however, chest x-ray showed no edema. He looks euvolemic on exam. Will provide PRN Lasix 20mg  for patient to take as needed for weight gain or worsening edema. Advised patient to check his BP prior to taking Lasix and to not take if systolic BP <100. Continue home Toprol-XL 25mg  daily and Jardiace 25mg  daily. He was not started on ACEi/ARB/ ARNI or MRA during recent admission due to soft BP. BP remains soft. Can continue to optimize GDMT as an outpatient if BP allows. Patient will need a repeat Echo in about 3 months after optimization of GDMT. If EF has not improved at that time, will need to consider ICD.   Hypertension BP soft but stable. Continue medications for CHF as above.   Hyperlipidemia Lipid panel on 12/10/2022: Total Cholesterol 212, Triglycerides 113, HDL 43, LDL 146. LDL goal <55.  Continue Lipitor 80mg  daily and Zetia 10mg  daily.  Will need repeat lipid panel and LFTs in about 6-8 weeks. He would benefit from a PSCK9 inhibitor; however, he has previously refused Insulin for his diabetes so I do not know if he would be agreeable to any injectable medications.    Type 2 Diabetes Mellitus Hemoglobin A1c 11.2 on 12/10/2022.  Continue home Metformin 500mg  daily and Jardiance 25mg  daily. Patient has refused use of Insulin as an outpatient. He will need to follow-up with PCP for further management of this.  CKD Stage II/ IIIa Creatinine stable at 1.28. Consider repeat BMET at follow-up visit.    Patient was seen and examined by Dr. Wyline Mood today and  felt to be stable for discharge. Outpatient follow-up arranged. Medications as below.       Did the patient have an acute coronary syndrome (MI, NSTEMI, STEMI, etc) this admission?:  No.   The elevated Troponin was due to the acute medical illness (demand ischemia).   _____________  Discharge Vitals Blood pressure 107/68, pulse 75, temperature 98.2 F (36.8 C), resp. rate 18, height 5\' 9"  (1.753 m), weight 78.7 kg, SpO2 100%.  Filed Weights   12/19/22 0245  Weight: 78.7 kg   General: 67 y.o. thin male resting comfortably in no acute distress. HEENT: Normocephalic and atraumatic. Sclera clear.  Neck: Supple. No JVD. Heart: RRR. Distinct S1 and S2. No murmurs, gallops, or rubs.  Lungs: No increased work of breathing. Clear to ausculation bilaterally. No wheezes, rhonchi, or rales.  Abdomen: Soft, non-distended, and non-tender to palpation. .  Extremities: No lower extremity edema.    Skin: Warm and dry. Neuro: Alert and oriented x3. No focal deficits. Psych: Normal affect. Responds appropriately.   Labs & Radiologic Studies    CBC Recent Labs    12/19/22 0252  WBC 11.1*  HGB 15.7  HCT 47.5  MCV 88.8  PLT 358   Basic Metabolic Panel Recent Labs    16/10/96 0252 12/19/22 0504  NA 134*  --   K 4.1  --   CL 98  --   CO2 20*  --   GLUCOSE 160*  --   BUN 22  --   CREATININE 1.28*  --   CALCIUM 8.9  --   MG  --  2.4   Liver Function Tests No results for input(s): "AST", "ALT", "ALKPHOS", "BILITOT", "PROT", "ALBUMIN" in the last 72 hours. No results for input(s): "LIPASE", "AMYLASE" in the last 72 hours. High Sensitivity Troponin:   Recent Labs  Lab 12/10/22 1545 12/10/22 1837 12/19/22 0252 12/19/22 0504  TROPONINIHS 137* >24,000* 4,444* 4,222*    BNP Invalid input(s): "POCBNP" D-Dimer No results for input(s): "DDIMER" in the last 72 hours. Hemoglobin A1C No results for input(s): "HGBA1C" in the last 72 hours. Fasting Lipid Panel No results for  input(s): "CHOL", "HDL", "LDLCALC", "TRIG", "CHOLHDL", "LDLDIRECT" in the last 72 hours. Thyroid Function Tests Recent Labs    12/19/22 0504  TSH 6.680*   _____________  EP STUDY  Result Date: 12/14/2022 See surgical note for result.  ECHOCARDIOGRAM COMPLETE  Result Date: 12/11/2022    ECHOCARDIOGRAM REPORT   Patient Name:   ELIJAN Roberts Date of Exam: 12/11/2022 Medical Rec #:  [id number]            Height:       69.0 in Accession #:    [id number]           Weight:       177.0 lb Date of Birth:  May 09, 1955           BSA:          1.962 m Patient Age:    67 years             BP:           128/81 mmHg Patient Gender: M                    HR:           85 bpm. Exam Location:  Inpatient Procedure: 2D Echo, Cardiac Doppler, Color Doppler and Intracardiac            Opacification Agent Indications:    R07.9* Chest pain, unspecified  History:        Patient has prior history of Echocardiogram examinations, most                 recent 10/31/2021. Acute MI and CAD, Signs/Symptoms:Chest Pain;                 Risk Factors:Hypertension and Dyslipidemia.  Sonographer:    Sheralyn Boatman RDCS Referring Phys: [id number] HAO MENG  Sonographer Comments: Patient is post PCIx2. IMPRESSIONS  1. Mid septum to apex is akinetic consistent with LAD infarction. No LV apical thrombus on contrast imaging. Left ventricular ejection fraction, by estimation, is 20 to 25%. The left ventricle has severely decreased function. The left ventricle demonstrates regional wall motion abnormalities (see scoring diagram/findings for description). There is moderate concentric left ventricular hypertrophy. Left ventricular diastolic parameters are consistent with Grade II diastolic dysfunction (pseudonormalization).  2. Right ventricular systolic function is moderately reduced. The right ventricular size is normal.  3. The mitral valve is grossly normal. Mild mitral valve regurgitation. No evidence of mitral stenosis.  4. The aortic valve is  tricuspid. Aortic valve regurgitation is trivial. No aortic stenosis is present.  5. Aortic dilatation noted. There is mild dilatation of the ascending aorta, measuring  42 mm.  6. The inferior vena cava is dilated in size with >50% respiratory variability, suggesting right atrial pressure of 8 mmHg. Comparison(s): The left ventricular function is significantly worse. Conclusion(s)/Recommendation(s): Findings consistent with ischemic cardiomyopathy. No left ventricular mural or apical thrombus/thrombi. FINDINGS  Left Ventricle: Mid septum to apex is akinetic consistent with LAD infarction. No LV apical thrombus on contrast imaging. Left ventricular ejection fraction, by estimation, is 20 to 25%. The left ventricle has severely decreased function. The left ventricle demonstrates regional wall motion abnormalities. Definity contrast agent was given IV to delineate the left ventricular endocardial borders. The left ventricular internal cavity size was normal in size. There is moderate concentric left ventricular hypertrophy. Left ventricular diastolic parameters are consistent with Grade II diastolic dysfunction (pseudonormalization).  LV Wall Scoring: The mid and distal anterior septum, mid inferoseptal segment, apical anterior segment, apical inferior segment, and apex are akinetic. Right Ventricle: The right ventricular size is normal. No increase in right ventricular wall thickness. Right ventricular systolic function is moderately reduced. Left Atrium: Left atrial size was normal in size. Right Atrium: Right atrial size was normal in size. Pericardium: There is no evidence of pericardial effusion. Mitral Valve: The mitral valve is grossly normal. Mild mitral valve regurgitation. No evidence of mitral valve stenosis. Tricuspid Valve: The tricuspid valve is grossly normal. Tricuspid valve regurgitation is trivial. No evidence of tricuspid stenosis. Aortic Valve: The aortic valve is tricuspid. Aortic valve  regurgitation is trivial. No aortic stenosis is present. Pulmonic Valve: The pulmonic valve was grossly normal. Pulmonic valve regurgitation is not visualized. No evidence of pulmonic stenosis. Aorta: Aortic dilatation noted and the aortic root is normal in size and structure. There is mild dilatation of the ascending aorta, measuring 42 mm. Venous: The inferior vena cava is dilated in size with greater than 50% respiratory variability, suggesting right atrial pressure of 8 mmHg. IAS/Shunts: The atrial septum is grossly normal. Additional Comments: There is a small pleural effusion in the left lateral region.  LEFT VENTRICLE PLAX 2D LVIDd:         4.70 cm      Diastology LVIDs:         4.50 cm      LV e' medial:    3.26 cm/s LV PW:         1.30 cm      LV E/e' medial:  26.2 LV IVS:        1.40 cm      LV e' lateral:   9.46 cm/s LVOT diam:     2.20 cm      LV E/e' lateral: 9.0 LV SV:         68 LV SV Index:   35 LVOT Area:     3.80 cm  LV Volumes (MOD) LV vol d, MOD A2C: 128.0 ml LV vol d, MOD A4C: 88.5 ml LV vol s, MOD A2C: 108.0 ml LV vol s, MOD A4C: 62.9 ml LV SV MOD A2C:     20.0 ml LV SV MOD A4C:     88.5 ml LV SV MOD BP:      24.9 ml RIGHT VENTRICLE             IVC RV S prime:     12.40 cm/s  IVC diam: 2.20 cm TAPSE (M-mode): 1.6 cm LEFT ATRIUM             Index        RIGHT ATRIUM  Index LA diam:        3.10 cm 1.58 cm/m   RA Area:     8.47 cm LA Vol (A2C):   56.4 ml 28.75 ml/m  RA Volume:   15.90 ml 8.11 ml/m LA Vol (A4C):   28.9 ml 14.73 ml/m LA Biplane Vol: 42.4 ml 21.61 ml/m  AORTIC VALVE LVOT Vmax:   111.00 cm/s LVOT Vmean:  69.800 cm/s LVOT VTI:    0.179 m  AORTA Ao Root diam: 3.60 cm Ao Asc diam:  4.10 cm MITRAL VALVE MV Area (PHT): 4.97 cm    SHUNTS MV Decel Time: 153 msec    Systemic VTI:  0.18 m MV E velocity: 85.47 cm/s  Systemic Diam: 2.20 cm MV A velocity: 83.07 cm/s MV E/A ratio:  1.03 Lennie Odor MD Electronically signed by Lennie Odor MD Signature Date/Time:  12/11/2022/12:03:26 PM    Final    CT ABDOMEN PELVIS W CONTRAST  Result Date: 12/10/2022 CLINICAL DATA:  Left upper and lower quadrant abdominal pain. EXAM: CT ABDOMEN AND PELVIS WITH CONTRAST TECHNIQUE: Multidetector CT imaging of the abdomen and pelvis was performed using the standard protocol following bolus administration of intravenous contrast. RADIATION DOSE REDUCTION: This exam was performed according to the departmental dose-optimization program which includes automated exposure control, adjustment of the mA and/or kV according to patient size and/or use of iterative reconstruction technique. CONTRAST:  75mL OMNIPAQUE IOHEXOL 350 MG/ML SOLN COMPARISON:  CT abdomen pelvis no contrast 05/11/2009. FINDINGS: Lower chest: The heart is slightly enlarged. There is calcification in the right coronary artery, small pericardial effusion. There are trace pleural effusions. Interlobular septal thickening is noted in both lung bases consistent with interstitial edema. Findings likely indicating mild CHF or fluid overload. There are scattered ground-glass opacities which are probably ground-glass edema, less likely ground-glass pneumonitis. Hepatobiliary: No focal liver abnormality is seen. No gallstones, gallbladder wall thickening, or biliary dilatation. Pancreas: No abnormality. Spleen: No abnormality. Adrenals/Urinary Tract: There is no adrenal mass. There is contrast in both renal collecting systems which would obscure intrarenal stones if present. There is no hydronephrosis, hydroureter or ureteral filling defect. There is symmetric contrast excretion on the delayed images. The bladder is filled with contrast. The bladder wall is unremarkable. Stomach/Bowel: There are moderate thickened folds in the lateral proximal to mid stomach consistent with gastritis. This has increased since 2011. Endoscopy may be indicated but no overt masslike wall thickening is seen. The unopacified small bowel is unremarkable. The  appendix is normal caliber. There is a tiny stone in the proximal appendix. There is moderate retained stool in the ascending colon. There is either a focal wall thickening or underdistention of the proximal transverse colon on 3: 34-38. Rest of the large bowel wall is largely contracted but otherwise unremarkable. Consider either a short interval follow-up study to see if this persists, BE, or colonoscopy follow-up unless recently done. Vascular/Lymphatic: There is moderate aortoiliac mixed plaque without AAA. No adenopathy is seen. Reproductive: Prostate is unremarkable. Other: No abdominal wall hernia or abnormality. No abdominopelvic ascites. Musculoskeletal: Mild degenerative change lumbar spine. Bridging osteophytes anterior right SI joint. Bone island left femoral head. No acute or other significant osseous findings. IMPRESSION: 1. Moderate thickened folds in the lateral proximal to mid stomach consistent with gastritis. This has increased since 2011. Endoscopy may be indicated but no overt masslike wall thickening is seen. 2. Focal wall thickening or underdistention of the proximal transverse colon. Consider either a short interval follow-up study to see if  this persists, BE, or colonoscopy follow-up unless recently done. 3. Cardiomegaly with trace pleural effusions and interstitial edema in the lung bases. 4. Scattered ground-glass opacities in the lung bases probably ground-glass edema, less likely ground-glass pneumonitis. 5. Aortic and coronary artery atherosclerosis. 6. Moderate retained stool in the ascending colon. Aortic Atherosclerosis (ICD10-I70.0). Electronically Signed   By: Almira Bar M.D.   On: 12/10/2022 23:09   CARDIAC CATHETERIZATION  Result Date: 12/10/2022   2nd Diag lesion is 100% stenosed. 1.  Acute anterior STEMI, secondary to total occlusion of the proximal LAD.  Primary PCI performed with a 3.5 x 38 mm Synergy DES.  Baseline stenosis 100% with TIMI 0 flow.  Post-PCI stenosis  0% with TIMI-3 flow.  The second diagonal Jacquelene Kopecky is totally occluded both pre and post PCI.  This Anistyn Graddy was previously shown to be severely diseased. 2.  Continued patency of the mid RCA stent with moderate distal RCA stenosis of 60 to 70% 3.  Severe stenosis of a tortuous ramus intermedius Arren Laminack, unchanged from the previous procedure 4.  Severe diffuse distal circumflex stenosis and a diabetic distal diffuse pattern 5.  Moderately elevated LVEDP Recommendations: Resume aspirin and ticagrelor to be continued without interruption for at least 12 months.  Consider long-term P2 Y12 inhibition in this diabetic patient with extensive coronary artery disease, now presenting with a second STEMI event in a different vessel.  Otherwise routine post MI medical therapy, aggressive risk reduction measures, and check inpatient 2D echocardiogram to assess degree of LV dysfunction.   DG Chest 2 View  Result Date: 12/10/2022 CLINICAL DATA:  Chest pain and shortness of breath EXAM: CHEST - 2 VIEW COMPARISON:  Chest x-ray 11/30/2021 FINDINGS: There are increased central interstitial markings bilaterally. There is a left upper lobe nodule measuring 3 mm, indeterminate. There is no lung consolidation, pleural effusion or pneumothorax. The cardiomediastinal silhouette is within normal limits. No acute fractures are seen. IMPRESSION: 1. Increased central interstitial markings bilaterally may represent bronchitis or atypical infection. 2. Left upper lobe nodule measuring 3 mm, indeterminate. No follow-up needed if patient is low-risk. Electronically Signed   By: Darliss Cheney M.D.   On: 12/10/2022 16:24   Disposition   Patient is being discharged home today in good condition.  Follow-up Plans & Appointments     Follow-up Information     [facility] Follow up.   Specialty: Cardiology Why: Please keep your follow-up visit in the [facility] on 12/27/2022 at 2pm. Contact  information: [address] [place] [address] [phone]        Gaston Islam., NP Follow up.   Specialty: Cardiology Why: Please keep your follow-up visit with General Cardiology on 01/15/2022 at 1:55pm. Contact information: [address] [place] [address] [phone]                   Discharge Medications   Allergies as of 12/19/2022       Reactions   Beef-derived Drug Products Other (See Comments)   Religious reasons   Pork-derived Products    Religious reasons        Medication List     TAKE these medications    amiodarone 200 MG tablet Commonly known as: PACERONE Take 1 tablet (200 mg total) by mouth 2 (two) times daily for 7 days, THEN 1 tablet (200 mg total) daily. Start taking on: December 15, 2022   Aspirin Low  Dose 81 MG chewable tablet Generic drug: aspirin Chew 1 tablet (81 mg total) by mouth daily.   atorvastatin 80 MG tablet Commonly known as: LIPITOR Take 1 tablet (80 mg total) by mouth at bedtime.   B-12 PO Take 1 tablet by mouth daily.   clopidogrel 75 MG tablet Commonly known as: PLAVIX Take 1 tablet (75 mg total) by mouth daily.   Eliquis 5 MG Tabs tablet Generic drug: apixaban Take 1 tablet (5 mg total) by mouth 2 (two) times daily.   ezetimibe 10 MG tablet Commonly known as: ZETIA Take 1 tablet (10 mg total) by mouth daily.   furosemide 20 MG tablet Commonly known as: Lasix Take 1 tablet (20 mg total) by mouth daily as needed (weight gain (3lbs in 1 day or 5lbs in 1 week) or worsening swelling).   Jardiance 25 MG Tabs tablet Generic drug: empagliflozin Take 1 tablet (25 mg total) by mouth daily before breakfast.   K2 PO Take 1 tablet by mouth daily.   MAGNESIUM PO Take 1 tablet by mouth daily.   metFORMIN 500 MG tablet Commonly known as: GLUCOPHAGE Take 1 tablet (500 mg total) by mouth daily with breakfast.   metoprolol succinate 25 MG 24 hr tablet Commonly  known as: TOPROL-XL Take 1 tablet (25 mg total) by mouth daily.   MULTIVITAMIN PO Take 1 tablet by mouth daily.   nitroGLYCERIN 0.4 MG SL tablet Commonly known as: NITROSTAT Place 1 tablet (0.4 mg total) under the tongue every 5 (five) minutes x 3 doses as needed for chest pain.   pantoprazole 40 MG tablet Commonly known as: PROTONIX Take 1 tablet (40 mg total) by mouth 2 (two) times daily.   VITAMIN C PO Take 1 tablet by mouth daily.   VITAMIN D PO Take 1 tablet by mouth daily.   ZINC PO Take 1 tablet by mouth daily.           Outstanding Labs/Studies   Repeat BMET at follow-up visit. Repeat lipid panel and LFTs in 6-8 weeks.  Duration of Discharge Encounter   Greater than 30 minutes including physician time.  Signed, Corrin Parker, PA-C 12/19/2022, 11:01 AM  Agree with above findings

## 2022-12-19 NOTE — Telephone Encounter (Signed)
Patient has been re-admitted to the hospital. Letter mailed to the patient. See "letters."

## 2022-12-19 NOTE — Telephone Encounter (Signed)
Patient Advocate Encounter  Patient assistance forms started for [employer], [employer]. Incomplete applications attached to media tab. Will connect with follow up provider to complete and fax applications in.  Pt currently scheduled for appointment with [facility] on 12/17.  Burnell Blanks, CPhT Rx Patient Advocate Phone: [phone]

## 2022-12-19 NOTE — ED Notes (Signed)
ED TO INPATIENT HANDOFF REPORT  ED Nurse Name and Phone #: Theophilus Bones [phone]  S Name/Age/Gender Garrett Roberts 67 y.o. male Room/Bed: 028C/028C  Code Status   Code Status: Full Code  Home/SNF/Other Home Patient oriented to: self, place, time, and situation Is this baseline? Yes   Triage Complete: Triage complete  Chief Complaint Atrial fibrillation with RVR (HCC) [I48.91]  Triage Note The pt  was awakened approx 2 hours ago with his heart pounding and beating irregular  he was also sob   he took a sl nitroglycerin also  no pain at present  he was cathed last week and had stents placed in his heart he is on a blood thinner.  He no longer has chest pain but his heart is still beating hard and it feels like it goes into his anterior throat   Allergies Allergies  Allergen Reactions   Beef-Derived Drug Products Other (See Comments)    Religious reasons   Pork-Derived Products     Religious reasons    Level of Care/Admitting Diagnosis ED Disposition     ED Disposition  Admit   Condition  --   Comment  Hospital Area: [facility] [[id number]]  Level of Care: Telemetry Cardiac [103]  May admit patient to [facility] or [facility] if equivalent level of care is available:: Yes  Covid Evaluation: Asymptomatic - no recent exposure (last 10 days) testing not required  Diagnosis: Atrial fibrillation with RVR [facility]) [[id number]]  Admitting Physician: Willette Alma [[id number]]  Attending Physician: Willette Alma [[id number]]  Certification:: I certify this patient will need inpatient services for at least 2 midnights  Expected Medical Readiness: 12/21/2022          B Medical/Surgery History Past Medical History:  Diagnosis Date   Coronary artery disease    Diabetes mellitus without complication (HCC)    Hypertension    Past Surgical History:  Procedure Laterality Date   CARDIOVERSION N/A 12/14/2022   Procedure: CARDIOVERSION;  Surgeon: Little Ishikawa, MD;  Location: [facility] INVASIVE CV LAB;  Service: Cardiovascular;  Laterality: N/A;   CORONARY/GRAFT ACUTE MI REVASCULARIZATION N/A 10/30/2021   Procedure: Coronary/Graft Acute MI Revascularization;  Surgeon: Yates Decamp, MD;  Location: [facility] INVASIVE CV LAB;  Service: Cardiovascular;  Laterality: N/A;   CORONARY/GRAFT ACUTE MI REVASCULARIZATION N/A 12/10/2022   Procedure: Coronary/Graft Acute MI Revascularization;  Surgeon: Tonny Bollman, MD;  Location: [facility] INVASIVE CV LAB;  Service: Cardiovascular;  Laterality: N/A;   LEFT HEART CATH AND CORONARY ANGIOGRAPHY N/A 10/30/2021   Procedure: LEFT HEART CATH AND CORONARY ANGIOGRAPHY;  Surgeon: Yates Decamp, MD;  Location: [facility] INVASIVE CV LAB;  Service: Cardiovascular;  Laterality: N/A;   LEFT HEART CATH AND CORONARY ANGIOGRAPHY N/A 12/10/2022   Procedure: LEFT HEART CATH AND CORONARY ANGIOGRAPHY;  Surgeon: Tonny Bollman, MD;  Location: [facility] INVASIVE CV LAB;  Service: Cardiovascular;  Laterality: N/A;     A IV Location/Drains/Wounds Patient Lines/Drains/Airways Status     Active Line/Drains/Airways     Name Placement date Placement time Site Days   Peripheral IV 12/19/22 18 G 1.16" Right Forearm 12/19/22  0301  Forearm  less than 1            Intake/Output Last 24 hours No intake or output data in the 24 hours ending 12/19/22 4132  Labs/Imaging Results for orders placed or performed during the hospital encounter of 12/19/22 (from the past 48 hour(s))  Basic metabolic panel     Status: Abnormal   Collection Time:  12/19/22  2:52 AM  Result Value Ref Range   Sodium 134 (L) 135 - 145 mmol/L   Potassium 4.1 3.5 - 5.1 mmol/L   Chloride 98 98 - 111 mmol/L   CO2 20 (L) 22 - 32 mmol/L   Glucose, Bld 160 (H) 70 - 99 mg/dL    Comment: Glucose reference range applies only to samples taken after fasting for at least 8 hours.   BUN 22 8 - 23 mg/dL   Creatinine, Ser 0.93 (H) 0.61 - 1.24 mg/dL   Calcium 8.9 8.9 - 26.7 mg/dL   GFR, Estimated  >12 >45 mL/min    Comment: (NOTE) Calculated using the CKD-EPI Creatinine Equation (2021)    Anion gap 16 (H) 5 - 15    Comment: Performed at [facility], [address], [place], [place] [address]  CBC     Status: Abnormal   Collection Time: 12/19/22  2:52 AM  Result Value Ref Range   WBC 11.1 (H) 4.0 - 10.5 K/uL   RBC 5.35 4.22 - 5.81 MIL/uL   Hemoglobin 15.7 13.0 - 17.0 g/dL   HCT 33.8 25.0 - 53.9 %   MCV 88.8 80.0 - 100.0 fL   MCH 29.3 26.0 - 34.0 pg   MCHC 33.1 30.0 - 36.0 g/dL   RDW 76.7 34.1 - 93.7 %   Platelets 358 150 - 400 K/uL   nRBC 0.0 0.0 - 0.2 %    Comment: Performed at [facility], [address], [place], [place] [address]  Troponin I (High Sensitivity)     Status: Abnormal   Collection Time: 12/19/22  2:52 AM  Result Value Ref Range   Troponin I (High Sensitivity) 4,444 (HH) <18 ng/L    Comment: CRITICAL RESULT CALLED TO, READ BACK BY AND VERIFIED WITH JULIE WHITLY RN @0342  12/19/22 SATRAIN (NOTE) Elevated high sensitivity troponin I (hsTnI) values and significant  changes across serial measurements may suggest ACS but many other  chronic and acute conditions are known to elevate hsTnI results.  Refer to the "Links" section for chest pain algorithms and additional  guidance. Performed at [facility], [address], [place], [place] [address]   Brain natriuretic peptide     Status: Abnormal   Collection Time: 12/19/22  2:52 AM  Result Value Ref Range   B Natriuretic Peptide 575.5 (H) 0.0 - 100.0 pg/mL    Comment: Performed at [facility], [address] [place], [place] [address]  Troponin I (High Sensitivity)     Status: Abnormal   Collection Time: 12/19/22  5:04 AM  Result Value Ref Range   Troponin I (High Sensitivity) 4,222 (HH) <18 ng/L    Comment: CRITICAL VALUE NOTED. VALUE IS CONSISTENT WITH PREVIOUSLY REPORTED/CALLED VALUE DELTA CHECK NOTED (NOTE) Elevated high sensitivity troponin I (hsTnI) values and significant   changes across serial measurements may suggest ACS but many other  chronic and acute conditions are known to elevate hsTnI results.  Refer to the "Links" section for chest pain algorithms and additional  guidance. Performed at [facility], [address], [place], [place] [address]   Magnesium     Status: None   Collection Time: 12/19/22  5:04 AM  Result Value Ref Range   Magnesium 2.4 1.7 - 2.4 mg/dL    Comment: Performed at [facility], [address] [place], [place] [address]  TSH     Status: Abnormal   Collection Time: 12/19/22  5:04 AM  Result Value Ref Range   TSH 6.680 (H) 0.350 - 4.500 uIU/mL    Comment: Performed by a 3rd Generation assay with a functional sensitivity of <=0.01 uIU/mL. Performed at [facility] Lab, [address] [place], [place] [address]    No results found.  Pending Labs Unresulted Labs (From admission, onward)    None       Vitals/Pain Today's Vitals   12/19/22 0238 12/19/22 0245 12/19/22 0600 12/19/22 0627  BP: 104/71  110/73   Pulse: 97  85   Resp: 18  19   Temp: 98.5 F (36.9 C)   98.2 F (36.8 C)  TempSrc: Oral     SpO2: 100%  99%   Weight:  78.7 kg    Height:  5\' 9"  (1.753 m)    PainSc:  0-No pain      Isolation Precautions No active isolations  Medications Medications  aspirin chewable tablet 81 mg (has no administration in time range)  amiodarone (PACERONE) tablet 200 mg (has no administration in time range)    Followed by  amiodarone (PACERONE) tablet 200 mg (has no administration in time range)  atorvastatin (LIPITOR) tablet 80 mg (has no administration in time range)  ezetimibe (ZETIA) tablet 10 mg (has no administration in time range)  metoprolol succinate (TOPROL-XL) 24 hr tablet 25 mg (has no administration in time range)  nitroGLYCERIN (NITROSTAT) SL tablet 0.4 mg (has no administration in time range)  empagliflozin (JARDIANCE) tablet 25 mg (has no administration in time range)  pantoprazole  (PROTONIX) EC tablet 40 mg (has no administration in time range)  apixaban (ELIQUIS) tablet 5 mg (has no administration in time range)  clopidogrel (PLAVIX) tablet 75 mg (has no administration in time range)  acetaminophen (TYLENOL) tablet 650 mg (has no administration in time range)  ondansetron (ZOFRAN) injection 4 mg (has no administration in time range)    Mobility walks     Focused Assessments Cardiac Assessment Handoff:  Cardiac Rhythm: Normal sinus rhythm No results found for: "CKTOTAL", "CKMB", "CKMBINDEX", "TROPONINI" No results found for: "DDIMER" Does the Patient currently have chest pain? No    R Recommendations: See Admitting Provider Note  Report given to:   Additional Notes:

## 2022-12-19 NOTE — ED Triage Notes (Signed)
The pt  was awakened approx 2 hours ago with his heart pounding and beating irregular  he was also sob   he took a sl nitroglycerin also  no pain at present  he was cathed last week and had stents placed in his heart he is on a blood thinner.  He no longer has chest pain but his heart is still beating hard and it feels like it goes into his anterior throat

## 2022-12-19 NOTE — ED Provider Notes (Signed)
[place] EMERGENCY DEPARTMENT AT [facility] Provider Note   CSN: [id number] Arrival date & time: 12/19/22  0231     History  Chief Complaint  Patient presents with   Chest Pain    Garrett Mccarville is a 67 y.o. male.  Presents to the emergency department for evaluation of pounding heartbeat with irregular and fast heartbeat.  He did have some chest discomfort as well.  He did take a nitroglycerin, chest discomfort has improved but his heart rate is still irregular and he feels his heart pounding.       Home Medications Prior to Admission medications   Medication Sig Start Date End Date Taking? Authorizing Provider  amiodarone (PACERONE) 200 MG tablet Take 1 tablet (200 mg total) by mouth 2 (two) times daily for 7 days, THEN 1 tablet (200 mg total) daily. 12/15/22 02/20/23  Arty Baumgartner, NP  apixaban (ELIQUIS) 5 MG TABS tablet Take 1 tablet (5 mg total) by mouth 2 (two) times daily. 12/15/22   Arty Baumgartner, NP  Ascorbic Acid (VITAMIN C PO) Take 1 tablet by mouth daily.    [provider]  aspirin 81 MG chewable tablet Chew 1 tablet (81 mg total) by mouth daily. 12/15/22   Arty Baumgartner, NP  atorvastatin (LIPITOR) 80 MG tablet Take 1 tablet (80 mg total) by mouth at bedtime. 12/15/22   Arty Baumgartner, NP  clopidogrel (PLAVIX) 75 MG tablet Take 1 tablet (75 mg total) by mouth daily. 12/16/22   Arty Baumgartner, NP  Cyanocobalamin (B-12 PO) Take 1 tablet by mouth daily.    [provider]  empagliflozin (JARDIANCE) 25 MG TABS tablet Take 1 tablet (25 mg total) by mouth daily before breakfast. 12/15/22   Arty Baumgartner, NP  ezetimibe (ZETIA) 10 MG tablet Take 1 tablet (10 mg total) by mouth daily. 12/16/22   Laverda Page B, NP  MAGNESIUM PO Take 1 tablet by mouth daily.    [provider]  Menaquinone-7 (K2 PO) Take 1 tablet by mouth daily.    [provider]  metFORMIN (GLUCOPHAGE) 500 MG tablet Take 1 tablet  (500 mg total) by mouth daily with breakfast. 12/15/22   Laverda Page B, NP  metoprolol succinate (TOPROL-XL) 25 MG 24 hr tablet Take 1 tablet (25 mg total) by mouth daily. 12/16/22   Arty Baumgartner, NP  Multiple Vitamin (MULTIVITAMIN PO) Take 1 tablet by mouth daily.    [provider]  Multiple Vitamins-Minerals (ZINC PO) Take 1 tablet by mouth daily.    [provider]  nitroGLYCERIN (NITROSTAT) 0.4 MG SL tablet Place 1 tablet (0.4 mg total) under the tongue every 5 (five) minutes x 3 doses as needed for chest pain. 12/15/22   Arty Baumgartner, NP  pantoprazole (PROTONIX) 40 MG tablet Take 1 tablet (40 mg total) by mouth 2 (two) times daily. 12/15/22   Arty Baumgartner, NP  VITAMIN D PO Take 1 tablet by mouth daily.    [provider]      Allergies    Beef-derived drug products and Pork-derived products    Review of Systems   Review of Systems  Physical Exam Updated Vital Signs BP 104/71 (BP Location: Right Arm)   Pulse 97   Temp 98.5 F (36.9 C) (Oral)   Resp 18   Ht 5\' 9"  (1.753 m)   Wt 78.7 kg   SpO2 100%   BMI 25.62 kg/m  Physical Exam Vitals and nursing note  reviewed.  Constitutional:      General: He is not in acute distress.    Appearance: He is well-developed.  HENT:     Head: Normocephalic and atraumatic.     Mouth/Throat:     Mouth: Mucous membranes are moist.  Eyes:     General: Vision grossly intact. Gaze aligned appropriately.     Extraocular Movements: Extraocular movements intact.     Conjunctiva/sclera: Conjunctivae normal.  Cardiovascular:     Rate and Rhythm: Normal rate. Rhythm irregularly irregular.     Pulses: Normal pulses.     Heart sounds: Normal heart sounds, S1 normal and S2 normal. No murmur heard.    No friction rub. No gallop.  Pulmonary:     Effort: Pulmonary effort is normal. No respiratory distress.     Breath sounds: Normal breath sounds.  Abdominal:     Palpations: Abdomen is soft.      Tenderness: There is no abdominal tenderness. There is no guarding or rebound.     Hernia: No hernia is present.  Musculoskeletal:        General: No swelling.     Cervical back: Full passive range of motion without pain, normal range of motion and neck supple. No pain with movement, spinous process tenderness or muscular tenderness. Normal range of motion.     Right lower leg: No edema.     Left lower leg: No edema.  Skin:    General: Skin is warm and dry.     Capillary Refill: Capillary refill takes less than 2 seconds.     Findings: No ecchymosis, erythema, lesion or wound.  Neurological:     Mental Status: He is alert and oriented to person, place, and time.     GCS: GCS eye subscore is 4. GCS verbal subscore is 5. GCS motor subscore is 6.     Cranial Nerves: Cranial nerves 2-12 are intact.     Sensory: Sensation is intact.     Motor: Motor function is intact. No weakness or abnormal muscle tone.     Coordination: Coordination is intact.  Psychiatric:        Mood and Affect: Mood normal.        Speech: Speech normal.        Behavior: Behavior normal.     ED Results / Procedures / Treatments   Labs (all labs ordered are listed, but only abnormal results are displayed) Labs Reviewed  BASIC METABOLIC PANEL - Abnormal; Notable for the following components:      Result Value   Sodium 134 (*)    CO2 20 (*)    Glucose, Bld 160 (*)    Creatinine, Ser 1.28 (*)    Anion gap 16 (*)    All other components within normal limits  CBC - Abnormal; Notable for the following components:   WBC 11.1 (*)    All other components within normal limits  BRAIN NATRIURETIC PEPTIDE - Abnormal; Notable for the following components:   B Natriuretic Peptide 575.5 (*)    All other components within normal limits  TROPONIN I (HIGH SENSITIVITY) - Abnormal; Notable for the following components:   Troponin I (High Sensitivity) 4,444 (*)    All other components within normal limits    EKG EKG  Interpretation Date/Time:  Monday December 19 2022 02:44:16 EST Ventricular Rate:  105 PR Interval:    QRS Duration:  102 QT Interval:  294 QTC Calculation: 388 R Axis:   1  Text Interpretation:  Atrial fibrillation with rapid ventricular response Anteroseptal infarct , possibly acute Abnormal ECG When compared with ECG of 14-Dec-2022 15:20, Anterior ST elevations now with Q waves, likely sub-acute/evolving (known STEMI on 12/10/2022) Confirmed by Gilda Crease [phone]) on 12/19/2022 4:12:05 AM  Radiology No results found.  Procedures Procedures    Medications Ordered in ED Medications  aspirin chewable tablet 324 mg (324 mg Oral Not Given 12/19/22 0309)    ED Course/ Medical Decision Making/ A&P                                 Medical Decision Making Amount and/or Complexity of Data Reviewed External Data Reviewed: labs, radiology, ECG and notes. Labs: ordered. Decision-making details documented in ED Course. Radiology: ordered and independent interpretation performed. Decision-making details documented in ED Course. ECG/medicine tests: ordered and independent interpretation performed. Decision-making details documented in ED Course.  Risk OTC drugs.   Differential Diagnosis considered includes, but not limited to: STEMI; NSTEMI; myocarditis; pericarditis; pulmonary embolism; aortic dissection; pneumothorax; pneumonia; gastritis; musculoskeletal pain  Presents to the emergency department for chest discomfort.  Patient with slight chest pain earlier that did resolve with nitroglycerin.  Patient's main complaint at presentation is a pounding and irregular heartbeat.  Recent hospitalization was reviewed.  Patient presented on November 30 with STEMI and was found to have an LAD lesion that was stented.  He did have postprocedure atrial fibrillation and had cardioversion performed prior to discharge.  Patient now on Eliquis, aspirin, Plavix, amiodarone.  He took all of  his medications this evening.  EKG at arrival with ST elevations anteriorly.  This is in the same pattern as a STEMI, now associated with Q waves.  These findings are felt to be consistent with evolving MI from original presentation.  Troponin is 4444.  Last troponin in the hospital was greater than 24,000.  Will need a second troponin to determine if this is trending down, which is felt to be likely.  Current presentation is felt to be consistent with recurrent atrial fibrillation with slightly fast ventricular response.  Discussed with Dr. Evette Doffing, on-call for cardiology who will see the patient.  CRITICAL CARE Performed by: Gilda Crease   Total critical care time: 38 minutes  Critical care time was exclusive of separately billable procedures and treating other patients.  Critical care was necessary to treat or prevent imminent or life-threatening deterioration.  Critical care was time spent personally by me on the following activities: development of treatment plan with patient and/or surrogate as well as nursing, discussions with consultants, evaluation of patient's response to treatment, examination of patient, obtaining history from patient or surrogate, ordering and performing treatments and interventions, ordering and review of laboratory studies, ordering and review of radiographic studies, pulse oximetry and re-evaluation of patient's condition.         Final Clinical Impression(s) / ED Diagnoses Final diagnoses:  Chest pain, unspecified type  Atrial fibrillation with RVR [facility])    Rx / DC Orders ED Discharge Orders     None         Keniyah Gelinas, Canary Brim, MD 12/19/22 [phone]

## 2022-12-19 NOTE — Discharge Instructions (Signed)
Heart Failure Education: Weigh yourself EVERY morning after you go to the bathroom but before you eat or drink anything. Write this number down in a weight log/diary. If you gain 3 pounds overnight or 5 pounds in a week, you can take a dose of Lasix 20mg . If weight does not improve, please call our office. Take your medicines as prescribed. If you have concerns about your medications, please call us before you stop taking them.  Eat low salt foods--Limit salt (sodium) to 2000 mg per day. This will help prevent your body from holding onto fluid. Read food labels as many processed foods have a lot of sodium, especially canned goods and prepackaged meats. If you would like some assistance choosing low sodium foods, we would be happy to set you up with a nutritionist. Limit all fluids for the day to less than 2 liters (64 ounces). Fluid includes all drinks, coffee, juice, ice chips, soup, jello, and all other liquids. Stay as active as you can everyday. Staying active will give you more energy and make your muscles stronger. Start with 5 minutes at a time and work your way up to 30 minutes a day. Break up your activities--do some in the morning and some in the afternoon. Start with 3 days per week and work your way up to 5 days as you can.  If you have chest pain, feel short of breath, dizzy, or lightheaded, STOP. If you don't feel better after a short rest, call 911. If you do feel better, call the office to let us know you have symptoms with exercise.

## 2022-12-19 NOTE — ED Notes (Signed)
Pt took 3x 81mg  aspirin prior to arrival. Dr. Blinda Leatherwood made aware.

## 2022-12-19 NOTE — Progress Notes (Signed)
Heart Failure Navigator Progress Note  Assessed for Heart & Vascular TOC clinic readiness.  Patient has a scheduled HF TOC appointment on 12/27/2022. .   Navigator will sign off at this time.   Rhae Hammock, BSN, Scientist, clinical (histocompatibility and immunogenetics) Only

## 2022-12-19 NOTE — H&P (Addendum)
Cardiology Admission History and Physical:   Patient ID: Garrett Roberts MRN: [id number]; DOB: 08/26/1955   Admission date: 12/19/2022  Primary Care Provider: Pcp, No Primary Cardiologist: Elder Negus, MD  Primary Electrophysiologist:  None   Chief Complaint:  irregular heart beat  Patient Profile:   Garrett Roberts is a 67 y.o. male with pmhx anterior STEMI (12/10/22) s/p DES and inferior STEMI (10/30/21) s/p DES, DM2, Mobitz 1 who presents with irregular heart beat.  History of Present Illness:   Garrett Roberts is a 67 yo M with pmhx HTN, HLD, DM2, Mobitz I, inferior STEMI (10/30/21) and very recent anterior STEMI (12/10/22) who presents with irregular heart beat.  Pt was recently admitted from 12/10/22 - 12/15/22 with anterior STEMI. LHC showed total occlusion of pLAD s/p DES. Post-PCI he had TIMI 3 flow in LAD, but D2 was totally occluded both pre and post PCI. LHC also showed continued patency of mRCA stent and moderate distal RCA stenosis, severe stenosis of tortuous ramus intermedius branch and severe diffuse LCX stenosis. After successful DES to pLAD, he was placed on aspirin and ticagrelor for 1 year with consideration of long term P2Y12 inhibition due to concurrent diabetes and STEMI of 2 separate vessels (RCA and LAD). Pre-discharge ECHO showed LVEF 20-25% with regional WMA and moderately reduced RV function. His post-cath course was complicated by atrial fibrillation for which he was cardioverted with TEE/DCCV to NSR and started on apixaban and amiodarone. Due to afib, he was converted from Brilinta to Plavix.  Today, pt reports he came to the ER with pounding heartbeat that was irregular and fast. He had some associated chest discomfort and took nitroglycerin. His chest discomfort has improved, but he still feels irregular heart beat. EKG on admission shows atrial fibrillation with evolving infarct in anteroseptal distribution (with q-waves) consistent with recent  prior anterior STEMI. CMP is wnl (Cr 1.28 at baseline), but troponin elevated to 4,444 in the setting of recent anterior STEMI (trop >24,000 on 11/30) with instrumentation. Otherwise, he is afebrile, HR currently 87 and normotensive with BP 100/70s.   He will be admitted to cardiology for atrial fibrillation and consideration of DCCV (likely does not need TEE given compliance with apixaban)..  Past Medical History:  Diagnosis Date   Coronary artery disease    Diabetes mellitus without complication (HCC)    Hypertension     Past Surgical History:  Procedure Laterality Date   CARDIOVERSION N/A 12/14/2022   Procedure: CARDIOVERSION;  Surgeon: Little Ishikawa, MD;  Location: [facility] INVASIVE CV LAB;  Service: Cardiovascular;  Laterality: N/A;   CORONARY/GRAFT ACUTE MI REVASCULARIZATION N/A 10/30/2021   Procedure: Coronary/Graft Acute MI Revascularization;  Surgeon: Yates Decamp, MD;  Location: [facility] INVASIVE CV LAB;  Service: Cardiovascular;  Laterality: N/A;   CORONARY/GRAFT ACUTE MI REVASCULARIZATION N/A 12/10/2022   Procedure: Coronary/Graft Acute MI Revascularization;  Surgeon: Tonny Bollman, MD;  Location: [facility] INVASIVE CV LAB;  Service: Cardiovascular;  Laterality: N/A;   LEFT HEART CATH AND CORONARY ANGIOGRAPHY N/A 10/30/2021   Procedure: LEFT HEART CATH AND CORONARY ANGIOGRAPHY;  Surgeon: Yates Decamp, MD;  Location: [facility] INVASIVE CV LAB;  Service: Cardiovascular;  Laterality: N/A;   LEFT HEART CATH AND CORONARY ANGIOGRAPHY N/A 12/10/2022   Procedure: LEFT HEART CATH AND CORONARY ANGIOGRAPHY;  Surgeon: Tonny Bollman, MD;  Location: [facility] INVASIVE CV LAB;  Service: Cardiovascular;  Laterality: N/A;     Medications Prior to Admission: Prior to Admission medications   Medication Sig Start Date End Date Taking? Authorizing Provider  amiodarone (PACERONE) 200 MG tablet Take 1 tablet (200 mg total) by mouth 2 (two) times daily for 7 days, THEN 1 tablet (200 mg total) daily. 12/15/22 02/20/23   Arty Baumgartner, NP  apixaban (ELIQUIS) 5 MG TABS tablet Take 1 tablet (5 mg total) by mouth 2 (two) times daily. 12/15/22   Arty Baumgartner, NP  Ascorbic Acid (VITAMIN C PO) Take 1 tablet by mouth daily.    [provider]  aspirin 81 MG chewable tablet Chew 1 tablet (81 mg total) by mouth daily. 12/15/22   Arty Baumgartner, NP  atorvastatin (LIPITOR) 80 MG tablet Take 1 tablet (80 mg total) by mouth at bedtime. 12/15/22   Arty Baumgartner, NP  clopidogrel (PLAVIX) 75 MG tablet Take 1 tablet (75 mg total) by mouth daily. 12/16/22   Arty Baumgartner, NP  Cyanocobalamin (B-12 PO) Take 1 tablet by mouth daily.    [provider]  empagliflozin (JARDIANCE) 25 MG TABS tablet Take 1 tablet (25 mg total) by mouth daily before breakfast. 12/15/22   Arty Baumgartner, NP  ezetimibe (ZETIA) 10 MG tablet Take 1 tablet (10 mg total) by mouth daily. 12/16/22   Laverda Page B, NP  MAGNESIUM PO Take 1 tablet by mouth daily.    [provider]  Menaquinone-7 (K2 PO) Take 1 tablet by mouth daily.    [provider]  metFORMIN (GLUCOPHAGE) 500 MG tablet Take 1 tablet (500 mg total) by mouth daily with breakfast. 12/15/22   Laverda Page B, NP  metoprolol succinate (TOPROL-XL) 25 MG 24 hr tablet Take 1 tablet (25 mg total) by mouth daily. 12/16/22   Arty Baumgartner, NP  Multiple Vitamin (MULTIVITAMIN PO) Take 1 tablet by mouth daily.    [provider]  Multiple Vitamins-Minerals (ZINC PO) Take 1 tablet by mouth daily.    [provider]  nitroGLYCERIN (NITROSTAT) 0.4 MG SL tablet Place 1 tablet (0.4 mg total) under the tongue every 5 (five) minutes x 3 doses as needed for chest pain. 12/15/22   Arty Baumgartner, NP  pantoprazole (PROTONIX) 40 MG tablet Take 1 tablet (40 mg total) by mouth 2 (two) times daily. 12/15/22   Arty Baumgartner, NP  VITAMIN D PO Take 1 tablet by mouth daily.    [provider]     Allergies:    Allergies   Allergen Reactions   Beef-Derived Drug Products Other (See Comments)    Religious reasons   Pork-Derived Products     Religious reasons    Social History:   Social History   Socioeconomic History   Marital status: Married    Spouse name: Amani   Number of children: 5   Years of education: Not on file   Highest education level: High school graduate  Occupational History   Occupation: part time  Tobacco Use   Smoking status: Never   Smokeless tobacco: Not on file  Vaping Use   Vaping status: Never Used  Substance and Sexual Activity   Alcohol use: No   Drug use: No   Sexual activity: Not on file  Other Topics Concern   Not on file  Social History Narrative   ** Merged History Encounter **       Social Determinants of Health   Financial Resource Strain: High Risk (12/13/2022)   Overall Financial Resource Strain (CARDIA)    Difficulty of Paying Living Expenses: Very hard  Food Insecurity: No Food Insecurity (12/10/2022)   Hunger  Vital Sign    Worried About Programme researcher, broadcasting/film/video in the Last Year: Never true    Ran Out of Food in the Last Year: Never true  Transportation Needs: No Transportation Needs (12/10/2022)   PRAPARE - Administrator, Civil Service (Medical): No    Lack of Transportation (Non-Medical): No  Physical Activity: Sufficiently Active (11/27/2019)   Received from [facility], [facility]   Exercise Vital Sign    Days of Exercise per Week: 4 days    Minutes of Exercise per Session: 60 min  Stress: Stress Concern Present (11/27/2019)   Received from [facility], [facility] - Occupational Stress Questionnaire    Feeling of Stress : To some extent  Social Connections: Unknown (05/25/2021)   Received from [facility], [facility]   Social Network    Social Network: Not on file  Intimate Partner Violence: Not At Risk (12/10/2022)   Humiliation, Afraid, Rape, and Kick questionnaire     Fear of Current or Ex-Partner: No    Emotionally Abused: No    Physically Abused: No    Sexually Abused: No    Family History:   The patient's family history includes Chronic Renal Failure in his brother and father; Diabetes in his mother.    Review of Systems: [y] = yes, [ ]  = no  12 pt ROS neg except what is documented in HPI  Physical Exam/Data:   Vitals:   12/19/22 0238 12/19/22 0245  BP: 104/71   Pulse: 97   Resp: 18   Temp: 98.5 F (36.9 C)   TempSrc: Oral   SpO2: 100%   Weight:  78.7 kg  Height:  5\' 9"  (1.753 m)   No intake or output data in the 24 hours ending 12/19/22 0437 Filed Weights   12/19/22 0245  Weight: 78.7 kg   Body mass index is 25.62 kg/m.  GEN: No acute distress.   Neck: No JVD Cardiac: Irregularly irregular, no murmurs, rubs, or gallops.  Respiratory: Clear to auscultation bilaterally. GI: Soft, nontender, non-distended  MS: No edema; No deformity. Neuro:  Nonfocal  Psych: Normal affect   EKG:  The ECG that was done and personally reviewed and demonstrates: atrial fibrillation, elevations in anteroseptal leads with q-waves c/w evolving anterior infarct  Relevant CV Studies: ECHO 12/11/22  1. Mid septum to apex is akinetic consistent with LAD infarction. No LV  apical thrombus on contrast imaging. Left ventricular ejection fraction,  by estimation, is 20 to 25%. The left ventricle has severely decreased  function. The left ventricle  demonstrates regional wall motion abnormalities (see scoring  diagram/findings for description). There is moderate concentric left  ventricular hypertrophy. Left ventricular diastolic parameters are  consistent with Grade II diastolic dysfunction  (pseudonormalization).   2. Right ventricular systolic function is moderately reduced. The right  ventricular size is normal.   3. The mitral valve is grossly normal. Mild mitral valve regurgitation.  No evidence of mitral stenosis.   4. The aortic valve is  tricuspid. Aortic valve regurgitation is trivial.  No aortic stenosis is present.   5. Aortic dilatation noted. There is mild dilatation of the ascending  aorta, measuring 42 mm.   6. The inferior vena cava is dilated in size with >50% respiratory  variability, suggesting right atrial pressure of 8 mmHg.   LHC 12/10/22 2nd Diag lesion is 100% stenosed.  1.  Acute anterior STEMI, secondary to total occlusion of the  proximal LAD.  Primary PCI performed with a 3.5 x 38 mm Synergy DES.  Baseline stenosis 100% with TIMI 0 flow.  Post-PCI stenosis 0% with TIMI-3 flow.  The second diagonal branch is totally occluded both pre and post PCI.  This branch was previously shown to be severely diseased. 2.  Continued patency of the mid RCA stent with moderate distal RCA stenosis of 60 to 70% 3.  Severe stenosis of a tortuous ramus intermedius branch, unchanged from the previous procedure 4.  Severe diffuse distal circumflex stenosis and a diabetic distal diffuse pattern 5.  Moderately elevated LVEDP   Recommendations: Resume aspirin and ticagrelor to be continued without interruption for at least 12 months.  Consider long-term P2 Y12 inhibition in this diabetic patient with extensive coronary artery disease, now presenting with a second STEMI event in a different vessel.  Otherwise routine post MI medical therapy, aggressive risk reduction measures, and check inpatient 2D echocardiogram to assess degree of LV dysfunction.  Laboratory Data:  Chemistry Recent Labs  Lab 12/15/22 0310 12/19/22 0252  NA 133* 134*  K 4.6 4.1  CL 100 98  CO2 22 20*  GLUCOSE 158* 160*  BUN 29* 22  CREATININE 1.35* 1.28*  CALCIUM 8.6* 8.9  GFRNONAA 58* >60  ANIONGAP 11 16*    Recent Labs  Lab 12/13/22 0015  PROT 7.2  ALBUMIN 3.2*  AST 96*  ALT 61*  ALKPHOS 58  BILITOT 1.0   Hematology Recent Labs  Lab 12/19/22 0252  WBC 11.1*  RBC 5.35  HGB 15.7  HCT 47.5  MCV 88.8  MCH 29.3  MCHC 33.1  RDW 12.1   PLT 358   Cardiac EnzymesNo results for input(s): "TROPONINI" in the last 168 hours. No results for input(s): "TROPIPOC" in the last 168 hours.  BNP Recent Labs  Lab 12/19/22 0252  BNP 575.5*    DDimer No results for input(s): "DDIMER" in the last 168 hours.  Radiology/Studies:  No results found.  Assessment and Plan:  Garrett Roberts is a 67 yo M with pmhx HTN, HLD, DM2, Mobitz I, inferior STEMI (10/30/21) and very recent anterior STEMI (12/10/22) who presents with atrial fibrillation.  #. Atrial fibrillation #. Elevated troponin Recently hospitalized from 11/30-12/5 for anterior STEMI s/p DES to pLAD. Post-cath course c/b atrial fibrillation. Received TEE/DCCV with conversion to NSR. Started on amiodarone 200 mg BID x 7 days then 200 mg every day and eliquis 5 mg BID. Suspect elevated troponin is in the setting of atrial fibrillation and underlying CAD leading to demand ischemia, and not new plaque rupture event - continue home eliquis 5 mg BID - continue home amiodarone 200 mg BID - NPO for possible DCCV in AM. May not need TEE given compliance with apixaban - No indication to start heparin - Keep K>4, Mg >2 - continue home metoprolol UPDATE: 5:45AM - patient self-converted back to NSR. Blood pressure is normal. No chest pain or SOB. Saturating well on room air. Informed patient and both daugthters at bedside that he may be able to go home today if he stays asymptomatic and in NSR  #. CAD s/p DES to pLAD (anterior STEMI 11/2022) and DES to RCA (inferior STEMI 2023) Recent admission for anterior STEMI as above. Started on ticagrelor and asa but transitioned to plavix because he was placed on concurrent AC for atrial fibrillation. Reports compliance with medications. - continue home plavix - continue home aspirin (30 days from 11/30) - continue home atorvastatin 80 mg, zetia - continue  home metoprolol  #. Acute on chronic HFrEF 12/11/22 ECHO: LVEF 25%, no LV thrombus, moderately  reduced RV, mild MR, mid septum to apex akinesis c/w LAD infarct. BNP 500 on admission, euvolemic on presentation. - Continue GDMT: metoprolol XL 25, jardiance - Consider adding ARB/ARNI/MRA as outpatient, not started inpatient due to soft pressures  #. HLD - atorvastatin 80 mg - zetia - needs LFT/FLP in 7 weeks  #. Diabetes Hgb A1c 11.2 - continue jardiance - hold metformin while inpatient - patient refuses insulin as outpatient  #. Abdominal pain - Abdominal CT last admission showed thickened stomach folds and focal wall thickening/under distention of the proximal transverse colon. Evaluated by GI with recommendations to treat conservatively with PPI.  Defer EGD/colonoscopy for further evaluation until later given his need for dual antiplatelet therapy - continue pantoprazole    Severity of Illness: The appropriate patient status for this patient is INPATIENT. Inpatient status is judged to be reasonable and necessary in order to provide the required intensity of service to ensure the patient's safety. The patient's presenting symptoms, physical exam findings, and initial radiographic and laboratory data in the context of their chronic comorbidities is felt to place them at high risk for further clinical deterioration. Furthermore, it is not anticipated that the patient will be medically stable for discharge from the hospital within 2 midnights of admission.   * I certify that at the point of admission it is my clinical judgment that the patient will require inpatient hospital care spanning beyond 2 midnights from the point of admission due to high intensity of service, high risk for further deterioration and high frequency of surveillance required.*   For questions or updates, please contact [facility] Please consult [email or web address] for contact info under     Signed, Willette Alma, MD MPH [facility] 12/19/2022 4:37 AM

## 2022-12-26 NOTE — Progress Notes (Signed)
HEART & VASCULAR TRANSITION OF CARE CONSULT NOTE     Referring Physician: Dr. Allyson Sabal Primary Care: No PCP Primary Cardiologist: Dr. Rosemary Holms  HPI: Referred to clinic by Dr. Allyson Sabal for heart failure consultation. 67 y.o. male with history of CAD, DM II, HTN, HLD, chronic systolic CHF.   Had inferior STEMI 10/23 s/p PCI/DES to RCA. Had resdiaul diffuse disease in diagonals and OMs. EF 40-45% at time of cath. Subsequent echo with EF 55-60%.   Apparently had not been compliant with medications after losing insurance. Presented with anterior STEMI 12/10/22. Cardiac cath with 100% p LAD treated with PCI/DES. Prior RCA stent patent, severe disease in tortuous ramus and distal LCx treated medically.  Echo with EF 25%, no LV thrombus, AK mid septum to apex, moderately reduced RV. Course c/b Afib with RVR. Started on IV amiodarone and underwent DCCV to SR.  GDMT limited by soft blood pressure. Also seen by GI for abdominal pain. Had thickening of stomach on CT scan. Plan was to see GI as outpatient for consideration of endoscopy, H. Pylori later resulted positive after discharge.  Returned to ED 12/19/22 with recurrent Afib. He converted to SR while in waiting room and was discharged home.  He is here today for hospital CHF follow-up. He has been doing well. No chest pain. No dyspnea but states he hasn't pushed himself. Denies orthopnea, PND and lower extremity edema. Home weight down a couple of pounds since discharge to 161 lb. Notes he's had abdominal pain and decreased appetite off and on for months. Has lost about 15 lbs.   No ETOH or tobacco use.  Lives at home with his wife and children.  Cardiac Testing  Echo 12/11/22: IMPRESSIONS   1. Mid septum to apex is akinetic consistent with LAD infarction. No LV apical thrombus on contrast imaging. Left ventricular ejection fraction, by estimation, is 20 to 25%. The left ventricle has severely decreased function. The left ventricle   demonstrates regional wall motion abnormalities (see scoring diagram/findings for description). There is moderate concentric left  ventricular hypertrophy. Left ventricular diastolic parameters are consistent with Grade II diastolic dysfunction (pseudonormalization).   2. Right ventricular systolic function is moderately reduced. The right ventricular size is normal.   3. The mitral valve is grossly normal. Mild mitral valve regurgitation. No evidence of mitral stenosis.   4. The aortic valve is tricuspid. Aortic valve regurgitation is trivial. No aortic stenosis is present.   5. Aortic dilatation noted. There is mild dilatation of the ascending aorta, measuring 42 mm.   6. The inferior vena cava is dilated in size with >50% respiratory variability, suggesting right atrial pressure of 8 mmHg.    LHC 12/10/22: 1.  Acute anterior STEMI, secondary to total occlusion of the proximal LAD.  Primary PCI performed with a 3.5 x 38 mm Synergy DES.  Baseline stenosis 100% with TIMI 0 flow.  Post-PCI stenosis 0% with TIMI-3 flow.  The second diagonal branch is totally occluded both pre and post PCI.  This branch was previously shown to be severely diseased. 2.  Continued patency of the mid RCA stent with moderate distal RCA stenosis of 60 to 70% 3.  Severe stenosis of a tortuous ramus intermedius branch, unchanged from the previous procedure 4.  Severe diffuse distal circumflex stenosis and a diabetic distal diffuse pattern 5.  Moderately elevated LVEDP      Past Medical History:  Diagnosis Date   Coronary artery disease    Diabetes mellitus without  complication (HCC)    Hypertension     Current Outpatient Medications  Medication Sig Dispense Refill   apixaban (ELIQUIS) 5 MG TABS tablet Take 1 tablet (5 mg total) by mouth 2 (two) times daily. 60 tablet 2   Ascorbic Acid (VITAMIN C PO) Take 1 tablet by mouth daily.     Cyanocobalamin (B-12 PO) Take 1 tablet by mouth daily.     empagliflozin  (JARDIANCE) 25 MG TABS tablet Take 1 tablet (25 mg total) by mouth daily before breakfast. 90 tablet 1   MAGNESIUM PO Take 1 tablet by mouth daily.     Menaquinone-7 (K2 PO) Take 1 tablet by mouth daily.     metFORMIN (GLUCOPHAGE) 500 MG tablet Take 1 tablet (500 mg total) by mouth daily with breakfast. 30 tablet 1   Multiple Vitamin (MULTIVITAMIN PO) Take 1 tablet by mouth daily.     Multiple Vitamins-Minerals (ZINC PO) Take 1 tablet by mouth daily.     nitroGLYCERIN (NITROSTAT) 0.4 MG SL tablet Place 1 tablet (0.4 mg total) under the tongue every 5 (five) minutes x 3 doses as needed for chest pain. 25 tablet 2   pantoprazole (PROTONIX) 40 MG tablet Take 1 tablet (40 mg total) by mouth 2 (two) times daily. 60 tablet 1   sacubitril-valsartan (ENTRESTO) 24-26 MG Take 1 tablet by mouth 2 (two) times daily. 60 tablet 3   VITAMIN D PO Take 1 tablet by mouth daily.     amiodarone (PACERONE) 200 MG tablet Take 1 tablet (200 mg total) by mouth daily. 30 tablet 2   aspirin 81 MG chewable tablet Chew 1 tablet (81 mg total) by mouth daily. 30 tablet 2   atorvastatin (LIPITOR) 80 MG tablet Take 1 tablet (80 mg total) by mouth at bedtime. 30 tablet 2   clopidogrel (PLAVIX) 75 MG tablet Take 1 tablet (75 mg total) by mouth daily. 30 tablet 2   ezetimibe (ZETIA) 10 MG tablet Take 1 tablet (10 mg total) by mouth daily. 30 tablet 2   furosemide (LASIX) 20 MG tablet Take 1 tablet (20 mg total) by mouth daily as needed (weight gain (3lbs in 1 day or 5lbs in 1 week) or worsening swelling). 30 tablet 2   metoprolol succinate (TOPROL-XL) 25 MG 24 hr tablet Take 1 tablet (25 mg total) by mouth daily. 30 tablet 2   No current facility-administered medications for this encounter.    Allergies  Allergen Reactions   Beef-Derived Drug Products Other (See Comments)    Religious reasons   Pork-Derived Products     Religious reasons      Social History   Socioeconomic History   Marital status: Married    Spouse  name: Amani   Number of children: 5   Years of education: Not on file   Highest education level: High school graduate  Occupational History   Occupation: part time  Tobacco Use   Smoking status: Never   Smokeless tobacco: Not on file  Vaping Use   Vaping status: Never Used  Substance and Sexual Activity   Alcohol use: No   Drug use: No   Sexual activity: Not on file  Other Topics Concern   Not on file  Social History Narrative   ** Merged History Encounter **       Social Drivers of Health   Financial Resource Strain: High Risk (12/13/2022)   Overall Financial Resource Strain (CARDIA)    Difficulty of Paying Living Expenses: Very hard  Food Insecurity:  No Food Insecurity (12/10/2022)   Hunger Vital Sign    Worried About Running Out of Food in the Last Year: Never true    Ran Out of Food in the Last Year: Never true  Transportation Needs: No Transportation Needs (12/10/2022)   PRAPARE - Administrator, Civil Service (Medical): No    Lack of Transportation (Non-Medical): No  Physical Activity: Sufficiently Active (11/27/2019)   Received from [facility], [facility]   Exercise Vital Sign    Days of Exercise per Week: 4 days    Minutes of Exercise per Session: 60 min  Stress: Stress Concern Present (11/27/2019)   Received from [facility], [facility] - Occupational Stress Questionnaire    Feeling of Stress : To some extent  Social Connections: Unknown (05/25/2021)   Received from [facility], [facility]   Social Network    Social Network: Not on file  Intimate Partner Violence: Not At Risk (12/10/2022)   Humiliation, Afraid, Rape, and Kick questionnaire    Fear of Current or Ex-Partner: No    Emotionally Abused: No    Physically Abused: No    Sexually Abused: No      Family History  Problem Relation Age of Onset   Diabetes Mother    Chronic Renal Failure Father    Chronic Renal Failure Brother      Vitals:   12/27/22 1401  BP: 120/78  Pulse: 76  SpO2: 100%  Weight: 75.5 kg (166 lb 6.4 oz)  Height: 5\' 9"  (1.753 m)    PHYSICAL EXAM: General:  Well appearing.  HEENT: normal Neck: supple. no JVD.  Cor: PMI nondisplaced. Regular rate & rhythm. No rubs, gallops or murmurs. Lungs: clear Abdomen: soft, nontender, nondistended.  Extremities: no cyanosis, clubbing, rash, edema Neuro: alert & oriented x 3. moves all 4 extremities w/o difficulty. Affect pleasant.  ECG: SR 78 bpm, septal Qs   ASSESSMENT & PLAN: Chronic systolic CHF/ischemic cardiomyopathy: -Echo 10/23: EF 55-60% -Echo 12/24: EF 25%, no LV thrombus, AK mid septum to apex, moderately reduced RV -See below regarding CAD history -NYHA I-II. Volume looks good. Has lasix to use PRN -Continue Toprol xl 25 mg daily -Continue Jardiance 10 mg daily -Add Entresto 24/26 mg BID -Consider spiro next -Labs today, repeat BMET in 2 weeks -Repeat echo in 3 months to reassess LV function and EF. -Would benefit from cardiac rehab once he has insurance  2. CAD: -Hx inferior STEMI 10/23 s/p PCI/DES to RCA. Had resdiaul diffuse disease in diagonals and OMs -Anterior STEMI 12/10/22. Cardiac cath with 100% p LAD treated with PCI/DES. Prior RCA stent patent, severe disease in tortuous ramus and distal LCx treated medically -Denies angina -Will continue aspirin and plavix 1 month post MI then stop aspirin.  -Continue Atorvastatin 80 + Zetia 10 mg daily.   3. PAF: -SR on ECG today -Continue Eliquis 5 mg BID -Continue amiodarone 200 mg daily -TSH 6.68 on 12/09. Recheck in 4-6 weeks. Check LFTs today  4. HTN: -BP controlled today -GDMT titrated as above for heart failure  5. HLD: -Goal LDL < 55 -Recent LDL 146 -Recently restarted Atorvastatin 80 and Zetia -Consider PCSK-9 Inhibitor if not at goal on recheck  6. DM II: -Uncontrolled.  -Last A1c 11.2. Management per PCP. -Continue Jardiance  7. Abdominal  pain: -Thickening of stomach on recent CT and + H. Pylori from recent admit -He has not called GI to schedule follow-up. Will refer.  Continue protonix -If continues with abdominal discomfort/decreased appetite after treating possible GI source, may need to consider ruling out low-output HF.  Referred to HFSW (PCP, Medications, Transportation, ETOH Abuse, Drug Abuse, Insurance, Financial ): No Refer to Pharmacy: Yes, follow-up on assistance applications for Jardiance and Eliquis. Application for [employer]. Refer to Home Health: No Refer to Advanced Heart Failure Clinic: Yes Refer to General Cardiology: No, already established  Follow up  4 weeks with Dr. Shirlee Latch to establish care, will follow along with Cardiology

## 2022-12-27 ENCOUNTER — Ambulatory Visit (HOSPITAL_COMMUNITY)
Admit: 2022-12-27 | Discharge: 2022-12-27 | Disposition: A | Discharge status: Home / Self Care | Payer: MEDICAID | Attending: Physician Assistant | Admitting: Physician Assistant

## 2022-12-27 ENCOUNTER — Encounter (HOSPITAL_COMMUNITY): Payer: Self-pay

## 2022-12-27 ENCOUNTER — Other Ambulatory Visit (HOSPITAL_COMMUNITY): Payer: Self-pay

## 2022-12-27 VITALS — BP 120/78 | HR 76 | Ht 69.0 in | Wt 166.4 lb

## 2022-12-27 DIAGNOSIS — E785 Hyperlipidemia, unspecified: Secondary | ICD-10-CM | POA: Diagnosis not present

## 2022-12-27 DIAGNOSIS — I5022 Chronic systolic (congestive) heart failure: Secondary | ICD-10-CM | POA: Diagnosis not present

## 2022-12-27 DIAGNOSIS — E782 Mixed hyperlipidemia: Secondary | ICD-10-CM

## 2022-12-27 DIAGNOSIS — Z79899 Other long term (current) drug therapy: Secondary | ICD-10-CM | POA: Insufficient documentation

## 2022-12-27 DIAGNOSIS — I080 Rheumatic disorders of both mitral and aortic valves: Secondary | ICD-10-CM | POA: Diagnosis not present

## 2022-12-27 DIAGNOSIS — I10 Essential (primary) hypertension: Secondary | ICD-10-CM

## 2022-12-27 DIAGNOSIS — I252 Old myocardial infarction: Secondary | ICD-10-CM | POA: Insufficient documentation

## 2022-12-27 DIAGNOSIS — E119 Type 2 diabetes mellitus without complications: Secondary | ICD-10-CM | POA: Diagnosis not present

## 2022-12-27 DIAGNOSIS — I480 Paroxysmal atrial fibrillation: Secondary | ICD-10-CM

## 2022-12-27 DIAGNOSIS — I255 Ischemic cardiomyopathy: Secondary | ICD-10-CM

## 2022-12-27 DIAGNOSIS — Z7984 Long term (current) use of oral hypoglycemic drugs: Secondary | ICD-10-CM | POA: Diagnosis not present

## 2022-12-27 DIAGNOSIS — I2510 Atherosclerotic heart disease of native coronary artery without angina pectoris: Secondary | ICD-10-CM | POA: Diagnosis not present

## 2022-12-27 DIAGNOSIS — R109 Unspecified abdominal pain: Secondary | ICD-10-CM | POA: Insufficient documentation

## 2022-12-27 DIAGNOSIS — I110 Hypertensive heart disease with heart failure: Secondary | ICD-10-CM | POA: Insufficient documentation

## 2022-12-27 DIAGNOSIS — A048 Other specified bacterial intestinal infections: Secondary | ICD-10-CM

## 2022-12-27 DIAGNOSIS — Z7901 Long term (current) use of anticoagulants: Secondary | ICD-10-CM | POA: Diagnosis not present

## 2022-12-27 LAB — COMPREHENSIVE METABOLIC PANEL
ALT: 14 U/L (ref 0–44)
AST: 17 U/L (ref 15–41)
Albumin: 3.5 g/dL (ref 3.5–5.0)
Alkaline Phosphatase: 59 U/L (ref 38–126)
Anion gap: 13 (ref 5–15)
BUN: 24 mg/dL — ABNORMAL HIGH (ref 8–23)
CO2: 22 mmol/L (ref 22–32)
Calcium: 9.4 mg/dL (ref 8.9–10.3)
Chloride: 99 mmol/L (ref 98–111)
Creatinine, Ser: 1.24 mg/dL (ref 0.61–1.24)
GFR, Estimated: >60 mL/min (ref >60)
Glucose, Bld: 152 mg/dL — ABNORMAL HIGH (ref 70–99)
Potassium: 4.4 mmol/L (ref 3.5–5.1)
Sodium: 134 mmol/L — ABNORMAL LOW (ref 135–145)
Total Bilirubin: 0.9 mg/dL (ref <1.2)
Total Protein: 7.6 g/dL (ref 6.5–8.1)

## 2022-12-27 LAB — CBC
HCT: 46 % (ref 39.0–52.0)
Hemoglobin: 15.1 g/dL (ref 13.0–17.0)
MCH: 28.9 pg (ref 26.0–34.0)
MCHC: 32.8 g/dL (ref 30.0–36.0)
MCV: 88 fL (ref 80.0–100.0)
Platelets: 371 10*3/uL (ref 150–400)
RBC: 5.23 MIL/uL (ref 4.22–5.81)
RDW: 12.3 % (ref 11.5–15.5)
WBC: 7.6 10*3/uL (ref 4.0–10.5)
nRBC: 0 % (ref 0.0–0.2)

## 2022-12-27 LAB — BRAIN NATRIURETIC PEPTIDE: B Natriuretic Peptide: 349.9 pg/mL — ABNORMAL HIGH (ref 0.0–100.0)

## 2022-12-27 MED ORDER — CLOPIDOGREL BISULFATE 75 MG PO TABS
75.0000 mg | ORAL_TABLET | Freq: Every day | ORAL | 2 refills | Status: DC
  Filled 2022-12-27 – 2023-01-06 (×2): qty 30, 30d supply, fill #0
  Filled 2023-02-06: qty 30, 30d supply, fill #1
  Filled 2023-03-10: qty 30, 30d supply, fill #2

## 2022-12-27 MED ORDER — FUROSEMIDE 20 MG PO TABS
20.0000 mg | ORAL_TABLET | Freq: Every day | ORAL | 2 refills | Status: DC | PRN
  Filled 2022-12-27: qty 30, 30d supply, fill #0

## 2022-12-27 MED ORDER — ATORVASTATIN CALCIUM 80 MG PO TABS
80.0000 mg | ORAL_TABLET | Freq: Every day | ORAL | 2 refills | Status: DC
  Filled 2022-12-27 – 2023-01-06 (×2): qty 30, 30d supply, fill #0
  Filled 2023-02-06: qty 30, 30d supply, fill #1

## 2022-12-27 MED ORDER — ENTRESTO 24-26 MG PO TABS: 1.0000 | ORAL_TABLET | Freq: Two times a day (BID) | ORAL | 3 refills | Status: DC

## 2022-12-27 MED ORDER — EZETIMIBE 10 MG PO TABS
10.0000 mg | ORAL_TABLET | Freq: Every day | ORAL | 2 refills | Status: DC
  Filled 2022-12-27 – 2023-01-06 (×2): qty 30, 30d supply, fill #0
  Filled 2023-02-06: qty 30, 30d supply, fill #1
  Filled 2023-03-10: qty 30, 30d supply, fill #2

## 2022-12-27 MED ORDER — AMIODARONE HCL 200 MG PO TABS
200.0000 mg | ORAL_TABLET | Freq: Every day | ORAL | 2 refills | Status: DC
  Filled 2022-12-27 – 2023-01-06 (×2): qty 30, 30d supply, fill #0

## 2022-12-27 MED ORDER — METOPROLOL SUCCINATE ER 25 MG PO TB24
25.0000 mg | ORAL_TABLET | Freq: Every day | ORAL | 2 refills | Status: DC
  Filled 2022-12-27 – 2023-01-06 (×2): qty 30, 30d supply, fill #0
  Filled 2023-02-06: qty 30, 30d supply, fill #1

## 2022-12-27 MED ORDER — ASPIRIN 81 MG PO CHEW
81.0000 mg | CHEWABLE_TABLET | Freq: Every day | ORAL | 2 refills | Status: DC
  Filled 2022-12-27 – 2023-01-06 (×2): qty 30, 30d supply, fill #0

## 2022-12-27 NOTE — Patient Instructions (Signed)
Medication Changes:  START Entresto 24/26 mg Twice daily, we have provided you with a Free Trial Offer Card and sent in an application for patient assistance  Lab Work:  Labs done today, your results will be available in MyChart, we will contact you for abnormal readings.   Testing/Procedures:  none  Referrals:  You have been referred to [facility], they will call you for an appointment  Special Instructions // Education:  Do the following things EVERYDAY: Weigh yourself in the morning before breakfast. Write it down and keep it in a log. Take your medicines as prescribed Eat low salt foods--Limit salt (sodium) to 2000 mg per day.  Stay as active as you can everyday Limit all fluids for the day to less than 2 liters   Follow-Up in: Thank you for allowing [place] to provider your heart failure care after your recent hospitalization. Please follow-up with [place] again in 2-3 weeks and in our Advanced Heart Failure Clinic (Dr Shirlee Latch) in Jan   At the Advanced Heart Failure Clinic, you and your health needs are our priority. We have a designated team specialized in the treatment of Heart Failure. This Care Team includes your primary Heart Failure Specialized Cardiologist (physician), Advanced Practice Providers (APPs- Physician Assistants and Nurse Practitioners), and Pharmacist who all work together to provide you with the care you need, when you need it.   You may see any of the following providers on your designated Care Team at your next follow up:  Dr. Arvilla Meres Dr. Marca Ancona Dr. Dorthula Nettles Dr. Theresia Bough Tonye Becket, NP Robbie Lis, [place] [facility] [place], [place] Brynda Peon, NP [place] Lee, NP Karle Plumber, PharmD   Please be sure to bring in all your medications bottles to every appointment.   Need to Contact us:  If you have any questions or concerns before your next appointment please send [place] a message through [place] or call our office at  [phone].    TO LEAVE A MESSAGE FOR THE NURSE SELECT OPTION 2, PLEASE LEAVE A MESSAGE INCLUDING: YOUR NAME DATE OF BIRTH CALL BACK NUMBER REASON FOR CALL**this is important as we prioritize the call backs  YOU WILL RECEIVE A CALL BACK THE SAME DAY AS LONG AS YOU CALL BEFORE 4:00 PM

## 2022-12-29 ENCOUNTER — Telehealth (HOSPITAL_COMMUNITY): Payer: Self-pay

## 2022-12-29 NOTE — Telephone Encounter (Signed)
Pt staying on with AHF. Applications faxed in separate encounters. Will continue to follow up.

## 2022-12-29 NOTE — Telephone Encounter (Signed)
Advanced Heart Failure Patient Advocate Encounter  Application for Entresto faxed to [employer] on 12/29/2022. Application form attached to patient chart.  Burnell Blanks, CPhT Rx Patient Advocate Phone: [phone]

## 2022-12-29 NOTE — Telephone Encounter (Signed)
Advanced Heart Failure Patient Advocate Encounter  Application for Eliquis faxed to [employer] on 12/29/2022. Application form attached to patient chart.  Burnell Blanks, CPhT Rx Patient Advocate Phone: [phone]

## 2022-12-29 NOTE — Telephone Encounter (Signed)
Advanced Heart Failure Patient Advocate Encounter  Application for Jardiance faxed to [facility] on 12/29/2022. Application form attached to patient chart.  Burnell Blanks, CPhT Rx Patient Advocate Phone: [phone]

## 2023-01-02 ENCOUNTER — Telehealth: Payer: Self-pay | Admitting: Gastroenterology

## 2023-01-02 NOTE — Telephone Encounter (Signed)
[employer] is requesting proof of income before making a determination. Left voicemail for patient.

## 2023-01-06 ENCOUNTER — Other Ambulatory Visit (HOSPITAL_COMMUNITY): Payer: Self-pay

## 2023-01-06 NOTE — Telephone Encounter (Signed)
Patient was requesting a hospital follow up appointment. He was scheduled with Dr Leonides Schanz on 01/13/23 at 910 am. He verbalizes understanding.

## 2023-01-06 NOTE — Telephone Encounter (Signed)
Left message for patient to call back  

## 2023-01-09 ENCOUNTER — Other Ambulatory Visit (HOSPITAL_COMMUNITY): Payer: Self-pay

## 2023-01-09 ENCOUNTER — Telehealth: Payer: Self-pay | Admitting: Cardiology

## 2023-01-09 NOTE — Telephone Encounter (Signed)
Pt c/o medication issue:  1. Name of Medication:   empagliflozin (JARDIANCE) 25 MG TABS tablet  apixaban (ELIQUIS) 5 MG TABS tablet   2. How are you currently taking this medication (dosage and times per day)?   3. Are you having a reaction (difficulty breathing--STAT)?   4. What is your medication issue? Patient is requesting call back to see what the options would be for these medications due to insurance not covering. Requesting call back.

## 2023-01-09 NOTE — Telephone Encounter (Signed)
Spoke with patient and he states he is unable to afford Jardiance 25 mg and Eliquis 5mg . I see he has applied for assistance and he is awaiting for his medicare are we able to give him samples

## 2023-01-12 ENCOUNTER — Ambulatory Visit (HOSPITAL_COMMUNITY): Payer: Self-pay

## 2023-01-13 ENCOUNTER — Ambulatory Visit: Payer: Self-pay | Admitting: Internal Medicine

## 2023-01-13 ENCOUNTER — Encounter: Payer: Self-pay | Admitting: Internal Medicine

## 2023-01-13 ENCOUNTER — Other Ambulatory Visit (HOSPITAL_COMMUNITY): Payer: Self-pay

## 2023-01-13 VITALS — BP 90/70 | HR 76 | Ht 69.0 in | Wt 172.5 lb

## 2023-01-13 DIAGNOSIS — R1013 Epigastric pain: Secondary | ICD-10-CM

## 2023-01-13 DIAGNOSIS — A048 Other specified bacterial intestinal infections: Secondary | ICD-10-CM

## 2023-01-13 DIAGNOSIS — R1032 Left lower quadrant pain: Secondary | ICD-10-CM

## 2023-01-13 DIAGNOSIS — B9681 Helicobacter pylori [H. pylori] as the cause of diseases classified elsewhere: Secondary | ICD-10-CM

## 2023-01-13 DIAGNOSIS — K5900 Constipation, unspecified: Secondary | ICD-10-CM

## 2023-01-13 DIAGNOSIS — R143 Flatulence: Secondary | ICD-10-CM

## 2023-01-13 MED ORDER — BISMUTH SUBSALICYLATE 262 MG PO CHEW: 524.0000 mg | CHEWABLE_TABLET | Freq: Four times a day (QID) | ORAL | Status: AC

## 2023-01-13 MED ORDER — TETRACYCLINE HCL 250 MG PO CAPS: 500.0000 mg | ORAL_CAPSULE | Freq: Four times a day (QID) | ORAL | Status: AC

## 2023-01-13 MED ORDER — PANTOPRAZOLE SODIUM 40 MG PO TBEC
40.0000 mg | DELAYED_RELEASE_TABLET | Freq: Two times a day (BID) | ORAL | 1 refills | Status: DC
  Filled 2023-01-13 – 2023-01-16 (×2): qty 60, 30d supply, fill #0

## 2023-01-13 MED ORDER — METRONIDAZOLE 500 MG PO TABS
500.0000 mg | ORAL_TABLET | Freq: Four times a day (QID) | ORAL | 0 refills | Status: AC
  Filled 2023-01-13: qty 56, 14d supply, fill #0

## 2023-01-13 NOTE — Progress Notes (Signed)
 Chief Complaint: abdominal pain Primary GI Doctor: Dr. Federico  HPI: Patient is a 68 year old male patient that presents with past medical history of anterior STEMI (12/10/22) s/p DES and inferior STEMI (10/30/21) s/p DES, DM2, Mobitz 1 , who was referred to me by Manuelita Dutch, PA-C on 12/27/2022 for a complaint of abdominal pain, accompanied by his daughter.  On 12/10/2022 patient presented presented with chest pain, found to have a STEMI status post PCI on Brilinta  and new HFrEF with EF 20 to 25%. We are consulted for abdominal pain and a finding of thickened stomach folds and focal wall thickening/under distention of the proximal transverse colon on CT scan. Patient has had abdominal pain for years.  He was seen by Dr. Leigh per his note He is not on any PPI at home and denies NSAID use. At this time we will try to help treat his abdominal pain conservatively with PPI twice daily. Will also increase his bowel regimen to help with any underlying constipation. Due to his recent STEMI, I would favor waiting on performing EGD/colonoscopy for further evaluation as an outpatient when it would be safe to hold his Brilinta  and to allow some time for his heart failure to improve.   On 12/19/2022 patient presented to [facility] ED for evaluation of regular heartbeat with chest discomfort.Patient presented on November 30 with STEMI and was found to have an LAD lesion that was stented. He did have postprocedure atrial fibrillation and had cardioversion performed prior to discharge.  EKG on admission shows atrial fibrillation with evolving infarct in anteroseptal distribution (with q-waves) consistent with recent prior anterior STEMI. CMP is wnl (Cr 1.28 at baseline), but troponin elevated to 4,444 in the setting of recent anterior STEMI (trop >24,000 on 11/30) with instrumentation.   Interval History   Patient presents for follow-up after recent hospitalizations. He reports since he started taking the  Protonix  40 mg BID the epigastric pain he was experiencing in the LUQ has improved. Denies having or history of GERD or dysphagia. He has lost 12-14lbs since November and cardiac issues. His appetite is slowly improving. He reports no blood in stools. No dark tarry stools. We discussed his positive h pylori results. Patients daughter reports her sister was diagnosed and treated for H. Pylori.   His second complaint is of LLQ pain that is relieved with having a bowel movement or lying on his left side which helps relieve excess gas. He was on Miralax  po in the hospital, but reports he has not been taking it at home. He has bowel movement every 2-3 days. Sometimes large amount of stool ,sometimes small amount. He has Gas/Bloat with certain foods. Denies alcohol or smoking. He reports his last colonoscopy was 6 or 7 years ago somewhere here in [place]. Recalls it being normal. Never had EGD or been seen by gastroenterologist. Patient states no family history of CA.   Wt Readings from Last 3 Encounters:  01/13/23 172 lb 8 oz (78.2 kg)  12/27/22 166 lb 6.4 oz (75.5 kg)  12/19/22 173 lb 8 oz (78.7 kg)    Past Medical History:  Diagnosis Date   Coronary artery disease    Diabetes mellitus without complication (HCC)    Hypertension    Past Surgical History:  Procedure Laterality Date   CARDIOVERSION N/A 12/14/2022   Procedure: CARDIOVERSION;  Surgeon: Kate Lonni CROME, MD;  Location: [facility] INVASIVE CV LAB;  Service: Cardiovascular;  Laterality: N/A;   CORONARY/GRAFT ACUTE MI REVASCULARIZATION N/A 10/30/2021  Procedure: Coronary/Graft Acute MI Revascularization;  Surgeon: Ladona Heinz, MD;  Location: [facility] INVASIVE CV LAB;  Service: Cardiovascular;  Laterality: N/A;   CORONARY/GRAFT ACUTE MI REVASCULARIZATION N/A 12/10/2022   Procedure: Coronary/Graft Acute MI Revascularization;  Surgeon: Wonda Sharper, MD;  Location: [facility] INVASIVE CV LAB;  Service: Cardiovascular;  Laterality: N/A;   LEFT HEART CATH  AND CORONARY ANGIOGRAPHY N/A 10/30/2021   Procedure: LEFT HEART CATH AND CORONARY ANGIOGRAPHY;  Surgeon: Ladona Heinz, MD;  Location: [facility] INVASIVE CV LAB;  Service: Cardiovascular;  Laterality: N/A;   LEFT HEART CATH AND CORONARY ANGIOGRAPHY N/A 12/10/2022   Procedure: LEFT HEART CATH AND CORONARY ANGIOGRAPHY;  Surgeon: Wonda Sharper, MD;  Location: [facility] INVASIVE CV LAB;  Service: Cardiovascular;  Laterality: N/A;   Current Outpatient Medications  Medication Sig Dispense Refill   amiodarone  (PACERONE ) 200 MG tablet Take 1 tablet (200 mg total) by mouth daily. 30 tablet 2   apixaban  (ELIQUIS ) 5 MG TABS tablet Take 1 tablet (5 mg total) by mouth 2 (two) times daily. 60 tablet 2   Ascorbic Acid (VITAMIN C PO) Take 1 tablet by mouth daily.     aspirin  81 MG chewable tablet Chew 1 tablet (81 mg total) by mouth daily. 30 tablet 2   atorvastatin  (LIPITOR ) 80 MG tablet Take 1 tablet (80 mg total) by mouth at bedtime. 30 tablet 2   clopidogrel  (PLAVIX ) 75 MG tablet Take 1 tablet (75 mg total) by mouth daily. 30 tablet 2   Cyanocobalamin  (B-12 PO) Take 1 tablet by mouth daily.     empagliflozin  (JARDIANCE ) 25 MG TABS tablet Take 1 tablet (25 mg total) by mouth daily before breakfast. 90 tablet 1   ezetimibe  (ZETIA ) 10 MG tablet Take 1 tablet (10 mg total) by mouth daily. 30 tablet 2   furosemide  (LASIX ) 20 MG tablet Take 1 tablet (20 mg total) by mouth daily as needed (weight gain (3lbs in 1 day or 5lbs in 1 week) or worsening swelling). 30 tablet 2   MAGNESIUM PO Take 1 tablet by mouth daily.     Menaquinone-7 (K2 PO) Take 1 tablet by mouth daily.     metFORMIN  (GLUCOPHAGE ) 500 MG tablet Take 1 tablet (500 mg total) by mouth daily with breakfast. 30 tablet 1   metoprolol  succinate (TOPROL -XL) 25 MG 24 hr tablet Take 1 tablet (25 mg total) by mouth daily. 30 tablet 2   Multiple Vitamin (MULTIVITAMIN PO) Take 1 tablet by mouth daily.     Multiple Vitamins-Minerals (ZINC PO) Take 1 tablet by mouth daily.      nitroGLYCERIN  (NITROSTAT ) 0.4 MG SL tablet Place 1 tablet (0.4 mg total) under the tongue every 5 (five) minutes x 3 doses as needed for chest pain. 25 tablet 2   pantoprazole  (PROTONIX ) 40 MG tablet Take 1 tablet (40 mg total) by mouth 2 (two) times daily. 60 tablet 1   sacubitril-valsartan (ENTRESTO ) 24-26 MG Take 1 tablet by mouth 2 (two) times daily. 60 tablet 3   VITAMIN D PO Take 1 tablet by mouth daily.     No current facility-administered medications for this visit.   Allergies as of 01/13/2023 - Review Complete 01/13/2023  Allergen Reaction Noted   Beef-derived drug products Other (See Comments) 12/15/2022   Pork-derived products  12/15/2022   Family History  Problem Relation Age of Onset   Diabetes Mother    Chronic Renal Failure Father    Chronic Renal Failure Brother    Review of Systems:    Constitutional: No  weight loss, fever, chills, weakness or fatigue HEENT: Eyes: No change in vision               Ears, Nose, Throat:  No change in hearing or congestion Skin: No rash or itching Cardiovascular: No chest pain, chest pressure or palpitations   Respiratory: No SOB or cough Gastrointestinal: See HPI and otherwise negative Genitourinary: No dysuria or change in urinary frequency Neurological: No headache, dizziness or syncope Musculoskeletal: No new muscle or joint pain Hematologic: No bleeding or bruising Psychiatric: No history of depression or anxiety   Physical Exam:  Vital signs: Ht 5' 9 (1.753 m) Comment: height measured without shoes  Wt 172 lb 8 oz (78.2 kg)   BMI 25.47 kg/m   Constitutional: Pleasant African American male appears to be in NAD, Well developed, Well nourished, alert and cooperative Neck:  Supple Throat: Oral cavity and pharynx without inflammation, swelling or lesion.  Respiratory: Respirations even and unlabored. Lungs clear to auscultation bilaterally.   No wheezes, crackles, or rhonchi.  Cardiovascular: Normal S1, S2. Regular rate and  rhythm. No peripheral edema, cyanosis or pallor.  Gastrointestinal: Soft, nondistended, nontender. No rebound or guarding. Normal bowel sounds. No appreciable masses or hepatomegaly. Rectal: Not performed.  Skin: Dry and intact without significant lesions or rashes. Psychiatric: Oriented to person, place and time. Demonstrates good judgement and reason without abnormal affect or behaviors.  RELEVANT LABS AND IMAGING: CBC    Component Value Date/Time   WBC 7.6 12/27/2022 1526   RBC 5.23 12/27/2022 1526   HGB 15.1 12/27/2022 1526   HCT 46.0 12/27/2022 1526   PLT 371 12/27/2022 1526   MCV 88.0 12/27/2022 1526   MCH 28.9 12/27/2022 1526   MCHC 32.8 12/27/2022 1526   RDW 12.3 12/27/2022 1526   LYMPHSABS 3.0 04/14/2009 2241   MONOABS 0.7 04/14/2009 2241   EOSABS 0.3 04/14/2009 2241   BASOSABS 0.1 04/14/2009 2241   CMP     Component Value Date/Time   NA 134 (L) 12/27/2022 1526   K 4.4 12/27/2022 1526   CL 99 12/27/2022 1526   CO2 22 12/27/2022 1526   GLUCOSE 152 (H) 12/27/2022 1526   BUN 24 (H) 12/27/2022 1526   CREATININE 1.24 12/27/2022 1526   CALCIUM  9.4 12/27/2022 1526   PROT 7.6 12/27/2022 1526   ALBUMIN  3.5 12/27/2022 1526   AST 17 12/27/2022 1526   ALT 14 12/27/2022 1526   ALKPHOS 59 12/27/2022 1526   BILITOT 0.9 12/27/2022 1526   GFRNONAA >60 12/27/2022 1526   GFRAA >90 09/25/2012 1823   Lab Results  Component Value Date   TSH 6.680 (H) 12/19/2022    12/12/2022 H. pylori antigen stool test positive 12/11/2022 echo-Mid septum to apex is akinetic consistent with LAD infarction. No LV  apical thrombus on contrast imaging. Left ventricular ejection fraction,  by estimation, is 20 to 25%. The left ventricle has severely decreased function.  12/10/2022 CT abd/pelvis with contrast IMPRESSION: 1. Moderate thickened folds in the lateral proximal to mid stomach consistent with gastritis. This has increased since 2011. Endoscopy Akasia Ahmad be indicated but no overt masslike wall  thickening is seen. 2. Focal wall thickening or underdistention of the proximal transverse colon. Consider either a short interval follow-up study to see if this persists, BE, or colonoscopy follow-up unless recently done. 3. Cardiomegaly with trace pleural effusions and interstitial edema in the lung bases. 4. Scattered ground-glass opacities in the lung bases probably ground-glass edema, less likely ground-glass pneumonitis. 5. Aortic and coronary  artery atherosclerosis. 6. Moderate retained stool in the ascending colon.  Assessment: Encounter Diagnoses  Name Primary?   Helicobacter pylori (H. pylori) infection Yes   Abdominal pain, epigastric    LLQ abdominal pain    Constipation, unspecified constipation type    Flatulence   68 year old male patient that presents after recent hospitalization. His epigastric pain has improved with the PPI therapy twice daily and the abnormal moderate thickened folds in the lateral proximal to mid stomach consistent with gastritis on CT scan most likely due to the untreated H. Pylori infection. We will go ahead and treat with Quad therapy and reevaluate at follow-up for eradication. We will plan for EGD in future. For the LLQ pain relieved with defecating most likely due to constipation along with the gas and bloat. I will have the patient start daily Miralax  to regulate his bowels to see if this improves the problem. We will also plan for colonoscopy in near future.  Plan: -Treat H.pylori with bismuth  quadruple therapy -Recommend Gas X prn -Recommend Miralax  po daily, titrate as needed -Will defer EGD/colon for 6 months after his STEMI in 11/2022. Hopefully his CHF will have improved by that time. -Follow-up in 3 months with Dr. Federico or Cathryne CARROLYN Cathryne Jaques Mineer, FNP-C [facility] 01/13/2023, 8:30 AM  I have seen the patient with Ms. Dreonna Hussein and she has served as a neurosurgeon.  Estefana Federico, MD  I spent 41 minutes of time, including in depth  chart review, independent review of results as outlined above, communicating results with the patient directly, face-to-face time with the patient, coordinating care, ordering studies and medications as appropriate, and documentation.

## 2023-01-13 NOTE — Patient Instructions (Addendum)
 Start taking Miralax  daily use Gas-X as needed  Follow up in 3 months If your blood pressure at your visit was 140/90 or greater, please contact your primary care physician to follow up on this.  _______________________________________________________  If you are age 68 or older, your body mass index should be between 23-30. Your Body mass index is 25.47 kg/m. If this is out of the aforementioned range listed, please consider follow up with your Primary Care Provider.  If you are age 54 or younger, your body mass index should be between 19-25. Your Body mass index is 25.47 kg/m. If this is out of the aformentioned range listed, please consider follow up with your Primary Care Provider.   ________________________________________________________  The [facility] providers would like to encourage you to use MYCHART to communicate with providers for non-urgent requests or questions.  Due to long hold times on the telephone, sending your provider a message by [facility] may be a faster and more efficient way to get a response.  Please allow 48 business hours for a response.  Please remember that this is for non-urgent requests.  _______________________________________________________  Thank you for entrusting me with your care and for choosing [facility],  Dr. Estefana Kidney  .lec

## 2023-01-13 NOTE — Telephone Encounter (Addendum)
 Spoke with patient and he is aware we are out of samples of Eliquis and Jardiance  [facility] is out as well

## 2023-01-15 NOTE — Progress Notes (Signed)
 Cardiology Office Note    Patient Name: Garrett Roberts Date of Encounter: 01/16/2023  Primary Care Provider:  Pcp, No Primary Cardiologist:  Garrett JINNY Lawrence, MD Primary Electrophysiologist: None   Past Medical History    Past Medical History:  Diagnosis Date   Atrial fibrillation (HCC)    CHF (congestive heart failure) (HCC)    Coronary artery disease    Diabetes mellitus without complication (HCC)    DM (diabetes mellitus) (HCC)    H. pylori infection    Heart attack (HCC)    HLD (hyperlipidemia)    Hypertension     History of Present Illness  Garrett Roberts is a 68 y.o. male with a PMH of CAD s/p inferior STEMI 10/2021 treated with DES to RCA and recent anterior STEMI on 12/10/2022 s/p DES to LAD , HFrEF, paroxysmal AF, DM type II, ICM, CKD stage II, HTN, HLD who presents today for post PCI follow-up.  Garrett Roberts was seen initially in 2010 by Dr. Rolan for complaint of chest pain.  He underwent an ETT/Myoview that showed no evidence of ischemia.  He presented to the ED on 10/30/2021 with sudden onset of epigastric tightness and chest pain.  He was diagnosed with an inferior STEMI and underwent LHC and treated with PCI/DES to RCA.  2D echo was completed showing EF of 55 to 60% with no significant valve disease.  He was placed on ASA and Brilinta  continue to be followed by [facility] cardiology.  He was reported to be noncompliant with medication therapy.  He presented to the ED on 12/10/2022 with complaint of chest pain.  He developed chest discomfort and abdominal discomfort together and noted pain in left upper chest and left lower abdominal quadrant with troponins elevated at 137 and EKG demonstrating significant elevation in anterior leads consistent with STEMI.  He underwent emergent LHC with total occlusion of proximal LAD treated with PCI/DES x 1 and moderate distal RCA stenosis 60 to 70% with severe stenosis of torturous ramus intermedius and severe diffuse  distal circumflex stenosis with moderately elevated LVEDP.  Due to history of noncompliance he was switched from Brilinta  to Plavix  ASA 81 mg.  He underwent 2D echo that showed reduced EF of 25% and BP was too low at that time for titration of GDMT.  He was currently on Jardiance  metoprolol  succinate with plan to add ARB once hemodynamic stabilization.  He also developed AF with RVR and was placed on amiodarone  along with Eliquis  with no conversion to sinus rhythm.  He underwent TEE/DCCV with conversion to sinus rhythm and was discharged on 12/15/2022 with p.o. amiodarone  and Eliquis  continued.  He was evaluated by gastroenterology with plan to pursue future EGD/colonoscopy.  Garrett Roberts was seen today for posthospital follow-up. Since discharge, the patient reports experiencing shortness of breath, particularly when lying flat, and has noticed swelling in the feet. The patient also reports feeling very tired and fatigued, which he attributes to the medications. The patient is struggling with the cost of medications and has run out of Eliquis , a blood thinner prescribed for atrial fibrillation.  His blood pressure today was controlled at 100/60 and heart rate was 79 bpm.  He endorses no episodes of tachycardia or palpitations since hospital discharge.  The patient is compliant with the rest of the prescribed medications and is making efforts to follow a heart-healthy diet and monitor his weight.Patient denies chest pain, palpitations,  PND, orthopnea, nausea, vomiting, dizziness, syncope, edema, weight gain, or early satiety.   Review  of Systems  Please see the history of present illness.    All other systems reviewed and are otherwise negative except as noted above.  Physical Exam    Wt Readings from Last 3 Encounters:  01/16/23 171 lb (77.6 kg)  01/13/23 172 lb 8 oz (78.2 kg)  12/27/22 166 lb 6.4 oz (75.5 kg)   VS: Vitals:   01/16/23 1353  BP: 100/60  Pulse: 79  SpO2: 98%  ,Body mass index  is 23.19 kg/m. GEN: Well nourished, well developed in no acute distress Neck: No JVD; No carotid bruits Pulmonary: Clear to auscultation without rales, wheezing or rhonchi  Cardiovascular: Normal rate. Regular rhythm. Normal S1. Normal S2.  Right radial site clean dry and intact with no evidence of hematoma or ecchymosis Murmurs: There is no murmur.  ABDOMEN: Soft, non-tender, non-distended EXTREMITIES: +1 bilateral edema to tops of feet  EKG/LABS/ Recent Cardiac Studies   ECG personally reviewed by me today -included today  Risk Assessment/Calculations:    CHA2DS2-VASc Score = 5   This indicates a 7.2% annual risk of stroke. The patient's score is based upon: CHF History: 1 HTN History: 1 Diabetes History: 1 Stroke History: 0 Vascular Disease History: 1 Age Score: 1 Gender Score: 0         Lab Results  Component Value Date   WBC 7.6 12/27/2022   HGB 15.1 12/27/2022   HCT 46.0 12/27/2022   MCV 88.0 12/27/2022   PLT 371 12/27/2022   Lab Results  Component Value Date   CREATININE 1.24 12/27/2022   BUN 24 (H) 12/27/2022   NA 134 (L) 12/27/2022   K 4.4 12/27/2022   CL 99 12/27/2022   CO2 22 12/27/2022   Lab Results  Component Value Date   CHOL 212 (H) 12/10/2022   HDL 43 12/10/2022   LDLCALC 146 (H) 12/10/2022   TRIG 113 12/10/2022   CHOLHDL 4.9 12/10/2022    Lab Results  Component Value Date   HGBA1C 11.2 (H) 12/10/2022   Assessment & Plan    1.  Coronary artery disease: -s/p previous inferior STEMI 2023 with anterior STEMI in the setting of medication noncompliance treated with PCI/DES x 1 to total occlusion of proximal LAD and continued patency of mid RCA stent and severe torturous ramus branch and severe diffuse distal left circumflex stenosis treated medically. -Today patient reports no chest pain but does endorse shortness of breath that occurs most prominently with lying down. -Continue current GDMT with Plavix  75 mg daily, Lipitor  80 mg daily  ezetimibe  10 mg daily, metoprolol  25 mg daily, ASA 81 mg -He is interested in completing cardiac rehab and will be provided clearance today.  2.  HFrEF/ICM: -2D echo completed showing EF of 25% with no LV thrombus and moderate reduced RV with mild MR and septum to apex akinesis consistent with LAD infarct -Unable to titrate GDMT in the setting of low BP -Continue Toprol  XL 25 mg and Jardiance  with patient assistance information and samples provided today -Patient reports shortness of breath with lying flat and appears to be euvolemic on exam with exception of trace edema in feet. -Continue Entresto  24/26 mg twice daily Lasix  20 mg as needed -He is scheduled to follow-up in advanced heart failure clinic in 2 weeks -Low sodium diet, fluid restriction <2L, and daily weights encouraged. Educated to contact our office for weight gain of 2 lbs overnight or 5 lbs in one week.   3.  Paroxysmal AF: -Converted with TEE/DCCV and currently on  amiodarone  with complaint of shortness of breath -We will discontinue amiodarone  20 mg today -He will continue Toprol -XL 25 mg daily -Order 14-day cardiac monitor to assess for atrial fibrillation. -Continue Eliquis  5 mg twice daily -CHA2DS2-VASc Score = 5 [CHF History: 1, HTN History: 1, Diabetes History: 1, Stroke History: 0, Vascular Disease History: 1, Age Score: 1, Gender Score: 0].  Therefore, the patient's annual risk of stroke is 7.2 %.      4.  Hyperlipidemia: -Patient will have LFTs and lipids checked in 4 weeks -Continue Lipitor  80 mg daily and ezetimibe  10 mg daily  5.  DM type II: -Patient's hemoglobin A1c was 11.2 -Continue Jardiance  25 mg daily metformin  500 mg daily  6.  Essential hypertension: -Patient's blood pressure today was stable at 100/60 -Continue Toprol -XL 25 mg daily, Entresto  24/26 mg twice daily  7.  Abdominal pain: -Patient currently not experiencing any discomfort and is scheduled to follow-up with gastroenterology later this  week.    Cardiac Rehabilitation Eligibility Assessment  The patient is ready to start cardiac rehabilitation from a cardiac standpoint.     Disposition: Follow-up with Garrett JINNY Lawrence, MD or APP in 2 months    Signed, Wyn Raddle, Jackee Shove, NP 01/16/2023, 5:24 PM [facility]

## 2023-01-16 ENCOUNTER — Other Ambulatory Visit: Payer: Self-pay

## 2023-01-16 ENCOUNTER — Other Ambulatory Visit (HOSPITAL_COMMUNITY): Payer: Self-pay

## 2023-01-16 ENCOUNTER — Ambulatory Visit: Payer: MEDICAID | Attending: Nurse Practitioner | Admitting: Nurse Practitioner

## 2023-01-16 ENCOUNTER — Other Ambulatory Visit: Payer: Self-pay | Admitting: *Deleted

## 2023-01-16 ENCOUNTER — Encounter: Payer: Self-pay | Admitting: Nurse Practitioner

## 2023-01-16 ENCOUNTER — Ambulatory Visit (INDEPENDENT_AMBULATORY_CARE_PROVIDER_SITE_OTHER): Payer: MEDICAID

## 2023-01-16 VITALS — BP 100/60 | HR 79 | Ht 72.0 in | Wt 171.0 lb

## 2023-01-16 DIAGNOSIS — I2510 Atherosclerotic heart disease of native coronary artery without angina pectoris: Secondary | ICD-10-CM

## 2023-01-16 DIAGNOSIS — I5022 Chronic systolic (congestive) heart failure: Secondary | ICD-10-CM

## 2023-01-16 DIAGNOSIS — E782 Mixed hyperlipidemia: Secondary | ICD-10-CM

## 2023-01-16 DIAGNOSIS — I10 Essential (primary) hypertension: Secondary | ICD-10-CM

## 2023-01-16 DIAGNOSIS — I255 Ischemic cardiomyopathy: Secondary | ICD-10-CM

## 2023-01-16 DIAGNOSIS — I480 Paroxysmal atrial fibrillation: Secondary | ICD-10-CM

## 2023-01-16 DIAGNOSIS — R1010 Upper abdominal pain, unspecified: Secondary | ICD-10-CM

## 2023-01-16 MED ORDER — APIXABAN 5 MG PO TABS: 5.0000 mg | ORAL_TABLET | Freq: Two times a day (BID) | ORAL | Status: DC

## 2023-01-16 MED ORDER — EMPAGLIFLOZIN 10 MG PO TABS: 10.0000 mg | ORAL_TABLET | Freq: Every day | ORAL | Status: DC

## 2023-01-16 NOTE — Progress Notes (Unsigned)
Enrolled for [employer] to mail a ZIO XT long term holter monitor to the patients address on file.   Dr. Rosemary Holms to read.

## 2023-01-16 NOTE — Patient Instructions (Signed)
 Medication Instructions:  Your physician has recommended you make the following change in your medication:  STOP Amiodarone   *If you need a refill on your cardiac medications before your next appointment, please call your pharmacy*   Lab Work: None today, come back fasting when you see Dr. Elmira in March, we will check your cholesterol at that time.  If you have labs (blood work) drawn today and your tests are completely normal, you will receive your results only by: MyChart Message (if you have MyChart) OR A paper copy in the mail If you have any lab test that is abnormal or we need to change your treatment, we will call you to review the results.   Testing/Procedures: GEOFFRY HEWS- Long Term Monitor Instructions  Your physician has requested you wear a ZIO patch monitor for 14 days.  This is a single patch monitor. [employer] supplies one patch monitor per enrollment. Additional stickers are not available. Please do not apply patch if you will be having a Nuclear Stress Test,  Echocardiogram, Cardiac CT, MRI, or Chest Xray during the period you would be wearing the  monitor. The patch cannot be worn during these tests. You cannot remove and re-apply the  ZIO XT patch monitor.  Your ZIO patch monitor will be mailed 3 day USPS to your address on file. It may take 3-5 days  to receive your monitor after you have been enrolled.  Once you have received your monitor, please review the enclosed instructions. Your monitor  has already been registered assigning a specific monitor serial # to you.  Billing and Patient Assistance Program Information  We have supplied [employer] with any of your insurance information on file for billing purposes. [employer] offers a sliding scale Patient Assistance Program for patients that do not have  insurance, or whose insurance does not completely cover the cost of the ZIO monitor.  You must apply for the Patient Assistance Program to qualify for this discounted  rate.  To apply, please call [employer] at [phone], select option 4, select option 2, ask to apply for  Patient Assistance Program. Meredeth will ask your household income, and how many people  are in your household. They will quote your out-of-pocket cost based on that information.  [employer] will also be able to set up a 81-month, interest-free payment plan if needed.  Applying the monitor   Shave hair from upper left chest.  Hold abrader disc by orange tab. Rub abrader in 40 strokes over the upper left chest as  indicated in your monitor instructions.  Clean area with 4 enclosed alcohol pads. Let dry.  Apply patch as indicated in monitor instructions. Patch will be placed under collarbone on left  side of chest with arrow pointing upward.  Rub patch adhesive wings for 2 minutes. Remove white label marked 1. Remove the white  label marked 2. Rub patch adhesive wings for 2 additional minutes.  While looking in a mirror, press and release button in center of patch. A small green light will  flash 3-4 times. This will be your only indicator that the monitor has been turned on.  Do not shower for the first 24 hours. You may shower after the first 24 hours.  Press the button if you feel a symptom. You will hear a small click. Record Date, Time and  Symptom in the Patient Logbook.  When you are ready to remove the patch, follow instructions on the last 2 pages of Patient  Logbook. Stick patch monitor  onto the last page of Patient Logbook.  Place Patient Logbook in the blue and white box. Use locking tab on box and tape box closed  securely. The blue and white box has prepaid postage on it. Please place it in the mailbox as  soon as possible. Your physician should have your test results approximately 7 days after the  monitor has been mailed back to [facility].  Call [facility] Customer Care at [phone] if you have questions regarding  your ZIO XT patch monitor. Call them  immediately if you see an orange light blinking on your  monitor.  If your monitor falls off in less than 4 days, contact our Monitor department at [phone].  If your monitor becomes loose or falls off after 4 days call [employer] at [phone] for  suggestions on securing your monitor    Follow-Up: At [facility], you and your health needs are our priority.  As part of our continuing mission to provide you with exceptional heart care, we have created designated Provider Care Teams.  These Care Teams include your primary Cardiologist (physician) and Advanced Practice Providers (APPs -  Physician Assistants and Nurse Practitioners) who all work together to provide you with the care you need, when you need it.  We recommend signing up for the patient portal called MyChart.  Sign up information is provided on this After Visit Summary.  MyChart is used to connect with patients for Virtual Visits (Telemedicine).  Patients are able to view lab/test results, encounter notes, upcoming appointments, etc.  Non-urgent messages can be sent to your provider as well.   To learn more about what you can do with MyChart, go to [email or web address].    Your next appointment:   2 month(s)  Provider:   [name], MD     Other Instructions Purchase a pulse oximeter or kardia mobile

## 2023-01-19 ENCOUNTER — Encounter: Payer: Self-pay | Admitting: Cardiology

## 2023-01-19 NOTE — Telephone Encounter (Signed)
 It looks like I have only seen him as a research visit. He saw Jackee recently. He has h/o HFrEF and PAF. If he remains tachycardic and hypotensive at this time (4 hours since his original message), recommend ER visit,. If BP and HR improved, recommend DOD f/u visit tomorrow 1/10.  01/19/23

## 2023-01-20 ENCOUNTER — Telehealth: Payer: Self-pay

## 2023-01-20 ENCOUNTER — Other Ambulatory Visit (HOSPITAL_COMMUNITY): Payer: Self-pay

## 2023-01-20 ENCOUNTER — Encounter: Payer: Self-pay | Admitting: Cardiology

## 2023-01-20 ENCOUNTER — Ambulatory Visit: Payer: MEDICAID | Attending: Cardiology | Admitting: Cardiology

## 2023-01-20 VITALS — BP 102/58 | HR 75 | Ht 72.0 in | Wt 169.6 lb

## 2023-01-20 DIAGNOSIS — I480 Paroxysmal atrial fibrillation: Secondary | ICD-10-CM

## 2023-01-20 DIAGNOSIS — D6869 Other thrombophilia: Secondary | ICD-10-CM

## 2023-01-20 DIAGNOSIS — I5022 Chronic systolic (congestive) heart failure: Secondary | ICD-10-CM

## 2023-01-20 DIAGNOSIS — I2510 Atherosclerotic heart disease of native coronary artery without angina pectoris: Secondary | ICD-10-CM

## 2023-01-20 MED ORDER — AMIODARONE HCL 200 MG PO TABS
ORAL_TABLET | ORAL | 3 refills | Status: DC
  Filled 2023-01-20: qty 110, 82d supply, fill #0

## 2023-01-20 NOTE — Telephone Encounter (Signed)
 Call to patient to clarify symptoms.  Patient reports his daughter helped him write MyChart message and got confused on the timeline of events. He states that yesterday, 01/19/23 at 4 AM he had an episode of irregular and rapid HR that lasted 2 hours. HR was 130, bp was around 80/60. He states his BP has been low, around 90/60 or 100/60 since he was hospitalized for afib on 12/19/22.   I asked patient how frequently he is having arrythmia episodes as he also reported it improves after a few hours, but it keeps me up. I have no appetite and I am fatigued. It is worse after the arrhythmia episodes. Patient states he is unsure, he states he has been fine since his most recent episode, today his HR is 72 and BP is 91/60.   Patient accepted DOD appt for today at 10:30 AM with Dr. Kennyth.

## 2023-01-20 NOTE — Patient Instructions (Signed)
 Medication Instructions:  Your physician has recommended you make the following change in your medication:  1) START taking amiodarone  400 mg twice daily for one week, then decrease to 200 mg twice daily for one week, then decrease to 200 mg once daily thereafter  *If you need a refill on your cardiac medications before your next appointment, please call your pharmacy*  Follow-Up: At [facility], you and your health needs are our priority.  As part of our continuing mission to provide you with exceptional heart care, we have created designated Provider Care Teams.  These Care Teams include your primary Cardiologist (physician) and Advanced Practice Providers (APPs -  Physician Assistants and Nurse Practitioners) who all work together to provide you with the care you need, when you need it.  Your next appointment:   3 months  Provider:   You may see Fonda Kitty, MD or one of the following Advanced Practice Providers on your designated Care Team:   Charlies Arthur, PA-C Michael Andy Tillery, PA-C Suzann Riddle, NP Daphne Barrack, NP

## 2023-01-20 NOTE — Progress Notes (Signed)
 Electrophysiology Office Note:   Date:  01/20/2023  ID:  Garrett Roberts, DOB February 12, 1955, MRN [id number]  Primary Cardiologist: [name], MD Primary Heart Failure: None Electrophysiologist: Fonda Kitty, MD      History of Present Illness:   Garrett Roberts is a 68 y.o. male with h/o PMH of CAD s/p inferior STEMI 10/2021 treated with DES to RCA and recent anterior STEMI on 12/10/2022 s/p DES to LAD, chronic systolic heart failure, paroxysmal AF, DM type II, ICM, CKD stage II, HTN, HLD who is being seen today for atrial fibrillation at the request of Dr. Lawrence.   Patient presented to ED on 12/10/22 with chest pain. He was found to have anterior STEMI. He underwent emergent LHC with PCI to prox LAD. Echo was done during that hospitalization and showed newly reduced LVEF of 25%. Course was complicated by AF with RVR. He was started on amiodarone  and underwent TEE/DCCV with conversion to sinus rhythm. He was discharged on oral amiodarone  and Eiquis on 12/15/22. He was seen in office for 1 month follow up and amiodarone  was discontinued. Several days later he developed palpitations and found his heart rate to be 130bpm. His blood pressure was also lower in this setting. Additional symptoms were shortness of breath and fatigue. His heart rate improved today and he began feeling a little better. Otherwise no new or acute complaints.  Review of systems complete and found to be negative unless listed in HPI.   EP Information / Studies Reviewed:    EKG is ordered today. Personal review as below.  EKG Interpretation Date/Time:  Friday January 20 2023 10:27:22 EST Ventricular Rate:  75 PR Interval:  180 QRS Duration:  86 QT Interval:  386 QTC Calculation: 431 R Axis:   76  Text Interpretation: Normal sinus rhythm Septal infarct (cited on or before 27-Dec-2022) Lateral infarct (cited on or before 27-Dec-2022) T wave abnormality, consider inferior ischemia When compared with  ECG of 27-Dec-2022 14:10,more artifact obscuring tracing today. Confirmed by Kitty Fonda [id number]) on 01/20/2023 6:32:25 PM   Echo 12/11/22:   1. Mid septum to apex is akinetic consistent with LAD infarction. No LV  apical thrombus on contrast imaging. Left ventricular ejection fraction,  by estimation, is 20 to 25%. The left ventricle has severely decreased  function. The left ventricle  demonstrates regional wall motion abnormalities (see scoring  diagram/findings for description). There is moderate concentric left  ventricular hypertrophy. Left ventricular diastolic parameters are  consistent with Grade II diastolic dysfunction  (pseudonormalization).   2. Right ventricular systolic function is moderately reduced. The right  ventricular size is normal.   3. The mitral valve is grossly normal. Mild mitral valve regurgitation.  No evidence of mitral stenosis.   4. The aortic valve is tricuspid. Aortic valve regurgitation is trivial.  No aortic stenosis is present.   5. Aortic dilatation noted. There is mild dilatation of the ascending  aorta, measuring 42 mm.   6. The inferior vena cava is dilated in size with >50% respiratory  variability, suggesting right atrial pressure of 8 mmHg.    Risk Assessment/Calculations:    CHA2DS2-VASc Score = 5   This indicates a 7.2% annual risk of stroke. The patient's score is based upon: CHF History: 1 HTN History: 1 Diabetes History: 1 Stroke History: 0 Vascular Disease History: 1 Age Score: 1 Gender Score: 0             Physical Exam:   VS:  BP (!) 102/58  Pulse 75   Ht 6' (1.829 m)   Wt 169 lb 9.6 oz (76.9 kg)   SpO2 97%   BMI 23.00 kg/m    Wt Readings from Last 3 Encounters:  01/20/23 169 lb 9.6 oz (76.9 kg)  01/16/23 171 lb (77.6 kg)  01/13/23 172 lb 8 oz (78.2 kg)     GEN: Well nourished, well developed in no acute distress NECK: No JVD CARDIAC: Normal rate, regular rhythm.  RESPIRATORY:  Clear to auscultation without  rales, wheezing or rhonchi  ABDOMEN: Soft, non-tender, non-distended EXTREMITIES:  No edema; No deformity   ASSESSMENT AND PLAN:   Garrett Roberts is a 68 y.o. male with h/o PMH of CAD s/p inferior STEMI 10/2021 treated with DES to RCA and recent anterior STEMI on 12/10/2022 s/p DES to LAD, chronic systolic heart failure, paroxysmal AF, DM type II, ICM, CKD stage II, HTN, HLD who is being seen today for atrial fibrillation at the request of Dr. Elmira.   #Symptomatic paroxysmal atrial fibrillation: Given his degree of LV dysfunction and hypotension with most recent episode, I think rhythm control strategy would be best at this time. This would give him best chance for LV recovery post revascularization while on GDMT. He was recently discontinued on amiodarone .  - Currently in sinus rhythm. We will resume oral amiodarone  with oral load in efforts to maintain sinus. - Continue Eliquis .  - Zio monitor ordered.   #Secondary hypercoagulable state due to atrial fibrillation:  -CHADSVASC score of 5. -Continue Eliquis .  #Chronic systolic heart failure: In setting of recent anterior STEMI.  #CAD s/p RCA STEMI in 2023 and LAD STEMI in 2024: - Continue GDMT.  - Repeat echo 90 days after revascularization. If LVEF remains <35% then recommend ICD.   Follow up with Dr. Kennyth  in 8 weeks.     Total time of encounter: 67 minutes total time of encounter, including chart review, face-to-face patient care, coordination of care and counseling regarding high complexity medical decision making.   Signed, Fonda Kennyth, MD

## 2023-01-25 DIAGNOSIS — I480 Paroxysmal atrial fibrillation: Secondary | ICD-10-CM

## 2023-01-30 NOTE — Telephone Encounter (Signed)
Attempted to contact patient regarding proof of income. Line was disconnected, then forwarded to voicemail when I tried calling again. Left message for patient.

## 2023-01-30 NOTE — Telephone Encounter (Signed)
Attempt to contact [employer] for status update. Office is currently closed for the holiday. Will try again

## 2023-02-01 NOTE — Telephone Encounter (Signed)
Contacted [employer] for status update. Application has not yet been processed. Will continue to follow up.

## 2023-02-02 NOTE — Telephone Encounter (Signed)
Contacted [employer]MS for status update. [employer]MS is requesting clarification of application. Forms updated and refaxed on 02/02/23.

## 2023-02-02 NOTE — Telephone Encounter (Signed)
If unable to wait till 1/27 due to acuity of symptoms, consider DOD visit sooner.  If severe shortness of breath at rest, may need to go to ER.  Thanks MJP

## 2023-02-06 ENCOUNTER — Other Ambulatory Visit (HOSPITAL_COMMUNITY): Payer: Self-pay

## 2023-02-06 ENCOUNTER — Ambulatory Visit (HOSPITAL_COMMUNITY)
Admission: RE | Admit: 2023-02-06 | Discharge: 2023-02-06 | Disposition: A | Discharge status: Home / Self Care | Payer: MEDICAID | Source: Ambulatory Visit | Attending: Cardiology | Admitting: Cardiology

## 2023-02-06 ENCOUNTER — Encounter (HOSPITAL_COMMUNITY): Payer: Self-pay | Admitting: Cardiology

## 2023-02-06 ENCOUNTER — Other Ambulatory Visit (HOSPITAL_COMMUNITY): Payer: Self-pay | Admitting: Physician Assistant

## 2023-02-06 VITALS — BP 100/60 | HR 69 | Wt 161.6 lb

## 2023-02-06 DIAGNOSIS — I255 Ischemic cardiomyopathy: Secondary | ICD-10-CM | POA: Insufficient documentation

## 2023-02-06 DIAGNOSIS — I5022 Chronic systolic (congestive) heart failure: Secondary | ICD-10-CM | POA: Insufficient documentation

## 2023-02-06 DIAGNOSIS — Z79899 Other long term (current) drug therapy: Secondary | ICD-10-CM | POA: Insufficient documentation

## 2023-02-06 DIAGNOSIS — I480 Paroxysmal atrial fibrillation: Secondary | ICD-10-CM | POA: Insufficient documentation

## 2023-02-06 DIAGNOSIS — Z955 Presence of coronary angioplasty implant and graft: Secondary | ICD-10-CM | POA: Insufficient documentation

## 2023-02-06 DIAGNOSIS — R059 Cough, unspecified: Secondary | ICD-10-CM | POA: Insufficient documentation

## 2023-02-06 DIAGNOSIS — I252 Old myocardial infarction: Secondary | ICD-10-CM | POA: Insufficient documentation

## 2023-02-06 DIAGNOSIS — Z7902 Long term (current) use of antithrombotics/antiplatelets: Secondary | ICD-10-CM | POA: Insufficient documentation

## 2023-02-06 DIAGNOSIS — E119 Type 2 diabetes mellitus without complications: Secondary | ICD-10-CM | POA: Insufficient documentation

## 2023-02-06 DIAGNOSIS — Z7901 Long term (current) use of anticoagulants: Secondary | ICD-10-CM | POA: Insufficient documentation

## 2023-02-06 DIAGNOSIS — Z7984 Long term (current) use of oral hypoglycemic drugs: Secondary | ICD-10-CM | POA: Insufficient documentation

## 2023-02-06 DIAGNOSIS — I2510 Atherosclerotic heart disease of native coronary artery without angina pectoris: Secondary | ICD-10-CM | POA: Insufficient documentation

## 2023-02-06 DIAGNOSIS — I110 Hypertensive heart disease with heart failure: Secondary | ICD-10-CM | POA: Insufficient documentation

## 2023-02-06 LAB — COMPREHENSIVE METABOLIC PANEL
ALT: 11 U/L (ref 0–44)
AST: 15 U/L (ref 15–41)
Albumin: 3.6 g/dL (ref 3.5–5.0)
Alkaline Phosphatase: 38 U/L (ref 38–126)
Anion gap: 14 (ref 5–15)
BUN: 16 mg/dL (ref 8–23)
CO2: 21 mmol/L — ABNORMAL LOW (ref 22–32)
Calcium: 9.5 mg/dL (ref 8.9–10.3)
Chloride: 99 mmol/L (ref 98–111)
Creatinine, Ser: 1.49 mg/dL — ABNORMAL HIGH (ref 0.61–1.24)
GFR, Estimated: 51 mL/min — ABNORMAL LOW (ref >60)
Glucose, Bld: 139 mg/dL — ABNORMAL HIGH (ref 70–99)
Potassium: 4.8 mmol/L (ref 3.5–5.1)
Sodium: 134 mmol/L — ABNORMAL LOW (ref 135–145)
Total Bilirubin: 1.2 mg/dL (ref 0.0–1.2)
Total Protein: 7.3 g/dL (ref 6.5–8.1)

## 2023-02-06 LAB — CBC
HCT: 45.8 % (ref 39.0–52.0)
Hemoglobin: 14.9 g/dL (ref 13.0–17.0)
MCH: 29.2 pg (ref 26.0–34.0)
MCHC: 32.5 g/dL (ref 30.0–36.0)
MCV: 89.6 fL (ref 80.0–100.0)
Platelets: 328 10*3/uL (ref 150–400)
RBC: 5.11 MIL/uL (ref 4.22–5.81)
RDW: 14.1 % (ref 11.5–15.5)
WBC: 5.3 10*3/uL (ref 4.0–10.5)
nRBC: 0 % (ref 0.0–0.2)

## 2023-02-06 LAB — LIPID PANEL
Cholesterol: 116 mg/dL (ref 0–200)
HDL: 40 mg/dL — ABNORMAL LOW (ref >40)
LDL Cholesterol: 60 mg/dL (ref 0–99)
Total CHOL/HDL Ratio: 2.9 {ratio}
Triglycerides: 81 mg/dL (ref <150)
VLDL: 16 mg/dL (ref 0–40)

## 2023-02-06 LAB — TSH: TSH: 7.943 u[IU]/mL — ABNORMAL HIGH (ref 0.350–4.500)

## 2023-02-06 LAB — BRAIN NATRIURETIC PEPTIDE: B Natriuretic Peptide: 586.4 pg/mL — ABNORMAL HIGH (ref 0.0–100.0)

## 2023-02-06 MED ORDER — AMIODARONE HCL 200 MG PO TABS
200.0000 mg | ORAL_TABLET | Freq: Every day | ORAL | 3 refills | Status: DC
  Filled 2023-02-06 – 2023-02-25 (×2): qty 90, 90d supply, fill #0

## 2023-02-06 MED ORDER — PANTOPRAZOLE SODIUM 40 MG PO TBEC
40.0000 mg | DELAYED_RELEASE_TABLET | Freq: Two times a day (BID) | ORAL | 3 refills | Status: DC
  Filled 2023-02-06: qty 60, 30d supply, fill #0
  Filled 2023-03-07: qty 60, 30d supply, fill #1
  Filled 2023-04-06: qty 60, 30d supply, fill #2
  Filled 2023-05-02 – 2023-06-02 (×3): qty 60, 30d supply, fill #3
  Filled 2023-07-09: qty 60, 30d supply, fill #4
  Filled 2023-07-28 – 2023-09-07 (×3): qty 60, 30d supply, fill #5
  Filled 2023-10-19: qty 60, 30d supply, fill #6
  Filled 2023-11-14: qty 60, 30d supply, fill #7
  Filled 2023-12-19: qty 60, 30d supply, fill #8
  Filled 2024-01-25: qty 60, 30d supply, fill #9

## 2023-02-06 MED ORDER — SPIRONOLACTONE 25 MG PO TABS
12.5000 mg | ORAL_TABLET | Freq: Every day | ORAL | 3 refills | Status: DC
Start: 2023-02-06 — End: 2023-02-20
  Filled 2023-02-06: qty 15, 30d supply, fill #0

## 2023-02-06 NOTE — Progress Notes (Signed)
PCP: Pcp, No HF Cardiology: Dr. Shirlee Latch  68 y.o. with history of CAD, DM II, HTN, HLD, ischemic cardiomyopathy/chronic systolic CHF was referred to HF MD clinic from [facility] clinic by PA [facility].   Patient had inferior STEMI 10/23 s/p PCI/DES to RCA. Had residual diffuse disease in diagonals and OMs. EF 40-45% at time of cath. Subsequent echo in 10/23 with EF 55-60%.   He was not taking medications in 2024 after losing insurance. Presented with anterior STEMI 12/10/22. Cardiac cath with 100% proximal LAD treated with PCI/DES. Prior RCA stent patent, severe disease in tortuous ramus and distal LCx treated medically.  Echo with EF 25%, no LV thrombus, AK mid septum to apex, moderately reduced RV systolic function. Course complicated by atrial fibrillation with RVR. Started on IV amiodarone and underwent DCCV to SR.  GDMT limited by soft blood pressure. Also seen by GI for abdominal pain. Had thickening of stomach on CT scan. H. Pylori later resulted positive after discharge, seen by GI and started on treatment for H pylori-related gastritis.   Returned to ED 12/19/22 with recurrent atrial fibrillation. He converted to SR while in waiting room and was discharged home.  BP mildly low today at 100/60.  He denies lightheadedness. He gets fatigued easily.  He is short of breath walking up stairs, generally does ok on flat ground.  Occasional transient palpitations.  No chest pain.  No orthopnea/PND, but he develops a cough when he lies down at night.  He thinks that he is taking pantoprazole. He will take prn Lasix occasionally when he has ankle edema.   ECG (personally reviewed): NSR, nonspecific T wave changes  Labs (12/24): hgb 15.1, BNP 350, K 4.4, creatinine 1.24  SH: No ETOH or tobacco use.  Lives at home in [place] with his wife and children. Originally from [place].   PMH: 1. Chronic systolic CHF: Ischemic cardiomyopathy.   - Echo (12/24): EF 20-25%, LAD territory WMAs, moderate LVH,  moderately decreased RV systolic function, mild MR.  2. CAD: Inferior STEMI 10/23 with DES to RCA.  - Anterior STEMI (11/24): Occluded proximal LAD treated with DES, occluded D2, 90% OM1, 95% distal LCx.  Distal LCx and OM1 treated medically.  3. Atrial fibrillation: Paroxysmal.  - DCCV in 12/24.  4. H pylori gastritis 5. Hyperlipidemia 6. Type 2 diabetes  Current Outpatient Medications  Medication Sig Dispense Refill   apixaban (ELIQUIS) 5 MG TABS tablet Take 1 tablet (5 mg total) by mouth 2 (two) times daily.     Ascorbic Acid (VITAMIN C PO) Take 1 tablet by mouth daily.     atorvastatin (LIPITOR) 80 MG tablet Take 1 tablet (80 mg total) by mouth at bedtime. 30 tablet 2   clopidogrel (PLAVIX) 75 MG tablet Take 1 tablet (75 mg total) by mouth daily. 30 tablet 2   Cyanocobalamin (B-12 PO) Take 1 tablet by mouth daily.     empagliflozin (JARDIANCE) 10 MG TABS tablet Take 1 tablet (10 mg total) by mouth daily before breakfast.     ezetimibe (ZETIA) 10 MG tablet Take 1 tablet (10 mg total) by mouth daily. 30 tablet 2   furosemide (LASIX) 20 MG tablet Take 1 tablet (20 mg total) by mouth daily as needed (weight gain (3lbs in 1 day or 5lbs in 1 week) or worsening swelling). 30 tablet 2   MAGNESIUM PO Take 1 tablet by mouth daily.     Menaquinone-7 (K2 PO) Take 1 tablet by mouth daily.  metFORMIN (GLUCOPHAGE) 500 MG tablet Take 1 tablet (500 mg total) by mouth daily with breakfast. 30 tablet 1   metoprolol succinate (TOPROL-XL) 25 MG 24 hr tablet Take 1 tablet (25 mg total) by mouth daily. 30 tablet 2   Multiple Vitamin (MULTIVITAMIN PO) Take 1 tablet by mouth daily.     Multiple Vitamins-Minerals (ZINC PO) Take 1 tablet by mouth daily.     nitroGLYCERIN (NITROSTAT) 0.4 MG SL tablet Place 1 tablet (0.4 mg total) under the tongue every 5 (five) minutes x 3 doses as needed for chest pain. 25 tablet 2   sacubitril-valsartan (ENTRESTO) 24-26 MG Take 1 tablet by mouth 2 (two) times daily. 60 tablet  3   spironolactone (ALDACTONE) 25 MG tablet Take 1/2 tablets (12.5 mg total) by mouth daily. 45 tablet 3   VITAMIN D PO Take 1 tablet by mouth daily.     amiodarone (PACERONE) 200 MG tablet Take 1 tablet (200 mg total) by mouth daily. 90 tablet 3   pantoprazole (PROTONIX) 40 MG tablet Take 1 tablet (40 mg total) by mouth 2 (two) times daily before a meal. 180 tablet 3   No current facility-administered medications for this encounter.    Allergies  Allergen Reactions   Beef-Derived Drug Products Other (See Comments)    Religious reasons   Pork-Derived Products     Religious reasons       Family History  Problem Relation Age of Onset   Diabetes Mother    Chronic Renal Failure Father    Diabetes Father    Chronic Renal Failure Brother    Diabetes Brother    Diabetes Brother    Heart attack Brother    Stroke Brother     Vitals:   02/06/23 0934  BP: 100/60  Pulse: 69  SpO2: 100%  Weight: 73.3 kg (161 lb 9.6 oz)    PHYSICAL EXAM: General: NAD Neck: No JVD, no thyromegaly or thyroid nodule.  Lungs: Clear to auscultation bilaterally with normal respiratory effort. CV: Nondisplaced PMI.  Heart regular S1/S2, no S3/S4, no murmur.  No peripheral edema.  No carotid bruit.  Normal pedal pulses.  Abdomen: Soft, nontender, no hepatosplenomegaly, no distention.  Skin: Intact without lesions or rashes.  Neurologic: Alert and oriented x 3.  Psych: Normal affect. Extremities: No clubbing or cyanosis.  HEENT: Normal.   ASSESSMENT & PLAN: 1. Chronic systolic CHF/ischemic cardiomyopathy:  Echo 12/24 with EF 20-25%, no LV thrombus, AK mid septum to apex, moderately reduced RV systolic function. NYHA class II-III symptoms, he does not look volume overloaded on exam.  Has a nocturnal cough, ?GERD versus volume overload. BP is relatively soft, making GDMT titration difficult.  I worry about him long-term.  - Continue Toprol XL 25 mg daily - Continue Jardiance 10 mg daily - Continue  Entresto 24/26 mg BID - Add spironolactone 12.5 mg daily.  BMET/BNP today and BMET in 10 days.  - Repeat echo in 3/25 to assess for ICD need.  Narrow QRS so not CRT candidate.  2. CAD:  Hx inferior STEMI 10/23 s/p PCI/DES to RCA. Anterior STEMI 12/10/22, cath showed occluded pLAD treated with PCI/DES, prior RCA stent patent, severe disease OM1 and distal LCx treated medically. No chest pain.  - Continue Plavix 75 mg daily.  - He can stop ASA 81 given apixaban use.  - Continue Atorvastatin 80 + Zetia 10 mg daily. Check lipids today.  3. Atrial fibrillation: Paroxysmal.  NSR today.  - Continue Eliquis 5 mg BID,  CBC today.  - Continue amiodarone 200 mg daily. Check LFTs and TSH.  He will need a regular eye exam.  - I would favor atrial fibrillation ablation, this may allow him to stop amiodarone safely. He has seen EP.  4. DM II: Per PCP.  He is on [place].  5. H pylori gastritis: Continue Protonix (he will make sure he is actually taking this).  Nocturnal cough may be GERD.   Followup 3 wks with HF pharmacist for medication titration, see me in 6 wks.   Marca Ancona 02/06/2023

## 2023-02-06 NOTE — Progress Notes (Signed)
Medication Samples have been provided to the patient.  Drug name: ELIQUIS       Strength: 5 MG         Qty: 4 BOXES  LOT: [id number]  Exp.Date: 01/26  Dosing instructions: Take 1 tablet Twice daily  Medication Samples have been provided to the patient.  Drug name: Jardiance       Strength: 10 mg        Qty: 4 boxes  LOT: [id number]  Exp.Date: 11/26  Dosing instructions: Take 1 tablet daily.  Medication Samples have been provided to the patient.  Drug name: Sherryll Burger       Strength: 24/26 mg        Qty: 4 bottles  LOT: [id number]  Exp.Date: 4/26  Dosing instructions: take 1 tablet Twice daily   The patient has been instructed regarding the correct time, dose, and frequency of taking this medication, including desired effects and most common side effects.   Kirill Chatterjee M Alyxandra Tenbrink 10:13 AM 02/06/2023

## 2023-02-06 NOTE — Patient Instructions (Signed)
STOP Asprin,  START Spironolactone 12.5 mg ( 1/2 tab ) daily.  Labs done today, your results will be available in MyChart, we will contact you for abnormal readings.  Repeat blood work in 10 days   Your physician has requested that you have an echocardiogram. Echocardiography is a painless test that uses sound waves to create images of your heart. It provides your doctor with information about the size and shape of your heart and how well your heart's chambers and valves are working. This procedure takes approximately one hour. There are no restrictions for this procedure. Please do NOT wear cologne, perfume, aftershave, or lotions (deodorant is allowed). Please arrive 15 minutes prior to your appointment time.  Please note: We ask at that you not bring children with you during ultrasound (echo/ vascular) testing. Due to room size and safety concerns, children are not allowed in the ultrasound rooms during exams. Our front office staff cannot provide observation of children in our lobby area while testing is being conducted. An adult accompanying a patient to their appointment will only be allowed in the ultrasound room at the discretion of the ultrasound technician under special circumstances. We apologize for any inconvenience.  Please follow up with our heart failure pharmacist in 3 weeks.  Your physician recommends that you schedule a follow-up appointment in: 6 weeks.  If you have any questions or concerns before your next appointment please send [place] a message through [place] or call our office at [phone].    TO LEAVE A MESSAGE FOR THE NURSE SELECT OPTION 2, PLEASE LEAVE A MESSAGE INCLUDING: YOUR NAME DATE OF BIRTH CALL BACK NUMBER REASON FOR CALL**this is important as we prioritize the call backs  YOU WILL RECEIVE A CALL BACK THE SAME DAY AS LONG AS YOU CALL BEFORE 4:00 PM  At the [facility], you and your health needs are our priority. As part of our  continuing mission to provide you with exceptional heart care, we have created designated Provider Care Teams. These Care Teams include your primary Cardiologist (physician) and Advanced Practice Providers (APPs- Physician Assistants and Nurse Practitioners) who all work together to provide you with the care you need, when you need it.   You may see any of the following providers on your designated Care Team at your next follow up: Dr Arvilla Meres Dr Marca Ancona Dr. Dorthula Nettles Dr. Clearnce Hasten Amy Filbert Schilder, NP Robbie Lis, [place] [facility] [place], [place] Brynda Peon, NP [place] Lee, NP Karle Plumber, PharmD   Please be sure to bring in all your medications bottles to every appointment.    Thank you for choosing [facility]

## 2023-02-09 NOTE — Telephone Encounter (Signed)
Contacted [employer] for status update. Updated form has been received, but has not been processed as of yet. Representative is escalating the application and determination should be made within 3-5 business days.

## 2023-02-09 NOTE — Telephone Encounter (Signed)
Contacted [employer] for update. Representative was able to find the application and manually approve most of the information over the phone with me. Application has been sent off for final review and should have a determination within 1-3 business days. Will continue to follow up.

## 2023-02-10 ENCOUNTER — Telehealth (HOSPITAL_COMMUNITY): Payer: Self-pay | Admitting: Cardiology

## 2023-02-10 ENCOUNTER — Other Ambulatory Visit (HOSPITAL_COMMUNITY): Payer: Self-pay

## 2023-02-10 DIAGNOSIS — R7989 Other specified abnormal findings of blood chemistry: Secondary | ICD-10-CM

## 2023-02-10 MED ORDER — METFORMIN HCL 500 MG PO TABS
500.0000 mg | ORAL_TABLET | Freq: Every day | ORAL | 0 refills | Status: DC
  Filled 2023-02-10: qty 30, 30d supply, fill #0

## 2023-02-10 MED ORDER — ROSUVASTATIN CALCIUM 40 MG PO TABS
40.0000 mg | ORAL_TABLET | Freq: Every day | ORAL | 3 refills | Status: DC
  Filled 2023-02-10: qty 30, 30d supply, fill #0
  Filled 2023-03-07: qty 30, 30d supply, fill #1
  Filled 2023-04-06: qty 30, 30d supply, fill #2

## 2023-02-10 NOTE — Telephone Encounter (Signed)
Patient called.  Patient aware.   Metformin refilled as courtesy Pt aware to establish care  Free T3 and [facility] [place] labs ([phone]) Free t4 to be drawn at upcoming pharmacy appt

## 2023-02-10 NOTE — Telephone Encounter (Signed)
-----   Message from Marca Ancona sent at 02/06/2023  5:44 PM EST ----- Lipids better but would like to see LDL < 55.  Stop atorvastatin, start rosuvastatin 40 mg daily.  Lipids/LFTs in 2 months. Also, check free T3 and free T4 with elevated TSH.

## 2023-02-13 ENCOUNTER — Other Ambulatory Visit (HOSPITAL_COMMUNITY): Payer: Self-pay

## 2023-02-13 NOTE — Progress Notes (Signed)
[facility] Note   PCP: Pcp, No HF Cardiology: Dr. Shirlee Latch  HPI:  68 y.o. with history of CAD, DM II, HTN, HLD, ischemic cardiomyopathy/chronic systolic CHF/   Patient had inferior STEMI 10/2021 s/p PCI/DES to RCA. Had residual diffuse disease in diagonals and OMs. EF 40-45% at time of cath. Subsequent echo in 10/2021 with EF 55-60%.    He was not taking medications in 2024 after losing insurance. Presented with anterior STEMI 12/10/22. Cardiac cath with 100% proximal LAD treated with PCI/DES. Prior RCA stent patent, severe disease in tortuous ramus and distal LCx treated medically.  Echo with EF 25%, no LV thrombus, AK mid septum to apex, moderately reduced RV systolic function. Course complicated by atrial fibrillation with RVR. Started on IV amiodarone and underwent DCCV to SR.  GDMT limited by soft blood pressure. Also seen by GI for abdominal pain. Had thickening of stomach on CT scan. H. Pylori later resulted positive after discharge, seen by GI and started on treatment for H pylori-related gastritis.    Returned to ED 12/19/22 with recurrent atrial fibrillation. He converted to SR while in waiting room and was discharged home.   Presented to AHF Clinic 02/06/23  with Dr. Shirlee Latch. BP was mildly low at 100/60.  He denied lightheadedness. He reported getting fatigued easily.  He was short of breath walking up stairs, generally reported doing ok on flat ground.  Occasional transient palpitations.  No chest pain.  No orthopnea/PND, but noted he develops a cough when he lies down at night.  He thought that he was taking pantoprazole. He will take prn Lasix occasionally when he has ankle edema.     Today he returns to HF clinic for pharmacist medication titration. At last visit with MD spironolactone 12.5 mg daily was initiated and aspirin was stopped as he was also taking Eliquis and clopidogrel. Additionally, LDL was still not at goal of <55 so atorvastatin was stopped and  rosuvastatin 40 mg daily was initiated. He presented to ED 02/16/23 and was admitted for acute heart failure exacerbation. He had intermittent chest pain (no EKG changes and trops flat) and productive cough that worsened with positional changes. BNP was elevated at 710.8 compared to 586.4 11 days prior). He was diuresed with IV Lasix, which was transitioned to torsemide at discharge. Spironolactone and Entresto were held due to hypotension. Metoprolol succinate was decreased to 12.5 mg daily. Overall he is feeling ok today. His daughter helps with his medications. Will occasionally get dizzy when standing. BP at home has been 92-94/60s. BP in clinic 94/62. No CP. Has had two episodes of feeling like his heart is beating with an extra beat. Happened this morning and 3 days ago. Says he does not walk a lot but can walk 5-7 minutes at a time. Feels like his weight has steadily decreased since admission in November.  Weight was 148 lbs on his scale at home. Weight on our scale was 156.4 lbs, but he was wearing heavy coat and this was 5 lbs lower than weight last visit on 02/06/23. Reports no more leg swelling and no nocturnal cough since he was discharged on 02/20/23. No LEE, PND or orthopnea. Main complaint is lack of appetite, which he is not sure if this is related to medications, GERD or HF.   HF Medications: Metoprolol succinate 12.5 mg daily Jardiance 10 mg daily Torsemide 20 mg daily  Has the patient been experiencing any side effects to the medications prescribed?  Notes lack of appetite but unclear if this is related to a medication or something else (GERD, HF).   Does the patient have any problems obtaining medications due to transportation or finances?   Uninsured. Has been approved to receive Jardiance and Eliquis though manufacturer PAP programs. [employer] application is still pending income documentation; however, Sherryll Burger has been recently discontinued so nothing needed at this time.    Understanding of regimen: good Understanding of indications: good Potential of compliance: good Patient understands to avoid NSAIDs. Patient understands to avoid decongestants.    Pertinent Lab Values: 02/20/23: Serum creatinine 1.33, BUN 13, Potassium 4.2, Sodium 140, BNP 710.8 (02/16/23), Magnesium 2.1,  BMET, BNP, Free T3/T4 pending  Vital Signs: Weight: 156.4 lbs (last clinic weight: 161.6 lbs) Blood pressure: 94/62  Heart rate: 71   Assessment/Plan: 1. Chronic systolic CHF/ischemic cardiomyopathy:  Echo 12/2022 with EF 20-25%, no LV thrombus, AK mid septum to apex, moderately reduced RV systolic function.  - NYHA class II-III symptoms, he does not look volume overloaded on exam.  BP remains soft despite recent discontinuation of Entresto and spironolactone. - Continue torsemide 20 mg daily - Continue metoprolol succinate 12.5 mg daily  - Continue Jardiance 10 mg daily. Has been approved for manufacturer's assistance.  - Will remain off Entresto and spironolactone for now given soft BP. - Repeat echo in 03/2023 to assess for ICD need.  Narrow QRS so not CRT candidate.  - BMET, BNP today pending 2. CAD:  Hx inferior STEMI 10/2021 s/p PCI/DES to RCA. Anterior STEMI 12/10/22, cath showed occluded pLAD treated with PCI/DES, prior RCA stent patent, severe disease OM1 and distal LCx treated medically. No chest pain.  - Continue Plavix 75 mg daily.  - No ASA 81 given apixaban use.  - Continue rosuvastatin 40 mg daily + Zetia 10 mg daily.   3. Atrial fibrillation: Paroxysmal.   - Continue Eliquis 5 mg BID. Has been approved for manufacturer's assistance.  - Continue amiodarone 200 mg daily. Will obtain free T4/T3 today per Dr. Alford Highland orders.  4. DM II: Per PCP.  He is on [place].  - A1C 7.8 on 02/16/23 5. H pylori gastritis: Continue Protonix   Follow up 1 month with Dr. Birdie Riddle.    Karle Plumber, PharmD, BCPS, BCCP, CPP Heart Failure Clinic Pharmacist [phone]

## 2023-02-13 NOTE — Telephone Encounter (Signed)
Patient was approved to receive Eliquis from [employer] Effective 02/10/2023 to 02/09/2024

## 2023-02-15 NOTE — Telephone Encounter (Signed)
 Patient was approved to receive Jardiance  from [facility] Effective 02/09/2023 to 02/09/2024

## 2023-02-16 ENCOUNTER — Emergency Department (HOSPITAL_COMMUNITY): Payer: MEDICAID

## 2023-02-16 ENCOUNTER — Other Ambulatory Visit: Payer: Self-pay

## 2023-02-16 ENCOUNTER — Encounter (HOSPITAL_COMMUNITY): Payer: Self-pay

## 2023-02-16 ENCOUNTER — Inpatient Hospital Stay (HOSPITAL_COMMUNITY)
Admission: EM | Admit: 2023-02-16 | Discharge: 2023-02-20 | DRG: 291 | Disposition: A | Discharge status: Home / Self Care | Payer: MEDICAID | Attending: Internal Medicine | Admitting: Internal Medicine

## 2023-02-16 DIAGNOSIS — Z79899 Other long term (current) drug therapy: Secondary | ICD-10-CM

## 2023-02-16 DIAGNOSIS — I480 Paroxysmal atrial fibrillation: Secondary | ICD-10-CM | POA: Diagnosis present

## 2023-02-16 DIAGNOSIS — K5900 Constipation, unspecified: Secondary | ICD-10-CM | POA: Diagnosis present

## 2023-02-16 DIAGNOSIS — E118 Type 2 diabetes mellitus with unspecified complications: Secondary | ICD-10-CM | POA: Diagnosis present

## 2023-02-16 DIAGNOSIS — I255 Ischemic cardiomyopathy: Secondary | ICD-10-CM | POA: Diagnosis present

## 2023-02-16 DIAGNOSIS — Z91014 Allergy to mammalian meats: Secondary | ICD-10-CM

## 2023-02-16 DIAGNOSIS — K219 Gastro-esophageal reflux disease without esophagitis: Secondary | ICD-10-CM | POA: Diagnosis present

## 2023-02-16 DIAGNOSIS — I952 Hypotension due to drugs: Secondary | ICD-10-CM | POA: Diagnosis not present

## 2023-02-16 DIAGNOSIS — Z6822 Body mass index (BMI) 22.0-22.9, adult: Secondary | ICD-10-CM

## 2023-02-16 DIAGNOSIS — J189 Pneumonia, unspecified organism: Principal | ICD-10-CM | POA: Diagnosis present

## 2023-02-16 DIAGNOSIS — Z7984 Long term (current) use of oral hypoglycemic drugs: Secondary | ICD-10-CM

## 2023-02-16 DIAGNOSIS — I130 Hypertensive heart and chronic kidney disease with heart failure and stage 1 through stage 4 chronic kidney disease, or unspecified chronic kidney disease: Secondary | ICD-10-CM | POA: Diagnosis not present

## 2023-02-16 DIAGNOSIS — Z841 Family history of disorders of kidney and ureter: Secondary | ICD-10-CM

## 2023-02-16 DIAGNOSIS — I252 Old myocardial infarction: Secondary | ICD-10-CM

## 2023-02-16 DIAGNOSIS — Z833 Family history of diabetes mellitus: Secondary | ICD-10-CM

## 2023-02-16 DIAGNOSIS — T500X5A Adverse effect of mineralocorticoids and their antagonists, initial encounter: Secondary | ICD-10-CM | POA: Diagnosis not present

## 2023-02-16 DIAGNOSIS — Z823 Family history of stroke: Secondary | ICD-10-CM

## 2023-02-16 DIAGNOSIS — I5023 Acute on chronic systolic (congestive) heart failure: Secondary | ICD-10-CM | POA: Diagnosis present

## 2023-02-16 DIAGNOSIS — Z7902 Long term (current) use of antithrombotics/antiplatelets: Secondary | ICD-10-CM

## 2023-02-16 DIAGNOSIS — Z8249 Family history of ischemic heart disease and other diseases of the circulatory system: Secondary | ICD-10-CM

## 2023-02-16 DIAGNOSIS — J470 Bronchiectasis with acute lower respiratory infection: Secondary | ICD-10-CM | POA: Diagnosis present

## 2023-02-16 DIAGNOSIS — I4891 Unspecified atrial fibrillation: Secondary | ICD-10-CM | POA: Diagnosis present

## 2023-02-16 DIAGNOSIS — E785 Hyperlipidemia, unspecified: Secondary | ICD-10-CM | POA: Diagnosis present

## 2023-02-16 DIAGNOSIS — E871 Hypo-osmolality and hyponatremia: Secondary | ICD-10-CM | POA: Diagnosis not present

## 2023-02-16 DIAGNOSIS — I509 Heart failure, unspecified: Secondary | ICD-10-CM | POA: Diagnosis not present

## 2023-02-16 DIAGNOSIS — Z1152 Encounter for screening for COVID-19: Secondary | ICD-10-CM

## 2023-02-16 DIAGNOSIS — I2510 Atherosclerotic heart disease of native coronary artery without angina pectoris: Secondary | ICD-10-CM | POA: Diagnosis present

## 2023-02-16 DIAGNOSIS — E669 Obesity, unspecified: Secondary | ICD-10-CM | POA: Diagnosis present

## 2023-02-16 DIAGNOSIS — N182 Chronic kidney disease, stage 2 (mild): Secondary | ICD-10-CM | POA: Diagnosis present

## 2023-02-16 DIAGNOSIS — E1122 Type 2 diabetes mellitus with diabetic chronic kidney disease: Secondary | ICD-10-CM | POA: Diagnosis present

## 2023-02-16 DIAGNOSIS — Z7901 Long term (current) use of anticoagulants: Secondary | ICD-10-CM

## 2023-02-16 DIAGNOSIS — I10 Essential (primary) hypertension: Secondary | ICD-10-CM | POA: Diagnosis present

## 2023-02-16 DIAGNOSIS — Z955 Presence of coronary angioplasty implant and graft: Secondary | ICD-10-CM

## 2023-02-16 LAB — BASIC METABOLIC PANEL
Anion gap: 10 (ref 5–15)
BUN: 13 mg/dL (ref 8–23)
CO2: 23 mmol/L (ref 22–32)
Calcium: 9.5 mg/dL (ref 8.9–10.3)
Chloride: 101 mmol/L (ref 98–111)
Creatinine, Ser: 1.1 mg/dL (ref 0.61–1.24)
GFR, Estimated: >60 mL/min (ref >60)
Glucose, Bld: 193 mg/dL — ABNORMAL HIGH (ref 70–99)
Potassium: 4.3 mmol/L (ref 3.5–5.1)
Sodium: 134 mmol/L — ABNORMAL LOW (ref 135–145)

## 2023-02-16 LAB — TROPONIN I (HIGH SENSITIVITY): Troponin I (High Sensitivity): 23 ng/L — ABNORMAL HIGH (ref <18)

## 2023-02-16 LAB — CBC
HCT: 46 % (ref 39.0–52.0)
Hemoglobin: 14.6 g/dL (ref 13.0–17.0)
MCH: 29 pg (ref 26.0–34.0)
MCHC: 31.7 g/dL (ref 30.0–36.0)
MCV: 91.3 fL (ref 80.0–100.0)
Platelets: 280 10*3/uL (ref 150–400)
RBC: 5.04 MIL/uL (ref 4.22–5.81)
RDW: 14.1 % (ref 11.5–15.5)
WBC: 5.8 10*3/uL (ref 4.0–10.5)
nRBC: 0 % (ref 0.0–0.2)

## 2023-02-16 LAB — RESP PANEL BY RT-PCR (RSV, FLU A&B, COVID)  RVPGX2
Influenza A by PCR: NEGATIVE
Influenza B by PCR: NEGATIVE
Resp Syncytial Virus by PCR: NEGATIVE
SARS Coronavirus 2 by RT PCR: NEGATIVE

## 2023-02-16 LAB — BRAIN NATRIURETIC PEPTIDE: B Natriuretic Peptide: 710.8 pg/mL — ABNORMAL HIGH (ref 0.0–100.0)

## 2023-02-16 MED ORDER — SODIUM CHLORIDE 0.9 % IV SOLN
1.0000 g | Freq: Once | INTRAVENOUS | Status: AC
  Administered 2023-02-17: 1 g via INTRAVENOUS
  Filled 2023-02-16: qty 10

## 2023-02-16 NOTE — ED Triage Notes (Signed)
 Pt arrived from home via POV c/o chest pain 7/10 tightness that radiates to back. Pt states that he has also had a cough for 3 or 4 days that continues to get worse, mucous is clear per pt.

## 2023-02-16 NOTE — ED Provider Notes (Signed)
 [place] EMERGENCY DEPARTMENT AT [facility] Provider Note   CSN: [id number] Arrival date & time: 02/16/23  2210     History  Chief Complaint  Patient presents with   Chest Pain   Cough    Garrett Roberts is a 68 y.o. male.  The history is provided by the patient and medical records.  Chest Pain Associated symptoms: cough   Cough Associated symptoms: chest pain   Garrett Roberts is a 68 y.o. male who presents to the Emergency Department complaining of chest pain and cough.  He presents to the emergency department for evaluation of intermittent chest pain for the last several weeks.  He has associated worsening productive cough over the last 2 days.  He is now unable to lay down at all secondary to significant cough.  Cough is productive of clear mucus.  No associated fever.  He does have shortness of breath.  He does have reflux at times, that does contribute to his cough but also feels like the fluid is coming from his lungs.  He does report mild lower extremity edema.  He does weigh himself daily and is losing weight.  He is compliant with all of his medications.  No vomiting, diarrhea.     Home Medications Prior to Admission medications   Medication Sig Start Date End Date Taking? Authorizing Provider  amiodarone  (PACERONE ) 200 MG tablet Take 1 tablet (200 mg total) by mouth daily. 02/06/23   Rolan Ezra RAMAN, MD  apixaban  (ELIQUIS ) 5 MG TABS tablet Take 1 tablet (5 mg total) by mouth 2 (two) times daily. 01/16/23   Wyn Jackee VEAR Mickey., NP  Ascorbic Acid (VITAMIN C PO) Take 1 tablet by mouth daily.    [provider]  clopidogrel  (PLAVIX ) 75 MG tablet Take 1 tablet (75 mg total) by mouth daily. 12/27/22   Colletta Manuelita Garre, PA-C  Cyanocobalamin  (B-12 PO) Take 1 tablet by mouth daily.    [provider]  empagliflozin  (JARDIANCE ) 10 MG TABS tablet Take 1 tablet (10 mg total) by mouth daily before breakfast. 01/16/23   Wyn Jackee VEAR Mickey., NP   ezetimibe  (ZETIA ) 10 MG tablet Take 1 tablet (10 mg total) by mouth daily. 12/27/22   Colletta Manuelita Garre, PA-C  furosemide  (LASIX ) 20 MG tablet Take 1 tablet (20 mg total) by mouth daily as needed (weight gain (3lbs in 1 day or 5lbs in 1 week) or worsening swelling). 12/27/22 12/27/23  Colletta Manuelita Garre, PA-C  MAGNESIUM PO Take 1 tablet by mouth daily.    [provider]  Menaquinone-7 (K2 PO) Take 1 tablet by mouth daily.    [provider]  metFORMIN  (GLUCOPHAGE ) 500 MG tablet Take 1 tablet (500 mg total) by mouth daily with breakfast. Courtesy fill- Additional refills will need to come from PCP 02/10/23   Rolan Ezra RAMAN, MD  metoprolol  succinate (TOPROL -XL) 25 MG 24 hr tablet Take 1 tablet (25 mg total) by mouth daily. 12/27/22   Colletta Manuelita Garre, PA-C  Multiple Vitamin (MULTIVITAMIN PO) Take 1 tablet by mouth daily.    [provider]  Multiple Vitamins-Minerals (ZINC PO) Take 1 tablet by mouth daily.    [provider]  nitroGLYCERIN  (NITROSTAT ) 0.4 MG SL tablet Place 1 tablet (0.4 mg total) under the tongue every 5 (five) minutes x 3 doses as needed for chest pain. 12/15/22   Henry Manuelita NOVAK, NP  pantoprazole  (PROTONIX ) 40 MG tablet Take 1 tablet (40 mg total) by mouth 2 (two)  times daily before a meal. 02/06/23   Rolan Ezra RAMAN, MD  rosuvastatin  (CRESTOR ) 40 MG tablet Take 1 tablet (40 mg total) by mouth daily. 02/10/23 05/11/23  Rolan Ezra RAMAN, MD  sacubitril-valsartan (ENTRESTO ) 24-26 MG Take 1 tablet by mouth 2 (two) times daily. 12/27/22   Colletta Manuelita Garre, PA-C  spironolactone  (ALDACTONE ) 25 MG tablet Take 1/2 tablets (12.5 mg total) by mouth daily. 02/06/23   McLean, Dalton S, MD  VITAMIN D PO Take 1 tablet by mouth daily.    [provider]      Allergies    Beef-derived drug products and Pork-derived products    Review of Systems   Review of Systems  Respiratory:  Positive for cough.   Cardiovascular:  Positive for  chest pain.  All other systems reviewed and are negative.   Physical Exam Updated Vital Signs BP 101/74   Pulse 71   Temp 98 F (36.7 C) (Oral)   Resp 16   Ht 6' (1.829 m)   Wt 73.9 kg   SpO2 99%   BMI 22.11 kg/m  Physical Exam Vitals and nursing note reviewed.  Constitutional:      Appearance: He is well-developed.  HENT:     Head: Normocephalic and atraumatic.  Cardiovascular:     Rate and Rhythm: Normal rate and regular rhythm.     Heart sounds: No murmur heard. Pulmonary:     Effort: Pulmonary effort is normal. No respiratory distress.     Comments: Decreased air movement in right lung fields Abdominal:     Palpations: Abdomen is soft.     Tenderness: There is no abdominal tenderness. There is no guarding or rebound.  Musculoskeletal:        General: No swelling or tenderness.  Skin:    General: Skin is warm and dry.  Neurological:     Mental Status: He is alert and oriented to person, place, and time.  Psychiatric:        Behavior: Behavior normal.     ED Results / Procedures / Treatments   Labs (all labs ordered are listed, but only abnormal results are displayed) Labs Reviewed  BASIC METABOLIC PANEL - Abnormal; Notable for the following components:      Result Value   Sodium 134 (*)    Glucose, Bld 193 (*)    All other components within normal limits  BRAIN NATRIURETIC PEPTIDE - Abnormal; Notable for the following components:   B Natriuretic Peptide 710.8 (*)    All other components within normal limits  HEMOGLOBIN A1C - Abnormal; Notable for the following components:   Hgb A1c MFr Bld 7.8 (*)    All other components within normal limits  TROPONIN I (HIGH SENSITIVITY) - Abnormal; Notable for the following components:   Troponin I (High Sensitivity) 23 (*)    All other components within normal limits  TROPONIN I (HIGH SENSITIVITY) - Abnormal; Notable for the following components:   Troponin I (High Sensitivity) 23 (*)    All other components within  normal limits  RESP PANEL BY RT-PCR (RSV, FLU A&B, COVID)  RVPGX2  CBC  PROCALCITONIN  HIV ANTIBODY (ROUTINE TESTING W REFLEX)    EKG EKG Interpretation Date/Time:  Thursday February 16 2023 22:36:24 EST Ventricular Rate:  66 PR Interval:  200 QRS Duration:  106 QT Interval:  400 QTC Calculation: 419 R Axis:   89  Text Interpretation: Normal sinus rhythm Septal infarct , age undetermined Lateral infarct , age undetermined Abnormal ECG  Confirmed by Griselda Norris [phone]) on 02/16/2023 10:55:20 PM  Radiology DG Chest 2 View Result Date: 02/16/2023 CLINICAL DATA:  Chest pain and cough. EXAM: CHEST - 2 VIEW COMPARISON:  12/10/2022 FINDINGS: Heart size and pulmonary vascularity are normal. Coarse interstitial changes in the lungs with mostly perihilar and basilar distribution. This is mildly progressed since prior study with focal infiltration seen in the right lung base. Mild bronchiectasis. Changes likely represent acute on chronic bronchitic changes with possible developing pneumonia. Subcentimeter nodule seen previously in the left upper lung is unchanged, likely calcified granuloma. No pleural effusion or pneumothorax. Mediastinal contours appear intact. Degenerative changes in the spine and shoulders. IMPRESSION: Bronchiectasis and increasing coarse central interstitial changes in the lungs with possible developing infiltrate in the right base. Changes likely to represent acute on chronic bronchitic changes with possible developing pneumonia. Electronically Signed   By: Elsie Gravely M.D.   On: 02/16/2023 22:59    Procedures Procedures    Medications Ordered in ED Medications  amiodarone  (PACERONE ) tablet 200 mg (has no administration in time range)  ezetimibe  (ZETIA ) tablet 10 mg (has no administration in time range)  metoprolol  succinate (TOPROL -XL) 24 hr tablet 25 mg (has no administration in time range)  rosuvastatin  (CRESTOR ) tablet 40 mg (has no administration in time range)   spironolactone  (ALDACTONE ) tablet 12.5 mg (has no administration in time range)  empagliflozin  (JARDIANCE ) tablet 10 mg (has no administration in time range)  pantoprazole  (PROTONIX ) EC tablet 40 mg (has no administration in time range)  apixaban  (ELIQUIS ) tablet 5 mg (has no administration in time range)  clopidogrel  (PLAVIX ) tablet 75 mg (has no administration in time range)  bisacodyl  (DULCOLAX) EC tablet 10 mg (has no administration in time range)  lactulose  (CHRONULAC ) 10 GM/15ML solution 30 g (has no administration in time range)  insulin  aspart (novoLOG ) injection 0-9 Units (has no administration in time range)  insulin  aspart (novoLOG ) injection 0-5 Units (has no administration in time range)  sodium chloride  flush (NS) 0.9 % injection 3 mL (has no administration in time range)  sodium chloride  flush (NS) 0.9 % injection 3 mL (has no administration in time range)  0.9 %  sodium chloride  infusion (has no administration in time range)  acetaminophen  (TYLENOL ) tablet 1,000 mg (has no administration in time range)  ondansetron  (ZOFRAN ) injection 4 mg (has no administration in time range)  furosemide  (LASIX ) injection 20 mg (has no administration in time range)  potassium chloride  SA (KLOR-CON  M) CR tablet 20 mEq (has no administration in time range)  albuterol  (PROVENTIL ) (2.5 MG/3ML) 0.083% nebulizer solution 2.5 mg (has no administration in time range)  cefTRIAXone  (ROCEPHIN ) 1 g in sodium chloride  0.9 % 100 mL IVPB (0 g Intravenous Stopped 02/17/23 0104)  benzonatate  (TESSALON ) capsule 100 mg (100 mg Oral Given 02/17/23 0203)  pantoprazole  (PROTONIX ) injection 40 mg (40 mg Intravenous Given 02/17/23 0203)  azithromycin  (ZITHROMAX ) tablet 500 mg (500 mg Oral Given 02/17/23 0203)  furosemide  (LASIX ) injection 20 mg (20 mg Intravenous Given 02/17/23 0407)    ED Course/ Medical Decision Making/ A&P                                 Medical Decision Making Amount and/or Complexity of Data  Reviewed Labs: ordered. Radiology: ordered.  Risk Prescription drug management. Decision regarding hospitalization.   Patient with history of ischemic cardiomyopathy here for evaluation of progressive chest pain, orthopnea and shortness of  breath and cough.  He does have decreased air movement in the right lung fields.  He has significant difficulty breathing and coughing when he tries to lay back to any extent.  Chest x-ray with possible pneumonia.  He was started on antibiotics although there is a low index of suspicion for pneumonia clinically.  Doubt PE, he is anticoagulated for A-fib.  Troponins are mildly elevated and flat, doubt ACS.SABRA  BNP is uptrending when compared to priors.  Overall he looks euvolemic.  Will treat with dose of Lasix .  Given how symptomatic he is discussed with cardiology-cardiologist will see the patient in consult.  Hospitalist consulted for admission for ongoing care.        Final Clinical Impression(s) / ED Diagnoses Final diagnoses:  Community acquired pneumonia of right lower lobe of lung  Acute on chronic congestive heart failure, unspecified heart failure type [facility])    Rx / DC Orders ED Discharge Orders     None         Griselda Norris, MD 02/17/23 [phone]

## 2023-02-17 ENCOUNTER — Encounter (HOSPITAL_COMMUNITY): Payer: Self-pay | Admitting: Internal Medicine

## 2023-02-17 ENCOUNTER — Observation Stay (HOSPITAL_COMMUNITY): Payer: MEDICAID

## 2023-02-17 DIAGNOSIS — N182 Chronic kidney disease, stage 2 (mild): Secondary | ICD-10-CM

## 2023-02-17 DIAGNOSIS — Z7901 Long term (current) use of anticoagulants: Secondary | ICD-10-CM | POA: Diagnosis not present

## 2023-02-17 DIAGNOSIS — Z79899 Other long term (current) drug therapy: Secondary | ICD-10-CM | POA: Diagnosis not present

## 2023-02-17 DIAGNOSIS — Z1152 Encounter for screening for COVID-19: Secondary | ICD-10-CM | POA: Diagnosis not present

## 2023-02-17 DIAGNOSIS — Z955 Presence of coronary angioplasty implant and graft: Secondary | ICD-10-CM | POA: Diagnosis not present

## 2023-02-17 DIAGNOSIS — Z833 Family history of diabetes mellitus: Secondary | ICD-10-CM | POA: Diagnosis not present

## 2023-02-17 DIAGNOSIS — I509 Heart failure, unspecified: Secondary | ICD-10-CM | POA: Diagnosis present

## 2023-02-17 DIAGNOSIS — E871 Hypo-osmolality and hyponatremia: Secondary | ICD-10-CM | POA: Diagnosis not present

## 2023-02-17 DIAGNOSIS — K5900 Constipation, unspecified: Secondary | ICD-10-CM | POA: Diagnosis present

## 2023-02-17 DIAGNOSIS — J470 Bronchiectasis with acute lower respiratory infection: Secondary | ICD-10-CM | POA: Diagnosis present

## 2023-02-17 DIAGNOSIS — I10 Essential (primary) hypertension: Secondary | ICD-10-CM

## 2023-02-17 DIAGNOSIS — I5023 Acute on chronic systolic (congestive) heart failure: Secondary | ICD-10-CM | POA: Diagnosis present

## 2023-02-17 DIAGNOSIS — Z841 Family history of disorders of kidney and ureter: Secondary | ICD-10-CM | POA: Diagnosis not present

## 2023-02-17 DIAGNOSIS — E785 Hyperlipidemia, unspecified: Secondary | ICD-10-CM | POA: Diagnosis present

## 2023-02-17 DIAGNOSIS — E118 Type 2 diabetes mellitus with unspecified complications: Secondary | ICD-10-CM

## 2023-02-17 DIAGNOSIS — I255 Ischemic cardiomyopathy: Secondary | ICD-10-CM

## 2023-02-17 DIAGNOSIS — E669 Obesity, unspecified: Secondary | ICD-10-CM | POA: Diagnosis present

## 2023-02-17 DIAGNOSIS — I5021 Acute systolic (congestive) heart failure: Secondary | ICD-10-CM

## 2023-02-17 DIAGNOSIS — Z7902 Long term (current) use of antithrombotics/antiplatelets: Secondary | ICD-10-CM | POA: Diagnosis not present

## 2023-02-17 DIAGNOSIS — I2510 Atherosclerotic heart disease of native coronary artery without angina pectoris: Secondary | ICD-10-CM | POA: Diagnosis present

## 2023-02-17 DIAGNOSIS — Z823 Family history of stroke: Secondary | ICD-10-CM | POA: Diagnosis not present

## 2023-02-17 DIAGNOSIS — Z6822 Body mass index (BMI) 22.0-22.9, adult: Secondary | ICD-10-CM | POA: Diagnosis not present

## 2023-02-17 DIAGNOSIS — J189 Pneumonia, unspecified organism: Secondary | ICD-10-CM | POA: Diagnosis present

## 2023-02-17 DIAGNOSIS — I480 Paroxysmal atrial fibrillation: Secondary | ICD-10-CM | POA: Diagnosis present

## 2023-02-17 DIAGNOSIS — I130 Hypertensive heart and chronic kidney disease with heart failure and stage 1 through stage 4 chronic kidney disease, or unspecified chronic kidney disease: Secondary | ICD-10-CM | POA: Diagnosis present

## 2023-02-17 DIAGNOSIS — Z7984 Long term (current) use of oral hypoglycemic drugs: Secondary | ICD-10-CM | POA: Diagnosis not present

## 2023-02-17 DIAGNOSIS — Z8249 Family history of ischemic heart disease and other diseases of the circulatory system: Secondary | ICD-10-CM | POA: Diagnosis not present

## 2023-02-17 DIAGNOSIS — E1122 Type 2 diabetes mellitus with diabetic chronic kidney disease: Secondary | ICD-10-CM | POA: Diagnosis present

## 2023-02-17 LAB — ECHOCARDIOGRAM COMPLETE
AR max vel: 1.85 cm2
AV Area VTI: 1.78 cm2
AV Area mean vel: 1.69 cm2
AV Mean grad: 3 mm[Hg]
AV Peak grad: 6.2 mm[Hg]
Ao pk vel: 1.24 m/s
Area-P 1/2: 4.8 cm2
Calc EF: 33.8 %
Est EF: 25
Height: 72 in
MV M vel: 3.72 m/s
MV Peak grad: 55.4 mm[Hg]
MV VTI: 1.41 cm2
S' Lateral: 4.3 cm
Single Plane A2C EF: 36.7 %
Single Plane A4C EF: 30.9 %
Weight: 2608 [oz_av]

## 2023-02-17 LAB — BASIC METABOLIC PANEL
Anion gap: 12 (ref 5–15)
BUN: 11 mg/dL (ref 8–23)
CO2: 25 mmol/L (ref 22–32)
Calcium: 9.6 mg/dL (ref 8.9–10.3)
Chloride: 100 mmol/L (ref 98–111)
Creatinine, Ser: 1.21 mg/dL (ref 0.61–1.24)
GFR, Estimated: >60 mL/min (ref >60)
Glucose, Bld: 201 mg/dL — ABNORMAL HIGH (ref 70–99)
Potassium: 3.9 mmol/L (ref 3.5–5.1)
Sodium: 137 mmol/L (ref 135–145)

## 2023-02-17 LAB — LACTIC ACID, PLASMA: Lactic Acid, Venous: 1.2 mmol/L (ref 0.5–1.9)

## 2023-02-17 LAB — PROCALCITONIN: Procalcitonin: <0.1 ng/mL

## 2023-02-17 LAB — GLUCOSE, CAPILLARY
Glucose-Capillary: 153 mg/dL — ABNORMAL HIGH (ref 70–99)
Glucose-Capillary: 202 mg/dL — ABNORMAL HIGH (ref 70–99)

## 2023-02-17 LAB — HEMOGLOBIN A1C
Hgb A1c MFr Bld: 7.8 % — ABNORMAL HIGH (ref 4.8–5.6)
Mean Plasma Glucose: 177.16 mg/dL

## 2023-02-17 LAB — HIV ANTIBODY (ROUTINE TESTING W REFLEX)
HIV Screen 4th Generation wRfx: NONREACTIVE
HIV Screen 4th Generation wRfx: NONREACTIVE

## 2023-02-17 LAB — CBG MONITORING, ED
Glucose-Capillary: 121 mg/dL — ABNORMAL HIGH (ref 70–99)
Glucose-Capillary: 159 mg/dL — ABNORMAL HIGH (ref 70–99)

## 2023-02-17 LAB — TROPONIN I (HIGH SENSITIVITY): Troponin I (High Sensitivity): 23 ng/L — ABNORMAL HIGH (ref <18)

## 2023-02-17 LAB — MAGNESIUM: Magnesium: 2.1 mg/dL (ref 1.7–2.4)

## 2023-02-17 MED ORDER — FUROSEMIDE 10 MG/ML IJ SOLN
20.0000 mg | Freq: Once | INTRAMUSCULAR | Status: AC
  Administered 2023-02-17: 20 mg via INTRAVENOUS
  Filled 2023-02-17: qty 2

## 2023-02-17 MED ORDER — INSULIN ASPART 100 UNIT/ML IJ SOLN: 0.0000 [IU] | Freq: Every day | INTRAMUSCULAR | Status: DC

## 2023-02-17 MED ORDER — ALBUTEROL SULFATE (2.5 MG/3ML) 0.083% IN NEBU: 2.5000 mg | INHALATION_SOLUTION | RESPIRATORY_TRACT | Status: DC | PRN

## 2023-02-17 MED ORDER — PANTOPRAZOLE SODIUM 40 MG IV SOLR
40.0000 mg | Freq: Once | INTRAVENOUS | Status: AC
  Administered 2023-02-17: 40 mg via INTRAVENOUS
  Filled 2023-02-17: qty 10

## 2023-02-17 MED ORDER — POTASSIUM CHLORIDE CRYS ER 20 MEQ PO TBCR
20.0000 meq | EXTENDED_RELEASE_TABLET | Freq: Every day | ORAL | Status: DC
  Administered 2023-02-17 – 2023-02-20 (×4): 20 meq via ORAL
  Filled 2023-02-17 (×4): qty 1

## 2023-02-17 MED ORDER — SODIUM CHLORIDE 0.9% FLUSH
3.0000 mL | Freq: Two times a day (BID) | INTRAVENOUS | Status: DC
  Administered 2023-02-17 – 2023-02-18 (×3): 3 mL via INTRAVENOUS

## 2023-02-17 MED ORDER — SPIRONOLACTONE 12.5 MG HALF TABLET
12.5000 mg | ORAL_TABLET | Freq: Every day | ORAL | Status: DC
  Administered 2023-02-17 – 2023-02-18 (×2): 12.5 mg via ORAL
  Filled 2023-02-17 (×2): qty 1

## 2023-02-17 MED ORDER — ZOLPIDEM TARTRATE 5 MG PO TABS
5.0000 mg | ORAL_TABLET | Freq: Every evening | ORAL | Status: DC | PRN
  Administered 2023-02-17 – 2023-02-19 (×3): 5 mg via ORAL
  Filled 2023-02-17 (×3): qty 1

## 2023-02-17 MED ORDER — CLOPIDOGREL BISULFATE 75 MG PO TABS
75.0000 mg | ORAL_TABLET | Freq: Every day | ORAL | Status: DC
  Administered 2023-02-17 – 2023-02-20 (×4): 75 mg via ORAL
  Filled 2023-02-17 (×4): qty 1

## 2023-02-17 MED ORDER — PANTOPRAZOLE SODIUM 40 MG PO TBEC
40.0000 mg | DELAYED_RELEASE_TABLET | Freq: Two times a day (BID) | ORAL | Status: DC
  Administered 2023-02-17 – 2023-02-20 (×7): 40 mg via ORAL
  Filled 2023-02-17 (×7): qty 1

## 2023-02-17 MED ORDER — EMPAGLIFLOZIN 10 MG PO TABS
10.0000 mg | ORAL_TABLET | Freq: Every day | ORAL | Status: DC
  Administered 2023-02-17 – 2023-02-20 (×4): 10 mg via ORAL
  Filled 2023-02-17 (×4): qty 1

## 2023-02-17 MED ORDER — AZITHROMYCIN 250 MG PO TABS
500.0000 mg | ORAL_TABLET | Freq: Once | ORAL | Status: AC
  Administered 2023-02-17: 500 mg via ORAL
  Filled 2023-02-17: qty 2

## 2023-02-17 MED ORDER — PERFLUTREN LIPID MICROSPHERE
1.0000 mL | INTRAVENOUS | Status: DC | PRN
  Administered 2023-02-17: 3 mL via INTRAVENOUS

## 2023-02-17 MED ORDER — FUROSEMIDE 10 MG/ML IJ SOLN
20.0000 mg | Freq: Two times a day (BID) | INTRAMUSCULAR | Status: DC
  Administered 2023-02-17 – 2023-02-19 (×4): 20 mg via INTRAVENOUS
  Filled 2023-02-17 (×4): qty 2

## 2023-02-17 MED ORDER — ACETAMINOPHEN 500 MG PO TABS: 1000.0000 mg | ORAL_TABLET | ORAL | Status: DC | PRN

## 2023-02-17 MED ORDER — LACTULOSE 10 GM/15ML PO SOLN
30.0000 g | Freq: Every day | ORAL | Status: DC
  Administered 2023-02-17 – 2023-02-20 (×3): 30 g via ORAL
  Filled 2023-02-17: qty 60
  Filled 2023-02-17 (×3): qty 45

## 2023-02-17 MED ORDER — INSULIN ASPART 100 UNIT/ML IJ SOLN
0.0000 [IU] | Freq: Three times a day (TID) | INTRAMUSCULAR | Status: DC
Start: 2023-02-17 — End: 2023-02-20
  Administered 2023-02-19: 9 [IU] via SUBCUTANEOUS

## 2023-02-17 MED ORDER — BISACODYL 5 MG PO TBEC
10.0000 mg | DELAYED_RELEASE_TABLET | Freq: Every day | ORAL | Status: DC
  Administered 2023-02-17 – 2023-02-20 (×3): 10 mg via ORAL
  Filled 2023-02-17 (×4): qty 2

## 2023-02-17 MED ORDER — BENZONATATE 100 MG PO CAPS
100.0000 mg | ORAL_CAPSULE | Freq: Once | ORAL | Status: AC
  Administered 2023-02-17: 100 mg via ORAL
  Filled 2023-02-17: qty 1

## 2023-02-17 MED ORDER — SODIUM CHLORIDE 0.9% FLUSH: 3.0000 mL | INTRAVENOUS | Status: DC | PRN

## 2023-02-17 MED ORDER — METOPROLOL SUCCINATE ER 25 MG PO TB24
25.0000 mg | ORAL_TABLET | Freq: Every day | ORAL | Status: DC
  Administered 2023-02-17: 25 mg via ORAL
  Filled 2023-02-17 (×2): qty 1

## 2023-02-17 MED ORDER — APIXABAN 5 MG PO TABS
5.0000 mg | ORAL_TABLET | Freq: Two times a day (BID) | ORAL | Status: DC
  Administered 2023-02-17 – 2023-02-20 (×7): 5 mg via ORAL
  Filled 2023-02-17 (×7): qty 1

## 2023-02-17 MED ORDER — ONDANSETRON HCL 4 MG/2ML IJ SOLN: 4.0000 mg | Freq: Four times a day (QID) | INTRAMUSCULAR | Status: DC | PRN

## 2023-02-17 MED ORDER — ROSUVASTATIN CALCIUM 20 MG PO TABS
40.0000 mg | ORAL_TABLET | Freq: Every day | ORAL | Status: DC
  Administered 2023-02-17 – 2023-02-20 (×4): 40 mg via ORAL
  Filled 2023-02-17 (×4): qty 2

## 2023-02-17 MED ORDER — SODIUM CHLORIDE 0.9 % IV SOLN: 250.0000 mL | INTRAVENOUS | Status: DC | PRN

## 2023-02-17 MED ORDER — AMIODARONE HCL 200 MG PO TABS
200.0000 mg | ORAL_TABLET | Freq: Every day | ORAL | Status: DC
  Administered 2023-02-17 – 2023-02-20 (×4): 200 mg via ORAL
  Filled 2023-02-17 (×4): qty 1

## 2023-02-17 MED ORDER — EZETIMIBE 10 MG PO TABS
10.0000 mg | ORAL_TABLET | Freq: Every day | ORAL | Status: DC
  Administered 2023-02-17 – 2023-02-20 (×4): 10 mg via ORAL
  Filled 2023-02-17 (×4): qty 1

## 2023-02-17 NOTE — H&P (Signed)
 History and Physical    Garrett Roberts FMW:[id number] DOB: Jun 07, 1955 DOA: 02/16/2023  DOS: the patient was seen and examined on 02/16/2023  PCP: Pcp, No   Patient coming from: Home  I have personally briefly reviewed patient's old medical records in [facility]  HPI: 68 yo [place]ese male with hx of ischemic cardiomyopathy, chronic systolic CHF LVEF 20%, STEMI in 11-2022, GERD, constipation, presents to ER with 2 weeks of subtle SOB, 2-3 days of orthopnea, cough and productive clear sputum. Pt has been losing weight since his heart attach in 11-2022. He has lost 12 lbs. Has noticed some swelling in his legs (L>R). He has not taken any lasix  because he says he does not urinate more with 20 mg .  Pt has spent the last 2 night trying to sleep upright on his cough. He states everytime he tries to lie down flat he starts to cough violently.  Sputum is clear in color. No pink, bloody or green/yellow colors.  Denies any fever or chills. Does complain of worsening SOB.  Pt states he was told he was on a 2000 ml per day fluid restriction.  On arrival to ER, temp 97.5, HH 74, BP 110/75. 100% on RA  Significant Events: Admitted 02/16/2023 for acute on chronic systolic CHF   Significant Labs: BNP 170 WBC 5.8, HgB 14.6, Plt 280 Na 134, K 4.3, BUN 13, Scr 1.1  Significant Imaging Studies: CXR Bronchiectasis and increasing coarse central interstitial changes in the lungs with possible developing infiltrate in the right base. Changes likely to represent acute on chronic bronchitic changes with possible developing pneumonia.  Antibiotic Therapy: Anti-infectives (From admission, onward)    Start     Dose/Rate Route Frequency Ordered Stop   02/17/23 0200  azithromycin  (ZITHROMAX ) tablet 500 mg        500 mg Oral  Once 02/17/23 0157 02/17/23 0203   02/16/23 2345  cefTRIAXone  (ROCEPHIN ) 1 g in sodium chloride  0.9 % 100 mL IVPB        1 g 200 mL/hr over 30 Minutes Intravenous  Once  02/16/23 2342 02/17/23 0104       Procedures:   Consultants: EDP has consulted cardiology - fellow bandarage    ED Course: BNP elevated to 700s. Given 20 mg IV lasix .  Review of Systems:  Review of Systems  Constitutional:  Positive for weight loss. Negative for chills and fever.  HENT: Negative.    Eyes: Negative.   Respiratory:  Positive for cough, sputum production and shortness of breath.   Cardiovascular:  Positive for leg swelling.       Chest tightness/SOB  Gastrointestinal:  Positive for constipation. Negative for nausea and vomiting.  Genitourinary: Negative.   Musculoskeletal: Negative.   Skin: Negative.   Neurological: Negative.   Endo/Heme/Allergies: Negative.   Psychiatric/Behavioral: Negative.    All other systems reviewed and are negative.   Past Medical History:  Diagnosis Date   Acute ST elevation myocardial infarction (STEMI) of anterior wall (HCC) 12/10/2022   Acute ST elevation myocardial infarction (STEMI) of inferior wall (HCC) 10/30/2021   Atrial fibrillation (HCC)    Atrial fibrillation with RVR (HCC) 12/19/2022   CHF (congestive heart failure) (HCC)    Coronary artery disease    Diabetes mellitus without complication (HCC)    DM (diabetes mellitus) (HCC)    H. pylori infection    Heart attack (HCC)    HLD (hyperlipidemia)    Hypertension    ST elevation myocardial infarction involving left  anterior descending (LAD) coronary artery (HCC) 12/10/2022    Past Surgical History:  Procedure Laterality Date   CARDIOVERSION N/A 12/14/2022   Procedure: CARDIOVERSION;  Surgeon: Garrett Lonni CROME, MD;  Location: [facility] INVASIVE CV LAB;  Service: Cardiovascular;  Laterality: N/A;   CORONARY/GRAFT ACUTE MI REVASCULARIZATION N/A 10/30/2021   Procedure: Coronary/Graft Acute MI Revascularization;  Surgeon: Ladona Heinz, MD;  Location: [facility] INVASIVE CV LAB;  Service: Cardiovascular;  Laterality: N/A;   CORONARY/GRAFT ACUTE MI REVASCULARIZATION N/A 12/10/2022    Procedure: Coronary/Graft Acute MI Revascularization;  Surgeon: Garrett Sharper, MD;  Location: [facility] INVASIVE CV LAB;  Service: Cardiovascular;  Laterality: N/A;   LEFT HEART CATH AND CORONARY ANGIOGRAPHY N/A 10/30/2021   Procedure: LEFT HEART CATH AND CORONARY ANGIOGRAPHY;  Surgeon: Ladona Heinz, MD;  Location: [facility] INVASIVE CV LAB;  Service: Cardiovascular;  Laterality: N/A;   LEFT HEART CATH AND CORONARY ANGIOGRAPHY N/A 12/10/2022   Procedure: LEFT HEART CATH AND CORONARY ANGIOGRAPHY;  Surgeon: Garrett Sharper, MD;  Location: [facility] INVASIVE CV LAB;  Service: Cardiovascular;  Laterality: N/A;     reports that he has never smoked. He has never used smokeless tobacco. He reports that he does not drink alcohol and does not use drugs.  Allergies  Allergen Reactions   Beef-Derived Drug Products Other (See Comments)    Religious reasons   Pork-Derived Products     Religious reasons    Family History  Problem Relation Age of Onset   Diabetes Mother    Chronic Renal Failure Father    Diabetes Father    Chronic Renal Failure Brother    Diabetes Brother    Diabetes Brother    Heart attack Brother    Stroke Brother     Prior to Admission medications   Medication Sig Start Date End Date Taking? Authorizing Provider  amiodarone  (PACERONE ) 200 MG tablet Take 1 tablet (200 mg total) by mouth daily. 02/06/23   Rolan Ezra RAMAN, MD  apixaban  (ELIQUIS ) 5 MG TABS tablet Take 1 tablet (5 mg total) by mouth 2 (two) times daily. 01/16/23   Garrett Roberts Mickey., NP  Ascorbic Acid (VITAMIN C PO) Take 1 tablet by mouth daily.    [provider]  clopidogrel  (PLAVIX ) 75 MG tablet Take 1 tablet (75 mg total) by mouth daily. 12/27/22   Garrett Manuelita Garre, PA-C  Cyanocobalamin  (B-12 PO) Take 1 tablet by mouth daily.    [provider]  empagliflozin  (JARDIANCE ) 10 MG TABS tablet Take 1 tablet (10 mg total) by mouth daily before breakfast. 01/16/23   Garrett Roberts Mickey., NP  ezetimibe  (ZETIA ) 10 MG  tablet Take 1 tablet (10 mg total) by mouth daily. 12/27/22   Garrett Manuelita Garre, PA-C  furosemide  (LASIX ) 20 MG tablet Take 1 tablet (20 mg total) by mouth daily as needed (weight gain (3lbs in 1 day or 5lbs in 1 week) or worsening swelling). 12/27/22 12/27/23  Garrett Manuelita Garre, PA-C  MAGNESIUM PO Take 1 tablet by mouth daily.    [provider]  Menaquinone-7 (K2 PO) Take 1 tablet by mouth daily.    [provider]  metFORMIN  (GLUCOPHAGE ) 500 MG tablet Take 1 tablet (500 mg total) by mouth daily with breakfast. Courtesy fill- Additional refills will need to come from PCP 02/10/23   Rolan Ezra RAMAN, MD  metoprolol  succinate (TOPROL -XL) 25 MG 24 hr tablet Take 1 tablet (25 mg total) by mouth daily. 12/27/22   Garrett Manuelita Garre, PA-C  Multiple Vitamin (MULTIVITAMIN PO) Take  1 tablet by mouth daily.    [provider]  Multiple Vitamins-Minerals (ZINC PO) Take 1 tablet by mouth daily.    [provider]  nitroGLYCERIN  (NITROSTAT ) 0.4 MG SL tablet Place 1 tablet (0.4 mg total) under the tongue every 5 (five) minutes x 3 doses as needed for chest pain. 12/15/22   Henry Manuelita NOVAK, NP  pantoprazole  (PROTONIX ) 40 MG tablet Take 1 tablet (40 mg total) by mouth 2 (two) times daily before a meal. 02/06/23   Rolan Ezra RAMAN, MD  rosuvastatin  (CRESTOR ) 40 MG tablet Take 1 tablet (40 mg total) by mouth daily. 02/10/23 05/11/23  Rolan Ezra RAMAN, MD  sacubitril-valsartan (ENTRESTO ) 24-26 MG Take 1 tablet by mouth 2 (two) times daily. 12/27/22   Garrett Manuelita Garre, PA-C  spironolactone  (ALDACTONE ) 25 MG tablet Take 1/2 tablets (12.5 mg total) by mouth daily. 02/06/23   McLean, Dalton S, MD  VITAMIN D PO Take 1 tablet by mouth daily.    [provider]    Physical Exam: Vitals:   02/17/23 0315 02/17/23 0330 02/17/23 0345 02/17/23 0400  BP: 98/73 100/71 96/68 106/73  Pulse: 75 71 67 76  Resp: (!) 25 18 (!) 22 (!) 23  Temp:      TempSrc:      SpO2: 97%  100% 100% 100%  Weight:      Height:        Physical Exam Vitals and nursing note reviewed.  Constitutional:      General: He is not in acute distress.    Appearance: Normal appearance. He is normal weight. He is not toxic-appearing or diaphoretic.  HENT:     Head: Normocephalic and atraumatic.     Nose: Nose normal.  Eyes:     General: No scleral icterus. Cardiovascular:     Rate and Rhythm: Normal rate and regular rhythm.     Pulses: Normal pulses.     Comments: No JVD Pulmonary:     Effort: Pulmonary effort is normal. No respiratory distress.     Breath sounds: Normal breath sounds.     Comments: Coarse BS bilaterally Abdominal:     General: Abdomen is flat. Bowel sounds are normal.     Palpations: Abdomen is soft.  Musculoskeletal:     Comments: Trace left ankle/pedal edema Trace right pedal edema  Skin:    General: Skin is warm and dry.     Capillary Refill: Capillary refill takes less than 2 seconds.  Neurological:     General: No focal deficit present.     Mental Status: He is alert and oriented to person, place, and time.    Labs on Admission: I have personally reviewed following labs and imaging studies  CBC: Recent Labs  Lab 02/16/23 2233  WBC 5.8  HGB 14.6  HCT 46.0  MCV 91.3  PLT 280   Basic Metabolic Panel: Recent Labs  Lab 02/16/23 2233  NA 134*  K 4.3  CL 101  CO2 23  GLUCOSE 193*  BUN 13  CREATININE 1.10  CALCIUM  9.5   GFR: Estimated Creatinine Clearance: 68.1 mL/min (by C-G formula based on SCr of 1.1 mg/dL). Cardiac Enzymes: Recent Labs  Lab 02/16/23 2233 02/17/23 0020  TROPONINIHS 23* 23*   BNP (last 3 results) Recent Labs    12/27/22 1526 02/06/23 1025 02/16/23 2233  BNP 349.9* 586.4* 710.8*   Radiological Exams on Admission: I have personally reviewed images DG Chest 2 View Result Date: 02/16/2023 CLINICAL DATA:  Chest pain  and cough. EXAM: CHEST - 2 VIEW COMPARISON:  12/10/2022 FINDINGS: Heart size and pulmonary  vascularity are normal. Coarse interstitial changes in the lungs with mostly perihilar and basilar distribution. This is mildly progressed since prior study with focal infiltration seen in the right lung base. Mild bronchiectasis. Changes likely represent acute on chronic bronchitic changes with possible developing pneumonia. Subcentimeter nodule seen previously in the left upper lung is unchanged, likely calcified granuloma. No pleural effusion or pneumothorax. Mediastinal contours appear intact. Degenerative changes in the spine and shoulders. IMPRESSION: Bronchiectasis and increasing coarse central interstitial changes in the lungs with possible developing infiltrate in the right base. Changes likely to represent acute on chronic bronchitic changes with possible developing pneumonia. Electronically Signed   By: Elsie Gravely M.D.   On: 02/16/2023 22:59   EKG: My personal interpretation of EKG shows: NSR   Assessment/Plan Principal Problem:   Acute on chronic systolic CHF (congestive heart failure) (HCC) Active Problems:   CAD (coronary artery disease)   Essential hypertension   Hyperlipidemia   Constipation   PAF (paroxysmal atrial fibrillation) (HCC)   Type 2 diabetes mellitus with complication, without long-term current use of insulin  (HCC)   Ischemic cardiomyopathy   CKD (chronic kidney disease), stage II    Assessment and Plan: * Acute on chronic systolic CHF (congestive heart failure) (HCC) Observation card/tele bed. Check echo. BP borderline low. Extremities are warm. No JVD. Coarse BS but no overt rales. EDP ordered 20 mg IV lasix . EDP has consulted cardiology fellow Bandarage. Will hold entresto  to give BP more room for diuresis. Continue with toprol -xl. Check echo. Doubt think he is in cardiogenic shock. Pt has not been taking lasix  as it is only prescribed as needed.  Pt's also need to have increased fluid restriction. Pt states he was told by cardiology office he is on 2000 ml  fluid restriction per day. Continue with lasix  20 mg IV BID. I don't think he has pneumonia. Pt was given 1 dose of IV aBX by EDP. Will check procalcitonin. Pt is negatie for flu/rsv/covid.  CKD (chronic kidney disease), stage II Stable. Monitor Scr during diuresis.  Ischemic cardiomyopathy Chronic. Repeat echo. No chest pain. Pt is not taking any long acting nitrates. Is not using his SL NTG.  Type 2 diabetes mellitus with complication, without long-term current use of insulin  (HCC) Hold glucophage . Continue jardiance . Add SSI.  PAF (paroxysmal atrial fibrillation) (HCC) Continue Bid eliquis  and amiodarone .  Constipation Pt states that miralax  is not that effective for his constipation. Will change to po lactulose  and dulcolax.  Hyperlipidemia Continue crestor  40 mg daily.  Essential hypertension Hold entresto  for now to give BP more room for diuresis.  CAD (coronary artery disease) Stable. Continue plavix  75 mg daily. Continue crestor  40 mg daily.   DVT prophylaxis: Eliquis  Code Status: Full Code Family Communication: no family at bedside. Pt is decisional.  Disposition Plan: return home  Consults called: EDP has consulted cardiology  Admission status: Observation, Telemetry bed   Camellia Door, DO [facility] 02/17/2023, 4:40 AM

## 2023-02-17 NOTE — Progress Notes (Signed)
 Heart Failure Navigator Progress Note  Assessed for Heart & Vascular TOC clinic readiness.  Patient does not meet criteria due to Advanced Heart Failure Team patient of Dr. Shirlee Latch.   Navigator will sign off at this time.    Rhae Hammock, BSN, Scientist, clinical (histocompatibility and immunogenetics) Only

## 2023-02-17 NOTE — Assessment & Plan Note (Signed)
 Hold glucophage . Continue jardiance . Add SSI.

## 2023-02-17 NOTE — Progress Notes (Signed)
 Echocardiogram 2D Echocardiogram has been performed.  Alix Aquas Leora Platt 02/17/2023, 2:34 PM

## 2023-02-17 NOTE — Assessment & Plan Note (Signed)
 Continue Bid eliquis  and amiodarone .

## 2023-02-17 NOTE — Assessment & Plan Note (Signed)
 Chronic. Repeat echo. No chest pain. Pt is not taking any long acting nitrates. Is not using his SL NTG.

## 2023-02-17 NOTE — Assessment & Plan Note (Signed)
 Stable. Continue plavix  75 mg daily. Continue crestor  40 mg daily.

## 2023-02-17 NOTE — Progress Notes (Signed)
 CCDM called for pt experiencing 9 beats of Vtach. Patient asymptomatic. V/S: 94/65 (74), HR 80, 100% RA. Patient educated regarding rhythm and provider was notified.

## 2023-02-17 NOTE — Progress Notes (Signed)
 Same day note  68 yo [place] male with past medical history of of ischemic cardiomyopathy, chronic systolic CHF LVEF 20%, STEMI in 11-2022, GERD, constipation, presented to ER with 2 weeks of  SOB, 2-3 days of orthopnea, cough and productive clear sputum. Pt has been losing weight since his heart attack in 11-2022. He has lost 12 lbs. Has noticed some swelling in his legs (L>R). He has not taken any lasix . Pt has spent the last 2 night trying to sleep upright on his couch.  In the ED patient had stable vitals.  Initial labs showed mild hyponatremia with sodium of 134.  Creatinine was 1.1.  Procalcitonin was less than 0.1 hemoglobin was 14.6.  BNP was slightly elevated at 170.  EKG showed normal sinus rhythm.  Chest x-ray showed Bronchiectasis and increasing coarse central interstitial changes in the lungs with possible developing infiltrate in the right base. Changes likely to represent acute on chronic bronchitic changes with possible developing pneumonia.  Patient was given Rocephin  and Zithromax  in the ED and was considered for admission to the hospital for further evaluation and treatment.  Patient seen and examined at bedside.  Patient was admitted to the hospital for shortness of breath orthopnea with leg swelling.  At the time of my evaluation, patient complains of weakness, shortness of breath especially lying down position with some foot edema.  Physical examination reveals obese built male, Communicative, not in overt distress, decreased breath sounds bilaterally.  Blood pressure marginally low.  Lower extremity edema noted.  Laboratory data and imaging was reviewed  Assessment and Plan.  Acute on chronic systolic CHF  Continue CHF protocol.  Check repeat 2D echocardiogram.  Received 20 mg of IV Lasix  in the ED.  Cardiology was consulted and will follow recommendations.  Entresto  on hold.  Continue Toprol -XL, spironolactone .  Was recently seen by cardiology office and was put on 2 L fluid  restrictions per day.  Review of previous 2D echocardiogram from 12/2022 showed LV ejection fraction of 20 to 25% with regional wall motion abnormality and grade 2 diastolic dysfunction.   CKD (chronic kidney disease), stage II Serum creatinine of 1.1.  Will continue to monitor while on diuretics.   Ischemic cardiomyopathy/CAD No active chest pain.  Continue Plavix  Crestor , metoprolol    Type 2 diabetes mellitus with complication, without long-term current use of insulin  (HCC) Continue to hold the Glucophage .  Continue jardiance , SSI.  Latest hemoglobin A1c of 7.8.   PAF (paroxysmal atrial fibrillation Continue Bid eliquis , metoprolol  and amiodarone .  Rate controlled at this time   Constipation Continue lactulose  and Dulcolax   Hyperlipidemia Continue crestor  and ezetimibe    Essential hypertension Continue to hold Entresto  due to borderline low blood pressure.  Continue metoprolol  spironolactone  blood pressure is still on the lower margin.  Might limit diuretics.    No Charge  Signed,  Vernal Anselm Alstrom, MD [facility]

## 2023-02-17 NOTE — Assessment & Plan Note (Signed)
Stable. Monitor Scr during diuresis.

## 2023-02-17 NOTE — Assessment & Plan Note (Signed)
Continue crestor 40mg daily. 

## 2023-02-17 NOTE — Assessment & Plan Note (Signed)
 Hold entresto  for now to give BP more room for diuresis.

## 2023-02-17 NOTE — Hospital Course (Addendum)
 Same day note    68 yo [place] male with past medical history of of ischemic cardiomyopathy, chronic systolic CHF LVEF 20%, STEMI in 11-2022, GERD, constipation, presented to ER with 2 weeks of  SOB, 2-3 days of orthopnea, cough and productive clear sputum. Pt has been losing weight since his heart attack in 11-2022. He has lost 12 lbs. Has noticed some swelling in his legs (L>R). He has not taken any lasix . Pt has spent the last 2 night trying to sleep upright on his couch.  In the ED patient had stable vitals.  Initial labs showed mild hyponatremia with sodium of 134.  Creatinine was 1.1.  Procalcitonin was less than 0.1 hemoglobin was 14.6.  BNP was slightly elevated at 170.  EKG showed normal sinus rhythm.  Chest x-ray showed Bronchiectasis and increasing coarse central interstitial changes in the lungs with possible developing infiltrate in the right base. Changes likely to represent acute on chronic bronchitic changes with possible developing pneumonia.  Patient was given Rocephin  and Zithromax  in the ED and was considered for admission to the hospital for further evaluation and treatment.  Patient seen and examined at bedside.  Patient was admitted to the hospital for  At the time of my evaluation, patient complains of  Physical examination reveals  Laboratory data and imaging was reviewed  Assessment and Plan.  Acute on chronic systolic CHF (congestive heart failure)  Continue CHF protocol.  Check repeat 2D echocardiogram.  Received 20 mg of IV Lasix  in the ED.  Cardiology was consulted.  Entresto  on hold.  Continue Toprol -XL, spironolactone .  Was recently seen by cardiology office and was put on 2 L fluid restrictions per day.  Review of previous 2D echocardiogram from 12/2022 showed LV ejection fraction of 20 to 25% with regional wall motion abnormality and grade 2 diastolic dysfunction.   CKD (chronic kidney disease), stage II Serum creatinine of 1.1.  Will continue to monitor while  on diuretics.   Ischemic cardiomyopathy/CAD No active chest pain.  Continue Plavix  Crestor , metoprolol    Type 2 diabetes mellitus with complication, without long-term current use of insulin  (HCC) Continue to hold the Glucophage .  Continue jardiance , SSI.  Latest hemoglobin A1c of 7.8.   PAF (paroxysmal atrial fibrillation Continue Bid eliquis , metoprolol  and amiodarone .  Rate controlled at this time   Constipation Continue lactulose  and Dulcolax   Hyperlipidemia Continue crestor  and ezetimibe    Essential hypertension Continue to hold Entresto  due to borderline low blood pressure.  Continue metoprolol  spironolactone     No Charge  Signed,  Vernal Anselm Alstrom, MD [facility]

## 2023-02-17 NOTE — Consult Note (Addendum)
 Cardiology Consultation   Patient ID: Garrett Roberts MRN: [id number]; DOB: 1955/07/12  Admit date: 02/16/2023 Date of Consult: 02/17/2023  PCP:  Freddrick No   [facility] Providers Cardiologist:  [name], MD  Electrophysiologist:  Fonda Kitty, MD       Patient Profile:   Garrett Roberts is a 68 y.o. male with a hx of  HTN, HLD, DM2, Mobitz I, inferior STEMI (10/30/21) and anterior STEMI (12/10/22) who is being seen 02/17/2023 for the evaluation of HF exacerbation at the request of Dr. Griselda.  History of Present Illness:   Garrett Roberts with a hx of  HTN, HLD, DM2, Mobitz I, inferior STEMI (10/30/21) and anterior STEMI s/p DES to pLAD (12/10/22), AF (on amiodarone , apixaban ; s/p dccv), who was admitted for a HF exacerbation.   Patient was previously admitted from 12/10/22-12/15/22 for an anterior STEMI. LHC demonstrated total occlusion of the proximal LAD s/p DES with an occluded D2, patent prior mid-RCA stent and moderate distal RCA stenosis, severe stenosis of the tortous ramus and severe diffuse LCX stenosis. He was discharged on asa/ticagrelor . His post-cath course was c/b AF s/p cardioversion; he was started on apixaban  and amiodarone . TTE prior to discharge demonstrated an LVEF 20-25% with moderately reduced RV function.    Patient presented today with intermittent chest pain since discharge. Today he had one episode of chest pain that lasted 3 hours; felt central, pressure, 5/10, that self-resolved while at rest. However, he is predominantly complaining of a productive cough that worsens with any positional change. He reports his breathing is worse when he is sleeping or laying flat. He denies any sick contacts. He was prescribed lasix  20mg  every day prn at discharge; but is somewhat unsure of his meds as his daughter doses them. He reports taking lasix  daily for 1 week due to mild pedal edema with some improvement, but has not taken it in the past 4 days. He  denies any exertional chest pain or shortness of breath. Denies any fevers, chills, nausea, vomiting. He otherwise reports adherence to medications and reports he has been losing weight.   In the ER: VS: 97.62F, BP 110/75, HR 75, RR 17, Sat 100% on RA Labs notable for: Trop 23->23, BNP 710.8 (586.4 on 02/06/23, 349.9 on 12/27/22). Resp panel negative for COVID, Flu A/B, RSV. CXR concerning for bronchiectasis and increasing interstitial changes in the lungs with possible infiltrate in the right base that may represent possible developing pneumonia.   Weight 73.9Kg on admission; appears to have been 73.3kg on 02/06/23   Past Medical History:  Diagnosis Date   Atrial fibrillation (HCC)    CHF (congestive heart failure) (HCC)    Coronary artery disease    Diabetes mellitus without complication (HCC)    DM (diabetes mellitus) (HCC)    H. pylori infection    Heart attack (HCC)    HLD (hyperlipidemia)    Hypertension     Past Surgical History:  Procedure Laterality Date   CARDIOVERSION N/A 12/14/2022   Procedure: CARDIOVERSION;  Surgeon: Kate Lonni CROME, MD;  Location: [facility] INVASIVE CV LAB;  Service: Cardiovascular;  Laterality: N/A;   CORONARY/GRAFT ACUTE MI REVASCULARIZATION N/A 10/30/2021   Procedure: Coronary/Graft Acute MI Revascularization;  Surgeon: Ladona Heinz, MD;  Location: [facility] INVASIVE CV LAB;  Service: Cardiovascular;  Laterality: N/A;   CORONARY/GRAFT ACUTE MI REVASCULARIZATION N/A 12/10/2022   Procedure: Coronary/Graft Acute MI Revascularization;  Surgeon: Wonda Sharper, MD;  Location: [facility] INVASIVE CV LAB;  Service: Cardiovascular;  Laterality:  N/A;   LEFT HEART CATH AND CORONARY ANGIOGRAPHY N/A 10/30/2021   Procedure: LEFT HEART CATH AND CORONARY ANGIOGRAPHY;  Surgeon: Ladona Heinz, MD;  Location: [facility] INVASIVE CV LAB;  Service: Cardiovascular;  Laterality: N/A;   LEFT HEART CATH AND CORONARY ANGIOGRAPHY N/A 12/10/2022   Procedure: LEFT HEART CATH AND CORONARY ANGIOGRAPHY;   Surgeon: Wonda Sharper, MD;  Location: [facility] INVASIVE CV LAB;  Service: Cardiovascular;  Laterality: N/A;     Home Medications:  Prior to Admission medications   Medication Sig Start Date End Date Taking? Authorizing Provider  amiodarone  (PACERONE ) 200 MG tablet Take 1 tablet (200 mg total) by mouth daily. 02/06/23   Rolan Ezra RAMAN, MD  apixaban  (ELIQUIS ) 5 MG TABS tablet Take 1 tablet (5 mg total) by mouth 2 (two) times daily. 01/16/23   Wyn Jackee VEAR Mickey., NP  Ascorbic Acid (VITAMIN C PO) Take 1 tablet by mouth daily.    [provider]  clopidogrel  (PLAVIX ) 75 MG tablet Take 1 tablet (75 mg total) by mouth daily. 12/27/22   Colletta Manuelita Garre, PA-C  Cyanocobalamin  (B-12 PO) Take 1 tablet by mouth daily.    [provider]  empagliflozin  (JARDIANCE ) 10 MG TABS tablet Take 1 tablet (10 mg total) by mouth daily before breakfast. 01/16/23   Wyn Jackee VEAR Mickey., NP  ezetimibe  (ZETIA ) 10 MG tablet Take 1 tablet (10 mg total) by mouth daily. 12/27/22   Colletta Manuelita Garre, PA-C  furosemide  (LASIX ) 20 MG tablet Take 1 tablet (20 mg total) by mouth daily as needed (weight gain (3lbs in 1 day or 5lbs in 1 week) or worsening swelling). 12/27/22 12/27/23  Colletta Manuelita Garre, PA-C  MAGNESIUM PO Take 1 tablet by mouth daily.    [provider]  Menaquinone-7 (K2 PO) Take 1 tablet by mouth daily.    [provider]  metFORMIN  (GLUCOPHAGE ) 500 MG tablet Take 1 tablet (500 mg total) by mouth daily with breakfast. Courtesy fill- Additional refills will need to come from PCP 02/10/23   Rolan Ezra RAMAN, MD  metoprolol  succinate (TOPROL -XL) 25 MG 24 hr tablet Take 1 tablet (25 mg total) by mouth daily. 12/27/22   Colletta Manuelita Garre, PA-C  Multiple Vitamin (MULTIVITAMIN PO) Take 1 tablet by mouth daily.    [provider]  Multiple Vitamins-Minerals (ZINC PO) Take 1 tablet by mouth daily.    [provider]  nitroGLYCERIN  (NITROSTAT ) 0.4 MG SL tablet Place  1 tablet (0.4 mg total) under the tongue every 5 (five) minutes x 3 doses as needed for chest pain. 12/15/22   Henry Manuelita NOVAK, NP  pantoprazole  (PROTONIX ) 40 MG tablet Take 1 tablet (40 mg total) by mouth 2 (two) times daily before a meal. 02/06/23   Rolan Ezra RAMAN, MD  rosuvastatin  (CRESTOR ) 40 MG tablet Take 1 tablet (40 mg total) by mouth daily. 02/10/23 05/11/23  Rolan Ezra RAMAN, MD  sacubitril-valsartan (ENTRESTO ) 24-26 MG Take 1 tablet by mouth 2 (two) times daily. 12/27/22   Colletta Manuelita Garre, PA-C  spironolactone  (ALDACTONE ) 25 MG tablet Take 1/2 tablets (12.5 mg total) by mouth daily. 02/06/23   McLean, Dalton S, MD  VITAMIN D PO Take 1 tablet by mouth daily.    [provider]    Inpatient Medications: Scheduled Meds:  Continuous Infusions:  PRN Meds:   Allergies:    Allergies  Allergen Reactions   Beef-Derived Drug Products Other (See Comments)    Religious reasons   Pork-Derived Products  Religious reasons    Social History:   Social History   Socioeconomic History   Marital status: Married    Spouse name: Amani   Number of children: 5   Years of education: Not on file   Highest education level: High school graduate  Occupational History   Occupation: part time  Tobacco Use   Smoking status: Never   Smokeless tobacco: Never  Vaping Use   Vaping status: Never Used  Substance and Sexual Activity   Alcohol use: No   Drug use: No   Sexual activity: Not on file  Other Topics Concern   Not on file  Social History Narrative   ** Merged History Encounter **       Social Drivers of Health   Financial Resource Strain: High Risk (12/13/2022)   Overall Financial Resource Strain (CARDIA)    Difficulty of Paying Living Expenses: Very hard  Food Insecurity: No Food Insecurity (12/10/2022)   Hunger Vital Sign    Worried About Running Out of Food in the Last Year: Never true    Ran Out of Food in the Last Year: Never true  Transportation Needs:  No Transportation Needs (12/10/2022)   PRAPARE - Administrator, Civil Service (Medical): No    Lack of Transportation (Non-Medical): No  Physical Activity: Sufficiently Active (11/27/2019)   Received from [facility], [facility]   Exercise Vital Sign    Days of Exercise per Week: 4 days    Minutes of Exercise per Session: 60 min  Stress: Stress Concern Present (11/27/2019)   Received from [facility], [facility] - Occupational Stress Questionnaire    Feeling of Stress : To some extent  Social Connections: Unknown (05/25/2021)   Received from [facility], [facility]   Social Network    Social Network: Not on file  Intimate Partner Violence: Not At Risk (12/10/2022)   Humiliation, Afraid, Rape, and Kick questionnaire    Fear of Current or Ex-Partner: No    Emotionally Abused: No    Physically Abused: No    Sexually Abused: No    Family History:   Family History  Problem Relation Age of Onset   Diabetes Mother    Chronic Renal Failure Father    Diabetes Father    Chronic Renal Failure Brother    Diabetes Brother    Diabetes Brother    Heart attack Brother    Stroke Brother      ROS:  Please see the history of present illness.  All other ROS reviewed and negative.     Physical Exam/Data:   Vitals:   02/16/23 2222 02/16/23 2225 02/16/23 2226 02/17/23 0156  BP: 110/75   96/66  Pulse: 75   68  Resp: 17   18  Temp: (!) 97.5 F (36.4 C)   98 F (36.7 C)  TempSrc:    Oral  SpO2: 100% 100%  100%  Weight:   73.9 kg   Height:   6' (1.829 m)    No intake or output data in the 24 hours ending 02/17/23 0413    02/16/2023   10:26 PM 02/06/2023    9:34 AM 01/20/2023   10:24 AM  Last 3 Weights  Weight (lbs) 163 lb 161 lb 9.6 oz 169 lb 9.6 oz  Weight (kg) 73.936 kg 73.301 kg 76.93 kg     Body mass index is 22.11 kg/m.  General:  Well nourished, well developed, in  no acute distress HEENT: normal Neck: no  JVD Vascular: No carotid bruits; Distal pulses 2+ bilaterally Cardiac:  normal S1, S2; RRR; no murmur  Lungs:  clear to auscultation bilaterally, no wheezing, rhonchi or rales  Abd: soft, nontender, no hepatomegaly  Ext: no peripheral edema Musculoskeletal:  No deformities, BUE and BLE strength normal and equal Skin: warm and dry  Neuro:   no focal abnormalities noted Psych:  Normal affect   Relevant CV Studies: EKG 02/16/23    Tte 12/11/22  1. Mid septum to apex is akinetic consistent with LAD infarction. No LV apical thrombus on contrast imaging. Left ventricular ejection fraction, by estimation, is 20 to 25%. The left ventricle has severely decreased function. The left ventricle demonstrates regional wall motion abnormalities (see scoring diagram/findings for description). There is moderate concentric left ventricular hypertrophy. Left ventricular diastolic parameters are consistent with Grade II diastolic dysfunction  (pseudonormalization).   2. Right ventricular systolic function is moderately reduced. The right ventricular size is normal.   3. The mitral valve is grossly normal. Mild mitral valve regurgitation. No evidence of mitral stenosis.   4. The aortic valve is tricuspid. Aortic valve regurgitation is trivial. No aortic stenosis is present.   5. Aortic dilatation noted. There is mild dilatation of the ascending aorta, measuring 42 mm.   6. The inferior vena cava is dilated in size with >50% respiratory variability, suggesting right atrial pressure of 8 mmHg.  Laboratory Data:  High Sensitivity Troponin:   Recent Labs  Lab 02/16/23 2233 02/17/23 0020  TROPONINIHS 23* 23*     Chemistry Recent Labs  Lab 02/16/23 2233  NA 134*  K 4.3  CL 101  CO2 23  GLUCOSE 193*  BUN 13  CREATININE 1.10  CALCIUM  9.5  GFRNONAA >60  ANIONGAP 10    No results for input(s): PROT, ALBUMIN , AST, ALT, ALKPHOS, BILITOT in the last 168 hours. Lipids No results for input(s):  CHOL, TRIG, HDL, LABVLDL, LDLCALC, CHOLHDL in the last 168 hours.  Hematology Recent Labs  Lab 02/16/23 2233  WBC 5.8  RBC 5.04  HGB 14.6  HCT 46.0  MCV 91.3  MCH 29.0  MCHC 31.7  RDW 14.1  PLT 280   Thyroid  No results for input(s): TSH, FREET4 in the last 168 hours.  BNP Recent Labs  Lab 02/16/23 2233  BNP 710.8*    DDimer No results for input(s): DDIMER in the last 168 hours.   Radiology/Studies:  DG Chest 2 View Result Date: 02/16/2023 CLINICAL DATA:  Chest pain and cough. EXAM: CHEST - 2 VIEW COMPARISON:  12/10/2022 FINDINGS: Heart size and pulmonary vascularity are normal. Coarse interstitial changes in the lungs with mostly perihilar and basilar distribution. This is mildly progressed since prior study with focal infiltration seen in the right lung base. Mild bronchiectasis. Changes likely represent acute on chronic bronchitic changes with possible developing pneumonia. Subcentimeter nodule seen previously in the left upper lung is unchanged, likely calcified granuloma. No pleural effusion or pneumothorax. Mediastinal contours appear intact. Degenerative changes in the spine and shoulders. IMPRESSION: Bronchiectasis and increasing coarse central interstitial changes in the lungs with possible developing infiltrate in the right base. Changes likely to represent acute on chronic bronchitic changes with possible developing pneumonia. Electronically Signed   By: Elsie Gravely M.D.   On: 02/16/2023 22:59     Assessment and Plan:   #Acute on chronic HFrEF Exacerbation #Hx inferior STEMI (2023) #Hx anterior STEMI s/p DES to pLAD (12/10/22) #Acute cough Patient presenting  with intermittent chest pain since his recent STEMI in 12/10/22 with an acute episode of chest pain that self-resolved after 3h. EKG without ST changes and trop has remained flat. He also notes a significant cough that is positional that he possibly attributes to reflux. On exam, he does not  clinically appear significantly volume overloaded, though may have evidence of interstitial edema vs infection on CXR. ProBNP elevated to 710.8 compared to baseline ~350. Possible etiology of his symptoms could include HF exacerbation, pericarditis, GERD, pneumonia/bronchiectasis.  Would recommend IV diuresis, TTE, ESR/CRP, antibiotics per primary team; could consider initiation of long-acting nitrate if recurrent chest pain. --obtain repeat TTE  --ESR/CRP Anticoagulation/antiplatelet --continue apixaban  5mg  bid --continue clopidogrel  75mg  every day HLD --continue rosuvastatin  40mg  every day --continue ezetimibe  10mg  every day  --LDL 60 on 02/06/23  GDMT --continue spironolactone  12.5mg  every day --continue entresto  24-26mg  bid --continue metoprolol  succinate 25mg  every day --continue empagliflozin  10mg  every day --consider starting imdur if BP allows  Diuresis --IV diuresis  --Goal K~4, Mag ~2 --maintain telemetry, strict I/Os, daily weights  #Afib --continue apixaban  5mg  bid  --continue amiodarone  200mg  every day   #Elevated TSH --obtain repeat TSH, FT4  --TSH 7.943 on 1/27   Risk Assessment/Risk Scores:   New York  Heart Association (NYHA) Functional Class NYHA Class II  CHA2DS2-VASc Score = 5   This indicates a 7.2% annual risk of stroke. The patient's score is based upon: CHF History: 1 HTN History: 1 Diabetes History: 1 Stroke History: 0 Vascular Disease History: 1 Age Score: 1 Gender Score: 0    For questions or updates, please contact [facility] Please consult [email or web address] for contact info under   Earlene CHRISTELLA Cluster, MD  02/17/2023 4:13 AM   -------------------------------------------------------------------------------------  After conducting a review of all available clinical information with the care team, interviewing the patient, and performing a physical exam, I agree with the findings and plan described in this note.   68 year old male  with hypertension, hyperlipidemia, type 2 diabetes mellitus, CAD with inferior STEMI 10/2021, anterior STEMI 11/2022, both treated with primary PCI, PAF, HFrEF with ischemic cardiomyopathy, admitted with chest pain, cough.  Workup so far significant for elevated proBNP at 710, up from baseline around 350, interstitial edema versus infiltrate on chest x-ray.  Blood pressure has been low normal, without significant tachycardia.  Physical exam shows basal rales, JVD, no leg edema  HFrEF: Acute on chronic. Mild volume overload. No shock.  Resume IV lasix  20 mg bid. Increase as needed and tolerated. Recommend strict I/O. Continue spironolactone  12.5 mg daily, Jardiance  10 mg daily. Hold later if SBP <100 mmHg consistently. Okay to hold metoprolol  and Entresto  for now. Cough could be due to vascular congestion as well Entresto . We could consider switching Entresto  to losartan  12.5 mg daily before if discharge, if cough persists. Echocardiogram pending.  Chest pain: Likely secondary t heart failure exacerbation and cough. Trop flat. No ACS. Continue Plavix .  PAF: In sinus rhythm. Continue Eliquis  5 mg bid, amiodarone  200 mg daily.     Newman Lawrence, MD

## 2023-02-17 NOTE — Assessment & Plan Note (Addendum)
 Observation card/tele bed. Check echo. BP borderline low. Extremities are warm. No JVD. Coarse BS but no overt rales. EDP ordered 20 mg IV lasix . EDP has consulted cardiology fellow [place]. Will hold entresto  to give BP more room for diuresis. Continue with toprol -xl. Check echo. Doubt think he is in cardiogenic shock. Pt has not been taking lasix  as it is only prescribed as needed.  Pt's also need to have increased fluid restriction. Pt states he was told by cardiology office he is on 2000 ml fluid restriction per day. Continue with lasix  20 mg IV BID. I don't think he has pneumonia. Pt was given 1 dose of IV aBX by EDP. Will check procalcitonin. Pt is negatie for flu/rsv/covid.

## 2023-02-17 NOTE — Assessment & Plan Note (Signed)
 Pt states that miralax  is not that effective for his constipation. Will change to po lactulose  and dulcolax.

## 2023-02-18 LAB — COMPREHENSIVE METABOLIC PANEL
ALT: 12 U/L (ref 0–44)
AST: 16 U/L (ref 15–41)
Albumin: 3.6 g/dL (ref 3.5–5.0)
Alkaline Phosphatase: 39 U/L (ref 38–126)
Anion gap: 14 (ref 5–15)
BUN: 10 mg/dL (ref 8–23)
CO2: 21 mmol/L — ABNORMAL LOW (ref 22–32)
Calcium: 9.4 mg/dL (ref 8.9–10.3)
Chloride: 102 mmol/L (ref 98–111)
Creatinine, Ser: 1.24 mg/dL (ref 0.61–1.24)
GFR, Estimated: >60 mL/min (ref >60)
Glucose, Bld: 135 mg/dL — ABNORMAL HIGH (ref 70–99)
Potassium: 4.2 mmol/L (ref 3.5–5.1)
Sodium: 137 mmol/L (ref 135–145)
Total Bilirubin: 0.8 mg/dL (ref 0.0–1.2)
Total Protein: 7 g/dL (ref 6.5–8.1)

## 2023-02-18 LAB — MAGNESIUM: Magnesium: 2 mg/dL (ref 1.7–2.4)

## 2023-02-18 LAB — GLUCOSE, CAPILLARY
Glucose-Capillary: 169 mg/dL — ABNORMAL HIGH (ref 70–99)
Glucose-Capillary: 206 mg/dL — ABNORMAL HIGH (ref 70–99)
Glucose-Capillary: 170 mg/dL — ABNORMAL HIGH (ref 70–99)
Glucose-Capillary: 207 mg/dL — ABNORMAL HIGH (ref 70–99)

## 2023-02-18 MED ORDER — METOPROLOL SUCCINATE ER 25 MG PO TB24
12.5000 mg | ORAL_TABLET | Freq: Every day | ORAL | Status: DC
  Administered 2023-02-19: 12.5 mg via ORAL
  Filled 2023-02-18 (×2): qty 1

## 2023-02-18 NOTE — Progress Notes (Signed)
   Rounding Note    Patient Name: Zebulon Surman Date of Encounter: 02/18/2023  [facility] Cardiologist: [name], MD   Subjective   [name]. Reports 50% improvement in his cough and breathing.  Vital Signs    Vitals:   02/18/23 0452 02/18/23 0453 02/18/23 0748 02/18/23 0800  BP: 104/73   (S) (!) 85/58  Pulse:   72   Resp: 18     Temp: 97.8 F (36.6 C)  (!) 97.5 F (36.4 C)   TempSrc: Oral  Oral   SpO2: 100% 100% 99%   Weight:      Height:        Intake/Output Summary (Last 24 hours) at 02/18/2023 0853 Last data filed at 02/17/2023 1950 Gross per 24 hour  Intake 200 ml  Output --  Net 200 ml      02/18/2023    4:12 AM 02/16/2023   10:26 PM 02/06/2023    9:34 AM  Last 3 Weights  Weight (lbs) 154 lb 12.2 oz 163 lb 161 lb 9.6 oz  Weight (kg) 70.2 kg 73.936 kg 73.301 kg      Telemetry    NSVT - Personally Reviewed  ECG     Personally Reviewed  Physical Exam   GEN: No acute distress.   Cardiac: RRR, no murmurs, rubs, or gallops. Trace edema in bilateral Les/ankles Respiratory: mild crackles in bilateral lung bases Psych: Normal affect   Assessment & Plan    #acute on chronic systolic heart failure #Ischemic cardiomyopathy #CAD Warm and still slightly volume up on exam - stop spiro today given hypotension, possibly restart after diuresis is completed - keep K>4, Mg>2 - cont lasix  20mg  IV q12. Anticipate transition to oral on Sunday. - cont plavix  - cont eliquis   #AF Cont amio Cont eliquis  Will need outpatient follow up re: TSH/FT4.   Ole T. Cindie, MD, [facility], [facility] Cardiac Electrophysiology

## 2023-02-18 NOTE — Discharge Summary (Deleted)
 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 Pt has been losing weight since his heart attack in 11-2022. He has lost 12 lbs. Has noticed some swelling in his legs (L>R). He has not taken any lasix . Pt has spent the last 2 night trying to sleep upright on his couch.  In the ED patient had stable vitals.  Initial labs showed mild hyponatremia with sodium of 134.  Creatinine was 1.1.  Procalcitonin was less than 0.1 hemoglobin was 14.6.  BNP was slightly elevated at 170.  EKG showed normal sinus rhythm.  Chest x-ray showed Bronchiectasis and increasing coarse central interstitial changes in the lungs with possible developing infiltrate in the right base. Changes likely to represent acute on chronic bronchitic changes with possible developing pneumonia.  Patient was given Rocephin  and Zithromax  in the ED and was considered for admission to the hospital for further evaluation and treatment.  Assessment/Plan: Principal Problem:   Acute on chronic systolic CHF (congestive heart failure) (HCC) Active Problems:   CAD (coronary artery disease)   Essential hypertension   Hyperlipidemia   Constipation   PAF (paroxysmal atrial fibrillation) (HCC)   Type 2 diabetes mellitus with complication, without long-term current use of insulin  (HCC)   Ischemic cardiomyopathy   CKD (chronic kidney disease), stage II   CHF (congestive heart failure) (HCC)  Acute on chronic systolic CHF (congestive heart failure)  Continue CHF protocol.  Repeat 2D echocardiogram from 02/17/2023 showed LV ejection fraction of 25% with global hypokinesis and grade 3 diastolic dysfunction.   Cardiology on board and  patient has been put on fluid restriction, Jardiance , Lasix  20 twice daily, metoprolol .  Entresto  and spironolactone  on hold.  Was recently seen by cardiology office and was put on 2 L fluid restrictions per day.  Review of previous 2D echocardiogram from 12/2022 showed LV ejection fraction of 20 to 25% with regional wall motion abnormality and grade 2 diastolic dysfunction.  No significant improvement in cardiac function.   CKD (chronic kidney disease), stage II Serum creatinine of 1.2.  Will continue to monitor while on diuretics.   Ischemic cardiomyopathy/CAD No active chest pain.  Continue Plavix  Crestor , metoprolol    Type 2 diabetes mellitus with complication, without long-term current use of insulin  (HCC) Continue to hold the Glucophage .  Continue jardiance , SSI.  Latest hemoglobin A1c of 7.8.   PAF (paroxysmal atrial fibrillation Continue Bid eliquis , metoprolol  and amiodarone .  Rate controlled at this time   Constipation Continue lactulose  and Dulcolax   Hyperlipidemia Continue crestor  and ezetimibe    Essential hypertension Continue to hold Entresto , spironolactone  due to borderline low blood pressure.  Continue metoprolol , Jardiance .  DVT prophylaxis: SCDs Start: 02/17/23 0457 apixaban  (ELIQUIS ) tablet 5 mg   Disposition: Likely home in 1 to 2 days.  Status is: Inpatient Remains inpatient appropriate because: Decompensated heart failure, medication titration, pending clinical improvement   Code Status:     Code Status: Full Code  Family Communication: Spoke with patient's sister on 02/17/2023  Consultants: Cardiology  Procedures: None  Anti-infectives:  None  Anti-infectives (From admission, onward)    Start     Dose/Rate Route Frequency Ordered Stop   02/17/23 0200  azithromycin  (ZITHROMAX )  tablet 500 mg        500 mg Oral  Once 02/17/23 0157 02/17/23 0203   02/16/23 2345  cefTRIAXone  (ROCEPHIN ) 1 g in sodium chloride  0.9 % 100 mL IVPB        1 g 200 mL/hr  over 30 Minutes Intravenous  Once 02/16/23 2342 02/17/23 0104       Subjective: Today, patient was seen and examined at bedside.  Still complains of some cough and shortness of breath but little better with shortness of breath today.  Swelling in the legs have improved as well.  Objective: Vitals:   02/18/23 0800 02/18/23 1110  BP: (S) (!) 85/58 95/69  Pulse:  73  Resp:  18  Temp:  97.7 F (36.5 C)  SpO2:  96%    Intake/Output Summary (Last 24 hours) at 02/18/2023 1134 Last data filed at 02/18/2023 0856 Gross per 24 hour  Intake 200 ml  Output 1000 ml  Net -800 ml   Filed Weights   02/16/23 2226 02/18/23 0412  Weight: 73.9 kg 70.2 kg   Body mass index is 20.99 kg/m.   Physical Exam:  GENERAL: Patient is alert awake and oriented. Not in obvious distress. HENT: No scleral pallor or icterus. Pupils equally reactive to light. Oral mucosa is moist NECK: is supple, no gross swelling noted. CHEST:  Diminished breath sounds bilaterally.  Mild crackles noted. CVS: S1 and S2 heard, no murmur. Regular rate and rhythm.  ABDOMEN: Soft, non-tender, bowel sounds are present. EXTREMITIES: Trace edema over the lower extremity. CNS: Cranial nerves are intact. No focal motor deficits. SKIN: warm and dry without rashes.  Data Review: I have personally reviewed the following laboratory data and studies,  CBC: Recent Labs  Lab 02/16/23 2233  WBC 5.8  HGB 14.6  HCT 46.0  MCV 91.3  PLT 280   Basic Metabolic Panel: Recent Labs  Lab 02/16/23 2233 02/17/23 2003 02/18/23 0234  NA 134* 137 137  K 4.3 3.9 4.2  CL 101 100 102  CO2 23 25 21*  GLUCOSE 193* 201* 135*  BUN 13 11 10   CREATININE 1.10 1.21 1.24  CALCIUM  9.5 9.6 9.4  MG  --  2.1 2.0   Liver Function Tests: Recent Labs  Lab 02/18/23 0234  AST 16  ALT 12  ALKPHOS 39  BILITOT 0.8  PROT 7.0  ALBUMIN  3.6   No results for input(s): LIPASE, AMYLASE in the last 168 hours. No results for input(s): AMMONIA in  the last 168 hours. Cardiac Enzymes: No results for input(s): CKTOTAL, CKMB, CKMBINDEX, TROPONINI in the last 168 hours. BNP (last 3 results) Recent Labs    12/27/22 1526 02/06/23 1025 02/16/23 2233  BNP 349.9* 586.4* 710.8*    ProBNP (last 3 results) No results for input(s): PROBNP in the last 8760 hours.  CBG: Recent Labs  Lab 02/17/23 0749 02/17/23 1123 02/17/23 1648 02/17/23 2034 02/18/23 0611  GLUCAP 121* 159* 153* 202* 169*   Recent Results (from the past 240 hours)  Resp panel by RT-PCR (RSV, Flu A&B, Covid) Anterior Nasal Swab     Status: None   Collection Time: 02/16/23 10:34 PM   Specimen: Anterior Nasal Swab  Result Value Ref Range Status   SARS Coronavirus 2 by RT PCR NEGATIVE NEGATIVE Final   Influenza A by PCR NEGATIVE NEGATIVE Final   Influenza B by PCR NEGATIVE NEGATIVE Final    Comment: (NOTE) The Xpert Xpress SARS-CoV-2/FLU/RSV plus assay is intended as an aid in the diagnosis  of influenza from Nasopharyngeal swab specimens and should not be used as a sole basis for treatment. Nasal washings and aspirates are unacceptable for Xpert Xpress SARS-CoV-2/FLU/RSV testing.  Fact Sheet for Patients: bloggercourse.com  Fact Sheet for Healthcare Providers: seriousbroker.it  This test is not yet approved or cleared by the [place]  FDA and has been authorized for detection and/or diagnosis of SARS-CoV-2 by FDA under an Emergency Use Authorization (EUA). This EUA will remain in effect (meaning this test can be used) for the duration of the COVID-19 declaration under Section 564(b)(1) of the Act, 21 U.S.C. section 360bbb-3(b)(1), unless the authorization is terminated or revoked.     Resp Syncytial Virus by PCR NEGATIVE NEGATIVE Final    Comment: (NOTE) Fact Sheet for Patients: bloggercourse.com  Fact Sheet for Healthcare  Providers: seriousbroker.it  This test is not yet approved or cleared by the [place]  FDA and has been authorized for detection and/or diagnosis of SARS-CoV-2 by FDA under an Emergency Use Authorization (EUA). This EUA will remain in effect (meaning this test can be used) for the duration of the COVID-19 declaration under Section 564(b)(1) of the Act, 21 U.S.C. section 360bbb-3(b)(1), unless the authorization is terminated or revoked.  Performed at [facility], [address], [place], [place] [address]      Studies: LONG TERM MONITOR (3-14 DAYS) Result Date: 02/17/2023 Zio patch monitor 14 days 01/25/2023 - 02/08/2023: Dominant rhythm: Sinus. HR 59-91 bpm. Avg HR 69 bpm, in sinus rhythm. 2 episodes of SVT/atrial tachycardia, fastest and longest at 118 bpm for 15 beats. <1% isolated SVE, couplet/triplets. 0 episodes of VT. <1% isolated VE, couplets. No atrial fibrillation/atrial flutter/VT/high grade AV block, sinus pause >3sec noted. 3 patient triggered events, correlated with SVE/VE.   ECHOCARDIOGRAM COMPLETE Result Date: 02/17/2023    ECHOCARDIOGRAM REPORT   Patient Name:   CAIDENCE HIGASHI Date of Exam: 02/17/2023 Medical Rec #:  [id number]            Height:       72.0 in Accession #:    [id number]           Weight:       163.0 lb Date of Birth:  02-May-1955           BSA:          1.953 m Patient Age:    66 years             BP:           104/76 mmHg Patient Gender: M                    HR:           70 bpm. Exam Location:  Inpatient Procedure: 2D Echo, Cardiac Doppler, Color Doppler, Strain Analysis and            Intracardiac Opacification Agent Indications:    CHF- Acute Systolic  History:        Patient has prior history of Echocardiogram examinations, most                 recent 12/11/2022. CAD; Risk Factors:Hypertension and Diabetes.  Sonographer:    Ozell Free Referring Phys: [phone] ERIC CHEN  Sonographer Comments: Global longitudinal strain was  attempted. IMPRESSIONS  1. No LV thrombus noted. Left ventricular ejection fraction, by estimation, is 25%. The left ventricle has severely decreased function. The left ventricle demonstrates global hypokinesis. The left ventricular internal cavity size was mildly  dilated. Left ventricular diastolic parameters are consistent with Grade III diastolic dysfunction (restrictive). Elevated left atrial pressure.  2. Right ventricular systolic function is mildly reduced. The right ventricular size is mildly enlarged. There is moderately elevated pulmonary artery systolic pressure. The estimated right ventricular systolic pressure is 59.9 mmHg.  3. Left atrial size was moderately dilated.  4. Right atrial size was mildly dilated.  5. The mitral valve is abnormal. Moderate functional mitral valve regurgitation. No evidence of mitral stenosis.  6. The aortic valve is tricuspid. Aortic valve regurgitation is not visualized. No aortic stenosis is present.  7. Pleural effusion noted. A small pericardial effusion is present. The pericardial effusion is localized near the right ventricle.  8. The inferior vena cava is normal in size with greater than 50% respiratory variability, suggesting right atrial pressure of 3 mmHg. FINDINGS  Left Ventricle: No LV thrombus noted. Left ventricular ejection fraction, by estimation, is 25%. The left ventricle has severely decreased function. The left ventricle demonstrates global hypokinesis. Definity  contrast agent was given IV to delineate the left ventricular endocardial borders. The left ventricular internal cavity size was mildly dilated. There is no left ventricular hypertrophy. Left ventricular diastolic parameters are consistent with Grade III diastolic dysfunction (restrictive). Elevated left atrial pressure. Right Ventricle: The right ventricular size is mildly enlarged. No increase in right ventricular wall thickness. Right ventricular systolic function is mildly reduced. There is  moderately elevated pulmonary artery systolic pressure. The tricuspid regurgitant velocity is 3.77 m/s, and with an assumed right atrial pressure of 3 mmHg, the estimated right ventricular systolic pressure is 59.9 mmHg. Left Atrium: Left atrial size was moderately dilated. Right Atrium: Right atrial size was mildly dilated. Pericardium: Pleural effusion noted. A small pericardial effusion is present. The pericardial effusion is localized near the right ventricle. Mitral Valve: The mitral valve is abnormal. Moderate mitral valve regurgitation. No evidence of mitral valve stenosis. MV peak gradient, 8.2 mmHg. The mean mitral valve gradient is 2.0 mmHg. Tricuspid Valve: The tricuspid valve is normal in structure. Tricuspid valve regurgitation is mild. Aortic Valve: The aortic valve is tricuspid. Aortic valve regurgitation is not visualized. No aortic stenosis is present. Aortic valve mean gradient measures 3.0 mmHg. Aortic valve peak gradient measures 6.2 mmHg. Aortic valve area, by VTI measures 1.78 cm. Pulmonic Valve: The pulmonic valve was normal in structure. Pulmonic valve regurgitation is trivial. Aorta: The aortic root is normal in size and structure. Venous: The inferior vena cava is normal in size with greater than 50% respiratory variability, suggesting right atrial pressure of 3 mmHg. IAS/Shunts: No atrial level shunt detected by color flow Doppler.  LEFT VENTRICLE PLAX 2D LVIDd:         5.60 cm      Diastology LVIDs:         4.30 cm      LV e' medial:    4.03 cm/s LV PW:         0.90 cm      LV E/e' medial:  30.0 LV IVS:        1.10 cm      LV e' lateral:   5.22 cm/s LVOT diam:     1.90 cm      LV E/e' lateral: 23.2 LV SV:         41 LV SV Index:   21 LVOT Area:     2.84 cm  LV Volumes (MOD) LV vol d, MOD A2C: 169.0 ml LV vol d, MOD A4C: 165.0  ml LV vol s, MOD A2C: 107.0 ml LV vol s, MOD A4C: 114.0 ml LV SV MOD A2C:     62.0 ml LV SV MOD A4C:     165.0 ml LV SV MOD BP:      57.0 ml RIGHT VENTRICLE              IVC RV Basal diam:  5.20 cm     IVC diam: 2.00 cm RV S prime:     11.30 cm/s TAPSE (M-mode): 2.0 cm LEFT ATRIUM              Index        RIGHT ATRIUM           Index LA diam:        4.70 cm  2.41 cm/m   RA Area:     20.10 cm LA Vol (A2C):   118.0 ml 60.42 ml/m  RA Volume:   66.80 ml  34.20 ml/m LA Vol (A4C):   67.8 ml  34.71 ml/m LA Biplane Vol: 90.4 ml  46.28 ml/m  AORTIC VALVE AV Area (Vmax):    1.85 cm AV Area (Vmean):   1.69 cm AV Area (VTI):     1.78 cm AV Vmax:           124.00 cm/s AV Vmean:          82.800 cm/s AV VTI:            0.233 m AV Peak Grad:      6.2 mmHg AV Mean Grad:      3.0 mmHg LVOT Vmax:         81.00 cm/s LVOT Vmean:        49.300 cm/s LVOT VTI:          0.146 m LVOT/AV VTI ratio: 0.63  AORTA Ao Root diam: 3.20 cm Ao Asc diam:  3.60 cm MITRAL VALVE                TRICUSPID VALVE MV Area (PHT): 4.80 cm     TR Peak grad:   56.9 mmHg MV Area VTI:   1.41 cm     TR Vmax:        377.00 cm/s MV Peak grad:  8.2 mmHg MV Mean grad:  2.0 mmHg     SHUNTS MV Vmax:       1.43 m/s     Systemic VTI:  0.15 m MV Vmean:      69.2 cm/s    Systemic Diam: 1.90 cm MV Decel Time: 158 msec MR Peak grad: 55.4 mmHg MR Mean grad: 34.0 mmHg MR Vmax:      372.00 cm/s MR Vmean:     270.0 cm/s MV E velocity: 121.00 cm/s MV A velocity: 39.40 cm/s MV E/A ratio:  3.07 Dalton McleanMD Electronically signed by Ezra Kanner Signature Date/Time: 02/17/2023/2:47:49 PM    Final    DG Chest 2 View Result Date: 02/16/2023 CLINICAL DATA:  Chest pain and cough. EXAM: CHEST - 2 VIEW COMPARISON:  12/10/2022 FINDINGS: Heart size and pulmonary vascularity are normal. Coarse interstitial changes in the lungs with mostly perihilar and basilar distribution. This is mildly progressed since prior study with focal infiltration seen in the right lung base. Mild bronchiectasis. Changes likely represent acute on chronic bronchitic changes with possible developing pneumonia. Subcentimeter nodule seen previously in the left upper  lung is unchanged, likely calcified granuloma. No pleural effusion or pneumothorax. Mediastinal contours appear intact. Degenerative changes  in the spine and shoulders. IMPRESSION: Bronchiectasis and increasing coarse central interstitial changes in the lungs with possible developing infiltrate in the right base. Changes likely to represent acute on chronic bronchitic changes with possible developing pneumonia. Electronically Signed   By: Elsie Gravely M.D.   On: 02/16/2023 22:59      Vernal Alstrom, MD  [facility] 02/18/2023  If 7PM-7AM, please contact night-coverage

## 2023-02-19 LAB — CBC
HCT: 43.7 % (ref 39.0–52.0)
Hemoglobin: 14.4 g/dL (ref 13.0–17.0)
MCH: 29.4 pg (ref 26.0–34.0)
MCHC: 33 g/dL (ref 30.0–36.0)
MCV: 89.4 fL (ref 80.0–100.0)
Platelets: 244 10*3/uL (ref 150–400)
RBC: 4.89 MIL/uL (ref 4.22–5.81)
RDW: 13.9 % (ref 11.5–15.5)
WBC: 6.4 10*3/uL (ref 4.0–10.5)
nRBC: 0 % (ref 0.0–0.2)

## 2023-02-19 LAB — BASIC METABOLIC PANEL
Anion gap: 15 (ref 5–15)
BUN: 12 mg/dL (ref 8–23)
CO2: 24 mmol/L (ref 22–32)
Calcium: 9.7 mg/dL (ref 8.9–10.3)
Chloride: 99 mmol/L (ref 98–111)
Creatinine, Ser: 1.26 mg/dL — ABNORMAL HIGH (ref 0.61–1.24)
GFR, Estimated: >60 mL/min (ref >60)
Glucose, Bld: 135 mg/dL — ABNORMAL HIGH (ref 70–99)
Potassium: 3.8 mmol/L (ref 3.5–5.1)
Sodium: 138 mmol/L (ref 135–145)

## 2023-02-19 LAB — MAGNESIUM: Magnesium: 2 mg/dL (ref 1.7–2.4)

## 2023-02-19 LAB — GLUCOSE, CAPILLARY
Glucose-Capillary: 148 mg/dL — ABNORMAL HIGH (ref 70–99)
Glucose-Capillary: 361 mg/dL — ABNORMAL HIGH (ref 70–99)
Glucose-Capillary: 118 mg/dL — ABNORMAL HIGH (ref 70–99)
Glucose-Capillary: 191 mg/dL — ABNORMAL HIGH (ref 70–99)

## 2023-02-19 MED ORDER — TORSEMIDE 20 MG PO TABS: 20.0000 mg | ORAL_TABLET | Freq: Every day | ORAL | Status: DC

## 2023-02-19 MED ORDER — TORSEMIDE 20 MG PO TABS
20.0000 mg | ORAL_TABLET | Freq: Every day | ORAL | Status: DC
  Administered 2023-02-19 – 2023-02-20 (×2): 20 mg via ORAL
  Filled 2023-02-19 (×2): qty 1

## 2023-02-19 MED ORDER — TORSEMIDE 20 MG PO TABS: 20.0000 mg | ORAL_TABLET | Freq: Two times a day (BID) | ORAL | Status: DC

## 2023-02-19 NOTE — Progress Notes (Addendum)
 PROGRESS NOTE  Garrett Roberts FMW:[id number] DOB: 04/09/1955 DOA: 02/16/2023 PCP: Pcp, No   LOS: 2 days   Brief narrative:  68 yo [place] male with past medical history of of ischemic cardiomyopathy, chronic systolic CHF LVEF 20%, STEMI in 11-2022, GERD, constipation, presented to ER with 2 weeks of  SOB, 2-3 days of orthopnea, cough and productive clear sputum. Pt has been losing weight since his heart attack in 11-2022. He has lost 12 lbs. Has noticed some swelling in his legs (L>R). He has not taken any lasix . Pt has spent the last 2 night trying to sleep upright on his couch.  In the ED patient had stable vitals.  Initial labs showed mild hyponatremia with sodium of 134.  Creatinine was 1.1.  Procalcitonin was less than 0.1 hemoglobin was 14.6.  BNP was slightly elevated at 170.  EKG showed normal sinus rhythm.  Chest x-ray showed Bronchiectasis and increasing coarse central interstitial changes in the lungs with possible developing infiltrate in the right base. Changes likely to represent acute on chronic bronchitic changes with possible developing pneumonia.  Patient was given Rocephin  and Zithromax  in the ED and was considered for admission to the hospital for further evaluation and treatment.  Assessment/Plan: Principal Problem:   Acute on chronic systolic CHF (congestive heart failure) (HCC) Active Problems:   CAD (coronary artery disease)   Essential hypertension   Hyperlipidemia   Constipation   PAF (paroxysmal atrial fibrillation) (HCC)   Type 2 diabetes mellitus with complication, without long-term current use of insulin  (HCC)   Ischemic cardiomyopathy   CKD (chronic kidney disease), stage II   CHF (congestive heart failure) (HCC)  Acute on chronic systolic CHF (congestive heart failure)  Continue CHF protocol.  Repeat 2D echocardiogram from 02/17/2023 showed LV ejection fraction of 25% with global hypokinesis and grade 3 diastolic dysfunction.   Cardiology on board and  patient has been put on fluid restriction, Jardiance , Lasix  20 twice daily, metoprolol .  Entresto  and spironolactone  on hold.  Was recently seen by cardiology office and was put on 2 L fluid restrictions per day.  Review of previous 2D echocardiogram from 12/2022 showed LV ejection fraction of 20 to 25% with regional wall motion abnormality and grade 2 diastolic dysfunction.  No significant improvement in cardiac function.   CKD (chronic kidney disease), stage II Serum creatinine of 1.2.  Will continue to monitor while on diuretics.   Ischemic cardiomyopathy/CAD No active chest pain.  Continue Plavix  Crestor , metoprolol    Type 2 diabetes mellitus with complication, without long-term current use of insulin  (HCC) Continue to hold the Glucophage .  Continue jardiance , SSI.  Latest hemoglobin A1c of 7.8.   PAF (paroxysmal atrial fibrillation Continue Bid eliquis , metoprolol  and amiodarone .  Rate controlled at this time   Constipation Continue lactulose  and Dulcolax   Hyperlipidemia Continue crestor  and ezetimibe    Essential hypertension Continue to hold Entresto , spironolactone  due to borderline low blood pressure.  Continue metoprolol , Jardiance .  DVT prophylaxis: SCDs Start: 02/17/23 0457 apixaban  (ELIQUIS ) tablet 5 mg   Disposition: Likely home in 1 to 2 days.  Status is: Inpatient Remains inpatient appropriate because: Decompensated heart failure, medication titration, pending clinical improvement   Code Status:     Code Status: Full Code  Family Communication: Spoke with patient's sister on 02/17/2023  Consultants: Cardiology  Procedures: None  Anti-infectives:  None  Anti-infectives (From admission, onward)    Start     Dose/Rate Route Frequency Ordered Stop   02/17/23 0200  azithromycin  (ZITHROMAX )  tablet 500 mg        500 mg Oral  Once 02/17/23 0157 02/17/23 0203   02/16/23 2345  cefTRIAXone  (ROCEPHIN ) 1 g in sodium chloride  0.9 % 100 mL IVPB        1 g 200 mL/hr  over 30 Minutes Intravenous  Once 02/16/23 2342 02/17/23 0104       Subjective: Today, patient was seen and examined at bedside.  Still complains of some cough and shortness of breath but little better with shortness of breath today.  Swelling in the legs have improved as well.  Objective: Vitals:   02/19/23 0728 02/19/23 1109  BP: 92/76 90/65  Pulse: 74 75  Resp: 17 17  Temp: 97.7 F (36.5 C) 98.1 F (36.7 C)  SpO2: 100%     Intake/Output Summary (Last 24 hours) at 02/19/2023 1114 Last data filed at 02/19/2023 0817 Gross per 24 hour  Intake 797 ml  Output 600 ml  Net 197 ml   Filed Weights   02/16/23 2226 02/18/23 0412 02/19/23 0332  Weight: 73.9 kg 70.2 kg 68.4 kg   Body mass index is 20.45 kg/m.   Physical Exam:  GENERAL: Patient is alert awake and oriented. Not in obvious distress. HENT: No scleral pallor or icterus. Pupils equally reactive to light. Oral mucosa is moist NECK: is supple, no gross swelling noted. CHEST:  Diminished breath sounds bilaterally.  Mild crackles noted. CVS: S1 and S2 heard, no murmur. Regular rate and rhythm.  ABDOMEN: Soft, non-tender, bowel sounds are present. EXTREMITIES: Trace edema over the lower extremity. CNS: Cranial nerves are intact. No focal motor deficits. SKIN: warm and dry without rashes.  Data Review: I have personally reviewed the following laboratory data and studies,  CBC: Recent Labs  Lab 02/16/23 2233 02/19/23 0258  WBC 5.8 6.4  HGB 14.6 14.4  HCT 46.0 43.7  MCV 91.3 89.4  PLT 280 244   Basic Metabolic Panel: Recent Labs  Lab 02/16/23 2233 02/17/23 2003 02/18/23 0234 02/19/23 0258  NA 134* 137 137 138  K 4.3 3.9 4.2 3.8  CL 101 100 102 99  CO2 23 25 21* 24  GLUCOSE 193* 201* 135* 135*  BUN 13 11 10 12   CREATININE 1.10 1.21 1.24 1.26*  CALCIUM  9.5 9.6 9.4 9.7  MG  --  2.1 2.0 2.0   Liver Function Tests: Recent Labs  Lab 02/18/23 0234  AST 16  ALT 12  ALKPHOS 39  BILITOT 0.8  PROT 7.0   ALBUMIN  3.6   No results for input(s): LIPASE, AMYLASE in the last 168 hours. No results for input(s): AMMONIA in the last 168 hours. Cardiac Enzymes: No results for input(s): CKTOTAL, CKMB, CKMBINDEX, TROPONINI in the last 168 hours. BNP (last 3 results) Recent Labs    12/27/22 1526 02/06/23 1025 02/16/23 2233  BNP 349.9* 586.4* 710.8*    ProBNP (last 3 results) No results for input(s): PROBNP in the last 8760 hours.  CBG: Recent Labs  Lab 02/18/23 0611 02/18/23 1151 02/18/23 1634 02/18/23 2115 02/19/23 0608  GLUCAP 169* 206* 170* 207* 148*   Recent Results (from the past 240 hours)  Resp panel by RT-PCR (RSV, Flu A&B, Covid) Anterior Nasal Swab     Status: None   Collection Time: 02/16/23 10:34 PM   Specimen: Anterior Nasal Swab  Result Value Ref Range Status   SARS Coronavirus 2 by RT PCR NEGATIVE NEGATIVE Final   Influenza A by PCR NEGATIVE NEGATIVE Final   Influenza B by  PCR NEGATIVE NEGATIVE Final    Comment: (NOTE) The Xpert Xpress SARS-CoV-2/FLU/RSV plus assay is intended as an aid in the diagnosis of influenza from Nasopharyngeal swab specimens and should not be used as a sole basis for treatment. Nasal washings and aspirates are unacceptable for Xpert Xpress SARS-CoV-2/FLU/RSV testing.  Fact Sheet for Patients: bloggercourse.com  Fact Sheet for Healthcare Providers: seriousbroker.it  This test is not yet approved or cleared by the [place]  FDA and has been authorized for detection and/or diagnosis of SARS-CoV-2 by FDA under an Emergency Use Authorization (EUA). This EUA will remain in effect (meaning this test can be used) for the duration of the COVID-19 declaration under Section 564(b)(1) of the Act, 21 U.S.C. section 360bbb-3(b)(1), unless the authorization is terminated or revoked.     Resp Syncytial Virus by PCR NEGATIVE NEGATIVE Final    Comment: (NOTE) Fact Sheet for  Patients: bloggercourse.com  Fact Sheet for Healthcare Providers: seriousbroker.it  This test is not yet approved or cleared by the [place]  FDA and has been authorized for detection and/or diagnosis of SARS-CoV-2 by FDA under an Emergency Use Authorization (EUA). This EUA will remain in effect (meaning this test can be used) for the duration of the COVID-19 declaration under Section 564(b)(1) of the Act, 21 U.S.C. section 360bbb-3(b)(1), unless the authorization is terminated or revoked.  Performed at [facility], [address], [place], [place] [address]      Studies: LONG TERM MONITOR (3-14 DAYS) Result Date: 02/17/2023 Zio patch monitor 14 days 01/25/2023 - 02/08/2023: Dominant rhythm: Sinus. HR 59-91 bpm. Avg HR 69 bpm, in sinus rhythm. 2 episodes of SVT/atrial tachycardia, fastest and longest at 118 bpm for 15 beats. <1% isolated SVE, couplet/triplets. 0 episodes of VT. <1% isolated VE, couplets. No atrial fibrillation/atrial flutter/VT/high grade AV block, sinus pause >3sec noted. 3 patient triggered events, correlated with SVE/VE.   ECHOCARDIOGRAM COMPLETE Result Date: 02/17/2023    ECHOCARDIOGRAM REPORT   Patient Name:   Garrett Roberts Date of Exam: 02/17/2023 Medical Rec #:  [id number]            Height:       72.0 in Accession #:    [id number]           Weight:       163.0 lb Date of Birth:  [birth year]           BSA:          1.953 m Patient Age:    39 years             BP:           104/76 mmHg Patient Gender: M                    HR:           70 bpm. Exam Location:  Inpatient Procedure: 2D Echo, Cardiac Doppler, Color Doppler, Strain Analysis and            Intracardiac Opacification Agent Indications:    CHF- Acute Systolic  History:        Patient has prior history of Echocardiogram examinations, most                 recent 12/11/2022. CAD; Risk Factors:Hypertension and Diabetes.  Sonographer:    Ozell Free  Referring Phys: [phone] ERIC CHEN  Sonographer Comments: Global longitudinal strain was attempted. IMPRESSIONS  1. No LV thrombus noted. Left ventricular ejection fraction, by estimation,  is 25%. The left ventricle has severely decreased function. The left ventricle demonstrates global hypokinesis. The left ventricular internal cavity size was mildly dilated. Left ventricular diastolic parameters are consistent with Grade III diastolic dysfunction (restrictive). Elevated left atrial pressure.  2. Right ventricular systolic function is mildly reduced. The right ventricular size is mildly enlarged. There is moderately elevated pulmonary artery systolic pressure. The estimated right ventricular systolic pressure is 59.9 mmHg.  3. Left atrial size was moderately dilated.  4. Right atrial size was mildly dilated.  5. The mitral valve is abnormal. Moderate functional mitral valve regurgitation. No evidence of mitral stenosis.  6. The aortic valve is tricuspid. Aortic valve regurgitation is not visualized. No aortic stenosis is present.  7. Pleural effusion noted. A small pericardial effusion is present. The pericardial effusion is localized near the right ventricle.  8. The inferior vena cava is normal in size with greater than 50% respiratory variability, suggesting right atrial pressure of 3 mmHg. FINDINGS  Left Ventricle: No LV thrombus noted. Left ventricular ejection fraction, by estimation, is 25%. The left ventricle has severely decreased function. The left ventricle demonstrates global hypokinesis. Definity  contrast agent was given IV to delineate the left ventricular endocardial borders. The left ventricular internal cavity size was mildly dilated. There is no left ventricular hypertrophy. Left ventricular diastolic parameters are consistent with Grade III diastolic dysfunction (restrictive). Elevated left atrial pressure. Right Ventricle: The right ventricular size is mildly enlarged. No increase in right  ventricular wall thickness. Right ventricular systolic function is mildly reduced. There is moderately elevated pulmonary artery systolic pressure. The tricuspid regurgitant velocity is 3.77 m/s, and with an assumed right atrial pressure of 3 mmHg, the estimated right ventricular systolic pressure is 59.9 mmHg. Left Atrium: Left atrial size was moderately dilated. Right Atrium: Right atrial size was mildly dilated. Pericardium: Pleural effusion noted. A small pericardial effusion is present. The pericardial effusion is localized near the right ventricle. Mitral Valve: The mitral valve is abnormal. Moderate mitral valve regurgitation. No evidence of mitral valve stenosis. MV peak gradient, 8.2 mmHg. The mean mitral valve gradient is 2.0 mmHg. Tricuspid Valve: The tricuspid valve is normal in structure. Tricuspid valve regurgitation is mild. Aortic Valve: The aortic valve is tricuspid. Aortic valve regurgitation is not visualized. No aortic stenosis is present. Aortic valve mean gradient measures 3.0 mmHg. Aortic valve peak gradient measures 6.2 mmHg. Aortic valve area, by VTI measures 1.78 cm. Pulmonic Valve: The pulmonic valve was normal in structure. Pulmonic valve regurgitation is trivial. Aorta: The aortic root is normal in size and structure. Venous: The inferior vena cava is normal in size with greater than 50% respiratory variability, suggesting right atrial pressure of 3 mmHg. IAS/Shunts: No atrial level shunt detected by color flow Doppler.  LEFT VENTRICLE PLAX 2D LVIDd:         5.60 cm      Diastology LVIDs:         4.30 cm      LV e' medial:    4.03 cm/s LV PW:         0.90 cm      LV E/e' medial:  30.0 LV IVS:        1.10 cm      LV e' lateral:   5.22 cm/s LVOT diam:     1.90 cm      LV E/e' lateral: 23.2 LV SV:         41 LV SV Index:   21 LVOT Area:  2.84 cm  LV Volumes (MOD) LV vol d, MOD A2C: 169.0 ml LV vol d, MOD A4C: 165.0 ml LV vol s, MOD A2C: 107.0 ml LV vol s, MOD A4C: 114.0 ml LV SV MOD  A2C:     62.0 ml LV SV MOD A4C:     165.0 ml LV SV MOD BP:      57.0 ml RIGHT VENTRICLE             IVC RV Basal diam:  5.20 cm     IVC diam: 2.00 cm RV S prime:     11.30 cm/s TAPSE (M-mode): 2.0 cm LEFT ATRIUM              Index        RIGHT ATRIUM           Index LA diam:        4.70 cm  2.41 cm/m   RA Area:     20.10 cm LA Vol (A2C):   118.0 ml 60.42 ml/m  RA Volume:   66.80 ml  34.20 ml/m LA Vol (A4C):   67.8 ml  34.71 ml/m LA Biplane Vol: 90.4 ml  46.28 ml/m  AORTIC VALVE AV Area (Vmax):    1.85 cm AV Area (Vmean):   1.69 cm AV Area (VTI):     1.78 cm AV Vmax:           124.00 cm/s AV Vmean:          82.800 cm/s AV VTI:            0.233 m AV Peak Grad:      6.2 mmHg AV Mean Grad:      3.0 mmHg LVOT Vmax:         81.00 cm/s LVOT Vmean:        49.300 cm/s LVOT VTI:          0.146 m LVOT/AV VTI ratio: 0.63  AORTA Ao Root diam: 3.20 cm Ao Asc diam:  3.60 cm MITRAL VALVE                TRICUSPID VALVE MV Area (PHT): 4.80 cm     TR Peak grad:   56.9 mmHg MV Area VTI:   1.41 cm     TR Vmax:        377.00 cm/s MV Peak grad:  8.2 mmHg MV Mean grad:  2.0 mmHg     SHUNTS MV Vmax:       1.43 m/s     Systemic VTI:  0.15 m MV Vmean:      69.2 cm/s    Systemic Diam: 1.90 cm MV Decel Time: 158 msec MR Peak grad: 55.4 mmHg MR Mean grad: 34.0 mmHg MR Vmax:      372.00 cm/s MR Vmean:     270.0 cm/s MV E velocity: 121.00 cm/s MV A velocity: 39.40 cm/s MV E/A ratio:  3.07 Dalton McleanMD Electronically signed by Ezra Kanner Signature Date/Time: 02/17/2023/2:47:49 PM    Final       Vernal Alstrom, MD  [facility] 02/19/2023  If 7PM-7AM, please contact night-coverage

## 2023-02-19 NOTE — Progress Notes (Signed)
 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 Pt has been losing weight since his heart attack in 11-2022. He has lost 12 lbs. Has noticed some swelling in his legs (L>R). He has not taken any lasix . Pt has spent the last 2 night trying to sleep upright on his couch.  In the ED patient had stable vitals.  Initial labs showed mild hyponatremia with sodium of 134.  Creatinine was 1.1.  Procalcitonin was less than 0.1 hemoglobin was 14.6.  BNP was slightly elevated at 170.  EKG showed normal sinus rhythm.  Chest x-ray showed Bronchiectasis and increasing coarse central interstitial changes in the lungs with possible developing infiltrate in the right base. Changes likely to represent acute on chronic bronchitic changes with possible developing pneumonia.  Patient was given Rocephin  and Zithromax  in the ED and was considered for admission to the hospital for further evaluation and treatment.  Assessment/Plan: Principal Problem:   Acute on chronic systolic CHF (congestive heart failure) (HCC) Active Problems:   CAD (coronary artery disease)   Essential hypertension   Hyperlipidemia   Constipation   PAF (paroxysmal atrial fibrillation) (HCC)   Type 2 diabetes mellitus with complication, without long-term current use of insulin  (HCC)   Ischemic cardiomyopathy   CKD (chronic kidney disease), stage II   CHF (congestive heart failure) (HCC)  Acute on chronic systolic CHF (congestive heart failure)  Continue CHF protocol.  Repeat 2D echocardiogram from 02/17/2023 showed LV ejection fraction of 25% with global hypokinesis and grade 3 diastolic dysfunction.   Cardiology on board and  patient has been put on fluid restriction, Jardiance , Lasix  IV has been transitioned to torsemide  p.o. daily.  Continue metoprolol .  Entresto  and spironolactone  on hold.  Was recently seen by cardiology office and was put on 2 L fluid restrictions per day.  Review of previous 2D echocardiogram from 12/2022 showed LV ejection fraction of 20 to 25% with regional wall motion abnormality and grade 2 diastolic dysfunction.  No significant improvement in cardiac function.   CKD (chronic kidney disease), stage II Serum creatinine of 1.2.  Will continue to monitor while on diuretics.   Ischemic cardiomyopathy/CAD No active chest pain.  Continue Plavix  Crestor , metoprolol    Type 2 diabetes mellitus with complication, without long-term current use of insulin  with hyperglycemia. Continue to hold the Glucophage .  Continue jardiance , SSI.  Latest hemoglobin A1c of 7.8.   PAF (paroxysmal atrial fibrillation Continue Bid eliquis , metoprolol  and amiodarone .  Rate controlled at this time   Constipation Continue lactulose  and Dulcolax   Hyperlipidemia Continue crestor  and ezetimibe    Essential hypertension Continue to hold Entresto , spironolactone  due to borderline low blood pressure.  Continue metoprolol , Jardiance .  DVT prophylaxis: SCDs Start: 02/17/23 0457 apixaban  (ELIQUIS ) tablet 5 mg   Disposition: Likely home on 02/20/2023 if okay with cardiology.  Status is: Inpatient Remains inpatient appropriate because: Decompensated heart failure, medication titration, pending clinical improvement   Code Status:     Code Status: Full Code  Family Communication: Spoke with patient's sister on 02/17/2023  Consultants: Cardiology  Procedures: None  Anti-infectives:  None  Anti-infectives (From admission, onward)    Start     Dose/Rate Route Frequency  Ordered Stop   02/17/23 0200  azithromycin  (ZITHROMAX ) tablet 500 mg        500 mg Oral  Once 02/17/23 0157 02/17/23 0203   02/16/23 2345   cefTRIAXone  (ROCEPHIN ) 1 g in sodium chloride  0.9 % 100 mL IVPB        1 g 200 mL/hr over 30 Minutes Intravenous  Once 02/16/23 2342 02/17/23 0104       Subjective: Today, patient was seen and examined at bedside.  Patient states that he feels better with his breathing.  Denies any pain, dizziness, lightheadedness.  Swelling in the legs have gone down.  Objective: Vitals:   02/19/23 0728 02/19/23 1109  BP: 92/76 90/65  Pulse: 74 75  Resp: 17 17  Temp: 97.7 F (36.5 C) 98.1 F (36.7 C)  SpO2: 100%     Intake/Output Summary (Last 24 hours) at 02/19/2023 1114 Last data filed at 02/19/2023 0817 Gross per 24 hour  Intake 797 ml  Output 600 ml  Net 197 ml   Filed Weights   02/16/23 2226 02/18/23 0412 02/19/23 0332  Weight: 73.9 kg 70.2 kg 68.4 kg   Body mass index is 20.45 kg/m.   Physical Exam:  GENERAL: Patient is alert awake and oriented. Not in obvious distress. HENT: No scleral pallor or icterus. Pupils equally reactive to light. Oral mucosa is moist NECK: is supple, no gross swelling noted. CHEST:  Diminished breath sounds bilaterally.  No crackles heard today. CVS: S1 and S2 heard, no murmur. Regular rate and rhythm.  ABDOMEN: Soft, non-tender, bowel sounds are present. EXTREMITIES: Trace edema over the lower extremity. CNS: Cranial nerves are intact. No focal motor deficits. SKIN: warm and dry without rashes.  Data Review: I have personally reviewed the following laboratory data and studies,  CBC: Recent Labs  Lab 02/16/23 2233 02/19/23 0258  WBC 5.8 6.4  HGB 14.6 14.4  HCT 46.0 43.7  MCV 91.3 89.4  PLT 280 244   Basic Metabolic Panel: Recent Labs  Lab 02/16/23 2233 02/17/23 2003 02/18/23 0234 02/19/23 0258  NA 134* 137 137 138  K 4.3 3.9 4.2 3.8  CL 101 100 102 99  CO2 23 25 21* 24  GLUCOSE 193* 201* 135* 135*  BUN 13 11 10 12   CREATININE 1.10 1.21 1.24 1.26*  CALCIUM  9.5 9.6 9.4 9.7  MG  --  2.1 2.0 2.0   Liver Function Tests: Recent Labs   Lab 02/18/23 0234  AST 16  ALT 12  ALKPHOS 39  BILITOT 0.8  PROT 7.0  ALBUMIN  3.6   No results for input(s): LIPASE, AMYLASE in the last 168 hours. No results for input(s): AMMONIA in the last 168 hours. Cardiac Enzymes: No results for input(s): CKTOTAL, CKMB, CKMBINDEX, TROPONINI in the last 168 hours. BNP (last 3 results) Recent Labs    12/27/22 1526 02/06/23 1025 02/16/23 2233  BNP 349.9* 586.4* 710.8*    ProBNP (last 3 results) No results for input(s): PROBNP in the last 8760 hours.  CBG: Recent Labs  Lab 02/18/23 1151 02/18/23 1634 02/18/23 2115 02/19/23 0608 02/19/23 1112  GLUCAP 206* 170* 207* 148* 361*   Recent Results (from the past 240 hours)  Resp panel by RT-PCR (RSV, Flu A&B, Covid) Anterior Nasal Swab     Status: None   Collection Time: 02/16/23 10:34 PM   Specimen: Anterior Nasal Swab  Result Value Ref Range Status   SARS Coronavirus 2 by RT PCR NEGATIVE NEGATIVE Final   Influenza A by PCR NEGATIVE  NEGATIVE Final   Influenza B by PCR NEGATIVE NEGATIVE Final    Comment: (NOTE) The Xpert Xpress SARS-CoV-2/FLU/RSV plus assay is intended as an aid in the diagnosis of influenza from Nasopharyngeal swab specimens and should not be used as a sole basis for treatment. Nasal washings and aspirates are unacceptable for Xpert Xpress SARS-CoV-2/FLU/RSV testing.  Fact Sheet for Patients: bloggercourse.com  Fact Sheet for Healthcare Providers: seriousbroker.it  This test is not yet approved or cleared by the [place]  FDA and has been authorized for detection and/or diagnosis of SARS-CoV-2 by FDA under an Emergency Use Authorization (EUA). This EUA will remain in effect (meaning this test can be used) for the duration of the COVID-19 declaration under Section 564(b)(1) of the Act, 21 U.S.C. section 360bbb-3(b)(1), unless the authorization is terminated or revoked.     Resp  Syncytial Virus by PCR NEGATIVE NEGATIVE Final    Comment: (NOTE) Fact Sheet for Patients: bloggercourse.com  Fact Sheet for Healthcare Providers: seriousbroker.it  This test is not yet approved or cleared by the [place]  FDA and has been authorized for detection and/or diagnosis of SARS-CoV-2 by FDA under an Emergency Use Authorization (EUA). This EUA will remain in effect (meaning this test can be used) for the duration of the COVID-19 declaration under Section 564(b)(1) of the Act, 21 U.S.C. section 360bbb-3(b)(1), unless the authorization is terminated or revoked.  Performed at [facility], [address], [place], [place] [address]      Studies: LONG TERM MONITOR (3-14 DAYS) Result Date: 02/17/2023 Zio patch monitor 14 days 01/25/2023 - 02/08/2023: Dominant rhythm: Sinus. HR 59-91 bpm. Avg HR 69 bpm, in sinus rhythm. 2 episodes of SVT/atrial tachycardia, fastest and longest at 118 bpm for 15 beats. <1% isolated SVE, couplet/triplets. 0 episodes of VT. <1% isolated VE, couplets. No atrial fibrillation/atrial flutter/VT/high grade AV block, sinus pause >3sec noted. 3 patient triggered events, correlated with SVE/VE.   ECHOCARDIOGRAM COMPLETE Result Date: 02/17/2023    ECHOCARDIOGRAM REPORT   Patient Name:   Garrett Roberts Date of Exam: 02/17/2023 Medical Rec #:  [id number]            Height:       72.0 in Accession #:    [id number]           Weight:       163.0 lb Date of Birth:  06-23-1955           BSA:          1.953 m Patient Age:    41 years             BP:           104/76 mmHg Patient Gender: M                    HR:           70 bpm. Exam Location:  Inpatient Procedure: 2D Echo, Cardiac Doppler, Color Doppler, Strain Analysis and            Intracardiac Opacification Agent Indications:    CHF- Acute Systolic  History:        Patient has prior history of Echocardiogram examinations, most                 recent 12/11/2022.  CAD; Risk Factors:Hypertension and Diabetes.  Sonographer:    Ozell Free Referring Phys: [phone] ERIC CHEN  Sonographer Comments: Global longitudinal strain was attempted. IMPRESSIONS  1. No LV thrombus  noted. Left ventricular ejection fraction, by estimation, is 25%. The left ventricle has severely decreased function. The left ventricle demonstrates global hypokinesis. The left ventricular internal cavity size was mildly dilated. Left ventricular diastolic parameters are consistent with Grade III diastolic dysfunction (restrictive). Elevated left atrial pressure.  2. Right ventricular systolic function is mildly reduced. The right ventricular size is mildly enlarged. There is moderately elevated pulmonary artery systolic pressure. The estimated right ventricular systolic pressure is 59.9 mmHg.  3. Left atrial size was moderately dilated.  4. Right atrial size was mildly dilated.  5. The mitral valve is abnormal. Moderate functional mitral valve regurgitation. No evidence of mitral stenosis.  6. The aortic valve is tricuspid. Aortic valve regurgitation is not visualized. No aortic stenosis is present.  7. Pleural effusion noted. A small pericardial effusion is present. The pericardial effusion is localized near the right ventricle.  8. The inferior vena cava is normal in size with greater than 50% respiratory variability, suggesting right atrial pressure of 3 mmHg. FINDINGS  Left Ventricle: No LV thrombus noted. Left ventricular ejection fraction, by estimation, is 25%. The left ventricle has severely decreased function. The left ventricle demonstrates global hypokinesis. Definity  contrast agent was given IV to delineate the left ventricular endocardial borders. The left ventricular internal cavity size was mildly dilated. There is no left ventricular hypertrophy. Left ventricular diastolic parameters are consistent with Grade III diastolic dysfunction (restrictive). Elevated left atrial pressure. Right Ventricle: The  right ventricular size is mildly enlarged. No increase in right ventricular wall thickness. Right ventricular systolic function is mildly reduced. There is moderately elevated pulmonary artery systolic pressure. The tricuspid regurgitant velocity is 3.77 m/s, and with an assumed right atrial pressure of 3 mmHg, the estimated right ventricular systolic pressure is 59.9 mmHg. Left Atrium: Left atrial size was moderately dilated. Right Atrium: Right atrial size was mildly dilated. Pericardium: Pleural effusion noted. A small pericardial effusion is present. The pericardial effusion is localized near the right ventricle. Mitral Valve: The mitral valve is abnormal. Moderate mitral valve regurgitation. No evidence of mitral valve stenosis. MV peak gradient, 8.2 mmHg. The mean mitral valve gradient is 2.0 mmHg. Tricuspid Valve: The tricuspid valve is normal in structure. Tricuspid valve regurgitation is mild. Aortic Valve: The aortic valve is tricuspid. Aortic valve regurgitation is not visualized. No aortic stenosis is present. Aortic valve mean gradient measures 3.0 mmHg. Aortic valve peak gradient measures 6.2 mmHg. Aortic valve area, by VTI measures 1.78 cm. Pulmonic Valve: The pulmonic valve was normal in structure. Pulmonic valve regurgitation is trivial. Aorta: The aortic root is normal in size and structure. Venous: The inferior vena cava is normal in size with greater than 50% respiratory variability, suggesting right atrial pressure of 3 mmHg. IAS/Shunts: No atrial level shunt detected by color flow Doppler.  LEFT VENTRICLE PLAX 2D LVIDd:         5.60 cm      Diastology LVIDs:         4.30 cm      LV e' medial:    4.03 cm/s LV PW:         0.90 cm      LV E/e' medial:  30.0 LV IVS:        1.10 cm      LV e' lateral:   5.22 cm/s LVOT diam:     1.90 cm      LV E/e' lateral: 23.2 LV SV:         41 LV  SV Index:   21 LVOT Area:     2.84 cm  LV Volumes (MOD) LV vol d, MOD A2C: 169.0 ml LV vol d, MOD A4C: 165.0 ml LV  vol s, MOD A2C: 107.0 ml LV vol s, MOD A4C: 114.0 ml LV SV MOD A2C:     62.0 ml LV SV MOD A4C:     165.0 ml LV SV MOD BP:      57.0 ml RIGHT VENTRICLE             IVC RV Basal diam:  5.20 cm     IVC diam: 2.00 cm RV S prime:     11.30 cm/s TAPSE (M-mode): 2.0 cm LEFT ATRIUM              Index        RIGHT ATRIUM           Index LA diam:        4.70 cm  2.41 cm/m   RA Area:     20.10 cm LA Vol (A2C):   118.0 ml 60.42 ml/m  RA Volume:   66.80 ml  34.20 ml/m LA Vol (A4C):   67.8 ml  34.71 ml/m LA Biplane Vol: 90.4 ml  46.28 ml/m  AORTIC VALVE AV Area (Vmax):    1.85 cm AV Area (Vmean):   1.69 cm AV Area (VTI):     1.78 cm AV Vmax:           124.00 cm/s AV Vmean:          82.800 cm/s AV VTI:            0.233 m AV Peak Grad:      6.2 mmHg AV Mean Grad:      3.0 mmHg LVOT Vmax:         81.00 cm/s LVOT Vmean:        49.300 cm/s LVOT VTI:          0.146 m LVOT/AV VTI ratio: 0.63  AORTA Ao Root diam: 3.20 cm Ao Asc diam:  3.60 cm MITRAL VALVE                TRICUSPID VALVE MV Area (PHT): 4.80 cm     TR Peak grad:   56.9 mmHg MV Area VTI:   1.41 cm     TR Vmax:        377.00 cm/s MV Peak grad:  8.2 mmHg MV Mean grad:  2.0 mmHg     SHUNTS MV Vmax:       1.43 m/s     Systemic VTI:  0.15 m MV Vmean:      69.2 cm/s    Systemic Diam: 1.90 cm MV Decel Time: 158 msec MR Peak grad: 55.4 mmHg MR Mean grad: 34.0 mmHg MR Vmax:      372.00 cm/s MR Vmean:     270.0 cm/s MV E velocity: 121.00 cm/s MV A velocity: 39.40 cm/s MV E/A ratio:  3.07 Dalton McleanMD Electronically signed by Ezra Kanner Signature Date/Time: 02/17/2023/2:47:49 PM    Final       Vernal Alstrom, MD  [facility] 02/19/2023  If 7PM-7AM, please contact night-coverage

## 2023-02-19 NOTE — Progress Notes (Signed)
   Rounding Note    Patient Name: Garrett Roberts Date of Encounter: 02/19/2023  [facility] Cardiologist: Garrett JINNY Lawrence, MD   Subjective   [name]. Tells me his breathing is normal this AM.  Vital Signs    Vitals:   02/18/23 2016 02/19/23 0332 02/19/23 0452 02/19/23 0728  BP: 101/73  101/67 92/76  Pulse:    74  Resp: 18  16 17   Temp: 98.7 F (37.1 C)  97.7 F (36.5 C) 97.7 F (36.5 C)  TempSrc: Oral  Oral Oral  SpO2: 92%  92% 100%  Weight:  68.4 kg    Height:        Intake/Output Summary (Last 24 hours) at 02/19/2023 0937 Last data filed at 02/19/2023 0817 Gross per 24 hour  Intake 797 ml  Output 600 ml  Net 197 ml      02/19/2023    3:32 AM 02/18/2023    4:12 AM 02/16/2023   10:26 PM  Last 3 Weights  Weight (lbs) 150 lb 12.7 oz 154 lb 12.2 oz 163 lb  Weight (kg) 68.4 kg 70.2 kg 73.936 kg      Telemetry    Brief salvos of slowly conducted AF. Mostly sinus. Sinus this AM. - Personally Reviewed  ECG     Personally Reviewed  Physical Exam   GEN: No acute distress.   Cardiac: RRR, no murmurs, rubs, or gallops. No edema. Respiratory:CTAB Psych: Normal affect   Assessment & Plan    #acute on chronic systolic heart failure #Ischemic cardiomyopathy #CAD Warm and euvolemic. - hold spiro. Can consider restart as outpatient. - keep K>4, Mg>2 - start torsemide  20mg  PO daily - cont plavix  - cont eliquis   #AF Cont amio Cont eliquis  Will need outpatient follow up re: TSH/FT4.  Anticipate discharge tomorrow.  Ole T. Cindie, MD, [facility], [facility] Cardiac Electrophysiology

## 2023-02-20 ENCOUNTER — Other Ambulatory Visit (HOSPITAL_COMMUNITY): Payer: Self-pay

## 2023-02-20 LAB — CBC
HCT: 47.5 % (ref 39.0–52.0)
Hemoglobin: 15.3 g/dL (ref 13.0–17.0)
MCH: 29.1 pg (ref 26.0–34.0)
MCHC: 32.2 g/dL (ref 30.0–36.0)
MCV: 90.5 fL (ref 80.0–100.0)
Platelets: 267 10*3/uL (ref 150–400)
RBC: 5.25 MIL/uL (ref 4.22–5.81)
RDW: 14 % (ref 11.5–15.5)
WBC: 7.1 10*3/uL (ref 4.0–10.5)
nRBC: 0 % (ref 0.0–0.2)

## 2023-02-20 LAB — BASIC METABOLIC PANEL
Anion gap: 20 — ABNORMAL HIGH (ref 5–15)
BUN: 13 mg/dL (ref 8–23)
CO2: 23 mmol/L (ref 22–32)
Calcium: 10.3 mg/dL (ref 8.9–10.3)
Chloride: 97 mmol/L — ABNORMAL LOW (ref 98–111)
Creatinine, Ser: 1.33 mg/dL — ABNORMAL HIGH (ref 0.61–1.24)
GFR, Estimated: 59 mL/min — ABNORMAL LOW (ref >60)
Glucose, Bld: 157 mg/dL — ABNORMAL HIGH (ref 70–99)
Potassium: 4.2 mmol/L (ref 3.5–5.1)
Sodium: 140 mmol/L (ref 135–145)

## 2023-02-20 LAB — MAGNESIUM: Magnesium: 2.1 mg/dL (ref 1.7–2.4)

## 2023-02-20 MED ORDER — TORSEMIDE 20 MG PO TABS
20.0000 mg | ORAL_TABLET | Freq: Every day | ORAL | 2 refills | Status: DC
  Filled 2023-02-20: qty 30, 30d supply, fill #0

## 2023-02-20 MED ORDER — METOPROLOL SUCCINATE ER 25 MG PO TB24: 12.5000 mg | ORAL_TABLET | Freq: Every day | ORAL | Status: DC

## 2023-02-20 NOTE — Telephone Encounter (Signed)
 Spoke to patient regarding Entresto  application; patient remarked that Jardiance  shipment has not yet been received. Contacted [facility] to confirm first fill. Representative confirmed that pt should receive first delivery by 7 pm tomorrow (02/21/2023).

## 2023-02-20 NOTE — Telephone Encounter (Signed)
 Spoke to patient by phone, he will bring a copy of 1040 to [facility] to be sent to [facility].

## 2023-02-20 NOTE — Discharge Summary (Signed)
 Physician Discharge Summary  Garrett Roberts WUJ:[id number] DOB: 12-29-1955 DOA: 02/16/2023  PCP: Pcp, No  Admit date: 02/16/2023 Discharge date: 02/20/2023  Admitted From: Home  Discharge disposition: Home   Recommendations for Outpatient Follow-Up:   Follow up with your primary care provider in one week.  Check CBC, BMP, magnesium in the next visit Follow-up with cardiology as scheduled by the clinic.  Discharge Diagnosis:   Principal Problem:   Acute on chronic systolic CHF (congestive heart failure) (HCC) Active Problems:   CAD (coronary artery disease)   Essential hypertension   Hyperlipidemia   Constipation   PAF (paroxysmal atrial fibrillation) (HCC)   Type 2 diabetes mellitus with complication, without long-term current use of insulin  (HCC)   Ischemic cardiomyopathy   CKD (chronic kidney disease), stage II   CHF (congestive heart failure) (HCC)   Discharge Condition: Improved.  Diet recommendation: Low sodium, heart healthy.  Carbohydrate-modified.  Fluid restriction 1500 mL/day.  Wound care: None.  Code status: Full.   History of Present Illness:   68 yo [place] male with past medical history of of ischemic cardiomyopathy, chronic systolic CHF LVEF 20%, STEMI in 11-2022, GERD, constipation, presented to ER with 2 weeks of  SOB, 2-3 days of orthopnea, cough and productive clear sputum. Pt has been losing weight since his heart attack in 11-2022. He has lost 12 lbs. Has noticed some swelling in his legs (L>R). He has not taken any lasix . Pt has spent the last 2 night trying to sleep upright on his couch.  In the ED patient had stable vitals.  Initial labs showed mild hyponatremia with sodium of 134.  Creatinine was 1.1.  Procalcitonin was less than 0.1 hemoglobin was 14.6.  BNP was slightly elevated at 170.  EKG showed normal sinus rhythm.  Chest x-ray showed Bronchiectasis and increasing coarse central interstitial changes in the lungs with possible  developing infiltrate in the right base. Changes likely to represent acute on chronic bronchitic changes with possible developing pneumonia.  Patient was given Rocephin  and Zithromax  in the ED and was considered for admission to the hospital for further evaluation and treatment.   Hospital Course:   Following conditions were addressed during hospitalization as listed below,  Acute on chronic systolic CHF (congestive heart failure)  Patient was continued on CHF protocol during hospitalization..  Repeat 2D echocardiogram from 02/17/2023 showed LV ejection fraction of 25% with global hypokinesis and grade 3 diastolic dysfunction.   Cardiology on board and patient has been put on fluid restriction, Jardiance , Lasix  IV has been transitioned to torsemide  p.o. daily.  Continue metoprolol .  Entresto  and spironolactone  on hold.  Was recently seen by cardiology office and was put on 2 L fluid restrictions per day.  Review of previous 2D echocardiogram from 12/2022 showed LV ejection fraction of 20 to 25% with regional wall motion abnormality and grade 2 diastolic dysfunction.  No significant improvement in cardiac function.  Patient will need to follow-up with cardiology as outpatient   CKD (chronic kidney disease), stage II Serum creatinine of 1.3.  Will continue to monitor while on diuretics.  Recommend follow-up with PCP and cardiology as outpatient.   Ischemic cardiomyopathy/CAD No active chest pain.  Continue Plavix  Crestor , metoprolol    Type 2 diabetes mellitus with complication, without long-term current use of insulin  with hyperglycemia.  Continue jardiance ,  Latest hemoglobin A1c of 7.8.   PAF (paroxysmal atrial fibrillation Continue Bid eliquis , metoprolol  and amiodarone .  Rate controlled at this time   Constipation Improved.  Hyperlipidemia Continue crestor  and ezetimibe    Essential hypertension Continue to hold Entresto , spironolactone  due to borderline low blood pressure.  Continue  metoprolol , Jardiance .  At this medications as outpatient with cardiology.  Disposition.  At this time, patient is stable for disposition home with outpatient PCP and cardiology follow-up.  Medical Consultants:  Cardiology  Procedures:    None Subjective:   Today, patient was seen and examined at bedside.  Continues to feel better with breathing.  Denies any chest pain, dizziness, shortness of breath leg swelling has improved.  Discharge Exam:   Vitals:   02/20/23 0558 02/20/23 0600  BP:  92/70  Pulse: 77   Resp: 20   Temp: (!) 97.4 F (36.3 C)   SpO2: 99%    Vitals:   02/19/23 1943 02/20/23 0047 02/20/23 0558 02/20/23 0600  BP: (!) 89/64 101/72  92/70  Pulse: 77 77 77   Resp: 20 20 20    Temp: (!) 97.4 F (36.3 C) (!) 97.4 F (36.3 C) (!) 97.4 F (36.3 C)   TempSrc: Oral Oral Oral   SpO2: 100% 100% 99%   Weight:   69.6 kg   Height:       Body mass index is 20.81 kg/m.  General: Alert awake, not in obvious distress HENT: pupils equally reacting to light,  No scleral pallor or icterus noted. Oral mucosa is moist.  Chest:  Clear breath sounds.  Diminished breath sounds bilaterally. No crackles or wheezes.  CVS: S1 &S2 heard. No murmur.  Regular rate and rhythm. Abdomen: Soft, nontender, nondistended.  Bowel sounds are heard.   Extremities: No cyanosis, clubbing or edema.  Peripheral pulses are palpable. Psych: Alert, awake and oriented, normal mood CNS:  No cranial nerve deficits.  Power equal in all extremities.   Skin: Warm and dry.  No rashes noted.  The results of significant diagnostics from this hospitalization (including imaging, microbiology, ancillary and laboratory) are listed below for reference.     Diagnostic Studies:   LONG TERM MONITOR (3-14 DAYS) Result Date: 02/17/2023 Zio patch monitor 14 days 01/25/2023 - 02/08/2023: Dominant rhythm: Sinus. HR 59-91 bpm. Avg HR 69 bpm, in sinus rhythm. 2 episodes of SVT/atrial tachycardia, fastest and longest at  118 bpm for 15 beats. <1% isolated SVE, couplet/triplets. 0 episodes of VT. <1% isolated VE, couplets. No atrial fibrillation/atrial flutter/VT/high grade AV block, sinus pause >3sec noted. 3 patient triggered events, correlated with SVE/VE.   ECHOCARDIOGRAM COMPLETE Result Date: 02/17/2023    ECHOCARDIOGRAM REPORT   Patient Name:   Garrett Roberts Date of Exam: 02/17/2023 Medical Rec #:  [id number]            Height:       72.0 in Accession #:    [id number]           Weight:       163.0 lb Date of Birth:  1955/06/09           BSA:          1.953 m Patient Age:    68 years             BP:           104/76 mmHg Patient Gender: M                    HR:           70 bpm. Exam Location:  Inpatient Procedure: 2D Echo, Cardiac Doppler, Color Doppler, Strain Analysis and  Intracardiac Opacification Agent Indications:    CHF- Acute Systolic  History:        Patient has prior history of Echocardiogram examinations, most                 recent 12/11/2022. CAD; Risk Factors:Hypertension and Diabetes.  Sonographer:    Reta Cassis Referring Phys: [phone] ERIC CHEN  Sonographer Comments: Global longitudinal strain was attempted. IMPRESSIONS  1. No LV thrombus noted. Left ventricular ejection fraction, by estimation, is 25%. The left ventricle has severely decreased function. The left ventricle demonstrates global hypokinesis. The left ventricular internal cavity size was mildly dilated. Left ventricular diastolic parameters are consistent with Grade III diastolic dysfunction (restrictive). Elevated left atrial pressure.  2. Right ventricular systolic function is mildly reduced. The right ventricular size is mildly enlarged. There is moderately elevated pulmonary artery systolic pressure. The estimated right ventricular systolic pressure is 59.9 mmHg.  3. Left atrial size was moderately dilated.  4. Right atrial size was mildly dilated.  5. The mitral valve is abnormal. Moderate functional mitral valve regurgitation. No  evidence of mitral stenosis.  6. The aortic valve is tricuspid. Aortic valve regurgitation is not visualized. No aortic stenosis is present.  7. Pleural effusion noted. A small pericardial effusion is present. The pericardial effusion is localized near the right ventricle.  8. The inferior vena cava is normal in size with greater than 50% respiratory variability, suggesting right atrial pressure of 3 mmHg. FINDINGS  Left Ventricle: No LV thrombus noted. Left ventricular ejection fraction, by estimation, is 25%. The left ventricle has severely decreased function. The left ventricle demonstrates global hypokinesis. Definity  contrast agent was given IV to delineate the left ventricular endocardial borders. The left ventricular internal cavity size was mildly dilated. There is no left ventricular hypertrophy. Left ventricular diastolic parameters are consistent with Grade III diastolic dysfunction (restrictive). Elevated left atrial pressure. Right Ventricle: The right ventricular size is mildly enlarged. No increase in right ventricular wall thickness. Right ventricular systolic function is mildly reduced. There is moderately elevated pulmonary artery systolic pressure. The tricuspid regurgitant velocity is 3.77 m/s, and with an assumed right atrial pressure of 3 mmHg, the estimated right ventricular systolic pressure is 59.9 mmHg. Left Atrium: Left atrial size was moderately dilated. Right Atrium: Right atrial size was mildly dilated. Pericardium: Pleural effusion noted. A small pericardial effusion is present. The pericardial effusion is localized near the right ventricle. Mitral Valve: The mitral valve is abnormal. Moderate mitral valve regurgitation. No evidence of mitral valve stenosis. MV peak gradient, 8.2 mmHg. The mean mitral valve gradient is 2.0 mmHg. Tricuspid Valve: The tricuspid valve is normal in structure. Tricuspid valve regurgitation is mild. Aortic Valve: The aortic valve is tricuspid. Aortic valve  regurgitation is not visualized. No aortic stenosis is present. Aortic valve mean gradient measures 3.0 mmHg. Aortic valve peak gradient measures 6.2 mmHg. Aortic valve area, by VTI measures 1.78 cm. Pulmonic Valve: The pulmonic valve was normal in structure. Pulmonic valve regurgitation is trivial. Aorta: The aortic root is normal in size and structure. Venous: The inferior vena cava is normal in size with greater than 50% respiratory variability, suggesting right atrial pressure of 3 mmHg. IAS/Shunts: No atrial level shunt detected by color flow Doppler.  LEFT VENTRICLE PLAX 2D LVIDd:         5.60 cm      Diastology LVIDs:         4.30 cm      LV e' medial:  4.03 cm/s LV PW:         0.90 cm      LV E/e' medial:  30.0 LV IVS:        1.10 cm      LV e' lateral:   5.22 cm/s LVOT diam:     1.90 cm      LV E/e' lateral: 23.2 LV SV:         41 LV SV Index:   21 LVOT Area:     2.84 cm  LV Volumes (MOD) LV vol d, MOD A2C: 169.0 ml LV vol d, MOD A4C: 165.0 ml LV vol s, MOD A2C: 107.0 ml LV vol s, MOD A4C: 114.0 ml LV SV MOD A2C:     62.0 ml LV SV MOD A4C:     165.0 ml LV SV MOD BP:      57.0 ml RIGHT VENTRICLE             IVC RV Basal diam:  5.20 cm     IVC diam: 2.00 cm RV S prime:     11.30 cm/s TAPSE (M-mode): 2.0 cm LEFT ATRIUM              Index        RIGHT ATRIUM           Index LA diam:        4.70 cm  2.41 cm/m   RA Area:     20.10 cm LA Vol (A2C):   118.0 ml 60.42 ml/m  RA Volume:   66.80 ml  34.20 ml/m LA Vol (A4C):   67.8 ml  34.71 ml/m LA Biplane Vol: 90.4 ml  46.28 ml/m  AORTIC VALVE AV Area (Vmax):    1.85 cm AV Area (Vmean):   1.69 cm AV Area (VTI):     1.78 cm AV Vmax:           124.00 cm/s AV Vmean:          82.800 cm/s AV VTI:            0.233 m AV Peak Grad:      6.2 mmHg AV Mean Grad:      3.0 mmHg LVOT Vmax:         81.00 cm/s LVOT Vmean:        49.300 cm/s LVOT VTI:          0.146 m LVOT/AV VTI ratio: 0.63  AORTA Ao Root diam: 3.20 cm Ao Asc diam:  3.60 cm MITRAL VALVE                 TRICUSPID VALVE MV Area (PHT): 4.80 cm     TR Peak grad:   56.9 mmHg MV Area VTI:   1.41 cm     TR Vmax:        377.00 cm/s MV Peak grad:  8.2 mmHg MV Mean grad:  2.0 mmHg     SHUNTS MV Vmax:       1.43 m/s     Systemic VTI:  0.15 m MV Vmean:      69.2 cm/s    Systemic Diam: 1.90 cm MV Decel Time: 158 msec MR Peak grad: 55.4 mmHg MR Mean grad: 34.0 mmHg MR Vmax:      372.00 cm/s MR Vmean:     270.0 cm/s MV E velocity: 121.00 cm/s MV A velocity: 39.40 cm/s MV E/A ratio:  3.07 Dalton McleanMD Electronically signed by Archer Bear Signature Date/Time: 02/17/2023/2:47:49  PM    Final    DG Chest 2 View Result Date: 02/16/2023 CLINICAL DATA:  Chest pain and cough. EXAM: CHEST - 2 VIEW COMPARISON:  12/10/2022 FINDINGS: Heart size and pulmonary vascularity are normal. Coarse interstitial changes in the lungs with mostly perihilar and basilar distribution. This is mildly progressed since prior study with focal infiltration seen in the right lung base. Mild bronchiectasis. Changes likely represent acute on chronic bronchitic changes with possible developing pneumonia. Subcentimeter nodule seen previously in the left upper lung is unchanged, likely calcified granuloma. No pleural effusion or pneumothorax. Mediastinal contours appear intact. Degenerative changes in the spine and shoulders. IMPRESSION: Bronchiectasis and increasing coarse central interstitial changes in the lungs with possible developing infiltrate in the right base. Changes likely to represent acute on chronic bronchitic changes with possible developing pneumonia. Electronically Signed   By: Boyce Byes M.D.   On: 02/16/2023 22:59     Labs:   Basic Metabolic Panel: Recent Labs  Lab 02/16/23 2233 02/17/23 2003 02/18/23 0234 02/19/23 0258 02/20/23 0310  NA 134* 137 137 138 140  K 4.3 3.9 4.2 3.8 4.2  CL 101 100 102 99 97*  CO2 23 25 21* 24 23  GLUCOSE 193* 201* 135* 135* 157*  BUN 13 11 10 12 13   CREATININE 1.10 1.21 1.24 1.26* 1.33*   CALCIUM  9.5 9.6 9.4 9.7 10.3  MG  --  2.1 2.0 2.0 2.1   GFR Estimated Creatinine Clearance: 53.1 mL/min (A) (by C-G formula based on SCr of 1.33 mg/dL (H)). Liver Function Tests: Recent Labs  Lab 02/18/23 0234  AST 16  ALT 12  ALKPHOS 39  BILITOT 0.8  PROT 7.0  ALBUMIN  3.6   No results for input(s): "LIPASE", "AMYLASE" in the last 168 hours. No results for input(s): "AMMONIA" in the last 168 hours. Coagulation profile No results for input(s): "INR", "PROTIME" in the last 168 hours.  CBC: Recent Labs  Lab 02/16/23 2233 02/19/23 0258 02/20/23 0310  WBC 5.8 6.4 7.1  HGB 14.6 14.4 15.3  HCT 46.0 43.7 47.5  MCV 91.3 89.4 90.5  PLT 280 244 267   Cardiac Enzymes: No results for input(s): "CKTOTAL", "CKMB", "CKMBINDEX", "TROPONINI" in the last 168 hours. BNP: Invalid input(s): "POCBNP" CBG: Recent Labs  Lab 02/18/23 2115 02/19/23 0608 02/19/23 1112 02/19/23 1608 02/19/23 2115  GLUCAP 207* 148* 361* 118* 191*   D-Dimer No results for input(s): "DDIMER" in the last 72 hours. Hgb A1c No results for input(s): "HGBA1C" in the last 72 hours. Lipid Profile No results for input(s): "CHOL", "HDL", "LDLCALC", "TRIG", "CHOLHDL", "LDLDIRECT" in the last 72 hours. Thyroid  function studies No results for input(s): "TSH", "T4TOTAL", "T3FREE", "THYROIDAB" in the last 72 hours.  Invalid input(s): "FREET3" Anemia work up No results for input(s): "VITAMINB12", "FOLATE", "FERRITIN", "TIBC", "IRON", "RETICCTPCT" in the last 72 hours. Microbiology Recent Results (from the past 240 hours)  Resp panel by RT-PCR (RSV, Flu A&B, Covid) Anterior Nasal Swab     Status: None   Collection Time: 02/16/23 10:34 PM   Specimen: Anterior Nasal Swab  Result Value Ref Range Status   SARS Coronavirus 2 by RT PCR NEGATIVE NEGATIVE Final   Influenza A by PCR NEGATIVE NEGATIVE Final   Influenza B by PCR NEGATIVE NEGATIVE Final    Comment: (NOTE) The Xpert Xpress SARS-CoV-2/FLU/RSV plus assay is  intended as an aid in the diagnosis of influenza from Nasopharyngeal swab specimens and should not be used as a sole basis for treatment. Nasal  washings and aspirates are unacceptable for Xpert Xpress SARS-CoV-2/FLU/RSV testing.  Fact Sheet for Patients: BloggerCourse.com  Fact Sheet for Healthcare Providers: SeriousBroker.it  This test is not yet approved or cleared by the [place]  FDA and has been authorized for detection and/or diagnosis of SARS-CoV-2 by FDA under an Emergency Use Authorization (EUA). This EUA will remain in effect (meaning this test can be used) for the duration of the COVID-19 declaration under Section 564(b)(1) of the Act, 21 U.S.C. section 360bbb-3(b)(1), unless the authorization is terminated or revoked.     Resp Syncytial Virus by PCR NEGATIVE NEGATIVE Final    Comment: (NOTE) Fact Sheet for Patients: BloggerCourse.com  Fact Sheet for Healthcare Providers: SeriousBroker.it  This test is not yet approved or cleared by the [place]  FDA and has been authorized for detection and/or diagnosis of SARS-CoV-2 by FDA under an Emergency Use Authorization (EUA). This EUA will remain in effect (meaning this test can be used) for the duration of the COVID-19 declaration under Section 564(b)(1) of the Act, 21 U.S.C. section 360bbb-3(b)(1), unless the authorization is terminated or revoked.  Performed at [facility], [address], [place], [place] [address]      Discharge Instructions:   Discharge Instructions     (HEART FAILURE PATIENTS) Call MD:  Anytime you have any of the following symptoms: 1) 3 pound weight gain in 24 hours or 5 pounds in 1 week 2) shortness of breath, with or without a dry hacking cough 3) swelling in the hands, feet or stomach 4) if you have to sleep on extra pillows at night in order to breathe.   Complete by: As  directed    Diet - low sodium heart healthy   Complete by: As directed    Discharge instructions   Complete by: As directed    Follow-up with your primary care provider in 1 week.  Check blood work at that time.  Seek medical attention for worsening symptoms.  Follow-up with your cardiology as scheduled by the clinic.  Avoid overexertion.  Take medications as prescribed medications have changed.  Please take a note.  Fluid restriction 1500 mL/day.   Heart Failure patients record your daily weight using the same scale at the same time of day   Complete by: As directed    Increase activity slowly   Complete by: As directed       Allergies as of 02/20/2023       Reactions   Beef-derived Drug Products Other (See Comments)   Religious observance   Pork-derived Products Other (See Comments)   Religious observance        Medication List     STOP taking these medications    Entresto  24-26 MG Generic drug: sacubitril-valsartan   furosemide  20 MG tablet Commonly known as: Lasix    spironolactone  25 MG tablet Commonly known as: ALDACTONE        TAKE these medications    amiodarone  200 MG tablet Commonly known as: PACERONE  Take 1 tablet (200 mg total) by mouth daily.   apixaban  5 MG Tabs tablet Commonly known as: ELIQUIS  Take 1 tablet (5 mg total) by mouth 2 (two) times daily.   B-12 PO Take 1 tablet by mouth daily with lunch.   clopidogrel  75 MG tablet Commonly known as: PLAVIX  Take 1 tablet (75 mg total) by mouth daily.   empagliflozin  10 MG Tabs tablet Commonly known as: Jardiance  Take 1 tablet (10 mg total) by mouth daily before breakfast.   ezetimibe   10 MG tablet Commonly known as: ZETIA  Take 1 tablet (10 mg total) by mouth daily.   MAGNESIUM PO Take 1 tablet by mouth at bedtime.   metFORMIN  500 MG tablet Commonly known as: GLUCOPHAGE  Take 1 tablet (500 mg total) by mouth daily with breakfast. Courtesy fill- Additional refills will need to come from PCP    metoprolol  succinate 25 MG 24 hr tablet Commonly known as: TOPROL -XL Take 0.5 tablets (12.5 mg total) by mouth daily. What changed: how much to take   MULTIVITAMIN PO Take 1 tablet by mouth daily with lunch.   nitroGLYCERIN  0.4 MG SL tablet Commonly known as: NITROSTAT  Place 1 tablet (0.4 mg total) under the tongue every 5 (five) minutes x 3 doses as needed for chest pain.   pantoprazole  40 MG tablet Commonly known as: PROTONIX  Take 1 tablet (40 mg total) by mouth 2 (two) times daily before a meal.   rosuvastatin  40 MG tablet Commonly known as: CRESTOR  Take 1 tablet (40 mg total) by mouth daily.   torsemide  20 MG tablet Commonly known as: DEMADEX  Take 1 tablet (20 mg total) by mouth daily.   VITAMIN C PO Take 1 tablet by mouth daily with lunch.   VITAMIN K2-VITAMIN D3 PO Take 1 tablet by mouth daily with lunch.   ZINC PO Take 1 tablet by mouth daily after lunch.        Follow-up Information     primary care provider Follow up in 1 week(s).                   Time coordinating discharge: 39 minutes  Signed:  Malania Gawthrop  [facility] 02/20/2023, 10:26 AM

## 2023-02-20 NOTE — Progress Notes (Signed)
 Rounding Note    Patient Name: Garrett Roberts Date of Encounter: 02/20/2023  [facility] Cardiologist: Cody Das, MD   Subjective   No complaints today. No dyspnea or chest pain.   Inpatient Medications    Scheduled Meds:  amiodarone   200 mg Oral Daily   apixaban   5 mg Oral BID   bisacodyl   10 mg Oral Daily   clopidogrel   75 mg Oral Daily   empagliflozin   10 mg Oral QAC breakfast   ezetimibe   10 mg Oral Daily   insulin  aspart  0-5 Units Subcutaneous QHS   insulin  aspart  0-9 Units Subcutaneous TID WC   lactulose   30 g Oral Daily   metoprolol  succinate  12.5 mg Oral Daily   pantoprazole   40 mg Oral BID AC   potassium chloride   20 mEq Oral Daily   rosuvastatin   40 mg Oral Daily   torsemide   20 mg Oral Daily   Continuous Infusions:  PRN Meds: acetaminophen , albuterol , ondansetron  (ZOFRAN ) IV, zolpidem    Vital Signs    Vitals:   02/19/23 1611 02/19/23 1943 02/20/23 0047 02/20/23 0558  BP: 93/68 (!) 89/64 101/72   Pulse: 77 77 77 77  Resp: 20 20 20 20   Temp: 98.7 F (37.1 C) (!) 97.4 F (36.3 C) (!) 97.4 F (36.3 C) (!) 97.4 F (36.3 C)  TempSrc: Oral Oral Oral Oral  SpO2: 100% 100% 100% 99%  Weight:    69.6 kg  Height:        Intake/Output Summary (Last 24 hours) at 02/20/2023 0752 Last data filed at 02/19/2023 2110 Gross per 24 hour  Intake 597 ml  Output --  Net 597 ml      02/20/2023    5:58 AM 02/19/2023    3:32 AM 02/18/2023    4:12 AM  Last 3 Weights  Weight (lbs) 153 lb 7 oz 150 lb 12.7 oz 154 lb 12.2 oz  Weight (kg) 69.6 kg 68.4 kg 70.2 kg      Telemetry    sinus - Personally Reviewed  ECG    No am tracing - Personally Reviewed  Physical Exam   GEN: No acute distress.   Neck: No JVD Cardiac: RRR, no murmurs, rubs, or gallops.  Respiratory: Clear to auscultation bilaterally. GI: Soft, nontender, non-distended  MS: No edema; No deformity. Neuro:  Nonfocal  Psych: Normal affect   Labs    High Sensitivity  Troponin:   Recent Labs  Lab 02/16/23 2233 02/17/23 0020  TROPONINIHS 23* 23*     Chemistry Recent Labs  Lab 02/18/23 0234 02/19/23 0258 02/20/23 0310  NA 137 138 140  K 4.2 3.8 4.2  CL 102 99 97*  CO2 21* 24 23  GLUCOSE 135* 135* 157*  BUN 10 12 13   CREATININE 1.24 1.26* 1.33*  CALCIUM  9.4 9.7 10.3  MG 2.0 2.0 2.1  PROT 7.0  --   --   ALBUMIN  3.6  --   --   AST 16  --   --   ALT 12  --   --   ALKPHOS 39  --   --   BILITOT 0.8  --   --   GFRNONAA >60 >60 59*  ANIONGAP 14 15 20*    Lipids No results for input(s): "CHOL", "TRIG", "HDL", "LABVLDL", "LDLCALC", "CHOLHDL" in the last 168 hours.  Hematology Recent Labs  Lab 02/16/23 2233 02/19/23 0258 02/20/23 0310  WBC 5.8 6.4 7.1  RBC 5.04 4.89 5.25  HGB 14.6 14.4 15.3  HCT 46.0 43.7 47.5  MCV 91.3 89.4 90.5  MCH 29.0 29.4 29.1  MCHC 31.7 33.0 32.2  RDW 14.1 13.9 14.0  PLT 280 244 267   Thyroid  No results for input(s): "TSH", "FREET4" in the last 168 hours.  BNP Recent Labs  Lab 02/16/23 2233  BNP 710.8*    DDimer No results for input(s): "DDIMER" in the last 168 hours.   Radiology    No results found.  Cardiac Studies     Patient Profile     68 y.o. male with history of HTN, HLD, DM, CAD, ischemic cardiomyopathy admitted with CHF exacerbation.   Assessment & Plan     Acute on chronic systolic CHF: He appears euvolemic today. His BP will not support restarting Entresto  or aldactone . Continue Toprol . Continue torsemide  20 mg daily CAD: No chest pain. Continue Plavix , Crestor , Zetia , Toprol  PAF: Continue Toprol , amiodarone  and Eliquis .   OK to discharge home from cardiac perspective. Cardiac follow up appointments outlined in system.   For questions or updates, please contact [facility] Please consult [email or web address] for contact info under     Signed, Antoinette Batman, MD  02/20/2023, 7:52 AM

## 2023-02-20 NOTE — TOC Initial Note (Signed)
 Transition of Care [facility]) - Initial/Assessment Note    Patient Details  Name: Garrett Roberts MRN: [id number] Date of Birth: 11/14/1955  [facility]) CM/SW Contact:    Jennett Model, RN Phone Number: 02/20/2023, 10:42 AM  Clinical Narrative:                 From home with spouse, has PCP and insurance on file, states has no HH services in place at this time or DME at home.  States family member will transport them home at [employer] and family is support system, states gets medications from [employer].  Pta self ambulatory.  Expected Discharge Plan: Home/Self Care Barriers to Discharge: No Barriers Identified   Patient Goals and CMS Choice Patient states their goals for this hospitalization and ongoing recovery are:: to return home   Choice offered to / list presented to : NA      Expected Discharge Plan and Services In-house Referral: NA Discharge Planning Services: CM Consult Post Acute Care Choice: NA Living arrangements for the past 2 months: Single Family Home Expected Discharge Date: 02/20/23               DME Arranged: N/A DME Agency: NA       HH Arranged: NA          Prior Living Arrangements/Services Living arrangements for the past 2 months: Single Family Home Lives with:: Spouse Patient language and need for interpreter reviewed:: Yes Do you feel safe going back to the place where you live?: Yes      Need for Family Participation in Patient Care: No (Comment) Care giver support system in place?: No (comment)   Criminal Activity/Legal Involvement Pertinent to Current Situation/Hospitalization: No - Comment as needed  Activities of Daily Living   ADL Screening (condition at time of admission) Independently performs ADLs?: Yes (appropriate for developmental age) Is the patient deaf or have difficulty hearing?: No Does the patient have difficulty seeing, even when wearing glasses/contacts?: No Does the patient have difficulty  concentrating, remembering, or making decisions?: No  Permission Sought/Granted Permission sought to share information with : Case Manager Permission granted to share information with : Yes, Verbal Permission Granted              Emotional Assessment Appearance:: Appears stated age Attitude/Demeanor/Rapport: Engaged Affect (typically observed): Appropriate Orientation: : Oriented to Self, Oriented to Place, Oriented to  Time, Oriented to Situation Alcohol / Substance Use: Not Applicable Psych Involvement: No (comment)  Admission diagnosis:  CHF (congestive heart failure) (HCC) [I50.9] Acute on chronic systolic CHF (congestive heart failure) (HCC) [I50.23] Community acquired pneumonia of right lower lobe of lung [J18.9] Acute on chronic congestive heart failure, unspecified heart failure type (HCC) [I50.9] Patient Active Problem List   Diagnosis Date Noted   Acute on chronic systolic CHF (congestive heart failure) (HCC) 02/17/2023   CHF (congestive heart failure) (HCC) 02/17/2023   Ischemic cardiomyopathy 12/19/2022   Chronic HFrEF (heart failure with reduced ejection fraction) (HCC) 12/19/2022   CKD (chronic kidney disease), 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   Type 2 diabetes mellitus with complication, without long-term current use of insulin  (HCC) 12/15/2022   PAF (paroxysmal atrial fibrillation) (HCC) 12/14/2022   Constipation 12/12/2022   Abdominal pain 12/11/2022   Abnormal CT scan, stomach 12/11/2022   CAD (coronary artery disease) 10/11/2022   Essential hypertension 10/11/2022   Hyperlipidemia 10/11/2022   PURE HYPERCHOLESTEROLEMIA 09/23/2008   PCP:  Pcp, No Pharmacy:   [facility] [address] [place] [address] Phone: [phone] Fax: [phone]     Social Drivers of Health (SDOH) Social History: SDOH Screenings   Food Insecurity: No Food Insecurity (02/17/2023)  Housing: Low Risk  (02/17/2023)  Transportation Needs: No  Transportation Needs (02/17/2023)  Utilities: Patient Declined (02/17/2023)  Alcohol Screen: Low Risk  (12/13/2022)  Financial Resource Strain: High Risk (12/13/2022)  Physical Activity: Sufficiently Active (11/27/2019)   Received from [facility]  Social Connections: Patient Declined (02/17/2023)  Stress: Stress Concern Present (11/27/2019)   Received from [facility], [facility]  Tobacco Use: Low Risk  (02/16/2023)   SDOH Interventions:     Readmission Risk Interventions     No data to display

## 2023-02-20 NOTE — TOC Transition Note (Signed)
 Transition of Care [facility]) - Discharge Note   Patient Details  Name: Garrett Roberts MRN: [id number] Date of Birth: 1955-03-09  Transition of Care [facility]) CM/SW Contact:  Jennett Model, RN Phone Number: 02/20/2023, 10:45 AM   Clinical Narrative:    For dc , daughter will transport him home. Has no needs.   Final next level of care: Home/Self Care Barriers to Discharge: No Barriers Identified   Patient Goals and CMS Choice Patient states their goals for this hospitalization and ongoing recovery are:: to return home   Choice offered to / list presented to : NA      Discharge Placement                       Discharge Plan and Services Additional resources added to the After Visit Summary for   In-house Referral: NA Discharge Planning Services: CM Consult Post Acute Care Choice: NA          DME Arranged: N/A DME Agency: NA       HH Arranged: NA          Social Drivers of Health (SDOH) Interventions SDOH Screenings   Food Insecurity: No Food Insecurity (02/17/2023)  Housing: Low Risk  (02/17/2023)  Transportation Needs: No Transportation Needs (02/17/2023)  Utilities: Patient Declined (02/17/2023)  Alcohol Screen: Low Risk  (12/13/2022)  Financial Resource Strain: High Risk (12/13/2022)  Physical Activity: Sufficiently Active (11/27/2019)   Received from [facility]  Social Connections: Patient Declined (02/17/2023)  Stress: Stress Concern Present (11/27/2019)   Received from [facility], [facility]  Tobacco Use: Low Risk  (02/16/2023)     Readmission Risk Interventions     No data to display

## 2023-02-27 ENCOUNTER — Ambulatory Visit (HOSPITAL_COMMUNITY)
Admission: RE | Admit: 2023-02-27 | Discharge: 2023-02-27 | Disposition: A | Discharge status: Home / Self Care | Payer: MEDICAID | Source: Ambulatory Visit | Attending: Cardiology | Admitting: Cardiology

## 2023-02-27 ENCOUNTER — Other Ambulatory Visit: Payer: Self-pay

## 2023-02-27 VITALS — BP 94/62 | HR 71 | Wt 156.4 lb

## 2023-02-27 DIAGNOSIS — Z79899 Other long term (current) drug therapy: Secondary | ICD-10-CM | POA: Diagnosis not present

## 2023-02-27 DIAGNOSIS — I110 Hypertensive heart disease with heart failure: Secondary | ICD-10-CM | POA: Insufficient documentation

## 2023-02-27 DIAGNOSIS — I5022 Chronic systolic (congestive) heart failure: Secondary | ICD-10-CM | POA: Insufficient documentation

## 2023-02-27 DIAGNOSIS — B9681 Helicobacter pylori [H. pylori] as the cause of diseases classified elsewhere: Secondary | ICD-10-CM | POA: Diagnosis not present

## 2023-02-27 DIAGNOSIS — I255 Ischemic cardiomyopathy: Secondary | ICD-10-CM | POA: Diagnosis not present

## 2023-02-27 DIAGNOSIS — Z7984 Long term (current) use of oral hypoglycemic drugs: Secondary | ICD-10-CM | POA: Diagnosis not present

## 2023-02-27 DIAGNOSIS — I480 Paroxysmal atrial fibrillation: Secondary | ICD-10-CM | POA: Insufficient documentation

## 2023-02-27 DIAGNOSIS — E119 Type 2 diabetes mellitus without complications: Secondary | ICD-10-CM | POA: Diagnosis not present

## 2023-02-27 DIAGNOSIS — Z7902 Long term (current) use of antithrombotics/antiplatelets: Secondary | ICD-10-CM | POA: Diagnosis not present

## 2023-02-27 DIAGNOSIS — R0602 Shortness of breath: Secondary | ICD-10-CM | POA: Insufficient documentation

## 2023-02-27 DIAGNOSIS — I2510 Atherosclerotic heart disease of native coronary artery without angina pectoris: Secondary | ICD-10-CM | POA: Diagnosis not present

## 2023-02-27 DIAGNOSIS — I252 Old myocardial infarction: Secondary | ICD-10-CM | POA: Diagnosis not present

## 2023-02-27 DIAGNOSIS — Z7901 Long term (current) use of anticoagulants: Secondary | ICD-10-CM | POA: Diagnosis not present

## 2023-02-27 DIAGNOSIS — E785 Hyperlipidemia, unspecified: Secondary | ICD-10-CM | POA: Diagnosis not present

## 2023-02-27 LAB — BASIC METABOLIC PANEL
Anion gap: 11 (ref 5–15)
BUN: 24 mg/dL — ABNORMAL HIGH (ref 8–23)
CO2: 27 mmol/L (ref 22–32)
Calcium: 9.9 mg/dL (ref 8.9–10.3)
Chloride: 95 mmol/L — ABNORMAL LOW (ref 98–111)
Creatinine, Ser: 1.48 mg/dL — ABNORMAL HIGH (ref 0.61–1.24)
GFR, Estimated: 52 mL/min — ABNORMAL LOW (ref >60)
Glucose, Bld: 192 mg/dL — ABNORMAL HIGH (ref 70–99)
Potassium: 4.1 mmol/L (ref 3.5–5.1)
Sodium: 133 mmol/L — ABNORMAL LOW (ref 135–145)

## 2023-02-27 LAB — T4, FREE: Free T4: 1.1 ng/dL (ref 0.61–1.12)

## 2023-02-27 LAB — BRAIN NATRIURETIC PEPTIDE: B Natriuretic Peptide: 481.2 pg/mL — ABNORMAL HIGH (ref 0.0–100.0)

## 2023-02-27 NOTE — Patient Instructions (Signed)
It was a pleasure seeing you today!  MEDICATIONS: -No medication changes today -Call if you have questions about your medications.  LABS: -We will call you if your labs need attention.  NEXT APPOINTMENT: Return to clinic in 1 month with Dr. McLean.  In general, to take care of your heart failure: -Limit your fluid intake to 2 Liters (half-gallon) per day.   -Limit your salt intake to ideally 2-3 grams (2000-3000 mg) per day. -Weigh yourself daily and record, and bring that "weight diary" to your next appointment.  (Weight gain of 2-3 pounds in 1 day typically means fluid weight.) -The medications for your heart are to help your heart and help you live longer.   -Please contact us before stopping any of your heart medications.  Call the clinic at [phone] with questions or to reschedule future appointments.  

## 2023-02-28 LAB — T3, FREE: T3, Free: 2.6 pg/mL (ref 2.0–4.4)

## 2023-03-06 ENCOUNTER — Encounter (HOSPITAL_COMMUNITY): Payer: Self-pay | Admitting: Cardiology

## 2023-03-06 NOTE — Telephone Encounter (Signed)
 Spoke to patient by phone, he will bring in copy of 1040 to [facility] today.

## 2023-03-07 ENCOUNTER — Ambulatory Visit (HOSPITAL_COMMUNITY)
Admission: RE | Admit: 2023-03-07 | Discharge: 2023-03-07 | Disposition: A | Discharge status: Home / Self Care | Payer: MEDICAID | Source: Ambulatory Visit | Attending: Family Medicine | Admitting: Family Medicine

## 2023-03-07 ENCOUNTER — Other Ambulatory Visit (HOSPITAL_COMMUNITY): Payer: Self-pay

## 2023-03-07 ENCOUNTER — Encounter (HOSPITAL_COMMUNITY): Payer: Self-pay

## 2023-03-07 VITALS — BP 120/80 | HR 68 | Wt 153.4 lb

## 2023-03-07 DIAGNOSIS — Z7901 Long term (current) use of anticoagulants: Secondary | ICD-10-CM | POA: Diagnosis not present

## 2023-03-07 DIAGNOSIS — I255 Ischemic cardiomyopathy: Secondary | ICD-10-CM | POA: Insufficient documentation

## 2023-03-07 DIAGNOSIS — Z955 Presence of coronary angioplasty implant and graft: Secondary | ICD-10-CM | POA: Diagnosis not present

## 2023-03-07 DIAGNOSIS — I252 Old myocardial infarction: Secondary | ICD-10-CM | POA: Insufficient documentation

## 2023-03-07 DIAGNOSIS — I480 Paroxysmal atrial fibrillation: Secondary | ICD-10-CM | POA: Diagnosis not present

## 2023-03-07 DIAGNOSIS — R109 Unspecified abdominal pain: Secondary | ICD-10-CM | POA: Insufficient documentation

## 2023-03-07 DIAGNOSIS — Z833 Family history of diabetes mellitus: Secondary | ICD-10-CM | POA: Insufficient documentation

## 2023-03-07 DIAGNOSIS — E785 Hyperlipidemia, unspecified: Secondary | ICD-10-CM | POA: Diagnosis not present

## 2023-03-07 DIAGNOSIS — Z79899 Other long term (current) drug therapy: Secondary | ICD-10-CM | POA: Insufficient documentation

## 2023-03-07 DIAGNOSIS — I5022 Chronic systolic (congestive) heart failure: Secondary | ICD-10-CM | POA: Insufficient documentation

## 2023-03-07 DIAGNOSIS — I2510 Atherosclerotic heart disease of native coronary artery without angina pectoris: Secondary | ICD-10-CM | POA: Diagnosis not present

## 2023-03-07 DIAGNOSIS — Z7902 Long term (current) use of antithrombotics/antiplatelets: Secondary | ICD-10-CM | POA: Insufficient documentation

## 2023-03-07 DIAGNOSIS — E119 Type 2 diabetes mellitus without complications: Secondary | ICD-10-CM | POA: Insufficient documentation

## 2023-03-07 DIAGNOSIS — Z7984 Long term (current) use of oral hypoglycemic drugs: Secondary | ICD-10-CM | POA: Insufficient documentation

## 2023-03-07 DIAGNOSIS — Z8249 Family history of ischemic heart disease and other diseases of the circulatory system: Secondary | ICD-10-CM | POA: Insufficient documentation

## 2023-03-07 DIAGNOSIS — R112 Nausea with vomiting, unspecified: Secondary | ICD-10-CM | POA: Insufficient documentation

## 2023-03-07 DIAGNOSIS — I5023 Acute on chronic systolic (congestive) heart failure: Secondary | ICD-10-CM

## 2023-03-07 LAB — COMPREHENSIVE METABOLIC PANEL
ALT: 16 U/L (ref 0–44)
AST: 20 U/L (ref 15–41)
Albumin: 3.7 g/dL (ref 3.5–5.0)
Alkaline Phosphatase: 44 U/L (ref 38–126)
Anion gap: 12 (ref 5–15)
BUN: 19 mg/dL (ref 8–23)
CO2: 25 mmol/L (ref 22–32)
Calcium: 9.8 mg/dL (ref 8.9–10.3)
Chloride: 99 mmol/L (ref 98–111)
Creatinine, Ser: 1.67 mg/dL — ABNORMAL HIGH (ref 0.61–1.24)
GFR, Estimated: 45 mL/min — ABNORMAL LOW (ref >60)
Glucose, Bld: 108 mg/dL — ABNORMAL HIGH (ref 70–99)
Potassium: 4.2 mmol/L (ref 3.5–5.1)
Sodium: 136 mmol/L (ref 135–145)
Total Bilirubin: 1.2 mg/dL (ref 0.0–1.2)
Total Protein: 7.5 g/dL (ref 6.5–8.1)

## 2023-03-07 LAB — IRON AND TIBC
Iron: 43 ug/dL — ABNORMAL LOW (ref 45–182)
Saturation Ratios: 12 % — ABNORMAL LOW (ref 17.9–39.5)
TIBC: 349 ug/dL (ref 250–450)
UIBC: 306 ug/dL

## 2023-03-07 LAB — CBC
HCT: 44.6 % (ref 39.0–52.0)
Hemoglobin: 14.4 g/dL (ref 13.0–17.0)
MCH: 29 pg (ref 26.0–34.0)
MCHC: 32.3 g/dL (ref 30.0–36.0)
MCV: 89.9 fL (ref 80.0–100.0)
Platelets: 253 10*3/uL (ref 150–400)
RBC: 4.96 MIL/uL (ref 4.22–5.81)
RDW: 13.5 % (ref 11.5–15.5)
WBC: 5.8 10*3/uL (ref 4.0–10.5)
nRBC: 0 % (ref 0.0–0.2)

## 2023-03-07 LAB — BRAIN NATRIURETIC PEPTIDE: B Natriuretic Peptide: 603.8 pg/mL — ABNORMAL HIGH (ref 0.0–100.0)

## 2023-03-07 LAB — FERRITIN: Ferritin: 82 ng/mL (ref 24–336)

## 2023-03-07 MED ORDER — ONDANSETRON 4 MG PO TBDP
4.0000 mg | ORAL_TABLET | Freq: Three times a day (TID) | ORAL | 0 refills | Status: DC | PRN
  Filled 2023-03-07: qty 20, 7d supply, fill #0

## 2023-03-07 MED ORDER — AMIODARONE HCL 200 MG PO TABS
100.0000 mg | ORAL_TABLET | Freq: Every day | ORAL | Status: DC
Start: 2023-03-07 — End: 2023-03-24

## 2023-03-07 NOTE — Progress Notes (Signed)
 ReDS Vest / Clip - 03/07/23 1100       ReDS Vest / Clip   Station Marker C    Ruler Value 30.5    ReDS Value Range Moderate volume overload    ReDS Actual Value 36

## 2023-03-07 NOTE — Progress Notes (Deleted)
 ReDS Vest / Clip - 03/07/23 1100       ReDS Vest / Clip   Station Marker C    Ruler Value 30.5    ReDS Value Range Moderate volume overload

## 2023-03-07 NOTE — Patient Instructions (Signed)
 Medication Changes:  DECREASE Amiodarone to 100 mg (1/2 tab) Daily  We have given you a courtesy refill of Zofran   Lab Work:  Labs done today, your results will be available in MyChart, we will contact you for abnormal readings.  Special Instructions // Education:  Do the following things EVERYDAY: Weigh yourself in the morning before breakfast. Write it down and keep it in a log. Take your medicines as prescribed Eat low salt foods--Limit salt (sodium) to 2000 mg per day.  Stay as active as you can everyday Limit all fluids for the day to less than 2 liters   Follow-Up in: 03/30/23 as scheduled for echocardiogram and Dr Shirlee Latch   At the [facility], you and your health needs are our priority. We have a designated team specialized in the treatment of Heart Failure. This Care Team includes your primary Heart Failure Specialized Cardiologist (physician), Advanced Practice Providers (APPs- Physician Assistants and Nurse Practitioners), and Pharmacist who all work together to provide you with the care you need, when you need it.   You may see any of the following providers on your designated Care Team at your next follow up:  Dr. Arvilla Meres Dr. Marca Ancona Dr. Dorthula Nettles Dr. Theresia Bough Tonye Becket, NP Robbie Lis, [facility] Hazel, [place] Brynda Peon, NP [place] Lee, NP Karle Plumber, PharmD   Please be sure to bring in all your medications bottles to every appointment.   Need to Contact us:  If you have any questions or concerns before your next appointment please send [place] a message through [place] or call our office at [phone].    TO LEAVE A MESSAGE FOR THE NURSE SELECT OPTION 2, PLEASE LEAVE A MESSAGE INCLUDING: YOUR NAME DATE OF BIRTH CALL BACK NUMBER REASON FOR CALL**this is important as we prioritize the call backs  YOU WILL RECEIVE A CALL BACK THE SAME DAY AS LONG AS YOU CALL BEFORE 4:00 PM

## 2023-03-07 NOTE — Progress Notes (Signed)
 [facility] Note  PCP: Pcp, No HF Cardiology: Dr. Shirlee Latch  68 y.o. with history of CAD, DM II, HTN, HLD, ischemic cardiomyopathy/chronic systolic CHF.   Patient had inferior STEMI 10/23 s/p PCI/DES to RCA. Had residual diffuse disease in diagonals and OMs. EF 40-45% at time of cath. Subsequent echo in 10/23 with EF 55-60%.   He was not taking medications in 2024 after losing insurance. Presented with anterior STEMI 12/10/22. Cardiac cath with 100% proximal LAD treated with PCI/DES. Prior RCA stent patent, severe disease in tortuous ramus and distal LCx treated medically.  Echo with EF 25%, no LV thrombus, AK mid septum to apex, moderately reduced RV systolic function. Course complicated by atrial fibrillation with RVR. Started on IV amiodarone and underwent DCCV to SR.  GDMT limited by soft blood pressure. Also seen by GI for abdominal pain. Had thickening of stomach on CT scan. H. Pylori later resulted positive after discharge, seen by GI and started on treatment for H pylori-related gastritis.   Returned to ED 12/19/22 with recurrent atrial fibrillation. He converted to SR while in waiting room and was discharged home.  Admitted 2/25 with a/c HF. Echo showed EF 25%, RV function mildly reduced. Diuresed with IV lasix. GDMT cut back 2/2 to low BP. He was discharged home, weight 154 lbs.  He returns today for acute visit with complaints of low home BPs. BP 85/58 at home, we had previously stopped his Toprol. He remains dizzy, has to hold onto walls when we walks. No falls.  No increased SOB but feels weak. Not eating much, has mild constipation and vomiting several times a week. Has epigastric pain. Weight at home 148 at home, down 30 lbs since his MI.  Has noticed LEE, L>R and occasional palpitations. No abnormal bleeding, CP, or PND. Does have orthopnea, sleeps on 2 pillows.   ECG (personally reviewed):  NSR 71 bpm, Qtc 389  ReDs reading: 36%, normal  Labs (12/24): hgb  15.1, BNP 350, K 4.4, creatinine 1.24 Labs (2/25): K 4.1, creatinine 1.48, BNP 481  SH: No ETOH or tobacco use.  Lives at home in [place] with his wife and children. Originally from [place].   PMH: 1. Chronic systolic CHF: Ischemic cardiomyopathy.   - Echo (12/24): EF 20-25%, LAD territory WMAs, moderate LVH, moderately decreased RV systolic function, mild MR.  - Echo (2/25): EF 25%, G3DD, RV mildly reduced, moderate MR, small pericardial effusion 2. CAD: Inferior STEMI 10/23 with DES to RCA.  - Anterior STEMI (11/24): Occluded proximal LAD treated with DES, occluded D2, 90% OM1, 95% distal LCx.  Distal LCx and OM1 treated medically.  3. Atrial fibrillation: Paroxysmal.  - DCCV in 12/24.  4. H pylori gastritis 5. Hyperlipidemia 6. Type 2 diabetes  Current Outpatient Medications  Medication Sig Dispense Refill   amiodarone (PACERONE) 200 MG tablet Take 1 tablet (200 mg total) by mouth daily. 90 tablet 3   apixaban (ELIQUIS) 5 MG TABS tablet Take 1 tablet (5 mg total) by mouth 2 (two) times daily.     Ascorbic Acid (VITAMIN C PO) Take 1 tablet by mouth daily with lunch.     clopidogrel (PLAVIX) 75 MG tablet Take 1 tablet (75 mg total) by mouth daily. 30 tablet 2   Cyanocobalamin (B-12 PO) Take 1 tablet by mouth daily with lunch.     empagliflozin (JARDIANCE) 10 MG TABS tablet Take 1 tablet (10 mg total) by mouth daily before breakfast.     ezetimibe (  ZETIA) 10 MG tablet Take 1 tablet (10 mg total) by mouth daily. 30 tablet 2   MAGNESIUM PO Take 1 tablet by mouth at bedtime.     metFORMIN (GLUCOPHAGE) 500 MG tablet Take 1 tablet (500 mg total) by mouth daily with breakfast. Courtesy fill- Additional refills will need to come from PCP 30 tablet 0   metoprolol succinate (TOPROL-XL) 25 MG 24 hr tablet Take 0.5 tablets (12.5 mg total) by mouth daily.     Multiple Vitamin (MULTIVITAMIN PO) Take 1 tablet by mouth daily with lunch.     Multiple Vitamins-Minerals (ZINC PO) Take 1 tablet by mouth  daily after lunch.     nitroGLYCERIN (NITROSTAT) 0.4 MG SL tablet Place 1 tablet (0.4 mg total) under the tongue every 5 (five) minutes x 3 doses as needed for chest pain. 25 tablet 2   pantoprazole (PROTONIX) 40 MG tablet Take 1 tablet (40 mg total) by mouth 2 (two) times daily before a meal. 180 tablet 3   rosuvastatin (CRESTOR) 40 MG tablet Take 1 tablet (40 mg total) by mouth daily. 90 tablet 3   torsemide (DEMADEX) 20 MG tablet Take 1 tablet (20 mg total) by mouth daily. 30 tablet 2   Vitamin D-Vitamin K (VITAMIN K2-VITAMIN D3 PO) Take 1 tablet by mouth daily with lunch.     No current facility-administered medications for this encounter.   Allergies  Allergen Reactions   Beef-Derived Drug Products Other (See Comments)    Religious observance   Pork-Derived Products Other (See Comments)    Religious observance   Family History  Problem Relation Age of Onset   Diabetes Mother    Chronic Renal Failure Father    Diabetes Father    Chronic Renal Failure Brother    Diabetes Brother    Diabetes Brother    Heart attack Brother    Stroke Brother    Wt Readings from Last 3 Encounters:  03/07/23 69.6 kg (153 lb 6.4 oz)  02/27/23 70.9 kg (156 lb 6.4 oz)  02/20/23 69.6 kg (153 lb 7 oz)   BP 120/80   Pulse 68   Wt 69.6 kg (153 lb 6.4 oz)   SpO2 100%   BMI 20.80 kg/m   PHYSICAL EXAM: General:  NAD. No resp difficulty, walked into clinic, thin HEENT: Normal Neck: Supple. No JVD. Cor: Regular rate & rhythm. No rubs, gallops or murmurs. Lungs: Clear Abdomen: Soft, nontender, nondistended.  Extremities: No cyanosis, clubbing, rash, edema Neuro: Alert & oriented x 3, moves all 4 extremities w/o difficulty. Affect pleasant.  ASSESSMENT & PLAN: 1. Chronic systolic CHF/ischemic cardiomyopathy:  Echo 12/24 with EF 20-25%, no LV thrombus, AK mid septum to apex, moderately reduced RV systolic function. Echo 2/25 with EF 25%, mildly reduced RV function. Worsening NYHA class II-III  symptoms, suspect orthostasis vs GI issues contributing. He does not look volume overloaded on exam, ReDs OK at 36% (of note, he had a small pericardial effusion on recent echo so ReDs may be elevated in this setting). GDMT limited by what sounds like orthostasis and BP. - Continue torsemide 20 mg daily. BMET and BNP today. - Continue Jardiance 10 mg daily. - Unable to tolerate spiro, beta blocker, and Entresto with hypotension. - Repeat echo in 3/25 to assess for ICD need.  Narrow QRS so not CRT candidate.  2. CAD:  Hx inferior STEMI 10/23 s/p PCI/DES to RCA. Anterior STEMI 12/10/22, cath showed occluded pLAD treated with PCI/DES, prior RCA stent patent, severe disease OM1  and distal LCx treated medically. No ischemic sounding pain.  - Continue Plavix and Eliquis. Check CBC today. - Continue rosuvastatin 40 mg + Zetia 10 mg daily. 3. Atrial fibrillation: Paroxysmal. NSR today.  - Continue Eliquis 5 mg BID, CBC today.  - With abdominal pain and nausea, decrease amiodarone to 100 mg daily. TSH/free T3/T4 okay recently. Check LFTs today. He will need a regular eye exam.  - Favor atrial fibrillation ablation, this may allow him to stop amiodarone safely. He has seen EP.  4. DM II: Per PCP.  He is on [place].  5. N/V/ABD Pain: Unclear etiology. ? amiodarone side effects vs low-output HF vs GI etiology (hx of H Pylori).  - Continue pantoprazole 40 mg bid. - Will give Rx for PRN Zofran. QTc OK on today's ECG. - Check LFTs, electrolytes, lipase, CBC and iron studies.  - Has new PCP appt 03/21/23. Will defer further work up to them. May need GI referral.  He has follow up with Dr. Rosemary Holms 03/21/23 (if symptoms resolving and BP improves, favor restarting MRA and/or ARB/ARNi).  Keep follow up with Dr. Shirlee Latch 03/30/23  Prince Rome, FNP-BC 03/07/23

## 2023-03-08 ENCOUNTER — Other Ambulatory Visit (HOSPITAL_COMMUNITY): Payer: Self-pay

## 2023-03-09 ENCOUNTER — Other Ambulatory Visit (HOSPITAL_COMMUNITY): Payer: Self-pay

## 2023-03-09 ENCOUNTER — Telehealth (HOSPITAL_COMMUNITY): Payer: Self-pay | Admitting: *Deleted

## 2023-03-09 ENCOUNTER — Other Ambulatory Visit (HOSPITAL_COMMUNITY): Payer: Self-pay | Admitting: *Deleted

## 2023-03-09 DIAGNOSIS — I5022 Chronic systolic (congestive) heart failure: Secondary | ICD-10-CM

## 2023-03-09 DIAGNOSIS — D649 Anemia, unspecified: Secondary | ICD-10-CM

## 2023-03-09 MED ORDER — POTASSIUM CHLORIDE CRYS ER 20 MEQ PO TBCR
20.0000 meq | EXTENDED_RELEASE_TABLET | Freq: Every day | ORAL | 3 refills | Status: DC
Start: 2023-03-09 — End: 2023-03-24
  Filled 2023-03-09: qty 90, 90d supply, fill #0

## 2023-03-09 MED ORDER — TORSEMIDE 20 MG PO TABS
40.0000 mg | ORAL_TABLET | Freq: Every day | ORAL | 3 refills | Status: DC
  Filled 2023-03-09 (×2): qty 180, 90d supply, fill #0

## 2023-03-09 NOTE — Telephone Encounter (Signed)
 Called patient per Prince Rome, NP with following:  "Tsat and iron are low, iron deficiency may be contributing to his symptoms. Please arrange iron infusion if he is agreeable.   BNP creeping back up. Increase torsemide to 40 mg daily, start 20 KCL daily.   BMET in 10-14 days"  Pt verbalized understanding of same. Iron infusion ordered, will obtain prior authorization and call patient to schedule. Will forward this message to [place]. Medication list updated, prescriptions sent to requested pharmacy. Pt has f/u scheduled with General Cardiology next month, will add lab order to that visit. If patient has any questions, he will call us at [phone].

## 2023-03-10 ENCOUNTER — Other Ambulatory Visit (HOSPITAL_COMMUNITY): Payer: Self-pay

## 2023-03-10 ENCOUNTER — Other Ambulatory Visit: Payer: Self-pay

## 2023-03-13 ENCOUNTER — Other Ambulatory Visit (HOSPITAL_COMMUNITY): Payer: Self-pay

## 2023-03-14 ENCOUNTER — Emergency Department (HOSPITAL_COMMUNITY): Payer: Self-pay

## 2023-03-14 ENCOUNTER — Encounter (HOSPITAL_COMMUNITY): Payer: Self-pay | Admitting: *Deleted

## 2023-03-14 ENCOUNTER — Emergency Department (HOSPITAL_COMMUNITY)
Admission: EM | Admit: 2023-03-14 | Discharge: 2023-03-15 | Disposition: A | Discharge status: Home / Self Care | Payer: Self-pay | Attending: Emergency Medicine | Admitting: Emergency Medicine

## 2023-03-14 ENCOUNTER — Other Ambulatory Visit: Payer: Self-pay

## 2023-03-14 DIAGNOSIS — Z7901 Long term (current) use of anticoagulants: Secondary | ICD-10-CM | POA: Insufficient documentation

## 2023-03-14 DIAGNOSIS — I5023 Acute on chronic systolic (congestive) heart failure: Secondary | ICD-10-CM | POA: Insufficient documentation

## 2023-03-14 DIAGNOSIS — I509 Heart failure, unspecified: Secondary | ICD-10-CM

## 2023-03-14 LAB — CBC WITH DIFFERENTIAL/PLATELET
Abs Immature Granulocytes: 0.02 10*3/uL (ref 0.00–0.07)
Basophils Absolute: 0 10*3/uL (ref 0.0–0.1)
Basophils Relative: 1 %
Eosinophils Absolute: 0.1 10*3/uL (ref 0.0–0.5)
Eosinophils Relative: 2 %
HCT: 42.5 % (ref 39.0–52.0)
Hemoglobin: 13.4 g/dL (ref 13.0–17.0)
Immature Granulocytes: 0 %
Lymphocytes Relative: 36 %
Lymphs Abs: 2.4 10*3/uL (ref 0.7–4.0)
MCH: 28.8 pg (ref 26.0–34.0)
MCHC: 31.5 g/dL (ref 30.0–36.0)
MCV: 91.2 fL (ref 80.0–100.0)
Monocytes Absolute: 0.9 10*3/uL (ref 0.1–1.0)
Monocytes Relative: 13 %
Neutro Abs: 3.2 10*3/uL (ref 1.7–7.7)
Neutrophils Relative %: 48 %
Platelets: 284 10*3/uL (ref 150–400)
RBC: 4.66 MIL/uL (ref 4.22–5.81)
RDW: 13.7 % (ref 11.5–15.5)
WBC: 6.6 10*3/uL (ref 4.0–10.5)
nRBC: 0 % (ref 0.0–0.2)

## 2023-03-14 LAB — BASIC METABOLIC PANEL
Anion gap: 12 (ref 5–15)
BUN: 15 mg/dL (ref 8–23)
CO2: 25 mmol/L (ref 22–32)
Calcium: 9.4 mg/dL (ref 8.9–10.3)
Chloride: 97 mmol/L — ABNORMAL LOW (ref 98–111)
Creatinine, Ser: 1.44 mg/dL — ABNORMAL HIGH (ref 0.61–1.24)
GFR, Estimated: 53 mL/min — ABNORMAL LOW (ref >60)
Glucose, Bld: 167 mg/dL — ABNORMAL HIGH (ref 70–99)
Potassium: 3.5 mmol/L (ref 3.5–5.1)
Sodium: 134 mmol/L — ABNORMAL LOW (ref 135–145)

## 2023-03-14 LAB — BRAIN NATRIURETIC PEPTIDE: B Natriuretic Peptide: 812 pg/mL — ABNORMAL HIGH (ref 0.0–100.0)

## 2023-03-14 NOTE — ED Triage Notes (Signed)
 Pt states that he has been having chest discomfort since yesterday with sob and coughing.  He reports that he takes medication to get fluids off and was recently hosptialized for same.  PA in to see pt

## 2023-03-14 NOTE — ED Provider Triage Note (Signed)
 Emergency Medicine Provider Triage Evaluation Note  Garrett Roberts , a 68 y.o. male  was evaluated in triage.  Pt complains of This of breath, increased swelling in the lower extremities abdomen out having exertional dyspnea history of CHF and diabetes.  Review of Systems  Positive: Shortness of breath Negative: Fever  Physical Exam  BP 122/81 (BP Location: Left Arm)   Pulse 85   Temp 97.6 F (36.4 C) (Oral)   Resp 20   SpO2 100%  Gen:   Awake, no distress   Resp:  Normal effort  MSK:   Moves extremities without difficulty  Other:    Medical Decision Making  Medically screening exam initiated at 7:45 PM.  Appropriate orders placed.  Saman Zupko was informed that the remainder of the evaluation will be completed by another provider, this initial triage assessment does not replace that evaluation, and the importance of remaining in the ED until their evaluation is complete.     Arthor Captain, PA-C 03/14/23 2141

## 2023-03-15 ENCOUNTER — Ambulatory Visit: Payer: Self-pay | Attending: Cardiology | Admitting: Cardiology

## 2023-03-15 ENCOUNTER — Encounter: Payer: Self-pay | Admitting: Cardiology

## 2023-03-15 VITALS — BP 136/80 | HR 64 | Ht 72.0 in | Wt 151.8 lb

## 2023-03-15 DIAGNOSIS — I480 Paroxysmal atrial fibrillation: Secondary | ICD-10-CM

## 2023-03-15 DIAGNOSIS — D6869 Other thrombophilia: Secondary | ICD-10-CM

## 2023-03-15 DIAGNOSIS — I255 Ischemic cardiomyopathy: Secondary | ICD-10-CM

## 2023-03-15 DIAGNOSIS — I2510 Atherosclerotic heart disease of native coronary artery without angina pectoris: Secondary | ICD-10-CM

## 2023-03-15 DIAGNOSIS — I5022 Chronic systolic (congestive) heart failure: Secondary | ICD-10-CM

## 2023-03-15 DIAGNOSIS — Z79899 Other long term (current) drug therapy: Secondary | ICD-10-CM

## 2023-03-15 LAB — TROPONIN I (HIGH SENSITIVITY)
Troponin I (High Sensitivity): 44 ng/L — ABNORMAL HIGH (ref <18)
Troponin I (High Sensitivity): 39 ng/L — ABNORMAL HIGH (ref <18)

## 2023-03-15 MED ORDER — FUROSEMIDE 10 MG/ML IJ SOLN
60.0000 mg | Freq: Once | INTRAMUSCULAR | Status: AC
  Administered 2023-03-15: 60 mg via INTRAVENOUS
  Filled 2023-03-15: qty 6

## 2023-03-15 NOTE — Patient Instructions (Signed)
 Medication Instructions:  Your physician recommends that you continue on your current medications as directed. Please refer to the Current Medication list given to you today.  *If you need a refill on your cardiac medications before your next appointment, please call your pharmacy*  Follow-Up: At [facility], you and your health needs are our priority.  As part of our continuing mission to provide you with exceptional heart care, we have created designated Provider Care Teams.  These Care Teams include your primary Cardiologist (physician) and Advanced Practice Providers (APPs -  Physician Assistants and Nurse Practitioners) who all work together to provide you with the care you need, when you need it.  Your next appointment:   6 months  Provider:   You may see Nobie Putnam, MD or one of the following Advanced Practice Providers on your designated Care Team:   Francis Dowse, [place] [address]" [place], [place] Sherie Don, NP Canary Brim, NP

## 2023-03-15 NOTE — ED Provider Notes (Signed)
 [place] [facility]ERGENCY DEPARTMENT AT [facility] Provider Note   CSN: [id number] Arrival date & time: 03/14/23  1925     History  Chief Complaint  Patient presents with   Chest Pain    Garrett Roberts is a 68 y.o. male.  This is a 68 year old male is here today for shortness of breath, leg swelling and cough that is been ongoing for the last 1 week.  Patient has noticed that he feels as though he has been retaining fluids.  He takes torsemide daily.  Has not had any pain in his chest.  No fever.   Chest Pain      Home Medications Prior to Admission medications   Medication Sig Start Date End Date Taking? Authorizing Provider  amiodarone (PACERONE) 200 MG tablet Take 0.5 tablets (100 mg total) by mouth daily. 03/07/23   [place], Anderson Malta, FNP  apixaban (ELIQUIS) 5 MG TABS tablet Take 1 tablet (5 mg total) by mouth 2 (two) times daily. 01/16/23   Gaston Islam., NP  Ascorbic Acid (VITAMIN C PO) Take 1 tablet by mouth daily with lunch.    [provider]  clopidogrel (PLAVIX) 75 MG tablet Take 1 tablet (75 mg total) by mouth daily. 12/27/22   Andrey Farmer, PA-C  Cyanocobalamin (B-12 PO) Take 1 tablet by mouth daily with lunch.    [provider]  empagliflozin (JARDIANCE) 10 MG TABS tablet Take 1 tablet (10 mg total) by mouth daily before breakfast. 01/16/23   Gaston Islam., NP  ezetimibe (ZETIA) 10 MG tablet Take 1 tablet (10 mg total) by mouth daily. 12/27/22   Andrey Farmer, PA-C  MAGNESIUM PO Take 1 tablet by mouth at bedtime.    [provider]  metFORMIN (GLUCOPHAGE) 500 MG tablet Take 1 tablet (500 mg total) by mouth daily with breakfast. Courtesy fill- Additional refills will need to come from PCP 02/10/23   Laurey Morale, MD  metoprolol succinate (TOPROL-XL) 25 MG 24 hr tablet Take 0.5 tablets (12.5 mg total) by mouth daily. Patient not taking: Reported on 03/07/2023 02/20/23   Joycelyn Das, MD  Multiple  Vitamin (MULTIVITAMIN PO) Take 1 tablet by mouth daily with lunch.    [provider]  Multiple Vitamins-Minerals (ZINC PO) Take 1 tablet by mouth daily after lunch.    [provider]  nitroGLYCERIN (NITROSTAT) 0.4 MG SL tablet Place 1 tablet (0.4 mg total) under the tongue every 5 (five) minutes x 3 doses as needed for chest pain. 12/15/22   Arty Baumgartner, NP  ondansetron (ZOFRAN-ODT) 4 MG disintegrating tablet Dissolve 1 tablet (4 mg total) in mouth every 8 (eight) hours as needed for nausea or vomiting. 03/07/23   Milford, Anderson Malta, FNP  pantoprazole (PROTONIX) 40 MG tablet Take 1 tablet (40 mg total) by mouth 2 (two) times daily before a meal. 02/06/23   Laurey Morale, MD  potassium chloride SA (KLOR-CON M) 20 MEQ tablet Take 1 tablet (20 mEq total) by mouth daily. 03/09/23   Jacklynn Ganong, FNP  rosuvastatin (CRESTOR) 40 MG tablet Take 1 tablet (40 mg total) by mouth daily. 02/10/23 05/11/23  Laurey Morale, MD  torsemide (DEMADEX) 20 MG tablet Take 2 tablets (40 mg total) by mouth daily. 03/09/23   Milford, Anderson Malta, FNP  Vitamin D-Vitamin K (VITAMIN K2-VITAMIN D3 PO) Take 1 tablet by mouth daily with lunch.    [provider]      Allergies  Beef-derived drug products and Pork-derived products    Review of Systems   Review of Systems  Cardiovascular:  Positive for chest pain.    Physical Exam Updated Vital Signs BP 91/64   Pulse 66   Temp 97.7 F (36.5 C) (Oral)   Resp 17   SpO2 100%  Physical Exam Vitals and nursing note reviewed.  Cardiovascular:     Rate and Rhythm: Normal rate and regular rhythm.     Heart sounds: Normal heart sounds.  Pulmonary:     Effort: Pulmonary effort is normal. No tachypnea or accessory muscle usage.     Breath sounds: No decreased breath sounds, wheezing or rhonchi.  Musculoskeletal:     Cervical back: Normal range of motion.     Right lower leg: Edema present.     Left lower leg: Edema present.      ED Results / Procedures / Treatments   Labs (all labs ordered are listed, but only abnormal results are displayed) Labs Reviewed  BASIC METABOLIC PANEL - Abnormal; Notable for the following components:      Result Value   Sodium 134 (*)    Chloride 97 (*)    Glucose, Bld 167 (*)    Creatinine, Ser 1.44 (*)    GFR, Estimated 53 (*)    All other components within normal limits  BRAIN NATRIURETIC PEPTIDE - Abnormal; Notable for the following components:   B Natriuretic Peptide 812.0 (*)    All other components within normal limits  TROPONIN I (HIGH SENSITIVITY) - Abnormal; Notable for the following components:   Troponin I (High Sensitivity) 44 (*)    All other components within normal limits  TROPONIN I (HIGH SENSITIVITY) - Abnormal; Notable for the following components:   Troponin I (High Sensitivity) 39 (*)    All other components within normal limits  CBC WITH DIFFERENTIAL/PLATELET    EKG EKG Interpretation Date/Time:  Tuesday March 14 2023 19:37:00 EST Ventricular Rate:  86 PR Interval:  212 QRS Duration:  96 QT Interval:  336 QTC Calculation: 402 R Axis:   81  Text Interpretation: Sinus rhythm with 1st degree A-V block Septal infarct , age undetermined Abnormal ECG When compared with ECG of 07-Mar-2023 11:07, PREVIOUS ECG IS PRESENT No significant change since last tracing Confirmed by Anders Simmonds [phone]) on 03/15/2023 1:41:55 AM  Radiology DG Chest 2 View Result Date: 03/14/2023 CLINICAL DATA:  Chest pain and shortness of breath. EXAM: CHEST - 2 VIEW COMPARISON:  February 16, 2023 FINDINGS: The heart size and mediastinal contours are within normal limits. A coronary artery stent is seen. Mild to moderate severity diffuse coarse appearing interstitial lung markings are seen throughout both lungs. This is mildly decreased in severity when compared to the prior study. Mild right basilar atelectasis and/or infiltrate is seen which is decreased in severity when compared  to the prior exam. A very small area of scarring and/or atelectasis is seen within the mid to upper right lung. No pleural effusion or pneumothorax is identified. The visualized skeletal structures are unremarkable. IMPRESSION: Likely chronic interstitial changes, with mild residual right basilar atelectasis and/or infiltrate. A superimposed component of interstitial edema cannot be excluded. Electronically Signed   By: Aram Candela M.D.   On: 03/14/2023 21:41    Procedures Procedures    Medications Ordered in ED Medications  furosemide (LASIX) injection 60 mg (60 mg Intravenous Given 03/15/23 0128)    ED Course/ Medical Decision Making/ A&P  Medical Decision Making This is a 67 year old male who is here today for shortness of breath, leg swelling and cough.  Differential diagnoses include fluid overload, heart failure exacerbation, less likely pneumonia, less like a PE, less likely ACS.  Plan # on exam, patient not hypoxic, not tachypneic.  Has trace B-lines on his bedside ultrasound.  His BNP is mildly elevated compared to prior.  Will provide the patient with additional diuresis here in the emergency room, will plan to have him increase his torsemide at home.  Have added on a troponin on the patient.  My independent review the patient's EKG shows no significant changes from his prior tracing in February of this past year.  Reassessment 3:24 AM-patient's initial troponin elevated at 44, delta troponin 39.  Patient not having chest pain, believe this is in response to his mild heart failure exacerbation.  Will have the patient double his torsemide for the next 3 days and have him follow-up with his PCP.  I considered admission for this patient, however I believe he is appropriate for outpatient follow-up.  Risk Prescription drug management.           Final Clinical Impression(s) / ED Diagnoses Final diagnoses:  Acute on chronic congestive  heart failure, unspecified heart failure type [facility])    Rx / DC Orders ED Discharge Orders     None         Anders Simmonds T, DO 03/15/23 0327

## 2023-03-15 NOTE — Discharge Instructions (Addendum)
 While you are in the emergency room, you received a diuretic to make you urinate.  The reason why you are experiencing a cough, is due to increased fluid.  Over the next 3 days, beginning on Wednesday, I would like you to take your usual 40 mg of torsemide in the morning, and an extra 20 mg in the afternoon or evening.  Please call your primary care doctor or your cardiologist to set up a follow-up appointment for this week.  Return to the emergency room if you experience increased difficulty with your breathing, swelling, pain in your chest.

## 2023-03-15 NOTE — Progress Notes (Addendum)
 Electrophysiology Office Note:   Date:  03/15/2023  ID:  Garrett Roberts, DOB 26-Feb-1955, MRN [id number]  Primary Cardiologist: Elder Negus, MD Primary Heart Failure: None Electrophysiologist: Nobie Putnam, MD      History of Present Illness:   Garrett Roberts is a 68 y.o. male with h/o PMH of CAD s/p inferior STEMI 10/2021 treated with DES to RCA and recent anterior STEMI on 12/10/2022 s/p DES to LAD, chronic systolic heart failure, paroxysmal AF, DM type II, ICM, CKD stage II, HTN, HLD who is being seen today for follow up. Patient presented to ED on 12/10/22 with chest pain. He was found to have anterior STEMI. He underwent emergent LHC with PCI to prox LAD. Echo was done during that hospitalization and showed newly reduced LVEF of 25%. Course was complicated by AF with RVR. He was started on amiodarone and underwent TEE/DCCV with conversion to sinus rhythm. He was discharged on oral amiodarone and Garrett Roberts on 12/15/22.  Discussed the use of AI scribe software for clinical note transcription with the patient, who gave verbal consent to proceed.  History of Present Illness   The patient has had two episodes of palpitations since the last visit. Each episode was characterized by a feeling of inconsistent heartbeats, lasting for about 15-20 beats before stopping. The patient is currently on amiodarone and Garrett Roberts for atrial fibrillation management. Despite the maintaining normal rhythm and GDMT, the patient's heart pumping function remains low at 25%. The patient also reports a significant decrease in appetite, leading to feelings of weakness and weight loss. He went to ED yesterday for these complaints as well as shortness of breath. He received IV diuresis, torsemide was increased and he was discharged home. No other complaints today.     Review of systems complete and found to be negative unless listed in HPI.   EP Information / Studies Reviewed:    EKG is not ordered today.  EKG from 03/14/23 reviewed which showed sinus rhythm with PR and QRS 96ms.      Echo 02/17/23:   1. No LV thrombus noted. Left ventricular ejection fraction, by  estimation, is 25%. The left ventricle has severely decreased function.  The left ventricle demonstrates global hypokinesis. The left ventricular  internal cavity size was mildly dilated.  Left ventricular diastolic parameters are consistent with Grade III  diastolic dysfunction (restrictive). Elevated left atrial pressure.   2. Right ventricular systolic function is mildly reduced. The right  ventricular size is mildly enlarged. There is moderately elevated  pulmonary artery systolic pressure. The estimated right ventricular  systolic pressure is 59.9 mmHg.   3. Left atrial size was moderately dilated.   4. Right atrial size was mildly dilated.   5. The mitral valve is abnormal. Moderate functional mitral valve  regurgitation. No evidence of mitral stenosis.   6. The aortic valve is tricuspid. Aortic valve regurgitation is not  visualized. No aortic stenosis is present.   7. Pleural effusion noted. A small pericardial effusion is present. The  pericardial effusion is localized near the right ventricle.   8. The inferior vena cava is normal in size with greater than 50%  respiratory variability, suggesting right atrial pressure of 3 mmHg.   Zio patch monitor 14 days 01/25/2023 - 02/08/2023: Dominant rhythm: Sinus. HR 59-91 bpm. Avg HR 69 bpm, in sinus rhythm. 2 episodes of SVT/atrial tachycardia, fastest and longest at 118 bpm for 15 beats. <1% isolated SVE, couplet/triplets. 0 episodes of VT. <1% isolated VE, couplets. No  atrial fibrillation/atrial flutter/VT/high grade AV block, sinus pause >3sec noted. 3 patient triggered events, correlated with SVE/VE.    Risk Assessment/Calculations:    CHA2DS2-VASc Score = 5   This indicates a 7.2% annual risk of stroke. The patient's score is based upon: CHF History: 1 HTN  History: 1 Diabetes History: 1 Stroke History: 0 Vascular Disease History: 1 Age Score: 1 Gender Score: 0             Physical Exam:   VS:  BP 136/80 (BP Location: Left Arm, Patient Position: Sitting, Cuff Size: Normal)   Pulse 64   Ht 6' (1.829 m)   Wt 151 lb 12.8 oz (68.9 kg)   SpO2 96%   BMI 20.59 kg/m    Wt Readings from Last 3 Encounters:  03/15/23 151 lb 12.8 oz (68.9 kg)  03/07/23 153 lb 6.4 oz (69.6 kg)  02/27/23 156 lb 6.4 oz (70.9 kg)     GEN: Well developed, in no acute distress NECK: No JVD CARDIAC: Normal rate, regular rhythm.  RESPIRATORY:  Clear to auscultation without rales, wheezing or rhonchi  ABDOMEN: Soft, non-distended EXTREMITIES:  No edema; No deformity   ASSESSMENT AND PLAN:   Garrett Roberts is a 68 y.o. male with h/o PMH of CAD s/p inferior STEMI 10/2021 treated with DES to RCA and recent anterior STEMI on 12/10/2022 s/p DES to LAD, chronic systolic heart failure, paroxysmal AF, DM type II, ICM, CKD stage II, HTN, HLD who is being seen today for follow up.   #Chronic systolic heart failure: EF 25%. NYHA II-III symptoms. Well compensated on exam today. His symptoms of loss of appetite, weight loss, shortness of breath are concerning for a chronically low output state.  #Ischemic cardiomyopathy: #CAD s/p RCA STEMI in 2023 and LAD STEMI in 2024: - EF remains <35% despite optimal medical therapy and >90 days post revasc. He meets criteria for primary prevention ICD. Explained risks, benefits, and alternatives to ICD implantation, including but not limited to bleeding, infection, damage to heart or lungs, heart attack, stroke, or death.  Pt verbalized understanding and would like to speak with family before making final decision. - Continue GDMT and follow up with Dr. Shirlee Latch.   #Symptomatic paroxysmal atrial fibrillation: Given his degree of LV dysfunction and hypotension with most recent episode, I think rhythm control strategy would be best at this  time. He appears to be maintaining sinus with amiodarone.  #High risk medication use: LFTs normal on 03/07/23. TSH elevated 02/06/23 but no symptoms of hypothyroidism. Will refer to Endocrinology for assistance. - Continue amiodarone for now. Currently would be a high risk candidate for catheter ablation. Tikosyn would be an option to get off amiodarone in the long-term, but may provide less protection for ventricular arrhthymias in the setting of large ischemic scar burden. - Continue Garrett Roberts.   #Secondary hypercoagulable state due to atrial fibrillation:  -CHADSVASC score of 5. -Continue Garrett Roberts.  Will communicate plan for ICD and continuing amiodarone in short term with his HF team, Dr. Shirlee Latch.   Signed, Nobie Putnam, MD

## 2023-03-16 ENCOUNTER — Telehealth (HOSPITAL_COMMUNITY): Payer: Self-pay

## 2023-03-16 ENCOUNTER — Other Ambulatory Visit (HOSPITAL_COMMUNITY): Payer: Self-pay

## 2023-03-16 ENCOUNTER — Ambulatory Visit (HOSPITAL_COMMUNITY)
Admission: RE | Admit: 2023-03-16 | Discharge: 2023-03-16 | Disposition: A | Discharge status: Home / Self Care | Payer: MEDICAID | Source: Ambulatory Visit | Attending: Cardiology | Admitting: Cardiology

## 2023-03-16 VITALS — BP 90/58 | HR 67 | Wt 151.0 lb

## 2023-03-16 DIAGNOSIS — Z79899 Other long term (current) drug therapy: Secondary | ICD-10-CM | POA: Insufficient documentation

## 2023-03-16 DIAGNOSIS — I2510 Atherosclerotic heart disease of native coronary artery without angina pectoris: Secondary | ICD-10-CM | POA: Diagnosis not present

## 2023-03-16 DIAGNOSIS — N183 Chronic kidney disease, stage 3 unspecified: Secondary | ICD-10-CM | POA: Diagnosis not present

## 2023-03-16 DIAGNOSIS — Z7984 Long term (current) use of oral hypoglycemic drugs: Secondary | ICD-10-CM | POA: Diagnosis not present

## 2023-03-16 DIAGNOSIS — E785 Hyperlipidemia, unspecified: Secondary | ICD-10-CM | POA: Insufficient documentation

## 2023-03-16 DIAGNOSIS — I252 Old myocardial infarction: Secondary | ICD-10-CM | POA: Diagnosis not present

## 2023-03-16 DIAGNOSIS — Z955 Presence of coronary angioplasty implant and graft: Secondary | ICD-10-CM | POA: Insufficient documentation

## 2023-03-16 DIAGNOSIS — I5022 Chronic systolic (congestive) heart failure: Secondary | ICD-10-CM | POA: Insufficient documentation

## 2023-03-16 DIAGNOSIS — Z7902 Long term (current) use of antithrombotics/antiplatelets: Secondary | ICD-10-CM | POA: Diagnosis not present

## 2023-03-16 DIAGNOSIS — I480 Paroxysmal atrial fibrillation: Secondary | ICD-10-CM | POA: Insufficient documentation

## 2023-03-16 DIAGNOSIS — I130 Hypertensive heart and chronic kidney disease with heart failure and stage 1 through stage 4 chronic kidney disease, or unspecified chronic kidney disease: Secondary | ICD-10-CM | POA: Insufficient documentation

## 2023-03-16 DIAGNOSIS — Z6820 Body mass index (BMI) 20.0-20.9, adult: Secondary | ICD-10-CM | POA: Insufficient documentation

## 2023-03-16 DIAGNOSIS — I255 Ischemic cardiomyopathy: Secondary | ICD-10-CM | POA: Diagnosis not present

## 2023-03-16 DIAGNOSIS — R630 Anorexia: Secondary | ICD-10-CM | POA: Insufficient documentation

## 2023-03-16 DIAGNOSIS — Z7901 Long term (current) use of anticoagulants: Secondary | ICD-10-CM | POA: Insufficient documentation

## 2023-03-16 DIAGNOSIS — E1122 Type 2 diabetes mellitus with diabetic chronic kidney disease: Secondary | ICD-10-CM | POA: Diagnosis not present

## 2023-03-16 MED ORDER — SPIRONOLACTONE 25 MG PO TABS
12.5000 mg | ORAL_TABLET | Freq: Every evening | ORAL | 3 refills | Status: DC
  Filled 2023-03-16: qty 15, 30d supply, fill #0

## 2023-03-16 MED ORDER — DIGOXIN 125 MCG PO TABS
0.1250 mg | ORAL_TABLET | Freq: Every day | ORAL | 3 refills | Status: DC
  Filled 2023-03-16: qty 30, 30d supply, fill #0

## 2023-03-16 NOTE — Telephone Encounter (Signed)
 Called patient to follow up on his ED visit. Reports Sob has subsided as has the edema from his legs. Reports that he has no appetite and eats very little. Spoke with providers and have scheduled appointment for today at 11.20 am

## 2023-03-16 NOTE — Progress Notes (Signed)
 Advanced Heart Failure Clinic Note  PCP: Pcp, No HF Cardiology: Dr. Shirlee Latch  Chief complaint: CHF  68 y.o. with history of CAD, DM II, HTN, HLD, ischemic cardiomyopathy/chronic systolic CHF.   Patient had inferior STEMI 10/23 s/p PCI/DES to RCA. Had residual diffuse disease in diagonals and OMs. EF 40-45% at time of cath. Subsequent echo in 10/23 with EF 55-60%.   He was not taking medications in 2024 after losing insurance. Presented with anterior STEMI 12/10/22. Cardiac cath with 100% proximal LAD treated with PCI/DES. Prior RCA stent patent, severe disease in tortuous ramus and distal LCx treated medically.  Echo with EF 25%, no LV thrombus, AK mid septum to apex, moderately reduced RV systolic function. Course complicated by atrial fibrillation with RVR. Started on IV amiodarone and underwent DCCV to SR.  GDMT limited by soft blood pressure. Also seen by GI for abdominal pain. Had thickening of stomach on CT scan. H. Pylori later resulted positive after discharge, seen by GI and started on treatment for H pylori-related gastritis.   Returned to ED 12/19/22 with recurrent atrial fibrillation. He converted to SR while in waiting room and was discharged home.  Admitted 2/25 with acute CHF. Diuresed with IV lasix. GDMT cut back 2/2 to low BP, Entresto and spironolactone stopped. He was discharged home, weight 154 lbs.  Echo in 2/25 showed EF 25% with mild RV dysfunction and mild RV enlargement, PASP 60 mmHg, moderate MR.   He was seen again in the ER 03/14/23 with worsening fatigue and dyspnea.  Torsemide was increased to 40 qam/20 qpm.    Breathing has improved on higher torsemide dose.  However, he still has profound fatigue and poor appetite.  He was very tired walking in the office today but not short of breath. No palpitations or chest pain.  He has 2 pillow orthopnea chronically.  Occasional lightheadedness if he stands up too fast.  GDMT has been limited by low BP.  Weight down 2 lbs.    ECG (personally reviewed):  NSR, nonspecific T wave changes.   REDS clip: 30%  Labs (12/24): hgb 15.1, BNP 350, K 4.4, creatinine 1.24 Labs (2/25): K 4.1, creatinine 1.48, BNP 481, LFTs normal, free T3/T4 normal Labs (3/25): K 3.5, creatinine 1.44, hgb 13.4, BNP 812, HS-TnI 44 => 39  SH: No ETOH or tobacco use.  Lives at home in [place] with his wife and children. Originally from [place].   PMH: 1. Chronic systolic CHF: Ischemic cardiomyopathy.   - Echo (12/24): EF 20-25%, LAD territory WMAs, moderate LVH, moderately decreased RV systolic function, mild MR.  - Echo (2/25): EF 25% with mild RV dysfunction and mild RV enlargement, PASP 60 mmHg, moderate MR.  2. CAD: Inferior STEMI 10/23 with DES to RCA.  - Anterior STEMI (11/24): Occluded proximal LAD treated with DES, occluded D2, 90% OM1, 95% distal LCx.  Distal LCx and OM1 treated medically.  3. Atrial fibrillation: Paroxysmal.  - DCCV in 12/24.  4. H pylori gastritis 5. Hyperlipidemia 6. Type 2 diabetes  Current Outpatient Medications  Medication Sig Dispense Refill   amiodarone (PACERONE) 200 MG tablet Take 0.5 tablets (100 mg total) by mouth daily.     apixaban (ELIQUIS) 5 MG TABS tablet Take 1 tablet (5 mg total) by mouth 2 (two) times daily.     Ascorbic Acid (VITAMIN C PO) Take 1 tablet by mouth daily with lunch.     clopidogrel (PLAVIX) 75 MG tablet Take 1 tablet (75 mg total) by  mouth daily. 30 tablet 2   Cyanocobalamin (B-12 PO) Take 1 tablet by mouth daily with lunch.     digoxin (LANOXIN) 0.125 MG tablet Take 1 tablet (0.125 mg total) by mouth daily. 90 tablet 3   empagliflozin (JARDIANCE) 10 MG TABS tablet Take 1 tablet (10 mg total) by mouth daily before breakfast.     ezetimibe (ZETIA) 10 MG tablet Take 1 tablet (10 mg total) by mouth daily. 30 tablet 2   MAGNESIUM PO Take 1 tablet by mouth at bedtime.     metFORMIN (GLUCOPHAGE) 500 MG tablet Take 1 tablet (500 mg total) by mouth daily with breakfast. Courtesy  fill- Additional refills will need to come from PCP 30 tablet 0   Multiple Vitamins-Minerals (ZINC PO) Take 1 tablet by mouth daily after lunch.     nitroGLYCERIN (NITROSTAT) 0.4 MG SL tablet Place 1 tablet (0.4 mg total) under the tongue every 5 (five) minutes x 3 doses as needed for chest pain. 25 tablet 2   ondansetron (ZOFRAN-ODT) 4 MG disintegrating tablet Dissolve 1 tablet (4 mg total) in mouth every 8 (eight) hours as needed for nausea or vomiting. 20 tablet 0   pantoprazole (PROTONIX) 40 MG tablet Take 1 tablet (40 mg total) by mouth 2 (two) times daily before a meal. 180 tablet 3   potassium chloride SA (KLOR-CON M) 20 MEQ tablet Take 1 tablet (20 mEq total) by mouth daily. 90 tablet 3   rosuvastatin (CRESTOR) 40 MG tablet Take 1 tablet (40 mg total) by mouth daily. 90 tablet 3   spironolactone (ALDACTONE) 25 MG tablet Take 0.5 tablets (12.5 mg total) by mouth at bedtime. 45 tablet 3   torsemide (DEMADEX) 20 MG tablet Take 40 mg by mouth in the morning. 1 tab in the evening     Vitamin D-Vitamin K (VITAMIN K2-VITAMIN D3 PO) Take 1 tablet by mouth daily with lunch.     No current facility-administered medications for this encounter.   Allergies  Allergen Reactions   Beef-Derived Drug Products Other (See Comments)    Religious observance   Pork-Derived Products Other (See Comments)    Religious observance   Family History  Problem Relation Age of Onset   Diabetes Mother    Chronic Renal Failure Father    Diabetes Father    Chronic Renal Failure Brother    Diabetes Brother    Diabetes Brother    Heart attack Brother    Stroke Brother    Wt Readings from Last 3 Encounters:  03/16/23 68.5 kg (151 lb)  03/15/23 68.9 kg (151 lb 12.8 oz)  03/07/23 69.6 kg (153 lb 6.4 oz)   BP (!) 90/58   Pulse 67   Wt 68.5 kg (151 lb)   SpO2 98%   BMI 20.48 kg/m   PHYSICAL EXAM: General: NAD Neck: No JVD, no thyromegaly or thyroid nodule.  Lungs: Clear to auscultation bilaterally with  normal respiratory effort. CV: Nondisplaced PMI.  Heart regular S1/S2, no S3/S4, no murmur.  No peripheral edema.  No carotid bruit.  Normal pedal pulses.  Abdomen: Soft, nontender, no hepatosplenomegaly, no distention.  Skin: Intact without lesions or rashes.  Neurologic: Alert and oriented x 3.  Psych: Normal affect. Extremities: No clubbing or cyanosis.  HEENT: Normal. .  ASSESSMENT & PLAN: 1. Chronic systolic CHF/ischemic cardiomyopathy:  Echo 12/24 with EF 20-25%, no LV thrombus, AK mid septum to apex, moderately reduced RV systolic function. Echo 2/25 with EF 25%, mildly reduced RV function. NYHA class  IIIb-IV, primarily due to profound fatigue.  Low BP limited GDMT titration.  He is not volume overloaded by exam or REDS clip today after increasing torsemide, but his profound fatigue is concerning for low output HF.  - I am going to set him up for RHC Monday.  If his cardiac output is low, I will admit him to start milrinone and begin workup for LVAD.  We discussed the possible need for advanced therapies today. Hold Eliquis day of and day before cath.  Discussed risks/benefits with patient and he agreed to procedure.  - Continue torsemide 40 qam/20 qpm. BMET yesterday stable.  - Continue Jardiance 10 mg daily. - Start digoxin 0.0625 daily (use low dose with relatively small size and CKD stage 3).  - Stop Toprol XL with concern for low output.  - Start spironolactone 12.5 at bedtime. BMET next week.  - Will defer discussion of ICD for now given concern for need for advanced therapies.  He qualifies with persistent low EF.  Narrow QRS so not CRT candidate.   2. CAD:  Hx inferior STEMI 10/23 s/p PCI/DES to RCA. Anterior STEMI 12/10/22, cath showed occluded pLAD treated with PCI/DES, prior RCA stent patent, severe disease OM1 and distal LCx treated medically. No chest pain.  - Continue Plavix and Eliquis.  - Continue rosuvastatin 40 mg + Zetia 10 mg daily. 3. Atrial fibrillation: Paroxysmal.  NSR today.  - Continue Eliquis 5 mg BID - Continue amiodarone to 100 mg daily. LFTs, free T4/T3 normal recently.   4. DM II: Per PCP.  He is on [place].   Plan for RHC Monday, will decide on followup after that.   I spent 41 minutes reviewing records, interviewing/examining patient, and managing orders.    Marca Ancona 03/16/23

## 2023-03-16 NOTE — Patient Instructions (Signed)
 START Spironolactone 12.5 mg ( 1/2 Tab) nightly.  START Digoxin 125 mcg today ( 1 Tab) and then take .625 mcg ( 1/2 tab) daily after that.  STOP Toprol XL  You are scheduled for a Cardiac Catheterization on Monday, March 10 with Dr. Marca Ancona.  1. Please arrive at the [facility] (Main Entrance A) at [facility]: [address] [place], [place] [address] at 8:00 AM (This time is 2 hour(s) before your procedure to ensure your preparation).   Free valet parking service is available. You will check in at ADMITTING. The support person will be asked to wait in the waiting room.  It is OK to have someone drop you off and come back when you are ready to be discharged.    Special note: Every effort is made to have your procedure done on time. Please understand that emergencies sometimes delay scheduled procedures.  2. Diet: Do not eat solid foods after midnight.  The patient may have clear liquids until 5am upon the day of the procedure.  3.  Medication instructions in preparation for your procedure:   Contrast Allergy: No  HOLD JARDIANCE, TORSEMIDE, SPIRONOLACTONE AND METFORMIN THE MORNING OF YOUR PROCEDURE.  On the morning of your procedure, take your Plavix/Clopidogrel and any morning medicines NOT listed above.  You may use sips of water.  5. Plan to go home the same day, you will only stay overnight if medically necessary. 6. Bring a current list of your medications and current insurance cards. 7. You MUST have a responsible person to drive you home. 8. Someone MUST be with you the first 24 hours after you arrive home or your discharge will be delayed. 9. Please wear clothes that are easy to get on and off and wear slip-on shoes.  If you have any questions or concerns before your next appointment please send [place] a message through [place] or call our office at [phone].    TO LEAVE A MESSAGE FOR THE NURSE SELECT OPTION 2, PLEASE LEAVE A MESSAGE INCLUDING: YOUR NAME DATE  OF BIRTH CALL BACK NUMBER REASON FOR CALL**this is important as we prioritize the call backs  YOU WILL RECEIVE A CALL BACK THE SAME DAY AS LONG AS YOU CALL BEFORE 4:00 PM  At the [facility], you and your health needs are our priority. As part of our continuing mission to provide you with exceptional heart care, we have created designated Provider Care Teams. These Care Teams include your primary Cardiologist (physician) and Advanced Practice Providers (APPs- Physician Assistants and Nurse Practitioners) who all work together to provide you with the care you need, when you need it.   You may see any of the following providers on your designated Care Team at your next follow up: Dr Arvilla Meres Dr Marca Ancona Dr. Dorthula Nettles Dr. Clearnce Hasten Amy Filbert Schilder, NP Robbie Lis, [place] [facility] [place], [place] Brynda Peon, NP [place] Lee, NP Clarisa Kindred, NP Karle Plumber, PharmD Enos Fling, PharmD   Please be sure to bring in all your medications bottles to every appointment.    Thank you for choosing [facility]

## 2023-03-16 NOTE — Progress Notes (Signed)
 ReDS Vest / Clip - 03/16/23 1200       ReDS Vest / Clip   Station Marker C    Ruler Value 28    ReDS Value Range Low volume    ReDS Actual Value 30

## 2023-03-16 NOTE — H&P (View-Only) (Signed)
 [facility] Note  PCP: Pcp, No HF Cardiology: Dr. Shirlee Latch  Chief complaint: CHF  68 y.o. with history of CAD, DM II, HTN, HLD, ischemic cardiomyopathy/chronic systolic CHF.   Patient had inferior STEMI 10/23 s/p PCI/DES to RCA. Had residual diffuse disease in diagonals and OMs. EF 40-45% at time of cath. Subsequent echo in 10/23 with EF 55-60%.   He was not taking medications in 2024 after losing insurance. Presented with anterior STEMI 12/10/22. Cardiac cath with 100% proximal LAD treated with PCI/DES. Prior RCA stent patent, severe disease in tortuous ramus and distal LCx treated medically.  Echo with EF 25%, no LV thrombus, AK mid septum to apex, moderately reduced RV systolic function. Course complicated by atrial fibrillation with RVR. Started on IV amiodarone and underwent DCCV to SR.  GDMT limited by soft blood pressure. Also seen by GI for abdominal pain. Had thickening of stomach on CT scan. H. Pylori later resulted positive after discharge, seen by GI and started on treatment for H pylori-related gastritis.   Returned to ED 12/19/22 with recurrent atrial fibrillation. He converted to SR while in waiting room and was discharged home.  Admitted 2/25 with acute CHF. Diuresed with IV lasix. GDMT cut back 2/2 to low BP, Entresto and spironolactone stopped. He was discharged home, weight 154 lbs.  Echo in 2/25 showed EF 25% with mild RV dysfunction and mild RV enlargement, PASP 60 mmHg, moderate MR.   He was seen again in the ER 03/14/23 with worsening fatigue and dyspnea.  Torsemide was increased to 40 qam/20 qpm.    Breathing has improved on higher torsemide dose.  However, he still has profound fatigue and poor appetite.  He was very tired walking in the office today but not short of breath. No palpitations or chest pain.  He has 2 pillow orthopnea chronically.  Occasional lightheadedness if he stands up too fast.  GDMT has been limited by low BP.  Weight down 2 lbs.    ECG (personally reviewed):  NSR, nonspecific T wave changes.   REDS clip: 30%  Labs (12/24): hgb 15.1, BNP 350, K 4.4, creatinine 1.24 Labs (2/25): K 4.1, creatinine 1.48, BNP 481, LFTs normal, free T3/T4 normal Labs (3/25): K 3.5, creatinine 1.44, hgb 13.4, BNP 812, HS-TnI 44 => 39  SH: No ETOH or tobacco use.  Lives at home in [place] with his wife and children. Originally from [place].   PMH: 1. Chronic systolic CHF: Ischemic cardiomyopathy.   - Echo (12/24): EF 20-25%, LAD territory WMAs, moderate LVH, moderately decreased RV systolic function, mild MR.  - Echo (2/25): EF 25% with mild RV dysfunction and mild RV enlargement, PASP 60 mmHg, moderate MR.  2. CAD: Inferior STEMI 10/23 with DES to RCA.  - Anterior STEMI (11/24): Occluded proximal LAD treated with DES, occluded D2, 90% OM1, 95% distal LCx.  Distal LCx and OM1 treated medically.  3. Atrial fibrillation: Paroxysmal.  - DCCV in 12/24.  4. H pylori gastritis 5. Hyperlipidemia 6. Type 2 diabetes  Current Outpatient Medications  Medication Sig Dispense Refill   amiodarone (PACERONE) 200 MG tablet Take 0.5 tablets (100 mg total) by mouth daily.     apixaban (ELIQUIS) 5 MG TABS tablet Take 1 tablet (5 mg total) by mouth 2 (two) times daily.     Ascorbic Acid (VITAMIN C PO) Take 1 tablet by mouth daily with lunch.     clopidogrel (PLAVIX) 75 MG tablet Take 1 tablet (75 mg total) by  mouth daily. 30 tablet 2   Cyanocobalamin (B-12 PO) Take 1 tablet by mouth daily with lunch.     digoxin (LANOXIN) 0.125 MG tablet Take 1 tablet (0.125 mg total) by mouth daily. 90 tablet 3   empagliflozin (JARDIANCE) 10 MG TABS tablet Take 1 tablet (10 mg total) by mouth daily before breakfast.     ezetimibe (ZETIA) 10 MG tablet Take 1 tablet (10 mg total) by mouth daily. 30 tablet 2   MAGNESIUM PO Take 1 tablet by mouth at bedtime.     metFORMIN (GLUCOPHAGE) 500 MG tablet Take 1 tablet (500 mg total) by mouth daily with breakfast. Courtesy  fill- Additional refills will need to come from PCP 30 tablet 0   Multiple Vitamins-Minerals (ZINC PO) Take 1 tablet by mouth daily after lunch.     nitroGLYCERIN (NITROSTAT) 0.4 MG SL tablet Place 1 tablet (0.4 mg total) under the tongue every 5 (five) minutes x 3 doses as needed for chest pain. 25 tablet 2   ondansetron (ZOFRAN-ODT) 4 MG disintegrating tablet Dissolve 1 tablet (4 mg total) in mouth every 8 (eight) hours as needed for nausea or vomiting. 20 tablet 0   pantoprazole (PROTONIX) 40 MG tablet Take 1 tablet (40 mg total) by mouth 2 (two) times daily before a meal. 180 tablet 3   potassium chloride SA (KLOR-CON M) 20 MEQ tablet Take 1 tablet (20 mEq total) by mouth daily. 90 tablet 3   rosuvastatin (CRESTOR) 40 MG tablet Take 1 tablet (40 mg total) by mouth daily. 90 tablet 3   spironolactone (ALDACTONE) 25 MG tablet Take 0.5 tablets (12.5 mg total) by mouth at bedtime. 45 tablet 3   torsemide (DEMADEX) 20 MG tablet Take 40 mg by mouth in the morning. 1 tab in the evening     Vitamin D-Vitamin K (VITAMIN K2-VITAMIN D3 PO) Take 1 tablet by mouth daily with lunch.     No current facility-administered medications for this encounter.   Allergies  Allergen Reactions   Beef-Derived Drug Products Other (See Comments)    Religious observance   Pork-Derived Products Other (See Comments)    Religious observance   Family History  Problem Relation Age of Onset   Diabetes Mother    Chronic Renal Failure Father    Diabetes Father    Chronic Renal Failure Brother    Diabetes Brother    Diabetes Brother    Heart attack Brother    Stroke Brother    Wt Readings from Last 3 Encounters:  03/16/23 68.5 kg (151 lb)  03/15/23 68.9 kg (151 lb 12.8 oz)  03/07/23 69.6 kg (153 lb 6.4 oz)   BP (!) 90/58   Pulse 67   Wt 68.5 kg (151 lb)   SpO2 98%   BMI 20.48 kg/m   PHYSICAL EXAM: General: NAD Neck: No JVD, no thyromegaly or thyroid nodule.  Lungs: Clear to auscultation bilaterally with  normal respiratory effort. CV: Nondisplaced PMI.  Heart regular S1/S2, no S3/S4, no murmur.  No peripheral edema.  No carotid bruit.  Normal pedal pulses.  Abdomen: Soft, nontender, no hepatosplenomegaly, no distention.  Skin: Intact without lesions or rashes.  Neurologic: Alert and oriented x 3.  Psych: Normal affect. Extremities: No clubbing or cyanosis.  HEENT: Normal. .  ASSESSMENT & PLAN: 1. Chronic systolic CHF/ischemic cardiomyopathy:  Echo 12/24 with EF 20-25%, no LV thrombus, AK mid septum to apex, moderately reduced RV systolic function. Echo 2/25 with EF 25%, mildly reduced RV function. NYHA class  IIIb-IV, primarily due to profound fatigue.  Low BP limited GDMT titration.  He is not volume overloaded by exam or REDS clip today after increasing torsemide, but his profound fatigue is concerning for low output HF.  - I am going to set him up for RHC Monday.  If his cardiac output is low, I will admit him to start milrinone and begin workup for LVAD.  We discussed the possible need for advanced therapies today. Hold Eliquis day of and day before cath.  Discussed risks/benefits with patient and he agreed to procedure.  - Continue torsemide 40 qam/20 qpm. BMET yesterday stable.  - Continue Jardiance 10 mg daily. - Start digoxin 0.0625 daily (use low dose with relatively small size and CKD stage 3).  - Stop Toprol XL with concern for low output.  - Start spironolactone 12.5 at bedtime. BMET next week.  - Will defer discussion of ICD for now given concern for need for advanced therapies.  He qualifies with persistent low EF.  Narrow QRS so not CRT candidate.   2. CAD:  Hx inferior STEMI 10/23 s/p PCI/DES to RCA. Anterior STEMI 12/10/22, cath showed occluded pLAD treated with PCI/DES, prior RCA stent patent, severe disease OM1 and distal LCx treated medically. No chest pain.  - Continue Plavix and Eliquis.  - Continue rosuvastatin 40 mg + Zetia 10 mg daily. 3. Atrial fibrillation: Paroxysmal.  NSR today.  - Continue Eliquis 5 mg BID - Continue amiodarone to 100 mg daily. LFTs, free T4/T3 normal recently.   4. DM II: Per PCP.  He is on [place].   Plan for RHC Monday, will decide on followup after that.   I spent 41 minutes reviewing records, interviewing/examining patient, and managing orders.    Marca Ancona 03/16/23

## 2023-03-17 ENCOUNTER — Telehealth (HOSPITAL_COMMUNITY): Payer: Self-pay

## 2023-03-17 ENCOUNTER — Telehealth: Payer: Self-pay

## 2023-03-17 DIAGNOSIS — R7989 Other specified abnormal findings of blood chemistry: Secondary | ICD-10-CM

## 2023-03-17 NOTE — Telephone Encounter (Signed)
 Spoke to patient and made aware to hold Eliquis the day before and morning of his procedure scheduled for Monday. Reviewed the other medications to hold am of procedure.

## 2023-03-17 NOTE — Telephone Encounter (Signed)
 Referral has been placed.

## 2023-03-17 NOTE — Telephone Encounter (Signed)
-----   Message from Nobie Putnam sent at 03/15/2023 10:28 PM EST ----- Regarding: Endocrine consult TSH elevated on amiodarone. Can we refer to Endocrinology?  Thanks, [employer]

## 2023-03-20 ENCOUNTER — Other Ambulatory Visit (HOSPITAL_COMMUNITY): Payer: Self-pay

## 2023-03-20 ENCOUNTER — Encounter (HOSPITAL_COMMUNITY)
Admission: RE | Disposition: A | Discharge status: Home / Self Care | Payer: Self-pay | Source: Home / Self Care | Attending: Cardiology

## 2023-03-20 ENCOUNTER — Inpatient Hospital Stay (HOSPITAL_COMMUNITY): Payer: Self-pay

## 2023-03-20 ENCOUNTER — Other Ambulatory Visit: Payer: Self-pay

## 2023-03-20 ENCOUNTER — Inpatient Hospital Stay (HOSPITAL_COMMUNITY)
Admission: RE | Admit: 2023-03-20 | Discharge: 2023-03-24 | DRG: 286 | Disposition: A | Discharge status: Home / Self Care | Payer: Self-pay | Attending: Cardiology | Admitting: Cardiology

## 2023-03-20 DIAGNOSIS — R627 Adult failure to thrive: Secondary | ICD-10-CM | POA: Diagnosis present

## 2023-03-20 DIAGNOSIS — N1831 Chronic kidney disease, stage 3a: Secondary | ICD-10-CM | POA: Diagnosis present

## 2023-03-20 DIAGNOSIS — I5023 Acute on chronic systolic (congestive) heart failure: Principal | ICD-10-CM | POA: Diagnosis present

## 2023-03-20 DIAGNOSIS — Z8249 Family history of ischemic heart disease and other diseases of the circulatory system: Secondary | ICD-10-CM

## 2023-03-20 DIAGNOSIS — I2510 Atherosclerotic heart disease of native coronary artery without angina pectoris: Secondary | ICD-10-CM | POA: Diagnosis present

## 2023-03-20 DIAGNOSIS — K5900 Constipation, unspecified: Secondary | ICD-10-CM | POA: Diagnosis present

## 2023-03-20 DIAGNOSIS — E1122 Type 2 diabetes mellitus with diabetic chronic kidney disease: Secondary | ICD-10-CM | POA: Diagnosis present

## 2023-03-20 DIAGNOSIS — I480 Paroxysmal atrial fibrillation: Secondary | ICD-10-CM | POA: Diagnosis present

## 2023-03-20 DIAGNOSIS — Z7902 Long term (current) use of antithrombotics/antiplatelets: Secondary | ICD-10-CM | POA: Diagnosis not present

## 2023-03-20 DIAGNOSIS — R933 Abnormal findings on diagnostic imaging of other parts of digestive tract: Secondary | ICD-10-CM | POA: Diagnosis not present

## 2023-03-20 DIAGNOSIS — Z7901 Long term (current) use of anticoagulants: Secondary | ICD-10-CM

## 2023-03-20 DIAGNOSIS — J918 Pleural effusion in other conditions classified elsewhere: Secondary | ICD-10-CM | POA: Diagnosis present

## 2023-03-20 DIAGNOSIS — Z841 Family history of disorders of kidney and ureter: Secondary | ICD-10-CM | POA: Diagnosis not present

## 2023-03-20 DIAGNOSIS — E785 Hyperlipidemia, unspecified: Secondary | ICD-10-CM | POA: Diagnosis present

## 2023-03-20 DIAGNOSIS — I130 Hypertensive heart and chronic kidney disease with heart failure and stage 1 through stage 4 chronic kidney disease, or unspecified chronic kidney disease: Secondary | ICD-10-CM | POA: Diagnosis present

## 2023-03-20 DIAGNOSIS — Z79899 Other long term (current) drug therapy: Secondary | ICD-10-CM

## 2023-03-20 DIAGNOSIS — Z800 Family history of malignant neoplasm of digestive organs: Secondary | ICD-10-CM

## 2023-03-20 DIAGNOSIS — K3189 Other diseases of stomach and duodenum: Secondary | ICD-10-CM | POA: Diagnosis present

## 2023-03-20 DIAGNOSIS — J479 Bronchiectasis, uncomplicated: Secondary | ICD-10-CM | POA: Diagnosis present

## 2023-03-20 DIAGNOSIS — Z955 Presence of coronary angioplasty implant and graft: Secondary | ICD-10-CM

## 2023-03-20 DIAGNOSIS — I2721 Secondary pulmonary arterial hypertension: Secondary | ICD-10-CM | POA: Diagnosis present

## 2023-03-20 DIAGNOSIS — Z5986 Financial insecurity: Secondary | ICD-10-CM

## 2023-03-20 DIAGNOSIS — Z7984 Long term (current) use of oral hypoglycemic drugs: Secondary | ICD-10-CM

## 2023-03-20 DIAGNOSIS — I4891 Unspecified atrial fibrillation: Secondary | ICD-10-CM | POA: Diagnosis present

## 2023-03-20 DIAGNOSIS — N179 Acute kidney failure, unspecified: Secondary | ICD-10-CM | POA: Diagnosis present

## 2023-03-20 DIAGNOSIS — Z823 Family history of stroke: Secondary | ICD-10-CM

## 2023-03-20 DIAGNOSIS — Z8619 Personal history of other infectious and parasitic diseases: Secondary | ICD-10-CM

## 2023-03-20 DIAGNOSIS — K31A0 Gastric intestinal metaplasia, unspecified: Secondary | ICD-10-CM | POA: Diagnosis present

## 2023-03-20 DIAGNOSIS — I5022 Chronic systolic (congestive) heart failure: Secondary | ICD-10-CM | POA: Diagnosis not present

## 2023-03-20 DIAGNOSIS — I252 Old myocardial infarction: Secondary | ICD-10-CM | POA: Diagnosis not present

## 2023-03-20 DIAGNOSIS — K449 Diaphragmatic hernia without obstruction or gangrene: Secondary | ICD-10-CM | POA: Diagnosis not present

## 2023-03-20 DIAGNOSIS — Z91014 Allergy to mammalian meats: Secondary | ICD-10-CM | POA: Diagnosis not present

## 2023-03-20 DIAGNOSIS — I255 Ischemic cardiomyopathy: Secondary | ICD-10-CM | POA: Diagnosis present

## 2023-03-20 DIAGNOSIS — R634 Abnormal weight loss: Secondary | ICD-10-CM | POA: Diagnosis not present

## 2023-03-20 DIAGNOSIS — E43 Unspecified severe protein-calorie malnutrition: Secondary | ICD-10-CM | POA: Insufficient documentation

## 2023-03-20 DIAGNOSIS — Z833 Family history of diabetes mellitus: Secondary | ICD-10-CM | POA: Diagnosis not present

## 2023-03-20 DIAGNOSIS — K2950 Unspecified chronic gastritis without bleeding: Secondary | ICD-10-CM | POA: Diagnosis present

## 2023-03-20 HISTORY — PX: RIGHT HEART CATH: CATH118263

## 2023-03-20 LAB — POCT I-STAT EG7
Acid-Base Excess: 6 mmol/L — ABNORMAL HIGH (ref 0.0–2.0)
Bicarbonate: 32.2 mmol/L — ABNORMAL HIGH (ref 20.0–28.0)
Calcium, Ion: 1.19 mmol/L (ref 1.15–1.40)
HCT: 41 % (ref 39.0–52.0)
Hemoglobin: 13.9 g/dL (ref 13.0–17.0)
O2 Saturation: 69 %
Potassium: 3.6 mmol/L (ref 3.5–5.1)
Sodium: 138 mmol/L (ref 135–145)
TCO2: 34 mmol/L — ABNORMAL HIGH (ref 22–32)
pCO2, Ven: 49.7 mmHg (ref 44–60)
pH, Ven: 7.42 (ref 7.25–7.43)
pO2, Ven: 36 mmHg (ref 32–45)

## 2023-03-20 LAB — GLUCOSE, CAPILLARY
Glucose-Capillary: 154 mg/dL — ABNORMAL HIGH (ref 70–99)
Glucose-Capillary: 171 mg/dL — ABNORMAL HIGH (ref 70–99)
Glucose-Capillary: 132 mg/dL — ABNORMAL HIGH (ref 70–99)

## 2023-03-20 LAB — COOXEMETRY PANEL
Carboxyhemoglobin: 1.4 % (ref 0.5–1.5)
Methemoglobin: <0.7 % (ref 0.0–1.5)
O2 Saturation: 77.3 %
Total hemoglobin: 13.2 g/dL (ref 12.0–16.0)

## 2023-03-20 LAB — CBC WITH DIFFERENTIAL/PLATELET
Abs Immature Granulocytes: 0.01 10*3/uL (ref 0.00–0.07)
Basophils Absolute: 0.1 10*3/uL (ref 0.0–0.1)
Basophils Relative: 1 %
Eosinophils Absolute: 0.1 10*3/uL (ref 0.0–0.5)
Eosinophils Relative: 1 %
HCT: 40.2 % (ref 39.0–52.0)
Hemoglobin: 13 g/dL (ref 13.0–17.0)
Immature Granulocytes: 0 %
Lymphocytes Relative: 32 %
Lymphs Abs: 2 10*3/uL (ref 0.7–4.0)
MCH: 29.3 pg (ref 26.0–34.0)
MCHC: 32.3 g/dL (ref 30.0–36.0)
MCV: 90.7 fL (ref 80.0–100.0)
Monocytes Absolute: 1 10*3/uL (ref 0.1–1.0)
Monocytes Relative: 15 %
Neutro Abs: 3.1 10*3/uL (ref 1.7–7.7)
Neutrophils Relative %: 51 %
Platelets: 229 10*3/uL (ref 150–400)
RBC: 4.43 MIL/uL (ref 4.22–5.81)
RDW: 13.5 % (ref 11.5–15.5)
WBC: 6.2 10*3/uL (ref 4.0–10.5)
nRBC: 0 % (ref 0.0–0.2)

## 2023-03-20 LAB — COMPREHENSIVE METABOLIC PANEL
ALT: 17 U/L (ref 0–44)
AST: 22 U/L (ref 15–41)
Albumin: 3.3 g/dL — ABNORMAL LOW (ref 3.5–5.0)
Alkaline Phosphatase: 40 U/L (ref 38–126)
Anion gap: 8 (ref 5–15)
BUN: 15 mg/dL (ref 8–23)
CO2: 28 mmol/L (ref 22–32)
Calcium: 9 mg/dL (ref 8.9–10.3)
Chloride: 102 mmol/L (ref 98–111)
Creatinine, Ser: 1.38 mg/dL — ABNORMAL HIGH (ref 0.61–1.24)
GFR, Estimated: 56 mL/min — ABNORMAL LOW (ref >60)
Glucose, Bld: 160 mg/dL — ABNORMAL HIGH (ref 70–99)
Potassium: 3.6 mmol/L (ref 3.5–5.1)
Sodium: 138 mmol/L (ref 135–145)
Total Bilirubin: 0.8 mg/dL (ref 0.0–1.2)
Total Protein: 6.6 g/dL (ref 6.5–8.1)

## 2023-03-20 LAB — MAGNESIUM: Magnesium: 2.3 mg/dL (ref 1.7–2.4)

## 2023-03-20 LAB — TSH: TSH: 9.887 u[IU]/mL — ABNORMAL HIGH (ref 0.350–4.500)

## 2023-03-20 LAB — BRAIN NATRIURETIC PEPTIDE: B Natriuretic Peptide: 1030.3 pg/mL — ABNORMAL HIGH (ref 0.0–100.0)

## 2023-03-20 LAB — MRSA NEXT GEN BY PCR, NASAL: MRSA by PCR Next Gen: NOT DETECTED

## 2023-03-20 SURGERY — RIGHT HEART CATH
Anesthesia: LOCAL

## 2023-03-20 MED ORDER — SODIUM CHLORIDE 0.9 % IV SOLN: 250.0000 mL | INTRAVENOUS | Status: AC | PRN

## 2023-03-20 MED ORDER — ONDANSETRON HCL 4 MG/2ML IJ SOLN: 4.0000 mg | Freq: Four times a day (QID) | INTRAMUSCULAR | Status: DC | PRN

## 2023-03-20 MED ORDER — ROSUVASTATIN CALCIUM 20 MG PO TABS
40.0000 mg | ORAL_TABLET | Freq: Every day | ORAL | Status: DC
  Administered 2023-03-20 – 2023-03-24 (×5): 40 mg via ORAL
  Filled 2023-03-20 (×5): qty 2

## 2023-03-20 MED ORDER — SODIUM CHLORIDE 0.9% FLUSH: 3.0000 mL | INTRAVENOUS | Status: DC | PRN

## 2023-03-20 MED ORDER — CHLORHEXIDINE GLUCONATE CLOTH 2 % EX PADS
6.0000 | MEDICATED_PAD | Freq: Every day | CUTANEOUS | Status: DC
  Administered 2023-03-20 – 2023-03-23 (×3): 6 via TOPICAL

## 2023-03-20 MED ORDER — LIDOCAINE HCL (PF) 1 % IJ SOLN
INTRAMUSCULAR | Status: AC
  Filled 2023-03-20: qty 30

## 2023-03-20 MED ORDER — MIDAZOLAM HCL 2 MG/2ML IJ SOLN
INTRAMUSCULAR | Status: DC | PRN
  Administered 2023-03-20: 1 mg via INTRAVENOUS

## 2023-03-20 MED ORDER — SODIUM CHLORIDE 0.9% IV SOLUTION: INTRAVENOUS | Status: DC | PRN

## 2023-03-20 MED ORDER — SODIUM CHLORIDE 0.9% IV SOLUTION
INTRAVENOUS | Status: DC
  Administered 2023-03-23: 200 mL via INTRAVENOUS

## 2023-03-20 MED ORDER — ACETAMINOPHEN 325 MG PO TABS: 650.0000 mg | ORAL_TABLET | ORAL | Status: DC | PRN

## 2023-03-20 MED ORDER — MILRINONE LACTATE IN DEXTROSE 20-5 MG/100ML-% IV SOLN
0.1250 ug/kg/min | INTRAVENOUS | Status: DC
  Administered 2023-03-20 (×2): 0.25 ug/kg/min via INTRAVENOUS
  Filled 2023-03-20: qty 100

## 2023-03-20 MED ORDER — SODIUM CHLORIDE 0.9 % IV SOLN: INTRAVENOUS | Status: AC

## 2023-03-20 MED ORDER — SODIUM CHLORIDE 0.9 % IV SOLN: INTRAVENOUS | Status: DC

## 2023-03-20 MED ORDER — ONDANSETRON HCL 4 MG/2ML IJ SOLN
4.0000 mg | Freq: Four times a day (QID) | INTRAMUSCULAR | Status: DC | PRN
Start: 2023-03-20 — End: 2023-03-24

## 2023-03-20 MED ORDER — ASPIRIN 81 MG PO CHEW
81.0000 mg | CHEWABLE_TABLET | ORAL | Status: DC
Start: 2023-03-21 — End: 2023-03-20

## 2023-03-20 MED ORDER — ORAL CARE MOUTH RINSE: 15.0000 mL | OROMUCOSAL | Status: DC | PRN

## 2023-03-20 MED ORDER — FENTANYL CITRATE (PF) 100 MCG/2ML IJ SOLN
INTRAMUSCULAR | Status: DC | PRN
  Administered 2023-03-20: 25 ug via INTRAVENOUS

## 2023-03-20 MED ORDER — MIDAZOLAM HCL 2 MG/2ML IJ SOLN
INTRAMUSCULAR | Status: AC
  Filled 2023-03-20: qty 2

## 2023-03-20 MED ORDER — LIDOCAINE HCL (PF) 1 % IJ SOLN
INTRAMUSCULAR | Status: DC | PRN
  Administered 2023-03-20: 2 mL

## 2023-03-20 MED ORDER — DIGOXIN 125 MCG PO TABS
0.0625 mg | ORAL_TABLET | Freq: Every day | ORAL | Status: DC
  Administered 2023-03-20 – 2023-03-24 (×5): 0.0625 mg via ORAL
  Filled 2023-03-20 (×5): qty 1

## 2023-03-20 MED ORDER — SODIUM CHLORIDE 0.9% FLUSH
3.0000 mL | Freq: Two times a day (BID) | INTRAVENOUS | Status: DC
  Administered 2023-03-20 – 2023-03-24 (×8): 3 mL via INTRAVENOUS

## 2023-03-20 MED ORDER — AMIODARONE HCL 200 MG PO TABS
200.0000 mg | ORAL_TABLET | Freq: Two times a day (BID) | ORAL | Status: DC
  Administered 2023-03-20 – 2023-03-21 (×3): 200 mg via ORAL
  Filled 2023-03-20 (×3): qty 1

## 2023-03-20 MED ORDER — INSULIN ASPART 100 UNIT/ML IJ SOLN: 0.0000 [IU] | Freq: Every day | INTRAMUSCULAR | Status: DC

## 2023-03-20 MED ORDER — ACETAMINOPHEN 325 MG PO TABS
650.0000 mg | ORAL_TABLET | ORAL | Status: DC | PRN
  Administered 2023-03-20 – 2023-03-21 (×2): 650 mg via ORAL
  Filled 2023-03-20 (×2): qty 2

## 2023-03-20 MED ORDER — APIXABAN 5 MG PO TABS
5.0000 mg | ORAL_TABLET | Freq: Two times a day (BID) | ORAL | Status: DC
  Administered 2023-03-20: 5 mg via ORAL
  Filled 2023-03-20: qty 1

## 2023-03-20 MED ORDER — TORSEMIDE 20 MG PO TABS
20.0000 mg | ORAL_TABLET | Freq: Every evening | ORAL | Status: DC
  Administered 2023-03-20 – 2023-03-21 (×2): 20 mg via ORAL
  Filled 2023-03-20 (×2): qty 1

## 2023-03-20 MED ORDER — FENTANYL CITRATE (PF) 100 MCG/2ML IJ SOLN
INTRAMUSCULAR | Status: AC
  Filled 2023-03-20: qty 2

## 2023-03-20 MED ORDER — SILDENAFIL CITRATE 20 MG PO TABS
20.0000 mg | ORAL_TABLET | Freq: Three times a day (TID) | ORAL | Status: DC
  Administered 2023-03-20 (×2): 20 mg via ORAL
  Filled 2023-03-20 (×2): qty 1

## 2023-03-20 MED ORDER — SPIRONOLACTONE 12.5 MG HALF TABLET
12.5000 mg | ORAL_TABLET | Freq: Every day | ORAL | Status: DC
  Administered 2023-03-20 – 2023-03-24 (×5): 12.5 mg via ORAL
  Filled 2023-03-20 (×5): qty 1

## 2023-03-20 MED ORDER — INSULIN ASPART 100 UNIT/ML IJ SOLN
0.0000 [IU] | Freq: Three times a day (TID) | INTRAMUSCULAR | Status: DC
  Administered 2023-03-20 – 2023-03-21 (×3): 3 [IU] via SUBCUTANEOUS
  Administered 2023-03-21: 2 [IU] via SUBCUTANEOUS
  Administered 2023-03-22: 8 [IU] via SUBCUTANEOUS
  Administered 2023-03-23: 5 [IU] via SUBCUTANEOUS
  Administered 2023-03-23: 2 [IU] via SUBCUTANEOUS
  Administered 2023-03-24: 5 [IU] via SUBCUTANEOUS
  Administered 2023-03-24: 2 [IU] via SUBCUTANEOUS

## 2023-03-20 MED ORDER — TORSEMIDE 20 MG PO TABS: 40.0000 mg | ORAL_TABLET | Freq: Every morning | ORAL | Status: DC

## 2023-03-20 SURGICAL SUPPLY — 7 items
CATH SWAN GANZ 7F STRAIGHT (CATHETERS) IMPLANT
PACK CARDIAC CATHETERIZATION (CUSTOM PROCEDURE TRAY) ×2 IMPLANT
SHEATH PINNACLE 7F 10CM (SHEATH) IMPLANT
TRANSDUCER W/STOPCOCK (MISCELLANEOUS) IMPLANT
TUBING ART PRESS 72 MALE/FEM (TUBING) IMPLANT
WIRE MICRO SET SILHO 5FR 7 (SHEATH) IMPLANT
WIRE MICROINTRODUCER 60CM (WIRE) IMPLANT

## 2023-03-20 NOTE — Plan of Care (Signed)
  Problem: Education: Goal: Understanding of CV disease, CV risk reduction, and recovery process will improve Outcome: Progressing Goal: Individualized Educational Video(s) Outcome: Progressing   Problem: Activity: Goal: Ability to return to baseline activity level will improve Outcome: Progressing   Problem: Cardiovascular: Goal: Ability to achieve and maintain adequate cardiovascular perfusion will improve Outcome: Progressing Goal: Vascular access site(s) Level 0-1 will be maintained Outcome: Progressing   Problem: Health Behavior/Discharge Planning: Goal: Ability to safely manage health-related needs after discharge will improve Outcome: Progressing   Problem: Education: Goal: Knowledge of General Education information will improve Description: Including pain rating scale, medication(s)/side effects and non-pharmacologic comfort measures Outcome: Progressing   Problem: Health Behavior/Discharge Planning: Goal: Ability to manage health-related needs will improve Outcome: Progressing   Problem: Clinical Measurements: Goal: Ability to maintain clinical measurements within normal limits will improve Outcome: Progressing Goal: Will remain free from infection Outcome: Progressing Goal: Diagnostic test results will improve Outcome: Progressing Goal: Respiratory complications will improve Outcome: Progressing Goal: Cardiovascular complication will be avoided Outcome: Progressing   Problem: Activity: Goal: Risk for activity intolerance will decrease Outcome: Progressing   Problem: Nutrition: Goal: Adequate nutrition will be maintained Outcome: Progressing   Problem: Coping: Goal: Level of anxiety will decrease Outcome: Progressing   Problem: Elimination: Goal: Will not experience complications related to bowel motility Outcome: Progressing Goal: Will not experience complications related to urinary retention Outcome: Progressing   Problem: Pain Managment: Goal:  General experience of comfort will improve and/or be controlled Outcome: Progressing   Problem: Safety: Goal: Ability to remain free from injury will improve Outcome: Progressing   Problem: Skin Integrity: Goal: Risk for impaired skin integrity will decrease Outcome: Progressing   Problem: Education: Goal: Ability to describe self-care measures that may prevent or decrease complications (Diabetes Survival Skills Education) will improve Outcome: Progressing Goal: Individualized Educational Video(s) Outcome: Progressing   Problem: Coping: Goal: Ability to adjust to condition or change in health will improve Outcome: Progressing   Problem: Fluid Volume: Goal: Ability to maintain a balanced intake and output will improve Outcome: Progressing   Problem: Health Behavior/Discharge Planning: Goal: Ability to identify and utilize available resources and services will improve Outcome: Progressing Goal: Ability to manage health-related needs will improve Outcome: Progressing   Problem: Metabolic: Goal: Ability to maintain appropriate glucose levels will improve Outcome: Progressing   Problem: Nutritional: Goal: Maintenance of adequate nutrition will improve Outcome: Progressing Goal: Progress toward achieving an optimal weight will improve Outcome: Progressing   Problem: Skin Integrity: Goal: Risk for impaired skin integrity will decrease Outcome: Progressing   Problem: Tissue Perfusion: Goal: Adequacy of tissue perfusion will improve Outcome: Progressing

## 2023-03-20 NOTE — Progress Notes (Signed)
   Arrived from cath lab with Milrinone 0.25 mcg infusing. Of note he does not eat Pork or Beef products.    PA 61/27 (38)  CO 3.5 CI 1.9   Denies chest pain. Denies shortness of breath.   Sharran Caratachea NP-C  4:27 PM

## 2023-03-20 NOTE — Interval H&P Note (Signed)
 History and Physical Interval Note:  03/20/2023 10:43 AM  Garrett Roberts  has presented today for surgery, with the diagnosis of hf.  The various methods of treatment have been discussed with the patient and family. After consideration of risks, benefits and other options for treatment, the patient has consented to  Procedure(s): RIGHT HEART CATH (N/A) as a surgical intervention.  The patient's history has been reviewed, patient examined, no change in status, stable for surgery.  I have reviewed the patient's chart and labs.  Questions were answered to the patient's satisfaction.     Laymon Stockert [employer]

## 2023-03-20 NOTE — H&P (Signed)
 Advanced Heart Failure Team History and Physical Note   PCP:  Pcp, No  PCP-Cardiology: Elder Negus, MD     Reason for Admission: CHF   HPI:    68 y.o. with history of CAD, DM II, HTN, HLD, ischemic cardiomyopathy/chronic systolic CHF.    Patient had inferior STEMI 10/23 s/p PCI/DES to RCA. Had residual diffuse disease in diagonals and OMs. EF 40-45% at time of cath. Subsequent echo in 10/23 with EF 55-60%.    He was not taking medications in 2024 after losing insurance. Presented with anterior STEMI 12/10/22. Cardiac cath with 100% proximal LAD treated with PCI/DES. Prior RCA stent patent, severe disease in tortuous ramus and distal LCx treated medically.  Echo with EF 25%, no LV thrombus, AK mid septum to apex, moderately reduced RV systolic function. Course complicated by atrial fibrillation with RVR. Started on IV amiodarone and underwent DCCV to SR.  GDMT limited by soft blood pressure. Also seen by GI for abdominal pain. Had thickening of stomach on CT scan. H. Pylori later resulted positive after discharge, seen by GI and started on treatment for H pylori-related gastritis.    Returned to ED 12/19/22 with recurrent atrial fibrillation. He converted to SR while in waiting room and was discharged home.   Admitted 2/25 with acute CHF. Diuresed with IV lasix. GDMT cut back 2/2 to low BP, Entresto and spironolactone stopped. He was discharged home, weight 154 lbs.  Echo in 2/25 showed EF 25% with mild RV dysfunction and mild RV enlargement, PASP 60 mmHg, moderate MR.    He was seen again in the ER 03/14/23 with worsening fatigue and dyspnea.  Torsemide was increased to 40 qam/20 qpm.     Breathing has improved on higher torsemide dose.  However, he still has profound fatigue and poor appetite.  He is very tired just walking around his house but not particularly short of breath. No palpitations or chest pain.  He has 2 pillow orthopnea chronically.  Occasional lightheadedness if he  stands up too fast.  GDMT has been limited by low BP, SBP generally 80s-90s.  With ongoing symptoms concerning for low output, RHC was arranged for today with plan to likely admit afterwards.   RHC Procedural Findings (today): Hemodynamics (mmHg) RA mean 2 RV 58/5 PA 60/18, mean 31 PCWP mean 17 Oxygen saturations: PA 70% AO 99% Cardiac Output (Thermo) 3.81  Cardiac Index (Thermo) 2.05 PVR 3.7 WU Cardiac Output (Fick) 4.68  Cardiac Index (Fick) 2.52  PVR 3 WU PAPi 21    Review of Systems: All systems reviewed and negative except as per HPI.    Home Medications Prior to Admission medications   Medication Sig Start Date End Date Taking? Authorizing Provider  amiodarone (PACERONE) 200 MG tablet Take 0.5 tablets (100 mg total) by mouth daily. 03/07/23  Yes Milford, Anderson Malta, FNP  Ascorbic Acid (VITAMIN C PO) Take 1 tablet by mouth daily with lunch.   Yes [provider]  clopidogrel (PLAVIX) 75 MG tablet Take 1 tablet (75 mg total) by mouth daily. 12/27/22  Yes Andrey Farmer, PA-C  Cyanocobalamin (B-12 PO) Take 1 tablet by mouth daily with lunch.   Yes [provider]  digoxin (LANOXIN) 0.125 MG tablet Take 1 tablet (0.125 mg total) by mouth daily. 03/16/23  Yes Laurey Morale, MD  empagliflozin (JARDIANCE) 10 MG TABS tablet Take 1 tablet (10 mg total) by mouth daily before breakfast. 01/16/23  Yes Louanne Skye Devoria Albe., NP  ezetimibe (ZETIA) 10 MG tablet Take 1 tablet (10 mg total) by mouth daily. 12/27/22  Yes Andrey Farmer, PA-C  MAGNESIUM PO Take 1 tablet by mouth at bedtime.   Yes [provider]  metFORMIN (GLUCOPHAGE) 500 MG tablet Take 1 tablet (500 mg total) by mouth daily with breakfast. Courtesy fill- Additional refills will need to come from PCP 02/10/23  Yes Laurey Morale, MD  Multiple Vitamins-Minerals (ZINC PO) Take 1 tablet by mouth daily after lunch.   Yes [provider]  ondansetron (ZOFRAN-ODT) 4 MG disintegrating  tablet Dissolve 1 tablet (4 mg total) in mouth every 8 (eight) hours as needed for nausea or vomiting. 03/07/23  Yes Milford, Anderson Malta, FNP  pantoprazole (PROTONIX) 40 MG tablet Take 1 tablet (40 mg total) by mouth 2 (two) times daily before a meal. 02/06/23  Yes Laurey Morale, MD  potassium chloride SA (KLOR-CON M) 20 MEQ tablet Take 1 tablet (20 mEq total) by mouth daily. 03/09/23  Yes Milford, Anderson Malta, FNP  rosuvastatin (CRESTOR) 40 MG tablet Take 1 tablet (40 mg total) by mouth daily. 02/10/23 05/11/23 Yes Laurey Morale, MD  spironolactone (ALDACTONE) 25 MG tablet Take 0.5 tablets (12.5 mg total) by mouth at bedtime. 03/16/23  Yes Laurey Morale, MD  torsemide (DEMADEX) 20 MG tablet Take 40 mg by mouth in the morning. 1 tab in the evening   Yes [provider]  Vitamin D-Vitamin K (VITAMIN K2-VITAMIN D3 PO) Take 1 tablet by mouth daily with lunch.   Yes [provider]  apixaban (ELIQUIS) 5 MG TABS tablet Take 1 tablet (5 mg total) by mouth 2 (two) times daily. 01/16/23   Gaston Islam., NP  nitroGLYCERIN (NITROSTAT) 0.4 MG SL tablet Place 1 tablet (0.4 mg total) under the tongue every 5 (five) minutes x 3 doses as needed for chest pain. 12/15/22   Arty Baumgartner, NP    Past Medical History: 1. Chronic systolic CHF: Ischemic cardiomyopathy.   - Echo (12/24): EF 20-25%, LAD territory WMAs, moderate LVH, moderately decreased RV systolic function, mild MR.  - Echo (2/25): EF 25% with mild RV dysfunction and mild RV enlargement, PASP 60 mmHg, moderate MR.  2. CAD: Inferior STEMI 10/23 with DES to RCA.  - Anterior STEMI (11/24): Occluded proximal LAD treated with DES, occluded D2, 90% OM1, 95% distal LCx.  Distal LCx and OM1 treated medically.  3. Atrial fibrillation: Paroxysmal.  - DCCV in 12/24.  4. H pylori gastritis 5. Hyperlipidemia 6. Type 2 diabetes  Past Surgical History: Past Surgical History:  Procedure Laterality Date   CARDIOVERSION N/A 12/14/2022    Procedure: CARDIOVERSION;  Surgeon: Little Ishikawa, MD;  Location: [facility] INVASIVE CV LAB;  Service: Cardiovascular;  Laterality: N/A;   CORONARY/GRAFT ACUTE MI REVASCULARIZATION N/A 10/30/2021   Procedure: Coronary/Graft Acute MI Revascularization;  Surgeon: Yates Decamp, MD;  Location: [facility] INVASIVE CV LAB;  Service: Cardiovascular;  Laterality: N/A;   CORONARY/GRAFT ACUTE MI REVASCULARIZATION N/A 12/10/2022   Procedure: Coronary/Graft Acute MI Revascularization;  Surgeon: Tonny Bollman, MD;  Location: [facility] INVASIVE CV LAB;  Service: Cardiovascular;  Laterality: N/A;   LEFT HEART CATH AND CORONARY ANGIOGRAPHY N/A 10/30/2021   Procedure: LEFT HEART CATH AND CORONARY ANGIOGRAPHY;  Surgeon: Yates Decamp, MD;  Location: [facility] INVASIVE CV LAB;  Service: Cardiovascular;  Laterality: N/A;   LEFT HEART CATH AND CORONARY ANGIOGRAPHY N/A 12/10/2022   Procedure: LEFT HEART CATH AND CORONARY ANGIOGRAPHY;  Surgeon: Tonny Bollman, MD;  Location: [facility] INVASIVE CV LAB;  Service: Cardiovascular;  Laterality: N/A;    Family History:  Family History  Problem Relation Age of Onset   Diabetes Mother    Chronic Renal Failure Father    Diabetes Father    Chronic Renal Failure Brother    Diabetes Brother    Diabetes Brother    Heart attack Brother    Stroke Brother     Social History: Social History   Socioeconomic History   Marital status: Married    Spouse name: Amani   Number of children: 5   Years of education: Not on file   Highest education level: High school graduate  Occupational History   Occupation: part time  Tobacco Use   Smoking status: Never   Smokeless tobacco: Never  Vaping Use   Vaping status: Never Used  Substance and Sexual Activity   Alcohol use: No   Drug use: No   Sexual activity: Not on file  Other Topics Concern   Not on file  Social History Narrative   ** Merged History Encounter **       Social Drivers of Health   Financial Resource Strain: High Risk (12/13/2022)    Overall Financial Resource Strain (CARDIA)    Difficulty of Paying Living Expenses: Very hard  Food Insecurity: No Food Insecurity (02/17/2023)   Hunger Vital Sign    Worried About Running Out of Food in the Last Year: Never true    Ran Out of Food in the Last Year: Never true  Transportation Needs: No Transportation Needs (02/17/2023)   PRAPARE - Administrator, Civil Service (Medical): No    Lack of Transportation (Non-Medical): No  Physical Activity: Sufficiently Active (11/27/2019)   Received from [facility], [facility]   Exercise Vital Sign    Days of Exercise per Week: 4 days    Minutes of Exercise per Session: 60 min  Stress: Stress Concern Present (11/27/2019)   Received from [facility], [facility] of Occupational [facility] - Occupational Stress Questionnaire    Feeling of Stress : To some extent  Social Connections: Patient Declined (02/17/2023)   Social Connection and Isolation Panel [NHANES]    Frequency of Communication with Friends and Family: Patient declined    Frequency of Social Gatherings with Friends and Family: Patient declined    Attends Religious Services: Patient declined    Database administrator or Organizations: Patient declined    Attends Banker Meetings: Patient declined    Marital Status: Patient declined    Allergies:  Allergies  Allergen Reactions   Beef-Derived Drug Products Other (See Comments)    Religious observance   Pork-Derived Products Other (See Comments)    Religious observance    Objective:    Vital Signs:   Temp:  [97.8 F (36.6 C)] 97.8 F (36.6 C) (03/10 0833) Pulse Rate:  [90] 90 (03/10 0833) Resp:  [17] 17 (03/10 0833) BP: (103)/(75) 103/75 (03/10 0833) SpO2:  [98 %-100 %] 100 % (03/10 1027) Weight:  [65.8 kg] 65.8 kg (03/10 0833)   Filed Weights   03/20/23 0833  Weight: 65.8 kg     Physical Exam     General:  Thin, NAD HEENT: Normal Neck: Supple. no JVD. Carotids  2+ bilat; no bruits. No lymphadenopathy or thyromegaly appreciated. Cor: PMI lateral. Regular rate & rhythm. No rubs, gallops or murmurs. Lungs: Clear Abdomen: Soft, nontender, nondistended. No hepatosplenomegaly. No bruits or masses. Good bowel  sounds. Extremities: No cyanosis, clubbing, rash, edema Neuro: Alert & oriented x 3, cranial nerves grossly intact. moves all 4 extremities w/o difficulty. Affect pleasant.   Telemetry   NSR 80s (personally reviewed)  EKG   Pending  Labs     Basic Metabolic Panel: Recent Labs  Lab 03/14/23 1942  NA 134*  K 3.5  CL 97*  CO2 25  GLUCOSE 167*  BUN 15  CREATININE 1.44*  CALCIUM 9.4    Liver Function Tests: No results for input(s): "AST", "ALT", "ALKPHOS", "BILITOT", "PROT", "ALBUMIN" in the last 168 hours. No results for input(s): "LIPASE", "AMYLASE" in the last 168 hours. No results for input(s): "AMMONIA" in the last 168 hours.  CBC: Recent Labs  Lab 03/14/23 1942  WBC 6.6  NEUTROABS 3.2  HGB 13.4  HCT 42.5  MCV 91.2  PLT 284    Cardiac Enzymes: No results for input(s): "CKTOTAL", "CKMB", "CKMBINDEX", "TROPONINI" in the last 168 hours.  BNP: BNP (last 3 results) Recent Labs    02/27/23 1042 03/07/23 1215 03/14/23 1942  BNP 481.2* 603.8* 812.0*    ProBNP (last 3 results) No results for input(s): "PROBNP" in the last 8760 hours.   CBG: Recent Labs  Lab 03/20/23 0828  GLUCAP 154*    Coagulation Studies: No results for input(s): "LABPROT", "INR" in the last 72 hours.  Imaging: CARDIAC CATHETERIZATION Result Date: 03/20/2023 1. Normal RA pressure. 2. Borderline elevated PCWP 3. Moderate mixed pulmonary arterial/pulmonary venous hypertension.  4. Low CO by thermodilution, preserved by Fick.     Assessment/Plan   1. 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 Echo 2/25 with EF 25%,  mildly reduced RV function. NYHA class IIIb-IV, primarily due to profound fatigue.  Low BP limits GDMT titration, SBP 80s-90s at home.  His profound fatigue has been concerning for low output HF, RHC set up for today.  Today's RHC showed low CI by thermo (2.05) but preserved by Fick.  Borderline elevated PCWP, normal RA pressure.  There was also moderate mixed pulmonary arterial/pulmonary venous hypertension.  With his significant symptoms, I am going to admit him and start him on milrinone 0.25.  Will also add sildenafil to try to lower PA pressure and watch him with Swan in.  - Start milrinone 0.25 and follow Swan numbers.  - Start sildenafil 20 tid for moderate pulmonary arterial hypertension.   - Continue torsemide 40 qam/20 qpm. PCWP 17, RA pressure 2.  - Continue Jardiance 10 mg daily. -Continue digoxin 0.0625 daily (use low dose with relatively small size and CKD stage 3).  - I have stopped Toprol XL with concern for low output.  - Continue spironolactone 12.5 at bedtime.  - Will get CMET, BNP today.  - Will defer discussion of ICD for now given concern for need for advanced therapies.  He qualifies with persistently low EF.  Narrow QRS so not CRT candidate.   - Cardiac output is low by thermo but not Fick.  Symptomatically NYHA IV.  Will admit for med titration/milrinone and treatment of pulmonary hypertension.  May be LVAD candidate, will have to see how he does.  2. CAD:  Hx inferior STEMI 10/23 s/p PCI/DES to RCA. Anterior STEMI 12/10/22, cath showed occluded pLAD treated with PCI/DES, prior RCA stent patent, severe disease OM1 and distal LCx treated medically. No chest pain.  - Continue Plavix and Eliquis.  - Continue rosuvastatin  40 mg + Zetia 10 mg daily. 3. Atrial fibrillation: Paroxysmal. NSR today.  - Continue Eliquis 5 mg BID - Will increase amiodarone to 200 mg daily while he is on milrinone.  4. DM II: Continue Jardiance, add SSI in hospital.  5. Pulmonary hypertension:  Moderated mixed pulmonary arterial/pulmonary venous hypertension. Not a smoker, no known lung disease. CXR has shown some chronic interstitial changes.  - I will arrange for high resolution CT chest w/o contrast.  - Sildenafil and milrinone as above.  - Will need V/Q scan to rule out chronic PE at some point.     Marca Ancona, MD 03/20/2023, 11:26 AM  Advanced Heart Failure Team Pager [phone] (M-F; 7a - 5p)  Please contact [facility] for night-coverage after hours (4p -7a ) and weekends on [email or web address]

## 2023-03-20 NOTE — Progress Notes (Signed)
 Heart Failure Navigator Progress Note  Assessed for Heart & Vascular TOC clinic readiness.  Patient does not meet criteria due to Advanced Heart Failure Team patient of Dr. Shirlee Latch.   Navigator will sign off at this time.    Rhae Hammock, BSN, Scientist, clinical (histocompatibility and immunogenetics) Only

## 2023-03-21 ENCOUNTER — Inpatient Hospital Stay (HOSPITAL_COMMUNITY): Payer: Self-pay

## 2023-03-21 ENCOUNTER — Ambulatory Visit: Payer: Self-pay | Admitting: Nurse Practitioner

## 2023-03-21 ENCOUNTER — Ambulatory Visit: Payer: MEDICAID | Admitting: Cardiology

## 2023-03-21 ENCOUNTER — Encounter (HOSPITAL_COMMUNITY): Payer: Self-pay | Admitting: Cardiology

## 2023-03-21 DIAGNOSIS — I5023 Acute on chronic systolic (congestive) heart failure: Secondary | ICD-10-CM

## 2023-03-21 DIAGNOSIS — J90 Pleural effusion, not elsewhere classified: Secondary | ICD-10-CM

## 2023-03-21 LAB — GLUCOSE, PLEURAL OR PERITONEAL FLUID: Glucose, Fluid: 179 mg/dL

## 2023-03-21 LAB — BASIC METABOLIC PANEL
Anion gap: 12 (ref 5–15)
BUN: 17 mg/dL (ref 8–23)
CO2: 24 mmol/L (ref 22–32)
Calcium: 8.7 mg/dL — ABNORMAL LOW (ref 8.9–10.3)
Chloride: 103 mmol/L (ref 98–111)
Creatinine, Ser: 1.5 mg/dL — ABNORMAL HIGH (ref 0.61–1.24)
GFR, Estimated: 51 mL/min — ABNORMAL LOW (ref >60)
Glucose, Bld: 150 mg/dL — ABNORMAL HIGH (ref 70–99)
Potassium: 3.3 mmol/L — ABNORMAL LOW (ref 3.5–5.1)
Sodium: 139 mmol/L (ref 135–145)

## 2023-03-21 LAB — BODY FLUID CELL COUNT WITH DIFFERENTIAL
Eos, Fluid: 0 %
Lymphs, Fluid: 77 %
Monocyte-Macrophage-Serous Fluid: 21 % — ABNORMAL LOW (ref 50–90)
Neutrophil Count, Fluid: 2 % (ref 0–25)
Total Nucleated Cell Count, Fluid: 770 uL (ref 0–1000)

## 2023-03-21 LAB — PROTEIN, PLEURAL OR PERITONEAL FLUID: Total protein, fluid: <3 g/dL

## 2023-03-21 LAB — COOXEMETRY PANEL
Carboxyhemoglobin: 1.8 % — ABNORMAL HIGH (ref 0.5–1.5)
Methemoglobin: <0.7 % (ref 0.0–1.5)
O2 Saturation: 72.7 %
Total hemoglobin: 13 g/dL (ref 12.0–16.0)

## 2023-03-21 LAB — GLUCOSE, CAPILLARY
Glucose-Capillary: 145 mg/dL — ABNORMAL HIGH (ref 70–99)
Glucose-Capillary: 154 mg/dL — ABNORMAL HIGH (ref 70–99)
Glucose-Capillary: 196 mg/dL — ABNORMAL HIGH (ref 70–99)
Glucose-Capillary: 144 mg/dL — ABNORMAL HIGH (ref 70–99)
Glucose-Capillary: 117 mg/dL — ABNORMAL HIGH (ref 70–99)

## 2023-03-21 LAB — LACTATE DEHYDROGENASE, PLEURAL OR PERITONEAL FLUID: LD, Fluid: 71 U/L — ABNORMAL HIGH (ref 3–23)

## 2023-03-21 MED ORDER — SILDENAFIL CITRATE 20 MG PO TABS
40.0000 mg | ORAL_TABLET | Freq: Three times a day (TID) | ORAL | Status: DC
  Administered 2023-03-21 – 2023-03-24 (×9): 40 mg via ORAL
  Filled 2023-03-21 (×10): qty 2

## 2023-03-21 MED ORDER — TORSEMIDE 20 MG PO TABS: 40.0000 mg | ORAL_TABLET | Freq: Every morning | ORAL | Status: DC

## 2023-03-21 MED ORDER — APIXABAN 5 MG PO TABS
5.0000 mg | ORAL_TABLET | Freq: Two times a day (BID) | ORAL | Status: DC
  Administered 2023-03-21 – 2023-03-22 (×2): 5 mg via ORAL
  Filled 2023-03-21 (×2): qty 1

## 2023-03-21 MED ORDER — PANTOPRAZOLE SODIUM 40 MG IV SOLR
40.0000 mg | Freq: Every day | INTRAVENOUS | Status: DC
  Administered 2023-03-21: 40 mg via INTRAVENOUS
  Filled 2023-03-21: qty 10

## 2023-03-21 MED ORDER — POTASSIUM CHLORIDE CRYS ER 20 MEQ PO TBCR
40.0000 meq | EXTENDED_RELEASE_TABLET | Freq: Once | ORAL | Status: AC
  Administered 2023-03-21: 40 meq via ORAL
  Filled 2023-03-21: qty 2

## 2023-03-21 NOTE — Progress Notes (Signed)
 Patient ID: Garrett Roberts, male   DOB: Sep 10, 1955, 68 y.o.   MRN: [id number]     Advanced Heart Failure Rounding Note  Cardiologist: Elder Negus, MD  Chief Complaint: CHF Subjective:    No complaints this morning.  Feels good but does not note much change with milrinone.  He is on milrinone 0.25 with co-ox 73% this morning. Creatinine 1.5 this morning.   CT report not back but has moderate right pleural effusion.   Swan numbers:  RA 2 PA 51/20 Unable to get accurate wedge (overwedges) CI 2.82  RHC Procedural Findings (3/10): Hemodynamics (mmHg) RA mean 2 RV 58/5 PA 60/18, mean 31 PCWP mean 17 Oxygen saturations: PA 70% AO 99% Cardiac Output (Thermo) 3.81  Cardiac Index (Thermo) 2.05 PVR 3.7 WU Cardiac Output (Fick) 4.68  Cardiac Index (Fick) 2.52  PVR 3 WU PAPi 21   Objective:   Weight Range: 68.2 kg Body mass index is 20.39 kg/m.   Vital Signs:   Temp:  [97.3 F (36.3 C)-98.1 F (36.7 C)] 97.9 F (36.6 C) (03/11 0600) Pulse Rate:  [0-100] 68 (03/11 0600) Resp:  [10-30] 13 (03/11 0600) BP: (73-108)/(41-75) 101/63 (03/11 0600) SpO2:  [82 %-100 %] 99 % (03/11 0600) Weight:  [65.8 kg-70.8 kg] 68.2 kg (03/11 0520) Last BM Date : 03/19/23  Weight change: Filed Weights   03/20/23 0833 03/20/23 1634 03/21/23 0520  Weight: 65.8 kg 70.8 kg 68.2 kg    Intake/Output:   Intake/Output Summary (Last 24 hours) at 03/21/2023 0659 Last data filed at 03/21/2023 0600 Gross per 24 hour  Intake 959.33 ml  Output 1400 ml  Net -440.67 ml      Physical Exam    General:  Well appearing. No resp difficulty HEENT: Normal Neck: Supple. JVP not elevated. Carotids 2+ bilat; no bruits. No lymphadenopathy or thyromegaly appreciated. Cor: PMI nondisplaced. Regular rate & rhythm. No rubs, gallops or murmurs. Lungs: Decreased BS right base Abdomen: Soft, nontender, nondistended. No hepatosplenomegaly. No bruits or masses. Good bowel sounds. Extremities: No  cyanosis, clubbing, rash, edema Neuro: Alert & orientedx3, cranial nerves grossly intact. moves all 4 extremities w/o difficulty. Affect pleasant   Telemetry   NSR with occasional PVCs (personally reviewed)   Labs    CBC Recent Labs    03/20/23 0958 03/20/23 1638  WBC  --  6.2  NEUTROABS  --  3.1  HGB 13.9 13.0  HCT 41.0 40.2  MCV  --  90.7  PLT  --  229   Basic Metabolic Panel Recent Labs    40/98/11 1638 03/21/23 0432  NA 138 139  K 3.6 3.3*  CL 102 103  CO2 28 24  GLUCOSE 160* 150*  BUN 15 17  CREATININE 1.38* 1.50*  CALCIUM 9.0 8.7*  MG 2.3  --    Liver Function Tests Recent Labs    03/20/23 1638  AST 22  ALT 17  ALKPHOS 40  BILITOT 0.8  PROT 6.6  ALBUMIN 3.3*   No results for input(s): "LIPASE", "AMYLASE" in the last 72 hours. Cardiac Enzymes No results for input(s): "CKTOTAL", "CKMB", "CKMBINDEX", "TROPONINI" in the last 72 hours.  BNP: BNP (last 3 results) Recent Labs    03/07/23 1215 03/14/23 1942 03/20/23 1638  BNP 603.8* 812.0* 1,030.3*    ProBNP (last 3 results) No results for input(s): "PROBNP" in the last 8760 hours.   D-Dimer No results for input(s): "DDIMER" in the last 72 hours. Hemoglobin A1C No results for input(s): "HGBA1C" in the  last 72 hours. Fasting Lipid Panel No results for input(s): "CHOL", "HDL", "LDLCALC", "TRIG", "CHOLHDL", "LDLDIRECT" in the last 72 hours. Thyroid Function Tests Recent Labs    03/20/23 1638  TSH 9.887*    Other results:   Imaging    Port CXR Result Date: 03/20/2023 CLINICAL DATA:  Central line placement. EXAM: PORTABLE CHEST 1 VIEW COMPARISON:  Chest radiograph 03/14/2023 FINDINGS: Right internal jugular Swan-Ganz catheter tip is in the region of the right lower lobe outflow track. Recommend retraction of approximately 5 cm. No pneumothorax. The heart is mildly enlarged. Mild vascular congestion. Right infrahilar atelectasis with possible right pleural effusion. The right costophrenic  angle is not included in the field of view. IMPRESSION: 1. Right internal jugular Swan-Ganz catheter tip is in the region of the right lower lobe outflow track. Recommend retraction of approximately 5 cm. 2. Mild cardiomegaly and vascular congestion. 3. Right infrahilar atelectasis with possible right pleural effusion. Electronically Signed   By: Narda Rutherford M.D.   On: 03/20/2023 20:55   CARDIAC CATHETERIZATION Result Date: 03/20/2023 1. Normal RA pressure. 2. Borderline elevated PCWP 3. Moderate mixed pulmonary arterial/pulmonary venous hypertension.  4. Low CO by thermodilution, preserved by Fick.     Medications:     Scheduled Medications:  amiodarone  200 mg Oral BID   apixaban  5 mg Oral BID   Chlorhexidine Gluconate Cloth  6 each Topical Daily   digoxin  0.0625 mg Oral Daily   insulin aspart  0-15 Units Subcutaneous TID WC   insulin aspart  0-5 Units Subcutaneous QHS   rosuvastatin  40 mg Oral Daily   sildenafil  40 mg Oral TID   sodium chloride flush  3 mL Intravenous Q12H   sodium chloride flush  3 mL Intravenous Q12H   spironolactone  12.5 mg Oral Daily   torsemide  20 mg Oral QPM   [START ON 03/22/2023] torsemide  40 mg Oral q morning    Infusions:  sodium chloride 10 mL/hr at 03/21/23 0600   sodium chloride     sodium chloride     sodium chloride 10 mL/hr at 03/21/23 0600   sodium chloride 10 mL/hr at 03/21/23 0600   milrinone 0.25 mcg/kg/min (03/21/23 0600)    PRN Medications: sodium chloride, sodium chloride, sodium chloride, acetaminophen, ondansetron (ZOFRAN) IV, mouth rinse, sodium chloride flush, sodium chloride flush    Assessment/Plan   1. 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 Echo 2/25 with EF 25%, mildly reduced RV function. NYHA class IIIb-IV, primarily due to profound fatigue.  Low BP limits GDMT titration, SBP 80s-90s at  home.  His profound fatigue has been concerning for low output HF, RHC set up for today.  RHC 3/10 showed low CI by thermo (2.05) but preserved by Fick.  Borderline elevated PCWP, normal RA pressure.  There was also moderate mixed pulmonary arterial/pulmonary venous hypertension.  With his significant symptoms, I started him on milrinone 0.25 and added sildenafil to try to lower PA pressure and watch him with Ernestine Conrad in. This morning, PA 51/20 with CI 2.8 and co-ox 73% with RA 2 on milrinone 0.25.  - Decrease milrinone to 0.125 now, stop later today and reassess numbers off milrinone.   - Increase sildenafil to 40 tid for moderate pulmonary arterial hypertension.   - Hold am torsemide dose, will restart in pm (CVP 2).   - Continue Jardiance 10  mg daily. - Continue digoxin 0.0625 daily (use low dose with relatively small size and CKD stage 3).  - I have stopped Toprol XL with concern for low output.  - Continue spironolactone 12.5 at bedtime.   - Will defer discussion of ICD for now given concern for need for advanced therapies.  He qualifies with persistently low EF.  Narrow QRS so not CRT candidate.   - Cardiac output is low by thermo but not Fick on 3/10 RHC.  Symptomatically NYHA IV.  May be LVAD candidate, will have to see how he does with milrinone wean.  2. CAD:  Hx inferior STEMI 10/23 s/p PCI/DES to RCA. Anterior STEMI 12/10/22, cath showed occluded pLAD treated with PCI/DES, prior RCA stent patent, severe disease OM1 and distal LCx treated medically. No chest pain.  - Continue Plavix and Eliquis.  - Continue rosuvastatin 40 mg + Zetia 10 mg daily. 3. Atrial fibrillation: Paroxysmal. NSR today.  - Continue Eliquis 5 mg BID - Will increase amiodarone to 200 mg daily while he is on milrinone.  4. DM II: Continue Jardiance, add SSI in hospital.  5. 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 Not a smoker, no known lung disease. CXR has shown some  chronic interstitial changes.  - HRCT chest done, pending official read.   - Sildenafil and milrinone as above.  - Will need V/Q scan to rule out chronic PE at some point.  6. Pleural effusion: Moderate on right.  - Discussed with pulmonary, will arrange thoracentesis and will send studies.  - Hold apixaban dose this morning pending thoracentesis.   CRITICAL CARE Performed by: Marca Ancona  Total critical care time: 40 minutes  Critical care time was exclusive of separately billable procedures and treating other patients.  Critical care was necessary to treat or prevent imminent or life-threatening deterioration.  Critical care was time spent personally by me on the following activities: development of treatment plan with patient and/or surrogate as well as nursing, discussions with consultants, evaluation of patient's response to treatment, examination of patient, obtaining history from patient or surrogate, ordering and performing treatments and interventions, ordering and review of laboratory studies, ordering and review of radiographic studies, pulse oximetry and re-evaluation of patient's condition.   Length of Stay: 1  Marca Ancona, MD  03/21/2023, 6:59 AM  Advanced Heart Failure Team Pager [phone] (M-F; 7a - 5p)  Please contact [facility] for night-coverage after hours (5p -7a ) and weekends on [email or web address]

## 2023-03-21 NOTE — Procedures (Signed)
 Thoracentesis  Procedure Note  Garrett Roberts  [id number]  07-24-1955  Date:03/21/23  Time:3:17 PM   Provider Performing:Payson Evrard E  Cherlynn Polo   Procedure: Thoracentesis with imaging guidance (10272)  Indication(s) Pleural Effusion  Consent Risks of the procedure as well as the alternatives and risks of each were explained to the patient and/or caregiver.  Consent for the procedure was obtained and is signed in the bedside chart  Anesthesia Topical only with 1% lidocaine    Time Out Verified patient identification, verified procedure, site/side was marked, verified correct patient position, special equipment/implants available, medications/allergies/relevant history reviewed, required imaging and test results available.   Sterile Technique Maximal sterile technique including full sterile barrier drape, hand hygiene, sterile gown, sterile gloves, mask, hair covering, sterile ultrasound probe cover (if used).  Procedure Description Ultrasound was used to identify appropriate pleural anatomy for placement and overlying skin marked.  Area of drainage cleaned and draped in sterile fashion. Lidocaine was used to anesthetize the skin and subcutaneous tissue.  1000 cc's of yellow/red appearing fluid was drained from the right pleural space. Catheter then removed and bandaid applied to site.   Complications/Tolerance None; patient tolerated the procedure well. Chest X-ray is ordered to confirm no post-procedural complication.   EBL Minimal   Specimen(s) Pleural fluid  Garrett Peru, PA-C [facility] 03/21/23 3:18 PM  Please see [email or web address] for pager details.  From 7A-7P if no response, please call [phone] After hours, please call ELink [phone]

## 2023-03-21 NOTE — TOC Initial Note (Signed)
 Transition of Care [facility]) - Initial/Assessment Note    Patient Details  Name: Garrett Roberts MRN: [id number] Date of Birth: March 08, 1955  [facility] CM/SW Contact:    Nicanor Bake Phone Number: [phone] 03/21/2023, 4:22 PM  Clinical Narrative: HF CSW met with pt, pts daughter, and sister at bedside. Pt stated that he lives with his wife and adult children. Pt stated that he has no history of HH services. Pt stated that he does not use nay equipment. Pt stated that he has a scale at home. Pt stated that he has a PCP. CSW explained that a hospital follow up appointment is typically scheduled closer to dc. Pt agrees. Pt stated that he has Medicaid insurance and was recently approved.   TOC will continue following.                   Expected Discharge Plan: Home/Self Care Barriers to Discharge: Continued Medical Work up   Patient Goals and CMS Choice Patient states their goals for this hospitalization and ongoing recovery are:: get healthy          Expected Discharge Plan and Services       Living arrangements for the past 2 months: Single Family Home                                      Prior Living Arrangements/Services Living arrangements for the past 2 months: Single Family Home Lives with:: Spouse, Adult Children Patient language and need for interpreter reviewed:: Yes Do you feel safe going back to the place where you live?: Yes      Need for Family Participation in Patient Care: No (Comment) Care giver support system in place?: Yes (comment)   Criminal Activity/Legal Involvement Pertinent to Current Situation/Hospitalization: No - Comment as needed  Activities of Daily Living   ADL Screening (condition at time of admission) Independently performs ADLs?: Yes (appropriate for developmental age) Is the patient deaf or have difficulty hearing?: No Does the patient have difficulty seeing, even when wearing glasses/contacts?:  No Does the patient have difficulty concentrating, remembering, or making decisions?: No  Permission Sought/Granted                  Emotional Assessment Appearance:: Appears stated age Attitude/Demeanor/Rapport: Engaged Affect (typically observed): Appropriate Orientation: : Oriented to Self, Oriented to Place, Oriented to  Time, Oriented to Situation Alcohol / Substance Use: Not Applicable Psych Involvement: No (comment)  Admission diagnosis:  Acute on chronic systolic CHF (congestive heart failure) (HCC) [I50.23] Patient Active Problem List   Diagnosis Date Noted   Acute on chronic systolic CHF (congestive heart failure) (HCC) 02/17/2023   CHF (congestive heart failure) (HCC) 02/17/2023   Ischemic cardiomyopathy 12/19/2022   Chronic HFrEF (heart failure with reduced ejection fraction) (HCC) 12/19/2022   CKD (chronic kidney disease), 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   Type 2 diabetes mellitus with complication, without long-term current use of insulin (HCC) 12/15/2022   PAF (paroxysmal atrial fibrillation) (HCC) 12/14/2022   Constipation 12/12/2022   Abdominal pain 12/11/2022   Abnormal CT scan, stomach 12/11/2022   CAD (coronary artery disease) 10/11/2022   Essential hypertension 10/11/2022   Hyperlipidemia 10/11/2022   PURE HYPERCHOLESTEROLEMIA 09/23/2008   PCP:  Pcp, No Pharmacy:   [facility] [address] [place] [address] Phone: [phone] Fax: [phone]     Social  Drivers of Health (SDOH) Social History: SDOH Screenings   Food Insecurity: No Food Insecurity (03/20/2023)  Housing: Low Risk  (03/20/2023)  Transportation Needs: No Transportation Needs (03/20/2023)  Utilities: Patient Declined (03/20/2023)  Alcohol Screen: Low Risk  (12/13/2022)  Financial Resource Strain: High Risk (12/13/2022)  Physical Activity: Sufficiently Active (11/27/2019)   Received from [facility]  Social Connections: Socially  Integrated (03/20/2023)  Stress: Stress Concern Present (11/27/2019)   Received from [facility]  Tobacco Use: Low Risk  (03/15/2023)   SDOH Interventions: Food Insecurity Interventions: Intervention Not Indicated Housing Interventions: Intervention Not Indicated Transportation Interventions: Intervention Not Indicated Utilities Interventions: Intervention Not Indicated Social Connections Interventions: Intervention Not Indicated   Readmission Risk Interventions     No data to display

## 2023-03-21 NOTE — Consult Note (Signed)
 NAMEMihir Roberts, MRN:  [id number], DOB:  12-14-1955, LOS: 1 ADMISSION DATE:  03/20/2023, CONSULTATION DATE:  3/11 REFERRING MD:  Shirlee Latch, CHIEF COMPLAINT:  SOB   History of Present Illness:  68 year old male with past medical history of hypertension, hyperlipidemia, diabetes, CAD, ischemic cardiomyopathy, congestive heart failure, atrial fibrillation who presented to [facility] with dyspnea, congestive heart failure for right heart cath today with Dr. Shirlee Latch.  Had STEMI in 2023, anterior STEMI in 2024.  Had presented 2/25 with acute heart failure and was diuresed and able to be discharged home.  He has had ongoing fatigue and dyspnea since 03/14/2023 even after increasing his torsemide.  He had right heart cath on 3/11 and was admitted to CVICU post procedurally.  Pulmonology was consulted for evidence of right-sided pleural effusion on CT chest.  On my exam, older male, sitting in bed in no acute distress.  Daughter is at bedside.  He does endorse shortness of breath but states that he feels okay when ambulating.  He has 2 pillow orthopnea.  He denies this worsening.  He denies having chest pain.  He is a never smoker.  Denies any environmental or occupational exposures.  Denies feeling of aspiration or dysphagia when eating.  Denies history of GERD.  He does have about 30 pound weight loss over the past few months that is unintentional. Pertinent  Medical History  Hypertension, hyperlipidemia, diabetes, CAD, ischemic cardiomyopathy, congestive heart failure, atrial fibrillation  Significant Hospital Events: Including procedures, antibiotic start and stop dates in addition to other pertinent events   3/10: Right heart cath 3/11: CT chest with right-sided pleural effusion, s/p thoracentesis  Interim History / Subjective:  Patient overall feels okay, does have some coughing with laying flat.  Objective   Blood pressure 93/64, pulse 68, temperature 97.7 F (36.5 C), resp. rate  20, height 6' (1.829 m), weight 68.2 kg, SpO2 100%. PAP: (41-70)/(15-29) 62/24 CVP:  [0 mmHg-6 mmHg] 1 mmHg CO:  [3.6 L/min-5.2 L/min] 4.7 L/min CI:  [1.9 L/min/m2-2.82 L/min/m2] 2.5 L/min/m2      Intake/Output Summary (Last 24 hours) at 03/21/2023 1418 Last data filed at 03/21/2023 1300 Gross per 24 hour  Intake 1548.46 ml  Output 1875 ml  Net -326.54 ml   Filed Weights   03/20/23 0833 03/20/23 1634 03/21/23 0520  Weight: 65.8 kg 70.8 kg 68.2 kg    Examination: General: Older male, thin, laying in bed in no acute distress HENT: Anicteric, mucous membranes moist Lungs: Decreased lung sounds in the right lower lobe Cardiovascular: S1-S2, no murmur Abdomen: Flat and soft Extremities: No pitting edema Neuro: Alert and oriented, nonfocal GU: Deferred  Resolved Hospital Problem list    Assessment & Plan:  Right pleural effusion s/p right thoracentesis CT chest shows a moderate right-sided pleural effusion.  S/p right thoracentesis with 1000 cc drained that appeared inflammatory, amber appearing.  Studies sent.  This could be heart failure related, somewhat unusual for unilateral pleural effusion but possible.  Will rule out other etiologies like aspiration, infection, malignancy. -Follow-up chest x-ray now -Follow-up pleural fluid studies -Will place on PPI to cover for GERD related symptoms -Will also get speech involved to rule out chronic silent aspiration  Below per primary Acute on chronic systolic heart failure CAD Atrial fibrillation Type 2 diabetes Pulmonary hypertension  Best Practice (right click and "Reselect all SmartList Selections" daily)  Per primary  Labs   CBC: Recent Labs  Lab 03/14/23 1942 03/20/23 0958 03/20/23 1638  WBC  6.6  --  6.2  NEUTROABS 3.2  --  3.1  HGB 13.4 13.9 13.0  HCT 42.5 41.0 40.2  MCV 91.2  --  90.7  PLT 284  --  229    Basic Metabolic Panel: Recent Labs  Lab 03/14/23 1942 03/20/23 0958 03/20/23 1638 03/21/23 0432   NA 134* 138 138 139  K 3.5 3.6 3.6 3.3*  CL 97*  --  102 103  CO2 25  --  28 24  GLUCOSE 167*  --  160* 150*  BUN 15  --  15 17  CREATININE 1.44*  --  1.38* 1.50*  CALCIUM 9.4  --  9.0 8.7*  MG  --   --  2.3  --    GFR: Estimated Creatinine Clearance: 46.1 mL/min (A) (by C-G formula based on SCr of 1.5 mg/dL (H)). Recent Labs  Lab 03/14/23 1942 03/20/23 1638  WBC 6.6 6.2    Liver Function Tests: Recent Labs  Lab 03/20/23 1638  AST 22  ALT 17  ALKPHOS 40  BILITOT 0.8  PROT 6.6  ALBUMIN 3.3*   No results for input(s): "LIPASE", "AMYLASE" in the last 168 hours. No results for input(s): "AMMONIA" in the last 168 hours.  ABG    Component Value Date/Time   HCO3 32.2 (H) 03/20/2023 0958   TCO2 34 (H) 03/20/2023 0958   O2SAT 72.7 03/21/2023 0432     Coagulation Profile: No results for input(s): "INR", "PROTIME" in the last 168 hours.  Cardiac Enzymes: No results for input(s): "CKTOTAL", "CKMB", "CKMBINDEX", "TROPONINI" in the last 168 hours.  HbA1C: Hgb A1c MFr Bld  Date/Time Value Ref Range Status  02/16/2023 10:33 PM 7.8 (H) 4.8 - 5.6 % Final    Comment:    (NOTE) Pre diabetes:          5.7%-6.4%  Diabetes:              >6.4%  Glycemic control for   <7.0% adults with diabetes   12/10/2022 04:48 PM 11.2 (H) 4.8 - 5.6 % Final    Comment:    (NOTE) Pre diabetes:          5.7%-6.4%  Diabetes:              >6.4%  Glycemic control for   <7.0% adults with diabetes     CBG: Recent Labs  Lab 03/20/23 1550 03/20/23 1953 03/21/23 0622 03/21/23 0738 03/21/23 1213  GLUCAP 171* 132* 145* 154* 196*    Review of Systems:   As above  Past Medical History:  He,  has a past medical history of Acute ST elevation myocardial infarction (STEMI) of anterior wall (HCC) (12/10/2022), Acute ST elevation myocardial infarction (STEMI) of inferior wall (HCC) (10/30/2021), Atrial fibrillation (HCC), Atrial fibrillation with RVR (HCC) (12/19/2022), CHF (congestive  heart failure) (HCC), Coronary artery disease, Diabetes mellitus without complication (HCC), DM (diabetes mellitus) (HCC), H. pylori infection, Heart attack (HCC), HLD (hyperlipidemia), Hypertension, and ST elevation myocardial infarction involving left anterior descending (LAD) coronary artery (HCC) (12/10/2022).   Surgical History:   Past Surgical History:  Procedure Laterality Date   CARDIOVERSION N/A 12/14/2022   Procedure: CARDIOVERSION;  Surgeon: Little Ishikawa, MD;  Location: [facility] INVASIVE CV LAB;  Service: Cardiovascular;  Laterality: N/A;   CORONARY/GRAFT ACUTE MI REVASCULARIZATION N/A 10/30/2021   Procedure: Coronary/Graft Acute MI Revascularization;  Surgeon: Yates Decamp, MD;  Location: [facility] INVASIVE CV LAB;  Service: Cardiovascular;  Laterality: N/A;   CORONARY/GRAFT ACUTE MI REVASCULARIZATION N/A 12/10/2022  Procedure: Coronary/Graft Acute MI Revascularization;  Surgeon: Tonny Bollman, MD;  Location: [facility] INVASIVE CV LAB;  Service: Cardiovascular;  Laterality: N/A;   LEFT HEART CATH AND CORONARY ANGIOGRAPHY N/A 10/30/2021   Procedure: LEFT HEART CATH AND CORONARY ANGIOGRAPHY;  Surgeon: Yates Decamp, MD;  Location: [facility] INVASIVE CV LAB;  Service: Cardiovascular;  Laterality: N/A;   LEFT HEART CATH AND CORONARY ANGIOGRAPHY N/A 12/10/2022   Procedure: LEFT HEART CATH AND CORONARY ANGIOGRAPHY;  Surgeon: Tonny Bollman, MD;  Location: [facility] INVASIVE CV LAB;  Service: Cardiovascular;  Laterality: N/A;   RIGHT HEART CATH N/A 03/20/2023   Procedure: RIGHT HEART CATH;  Surgeon: Laurey Morale, MD;  Location: [facility] INVASIVE CV LAB;  Service: Cardiovascular;  Laterality: N/A;     Social History:   reports that he has never smoked. He has never used smokeless tobacco. He reports that he does not drink alcohol and does not use drugs.   Family History:  His family history includes Chronic Renal Failure in his brother and father; Diabetes in his brother, brother, father, and mother; Heart attack in  his brother; Stroke in his brother.   Allergies Allergies  Allergen Reactions   Beef-Derived Drug Products Other (See Comments)    Religious observance   Pork-Derived Products Other (See Comments)    Religious observance     Home Medications  Prior to Admission medications   Medication Sig Start Date End Date Taking? Authorizing Provider  amiodarone (PACERONE) 200 MG tablet Take 0.5 tablets (100 mg total) by mouth daily. 03/07/23  Yes Milford, Anderson Malta, FNP  Ascorbic Acid (VITAMIN C PO) Take 1 tablet by mouth daily with lunch.   Yes [provider]  clopidogrel (PLAVIX) 75 MG tablet Take 1 tablet (75 mg total) by mouth daily. 12/27/22  Yes Andrey Farmer, PA-C  Cyanocobalamin (B-12 PO) Take 1 tablet by mouth daily with lunch.   Yes [provider]  digoxin (LANOXIN) 0.125 MG tablet Take 1 tablet (0.125 mg total) by mouth daily. 03/16/23  Yes Laurey Morale, MD  empagliflozin (JARDIANCE) 10 MG TABS tablet Take 1 tablet (10 mg total) by mouth daily before breakfast. 01/16/23  Yes Gaston Islam., NP  ezetimibe (ZETIA) 10 MG tablet Take 1 tablet (10 mg total) by mouth daily. 12/27/22  Yes Andrey Farmer, PA-C  MAGNESIUM PO Take 1 tablet by mouth at bedtime.   Yes [provider]  metFORMIN (GLUCOPHAGE) 500 MG tablet Take 1 tablet (500 mg total) by mouth daily with breakfast. Courtesy fill- Additional refills will need to come from PCP 02/10/23  Yes Laurey Morale, MD  Multiple Vitamins-Minerals (ZINC PO) Take 1 tablet by mouth daily after lunch.   Yes [provider]  ondansetron (ZOFRAN-ODT) 4 MG disintegrating tablet Dissolve 1 tablet (4 mg total) in mouth every 8 (eight) hours as needed for nausea or vomiting. 03/07/23  Yes Milford, Anderson Malta, FNP  pantoprazole (PROTONIX) 40 MG tablet Take 1 tablet (40 mg total) by mouth 2 (two) times daily before a meal. 02/06/23  Yes Laurey Morale, MD  potassium chloride SA (KLOR-CON M) 20 MEQ tablet  Take 1 tablet (20 mEq total) by mouth daily. 03/09/23  Yes Milford, Anderson Malta, FNP  rosuvastatin (CRESTOR) 40 MG tablet Take 1 tablet (40 mg total) by mouth daily. 02/10/23 05/11/23 Yes Laurey Morale, MD  spironolactone (ALDACTONE) 25 MG tablet Take 0.5 tablets (12.5 mg total) by mouth at bedtime. 03/16/23  Yes Laurey Morale, MD  torsemide (DEMADEX) 20 MG tablet Take 40 mg by mouth in the morning. 1 tab in the evening   Yes [provider]  Vitamin D-Vitamin K (VITAMIN K2-VITAMIN D3 PO) Take 1 tablet by mouth daily with lunch.   Yes [provider]  apixaban (ELIQUIS) 5 MG TABS tablet Take 1 tablet (5 mg total) by mouth 2 (two) times daily. 01/16/23   Gaston Islam., NP  nitroGLYCERIN (NITROSTAT) 0.4 MG SL tablet Place 1 tablet (0.4 mg total) under the tongue every 5 (five) minutes x 3 doses as needed for chest pain. 12/15/22   Arty Baumgartner, NP     Critical care time: 68    Lenard Galloway [facility] 03/21/23 3:42 PM  Please see [email or web address] for pager details.  From 7A-7P if no response, please call [phone] After hours, please call ELink [phone]

## 2023-03-21 NOTE — Progress Notes (Signed)
 MCS EDUCATION NOTE:                VAD evaluation process reviewed with Garrett Roberts and one of his daughters. Initial VAD teaching completed with pt and caregiver.   VAD educational packet including "Understanding Your Options with Advanced Heart Failure", "[facility] Patient Agreement for VAD Evaluation and Potential Implantation" consent, and [employer] "Heartmate 3 Left Ventricular Device (LVAD) Patient Guide", "[place] HM III Patient Education", "[place] Mechanical Circulatory Support Program", and "Decision Aids for Left Ventricular Assist Device" reviewed in detail and left at bedside for continued reference.  All questions answered regarding VAD implant, hospital stay, and what to expect when discharged home living with a heart pump.   Pt states he has 5 children and his wife who could possible serve as caregivers if this therapy should be deemed appropriate. Explained need for 24/7 care when pt is discharged home due to sternal precautions, adaptation to living on support, emotional support, consistent and meticulous exit site care and management, medication adherence and high volume of follow up visits with the VAD Clinic after discharge; both pt and caregiver verbalized understanding of above.   Explained that LVAD can be implanted for two indications in the setting of advanced left ventricular heart failure treatment:  Bridge to transplant - used for patients who cannot safely wait for heart transplant without this device.  Or    Destination therapy - used for patients until end of life or recovery of heart function.  Patient and caregiver(s) acknowledge that the indication at this point in time for LVAD therapy would be for destination therapy due to age.   Provided brief equipment overview and demonstration with HeartMate III training loop including discussion on the following:   a) mobile power unit b) system controller   c) universal Magazine features editor   d) battery clips   e)  Batteries   f)  Perc lock   g) Percutaneous lead    Identified the following lifestyle modifications while living on MCS:   1. No driving for at least three months and then only if doctor gives permission to do so.   2. No tub baths while pump implanted, and shower only when doctor gives permission.   3. No swimming or submersion in water while implanted with pump.   4. No contact sports or engaging in jumping activities.   5. Always have a backup controller, charged spare batteries, and battery clips nearby at all times in case of emergency.   6. Call the doctor or hospital contact person if any change in how the pump sounds, feels, or works.   7. Plan to sleep only when connected to the power module.   8. Do not sleep on your stomach.   9. Keep a backup system controller, charged batteries, battery clips, and flashlight near you during sleep in case of electrical power outage.   10. Exit site care including dressing changes, monitoring for infection, and importance of keeping percutaneous lead stabilized at all times.    Extended the option to have one of our current patients and caregiver(s) come to talk with them about living on support to assist with decision making.   Reviewed pictures of VAD drive line, site care, dressing changes, and drive line stabilization including securement attachment device and abdominal binder. Discussed with pt and family that they will be required to purchase dressing supplies as long as patient has the VAD in place.   Intermacs patient survival statistics  through September 2024 reviewed with patient and caregiver as follows:    The patient understands that from this discussion it does not mean that they will receive the device, but that depends on an extensive evaluation process. The patient is aware of the fact that if at anytime they want to stop the evaluation process they can.  All questions have been answered at this time and contact information was  provided should they encounter any further questions.    Per Dr Shirlee Latch will continue to follow for readiness to pursue evaluation.   Total Session Time: 65 minutes  Alyce Pagan RN VAD Coordinator  Office: [phone]  24/7 Pager: [phone]

## 2023-03-22 ENCOUNTER — Inpatient Hospital Stay (HOSPITAL_COMMUNITY): Payer: Self-pay

## 2023-03-22 ENCOUNTER — Encounter (HOSPITAL_COMMUNITY): Payer: Self-pay | Admitting: Cardiology

## 2023-03-22 ENCOUNTER — Other Ambulatory Visit (HOSPITAL_COMMUNITY): Payer: Self-pay

## 2023-03-22 DIAGNOSIS — E43 Unspecified severe protein-calorie malnutrition: Secondary | ICD-10-CM | POA: Insufficient documentation

## 2023-03-22 DIAGNOSIS — I480 Paroxysmal atrial fibrillation: Secondary | ICD-10-CM

## 2023-03-22 DIAGNOSIS — Z8619 Personal history of other infectious and parasitic diseases: Secondary | ICD-10-CM

## 2023-03-22 DIAGNOSIS — I2510 Atherosclerotic heart disease of native coronary artery without angina pectoris: Secondary | ICD-10-CM

## 2023-03-22 DIAGNOSIS — R933 Abnormal findings on diagnostic imaging of other parts of digestive tract: Secondary | ICD-10-CM

## 2023-03-22 DIAGNOSIS — R630 Anorexia: Secondary | ICD-10-CM

## 2023-03-22 DIAGNOSIS — R1084 Generalized abdominal pain: Secondary | ICD-10-CM

## 2023-03-22 DIAGNOSIS — R634 Abnormal weight loss: Secondary | ICD-10-CM

## 2023-03-22 LAB — BASIC METABOLIC PANEL
Anion gap: 13 (ref 5–15)
BUN: 16 mg/dL (ref 8–23)
CO2: 22 mmol/L (ref 22–32)
Calcium: 9 mg/dL (ref 8.9–10.3)
Chloride: 100 mmol/L (ref 98–111)
Creatinine, Ser: 1.6 mg/dL — ABNORMAL HIGH (ref 0.61–1.24)
GFR, Estimated: 47 mL/min — ABNORMAL LOW (ref >60)
Glucose, Bld: 111 mg/dL — ABNORMAL HIGH (ref 70–99)
Potassium: 3.9 mmol/L (ref 3.5–5.1)
Sodium: 135 mmol/L (ref 135–145)

## 2023-03-22 LAB — GLUCOSE, CAPILLARY
Glucose-Capillary: 95 mg/dL (ref 70–99)
Glucose-Capillary: 184 mg/dL — ABNORMAL HIGH (ref 70–99)
Glucose-Capillary: 282 mg/dL — ABNORMAL HIGH (ref 70–99)
Glucose-Capillary: 117 mg/dL — ABNORMAL HIGH (ref 70–99)

## 2023-03-22 LAB — HEPATIC FUNCTION PANEL
ALT: 18 U/L (ref 0–44)
AST: 27 U/L (ref 15–41)
Albumin: 3.4 g/dL — ABNORMAL LOW (ref 3.5–5.0)
Alkaline Phosphatase: 40 U/L (ref 38–126)
Bilirubin, Direct: 0.2 mg/dL (ref 0.0–0.2)
Indirect Bilirubin: 0.8 mg/dL (ref 0.3–0.9)
Total Bilirubin: 1 mg/dL (ref 0.0–1.2)
Total Protein: 7 g/dL (ref 6.5–8.1)

## 2023-03-22 LAB — LACTATE DEHYDROGENASE: LDH: 161 U/L (ref 98–192)

## 2023-03-22 LAB — COOXEMETRY PANEL
Carboxyhemoglobin: 1.4 % (ref 0.5–1.5)
Methemoglobin: <0.7 % (ref 0.0–1.5)
O2 Saturation: 76.3 %
Total hemoglobin: 13.8 g/dL (ref 12.0–16.0)

## 2023-03-22 LAB — TRIGLYCERIDES, BODY FLUIDS: Triglycerides, Fluid: <9 mg/dL

## 2023-03-22 MED ORDER — TECHNETIUM TO 99M ALBUMIN AGGREGATED
3.8000 | Freq: Once | INTRAVENOUS | Status: AC | PRN
  Administered 2023-03-22: 3.8 via INTRAVENOUS

## 2023-03-22 MED ORDER — EZETIMIBE 10 MG PO TABS
10.0000 mg | ORAL_TABLET | Freq: Every day | ORAL | Status: DC
  Administered 2023-03-22 – 2023-03-24 (×3): 10 mg via ORAL
  Filled 2023-03-22 (×3): qty 1

## 2023-03-22 MED ORDER — AMIODARONE HCL 200 MG PO TABS
200.0000 mg | ORAL_TABLET | Freq: Every day | ORAL | Status: DC
  Administered 2023-03-22 – 2023-03-24 (×3): 200 mg via ORAL
  Filled 2023-03-22 (×3): qty 1

## 2023-03-22 MED ORDER — PANTOPRAZOLE SODIUM 40 MG PO TBEC
40.0000 mg | DELAYED_RELEASE_TABLET | Freq: Two times a day (BID) | ORAL | Status: DC
  Administered 2023-03-22 – 2023-03-24 (×4): 40 mg via ORAL
  Filled 2023-03-22 (×4): qty 1

## 2023-03-22 MED ORDER — POLYETHYLENE GLYCOL 3350 17 G PO PACK
17.0000 g | PACK | Freq: Two times a day (BID) | ORAL | Status: DC
  Administered 2023-03-22 – 2023-03-23 (×3): 17 g via ORAL
  Filled 2023-03-22 (×4): qty 1

## 2023-03-22 MED ORDER — ENSURE ENLIVE PO LIQD
237.0000 mL | Freq: Three times a day (TID) | ORAL | Status: DC
  Administered 2023-03-22 – 2023-03-24 (×5): 237 mL via ORAL

## 2023-03-22 MED ORDER — CLOPIDOGREL BISULFATE 75 MG PO TABS
75.0000 mg | ORAL_TABLET | Freq: Every day | ORAL | Status: DC
  Administered 2023-03-22 – 2023-03-24 (×3): 75 mg via ORAL
  Filled 2023-03-22 (×3): qty 1

## 2023-03-22 MED ORDER — POTASSIUM CHLORIDE CRYS ER 20 MEQ PO TBCR
20.0000 meq | EXTENDED_RELEASE_TABLET | Freq: Once | ORAL | Status: AC
  Administered 2023-03-22: 20 meq via ORAL
  Filled 2023-03-22: qty 1

## 2023-03-22 MED ORDER — ADULT MULTIVITAMIN W/MINERALS CH
1.0000 | ORAL_TABLET | Freq: Every day | ORAL | Status: DC
  Administered 2023-03-22 – 2023-03-24 (×3): 1 via ORAL
  Filled 2023-03-22 (×3): qty 1

## 2023-03-22 MED ORDER — EMPAGLIFLOZIN 10 MG PO TABS
10.0000 mg | ORAL_TABLET | Freq: Every day | ORAL | Status: DC
  Administered 2023-03-22 – 2023-03-24 (×3): 10 mg via ORAL
  Filled 2023-03-22 (×3): qty 1

## 2023-03-22 MED ORDER — TORSEMIDE 20 MG PO TABS
20.0000 mg | ORAL_TABLET | Freq: Every morning | ORAL | Status: DC
  Administered 2023-03-23 – 2023-03-24 (×2): 20 mg via ORAL
  Filled 2023-03-22 (×2): qty 1

## 2023-03-22 MED ORDER — TORSEMIDE 20 MG PO TABS: 20.0000 mg | ORAL_TABLET | Freq: Every morning | ORAL | Status: DC

## 2023-03-22 NOTE — Plan of Care (Signed)
  Problem: Activity: Goal: Ability to return to baseline activity level will improve Outcome: Progressing   Problem: Cardiovascular: Goal: Ability to achieve and maintain adequate cardiovascular perfusion will improve Outcome: Progressing   Problem: Education: Goal: Knowledge of General Education information will improve Description: Including pain rating scale, medication(s)/side effects and non-pharmacologic comfort measures Outcome: Progressing   Problem: Clinical Measurements: Goal: Ability to maintain clinical measurements within normal limits will improve Outcome: Progressing Goal: Diagnostic test results will improve Outcome: Progressing Goal: Respiratory complications will improve Outcome: Progressing   Problem: Elimination: Goal: Will not experience complications related to urinary retention Outcome: Progressing   Problem: Pain Managment: Goal: General experience of comfort will improve and/or be controlled Outcome: Progressing   Problem: Skin Integrity: Goal: Risk for impaired skin integrity will decrease Outcome: Progressing   Problem: Fluid Volume: Goal: Ability to maintain a balanced intake and output will improve Outcome: Progressing

## 2023-03-22 NOTE — Progress Notes (Signed)
 03/22/2023 Agree with GI consult to consider EGD; f/u MBSS.  Available PRN  Myrla Halsted MD PCCM

## 2023-03-22 NOTE — Plan of Care (Signed)
  Problem: Education: Goal: Understanding of CV disease, CV risk reduction, and recovery process will improve Outcome: Progressing   Problem: Activity: Goal: Ability to return to baseline activity level will improve Outcome: Progressing   Problem: Cardiovascular: Goal: Ability to achieve and maintain adequate cardiovascular perfusion will improve Outcome: Progressing   Problem: Health Behavior/Discharge Planning: Goal: Ability to safely manage health-related needs after discharge will improve Outcome: Progressing   Problem: Education: Goal: Knowledge of General Education information will improve Description: Including pain rating scale, medication(s)/side effects and non-pharmacologic comfort measures Outcome: Progressing   Problem: Health Behavior/Discharge Planning: Goal: Ability to manage health-related needs will improve Outcome: Progressing   Problem: Clinical Measurements: Goal: Will remain free from infection Outcome: Progressing   Problem: Coping: Goal: Level of anxiety will decrease Outcome: Progressing   Problem: Elimination: Goal: Will not experience complications related to urinary retention Outcome: Progressing   Problem: Pain Managment: Goal: General experience of comfort will improve and/or be controlled Outcome: Progressing   Problem: Elimination: Goal: Will not experience complications related to bowel motility Outcome: Not Progressing

## 2023-03-22 NOTE — H&P (View-Only) (Signed)
 Consultation  Referring Provider: Cardiology / Jearld Pies Primary Care Physician:  Pcp, No Primary Gastroenterologist:  Dr.Dorsey  Reason for Consultation: Abnormal stomach on CT  HPI: Garrett Roberts is a 68 y.o. male, who was admitted 2 days ago with congestive heart failure.  He has history of coronary artery disease, status post NSTEMI October 2023 requiring PCI/DES to RCA.  EF was 40 to 45% at that time.  Also with history of diabetes mellitus, hypertension. Patient apparently lost insurance in 2024 and was not taking medications.  He presented with another ST EMI in November 2024 and cardiac cath at that time showed 100% proximal LAD treated with PCI and DES.  Echo at that time with EF of 25% no LV thrombus and moderately reduced RV function.  Also developed A-fib with RVR and had cardioversion. He was also seen by GI around that time due to complaints of abdominal pain, did not feel that he was appropriate to have endoscopic evaluation at that time.  He was treated empirically with twice daily PPI, given MiraLAX to use as needed for constipation, and was to be followed up outpatient.  He was checked for H. pylori which was positive And completed a course of treatment for that. He says he is continue to have some left lower quadrant discomfort which seems to be related to constipation and relieved by bowel movements.  He has had prior colonoscopy about 7 years ago and was told that was negative here in [place].  No prior EGD.  Consideration for colonoscopy and EGD was given for once he was further out from his MI.Marland Kitchen Further discussion with patient he says he really has not had much of an appetite for the past 3 to 4 months at least and has lost about 30 pounds.  He gets nauseated if he tries to force himself to eat and feels as if he will vomit.  Has had some mild epigastric discomfort intermittently.  No heartburn or indigestion, believes he has still been taking Protonix at home.  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  He has been started on milrinone and sildenafil. Has been discussion regarding potential LVAD versus ICD.  He has been undergoing further workup. 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  CT abdomen pelvis also done this admission shows trace bilateral effusions now left greater than right no focal hepatic abnormality, unremarkable pancreas, there is a questionable gastric wall thickening at the level of the greater curved unchanged versus underdistention and large amount of stool in the colon.  Nuc med perfusion scan done today pending Modified swallow study with speech path today shows functional oropharyngeal swallow with no aspiration or penetration, no obvious barium retention  in the esophagus and 13 mm tablet traversed without difficulty  Past Medical History:  Diagnosis Date   Acute ST elevation myocardial infarction (STEMI) of anterior wall (HCC) 12/10/2022   Acute ST elevation myocardial infarction (STEMI) of inferior wall (HCC) 10/30/2021   Atrial fibrillation (HCC)    Atrial fibrillation with RVR (HCC) 12/19/2022  CHF (congestive heart failure) (HCC)    Coronary artery disease    Diabetes mellitus without complication (HCC)    DM (diabetes mellitus) (HCC)    H. pylori infection    Heart attack (HCC)    HLD (hyperlipidemia)    Hypertension    ST elevation myocardial infarction involving left anterior descending (LAD) coronary artery (HCC) 12/10/2022    Past Surgical  History:  Procedure Laterality Date   CARDIOVERSION N/A 12/14/2022   Procedure: CARDIOVERSION;  Surgeon: Little Ishikawa, MD;  Location: [facility] INVASIVE CV LAB;  Service: Cardiovascular;  Laterality: N/A;   CORONARY/GRAFT ACUTE MI REVASCULARIZATION N/A 10/30/2021   Procedure: Coronary/Graft Acute MI Revascularization;  Surgeon: Yates Decamp, MD;  Location: [facility] INVASIVE CV LAB;  Service: Cardiovascular;  Laterality: N/A;   CORONARY/GRAFT ACUTE MI REVASCULARIZATION N/A 12/10/2022   Procedure: Coronary/Graft Acute MI Revascularization;  Surgeon: Tonny Bollman, MD;  Location[facility] INVASIVE CV LAB;  Service: Cardiovascular;  Laterality: N/A;   LEFT HEART CATH AND CORONARY ANGIOGRAPHY N/A 10/30/2021   Procedure: LEFT HEART CATH AND CORONARY ANGIOGRAPHY;  Surgeon: Yates Decamp, MD;  Location: [facility] INVASIVE CV LAB;  Service: Cardiovascular;  Laterality: N/A;   LEFT HEART CATH AND CORONARY ANGIOGRAPHY N/A 12/10/2022   Procedure: LEFT HEART CATH AND CORONARY ANGIOGRAPHY;  Surgeon: Tonny Bollman, MD;  Location: [facility] INVASIVE CV LAB;  Service: Cardiovascular;  Laterality: N/A;   RIGHT HEART CATH N/A 03/20/2023   Procedure: RIGHT HEART CATH;  Surgeon: Laurey Morale, MD;  Location: [facility] INVASIVE CV LAB;  Service: Cardiovascular;  Laterality: N/A;    Prior to Admission medications   Medication Sig Start Date End Date Taking? Authorizing Provider  amiodarone (PACERONE) 200 MG tablet Take 0.5 tablets (100 mg total) by mouth daily. 03/07/23  Yes Milford, Anderson Malta, FNP  Ascorbic Acid (VITAMIN C PO) Take 1 tablet by mouth daily with lunch.   Yes [provider]  clopidogrel (PLAVIX) 75 MG tablet Take 1 tablet (75 mg total) by mouth daily. 12/27/22  Yes Andrey Farmer, PA-C  Cyanocobalamin (B-12 PO) Take 1 tablet by mouth daily with lunch.   Yes [provider]  digoxin (LANOXIN) 0.125 MG tablet Take 1 tablet (0.125 mg total) by mouth daily. 03/16/23  Yes Laurey Morale, MD  empagliflozin  (JARDIANCE) 10 MG TABS tablet Take 1 tablet (10 mg total) by mouth daily before breakfast. 01/16/23  Yes Gaston Islam., NP  ezetimibe (ZETIA) 10 MG tablet Take 1 tablet (10 mg total) by mouth daily. 12/27/22  Yes Andrey Farmer, PA-C  MAGNESIUM PO Take 1 tablet by mouth at bedtime.   Yes [provider]  metFORMIN (GLUCOPHAGE) 500 MG tablet Take 1 tablet (500 mg total) by mouth daily with breakfast. Courtesy fill- Additional refills will need to come from PCP 02/10/23  Yes Laurey Morale, MD  Multiple Vitamins-Minerals (ZINC PO) Take 1 tablet by mouth daily after lunch.   Yes [provider]  ondansetron (ZOFRAN-ODT) 4 MG disintegrating tablet Dissolve 1 tablet (4 mg total) in mouth every 8 (eight) hours as needed for nausea or vomiting. 03/07/23  Yes Milford, Anderson Malta, FNP  pantoprazole (PROTONIX) 40 MG tablet Take 1 tablet (40 mg total) by mouth 2 (two) times daily before a meal. 02/06/23  Yes Laurey Morale, MD  potassium chloride SA (KLOR-CON M) 20 MEQ tablet Take 1 tablet (20 mEq total) by mouth daily. 03/09/23  Yes Milford, Anderson Malta, FNP  rosuvastatin (CRESTOR) 40 MG tablet Take  1 tablet (40 mg total) by mouth daily. 02/10/23 05/11/23 Yes Laurey Morale, MD  spironolactone (ALDACTONE) 25 MG tablet Take 0.5 tablets (12.5 mg total) by mouth at bedtime. 03/16/23  Yes Laurey Morale, MD  torsemide (DEMADEX) 20 MG tablet Take 40 mg by mouth in the morning. 1 tab in the evening   Yes [provider]  Vitamin D-Vitamin K (VITAMIN K2-VITAMIN D3 PO) Take 1 tablet by mouth daily with lunch.   Yes [provider]  apixaban (ELIQUIS) 5 MG TABS tablet Take 1 tablet (5 mg total) by mouth 2 (two) times daily. 01/16/23   Gaston Islam., NP  nitroGLYCERIN (NITROSTAT) 0.4 MG SL tablet Place 1 tablet (0.4 mg total) under the tongue every 5 (five) minutes x 3 doses as needed for chest pain. 12/15/22   Arty Baumgartner, NP    Current Facility-Administered  Medications  Medication Dose Route Frequency Provider Last Rate Last Admin   0.9 %  sodium chloride infusion (Manually program via Guardrails IV Fluids)   Intravenous Continuous Clegg, Thurmond Hildebran D, NP 10 mL/hr at 03/22/23 0700 Infusion Verify at 03/22/23 0700   0.9 %  sodium chloride infusion (Manually program via Guardrails IV Fluids)   Intravenous PRN Clegg, Asani Mcburney D, NP       acetaminophen (TYLENOL) tablet 650 mg  650 mg Oral Q4H PRN Laurey Morale, MD   650 mg at 03/21/23 7829   amiodarone (PACERONE) tablet 200 mg  200 mg Oral Daily Laurey Morale, MD   200 mg at 03/22/23 1049   apixaban (ELIQUIS) tablet 5 mg  5 mg Oral BID Laurey Morale, MD   5 mg at 03/22/23 1049   Chlorhexidine Gluconate Cloth 2 % PADS 6 each  6 each Topical Daily Clegg, Prentiss Hammett D, NP   6 each at 03/22/23 1100   clopidogrel (PLAVIX) tablet 75 mg  75 mg Oral Daily Laurey Morale, MD   75 mg at 03/22/23 1049   digoxin (LANOXIN) tablet 0.0625 mg  0.0625 mg Oral Daily Laurey Morale, MD   0.0625 mg at 03/22/23 1047   empagliflozin (JARDIANCE) tablet 10 mg  10 mg Oral Daily Laurey Morale, MD   10 mg at 03/22/23 1048   ezetimibe (ZETIA) tablet 10 mg  10 mg Oral Daily Laurey Morale, MD   10 mg at 03/22/23 1044   insulin aspart (novoLOG) injection 0-15 Units  0-15 Units Subcutaneous TID WC Laurey Morale, MD   2 Units at 03/21/23 1651   insulin aspart (novoLOG) injection 0-5 Units  0-5 Units Subcutaneous QHS Laurey Morale, MD       ondansetron [facility]) injection 4 mg  4 mg Intravenous Q6H PRN Laurey Morale, MD       Oral care mouth rinse  15 mL Mouth Rinse PRN Laurey Morale, MD       pantoprazole (PROTONIX) EC tablet 40 mg  40 mg Oral BID Laurey Morale, MD   40 mg at 03/22/23 1049   polyethylene glycol (MIRALAX / GLYCOLAX) packet 17 g  17 g Oral BID Laurey Morale, MD   17 g at 03/22/23 1043   rosuvastatin (CRESTOR) tablet 40 mg  40 mg Oral Daily Laurey Morale, MD   40 mg at 03/22/23 1047   sildenafil  (REVATIO) tablet 40 mg  40 mg Oral TID Laurey Morale, MD   40 mg at 03/22/23 1044   sodium chloride flush (NS)  0.9 % injection 3 mL  3 mL Intravenous Q12H Laurey Morale, MD   3 mL at 03/22/23 1100   sodium chloride flush (NS) 0.9 % injection 3 mL  3 mL Intravenous PRN Laurey Morale, MD       sodium chloride flush (NS) 0.9 % injection 3 mL  3 mL Intravenous Q12H Laurey Morale, MD   3 mL at 03/22/23 1100   sodium chloride flush (NS) 0.9 % injection 3 mL  3 mL Intravenous PRN Laurey Morale, MD       spironolactone (ALDACTONE) tablet 12.5 mg  12.5 mg Oral Daily Laurey Morale, MD   12.5 mg at 03/22/23 1049   [START ON 03/23/2023] torsemide (DEMADEX) tablet 20 mg  20 mg Oral q morning Laurey Morale, MD        Allergies as of 03/16/2023 - Review Complete 03/16/2023  Allergen Reaction Noted   Beef-derived drug products Other (See Comments) 12/15/2022   Pork-derived products Other (See Comments) 12/15/2022    Family History  Problem Relation Age of Onset   Diabetes Mother    Chronic Renal Failure Father    Diabetes Father    Chronic Renal Failure Brother    Diabetes Brother    Diabetes Brother    Heart attack Brother    Stroke Brother     Social History   Socioeconomic History   Marital status: Married    Spouse name: Amani   Number of children: 5   Years of education: Not on file   Highest education level: High school graduate  Occupational History   Occupation: part time  Tobacco Use   Smoking status: Never   Smokeless tobacco: Never  Vaping Use   Vaping status: Never Used  Substance and Sexual Activity   Alcohol use: No   Drug use: No   Sexual activity: Not on file  Other Topics Concern   Not on file  Social History Narrative   ** Merged History Encounter **       Social Drivers of Health   Financial Resource Strain: High Risk (12/13/2022)   Overall Financial Resource Strain (CARDIA)    Difficulty of Paying Living Expenses: Very hard  Food  Insecurity: No Food Insecurity (03/20/2023)   Hunger Vital Sign    Worried About Running Out of Food in the Last Year: Never true    Ran Out of Food in the Last Year: Never true  Transportation Needs: No Transportation Needs (03/20/2023)   PRAPARE - Administrator, Civil Service (Medical): No    Lack of Transportation (Non-Medical): No  Physical Activity: Sufficiently Active (11/27/2019)   Received from [facility]   Exercise Vital Sign    Days of Exercise per Week: 4 days    Minutes of Exercise per Session: 60 min  Stress: Stress Concern Present (11/27/2019)   Received from [facility], [facility] of Occupational Health - Occupational Stress Questionnaire    Feeling of Stress : To some extent  Social Connections: Socially Integrated (03/20/2023)   Social Connection and Isolation Panel [NHANES]    Frequency of Communication with Friends and Family: More than three times a week    Frequency of Social Gatherings with Friends and Family: More than three times a week    Attends Religious Services: More than 4 times per year    Active Member of [employer] or Organizations: Yes    Attends Banker Meetings:  More than 4 times per year    Marital Status: Married  Intimate Partner Violence: Patient Unable To Answer (03/20/2023)   Humiliation, Afraid, Rape, and Kick questionnaire    Fear of Current or Ex-Partner: Patient unable to answer    Emotionally Abused: Patient unable to answer    Physically Abused: Patient unable to answer    Sexually Abused: Patient unable to answer    Review of Systems: Pertinent positive and negative review of systems were noted in the above HPI section.  All other review of systems was otherwise negative.   Physical Exam: Vital signs in last 24 hours: Temp:  [97.7 F (36.5 C)-100 F (37.8 C)] 99.1 F (37.3 C) (03/12 0600) Pulse Rate:  [68-78] 71 (03/12 0600) Resp:  [9-25] 13 (03/12 0600) BP:  (87-107)/(54-69) 91/60 (03/12 0600) SpO2:  [94 %-100 %] 100 % (03/12 0600) Weight:  [67.4 kg] 67.4 kg (03/12 0500) Last BM Date : 03/19/23 General:   Alert,  Well-developed, thin older male, pleasant and cooperative in NAD, sitting up in chair Head:  Normocephalic and atraumatic. Eyes:  Sclera clear, no icterus.   Conjunctiva pink. Ears:  Normal auditory acuity. Nose:  No deformity, discharge,  or lesions. Mouth:  No deformity or lesions.   Neck:  Supple; no masses or thyromegaly. Lungs:  Clear throughout to auscultation, decreased breath sounds bilateral bases right greater than left  Heart:  Regular rate and rhythm; no murmurs, clicks, rubs,  or gallops. Abdomen:  Soft,nontender, BS active,nonpalp mass or hsm.   Rectal: Not done today Msk:  Symmetrical without gross deformities. . Pulses:  Normal pulses noted. Extremities:  Without clubbing or edema. Neurologic:  Alert and  oriented x4;  grossly normal neurologically. Skin:  Intact without significant lesions or rashes.. Psych:  Alert and cooperative. Normal mood and affect.  Intake/Output from previous day: 03/11 0701 - 03/12 0700 In: 1709.8 [P.O.:1196; I.V.:513.8] Out: 2025 [Urine:2025] Intake/Output this shift: No intake/output data recorded.  Lab Results: Recent Labs    03/20/23 0958 03/20/23 1638  WBC  --  6.2  HGB 13.9 13.0  HCT 41.0 40.2  PLT  --  229   BMET Recent Labs    03/20/23 1638 03/21/23 0432 03/22/23 0523  NA 138 139 135  K 3.6 3.3* 3.9  CL 102 103 100  CO2 28 24 22   GLUCOSE 160* 150* 111*  BUN 15 17 16   CREATININE 1.38* 1.50* 1.60*  CALCIUM 9.0 8.7* 9.0   LFT Recent Labs    03/20/23 1638  PROT 6.6  ALBUMIN 3.3*  AST 22  ALT 17  ALKPHOS 40  BILITOT 0.8   PT/INR No results for input(s): "LABPROT", "INR" in the last 72 hours. Hepatitis Panel No results for input(s): "HEPBSAG", "HCVAB", "HEPAIGM", "HEPBIGM" in the last 72 hours.    IMPRESSION:  #61 68 year old male with acute on  chronic congestive heart failure with severe ischemic cardiomyopathy.  Current EF about 25%. 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  Now on milrinone and sildenafil. On admission there was going to be consideration for LVAD but at this time with his symptoms of fatigue and weight loss further workup felt indicated and holding off on LVAD.  2 coronary artery disease status post MI October 2023, second MI November 2024 at that time requiring LAD stent. On Plavix and Eliquis  #3 atrial fibrillation paroxysmal 4.  Diabetes mellitus 5.  Pulmonary hypertension 6.  Pleural effusion right greater than left status  post thoracentesis, symptomatically improved postthoracentesis, cytology is pending #7 modified swallowing study today with no evidence for any obvious aspiration #8 abnormal CT with possible gastric thickening along the greater curvature Patient was recently treated for H. pylori, completed therapy and has been on daily PPI Unclear if this changes related to underdistention or possible lesion.  Plan; from GI perspective continue daily PPI We can plan for EGD with Dr. Tomasa Rand with patient on Eliquis and Plavix as anticipate cardiology will not want him to stop either of these at present. Will discuss timing with cardiology, if he is felt stable from a cardiopulmonary perspective this may be able to be done tomorrow. Discussed EGD in detail with the patient including indications risk benefits and he is agreeable to proceed.  He is a high risk candidate for sedation given his EF of 20 to 25%    Suriyah Vergara EsterwoodPA-C  03/22/2023, 11:48 AM

## 2023-03-22 NOTE — Consult Note (Signed)
 Consultation  Referring Provider: Cardiology / Jearld Pies Primary Care Physician:  Pcp, No Primary Gastroenterologist:  Dr.Dorsey  Reason for Consultation: Abnormal stomach on CT  HPI: Garrett Roberts is a 68 y.o. male, who was admitted 2 days ago with congestive heart failure.  He has history of coronary artery disease, status post NSTEMI October 2023 requiring PCI/DES to RCA.  EF was 40 to 45% at that time.  Also with history of diabetes mellitus, hypertension. Patient apparently lost insurance in 2024 and was not taking medications.  He presented with another ST EMI in November 2024 and cardiac cath at that time showed 100% proximal LAD treated with PCI and DES.  Echo at that time with EF of 25% no LV thrombus and moderately reduced RV function.  Also developed A-fib with RVR and had cardioversion. He was also seen by GI around that time due to complaints of abdominal pain, did not feel that he was appropriate to have endoscopic evaluation at that time.  He was treated empirically with twice daily PPI, given MiraLAX to use as needed for constipation, and was to be followed up outpatient.  He was checked for H. pylori which was positive And completed a course of treatment for that. He says he is continue to have some left lower quadrant discomfort which seems to be related to constipation and relieved by bowel movements.  He has had prior colonoscopy about 7 years ago and was told that was negative here in [place].  No prior EGD.  Consideration for colonoscopy and EGD was given for once he was further out from his MI.Marland Kitchen Further discussion with patient he says he really has not had much of an appetite for the past 3 to 4 months at least and has lost about 30 pounds.  He gets nauseated if he tries to force himself to eat and feels as if he will vomit.  Has had some mild epigastric discomfort intermittently.  No heartburn or indigestion, believes he has still been taking Protonix at home.  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  He has been started on milrinone and sildenafil. Has been discussion regarding potential LVAD versus ICD.  He has been undergoing further workup. 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  CT abdomen pelvis also done this admission shows trace bilateral effusions now left greater than right no focal hepatic abnormality, unremarkable pancreas, there is a questionable gastric wall thickening at the level of the greater curved unchanged versus underdistention and large amount of stool in the colon.  Nuc med perfusion scan done today pending Modified swallow study with speech path today shows functional oropharyngeal swallow with no aspiration or penetration, no obvious barium retention  in the esophagus and 13 mm tablet traversed without difficulty  Past Medical History:  Diagnosis Date   Acute ST elevation myocardial infarction (STEMI) of anterior wall (HCC) 12/10/2022   Acute ST elevation myocardial infarction (STEMI) of inferior wall (HCC) 10/30/2021   Atrial fibrillation (HCC)    Atrial fibrillation with RVR (HCC) 12/19/2022  CHF (congestive heart failure) (HCC)    Coronary artery disease    Diabetes mellitus without complication (HCC)    DM (diabetes mellitus) (HCC)    H. pylori infection    Heart attack (HCC)    HLD (hyperlipidemia)    Hypertension    ST elevation myocardial infarction involving left anterior descending (LAD) coronary artery (HCC) 12/10/2022    Past Surgical  History:  Procedure Laterality Date   CARDIOVERSION N/A 12/14/2022   Procedure: CARDIOVERSION;  Surgeon: Little Ishikawa, MD;  Location: [facility] INVASIVE CV LAB;  Service: Cardiovascular;  Laterality: N/A;   CORONARY/GRAFT ACUTE MI REVASCULARIZATION N/A 10/30/2021   Procedure: Coronary/Graft Acute MI Revascularization;  Surgeon: Yates Decamp, MD;  Location: [facility] INVASIVE CV LAB;  Service: Cardiovascular;  Laterality: N/A;   CORONARY/GRAFT ACUTE MI REVASCULARIZATION N/A 12/10/2022   Procedure: Coronary/Graft Acute MI Revascularization;  Surgeon: Tonny Bollman, MD;  Location[facility] INVASIVE CV LAB;  Service: Cardiovascular;  Laterality: N/A;   LEFT HEART CATH AND CORONARY ANGIOGRAPHY N/A 10/30/2021   Procedure: LEFT HEART CATH AND CORONARY ANGIOGRAPHY;  Surgeon: Yates Decamp, MD;  Location: [facility] INVASIVE CV LAB;  Service: Cardiovascular;  Laterality: N/A;   LEFT HEART CATH AND CORONARY ANGIOGRAPHY N/A 12/10/2022   Procedure: LEFT HEART CATH AND CORONARY ANGIOGRAPHY;  Surgeon: Tonny Bollman, MD;  Location: [facility] INVASIVE CV LAB;  Service: Cardiovascular;  Laterality: N/A;   RIGHT HEART CATH N/A 03/20/2023   Procedure: RIGHT HEART CATH;  Surgeon: Laurey Morale, MD;  Location: [facility] INVASIVE CV LAB;  Service: Cardiovascular;  Laterality: N/A;    Prior to Admission medications   Medication Sig Start Date End Date Taking? Authorizing Provider  amiodarone (PACERONE) 200 MG tablet Take 0.5 tablets (100 mg total) by mouth daily. 03/07/23  Yes Milford, Anderson Malta, FNP  Ascorbic Acid (VITAMIN C PO) Take 1 tablet by mouth daily with lunch.   Yes [provider]  clopidogrel (PLAVIX) 75 MG tablet Take 1 tablet (75 mg total) by mouth daily. 12/27/22  Yes Andrey Farmer, PA-C  Cyanocobalamin (B-12 PO) Take 1 tablet by mouth daily with lunch.   Yes [provider]  digoxin (LANOXIN) 0.125 MG tablet Take 1 tablet (0.125 mg total) by mouth daily. 03/16/23  Yes Laurey Morale, MD  empagliflozin  (JARDIANCE) 10 MG TABS tablet Take 1 tablet (10 mg total) by mouth daily before breakfast. 01/16/23  Yes Gaston Islam., NP  ezetimibe (ZETIA) 10 MG tablet Take 1 tablet (10 mg total) by mouth daily. 12/27/22  Yes Andrey Farmer, PA-C  MAGNESIUM PO Take 1 tablet by mouth at bedtime.   Yes [provider]  metFORMIN (GLUCOPHAGE) 500 MG tablet Take 1 tablet (500 mg total) by mouth daily with breakfast. Courtesy fill- Additional refills will need to come from PCP 02/10/23  Yes Laurey Morale, MD  Multiple Vitamins-Minerals (ZINC PO) Take 1 tablet by mouth daily after lunch.   Yes [provider]  ondansetron (ZOFRAN-ODT) 4 MG disintegrating tablet Dissolve 1 tablet (4 mg total) in mouth every 8 (eight) hours as needed for nausea or vomiting. 03/07/23  Yes Milford, Anderson Malta, FNP  pantoprazole (PROTONIX) 40 MG tablet Take 1 tablet (40 mg total) by mouth 2 (two) times daily before a meal. 02/06/23  Yes Laurey Morale, MD  potassium chloride SA (KLOR-CON M) 20 MEQ tablet Take 1 tablet (20 mEq total) by mouth daily. 03/09/23  Yes Milford, Anderson Malta, FNP  rosuvastatin (CRESTOR) 40 MG tablet Take  1 tablet (40 mg total) by mouth daily. 02/10/23 05/11/23 Yes Laurey Morale, MD  spironolactone (ALDACTONE) 25 MG tablet Take 0.5 tablets (12.5 mg total) by mouth at bedtime. 03/16/23  Yes Laurey Morale, MD  torsemide (DEMADEX) 20 MG tablet Take 40 mg by mouth in the morning. 1 tab in the evening   Yes [provider]  Vitamin D-Vitamin K (VITAMIN K2-VITAMIN D3 PO) Take 1 tablet by mouth daily with lunch.   Yes [provider]  apixaban (ELIQUIS) 5 MG TABS tablet Take 1 tablet (5 mg total) by mouth 2 (two) times daily. 01/16/23   Gaston Islam., NP  nitroGLYCERIN (NITROSTAT) 0.4 MG SL tablet Place 1 tablet (0.4 mg total) under the tongue every 5 (five) minutes x 3 doses as needed for chest pain. 12/15/22   Arty Baumgartner, NP    Current Facility-Administered  Medications  Medication Dose Route Frequency Provider Last Rate Last Admin   0.9 %  sodium chloride infusion (Manually program via Guardrails IV Fluids)   Intravenous Continuous Clegg, Thurmond Hildebran D, NP 10 mL/hr at 03/22/23 0700 Infusion Verify at 03/22/23 0700   0.9 %  sodium chloride infusion (Manually program via Guardrails IV Fluids)   Intravenous PRN Clegg, Asani Mcburney D, NP       acetaminophen (TYLENOL) tablet 650 mg  650 mg Oral Q4H PRN Laurey Morale, MD   650 mg at 03/21/23 7829   amiodarone (PACERONE) tablet 200 mg  200 mg Oral Daily Laurey Morale, MD   200 mg at 03/22/23 1049   apixaban (ELIQUIS) tablet 5 mg  5 mg Oral BID Laurey Morale, MD   5 mg at 03/22/23 1049   Chlorhexidine Gluconate Cloth 2 % PADS 6 each  6 each Topical Daily Clegg, Prentiss Hammett D, NP   6 each at 03/22/23 1100   clopidogrel (PLAVIX) tablet 75 mg  75 mg Oral Daily Laurey Morale, MD   75 mg at 03/22/23 1049   digoxin (LANOXIN) tablet 0.0625 mg  0.0625 mg Oral Daily Laurey Morale, MD   0.0625 mg at 03/22/23 1047   empagliflozin (JARDIANCE) tablet 10 mg  10 mg Oral Daily Laurey Morale, MD   10 mg at 03/22/23 1048   ezetimibe (ZETIA) tablet 10 mg  10 mg Oral Daily Laurey Morale, MD   10 mg at 03/22/23 1044   insulin aspart (novoLOG) injection 0-15 Units  0-15 Units Subcutaneous TID WC Laurey Morale, MD   2 Units at 03/21/23 1651   insulin aspart (novoLOG) injection 0-5 Units  0-5 Units Subcutaneous QHS Laurey Morale, MD       ondansetron [facility]) injection 4 mg  4 mg Intravenous Q6H PRN Laurey Morale, MD       Oral care mouth rinse  15 mL Mouth Rinse PRN Laurey Morale, MD       pantoprazole (PROTONIX) EC tablet 40 mg  40 mg Oral BID Laurey Morale, MD   40 mg at 03/22/23 1049   polyethylene glycol (MIRALAX / GLYCOLAX) packet 17 g  17 g Oral BID Laurey Morale, MD   17 g at 03/22/23 1043   rosuvastatin (CRESTOR) tablet 40 mg  40 mg Oral Daily Laurey Morale, MD   40 mg at 03/22/23 1047   sildenafil  (REVATIO) tablet 40 mg  40 mg Oral TID Laurey Morale, MD   40 mg at 03/22/23 1044   sodium chloride flush (NS)  0.9 % injection 3 mL  3 mL Intravenous Q12H Laurey Morale, MD   3 mL at 03/22/23 1100   sodium chloride flush (NS) 0.9 % injection 3 mL  3 mL Intravenous PRN Laurey Morale, MD       sodium chloride flush (NS) 0.9 % injection 3 mL  3 mL Intravenous Q12H Laurey Morale, MD   3 mL at 03/22/23 1100   sodium chloride flush (NS) 0.9 % injection 3 mL  3 mL Intravenous PRN Laurey Morale, MD       spironolactone (ALDACTONE) tablet 12.5 mg  12.5 mg Oral Daily Laurey Morale, MD   12.5 mg at 03/22/23 1049   [START ON 03/23/2023] torsemide (DEMADEX) tablet 20 mg  20 mg Oral q morning Laurey Morale, MD        Allergies as of 03/16/2023 - Review Complete 03/16/2023  Allergen Reaction Noted   Beef-derived drug products Other (See Comments) 12/15/2022   Pork-derived products Other (See Comments) 12/15/2022    Family History  Problem Relation Age of Onset   Diabetes Mother    Chronic Renal Failure Father    Diabetes Father    Chronic Renal Failure Brother    Diabetes Brother    Diabetes Brother    Heart attack Brother    Stroke Brother     Social History   Socioeconomic History   Marital status: Married    Spouse name: Amani   Number of children: 5   Years of education: Not on file   Highest education level: High school graduate  Occupational History   Occupation: part time  Tobacco Use   Smoking status: Never   Smokeless tobacco: Never  Vaping Use   Vaping status: Never Used  Substance and Sexual Activity   Alcohol use: No   Drug use: No   Sexual activity: Not on file  Other Topics Concern   Not on file  Social History Narrative   ** Merged History Encounter **       Social Drivers of Health   Financial Resource Strain: High Risk (12/13/2022)   Overall Financial Resource Strain (CARDIA)    Difficulty of Paying Living Expenses: Very hard  Food  Insecurity: No Food Insecurity (03/20/2023)   Hunger Vital Sign    Worried About Running Out of Food in the Last Year: Never true    Ran Out of Food in the Last Year: Never true  Transportation Needs: No Transportation Needs (03/20/2023)   PRAPARE - Administrator, Civil Service (Medical): No    Lack of Transportation (Non-Medical): No  Physical Activity: Sufficiently Active (11/27/2019)   Received from [facility]   Exercise Vital Sign    Days of Exercise per Week: 4 days    Minutes of Exercise per Session: 60 min  Stress: Stress Concern Present (11/27/2019)   Received from [facility], [facility] of Occupational Health - Occupational Stress Questionnaire    Feeling of Stress : To some extent  Social Connections: Socially Integrated (03/20/2023)   Social Connection and Isolation Panel [NHANES]    Frequency of Communication with Friends and Family: More than three times a week    Frequency of Social Gatherings with Friends and Family: More than three times a week    Attends Religious Services: More than 4 times per year    Active Member of [employer] or Organizations: Yes    Attends Banker Meetings:  More than 4 times per year    Marital Status: Married  Intimate Partner Violence: Patient Unable To Answer (03/20/2023)   Humiliation, Afraid, Rape, and Kick questionnaire    Fear of Current or Ex-Partner: Patient unable to answer    Emotionally Abused: Patient unable to answer    Physically Abused: Patient unable to answer    Sexually Abused: Patient unable to answer    Review of Systems: Pertinent positive and negative review of systems were noted in the above HPI section.  All other review of systems was otherwise negative.   Physical Exam: Vital signs in last 24 hours: Temp:  [97.7 F (36.5 C)-100 F (37.8 C)] 99.1 F (37.3 C) (03/12 0600) Pulse Rate:  [68-78] 71 (03/12 0600) Resp:  [9-25] 13 (03/12 0600) BP:  (87-107)/(54-69) 91/60 (03/12 0600) SpO2:  [94 %-100 %] 100 % (03/12 0600) Weight:  [67.4 kg] 67.4 kg (03/12 0500) Last BM Date : 03/19/23 General:   Alert,  Well-developed, thin older male, pleasant and cooperative in NAD, sitting up in chair Head:  Normocephalic and atraumatic. Eyes:  Sclera clear, no icterus.   Conjunctiva pink. Ears:  Normal auditory acuity. Nose:  No deformity, discharge,  or lesions. Mouth:  No deformity or lesions.   Neck:  Supple; no masses or thyromegaly. Lungs:  Clear throughout to auscultation, decreased breath sounds bilateral bases right greater than left  Heart:  Regular rate and rhythm; no murmurs, clicks, rubs,  or gallops. Abdomen:  Soft,nontender, BS active,nonpalp mass or hsm.   Rectal: Not done today Msk:  Symmetrical without gross deformities. . Pulses:  Normal pulses noted. Extremities:  Without clubbing or edema. Neurologic:  Alert and  oriented x4;  grossly normal neurologically. Skin:  Intact without significant lesions or rashes.. Psych:  Alert and cooperative. Normal mood and affect.  Intake/Output from previous day: 03/11 0701 - 03/12 0700 In: 1709.8 [P.O.:1196; I.V.:513.8] Out: 2025 [Urine:2025] Intake/Output this shift: No intake/output data recorded.  Lab Results: Recent Labs    03/20/23 0958 03/20/23 1638  WBC  --  6.2  HGB 13.9 13.0  HCT 41.0 40.2  PLT  --  229   BMET Recent Labs    03/20/23 1638 03/21/23 0432 03/22/23 0523  NA 138 139 135  K 3.6 3.3* 3.9  CL 102 103 100  CO2 28 24 22   GLUCOSE 160* 150* 111*  BUN 15 17 16   CREATININE 1.38* 1.50* 1.60*  CALCIUM 9.0 8.7* 9.0   LFT Recent Labs    03/20/23 1638  PROT 6.6  ALBUMIN 3.3*  AST 22  ALT 17  ALKPHOS 40  BILITOT 0.8   PT/INR No results for input(s): "LABPROT", "INR" in the last 72 hours. Hepatitis Panel No results for input(s): "HEPBSAG", "HCVAB", "HEPAIGM", "HEPBIGM" in the last 72 hours.    IMPRESSION:  #61 68 year old male with acute on  chronic congestive heart failure with severe ischemic cardiomyopathy.  Current EF about 25%. 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  Now on milrinone and sildenafil. On admission there was going to be consideration for LVAD but at this time with his symptoms of fatigue and weight loss further workup felt indicated and holding off on LVAD.  2 coronary artery disease status post MI October 2023, second MI November 2024 at that time requiring LAD stent. On Plavix and Eliquis  #3 atrial fibrillation paroxysmal 4.  Diabetes mellitus 5.  Pulmonary hypertension 6.  Pleural effusion right greater than left status  post thoracentesis, symptomatically improved postthoracentesis, cytology is pending #7 modified swallowing study today with no evidence for any obvious aspiration #8 abnormal CT with possible gastric thickening along the greater curvature Patient was recently treated for H. pylori, completed therapy and has been on daily PPI Unclear if this changes related to underdistention or possible lesion.  Plan; from GI perspective continue daily PPI We can plan for EGD with Dr. Tomasa Rand with patient on Eliquis and Plavix as anticipate cardiology will not want him to stop either of these at present. Will discuss timing with cardiology, if he is felt stable from a cardiopulmonary perspective this may be able to be done tomorrow. Discussed EGD in detail with the patient including indications risk benefits and he is agreeable to proceed.  He is a high risk candidate for sedation given his EF of 20 to 25%    Suriyah Vergara EsterwoodPA-C  03/22/2023, 11:48 AM

## 2023-03-22 NOTE — Evaluation (Signed)
 Modified Barium Swallow Study  Patient Details  Name: Garrett Roberts MRN: [id number] Date of Birth: [birth year]  Today's Date: 03/22/2023  Modified Barium Swallow completed.  Full report located under Chart Review in the Imaging Section.  History of Present Illness 68 year old male PMHx presented to [facility] 3/10 with dyspnea, congestive heart failure. Underwent right heart cath 3/11; pulmonology following for right-sided pleural effusion on CT chest. Dx also includes FTT, concern for end stage HF but w/u continues. S/P right thoracentesis 3/11. MBS ordered to r/o chronic silent aspiration. Thirty pound unintentional weight loss since November. Followed by [facility] in January 2025 for epigastric pain - treated for H. Pylori. EGD recommended but deferred. PMHx hypertension, hyperlipidemia, diabetes, CAD, ischemic cardiomyopathy, congestive heart failure, atrial fibrillation, STEMI 2023, anterior STEMI 2024.   Clinical Impression Mr. Ogle presents with a functional oropharyngeal swallow.  There was adequate mastication and bolus control.  There was diminished base-of-tongue and pharyngeal contraction, leading to trace residue post-swallow (not enough to warrant concern for spillage into the larynx).  There was reliable laryngeal-vestibule closure with no penetration/aspiration.  There was partial distention of the PE opening.  An esophageal sweep revealed no obvious barium retention and a 13 mm barium pill traversed the esophagus swiftly. There were no obvious physiological impairments that would support chronic aspiration.  Recommend continuing current diet, thin liquids. Meds whole with liquid. No further SLP f/u is needed. Pt watched the study in real time and results were discussed afterward.  DIGEST Swallow Severity Rating*  Safety: 0  Efficiency:0  Overall Pharyngeal Swallow Severity: 0 - normal   1: mild; 2: moderate; 3: severe; 4: profound  *The Dynamic Imaging Grade of  Swallowing Toxicity is standardized for the head and neck cancer population, however, demonstrates promising clinical applications across populations to standardize the clinical rating of pharyngeal swallow safety and severity.    Factors that may increase risk of adverse event in presence of aspiration Rubye Oaks & Clearance Coots 2021):  none  Swallow Evaluation Recommendations Recommendations: PO diet PO Diet Recommendation: Regular;Thin liquids (Level 0) Liquid Administration via: Cup;Straw Medication Administration: Whole meds with liquid Supervision: Patient able to self-feed Oral care recommendations: Oral care BID (2x/day)    Shaquita [place] Frederic, MA CCC/SLP Clinical Specialist - Acute Care SLP Acute Rehabilitation Services Office number [phone]   Blenda Mounts Laurice 03/22/2023,11:03 AM

## 2023-03-22 NOTE — Progress Notes (Signed)
 Initial Nutrition Assessment  DOCUMENTATION CODES:   Severe malnutrition in context of chronic illness  INTERVENTION:   Liberalize diet to REGULAR, continue 1800 mL Fluid restriction for now Pt may benefit from small, frequent meal pattern Pt to automatically receive fish (tilapia or salmon), rice and either greek yogurt or pudding on all meal trays as these are the foods pt has identified that he tolerates best. Pt may order other foods based on his preferences  Ensure Enlive po TID, each supplement provides 350 kcal and 20 grams of protein.  Add MVI with Minerals  NUTRITION DIAGNOSIS:   Severe Malnutrition related to chronic illness as evidenced by severe fat depletion, percent weight loss, energy intake < or equal to 75% for > or equal to 1 month, severe muscle depletion.   GOAL:   Patient will meet greater than or equal to 90% of their needs   MONITOR:   PO intake, Supplement acceptance, Weight trends  REASON FOR ASSESSMENT:   Consult Assessment of nutrition requirement/status  ASSESSMENT:   68 yo male presented to ED with worsening fatigue and dypsnea and admitted with acute on chronic CHF with severe ischemic CM, current EF 25%, pleural effusion, abnormal CT abdomen with hx of wt loss, early satiety and GI discomfort. Noted recent admission 03/07/23 for acute CHF. PMH includes HTN, HLD, DM, CAD, ischemic CM, CHF, a.fib. Noted pt had STEMI in 2023, anterior STEMI in 2024  3/11 CT chest with right-sided pleural effusion s/p thoracentesis- 1L removed  Noted GI has been consulted; pt with significant wt loss, early satiety, GI discomfort. CT abdomen/pelvis with gastric wall thickening, ?gastritis but need to rule out possible malignancy. Large amount of stool throughout colon.   Pt ate a few bites of chicken/rice at lunch and 100% of greek yogurt. Pt reports he does get hungry, does have an appetite but after he takes a bite or 2 of food, he cannot seem to get it down,  loses the taste for it. Limited documentation of meals, pt ate 75% of lunch yesterday.  Pt reports symptoms have persisted since December. Pt tries to eat small amounts of various foods but that is about all. Pt has been making a smoothie with various fruits/veggies and protein power 1 or 2 times per day. Pt also drinks hot water in the morning with ginger, lemon and honey.   Pt denies issues chewing or swallowing. Pt has lost a few teeth over the last few years but otherwise dentition is adequate. Pt reports no dysphagia. Noted MBS today with no issues (reg with thin liquid consistency recommended)Pt reports foods do not get stuck in esophagus/chest area either. Pt does indicate that he does experience regurgitation after eating bites but has only thrown up 2-3 times over the past few months.   Reports 30 pound unintentional wt loss since December. Noted pt with multiple recent hospitalizations.   UBW: 183 pounds Current wt 149 pounds (67.4 kg) % wt loss: 19% wt loss in 3 months  Pt able to show RD a picture of his face from December 2024 which is strikingly full compared to now; temporal wasting and orbital/buccal subcutaneous fat loss very noticeable on exam today.   Last BM 3/09. Noted large amount of stool throughout colon per CT. Pt reports he has been experiencing constipation as well at home. Pt reports he has a BM every 2-3 days. Noted miralax ordered today  Pt with significant fatigue, weakness. Pt denies shortness of breath.   Labs: CBGs 95-184 (  of note, RD present when CBG of 184 obtained and pt had just finished eating his lunch) BUN wdl, Creatinine 1.60 (H) Sodium 135 wdl Potassium 3.9 wdl  Meds: Miralax BID started today Torsemide    NUTRITION - FOCUSED PHYSICAL EXAM:  Flowsheet Row Most Recent Value  Orbital Region Severe depletion  Upper Arm Region Severe depletion  Thoracic and Lumbar Region Severe depletion  Buccal Region Severe depletion  Temple Region Severe  depletion  Clavicle Bone Region Severe depletion  Clavicle and Acromion Bone Region Severe depletion  Scapular Bone Region Severe depletion  Dorsal Hand Moderate depletion  Patellar Region Moderate depletion  Anterior Thigh Region Moderate depletion  Posterior Calf Region Moderate depletion  Hair Reviewed  Eyes Unable to assess  Mouth Reviewed  Skin Reviewed  Nails Reviewed       Diet Order:  2g sodium with 1800 mL fluid restriction   EDUCATION NEEDS:   Education needs have been addressed  Skin:  Skin Assessment: Reviewed RN Assessment  Last BM:  3/9  Height:   Ht Readings from Last 1 Encounters:  03/21/23 6' (1.829 m)    Weight:   Wt Readings from Last 1 Encounters:  03/22/23 67.4 kg   BMI:  Body mass index is 20.15 kg/m.  Estimated Nutritional Needs:   Kcal:  2000-2200 kcals  Protein:  100-120 g  Fluid:  1.8 L    Romelle Starcher MS, RDN, LDN, CNSC Registered Dietitian 3 Clinical Nutrition RD Inpatient Contact Info in Amion

## 2023-03-22 NOTE — Progress Notes (Signed)
 Patient ID: Garrett Roberts, male   DOB: 1955/10/21, 68 y.o.   MRN: [id number]     Advanced Heart Failure Rounding Note  Cardiologist: Elder Negus, MD  Chief Complaint: CHF Subjective:    Milrinone stopped yesterday, Co-ox today 76%.  He had right thoracentesis yesterday, 1000 cc off.  Breathing improved.    CT chest: moderate right pleural effusion, bibasilar bronchiectasis.   Swan numbers:  RA 1 PA 41/15 Unable to get accurate wedge (overwedges) CI 2.7  RHC Procedural Findings (3/10): Hemodynamics (mmHg) RA mean 2 RV 58/5 PA 60/18, mean 31 PCWP mean 17 Oxygen saturations: PA 70% AO 99% Cardiac Output (Thermo) 3.81  Cardiac Index (Thermo) 2.05 PVR 3.7 WU Cardiac Output (Fick) 4.68  Cardiac Index (Fick) 2.52  PVR 3 WU PAPi 21   Objective:   Weight Range: 67.4 kg Body mass index is 20.15 kg/m.   Vital Signs:   Temp:  [97.5 F (36.4 C)-100 F (37.8 C)] 99.1 F (37.3 C) (03/12 0600) Pulse Rate:  [65-78] 71 (03/12 0600) Resp:  [9-25] 13 (03/12 0600) BP: (87-107)/(54-69) 91/60 (03/12 0600) SpO2:  [94 %-100 %] 100 % (03/12 0600) Weight:  [67.4 kg] 67.4 kg (03/12 0500) Last BM Date : 03/19/23  Weight change: Filed Weights   03/20/23 1634 03/21/23 0520 03/22/23 0500  Weight: 70.8 kg 68.2 kg 67.4 kg    Intake/Output:   Intake/Output Summary (Last 24 hours) at 03/22/2023 0704 Last data filed at 03/22/2023 0700 Gross per 24 hour  Intake 1709.78 ml  Output 2025 ml  Net -315.22 ml      Physical Exam    General: NAD Neck: No JVD, no thyromegaly or thyroid nodule.  Lungs: Clear to auscultation bilaterally with normal respiratory effort. CV: Nondisplaced PMI.  Heart regular S1/S2, no S3/S4, no murmur.  No peripheral edema.   Abdomen: Soft, nontender, no hepatosplenomegaly, no distention.  Skin: Intact without lesions or rashes.  Neurologic: Alert and oriented x 3.  Psych: Normal affect. Extremities: No clubbing or cyanosis.  HEENT: Normal.    Telemetry   NSR with occasional PVCs (personally reviewed)   Labs    CBC Recent Labs    03/20/23 0958 03/20/23 1638  WBC  --  6.2  NEUTROABS  --  3.1  HGB 13.9 13.0  HCT 41.0 40.2  MCV  --  90.7  PLT  --  229   Basic Metabolic Panel Recent Labs    40/98/11 1638 03/21/23 0432 03/22/23 0523  NA 138 139 135  K 3.6 3.3* 3.9  CL 102 103 100  CO2 28 24 22   GLUCOSE 160* 150* 111*  BUN 15 17 16   CREATININE 1.38* 1.50* 1.60*  CALCIUM 9.0 8.7* 9.0  MG 2.3  --   --    Liver Function Tests Recent Labs    03/20/23 1638  AST 22  ALT 17  ALKPHOS 40  BILITOT 0.8  PROT 6.6  ALBUMIN 3.3*   No results for input(s): "LIPASE", "AMYLASE" in the last 72 hours. Cardiac Enzymes No results for input(s): "CKTOTAL", "CKMB", "CKMBINDEX", "TROPONINI" in the last 72 hours.  BNP: BNP (last 3 results) Recent Labs    03/07/23 1215 03/14/23 1942 03/20/23 1638  BNP 603.8* 812.0* 1,030.3*    ProBNP (last 3 results) No results for input(s): "PROBNP" in the last 8760 hours.   D-Dimer No results for input(s): "DDIMER" in the last 72 hours. Hemoglobin A1C No results for input(s): "HGBA1C" in the last 72 hours. Fasting Lipid Panel No  results for input(s): "CHOL", "HDL", "LDLCALC", "TRIG", "CHOLHDL", "LDLDIRECT" in the last 72 hours. Thyroid Function Tests Recent Labs    03/20/23 1638  TSH 9.887*    Other results:   Imaging    CT ABDOMEN PELVIS WO CONTRAST Result Date: 03/21/2023 CLINICAL DATA:  Unintentional weight loss. EXAM: CT ABDOMEN AND PELVIS WITHOUT CONTRAST TECHNIQUE: Multidetector CT imaging of the abdomen and pelvis was performed following the standard protocol without IV contrast. RADIATION DOSE REDUCTION: This exam was performed according to the departmental dose-optimization program which includes automated exposure control, adjustment of the mA and/or kV according to patient size and/or use of iterative reconstruction technique. COMPARISON:  CT abdomen and  pelvis 12/10/2022 FINDINGS: Lower chest: There are trace bilateral pleural effusions, left greater than right, with atelectasis in the bilateral lung bases. There some smooth interlobular septal thickening favored as edema. Hepatobiliary: No focal liver abnormality is seen. No gallstones, gallbladder wall thickening, or biliary dilatation. Pancreas: Unremarkable. No pancreatic ductal dilatation or surrounding inflammatory changes. Spleen: Normal in size without focal abnormality. Adrenals/Urinary Tract: The bladder is distended. The kidneys and adrenal glands are within normal limits. Stomach/Bowel: There is questionable gastric wall thickening at the level of the greater curvature, unchanged., versus normal under distention. Appendix appears normal. No evidence of bowel wall thickening, distention, or inflammatory changes. There is a large amount of stool throughout the colon. Vascular/Lymphatic: Aortic atherosclerosis. No enlarged abdominal or pelvic lymph nodes. Reproductive: Prostate is unremarkable. Other: No abdominal wall hernia or abnormality. No abdominopelvic ascites. Musculoskeletal: No acute or significant osseous findings. IMPRESSION: 1. No acute localizing process in the abdomen or pelvis. 2. Questionable gastric wall thickening at the level of the greater curvature, unchanged. Correlate clinically for gastritis. 3. Trace bilateral pleural effusions, left greater than right, with atelectasis in the bilateral lung bases. 4. Distended bladder. 5. Large amount of stool throughout the colon. 6. Aortic atherosclerosis. Aortic Atherosclerosis (ICD10-I70.0). Electronically Signed   By: Darliss Cheney M.D.   On: 03/21/2023 19:09   DG CHEST PORT 1 VIEW Result Date: 03/21/2023 CLINICAL DATA:  Status post thoracentesis. EXAM: PORTABLE CHEST 1 VIEW COMPARISON:  Radiograph yesterday FINDINGS: No evidence of pneumothorax post thoracentesis. The right internal jugular Swan-Ganz catheter tip remains in the region of  the right lower lobe pulmonary outflow track, retraction is again recommended. Similar cardiomegaly and vascular congestion. Improved infrahilar opacities. IMPRESSION: 1. No pneumothorax post thoracentesis. 2. Improved infrahilar opacities. 3. Similar cardiomegaly and vascular congestion. 4. Swan-Ganz catheter tip remains in the region of the right lower lobe pulmonary outflow track, retraction is recommended. Electronically Signed   By: Narda Rutherford M.D.   On: 03/21/2023 17:59     Medications:     Scheduled Medications:  amiodarone  200 mg Oral Daily   apixaban  5 mg Oral BID   Chlorhexidine Gluconate Cloth  6 each Topical Daily   digoxin  0.0625 mg Oral Daily   empagliflozin  10 mg Oral Daily   insulin aspart  0-15 Units Subcutaneous TID WC   insulin aspart  0-5 Units Subcutaneous QHS   pantoprazole  40 mg Oral BID   rosuvastatin  40 mg Oral Daily   sildenafil  40 mg Oral TID   sodium chloride flush  3 mL Intravenous Q12H   sodium chloride flush  3 mL Intravenous Q12H   spironolactone  12.5 mg Oral Daily   torsemide  20 mg Oral q morning    Infusions:  sodium chloride 10 mL/hr at 03/22/23 0700  PRN Medications: sodium chloride, acetaminophen, ondansetron (ZOFRAN) IV, mouth rinse, sodium chloride flush, sodium chloride flush    Assessment/Plan   1. 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 Echo 2/25 with EF 25%, mildly reduced RV function. NYHA class IIIb-IV, primarily due to profound fatigue.  Low BP limits GDMT titration, SBP 80s-90s at home.  His profound fatigue has been concerning for low output HF, RHC set up for today.  RHC 3/10 showed low CI by thermo (2.05) but preserved by Fick.  Borderline elevated PCWP, normal RA pressure.  There was also moderate mixed pulmonary arterial/pulmonary venous hypertension.  With his significant symptoms, I started  him on milrinone 0.25 and added sildenafil to try to lower PA pressure and watch him with Ernestine Conrad in. I have now weaned him off milrinone.  This morning, PA 41/15 with CI 2.7 and co-ox 76% with RA 1 off milrinone. I think we can hold off on LVAD evaluation at this time given current hemodynamics, and I think some of his symptoms of fatigue and wieght loss may be GI in nature.   - Continue sildenafil 40 tid for moderate pulmonary arterial hypertension.   - Hold torsemide today, start tomorrow at 20 mg daily.   - Can remove Swan, follow CVP/co-ox off introducer.  - Continue Jardiance 10 mg daily. - Continue digoxin 0.0625 daily (use low dose with relatively small size and CKD stage 3).  - I have stopped Toprol XL with concern for low output.  - Continue spironolactone 12.5 at bedtime.   - He needs ICD as outpatient.  He qualifies with persistently low EF.  Narrow QRS so not CRT candidate.    2. CAD:  Hx inferior STEMI 10/23 s/p PCI/DES to RCA. Anterior STEMI 12/10/22, cath showed occluded pLAD treated with PCI/DES, prior RCA stent patent, severe disease OM1 and distal LCx treated medically. No chest pain.  - Continue Plavix and Eliquis.  - Continue rosuvastatin 40 mg + Zetia 10 mg daily. 3. Atrial fibrillation: Paroxysmal. NSR today.  - Continue Eliquis 5 mg BID - Decrease amiodarone back to 200 mg daily now that off milrinone.  4. DM II: Continue Jardiance, add SSI in hospital.  5. 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 Not a smoker, no known lung disease. CXR has shown some chronic interstitial changes. HRCT chest with bibasilar bronchiectasis that may be due to aspiration.   - Sildenafil as above.  - Will need V/Q scan to rule out chronic PE => will order today.  6. Pleural effusion: Moderate on right. S/p right thoracentesis.   - Need to send serum LDH and protein for Light's criteria calculation.  - Cytology pending 7. GI: Has had significant  weight loss, early satiety, GI discomfort.  CT abdomen/pelvis with gastric wall thickening, ?gastritis but think need to rule out malignancy.   - Will ask GI to see here, can we go ahead and get EGD?  - Continue PPI 8. Possible aspiration: Lung changes may be aspiration-related.  - Will get swallow study.  - PPI.   OK for 2C  CRITICAL CARE Performed by: Marca Ancona  Total critical care time: 35 minutes  Critical care time was exclusive of separately billable procedures and treating other patients.  Critical care was necessary to treat or prevent imminent or life-threatening deterioration.  Critical care was time spent personally by me on the following activities: development  of treatment plan with patient and/or surrogate as well as nursing, discussions with consultants, evaluation of patient's response to treatment, examination of patient, obtaining history from patient or surrogate, ordering and performing treatments and interventions, ordering and review of laboratory studies, ordering and review of radiographic studies, pulse oximetry and re-evaluation of patient's condition.   Length of Stay: 2  Marca Ancona, MD  03/22/2023, 7:04 AM  Advanced Heart Failure Team Pager [phone] (M-F; 7a - 5p)  Please contact [facility] for night-coverage after hours (5p -7a ) and weekends on [email or web address]

## 2023-03-23 ENCOUNTER — Encounter (HOSPITAL_COMMUNITY)
Admission: RE | Disposition: A | Discharge status: Home / Self Care | Payer: Self-pay | Source: Home / Self Care | Attending: Cardiology

## 2023-03-23 ENCOUNTER — Encounter (HOSPITAL_COMMUNITY): Payer: Self-pay | Admitting: Cardiology

## 2023-03-23 ENCOUNTER — Other Ambulatory Visit (HOSPITAL_COMMUNITY): Payer: Self-pay

## 2023-03-23 ENCOUNTER — Inpatient Hospital Stay (HOSPITAL_COMMUNITY): Payer: Self-pay | Admitting: Certified Registered"

## 2023-03-23 DIAGNOSIS — R634 Abnormal weight loss: Secondary | ICD-10-CM | POA: Diagnosis not present

## 2023-03-23 DIAGNOSIS — K449 Diaphragmatic hernia without obstruction or gangrene: Secondary | ICD-10-CM

## 2023-03-23 DIAGNOSIS — K2950 Unspecified chronic gastritis without bleeding: Secondary | ICD-10-CM

## 2023-03-23 DIAGNOSIS — K31A19 Gastric intestinal metaplasia without dysplasia, unspecified site: Secondary | ICD-10-CM

## 2023-03-23 DIAGNOSIS — I5023 Acute on chronic systolic (congestive) heart failure: Secondary | ICD-10-CM

## 2023-03-23 DIAGNOSIS — I2510 Atherosclerotic heart disease of native coronary artery without angina pectoris: Secondary | ICD-10-CM | POA: Diagnosis not present

## 2023-03-23 DIAGNOSIS — R933 Abnormal findings on diagnostic imaging of other parts of digestive tract: Secondary | ICD-10-CM

## 2023-03-23 DIAGNOSIS — I130 Hypertensive heart and chronic kidney disease with heart failure and stage 1 through stage 4 chronic kidney disease, or unspecified chronic kidney disease: Secondary | ICD-10-CM

## 2023-03-23 HISTORY — PX: ESOPHAGOGASTRODUODENOSCOPY: SHX5428

## 2023-03-23 LAB — GLUCOSE, CAPILLARY
Glucose-Capillary: 110 mg/dL — ABNORMAL HIGH (ref 70–99)
Glucose-Capillary: 109 mg/dL — ABNORMAL HIGH (ref 70–99)
Glucose-Capillary: 129 mg/dL — ABNORMAL HIGH (ref 70–99)
Glucose-Capillary: 234 mg/dL — ABNORMAL HIGH (ref 70–99)
Glucose-Capillary: 171 mg/dL — ABNORMAL HIGH (ref 70–99)

## 2023-03-23 LAB — DIGOXIN LEVEL: Digoxin Level: <0.2 ng/mL — ABNORMAL LOW (ref 0.8–2.0)

## 2023-03-23 LAB — BASIC METABOLIC PANEL
Anion gap: 9 (ref 5–15)
BUN: 21 mg/dL (ref 8–23)
CO2: 25 mmol/L (ref 22–32)
Calcium: 9 mg/dL (ref 8.9–10.3)
Chloride: 100 mmol/L (ref 98–111)
Creatinine, Ser: 1.22 mg/dL (ref 0.61–1.24)
GFR, Estimated: >60 mL/min (ref >60)
Glucose, Bld: 113 mg/dL — ABNORMAL HIGH (ref 70–99)
Potassium: 4.3 mmol/L (ref 3.5–5.1)
Sodium: 134 mmol/L — ABNORMAL LOW (ref 135–145)

## 2023-03-23 LAB — CYTOLOGY - NON PAP

## 2023-03-23 LAB — COOXEMETRY PANEL
Carboxyhemoglobin: 1.3 % (ref 0.5–1.5)
Methemoglobin: <0.7 % (ref 0.0–1.5)
O2 Saturation: 67 %
Total hemoglobin: 14 g/dL (ref 12.0–16.0)

## 2023-03-23 SURGERY — EGD (ESOPHAGOGASTRODUODENOSCOPY)
Anesthesia: Monitor Anesthesia Care

## 2023-03-23 MED ORDER — DEXMEDETOMIDINE HCL IN NACL 80 MCG/20ML IV SOLN
INTRAVENOUS | Status: DC | PRN
Start: 2023-03-23 — End: 2023-03-23
  Administered 2023-03-23: 4 ug via INTRAVENOUS
  Administered 2023-03-23: 8 ug via INTRAVENOUS

## 2023-03-23 MED ORDER — PROPOFOL 500 MG/50ML IV EMUL
INTRAVENOUS | Status: DC | PRN
  Administered 2023-03-23: 25 ug/kg/min via INTRAVENOUS

## 2023-03-23 MED ORDER — PROPOFOL 10 MG/ML IV BOLUS
INTRAVENOUS | Status: DC | PRN
  Administered 2023-03-23: 20 mg via INTRAVENOUS

## 2023-03-23 MED ORDER — CIPROFLOXACIN HCL 750 MG PO TABS
750.0000 mg | ORAL_TABLET | Freq: Two times a day (BID) | ORAL | Status: DC
  Administered 2023-03-23 – 2023-03-24 (×2): 750 mg via ORAL
  Filled 2023-03-23 (×3): qty 1

## 2023-03-23 MED ORDER — VASOPRESSIN 20 UNIT/ML IV SOLN
INTRAVENOUS | Status: DC | PRN
  Administered 2023-03-23 (×3): 1 [IU] via INTRAVENOUS

## 2023-03-23 MED ORDER — APIXABAN 5 MG PO TABS
5.0000 mg | ORAL_TABLET | Freq: Two times a day (BID) | ORAL | Status: DC
  Administered 2023-03-23 – 2023-03-24 (×2): 5 mg via ORAL
  Filled 2023-03-23 (×2): qty 1

## 2023-03-23 NOTE — Plan of Care (Signed)
  Problem: Education: Goal: Knowledge of General Education information will improve Description: Including pain rating scale, medication(s)/side effects and non-pharmacologic comfort measures Outcome: Progressing   Problem: Health Behavior/Discharge Planning: Goal: Ability to manage health-related needs will improve Outcome: Progressing   Problem: Clinical Measurements: Goal: Will remain free from infection Outcome: Progressing Goal: Respiratory complications will improve Outcome: Progressing   Problem: Activity: Goal: Risk for activity intolerance will decrease Outcome: Progressing   Problem: Nutrition: Goal: Adequate nutrition will be maintained Outcome: Progressing   

## 2023-03-23 NOTE — Plan of Care (Signed)
  Problem: Education: Goal: Understanding of CV disease, CV risk reduction, and recovery process will improve Outcome: Progressing Goal: Individualized Educational Video(s) Outcome: Progressing   Problem: Activity: Goal: Ability to return to baseline activity level will improve Outcome: Progressing   Problem: Cardiovascular: Goal: Ability to achieve and maintain adequate cardiovascular perfusion will improve Outcome: Progressing Goal: Vascular access site(s) Level 0-1 will be maintained Outcome: Progressing   Problem: Health Behavior/Discharge Planning: Goal: Ability to safely manage health-related needs after discharge will improve Outcome: Progressing   Problem: Education: Goal: Knowledge of General Education information will improve Description: Including pain rating scale, medication(s)/side effects and non-pharmacologic comfort measures Outcome: Progressing   Problem: Health Behavior/Discharge Planning: Goal: Ability to manage health-related needs will improve Outcome: Progressing   Problem: Clinical Measurements: Goal: Ability to maintain clinical measurements within normal limits will improve Outcome: Progressing Goal: Will remain free from infection Outcome: Progressing Goal: Diagnostic test results will improve Outcome: Progressing Goal: Respiratory complications will improve Outcome: Progressing Goal: Cardiovascular complication will be avoided Outcome: Progressing   Problem: Activity: Goal: Risk for activity intolerance will decrease Outcome: Progressing   Problem: Nutrition: Goal: Adequate nutrition will be maintained Outcome: Progressing   Problem: Coping: Goal: Level of anxiety will decrease Outcome: Progressing   Problem: Elimination: Goal: Will not experience complications related to bowel motility Outcome: Progressing Goal: Will not experience complications related to urinary retention Outcome: Progressing   Problem: Pain Managment: Goal:  General experience of comfort will improve and/or be controlled Outcome: Progressing   Problem: Safety: Goal: Ability to remain free from injury will improve Outcome: Progressing   Problem: Skin Integrity: Goal: Risk for impaired skin integrity will decrease Outcome: Progressing   Problem: Education: Goal: Ability to describe self-care measures that may prevent or decrease complications (Diabetes Survival Skills Education) will improve Outcome: Progressing Goal: Individualized Educational Video(s) Outcome: Progressing   Problem: Coping: Goal: Ability to adjust to condition or change in health will improve Outcome: Progressing   Problem: Fluid Volume: Goal: Ability to maintain a balanced intake and output will improve Outcome: Progressing   Problem: Health Behavior/Discharge Planning: Goal: Ability to identify and utilize available resources and services will improve Outcome: Progressing Goal: Ability to manage health-related needs will improve Outcome: Progressing   Problem: Metabolic: Goal: Ability to maintain appropriate glucose levels will improve Outcome: Progressing   Problem: Nutritional: Goal: Maintenance of adequate nutrition will improve Outcome: Progressing Goal: Progress toward achieving an optimal weight will improve Outcome: Progressing   Problem: Skin Integrity: Goal: Risk for impaired skin integrity will decrease Outcome: Progressing   Problem: Tissue Perfusion: Goal: Adequacy of tissue perfusion will improve Outcome: Progressing

## 2023-03-23 NOTE — Plan of Care (Signed)
  Problem: Clinical Measurements: Goal: Diagnostic test results will improve Outcome: Progressing Goal: Respiratory complications will improve Outcome: Progressing Goal: Cardiovascular complication will be avoided Outcome: Progressing   Problem: Coping: Goal: Level of anxiety will decrease Outcome: Progressing   Problem: Pain Managment: Goal: General experience of comfort will improve and/or be controlled Outcome: Progressing   Problem: Safety: Goal: Ability to remain free from injury will improve Outcome: Progressing

## 2023-03-23 NOTE — Anesthesia Postprocedure Evaluation (Signed)
 Anesthesia Post Note  Patient: Garrett Roberts  Procedure(s) Performed: EGD (ESOPHAGOGASTRODUODENOSCOPY)     Patient location during evaluation: PACU Anesthesia Type: MAC Level of consciousness: awake and alert Pain management: pain level controlled Vital Signs Assessment: post-procedure vital signs reviewed and stable Respiratory status: spontaneous breathing, nonlabored ventilation and respiratory function stable Cardiovascular status: stable and blood pressure returned to baseline Anesthetic complications: no   No notable events documented.  Last Vitals:  Vitals:   03/23/23 1216 03/23/23 1244  BP: 91/60   Pulse: 64 67  Resp:    Temp: (!) 36.3 C   SpO2:      Last Pain:  Vitals:   03/23/23 1216  TempSrc: Oral  PainSc:                  Beryle Lathe

## 2023-03-23 NOTE — Anesthesia Preprocedure Evaluation (Addendum)
 Anesthesia Evaluation  Patient identified by MRN, date of birth, ID band Patient awake    Reviewed: Allergy & Precautions, NPO status , Patient's Chart, lab work & pertinent test results  History of Anesthesia Complications Negative for: history of anesthetic complications  Airway Mallampati: II  TM Distance: >3 FB Neck ROM: Full    Dental  (+) Dental Advisory Given, Teeth Intact   Pulmonary neg pulmonary ROS   Pulmonary exam normal        Cardiovascular hypertension, Pt. on medications pulmonary hypertension+ CAD, + Past MI and +CHF  Normal cardiovascular exam+ dysrhythmias Atrial Fibrillation    '25 RHC - 1. Normal RA pressure.  2. Borderline elevated PCWP  3. Moderate mixed pulmonary arterial/pulmonary venous hypertension.   4. Low CO by thermodilution, preserved by Fick.   '25 TTE - EF 25%. Global hypokinesis. The left ventricular internal cavity size was mildly dilated. Grade III diastolic dysfunction (restrictive). Right ventricular systolic function is mildly reduced. The right ventricular size is mildly enlarged. There is moderately elevated pulmonary artery systolic pressure. The estimated right ventricular systolic pressure is 59.9 mmHg. Left atrial size was moderately dilated. Right atrial size was mildly dilated. Moderate functional mitral valve regurgitation. Pleural effusion noted. A small pericardial effusion is present. The pericardial effusion is localized near the right ventricle.     Neuro/Psych negative neurological ROS  negative psych ROS   GI/Hepatic Neg liver ROS,GERD  Medicated and Controlled,,  Endo/Other  diabetes, Type 2, Oral Hypoglycemic AgentsHypothyroidism    Renal/GU CRFRenal disease     Musculoskeletal negative musculoskeletal ROS (+)    Abdominal   Peds  Hematology  On eliquis, plavix    Anesthesia Other Findings   Reproductive/Obstetrics                              Anesthesia Physical Anesthesia Plan  ASA: 4  Anesthesia Plan: MAC   Post-op Pain Management: Minimal or no pain anticipated   Induction:   PONV Risk Score and Plan: 1 and Propofol infusion and Treatment may vary due to age or medical condition  Airway Management Planned: Nasal Cannula and Natural Airway  Additional Equipment: ClearSight  Intra-op Plan:   Post-operative Plan:   Informed Consent: I have reviewed the patients History and Physical, chart, labs and discussed the procedure including the risks, benefits and alternatives for the proposed anesthesia with the patient or authorized representative who has indicated his/her understanding and acceptance.       Plan Discussed with: CRNA and Anesthesiologist  Anesthesia Plan Comments:         Anesthesia Quick Evaluation

## 2023-03-23 NOTE — Discharge Summary (Incomplete)
 Advanced Heart Failure Team  Discharge Summary   Patient ID: Garrett Roberts MRN: [id number], DOB/AGE: [birth year] 68 y.o. 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 with history of CAD, DM II, HTN, HLD, ischemic cardiomyopathy/chronic systolic CHF.    Patient had inferior STEMI 10/23 s/p PCI/DES to RCA. Had residual diffuse disease in diagonals and OMs. EF 40-45% at time of cath. Subsequent echo in 10/23 with EF 55-60%.    He was not taking medications in 2024 after losing insurance. Presented with anterior STEMI 12/10/22. Cardiac cath with 100% proximal LAD treated with PCI/DES. Prior RCA stent patent, severe disease in tortuous ramus and distal LCx treated medically.  Echo with EF 25%, no LV thrombus, AK mid septum to apex, moderately reduced RV systolic function. Course complicated by atrial fibrillation with RVR. Started on IV amiodarone and underwent DCCV to SR.  GDMT limited by soft blood pressure. Also seen by GI for abdominal pain. Had thickening of stomach on CT scan. H. Pylori later resulted positive after discharge, seen by GI and started on treatment for H pylori-related gastritis.    Returned to ED 12/19/22 with recurrent atrial fibrillation. He converted to SR while in waiting room and was discharged home.   Admitted 2/25 with acute CHF. Diuresed with IV lasix. GDMT cut back 2/2 to low BP, Entresto and spironolactone stopped. He was discharged home, weight 154 lbs.  Echo in 2/25 showed EF 25% with mild RV dysfunction and mild RV enlargement, PASP 60 mmHg, moderate MR.    He was seen again in the ER 03/14/23 with worsening fatigue and dyspnea.   Torsemide was increased to 40 qam/20 qpm.    He was seen for post hospital f/u w/ Dr. Shirlee Latch on 3/6 and continued w/ profound fatigue and poor appetite, continued w/ 2 pillow orthopnea and GDMT was still limited by low BP. Given concern for low output HF, he was set up for outpatient RHC.   Cath on 3/10 demonstrated low CI by thermo (2.05) but preserved by Fick.  Borderline elevated PCWP, normal RA pressure.  There was also moderate mixed pulmonary arterial/pulmonary venous hypertension.  Given significant symptoms, he was direct admitted and started on milrinone 0.25 mcg/kg/min.  Sildenafil was also added to help lower PA pressure. Ernestine Conrad was left in place to monitor hemodynamics to guide therapies. He was continued on torsemide. ? blocker discontinued. He improved. Milrinone weaned off 3/11. Swan removed. Co-ox remained stable off of milrinone. He was continued on sildenafil for PH. V/Q scan was done to r/o CTEPH. Study was normal, no perfusion defect. Rheumatoid factor still pending but all other serologies negative.    Pt also required thoracentesis for moderate sized rt pleural effusion. Pleural fluid was transudative by Light's criteria, no malignant cells. However, fluid grew GNRs. He was started on Cipro 750 mg bid x 10 day course.   Given significant wt loss, early satiety and GI discomfort w/ CT of A/P showing gastric wall thickening, GI was also consulted to further investigate and perform EGD to r/o malignancy. EGD showed no s/o malignancy. Visualization revealed normal esophagus, 4 cm hiatal hernia and granular gastric mucosa. Biopies were taken for H  pylori testing and pathology. GI recommended treatment w/ PPI for now.   On 3/14, pt was last seen and examined by Dr. Shirlee Latch and felt stable for discharge home.   Pt instructed to continue Cipro x 10 day course. At time of d/c, we are still awaiting speciation. Will follow result as outpatient. Will adjust abx if necessary pending  susceptibilities.   Close hospital f/u has been arranged w/ Dr. Shirlee Latch 3/20. He has also been referred to EP and has consultation w/ Dr. Jimmey Ralph on 4/11 to discuss ICD.      Discharge Weight Range: 144 lb  Discharge Vitals: Blood pressure 93/64, pulse 69, temperature 97.9 F (36.6 C), temperature source Oral, resp. rate 17, height 6' (1.829 m), weight 65.7 kg, SpO2 98%.  Labs: Lab Results  Component Value Date   WBC 6.2 03/20/2023   HGB 13.0 03/20/2023   HCT 40.2 03/20/2023   MCV 90.7 03/20/2023   PLT 229 03/20/2023    Recent Labs  Lab 03/22/23 1107 03/23/23 0553 03/24/23 0505  NA  --    < > 135  K  --    < > 4.1  CL  --    < > 102  CO2  --    < > 25  BUN  --    < > 28*  CREATININE  --    < > 1.27*  CALCIUM  --    < > 9.0  PROT 7.0  --   --   BILITOT 1.0  --   --   ALKPHOS 40  --   --   ALT 18  --   --   AST 27  --   --   GLUCOSE  --    < > 131*   < > = values in this interval not displayed.   Lab Results  Component Value Date   CHOL 116 02/06/2023   HDL 40 (L) 02/06/2023   LDLCALC 60 02/06/2023   TRIG 81 02/06/2023   BNP (last 3 results) Recent Labs    03/07/23 1215 03/14/23 1942 03/20/23 1638  BNP 603.8* 812.0* 1,030.3*    ProBNP (last 3 results) No results for input(s): "PROBNP" in the last 8760 hours.   Diagnostic Studies/Procedures   RHC 03/20/23 1. Normal RA pressure.  2. Borderline elevated PCWP  3. Moderate mixed pulmonary arterial/pulmonary venous hypertension.   4. Low CO by thermodilution, preserved by Fick.   Right Heart Pressures RHC Procedural Findings: Hemodynamics (mmHg) RA mean 2 RV 58/5 PA 60/18, mean 31 PCWP mean 17  Oxygen saturations: PA 70% AO 99%  Cardiac Output (Thermo) 3.81  Cardiac Index (Thermo) 2.05 PVR 3.7 WU  Cardiac Output (Fick) 4.68  Cardiac Index (Fick) 2.52  PVR 3 WU  PAPi 21   NUCLEAR MEDICINE PERFUSION LUNG SCAN 03/22/23   TECHNIQUE: Perfusion images were obtained in multiple projections  after intravenous injection of radiopharmaceutical.   Ventilation scans intentionally deferred if perfusion scan and chest x-ray adequate for interpretation during COVID 19 epidemic.   RADIOPHARMACEUTICALS:  3.8 mCi Tc-55m MAA IV   COMPARISON:  Chest radiograph dated 03/21/2023.   FINDINGS: Uniform perfusion of the lungs.  No perfusion defect.   IMPRESSION: Normal perfusion scan.    Discharge Medications   Allergies as of 03/24/2023       Reactions   Beef-derived Drug Products Other (See Comments)   Religious observance   Pork-derived Products Other (See Comments)   Religious observance  Medication List     STOP taking these medications    metFORMIN 500 MG tablet Commonly known as: GLUCOPHAGE   nitroGLYCERIN 0.4 MG SL tablet Commonly known as: NITROSTAT   ondansetron 4 MG disintegrating tablet Commonly known as: ZOFRAN-ODT   potassium chloride SA 20 MEQ tablet Commonly known as: KLOR-CON M       TAKE these medications    amiodarone 200 MG tablet Commonly known as: PACERONE Take 1 tablet (200 mg total) by mouth daily. Start taking on: March 25, 2023 What changed: how much to take   apixaban 5 MG Tabs tablet Commonly known as: ELIQUIS Take 1 tablet (5 mg total) by mouth 2 (two) times daily.   B-12 PO Take 1 tablet by mouth daily with lunch.   ciprofloxacin 750 MG tablet Commonly known as: CIPRO Take 1 tablet (750 mg total) by mouth 2 (two) times daily.   clopidogrel 75 MG tablet Commonly known as: PLAVIX Take 1 tablet (75 mg total) by mouth daily.   Digoxin 62.5 MCG Tabs Take 0.0625 mg by mouth daily. Start taking on: March 25, 2023 What changed:  medication strength how much to take   empagliflozin 10 MG Tabs tablet Commonly known as: Jardiance Take 1 tablet (10 mg total) by mouth daily before breakfast.   ezetimibe 10 MG tablet Commonly known as: ZETIA Take 1 tablet (10 mg total) by mouth daily.   MAGNESIUM PO Take 1  tablet by mouth at bedtime.   pantoprazole 40 MG tablet Commonly known as: PROTONIX Take 1 tablet (40 mg total) by mouth 2 (two) times daily before a meal.   rosuvastatin 40 MG tablet Commonly known as: CRESTOR Take 1 tablet (40 mg total) by mouth daily.   sildenafil 20 MG tablet Commonly known as: REVATIO Take 2 tablets (40 mg total) by mouth 3 (three) times daily.   spironolactone 25 MG tablet Commonly known as: ALDACTONE Take 0.5 tablets (12.5 mg total) by mouth at bedtime.   torsemide 20 MG tablet Commonly known as: DEMADEX Take 1 tablet (20 mg total) by mouth every morning. Start taking on: March 25, 2023 What changed:  how much to take when to take this additional instructions   VITAMIN C PO Take 1 tablet by mouth daily with lunch.   VITAMIN K2-VITAMIN D3 PO Take 1 tablet by mouth daily with lunch.   ZINC PO Take 1 tablet by mouth daily after lunch.        Disposition   The patient will be discharged in stable condition to home.   Follow-up Information     [facility] Follow up.   Specialty: Family Medicine Why: establish with Primary Care Physician appt May 15, 2023 at 12 pm. please arrive 15 minutes prior to appt bring insurance card Contact information: [address] [place] [address] [phone] Additional information: [address] [place], [place] [address]        Laurey Morale, MD Follow up.   Specialty: Cardiology Why: 03/30/23 at 3:40 PM   Hospital Follow-up in the Advanced [facility] Failure [facility] at [facility], Entrance C. Valet parking avaiable Contact information: [address] [place] [address] [phone]         Nobie Putnam, MD Follow up.   Specialties: Cardiology, Radiology Why: 04/21/23 at 3:15 PM   To discuss ICD for heart failure Contact information: [address] [place] [address]  [phone]                    Signed, Robbie Lis PA-C  03/24/2023, 2:15 PM  Patient seen with PA, I formulated the plan and agree with the above note.   Please see my separate note from today for details.   I spent 37 minutes on this discharge.   Marca Ancona 03/24/2023

## 2023-03-23 NOTE — Transfer of Care (Signed)
 Immediate Anesthesia Transfer of Care Note  Patient: Garrett Roberts  Procedure(s) Performed: EGD (ESOPHAGOGASTRODUODENOSCOPY)  Patient Location: PACU and Endoscopy Unit  Anesthesia Type:MAC  Level of Consciousness: awake, alert , and oriented  Airway & Oxygen Therapy: Patient Spontanous Breathing  Post-op Assessment: Report given to RN and Post -op Vital signs reviewed and stable  Post vital signs: Reviewed and stable  Last Vitals:  Vitals Value Taken Time  BP 79/54 03/23/23 1128  Temp 36.3 C 03/23/23 1126  Pulse 64 03/23/23 1128  Resp 20 03/23/23 1128  SpO2 100 % 03/23/23 1128  Vitals shown include unfiled device data.  Last Pain:  Vitals:   03/23/23 1126  TempSrc: Temporal  PainSc: 0-No pain      Patients Stated Pain Goal: 0 (03/22/23 0800)  Complications: No notable events documented.

## 2023-03-23 NOTE — Progress Notes (Addendum)
 Patient ID: Garrett Roberts, male   DOB: 1955-12-25, 68 y.o.   MRN: [id number]     Advanced Heart Failure Rounding Note  Cardiologist: Elder Negus, MD  Chief Complaint: CHF Subjective:    Co-ox remains stable off milrinone, 67% today. CVP 2.   AKI resolved, SCr 1.60>>1.22 today   Barium swallow study unremarkable. There was reliable laryngeal-vestibule closure with no penetration/aspiration.  V/Q scan normal.   Going for EGD today.   Feels ok this morning. Denies dyspnea. No pain.    RHC Procedural Findings (3/10): Hemodynamics (mmHg) RA mean 2 RV 58/5 PA 60/18, mean 31 PCWP mean 17 Oxygen saturations: PA 70% AO 99% Cardiac Output (Thermo) 3.81  Cardiac Index (Thermo) 2.05 PVR 3.7 WU Cardiac Output (Fick) 4.68  Cardiac Index (Fick) 2.52  PVR 3 WU PAPi 21   Objective:   Weight Range: 66.6 kg Body mass index is 19.91 kg/m.   Vital Signs:   Temp:  [97.7 F (36.5 C)-98.8 F (37.1 C)] 97.7 F (36.5 C) (03/13 0539) Pulse Rate:  [69-80] 78 (03/12 2254) Resp:  [15-20] 16 (03/13 0539) BP: (94-105)/(53-66) 105/64 (03/13 0539) SpO2:  [97 %-100 %] 97 % (03/12 2254) Weight:  [66.6 kg] 66.6 kg (03/13 0539) Last BM Date : 03/19/23  Weight change: Filed Weights   03/21/23 0520 03/22/23 0500 03/23/23 0539  Weight: 68.2 kg 67.4 kg 66.6 kg    Intake/Output:   Intake/Output Summary (Last 24 hours) at 03/23/2023 0730 Last data filed at 03/22/2023 1300 Gross per 24 hour  Intake --  Output 550 ml  Net -550 ml      Physical Exam    CVP 2  General:  thin male. No respiratory difficulty HEENT: normal Neck: supple. JVD 5 cm. Carotids 2+ bilat; no bruits. No lymphadenopathy or thyromegaly appreciated. Cor: PMI nondisplaced. Regular rate & rhythm. No rubs, gallops or murmurs. Lungs: clear Abdomen: soft, nontender, nondistended. No hepatosplenomegaly. No bruits or masses. Good bowel sounds. Extremities: no cyanosis, clubbing, rash, edema + RUE  PICC Neuro: alert & oriented x 3, cranial nerves grossly intact. moves all 4 extremities w/o difficulty. Affect pleasant.   Telemetry   NSR 60s (personally reviewed)   Labs    CBC Recent Labs    03/20/23 0958 03/20/23 1638  WBC  --  6.2  NEUTROABS  --  3.1  HGB 13.9 13.0  HCT 41.0 40.2  MCV  --  90.7  PLT  --  229   Basic Metabolic Panel Recent Labs    40/98/11 1638 03/21/23 0432 03/22/23 0523 03/23/23 0553  NA 138   < > 135 134*  K 3.6   < > 3.9 4.3  CL 102   < > 100 100  CO2 28   < > 22 25  GLUCOSE 160*   < > 111* 113*  BUN 15   < > 16 21  CREATININE 1.38*   < > 1.60* 1.22  CALCIUM 9.0   < > 9.0 9.0  MG 2.3  --   --   --    < > = values in this interval not displayed.   Liver Function Tests Recent Labs    03/20/23 1638 03/22/23 1107  AST 22 27  ALT 17 18  ALKPHOS 40 40  BILITOT 0.8 1.0  PROT 6.6 7.0  ALBUMIN 3.3* 3.4*   No results for input(s): "LIPASE", "AMYLASE" in the last 72 hours. Cardiac Enzymes No results for input(s): "CKTOTAL", "CKMB", "CKMBINDEX", "TROPONINI" in the last  72 hours.  BNP: BNP (last 3 results) Recent Labs    03/07/23 1215 03/14/23 1942 03/20/23 1638  BNP 603.8* 812.0* 1,030.3*    ProBNP (last 3 results) No results for input(s): "PROBNP" in the last 8760 hours.   D-Dimer No results for input(s): "DDIMER" in the last 72 hours. Hemoglobin A1C No results for input(s): "HGBA1C" in the last 72 hours. Fasting Lipid Panel No results for input(s): "CHOL", "HDL", "LDLCALC", "TRIG", "CHOLHDL", "LDLDIRECT" in the last 72 hours. Thyroid Function Tests Recent Labs    03/20/23 1638  TSH 9.887*    Other results:   Imaging    NM Pulmonary Perfusion Result Date: 03/22/2023 CLINICAL DATA:  Chest pain and shortness of breath. Concern for pulmonary embolism. Status post thoracentesis. EXAM: NUCLEAR MEDICINE PERFUSION LUNG SCAN TECHNIQUE: Perfusion images were obtained in multiple projections after intravenous injection of  radiopharmaceutical. Ventilation scans intentionally deferred if perfusion scan and chest x-ray adequate for interpretation during COVID 19 epidemic. RADIOPHARMACEUTICALS:  3.8 mCi Tc-65m MAA IV COMPARISON:  Chest radiograph dated 03/21/2023. FINDINGS: Uniform perfusion of the lungs.  No perfusion defect. IMPRESSION: Normal perfusion scan. Electronically Signed   By: Elgie Collard M.D.   On: 03/22/2023 13:59   DG Swallowing Func-Speech Pathology Result Date: 03/22/2023 Table formatting from the original result was not included. Modified Barium Swallow Study Patient Details Name: Garrett Roberts MRN: [id number] Date of Birth: [birth year] Today's Date: 03/22/2023 HPI/PMH: HPI: 68 year old male PMHx [facility] 3[facility]10 with dyspnea, congestive heart failure. Underwent right heart cath 3/11; pulmonology following for right-sided pleural effusion on CT chest. Dx also includes FTT, concern for end stage HF but w/u continues. S/P right thoracentesis 3/11. MBS ordered to r/o chronic silent aspiration. Thirty pound unintentional weight loss since November. Followed by [facility] in January 2025 for epigastric pain - treated for H. Pylori. EGD recommended but deferred. PMHx hypertension, hyperlipidemia, diabetes, CAD, ischemic cardiomyopathy, congestive heart failure, atrial fibrillation, STEMI 2023, anterior STEMI 2024. Clinical Impression: Clinical Impression: Mr. Mckeithan presents with a functional oropharyngeal swallow.  There was adequate mastication and bolus control.  There was diminished base-of-tongue and pharyngeal contraction, leading to trace residue post-swallow (not enough to warrant concern for spillage into the larynx).  There was reliable laryngeal-vestibule closure with no penetration/aspiration.  There was partial distention of the PE opening.  An esophageal sweep revealed no obvious barium retention and a 13 mm barium pill traversed the esophagus swiftly. There were no obvious physiological  impairments that would support chronic aspiration.  Recommend continuing current diet, thin liquids. Meds whole with liquid. No further SLP f/u is needed. Pt watched the study in real time and results were discussed afterward. DIGEST Swallow Severity Rating*  Safety:  0  Efficiency:0  Overall Pharyngeal Swallow Severity: 0- normal 1: mild; 2: moderate; 3: severe; 4: profound *The Dynamic Imaging Grade of Swallowing Toxicity is standardized for the head and neck cancer population, however, demonstrates promising clinical applications across populations to standardize the clinical rating of pharyngeal swallow safety and severity. Factors that may increase risk of adverse event in presence of aspiration Rubye Oaks [employer] Clearance Coots 2021): No data recorded Recommendations/Plan: Swallowing Evaluation Recommendations Swallowing Evaluation Recommendations Recommendations: PO diet PO Diet Recommendation: Regular; Thin liquids (Level 0) Liquid Administration via: Cup; Straw Medication Administration: Whole meds with liquid Supervision: Patient able to self-feed Oral care recommendations: Oral care BID (2x/day) Treatment Plan Treatment Plan Treatment recommendations: No treatment recommended at this time Follow-up recommendations: No SLP follow up Recommendations Recommendations for follow  up therapy are one component of a multi-disciplinary discharge planning process, led by the attending physician.  Recommendations may be updated based on patient status, additional functional criteria and insurance authorization. Assessment: Orofacial Exam: Orofacial Exam Oral Cavity: Oral Hygiene: WFL Oral Cavity - Dentition: Missing dentition Orofacial Anatomy: WFL Anatomy: Anatomy: WFL Boluses Administered: Boluses Administered Boluses Administered: Thin liquids (Level 0); Mildly thick liquids (Level 2, nectar thick); Moderately thick liquids (Level 3, honey thick); Puree; Solid  Oral Impairment Domain: Oral Impairment Domain Lip Closure: No labial  escape Tongue control during bolus hold: Cohesive bolus between tongue to palatal seal Bolus preparation/mastication: Timely and efficient chewing and mashing Bolus transport/lingual motion: Brisk tongue motion Oral residue: Complete oral clearance Location of oral residue : N/A Initiation of pharyngeal swallow : Valleculae  Pharyngeal Impairment Domain: Pharyngeal Impairment Domain Soft palate elevation: No bolus between soft palate (SP)/pharyngeal wall (PW) Laryngeal elevation: Complete superior movement of thyroid cartilage with complete approximation of arytenoids to epiglottic petiole Anterior hyoid excursion: Complete anterior movement Epiglottic movement: Complete inversion Laryngeal vestibule closure: Complete, no air/contrast in laryngeal vestibule Pharyngeal stripping wave : Present - diminished Pharyngeal contraction (A/P view only): N/A Pharyngoesophageal segment opening: Partial distention/partial duration, partial obstruction of flow Tongue base retraction: Trace column of contrast or air between tongue base and PPW Pharyngeal residue: Trace residue within or on pharyngeal structures Location of pharyngeal residue: Valleculae; Pyriform sinuses  Esophageal Impairment Domain: Esophageal Impairment Domain Esophageal clearance upright position: Complete clearance, esophageal coating Pill: Pill Consistency administered: Thin liquids (Level 0) Thin liquids (Level 0): [facility] Penetration/Aspiration Scale Score: Penetration/Aspiration Scale Score 1.  Material does not enter airway: Thin liquids (Level 0); Mildly thick liquids (Level 2, nectar thick); Moderately thick liquids (Level 3, honey thick); Puree; Solid; Pill Compensatory Strategies: Compensatory Strategies Compensatory strategies: No   General Information: Caregiver present: No  Diet Prior to this Study: Regular; Thin liquids (Level 0)   Temperature : Normal   Respiratory Status: WFL   Supplemental O2: None (Room air)   History of Recent Intubation: No   Behavior/Cognition: Alert; Cooperative; Pleasant mood Self-Feeding Abilities: Able to self-feed Baseline vocal quality/speech: Normal Volitional Cough: Able to elicit Volitional Swallow: Able to elicit Exam Limitations: No limitations Goal Planning: No data recorded No data recorded No data recorded No data recorded No data recorded Pain: Pain Assessment Pain Assessment: No/denies pain End of Session: Start Time:SLP Start Time (ACUTE ONLY): 1012 Stop Time: SLP Stop Time (ACUTE ONLY): 1045 Time Calculation:SLP Time Calculation (min) (ACUTE ONLY): 33 min Charges: SLP Evaluations $ SLP Speech Visit: 1 Visit SLP Evaluations $MBS Swallow: 1 Procedure SLP visit diagnosis: SLP Visit Diagnosis: Dysphagia, oropharyngeal phase (R13.12) Past Medical History: Past Medical History: Diagnosis Date  Acute ST elevation myocardial infarction (STEMI) of anterior wall (HCC) 12/10/2022  Acute ST elevation myocardial infarction (STEMI) of inferior wall (HCC) 10/30/2021  Atrial fibrillation (HCC)   Atrial fibrillation with RVR (HCC) 12/19/2022  CHF (congestive heart failure) (HCC)   Coronary artery disease   Diabetes mellitus without complication (HCC)   DM (diabetes mellitus) (HCC)   H. pylori infection   Heart attack (HCC)   HLD (hyperlipidemia)   Hypertension   ST elevation myocardial infarction involving left anterior descending (LAD) coronary artery (HCC) 12/10/2022 Past Surgical History: Past Surgical History: Procedure Laterality Date  CARDIOVERSION N/A 12/14/2022  Procedure: CARDIOVERSION;  Surgeon: Little Ishikawa, MD;  Location: [facility] INVASIVE CV LAB;  Service: Cardiovascular;  Laterality: N/A;  CORONARY/GRAFT ACUTE MI REVASCULARIZATION N/A 10/30/2021  Procedure: Coronary/Graft Acute MI Revascularization;  Surgeon: Yates Decamp, MD;  Location: [facility] INVASIVE CV LAB;  Service: Cardiovascular;  Laterality: N/A;  CORONARY/GRAFT ACUTE MI REVASCULARIZATION N/A 12/10/2022  Procedure: Coronary/Graft Acute MI Revascularization;   Surgeon: Tonny Bollman, MD;  Location: [facility] INVASIVE CV LAB;  Service: Cardiovascular;  Laterality: N/A;  LEFT HEART CATH AND CORONARY ANGIOGRAPHY N/A 10/30/2021  Procedure: LEFT HEART CATH AND CORONARY ANGIOGRAPHY;  Surgeon: Yates Decamp, MD;  Location: [facility] INVASIVE CV LAB;  Service: Cardiovascular;  Laterality: N/A;  LEFT HEART CATH AND CORONARY ANGIOGRAPHY N/A 12/10/2022  Procedure: LEFT HEART CATH AND CORONARY ANGIOGRAPHY;  Surgeon: Tonny Bollman, MD;  Location: [facility] INVASIVE CV LAB;  Service: Cardiovascular;  Laterality: N/A;  RIGHT HEART CATH N/A 03/20/2023  Procedure: RIGHT HEART CATH;  Surgeon: Laurey Morale, MD;  Location: [facility] INVASIVE CV LAB;  Service: Cardiovascular;  Laterality: N/A; Carolan Shiver 03/22/2023, 11:09 AM Marchelle Folks L. Couture, MA CCC/SLP Clinical Specialist - Acute Care SLP Acute Rehabilitation Services Office number [phone]    Medications:     Scheduled Medications:  amiodarone  200 mg Oral Daily   Chlorhexidine Gluconate Cloth  6 each Topical Daily   clopidogrel  75 mg Oral Daily   digoxin  0.0625 mg Oral Daily   empagliflozin  10 mg Oral Daily   ezetimibe  10 mg Oral Daily   feeding supplement  237 mL Oral TID BM   insulin aspart  0-15 Units Subcutaneous TID WC   insulin aspart  0-5 Units Subcutaneous QHS   multivitamin with minerals  1 tablet Oral Daily   pantoprazole  40 mg Oral BID   polyethylene glycol  17 g Oral BID   rosuvastatin  40 mg Oral Daily   sildenafil  40 mg Oral TID   sodium chloride flush  3 mL Intravenous Q12H   sodium chloride flush  3 mL Intravenous Q12H   spironolactone  12.5 mg Oral Daily   torsemide  20 mg Oral q morning    Infusions:  sodium chloride 10 mL/hr at 03/22/23 0700    PRN Medications: sodium chloride, acetaminophen, ondansetron (ZOFRAN) IV, mouth rinse, sodium chloride flush, sodium chloride flush    Assessment/Plan   1. Acute on chronic systolic CHF/ischemic cardiomyopathy:  Echo 12/24 with EF 20-25%, no  LV thrombus, AK mid septum to apex, moderately reduced RV systolic function. Echo 2/25 with EF 25%, mildly reduced RV function. NYHA class IIIb-IV, primarily due to profound fatigue.  Low BP limits GDMT titration, SBP 80s-90s at home.  His profound fatigue has been concerning for low output HF. RHC 3/10 showed low CI by thermo (2.05) but preserved by Fick.  Borderline elevated PCWP, normal RA pressure.  There was also moderate mixed pulmonary arterial/pulmonary venous hypertension.  With his significant symptoms, he was started on milrinone 0.25 and added sildenafil to try to lower PA pressure. Swan left in place and hemodynamics improved. Milrinone weaned off 3/11. Swan removed. Co-ox remains stable today off milrinone at 67%. CVP 2  - Continue sildenafil 40 tid for moderate pulmonary arterial hypertension.   - wait to restart torsemide 20 mg daily after EGD and diet is resumed  - Continue Jardiance 10 mg daily. - Continue digoxin 0.0625 daily (use low dose with relatively small size and CKD stage 3).  - off Toprol XL with recent low output.  - Continue spironolactone 12.5 at bedtime.   - He needs ICD as outpatient.  He qualifies with persistently low EF.  Narrow QRS so  not CRT candidate.    2. CAD:  Hx inferior STEMI 10/23 s/p PCI/DES to RCA. Anterior STEMI 12/10/22, cath showed occluded pLAD treated with PCI/DES, prior RCA stent patent, severe disease OM1 and distal LCx treated medically. No chest pain.  - Continue Plavix and Eliquis.  - Continue rosuvastatin 40 mg + Zetia 10 mg daily. 3. Atrial fibrillation: Paroxysmal. NSR today.  - Eliquis on hold today for EGD - Continue PO amiodarone 200 mg daily 4. DM II: Continue Jardiance + SSI  5. 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 Not a smoker, no known lung disease. CXR has shown some chronic interstitial changes. HRCT chest with bibasilar bronchiectasis that may be due to aspiration.  V/Q scan  normal.  - Sildenafil as above.  6. Pleural effusion: Moderate on right. S/p right thoracentesis.   - fluid LDH elevated 71, total protein < 3.  Cytology with no malignant cells.   7. GI: Has had significant weight loss, early satiety, GI discomfort.  CT abdomen/pelvis with gastric wall thickening, ?gastritis but think need to rule out malignancy.   - GI now following, planning EGD today  - Continue PPI 8. Possible aspiration: Initially suspected Lung changes may be aspiration-related. However swallow study was unremarkable.  There was reliable laryngeal-vestibule closure with no penetration/aspiration   Length of Stay: 3  Brittainy Simmons, PA-C  03/23/2023, 7:30 AM  Advanced Heart Failure Team Pager [phone] (M-F; 7a - 5p)  Please contact [facility] for night-coverage after hours (5p -7a ) and weekends on [email or web address]  Patient seen with PA, I formulated the plan and agree with the above note.   CVP remains < 5, co-ox 67%.  He walked yesterday and felt better compared to prior to admission.   GNRs noted in pleural fluid. He is afebrile with normal WBCs.  EGD today, no definite evidence for malignancy, biopsies taken for H pylori testing and pathology.   V/Q scan with no evidence for chronic PE.   General: NAD Neck: No JVD, no thyromegaly or thyroid nodule.  Lungs: Clear to auscultation bilaterally with normal respiratory effort. CV: Nondisplaced PMI.  Heart regular S1/S2, no S3/S4, no murmur.  No peripheral edema.   Abdomen: Soft, nontender, no hepatosplenomegaly, no distention.  Skin: Intact without lesions or rashes.  Neurologic: Alert and oriented x 3.  Psych: Normal affect. Extremities: No clubbing or cyanosis.  HEENT: Normal.   Pleural fluid was transudative by Light's criteria, no malignant cells. However, it is growing GNRs.  - Start Cipro for now per ID, awaiting speciation.   GI signed off, suspect no gastric malignancy.  Continue PPI, await gastric pathology and  H pylori.   V/Q scan with no evidence for chronic PE.  He is now on sildenafil for PAH, will send serologic workup.   Restart Eliquis for PAF post-EGD.   Co-ox remains excellent off milrinone, CVP low.  Can restart lower dose of torsemide 20 mg daily.    I will await speciation of GNRs in pleural fluid and start Cipro, aim for home tomorrow.  Ambulate today.   Marca Ancona 03/23/2023 2:15 PM

## 2023-03-23 NOTE — TOC Progression Note (Addendum)
 Transition of Care [facility]) - Progression Note    Patient Details  Name: Garrett Roberts MRN: [id number] Date of Birth: [birth year]  [facility] CM/SW Contact  Nicanor Bake Phone Number: [phone] 03/23/2023, 9:59 AM  Clinical Narrative:   9:57 AM- HF CSW called the pts daughter and left a VM asking to be called back. CSW will continue to follow up and attempt to make contact.   1:58 PM- HF CSW called the pts daughter and left a VM asking to be called back. CSW will continue to follow up and attempt to make contact.   1:59 PM-  HF CSW called the pts WIFE and left a VM asking to be called back. CSW will continue to follow up and attempt to make contact.     TOC will continue following.     Expected Discharge Plan: Home/Self Care Barriers to Discharge: Continued Medical Work up  Expected Discharge Plan and Services       Living arrangements for the past 2 months: Single Family Home                                       Social Determinants of Health (SDOH) Interventions SDOH Screenings   Food Insecurity: No Food Insecurity (03/20/2023)  Housing: Low Risk  (03/20/2023)  Transportation Needs: No Transportation Needs (03/20/2023)  Utilities: Patient Declined (03/20/2023)  Alcohol Screen: Low Risk  (12/13/2022)  Financial Resource Strain: High Risk (12/13/2022)  Physical Activity: Sufficiently Active (11/27/2019)   Received from [facility]  Social Connections: Socially Integrated (03/20/2023)  Stress: Stress Concern Present (11/27/2019)   Received from [facility]  Tobacco Use: Low Risk  (03/23/2023)    Readmission Risk Interventions     No data to display

## 2023-03-23 NOTE — Op Note (Signed)
 [facility] Patient Name: Garrett Roberts Procedure Date : 03/23/2023 MRN: [id number] Attending MD: Dub Amis. Tomasa Rand , MD, [id number] Date of Birth: [birth year] CSN: [id number] Age: 68 Admit Type: Inpatient Procedure:                Upper GI endoscopy Indications:              Abnormal CT of the GI tract, Weight loss, history                            of H. pylori Providers:                Lorin Picket E. Tomasa Rand, MD, Stephens Shire RN, RN, Beryle Beams, Technician Referring MD:              Medicines:                Monitored Anesthesia Care Complications:            No immediate complications. Estimated Blood Loss:     Estimated blood loss was minimal. Procedure:                Pre-Anesthesia Assessment:                           - Prior to the procedure, a History and Physical                            was performed, and patient medications and                            allergies were reviewed. The patient's tolerance of                            previous anesthesia was also reviewed. The risks                            and benefits of the procedure and the sedation                            options and risks were discussed with the patient.                            All questions were answered, and informed consent                            was obtained. Prior Anticoagulants: The patient has                            taken Plavix (clopidogrel), last dose was 1 day                            prior to procedure. ASA Grade Assessment: IV - A  patient with severe systemic disease that is a                            constant threat to life. After reviewing the risks                            and benefits, the patient was deemed in                            satisfactory condition to undergo the procedure.                           After obtaining informed consent, the endoscope was                             passed under direct vision. Throughout the                            procedure, the patient's blood pressure, pulse, and                            oxygen saturations were monitored continuously. The                            [id number]-H190 ([id number]) Olympus endoscope was introduced                            through the mouth, and advanced to the second part                            of duodenum. The upper GI endoscopy was                            accomplished without difficulty. The patient                            tolerated the procedure well. Scope In: Scope Out: Findings:      The examined portions of the nasopharynx, oropharynx and larynx were       normal.      The examined esophagus was normal.      A 4 cm hiatal hernia was present.      Diffuse granular mucosa was found in the entire examined stomach.       Biopsies were taken with a cold forceps for Helicobacter pylori testing.       Estimated blood loss was minimal.      The exam of the stomach was otherwise normal.      The examined duodenum was normal. Impression:               - The examined portions of the nasopharynx,                            oropharynx and larynx were normal.                           -  Normal esophagus.                           - 4 cm hiatal hernia.                           - Granular gastric mucosa. Biopsied.                           - Normal examined duodenum.                           - No evidence of malignancy or significant                            gastritis.                           - Patient's poor appetite and weight loss may be                            related to severe heart failure. Moderate Sedation:      N/A Recommendation:           - Return patient to hospital ward for ongoing care.                           - Resume previous diet.                           - Continue present medications including Plavix and                            Eliquis.                            - Await pathology results. If H. pylori present,                            will need salvage therapy.                           - GI will sign off at this time. Please reconsult                            with additional questions/concerns. Procedure Code(s):        --- Professional ---                           [phone], Esophagogastroduodenoscopy, flexible,                            transoral; with biopsy, single or multiple Diagnosis Code(s):        --- Professional ---                           K44.9, Diaphragmatic hernia without obstruction or  gangrene                           K31.89, Other diseases of stomach and duodenum                           R63.4, Abnormal weight loss                           R93.3, Abnormal findings on diagnostic imaging of                            other parts of digestive tract CPT copyright 2022 American Medical Association. All rights reserved. The codes documented in this report are preliminary and upon coder review may  be revised to meet current compliance requirements. Danilo Cappiello E. Tomasa Rand, MD 03/23/2023 11:28:31 AM This report has been signed electronically. Number of Addenda: 0

## 2023-03-23 NOTE — Interval H&P Note (Signed)
 History and Physical Interval Note:  03/23/2023 10:37 AM  Garrett Roberts  has presented today for surgery, with the diagnosis of abnormal CT.  The various methods of treatment have been discussed with the patient and family. After consideration of risks, benefits and other options for treatment, the patient has consented to  Procedure(s): EGD (ESOPHAGOGASTRODUODENOSCOPY) (N/A) as a surgical intervention.  The patient's history has been reviewed, patient examined, no change in status, stable for surgery.  I have reviewed the patient's chart and labs.  Questions were answered to the patient's satisfaction.     Jenel Lucks

## 2023-03-24 ENCOUNTER — Other Ambulatory Visit (HOSPITAL_COMMUNITY): Payer: Self-pay | Admitting: Cardiology

## 2023-03-24 ENCOUNTER — Other Ambulatory Visit (HOSPITAL_COMMUNITY): Payer: Self-pay

## 2023-03-24 DIAGNOSIS — I5022 Chronic systolic (congestive) heart failure: Secondary | ICD-10-CM

## 2023-03-24 LAB — CENTROMERE ANTIBODIES: Centromere Ab Screen: <0.2 AI (ref 0.0–0.9)

## 2023-03-24 LAB — BASIC METABOLIC PANEL
Anion gap: 8 (ref 5–15)
BUN: 28 mg/dL — ABNORMAL HIGH (ref 8–23)
CO2: 25 mmol/L (ref 22–32)
Calcium: 9 mg/dL (ref 8.9–10.3)
Chloride: 102 mmol/L (ref 98–111)
Creatinine, Ser: 1.27 mg/dL — ABNORMAL HIGH (ref 0.61–1.24)
GFR, Estimated: >60 mL/min (ref >60)
Glucose, Bld: 131 mg/dL — ABNORMAL HIGH (ref 70–99)
Potassium: 4.1 mmol/L (ref 3.5–5.1)
Sodium: 135 mmol/L (ref 135–145)

## 2023-03-24 LAB — COOXEMETRY PANEL
Carboxyhemoglobin: 1.3 % (ref 0.5–1.5)
Methemoglobin: <0.7 % (ref 0.0–1.5)
O2 Saturation: 64.6 %
Total hemoglobin: 13.4 g/dL (ref 12.0–16.0)

## 2023-03-24 LAB — GLUCOSE, CAPILLARY
Glucose-Capillary: 147 mg/dL — ABNORMAL HIGH (ref 70–99)
Glucose-Capillary: 250 mg/dL — ABNORMAL HIGH (ref 70–99)

## 2023-03-24 LAB — ANA W/REFLEX IF POSITIVE: Anti Nuclear Antibody (ANA): NEGATIVE

## 2023-03-24 LAB — ANTI-SCLERODERMA ANTIBODY: Scleroderma (Scl-70) (ENA) Antibody, IgG: 0.4 AI (ref 0.0–0.9)

## 2023-03-24 LAB — DIGOXIN LEVEL: Digoxin Level: <0.2 ng/mL — ABNORMAL LOW (ref 0.8–2.0)

## 2023-03-24 MED ORDER — SILDENAFIL CITRATE 20 MG PO TABS
40.0000 mg | ORAL_TABLET | Freq: Three times a day (TID) | ORAL | 5 refills | Status: DC
  Filled 2023-03-24: qty 180, 30d supply, fill #0

## 2023-03-24 MED ORDER — TORSEMIDE 20 MG PO TABS
20.0000 mg | ORAL_TABLET | Freq: Every morning | ORAL | 5 refills | Status: DC
  Filled 2023-03-24: qty 30, 30d supply, fill #0
  Filled 2023-04-18: qty 30, 30d supply, fill #1

## 2023-03-24 MED ORDER — DIGOXIN 62.5 MCG PO TABS
0.0625 mg | ORAL_TABLET | Freq: Every day | ORAL | 5 refills | Status: DC
  Filled 2023-03-24: qty 30, 30d supply, fill #0

## 2023-03-24 MED ORDER — AMIODARONE HCL 200 MG PO TABS
200.0000 mg | ORAL_TABLET | Freq: Every day | ORAL | 5 refills | Status: DC
  Filled 2023-03-24: qty 30, 30d supply, fill #0
  Filled 2023-04-18: qty 30, 30d supply, fill #1
  Filled 2023-05-19 – 2023-06-02 (×2): qty 30, 30d supply, fill #2

## 2023-03-24 MED ORDER — CIPROFLOXACIN HCL 750 MG PO TABS
750.0000 mg | ORAL_TABLET | Freq: Two times a day (BID) | ORAL | 0 refills | Status: DC
  Filled 2023-03-24: qty 20, 10d supply, fill #0

## 2023-03-24 NOTE — Progress Notes (Signed)
 Referral placed for ICD. EP contacted to arrange outpatient consultation.   Robbie Lis, PA-C

## 2023-03-24 NOTE — TOC CM/SW Note (Signed)
  MATCH MEDICATION ASSISTANCE CARD   Pharmacies please call [phone] for claim processing assistance.   Rx BIN: [id number]   Rx Group: [id number]   Rx PCN: [id number]   Relationship Code: 1   Person Code: 01   Patient ID (MRN): [id number]      Patient Name: Garrett Roberts    Patient DOB: 1955-04-27   Discharge Date: 03/24/2023  Expiration Date: (must be filled within 7 days of discharge)

## 2023-03-24 NOTE — Progress Notes (Signed)
 Went over AVS paperwork with any/all questions answered, TOC meds given, gathered belongings and patient was wheeled out to family vehicle.

## 2023-03-24 NOTE — Progress Notes (Signed)
 R internal jugular  and two IV's removed. On bedrest until 1415. Patient requested daughter hear discharge instructions, tried calling daughter with no answer. He will try again.

## 2023-03-24 NOTE — TOC Transition Note (Addendum)
 Transition of Care [facility]) - Discharge Note   Patient Details  Name: Garrett Roberts MRN: [id number] Date of Birth: 10-Dec-1955  [facility] CM/SW Contact:  Elliot Cousin, RN Phone Number: [phone] 03/24/2023, 11:27 AM   Clinical Narrative:     TOC CM spoke to pt and states he was approved for Medicare, he is waiting for his card and will receive his first check next month. Pharmacy unable to locate Medicare benefits in system. MATCH for medications. Explained to pt he will have a $3 copay. States he has scale at home for daily weights. Has Living Better with HF booklet at home. Will need PCP follow up appt.    [facility] to establish with PCP on 05/15/2023 at 12 pm, earliest appt for their clinic. They will put him on a waiting list for earlier appt  [facility] and hospital follow up appts are out to May across system. Will update pt to keep appt on May 5. Pt has an appt with AHF clinic on 03/30/2023.    Final next level of care: Home/Self Care Barriers to Discharge: No Barriers Identified   Patient Goals and CMS Choice Patient states their goals for this hospitalization and ongoing recovery are:: get healthy          Discharge Placement                       Discharge Plan and Services Additional resources added to the After Visit Summary for     Discharge Planning Services: CM Consult                                 Social Drivers of Health (SDOH) Interventions SDOH Screenings   Food Insecurity: No Food Insecurity (03/20/2023)  Housing: Low Risk  (03/20/2023)  Transportation Needs: No Transportation Needs (03/20/2023)  Utilities: Patient Declined (03/20/2023)  Alcohol Screen: Low Risk  (12/13/2022)  Financial Resource Strain: High Risk (12/13/2022)  Physical Activity: Sufficiently Active (11/27/2019)   Received from [facility], [facility]  Social Connections: Socially Integrated (03/20/2023)   Stress: Stress Concern Present (11/27/2019)   Received from [facility], [facility]  Tobacco Use: Low Risk  (03/23/2023)     Readmission Risk Interventions     No data to display

## 2023-03-24 NOTE — Progress Notes (Addendum)
 Patient ID: Garrett Roberts, male   DOB: 1955/05/01, 68 y.o.   MRN: [id number]     Advanced Heart Failure Rounding Note  Cardiologist: Elder Negus, MD  Chief Complaint: CHF Subjective:    Co-ox remains stable off milrinone, 65%. CVP not connected to CVC  SCr stable 1.27, SBPs remains soft upper 80s-low 90s.   Now on Cipro for GNR in pleural fluid. Awaiting speciation   Rheumatoid factor still pending but all other serologies negative for PH w/u.   OOB, sitting up in chair. Overall feels better. Denies resting and exertional dyspnea. No lightheadedness w/ standing.      RHC Procedural Findings (3/10): Hemodynamics (mmHg) RA mean 2 RV 58/5 PA 60/18, mean 31 PCWP mean 17 Oxygen saturations: PA 70% AO 99% Cardiac Output (Thermo) 3.81  Cardiac Index (Thermo) 2.05 PVR 3.7 WU Cardiac Output (Fick) 4.68  Cardiac Index (Fick) 2.52  PVR 3 WU PAPi 21   Objective:   Weight Range: 65.7 kg Body mass index is 19.64 kg/m.   Vital Signs:   Temp:  [97.4 F (36.3 C)-98.4 F (36.9 C)] 98.2 F (36.8 C) (03/14 0501) Pulse Rate:  [63-76] 67 (03/14 0527) Resp:  [10-23] 18 (03/14 0501) BP: (79-102)/(54-66) 98/63 (03/14 0501) SpO2:  [96 %-100 %] 98 % (03/14 0527) Weight:  [65.7 kg] 65.7 kg (03/14 0527) Last BM Date : 03/19/23  Weight change: Filed Weights   03/22/23 0500 03/23/23 0539 03/24/23 0527  Weight: 67.4 kg 66.6 kg 65.7 kg    Intake/Output:   Intake/Output Summary (Last 24 hours) at 03/24/2023 0750 Last data filed at 03/24/2023 0533 Gross per 24 hour  Intake 661.5 ml  Output 450 ml  Net 211.5 ml      Physical Exam    General:  Well appearing, thin male. No respiratory difficulty HEENT: normal Neck: supple. RIJ CVC, JVD not elevated. Carotids 2+ bilat; no bruits. No lymphadenopathy or thyromegaly appreciated. Cor: PMI nondisplaced. Regular rate & rhythm. No rubs, gallops or murmurs. Lungs: clear Abdomen: soft, nontender, nondistended. No  hepatosplenomegaly. No bruits or masses. Good bowel sounds. Extremities: no cyanosis, clubbing, rash, edema Neuro: alert & oriented x 3, cranial nerves grossly intact. moves all 4 extremities w/o difficulty. Affect pleasant.   Telemetry   NSR 80s (personally reviewed)   Labs    CBC No results for input(s): "WBC", "NEUTROABS", "HGB", "HCT", "MCV", "PLT" in the last 72 hours.  Basic Metabolic Panel Recent Labs    40/98/11 0553 03/24/23 0505  NA 134* 135  K 4.3 4.1  CL 100 102  CO2 25 25  GLUCOSE 113* 131*  BUN 21 28*  CREATININE 1.22 1.27*  CALCIUM 9.0 9.0   Liver Function Tests Recent Labs    03/22/23 1107  AST 27  ALT 18  ALKPHOS 40  BILITOT 1.0  PROT 7.0  ALBUMIN 3.4*   No results for input(s): "LIPASE", "AMYLASE" in the last 72 hours. Cardiac Enzymes No results for input(s): "CKTOTAL", "CKMB", "CKMBINDEX", "TROPONINI" in the last 72 hours.  BNP: BNP (last 3 results) Recent Labs    03/07/23 1215 03/14/23 1942 03/20/23 1638  BNP 603.8* 812.0* 1,030.3*    ProBNP (last 3 results) No results for input(s): "PROBNP" in the last 8760 hours.   D-Dimer No results for input(s): "DDIMER" in the last 72 hours. Hemoglobin A1C No results for input(s): "HGBA1C" in the last 72 hours. Fasting Lipid Panel No results for input(s): "CHOL", "HDL", "LDLCALC", "TRIG", "CHOLHDL", "LDLDIRECT" in the last 72 hours. Thyroid  Function Tests No results for input(s): "TSH", "T4TOTAL", "T3FREE", "THYROIDAB" in the last 72 hours.  Invalid input(s): "FREET3"   Other results:   Imaging    No results found.    Medications:     Scheduled Medications:  amiodarone  200 mg Oral Daily   apixaban  5 mg Oral BID   Chlorhexidine Gluconate Cloth  6 each Topical Daily   ciprofloxacin  750 mg Oral BID   clopidogrel  75 mg Oral Daily   digoxin  0.0625 mg Oral Daily   empagliflozin  10 mg Oral Daily   ezetimibe  10 mg Oral Daily   feeding supplement  237 mL Oral TID BM    insulin aspart  0-15 Units Subcutaneous TID WC   insulin aspart  0-5 Units Subcutaneous QHS   multivitamin with minerals  1 tablet Oral Daily   pantoprazole  40 mg Oral BID   polyethylene glycol  17 g Oral BID   rosuvastatin  40 mg Oral Daily   sildenafil  40 mg Oral TID   sodium chloride flush  3 mL Intravenous Q12H   sodium chloride flush  3 mL Intravenous Q12H   spironolactone  12.5 mg Oral Daily   torsemide  20 mg Oral q morning    Infusions:  sodium chloride 10 mL/hr at 03/24/23 0533    PRN Medications: sodium chloride, acetaminophen, ondansetron (ZOFRAN) IV, mouth rinse, sodium chloride flush, sodium chloride flush    Assessment/Plan   1. 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 Echo 2/25 with EF 25%, mildly reduced RV function. NYHA class IIIb-IV, primarily due to profound fatigue.  Low BP limits GDMT titration, SBP 80s-90s at home.  His profound fatigue has been concerning for low output HF. RHC 3/10 showed low CI by thermo (2.05) but preserved by Fick.  Borderline elevated PCWP, normal RA pressure.  There was also moderate mixed pulmonary arterial/pulmonary venous hypertension.  With his significant symptoms, he was started on milrinone 0.25 and added sildenafil to try to lower PA pressure. Swan left in place and hemodynamics improved. Milrinone weaned off 3/11. Swan removed. Co-ox remains stable today off milrinone at 65%. Euvolemic on exam. SBPs remain soft 90s systolic.  - Continue sildenafil 40 tid for moderate pulmonary arterial hypertension.   - continue torsemide 20 mg daily  - Continue Jardiance 10 mg daily. - Continue digoxin 0.0625 daily (use low dose with relatively small size and CKD stage 3).  - off Toprol XL with recent low output.  - Continue spironolactone 12.5 at bedtime.   - He needs ICD as outpatient.  He qualifies with persistently low  EF.  Narrow QRS so not CRT candidate.    2. CAD:  Hx inferior STEMI 10/23 s/p PCI/DES to RCA. Anterior STEMI 12/10/22, cath showed occluded pLAD treated with PCI/DES, prior RCA stent patent, severe disease OM1 and distal LCx treated medically. No chest pain.  - Continue Plavix and Eliquis.  - Continue rosuvastatin 40 mg + Zetia 10 mg daily. 3. Atrial fibrillation: Paroxysmal. NSR today.  - continue Eliquis 5 mg bid  - Continue PO amiodarone 200 mg daily 4. DM II: Continue Jardiance + SSI  5. 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 Not a smoker, no known lung disease. CXR has shown some chronic interstitial changes. HRCT chest with bibasilar bronchiectasis that may be due to aspiration.  V/Q scan normal.  Rheumatoid factor still pending but all other serologies negative - Sildenafil as above.  6. Pleural effusion: Moderate on right. S/p right thoracentesis. Pleural fluid was transudative by Light's criteria, no malignant cells. However, it is growing GNRs. Started on Cipro 3/13. Awaiting speciation - continue Cipro for now. Will adjust abx if necessary pending susceptibilities  7. GI: Has had significant weight loss, early satiety, GI discomfort.  CT abdomen/pelvis with gastric wall thickening. GI was consulted and EGD was performed, no definite evidence for malignancy, biopsies taken for H pylori testing and pathology. ? If pt's poor appetite and weight loss may be related to severe heart failure - Continue PPI - GI will f/u on path results, needs outpatient f/u  8. Possible aspiration: Initially suspected Lung changes may be aspiration-related. However swallow study was unremarkable.  There was reliable laryngeal-vestibule closure with no penetration/aspiration   Anticipate d/c home today. MD to assess first.   Length of Stay: [address] PA-C  03/24/2023, 7:50 AM  Advanced Heart Failure Team Pager [phone] (M-F; 7a - 5p)  Please  contact [facility] for night-coverage after hours (5p -7a ) and weekends on [email or web address]  Patient seen with PA, I formulated the plan and agree with the above note.   Co-ox 65% today, feels good.  Walked in hall without dyspnea.  No cough/chest pain, no fever.  GNRs in pleural fluid still not speciated.   General: NAD Neck: No JVD, no thyromegaly or thyroid nodule.  Lungs: Clear to auscultation bilaterally with normal respiratory effort. CV: Nondisplaced PMI.  Heart regular S1/S2, no S3/S4, no murmur.  No peripheral edema.    Abdomen: Soft, nontender, no hepatosplenomegaly, no distention.  Skin: Intact without lesions or rashes.  Neurologic: Alert and oriented x 3.  Psych: Normal affect. Extremities: No clubbing or cyanosis.  HEENT: Normal.   OK for home today.  No fever, does not appear septic.  Will cover with 10 days of Cipro for GNRs in pleural fluid.    Followup with me and will need EP appointment. Meds for home: Cipro x 10 day course, sildenafil 40 tid, amiodarone 200 daily, Eliquis 5 bid, Crestor 40, Zetia 10, spironolactone 12.5 at bedtime, Plavix 75, digoxin 0.0625, torsemide 20 daily, Jardiance 10 daily.   Marca Ancona 03/24/2023 11:43 AM

## 2023-03-24 NOTE — Progress Notes (Signed)
 Mobility Specialist Progress Note;   03/24/23 1011  Mobility  Activity Ambulated independently in hallway  Level of Assistance Modified independent, requires aide device or extra time  Assistive Device None  Distance Ambulated (ft) 400 ft  Activity Response Tolerated well  Mobility Referral Yes  Mobility visit 1 Mobility  Mobility Specialist Start Time (ACUTE ONLY) 1011  Mobility Specialist Stop Time (ACUTE ONLY) 1018  Mobility Specialist Time Calculation (min) (ACUTE ONLY) 7 min   Pt agreeable to mobility. Required no physical assistance during ambulation, ModI. VSS throughout and no c/o when asked. Pt returned back to chair with all needs met.   Caesar Bookman Mobility Specialist Please contact via SecureChat or [employer] [phone]

## 2023-03-24 NOTE — Progress Notes (Signed)
 Dr. Orson Aloe paged and made aware of bp 86/56 (66), asymptomatic.  Per Dr. Orson Aloe keep MAP greater than 65.  No new orders.

## 2023-03-25 ENCOUNTER — Other Ambulatory Visit (HOSPITAL_COMMUNITY): Payer: Self-pay

## 2023-03-25 ENCOUNTER — Telehealth: Payer: Self-pay | Admitting: Internal Medicine

## 2023-03-25 DIAGNOSIS — J90 Pleural effusion, not elsewhere classified: Secondary | ICD-10-CM

## 2023-03-25 LAB — BODY FLUID CULTURE W GRAM STAIN: Gram Stain: NONE SEEN

## 2023-03-25 LAB — RHEUMATOID FACTOR: Rheumatoid fact SerPl-aCnc: <10 [IU]/mL (ref <14.0)

## 2023-03-25 MED ORDER — SULFAMETHOXAZOLE-TRIMETHOPRIM 800-160 MG PO TABS: 1.0000 | ORAL_TABLET | Freq: Two times a day (BID) | ORAL | Status: DC

## 2023-03-25 MED ORDER — SULFAMETHOXAZOLE-TRIMETHOPRIM 800-160 MG PO TABS
1.0000 | ORAL_TABLET | Freq: Two times a day (BID) | ORAL | 0 refills | Status: AC
Start: 2023-03-25 — End: 2023-04-01
  Filled 2023-03-25 (×2): qty 14, 7d supply, fill #0

## 2023-03-25 NOTE — Telephone Encounter (Signed)
 03/25/2023 E coli results noted DC cipro Rx bactrim x 1 week D/w AHF  Myrla Halsted MD PCCM

## 2023-03-25 NOTE — Addendum Note (Signed)
 Addended by: Levon Hedger on: 03/25/2023 03:07 PM   Modules accepted: Orders

## 2023-03-25 NOTE — Addendum Note (Signed)
 Addended by: Levon Hedger on: 03/25/2023 03:11 PM   Modules accepted: Orders

## 2023-03-27 ENCOUNTER — Encounter (HOSPITAL_COMMUNITY): Payer: Self-pay | Admitting: Gastroenterology

## 2023-03-27 ENCOUNTER — Telehealth (HOSPITAL_COMMUNITY): Payer: Self-pay | Admitting: *Deleted

## 2023-03-27 LAB — SURGICAL PATHOLOGY

## 2023-03-27 NOTE — Telephone Encounter (Signed)
 Called patient per Dr. Shirlee Latch with following CT results:  "No ILD, 4.3 cm ascending aorta."  Pt verbalized understanding of above. No further questions at this time.

## 2023-03-28 ENCOUNTER — Telehealth (HOSPITAL_COMMUNITY): Payer: Self-pay | Admitting: *Deleted

## 2023-03-28 NOTE — Telephone Encounter (Signed)
 Called patient per Dr. Shirlee Latch with following stomach biopsy results:  "No cancer on path, H pylori negative"  Pt verbalized understanding of same. No further questions at this time.

## 2023-03-29 ENCOUNTER — Encounter: Payer: Self-pay | Admitting: Gastroenterology

## 2023-03-29 NOTE — Progress Notes (Signed)
 Garrett Roberts,  The biopsies taken from your stomach were notable for mild chronic gastritis (inflammation) which is a common finding, but there was no evidence of Helicobacter pylori infection. This common finding is not likely to explain your symptoms and there is no specific treatment or further evaluation recommended.  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  This finding is thought to represent an increased risk of stomach cancer in some patients.  Patients at highest risk are those patients with a family history of stomach cancer and those with a smoking history, as well as patients who are from areas with high rates of stomach cancer. Periodic surveillance (repeat endoscopy with biopsies) is often performed to monitor for development of cancer or further precancerous changes.  Given the low risk of stomach cancer associated with this finding, and your significant heart disease and high risk of complications from sedation, I would recommend against routine endoscopic surveillance.

## 2023-03-30 ENCOUNTER — Other Ambulatory Visit (HOSPITAL_COMMUNITY): Payer: Self-pay

## 2023-03-30 ENCOUNTER — Encounter (HOSPITAL_COMMUNITY): Payer: Self-pay | Admitting: Cardiology

## 2023-03-30 ENCOUNTER — Ambulatory Visit (HOSPITAL_COMMUNITY)
Admission: RE | Admit: 2023-03-30 | Discharge: 2023-03-30 | Disposition: A | Discharge status: Home / Self Care | Payer: MEDICAID | Source: Ambulatory Visit | Attending: Internal Medicine | Admitting: Internal Medicine

## 2023-03-30 ENCOUNTER — Ambulatory Visit (HOSPITAL_COMMUNITY)
Admission: RE | Admit: 2023-03-30 | Discharge: 2023-03-30 | Discharge status: Home / Self Care | Payer: Self-pay | Source: Ambulatory Visit | Attending: Cardiology | Admitting: Cardiology

## 2023-03-30 VITALS — BP 94/58 | HR 71 | Wt 148.6 lb

## 2023-03-30 DIAGNOSIS — R5383 Other fatigue: Secondary | ICD-10-CM | POA: Insufficient documentation

## 2023-03-30 DIAGNOSIS — I5022 Chronic systolic (congestive) heart failure: Secondary | ICD-10-CM

## 2023-03-30 DIAGNOSIS — E785 Hyperlipidemia, unspecified: Secondary | ICD-10-CM | POA: Insufficient documentation

## 2023-03-30 DIAGNOSIS — I480 Paroxysmal atrial fibrillation: Secondary | ICD-10-CM | POA: Insufficient documentation

## 2023-03-30 DIAGNOSIS — I083 Combined rheumatic disorders of mitral, aortic and tricuspid valves: Secondary | ICD-10-CM | POA: Diagnosis not present

## 2023-03-30 DIAGNOSIS — E119 Type 2 diabetes mellitus without complications: Secondary | ICD-10-CM | POA: Diagnosis not present

## 2023-03-30 DIAGNOSIS — R634 Abnormal weight loss: Secondary | ICD-10-CM | POA: Diagnosis not present

## 2023-03-30 DIAGNOSIS — I252 Old myocardial infarction: Secondary | ICD-10-CM | POA: Diagnosis not present

## 2023-03-30 DIAGNOSIS — I429 Cardiomyopathy, unspecified: Secondary | ICD-10-CM | POA: Diagnosis not present

## 2023-03-30 DIAGNOSIS — Z79899 Other long term (current) drug therapy: Secondary | ICD-10-CM | POA: Insufficient documentation

## 2023-03-30 DIAGNOSIS — Z7901 Long term (current) use of anticoagulants: Secondary | ICD-10-CM | POA: Diagnosis not present

## 2023-03-30 DIAGNOSIS — Z955 Presence of coronary angioplasty implant and graft: Secondary | ICD-10-CM | POA: Diagnosis not present

## 2023-03-30 DIAGNOSIS — Z7902 Long term (current) use of antithrombotics/antiplatelets: Secondary | ICD-10-CM | POA: Insufficient documentation

## 2023-03-30 DIAGNOSIS — Z6820 Body mass index (BMI) 20.0-20.9, adult: Secondary | ICD-10-CM | POA: Insufficient documentation

## 2023-03-30 DIAGNOSIS — Z7984 Long term (current) use of oral hypoglycemic drugs: Secondary | ICD-10-CM | POA: Insufficient documentation

## 2023-03-30 DIAGNOSIS — I110 Hypertensive heart disease with heart failure: Secondary | ICD-10-CM | POA: Insufficient documentation

## 2023-03-30 DIAGNOSIS — I272 Pulmonary hypertension, unspecified: Secondary | ICD-10-CM | POA: Diagnosis not present

## 2023-03-30 DIAGNOSIS — I2510 Atherosclerotic heart disease of native coronary artery without angina pectoris: Secondary | ICD-10-CM | POA: Diagnosis not present

## 2023-03-30 DIAGNOSIS — Z006 Encounter for examination for normal comparison and control in clinical research program: Secondary | ICD-10-CM

## 2023-03-30 DIAGNOSIS — I255 Ischemic cardiomyopathy: Secondary | ICD-10-CM | POA: Insufficient documentation

## 2023-03-30 LAB — ECHOCARDIOGRAM COMPLETE
AR max vel: 2.18 cm2
AV Area VTI: 2.07 cm2
AV Area mean vel: 2.02 cm2
AV Mean grad: 4.9 mmHg
AV Peak grad: 9.6 mmHg
Ao pk vel: 1.55 m/s
Area-P 1/2: 4.21 cm2
Calc EF: 32.1 %
MV VTI: 1.4 cm2
S' Lateral: 4.4 cm
Single Plane A2C EF: 32.7 %
Single Plane A4C EF: 31.1 %

## 2023-03-30 LAB — COMPREHENSIVE METABOLIC PANEL
ALT: 22 U/L (ref 0–44)
AST: 29 U/L (ref 15–41)
Albumin: 3.9 g/dL (ref 3.5–5.0)
Alkaline Phosphatase: 50 U/L (ref 38–126)
Anion gap: 10 (ref 5–15)
BUN: 21 mg/dL (ref 8–23)
CO2: 25 mmol/L (ref 22–32)
Calcium: 9.6 mg/dL (ref 8.9–10.3)
Chloride: 100 mmol/L (ref 98–111)
Creatinine, Ser: 1.64 mg/dL — ABNORMAL HIGH (ref 0.61–1.24)
GFR, Estimated: 46 mL/min — ABNORMAL LOW (ref >60)
Glucose, Bld: 164 mg/dL — ABNORMAL HIGH (ref 70–99)
Potassium: 4.4 mmol/L (ref 3.5–5.1)
Sodium: 135 mmol/L (ref 135–145)
Total Bilirubin: 0.5 mg/dL (ref 0.0–1.2)
Total Protein: 7.4 g/dL (ref 6.5–8.1)

## 2023-03-30 LAB — BRAIN NATRIURETIC PEPTIDE: B Natriuretic Peptide: 800.5 pg/mL — ABNORMAL HIGH (ref 0.0–100.0)

## 2023-03-30 LAB — TSH: TSH: 11.518 u[IU]/mL — ABNORMAL HIGH (ref 0.350–4.500)

## 2023-03-30 MED ORDER — DIGOXIN 125 MCG PO TABS
0.1250 mg | ORAL_TABLET | Freq: Every day | ORAL | 3 refills | Status: DC
  Filled 2023-03-30: qty 90, 90d supply, fill #0

## 2023-03-30 MED ORDER — SPIRONOLACTONE 25 MG PO TABS
25.0000 mg | ORAL_TABLET | Freq: Every evening | ORAL | 3 refills | Status: DC
  Filled 2023-03-30: qty 90, 90d supply, fill #0

## 2023-03-30 MED ORDER — TADALAFIL 20 MG PO TABS
40.0000 mg | ORAL_TABLET | Freq: Every day | ORAL | 3 refills | Status: DC
  Filled 2023-03-30: qty 90, 45d supply, fill #0
  Filled 2023-05-19: qty 90, 45d supply, fill #1

## 2023-03-30 NOTE — Patient Instructions (Addendum)
 INCREASE Digoxin to daily.  INCREASE Spironolactone to 25 mg nightly.   STOP Sildenafil  START Tadalafil 40 mg daily.  Labs done today, your results will be available in MyChart, we will contact you for abnormal readings.  Repeat blood work in 10 days.  You have been referred to the electrophysiologist. They will call you to arrange your appointment.  Your physician recommends that you schedule a follow-up appointment in: 1 month.  If you have any questions or concerns before your next appointment please send [place] a message through [place] or call our office at [phone].    TO LEAVE A MESSAGE FOR THE NURSE SELECT OPTION 2, PLEASE LEAVE A MESSAGE INCLUDING: YOUR NAME DATE OF BIRTH CALL BACK NUMBER REASON FOR CALL**this is important as we prioritize the call backs  YOU WILL RECEIVE A CALL BACK THE SAME DAY AS LONG AS YOU CALL BEFORE 4:00 PM  At the [facility], you and your health needs are our priority. As part of our continuing mission to provide you with exceptional heart care, we have created designated Provider Care Teams. These Care Teams include your primary Cardiologist (physician) and Advanced Practice Providers (APPs- Physician Assistants and Nurse Practitioners) who all work together to provide you with the care you need, when you need it.   You may see any of the following providers on your designated Care Team at your next follow up: Dr Arvilla Meres Dr Marca Ancona Dr. Dorthula Nettles Dr. Clearnce Hasten Amy Filbert Schilder, NP Robbie Lis, [place] [facility] [place], [place] Brynda Peon, NP [place] Lee, NP Clarisa Kindred, NP Karle Plumber, PharmD Enos Fling, PharmD   Please be sure to bring in all your medications bottles to every appointment.    Thank you for choosing  [facility]

## 2023-03-30 NOTE — Research (Signed)
 SITE: [id number]     Subject # [id number]  Subprotocol: A  Inclusion Criteria  Patients who meet all of the following criteria are eligible for enrollment as study participants:  Yes No  Age > 68 years old X   Eligible to wear Holter Study X    Exclusion Criteria  Patients who meet any of these criteria are not eligible for enrollment as study participants: Yes No  1. Receiving any mechanical (respiratory or circulatory) or renal support therapy at Screening or during Visit #1.  X  2.  Any other conditions that in the opinion of the investigators are likely to prevent compliance with the study protocol or pose a safety concern if the subject participates in the study.  X  3. Poor tolerance, namely susceptible to severe skin allergies from ECG adhesive patch application.  X   Protocol: REV H                                     Residential Zip code [phone] (First 3 digits ONLY)                                             [facility] Informed Consent   Subject Name: Garrett Roberts  Subject met inclusion and exclusion criteria.  The informed consent form, study requirements and expectations were reviewed with the subject. Subject had opportunity to read consent and questions and concerns were addressed prior to the signing of the consent form.  The subject verbalized understanding of the trial requirements.  The subject agreed to participate in the [facility] EF ACT trial and signed the informed consent at 14:39 on 30-Mar-2023.  The informed consent was obtained prior to performance of any protocol-specific procedures for the subject.  A copy of the signed informed consent was given to the subject and a copy was placed in the subject's medical record.   Dyanne Iha          Current Outpatient Medications:    amiodarone (PACERONE) 200 MG tablet, Take 1 tablet (200 mg total) by mouth daily., Disp: 30 tablet, Rfl: 5   apixaban (ELIQUIS) 5 MG TABS tablet, Take 1 tablet (5 mg total) by mouth 2 (two)  times daily., Disp: , Rfl:    Ascorbic Acid (VITAMIN C PO), Take 1 tablet by mouth daily with lunch., Disp: , Rfl:    clopidogrel (PLAVIX) 75 MG tablet, Take 1 tablet (75 mg total) by mouth daily., Disp: 30 tablet, Rfl: 2   Cyanocobalamin (B-12 PO), Take 1 tablet by mouth daily with lunch., Disp: , Rfl:    digoxin (LANOXIN) 0.125 MG tablet, Take 1 tablet (0.125 mg total) by mouth daily., Disp: 90 tablet, Rfl: 3   empagliflozin (JARDIANCE) 10 MG TABS tablet, Take 1 tablet (10 mg total) by mouth daily before breakfast., Disp: , Rfl:    ezetimibe (ZETIA) 10 MG tablet, Take 1 tablet (10 mg total) by mouth daily., Disp: 30 tablet, Rfl: 2   MAGNESIUM PO, Take 1 tablet by mouth at bedtime., Disp: , Rfl:    Multiple Vitamins-Minerals (ZINC PO), Take 1 tablet by mouth daily after lunch., Disp: , Rfl:    pantoprazole (PROTONIX) 40 MG tablet, Take 1 tablet (40 mg total) by mouth 2 (two) times daily before a meal., Disp: 180  tablet, Rfl: 3   rosuvastatin (CRESTOR) 40 MG tablet, Take 1 tablet (40 mg total) by mouth daily., Disp: 90 tablet, Rfl: 3   sildenafil (REVATIO) 20 MG tablet, Take 40 mg by mouth 2 (two) times daily., Disp: , Rfl:    spironolactone (ALDACTONE) 25 MG tablet, Take 1 tablet (25 mg total) by mouth at bedtime., Disp: 90 tablet, Rfl: 3   sulfamethoxazole-trimethoprim (BACTRIM DS) 800-160 MG tablet, Take 1 tablet by mouth 2 (two) times daily for 7 days., Disp: 14 tablet, Rfl: 0   torsemide (DEMADEX) 20 MG tablet, Take 1 tablet (20 mg total) by mouth every morning., Disp: 30 tablet, Rfl: 5   Vitamin D-Vitamin K (VITAMIN K2-VITAMIN D3 PO), Take 1 tablet by mouth daily with lunch., Disp: , Rfl:

## 2023-03-30 NOTE — Telephone Encounter (Signed)
 Spoke to patient while in office, Garrett Roberts has been discontinued for now. I will be available if assistance is needed in the future.

## 2023-03-31 ENCOUNTER — Other Ambulatory Visit (HOSPITAL_COMMUNITY): Payer: Self-pay

## 2023-03-31 NOTE — Progress Notes (Signed)
 Advanced Heart Failure Clinic Note  PCP: Pcp, No HF Cardiology: Dr. Shirlee Latch  Chief complaint: CHF  68 y.o. with history of CAD, DM II, HTN, HLD, ischemic cardiomyopathy/chronic systolic CHF.   Patient had inferior STEMI 10/23 s/p PCI/DES to RCA. Had residual diffuse disease in diagonals and OMs. EF 40-45% at time of cath. Subsequent echo in 10/23 with EF 55-60%.   He was not taking medications in 2024 after losing insurance. Presented with anterior STEMI 12/10/22. Cardiac cath with 100% proximal LAD treated with PCI/DES. Prior RCA stent patent, severe disease in tortuous ramus and distal LCx treated medically.  Echo with EF 25%, no LV thrombus, AK mid septum to apex, moderately reduced RV systolic function. Course complicated by atrial fibrillation with RVR. Started on IV amiodarone and underwent DCCV to SR.  GDMT limited by soft blood pressure. Also seen by GI for abdominal pain. Had thickening of stomach on CT scan. H. Pylori later resulted positive after discharge, seen by GI and started on treatment for H pylori-related gastritis.   Returned to ED 12/19/22 with recurrent atrial fibrillation. He converted to SR while in waiting room and was discharged home.  Admitted 2/25 with acute CHF. Diuresed with IV lasix. GDMT cut back 2/2 to low BP, Entresto and spironolactone stopped. He was discharged home, weight 154 lbs.  Echo in 2/25 showed EF 25% with mild RV dysfunction and mild RV enlargement, PASP 60 mmHg, moderate MR.   He was seen again in the ER 03/14/23 with worsening fatigue and dyspnea.  Torsemide was increased to 40 qam/20 qpm.   Patient was set up for RHC in 3/25 due to ongoing severe fatigue. This showed low CI 2.02 by thermodilution as well as moderate pulmonary arterial hypertension, he was admitted and started on milrinone gtt.  He was gradually titrated off milrinone and maintained a good co-ox off milrinone.  He had a moderate right pleural effusion, underwent right  thoracentesis.  Fluid was transudative but grew E coli, he was treated with Bactrim based on sensitivities.  Given early satiety and poor appetite, he had an EGD that showed gastritic, biopsies showed no H pylori or gastric cancer.  HRCT chest showed bibasilar bronchiectasis, ?aspiration. However, swallow study was not suggestive of aspiration. V/Q scan was done due to pulmonary hypertension, this showed no chronic PE.  He was started on sildenafil and meds were titrated, he felt better when he went home.   Echo was done today and reviewed, showing EF 25-30%, mild RV dysfunction, moderate MR, PASP 62 mmHg.   Patient returns for followup of CHF. Weight is down 3 lbs. He is still fatigued in general with poor appetite, but his breathing is better.  No dyspnea walking on flat ground or with ADLs.  No orthopnea/PND.  No chest pain.  No palpitations/lightheadedness.  He is drinking Ensure every day.   Labs (12/24): hgb 15.1, BNP 350, K 4.4, creatinine 1.24 Labs (2/25): K 4.1, creatinine 1.48, BNP 481, LFTs normal, free T3/T4 normal Labs (3/25): K 3.5, creatinine 1.44 => 1.27, hgb 13.4, BNP 812, HS-TnI 44 => 39  SH: No ETOH or tobacco use.  Lives at home in [place] with his wife and children. Originally from [place].   PMH: 1. Chronic systolic CHF: Ischemic cardiomyopathy.   - Echo (12/24): EF 20-25%, LAD territory WMAs, moderate LVH, moderately decreased RV systolic function, mild MR.  - Echo (2/25): EF 25% with mild RV dysfunction and mild RV enlargement, PASP 60 mmHg, moderate MR.  -  RHC (3/25): mean RA 2, PA 60/18 mean 31, mean PCWP 17, CI 2.02 thermo, CI 2.52 Fick, PAPi 21, PVR 3.7 WU.  - Echo (3/25): EF 25-30%, mild RV dysfunction, moderate MR, PASP 62 mmHg.  2. CAD: Inferior STEMI 10/23 with DES to RCA.  - Anterior STEMI (11/24): Occluded proximal LAD treated with DES, occluded D2, 90% OM1, 95% distal LCx.  Distal LCx and OM1 treated medically.  3. Atrial fibrillation: Paroxysmal.  - DCCV in  12/24.  4. H pylori gastritis 5. Hyperlipidemia 6. Type 2 diabetes 7. Pulmonary hypertension: RHC (3/25) with mean RA 2, PA 60/18 mean 31, mean PCWP 17, CI 2.02 thermo, CI 2.52 Fick, PAPi 21, PVR 3.7 WU. - V/Q scan (3/25): No chronic PE.  - HRCT chest (3/25): bibasilar bronchiectasis, ?aspiration.  - ANA, RF, anti-SCL70 Ab, anti-centromere Ab all negative.  8. Pleural effusion: On right, s/p thoracentesis.   Current Outpatient Medications  Medication Sig Dispense Refill   amiodarone (PACERONE) 200 MG tablet Take 1 tablet (200 mg total) by mouth daily. 30 tablet 5   apixaban (ELIQUIS) 5 MG TABS tablet Take 1 tablet (5 mg total) by mouth 2 (two) times daily.     Ascorbic Acid (VITAMIN C PO) Take 1 tablet by mouth daily with lunch.     clopidogrel (PLAVIX) 75 MG tablet Take 1 tablet (75 mg total) by mouth daily. 30 tablet 2   Cyanocobalamin (B-12 PO) Take 1 tablet by mouth daily with lunch.     digoxin (LANOXIN) 0.125 MG tablet Take 1 tablet (0.125 mg total) by mouth daily. 90 tablet 3   empagliflozin (JARDIANCE) 10 MG TABS tablet Take 1 tablet (10 mg total) by mouth daily before breakfast.     ezetimibe (ZETIA) 10 MG tablet Take 1 tablet (10 mg total) by mouth daily. 30 tablet 2   MAGNESIUM PO Take 1 tablet by mouth at bedtime.     Multiple Vitamins-Minerals (ZINC PO) Take 1 tablet by mouth daily after lunch.     pantoprazole (PROTONIX) 40 MG tablet Take 1 tablet (40 mg total) by mouth 2 (two) times daily before a meal. 180 tablet 3   rosuvastatin (CRESTOR) 40 MG tablet Take 1 tablet (40 mg total) by mouth daily. 90 tablet 3   sulfamethoxazole-trimethoprim (BACTRIM DS) 800-160 MG tablet Take 1 tablet by mouth 2 (two) times daily for 7 days. 14 tablet 0   tadalafil (CIALIS) 20 MG tablet Take 2 tablets (40 mg total) by mouth daily. 90 tablet 3   torsemide (DEMADEX) 20 MG tablet Take 1 tablet (20 mg total) by mouth every morning. 30 tablet 5   Vitamin D-Vitamin K (VITAMIN K2-VITAMIN D3 PO) Take  1 tablet by mouth daily with lunch.     spironolactone (ALDACTONE) 25 MG tablet Take 1 tablet (25 mg total) by mouth at bedtime. 90 tablet 3   No current facility-administered medications for this encounter.   Allergies  Allergen Reactions   Beef-Derived Drug Products Other (See Comments)    Religious observance   Pork-Derived Products Other (See Comments)    Religious observance   Family History  Problem Relation Age of Onset   Diabetes Mother    Chronic Renal Failure Father    Diabetes Father    Chronic Renal Failure Brother    Diabetes Brother    Diabetes Brother    Heart attack Brother    Stroke Brother    Wt Readings from Last 3 Encounters:  03/30/23 67.4 kg (148 lb 9.6  oz)  03/24/23 65.7 kg (144 lb 13.5 oz)  03/16/23 68.5 kg (151 lb)   BP (!) 94/58   Pulse 71   Wt 67.4 kg (148 lb 9.6 oz)   SpO2 100%   BMI 20.15 kg/m   PHYSICAL EXAM: General: NAD Neck: No JVD, no thyromegaly or thyroid nodule.  Lungs: Clear to auscultation bilaterally with normal respiratory effort. CV: Nondisplaced PMI.  Heart regular S1/S2, no S3/S4, no murmur.  No peripheral edema.  No carotid bruit.  Normal pedal pulses.  Abdomen: Soft, nontender, no hepatosplenomegaly, no distention.  Skin: Intact without lesions or rashes.  Neurologic: Alert and oriented x 3.  Psych: Normal affect. Extremities: No clubbing or cyanosis.  HEENT: Normal.   ASSESSMENT & PLAN: 1. Chronic systolic CHF/ischemic cardiomyopathy:  Echo 12/24 with EF 20-25%, no LV thrombus, AK mid septum to apex, moderately reduced RV systolic function. Echo 2/25 with EF 25%, mildly reduced RV function.  Low BP has limited GDMT titration.  His profound fatigue has been concerning for low output HF. RHC 3/25 showed low CI by thermo (2.05) but preserved by Fick.  Borderline elevated PCWP, normal RA pressure.  There was also moderate mixed pulmonary arterial/pulmonary venous hypertension.  With his significant symptoms, he was started on  milrinone 0.25 and added sildenafil to try to lower PA pressure.  Milrinone was later weaned off with medication adjustment, and co-ox remained excellent.  Repeat echo was done today and I reviewed, showing EF 25-30%, mild RV dysfunction, moderate MR, PASP 62 mmHg. He is symptomatically improved today, NYHA class II-III but still with fatigue.  He is not volume overloaded on exam.  - We discussed LVAD during last admission.  I think he is not quite there yet but we are going to need to follow him very closely.  We discussed this further today.  Now with NYHA class II-III symptoms and was able to wean off milrinone at last admission without much difficulty.  - Continue torsemide 20 mg daily, BMET/BNP today and can decrease to every other day torsemide if creatinine higher.  - Continue Jardiance 10 mg daily. - Increase digoxin 0.125 daily with digoxin level < 0.2 on 0.0625 daily. Digoxin level with 10 day labs.  - off Toprol XL with recent low output.  - Increase spironolactone to 25 mg daily.  BMET in 10 days.  - With persistently low EF, I am going to refer for ICD.  Narrow QRS so not CRT candidate.    2. CAD:  Hx inferior STEMI 10/23 s/p PCI/DES to RCA. Anterior STEMI 12/10/22, cath showed occluded pLAD treated with PCI/DES, prior RCA stent patent, severe disease OM1 and distal LCx treated medically. No chest pain.  - Continue Plavix.   - No ASA given Eliquis use.  - Continue rosuvastatin 40 mg + Zetia 10 mg daily. 3. Atrial fibrillation: Paroxysmal. NSR today.  - continue Eliquis 5 mg bid  - Continue PO amiodarone 200 mg daily, check LFTs and TSH today.  He will need a regular eye exam.  4. DM II: Continue Jardiance + SSI  5. 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 Not a smoker, no known lung disease. CXR has shown some chronic interstitial changes. HRCT chest with bibasilar bronchiectasis that may be due to aspiration (though swallow study  negative).  V/Q scan showed no chronic PE. Rheumatologic serologies negative - I will transition him from sildenafil 40 tid to tadalafil 40 mg daily.   6. Pleural effusion: Moderate on right.  S/p right thoracentesis 3/25. Pleural fluid was transudative by Light's criteria, no malignant cells. However, it grew E coli.  - He is on a course of Bactrim based on sensitivities.  Will follow K and creatinine closely.  7. GI: Has had significant weight loss, early satiety, GI discomfort.  CT abdomen/pelvis with gastric wall thickening. GI was consulted and EGD was performed, no definite evidence for malignancy, biopsies taken for H pylori testing and pathology => negative for H pylori and no gastric cancer. Heart failure may play a role in the symptoms.  - Continue PPI - Needs outpatient GI followup.   Followup 1 month with APP.   I spent 41 minutes reviewing records, interviewing/examining patient, and managing orders.    Marca Ancona 03/31/23

## 2023-04-03 ENCOUNTER — Telehealth (HOSPITAL_COMMUNITY): Payer: Self-pay

## 2023-04-03 DIAGNOSIS — I5022 Chronic systolic (congestive) heart failure: Secondary | ICD-10-CM

## 2023-04-03 NOTE — Telephone Encounter (Addendum)
 Pt aware, agreeable, and verbalized understanding  Labs scheduled for 03/31  ----- Message from Marca Ancona sent at 03/30/2023  5:30 PM EDT ----- Elevated creatinine, decrease torsemide to 20 mg every other day.  BMET in 1 week.

## 2023-04-05 ENCOUNTER — Ambulatory Visit: Payer: Self-pay | Admitting: General Practice

## 2023-04-05 ENCOUNTER — Encounter: Payer: Self-pay | Admitting: General Practice

## 2023-04-05 ENCOUNTER — Other Ambulatory Visit (HOSPITAL_COMMUNITY): Payer: Self-pay

## 2023-04-05 ENCOUNTER — Telehealth: Payer: Self-pay | Admitting: Gastroenterology

## 2023-04-05 VITALS — BP 108/62 | HR 66 | Temp 98.2°F | Ht 70.0 in | Wt 146.0 lb

## 2023-04-05 DIAGNOSIS — Z09 Encounter for follow-up examination after completed treatment for conditions other than malignant neoplasm: Secondary | ICD-10-CM | POA: Insufficient documentation

## 2023-04-05 DIAGNOSIS — E038 Other specified hypothyroidism: Secondary | ICD-10-CM

## 2023-04-05 DIAGNOSIS — I10 Essential (primary) hypertension: Secondary | ICD-10-CM | POA: Diagnosis not present

## 2023-04-05 DIAGNOSIS — K5900 Constipation, unspecified: Secondary | ICD-10-CM

## 2023-04-05 DIAGNOSIS — Z7689 Persons encountering health services in other specified circumstances: Secondary | ICD-10-CM | POA: Insufficient documentation

## 2023-04-05 DIAGNOSIS — I5023 Acute on chronic systolic (congestive) heart failure: Secondary | ICD-10-CM

## 2023-04-05 DIAGNOSIS — I4891 Unspecified atrial fibrillation: Secondary | ICD-10-CM

## 2023-04-05 DIAGNOSIS — R1084 Generalized abdominal pain: Secondary | ICD-10-CM | POA: Diagnosis not present

## 2023-04-05 DIAGNOSIS — E7800 Pure hypercholesterolemia, unspecified: Secondary | ICD-10-CM

## 2023-04-05 DIAGNOSIS — I2510 Atherosclerotic heart disease of native coronary artery without angina pectoris: Secondary | ICD-10-CM

## 2023-04-05 DIAGNOSIS — R634 Abnormal weight loss: Secondary | ICD-10-CM

## 2023-04-05 MED ORDER — ONDANSETRON 4 MG PO TBDP
4.0000 mg | ORAL_TABLET | Freq: Three times a day (TID) | ORAL | 0 refills | Status: DC | PRN
  Filled 2023-04-05: qty 20, 7d supply, fill #0

## 2023-04-05 MED ORDER — LEVOTHYROXINE SODIUM 25 MCG PO TABS
25.0000 ug | ORAL_TABLET | Freq: Every day | ORAL | 0 refills | Status: DC
  Filled 2023-04-05: qty 60, 60d supply, fill #0

## 2023-04-05 NOTE — Assessment & Plan Note (Signed)
 EMR reviewed briefly.

## 2023-04-05 NOTE — Telephone Encounter (Signed)
 Patient scheduled to see Doug Sou PA at 1:30pm. Pt aware of appt.

## 2023-04-05 NOTE — Assessment & Plan Note (Signed)
 Continue Plavix 75 mg daily.  Continue Crestor 40 mg daily.  Followed by cardiology.

## 2023-04-05 NOTE — Progress Notes (Signed)
 New Patient Office Visit  Subjective    Patient ID: Garrett Roberts, male    DOB: 26-Aug-1955  Age: 68 y.o. MRN: [id number]  CC:  Chief Complaint  Patient presents with   New Patient (Initial Visit)   Hospitalization Follow-up    HPI Garrett Roberts is a 68 y.o. male presents to establish care and hospital follow-up.  Last PCP, physical, labs: no previous pcp  Hospitalization f/u: He was admitted at [facility] from 03/20/2023 to 03/24/2023.  Patient presented for surgery on 03/20/2023 with a diagnosis of heart failure and for a heart catheterization.  He was hospitalized after his procedure given his cardiac history.  He had a inferior STEMI in October 2023 status post PCI/DES to RCA.  In 2024 he stopped taking all of his medications after losing insurance.  He presented with anterior STEMI on 12/10/22.  He had a cardiac cath with 100% proximal LAD treated with PCI/DES.  Echo with EF 25%.  On 12/19/2022 he went to the hospital for recurrent atrial fibrillation however he converted to sinus rhythm while in the waiting room and was discharged home.  He had another hospitalization on 02/2023 with acute CHF.  He was diuresed with IV Lasix.  Echo in February 2025 showed EF 25% with mild RV dysfunction and mild RV enlargement, PASP 60 mmHg, moderate MR. Again he was seen in the ER on 03/14/2023 with worsening fatigue and dyspnea.  Torsemide was increased to 40 every morning and 20 at night.  While in the hospital he continued to have abdominal pain.  He had a EGD on 03/23/23 which showed granular gastric mucosa, small hiatal hernia, biopsies were completed to check for H. pylori.  He was asked to start pantoprazole 40 mg twice daily.  He was asked to follow-up with cardiology, establish with PCP, follow-up with GI postdischarge.  CAD/hypertension/A-fib/chronic heart failure/hyperlipidemia/pulmonary hypertension/pleural effusion: Currently managed on amiodarone 200 mg once daily, Eliquis 5 mg twice daily,  Plavix 75 mg once daily, digoxin 0.125 mg once daily, Jardiance 10 mg once daily, Crestor 40 mg once daily, spironolactone 25 mg once daily, torsemide 20 mg every other day.  He is followed by cardiology.  His last echogram was on 03/30/2023 which shows EF 25-30%, mild RV dysfunction, moderate MR, PASP 62 mmHg.  He had a inferior STEMI October 2023 and anterior STEMI November 2024.  Due to his persistent low EF cardiology referred him for ICD.  He was asked to continue his Plavix and Eliquis.  Zetia 10 mg was also added.  Pulmonary hypertension was found on chest x-ray, he is not a smoker and no known lung disease, his VQ scan showed no chronic PE and rheumatology G serologies were all negative.  He was transition from sildenafil 40 3 times daily to tadalafil 40 mg daily.  He had a 37 T-System on 325 for pleural effusion and he was prescribed Bactrim for as the pleural fluid grow E coli.  Today he reports he has finished the Bactrim.  He denies any shortness of breath, difficulty breathing, chest pain.  He is due to have a repeat BMP with cardiologist on 04/10/2023.  He denies any leg swelling or abnormal weight gain.  Abdominal pain: Symptom onset was several months ago.  While in the hospital he had an EGD 03/23/23 showed a small hiatal hernia and granular gastric mucosa.  He was started on Protonix 40 mg twice daily.  He also had a CT abd/pelvis on 03/21/2023 showed no acute localizing process  in the abdomen or pelvis.  Did show trace bilateral pleural effusions, left greater than right, with atelectasis in the bilateral lung bases; distended bladder; large amount of stool throughout the colon, aortic atherosclerosis.  There is also some questionable gastric wall thickening at the level of greater curvature and changed which was poorly clinically related to gastritis.  He reports having a colonscopy 7 years ago at [place]. He has a BM every other day. He denies any bleeding, nausea, vomiting. He does have decreased  appetite. At times, he does get nausea when he eats some meals. No family history of colon cancer, stomach cancer, rectal cancer.  Upon reviewing the chart it was found that he saw [facility] in January 2025 when he was treated for H. pylori bacteria.  He was asked to follow-up in 3 months.  He reports overall his symptoms have remained the same and  he still continues to have decreased appetite and weight loss.   Outpatient Encounter Medications as of 04/05/2023  Medication Sig   amiodarone (PACERONE) 200 MG tablet Take 1 tablet (200 mg total) by mouth daily.   apixaban (ELIQUIS) 5 MG TABS tablet Take 1 tablet (5 mg total) by mouth 2 (two) times daily.   Ascorbic Acid (VITAMIN C PO) Take 1 tablet by mouth daily with lunch.   clopidogrel (PLAVIX) 75 MG tablet Take 1 tablet (75 mg total) by mouth daily.   Cyanocobalamin (B-12 PO) Take 1 tablet by mouth daily with lunch.   digoxin (LANOXIN) 0.125 MG tablet Take 1 tablet (0.125 mg total) by mouth daily.   empagliflozin (JARDIANCE) 10 MG TABS tablet Take 1 tablet (10 mg total) by mouth daily before breakfast.   ezetimibe (ZETIA) 10 MG tablet Take 1 tablet (10 mg total) by mouth daily.   levothyroxine (SYNTHROID) 25 MCG tablet Take 1 tablet (25 mcg total) by mouth daily.   MAGNESIUM PO Take 1 tablet by mouth at bedtime.   Multiple Vitamins-Minerals (ZINC PO) Take 1 tablet by mouth daily after lunch.   ondansetron (ZOFRAN-ODT) 4 MG disintegrating tablet Take 1 tablet (4 mg total) by mouth every 8 (eight) hours as needed.   pantoprazole (PROTONIX) 40 MG tablet Take 1 tablet (40 mg total) by mouth 2 (two) times daily before a meal.   rosuvastatin (CRESTOR) 40 MG tablet Take 1 tablet (40 mg total) by mouth daily.   spironolactone (ALDACTONE) 25 MG tablet Take 1 tablet (25 mg total) by mouth at bedtime.   tadalafil (CIALIS) 20 MG tablet Take 2 tablets (40 mg total) by mouth daily.   torsemide (DEMADEX) 20 MG tablet Take 1 tablet (20 mg total) by mouth  every morning.   Vitamin D-Vitamin K (VITAMIN K2-VITAMIN D3 PO) Take 1 tablet by mouth daily with lunch.   No facility-administered encounter medications on file as of 04/05/2023.    Past Medical History:  Diagnosis Date   Acute ST elevation myocardial infarction (STEMI) of anterior wall (HCC) 12/10/2022   Acute ST elevation myocardial infarction (STEMI) of inferior wall (HCC) 10/30/2021   Atrial fibrillation (HCC)    Atrial fibrillation with RVR (HCC) 12/19/2022   CHF (congestive heart failure) (HCC)    Coronary artery disease    Diabetes mellitus without complication (HCC)    DM (diabetes mellitus) (HCC)    H. pylori infection    Heart attack (HCC)    HLD (hyperlipidemia)    Hypertension    ST elevation myocardial infarction involving left anterior descending (LAD) coronary artery (HCC) 12/10/2022  Past Surgical History:  Procedure Laterality Date   CARDIOVERSION N/A 12/14/2022   Procedure: CARDIOVERSION;  Surgeon: Little Ishikawa, MD;  Location: [facility] INVASIVE CV LAB;  Service: Cardiovascular;  Laterality: N/A;   CORONARY/GRAFT ACUTE MI REVASCULARIZATION N/A 10/30/2021   Procedure: Coronary/Graft Acute MI Revascularization;  Surgeon: Yates Decamp, MD;  Location: [facility] INVASIVE CV LAB;  Service: Cardiovascular;  Laterality: N/A;   CORONARY/GRAFT ACUTE MI REVASCULARIZATION N/A 12/10/2022   Procedure: Coronary/Graft Acute MI Revascularization;  Surgeon: Tonny Bollman, MD;  Location: [facility] INVASIVE CV LAB;  Service: Cardiovascular;  Laterality: N/A;   ESOPHAGOGASTRODUODENOSCOPY N/A 03/23/2023   Procedure: EGD (ESOPHAGOGASTRODUODENOSCOPY);  Surgeon: Jenel Lucks, MD;  Location: [facility] ENDOSCOPY;  Service: Gastroenterology;  Laterality: N/A;   LEFT HEART CATH AND CORONARY ANGIOGRAPHY N/A 10/30/2021   Procedure: LEFT HEART CATH AND CORONARY ANGIOGRAPHY;  Surgeon: Yates Decamp, MD;  Location: [facility] INVASIVE CV LAB;  Service: Cardiovascular;  Laterality: N/A;   LEFT HEART CATH AND CORONARY  ANGIOGRAPHY N/A 12/10/2022   Procedure: LEFT HEART CATH AND CORONARY ANGIOGRAPHY;  Surgeon: Tonny Bollman, MD;  Location[facility] INVASIVE CV LAB;  Service: Cardiovascular;  Laterality: N/A;   RIGHT HEART CATH N/A 03/20/2023   Procedure: RIGHT HEART CATH;  Surgeon: Laurey Morale, MD;  Location: [facility] INVASIVE CV LAB;  Service: Cardiovascular;  Laterality: N/A;    Family History  Problem Relation Age of Onset   Diabetes Mother    Chronic Renal Failure Father    Diabetes Father    Chronic Renal Failure Brother    Diabetes Brother    Diabetes Brother    Heart attack Brother    Stroke Brother     Social History   Socioeconomic History   Marital status: Married    Spouse name: Amani   Number of children: 5   Years of education: Not on file   Highest education level: High school graduate  Occupational History   Occupation: part time  Tobacco Use   Smoking status: Never   Smokeless tobacco: Never  Vaping Use   Vaping status: Never Used  Substance and Sexual Activity   Alcohol use: No   Drug use: No   Sexual activity: Not on file  Other Topics Concern   Not on file  Social History Narrative   ** Merged History Encounter **       Social Drivers of Health   Financial Resource Strain: High Risk (12/13/2022)   Overall Financial Resource Strain (CARDIA)    Difficulty of Paying Living Expenses: Very hard  Food Insecurity: No Food Insecurity (03/20/2023)   Hunger Vital Sign    Worried About Running Out of Food in the Last Year: Never true    Ran Out of Food in the Last Year: Never true  Transportation Needs: No Transportation Needs (03/20/2023)   PRAPARE - Administrator, Civil Service (Medical): No    Lack of Transportation (Non-Medical): No  Physical Activity: Sufficiently Active (11/27/2019)   Received from [facility], [facility]   Exercise Vital Sign    Days of Exercise per Week: 4 days    Minutes of Exercise per Session: 60 min  Stress: Stress Concern  Present (11/27/2019)   Received from [facility], [facility] - Occupational Stress Questionnaire    Feeling of Stress : To some extent  Social Connections: Socially Integrated (03/20/2023)   Social Connection and Isolation Panel [NHANES]    Frequency of Communication with Friends and Family:  More than three times a week    Frequency of Social Gatherings with Friends and Family: More than three times a week    Attends Religious Services: More than 4 times per year    Active Member of [employer] or Organizations: Yes    Attends Engineer, structural: More than 4 times per year    Marital Status: Married  Catering manager Violence: Patient Unable To Answer (03/20/2023)   Humiliation, Afraid, Rape, and Kick questionnaire    Fear of Current or Ex-Partner: Patient unable to answer    Emotionally Abused: Patient unable to answer    Physically Abused: Patient unable to answer    Sexually Abused: Patient unable to answer    Review of Systems  Constitutional:  Negative for chills and fever.  Respiratory:  Negative for shortness of breath.   Cardiovascular:  Negative for chest pain.  Gastrointestinal:  Positive for abdominal pain. Negative for constipation, diarrhea, heartburn, nausea and vomiting.  Genitourinary:  Negative for dysuria, frequency and urgency.  Neurological:  Negative for dizziness and headaches.  Endo/Heme/Allergies:  Negative for polydipsia.  Psychiatric/Behavioral:  Negative for depression and suicidal ideas. The patient is not nervous/anxious.         Objective    BP 108/62 (BP Location: Left Arm, Patient Position: Sitting, Cuff Size: Normal)   Pulse 66   Temp 98.2 F (36.8 C) (Oral)   Ht 5\' 10"  (1.778 m)   Wt 146 lb (66.2 kg)   SpO2 98%   BMI 20.95 kg/m   Physical Exam Vitals and nursing note reviewed.  Constitutional:      Appearance: Normal appearance.  Cardiovascular:     Rate and Rhythm: Normal rate and  regular rhythm.     Pulses: Normal pulses.     Heart sounds: Normal heart sounds.  Pulmonary:     Effort: Pulmonary effort is normal.     Breath sounds: Normal breath sounds.  Abdominal:     General: Bowel sounds are normal.     Palpations: Abdomen is soft.  Musculoskeletal:     Right lower leg: No edema.     Left lower leg: No edema.  Neurological:     Mental Status: He is alert and oriented to person, place, and time.  Psychiatric:        Mood and Affect: Mood normal.        Behavior: Behavior normal.        Thought Content: Thought content normal.        Judgment: Judgment normal.         Assessment & Plan:  Hospital discharge follow-up Assessment & Plan: Reviewed ER notes, labs, imaging and procedure notes reviewed.    Establishing care with new doctor, encounter for Assessment & Plan: EMR reviewed briefly.   Constipation, unspecified constipation type Assessment & Plan: Chronic.  Exam stable today. CT abdomen showed large amounts of stool in the colon. Continue stool softeners.  Follow-up with GI.  Consider colonoscopy.   Generalized abdominal pain Assessment & Plan: Unclear etiology. Exam stable today. Loss of weight is concerning and decreased appetite.  Reviewed labs, CT scan and EGD report. All of those were unremarkable.   Continue Protonix 40 mg BID.   Per patient, had a colonoscopy 7-8 years ago at [place]. Doesn't remember results.   Saw [facility] in January and was treated for H.pylori.  Discussed this with patient. Requested patient to schedule follow up with GI as soon as possible as he is  already established there.  Will continue to monitor.  Orders: -     Ondansetron; Take 1 tablet (4 mg total) by mouth every 8 (eight) hours as needed.  Dispense: 20 tablet; Refill: 0  Subclinical hypothyroidism Assessment & Plan: Worsening over the last 3 months.  Reviewed TSH levels, T4, T3 levels in mychart.   Start levothyroxine 25 mcg once daily.   Patient educated on proper medication intake.  Verbalizes understanding.  Will recheck TSH in 6-8 weeks.   Orders: -     Levothyroxine Sodium; Take 1 tablet (25 mcg total) by mouth daily.  Dispense: 60 tablet; Refill: 0  Pure hypercholesterolemia  Acute on chronic systolic CHF (congestive heart failure) (HCC) Assessment & Plan: Uncontrolled.  Followed by cardiology.  Continue amiodarone 200 mg once daily, Eliquis 5 mg twice daily, Plavix 75 mg once daily, digoxin 0.125 mg once daily, Jardiance 10 mg once daily, spironolactone 25 mg once daily, Torsemide 20 mg every other day.  Discussed medication adherence with patient.  Reviewed cardiac notes from his visit on 03/30/23.   Will continue to monitor.   Atrial fibrillation, unspecified type [facility]) Assessment & Plan: Continue Eliquis and amiodarone. Followed by cardiology.   Coronary artery disease involving native coronary artery of native heart without angina pectoris Assessment & Plan: Continue Plavix 75 mg daily.  Continue Crestor 40 mg daily.  Followed by cardiology.   Essential hypertension Assessment & Plan: Blood pressure at goal today. Followed by cardiology.   Loss of weight Assessment & Plan: Unclear etiology.  Suspect this could be related to heart failure. He will follow-up with GI. CT abdomen/pelvis and endoscopy reviewed.     Return in about 1 week (around 04/12/2023) for diabetes.   Modesto Charon, NP

## 2023-04-05 NOTE — Assessment & Plan Note (Signed)
 Reviewed ER notes, labs, imaging and procedure notes reviewed.

## 2023-04-05 NOTE — Assessment & Plan Note (Addendum)
 Worsening over the last 3 months.  Reviewed TSH levels, T4, T3 levels in mychart.   Start levothyroxine 25 mcg once daily.  Patient educated on proper medication intake.  Verbalizes understanding.  Will recheck TSH in 6-8 weeks.

## 2023-04-05 NOTE — Telephone Encounter (Signed)
 PT requesting to speak to a nurse regarding increased stomach pains. Please advise.

## 2023-04-05 NOTE — Assessment & Plan Note (Signed)
 Unclear etiology.  Suspect this could be related to heart failure. He will follow-up with GI. CT abdomen/pelvis and endoscopy reviewed.

## 2023-04-05 NOTE — Assessment & Plan Note (Signed)
 Continue Crestor 40 mg once daily.  Followed by cardiology.

## 2023-04-05 NOTE — Assessment & Plan Note (Addendum)
 Chronic.  Exam stable today. CT abdomen showed large amounts of stool in the colon. Continue stool softeners.  Follow-up with GI.  Consider colonoscopy.

## 2023-04-05 NOTE — Patient Instructions (Addendum)
 [address] Address: [address], [place], [place] [address] Phone: [phone]  Need an office visit with GI.   Start levothyroxine - Take 1 tablet by mouth every morning on an empty stomach with water only.  No food or other medications for 30 minutes.  Schedule tetanus and pnuemonia vaccine at the pharmacy.   Come back in one week.   It was a pleasure meeting you!

## 2023-04-05 NOTE — Assessment & Plan Note (Signed)
 Continue Eliquis and amiodarone. Followed by cardiology.

## 2023-04-05 NOTE — Assessment & Plan Note (Signed)
 Uncontrolled.  Followed by cardiology.  Continue amiodarone 200 mg once daily, Eliquis 5 mg twice daily, Plavix 75 mg once daily, digoxin 0.125 mg once daily, Jardiance 10 mg once daily, spironolactone 25 mg once daily, Torsemide 20 mg every other day.  Discussed medication adherence with patient.  Reviewed cardiac notes from his visit on 03/30/23.   Will continue to monitor.

## 2023-04-05 NOTE — Assessment & Plan Note (Addendum)
 Blood pressure at goal today. Followed by cardiology.

## 2023-04-05 NOTE — Assessment & Plan Note (Addendum)
 Unclear etiology. Exam stable today. Loss of weight is concerning and decreased appetite.  Reviewed labs, CT scan and EGD report. All of those were unremarkable.   Continue Protonix 40 mg BID.   Per patient, had a colonoscopy 7-8 years ago at [place]. Doesn't remember results.   Saw [facility] in January and was treated for H.pylori.  Discussed this with patient. Requested patient to schedule follow up with GI as soon as possible as he is already established there.  Will continue to monitor.

## 2023-04-06 ENCOUNTER — Other Ambulatory Visit (HOSPITAL_COMMUNITY): Payer: Self-pay | Admitting: Physician Assistant

## 2023-04-06 ENCOUNTER — Other Ambulatory Visit (HOSPITAL_COMMUNITY): Payer: Self-pay

## 2023-04-07 ENCOUNTER — Other Ambulatory Visit: Payer: Self-pay

## 2023-04-07 ENCOUNTER — Other Ambulatory Visit (HOSPITAL_COMMUNITY): Payer: Self-pay

## 2023-04-07 MED ORDER — EZETIMIBE 10 MG PO TABS
10.0000 mg | ORAL_TABLET | Freq: Every day | ORAL | 3 refills | Status: DC
  Filled 2023-04-07: qty 30, 30d supply, fill #0
  Filled 2023-05-19 – 2023-06-02 (×2): qty 30, 30d supply, fill #1

## 2023-04-10 ENCOUNTER — Ambulatory Visit (HOSPITAL_COMMUNITY)
Admission: RE | Admit: 2023-04-10 | Discharge: 2023-04-10 | Disposition: A | Discharge status: Home / Self Care | Source: Ambulatory Visit | Attending: Internal Medicine | Admitting: Internal Medicine

## 2023-04-10 DIAGNOSIS — I5022 Chronic systolic (congestive) heart failure: Secondary | ICD-10-CM | POA: Insufficient documentation

## 2023-04-10 LAB — BASIC METABOLIC PANEL WITH GFR
Anion gap: 9 (ref 5–15)
BUN: 22 mg/dL (ref 8–23)
CO2: 26 mmol/L (ref 22–32)
Calcium: 9.1 mg/dL (ref 8.9–10.3)
Chloride: 99 mmol/L (ref 98–111)
Creatinine, Ser: 1.67 mg/dL — ABNORMAL HIGH (ref 0.61–1.24)
GFR, Estimated: 45 mL/min — ABNORMAL LOW (ref >60)
Glucose, Bld: 187 mg/dL — ABNORMAL HIGH (ref 70–99)
Potassium: 4.4 mmol/L (ref 3.5–5.1)
Sodium: 134 mmol/L — ABNORMAL LOW (ref 135–145)

## 2023-04-12 ENCOUNTER — Encounter: Payer: Self-pay | Admitting: General Practice

## 2023-04-12 ENCOUNTER — Ambulatory Visit (INDEPENDENT_AMBULATORY_CARE_PROVIDER_SITE_OTHER): Admitting: General Practice

## 2023-04-12 VITALS — BP 116/62 | HR 85 | Temp 97.6°F | Ht 70.0 in | Wt 148.0 lb

## 2023-04-12 DIAGNOSIS — Z23 Encounter for immunization: Secondary | ICD-10-CM | POA: Diagnosis not present

## 2023-04-12 DIAGNOSIS — E119 Type 2 diabetes mellitus without complications: Secondary | ICD-10-CM

## 2023-04-12 DIAGNOSIS — Z7984 Long term (current) use of oral hypoglycemic drugs: Secondary | ICD-10-CM

## 2023-04-12 DIAGNOSIS — E118 Type 2 diabetes mellitus with unspecified complications: Secondary | ICD-10-CM | POA: Diagnosis not present

## 2023-04-12 DIAGNOSIS — R1032 Left lower quadrant pain: Secondary | ICD-10-CM | POA: Diagnosis not present

## 2023-04-12 NOTE — Assessment & Plan Note (Signed)
 Uncontrolled with a hemoglobin A1c reading of 7.8 on February 16, 2023.  Foot exam completed today. Pneumonia vaccine updated today. Urine ACR pending. Referral placed for diabetic eye exam. Continue rosuvastatin 40 mg daily for CVD risk.  Continue Jardiance 10 mg once daily. Will recheck A1c in May and resume metformin if needed.  Follow-up in 5 weeks.

## 2023-04-12 NOTE — Assessment & Plan Note (Signed)
 Patient continues to have abdominal pain.  Unclear etiology. He has a follow-up scheduled with GI on Friday.  Continue Protonix 40 mg twice daily.  Consider colonoscopy given age and symptoms.

## 2023-04-12 NOTE — Patient Instructions (Addendum)
 Schedule medicare wellness visit with nurse.   Schedule diabetic eye exam.  Referral has been placed. You will get a message on mychart regarding the information of the referral.   Schedule tetanus shot and shingles vaccine at the pharmacy.   Continue Jardiance.   Start checking your blood sugar levels.  Appropriate times to check your blood sugar levels are:  -Before any meal (breakfast, lunch, dinner) -Two hours after any meal (breakfast, lunch, dinner) -Bedtime  Record your readings and notify me if you continue to consistently run at or above 150.   Follow up with me after you have seen GI and cardio on 05/17/23. We will discuss your kidney functions and check your A1c.   It was a pleasure to see you today!

## 2023-04-12 NOTE — Progress Notes (Signed)
 Established Patient Office Visit  Subjective   Patient ID: Garrett Roberts, male    DOB: 1955-04-16  Age: 68 y.o. MRN: [id number]  Chief Complaint  Patient presents with   Diabetes    Patient here today to f/u on DM.     Diabetes Pertinent negatives for hypoglycemia include no dizziness, headaches or nervousness/anxiousness. Pertinent negatives for diabetes include no chest pain and no polydipsia.    Garrett Roberts is a 68 year old with past medical history of CAD, HTN, A fib, Chronic HFrEF, type 2 DM, CKD stage II, HLD presents today for follow up.   Type 2 DM: Current medications include: Jardiance 10mg  once daily; prior to hospitalization he was also on Metformin 500 mg twice daily.   He is checking his blood glucose once in a while and is getting readings are below 200. Usually is after dinner.  Last A1C: 7.8 in Feb 16, 2023 Last Eye Exam: several years ago.  Last Foot Exam: due Pneumonia Vaccination: due Urine Microalbumin: due Statin: rosuvastatin 40 mg once daily.   Dietary changes since last visit: does not have any appetite. But tries to eat healthy.   Exercise: no regular exercise.    Patient Active Problem List   Diagnosis Date Noted   Establishing care with new doctor, encounter for 04/05/2023   Hospital discharge follow-up 04/05/2023   Subclinical hypothyroidism 04/05/2023   Loss of weight 03/23/2023   Protein-calorie malnutrition, severe 03/22/2023   Acute on chronic systolic CHF (congestive heart failure) (HCC) 02/17/2023   Ischemic cardiomyopathy 12/19/2022   Chronic HFrEF (heart failure with reduced ejection fraction) (HCC) 12/19/2022   CKD (chronic kidney disease), 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   Type 2 diabetes mellitus with complication, without long-term current use of insulin (HCC) 12/15/2022   Atrial fibrillation (HCC) 12/14/2022   Constipation 12/12/2022   Abdominal pain 12/11/2022   Abnormal finding on GI tract imaging 12/11/2022   CAD  (coronary artery disease) 10/11/2022   Essential hypertension 10/11/2022   Hyperlipidemia 10/11/2022   Vitamin D deficiency 08/03/2015   Past Medical History:  Diagnosis Date   Acute ST elevation myocardial infarction (STEMI) of anterior wall (HCC) 12/10/2022   Acute ST elevation myocardial infarction (STEMI) of inferior wall (HCC) 10/30/2021   Atrial fibrillation (HCC)    Atrial fibrillation with RVR (HCC) 12/19/2022   CHF (congestive heart failure) (HCC)    Coronary artery disease    Diabetes mellitus without complication (HCC)    DM (diabetes mellitus) (HCC)    H. pylori infection    Heart attack (HCC)    HLD (hyperlipidemia)    Hypertension    ST elevation myocardial infarction involving left anterior descending (LAD) coronary artery (HCC) 12/10/2022   Past Surgical History:  Procedure Laterality Date   CARDIOVERSION N/A 12/14/2022   Procedure: CARDIOVERSION;  Surgeon: Little Ishikawa, MD;  Location: [facility] INVASIVE CV LAB;  Service: Cardiovascular;  Laterality: N/A;   CORONARY/GRAFT ACUTE MI REVASCULARIZATION N/A 10/30/2021   Procedure: Coronary/Graft Acute MI Revascularization;  Surgeon: Yates Decamp, MD;  Location: [facility] INVASIVE CV LAB;  Service: Cardiovascular;  Laterality: N/A;   CORONARY/GRAFT ACUTE MI REVASCULARIZATION N/A 12/10/2022   Procedure: Coronary/Graft Acute MI Revascularization;  Surgeon: Tonny Bollman, MD;  Location[facility] INVASIVE CV LAB;  Service: Cardiovascular;  Laterality: N/A;   ESOPHAGOGASTRODUODENOSCOPY N/A 03/23/2023   Procedure: EGD (ESOPHAGOGASTRODUODENOSCOPY);  Surgeon: Jenel Lucks, MD;  Location: [facility] ENDOSCOPY;  Service: Gastroenterology;  Laterality: N/A;   LEFT HEART CATH AND CORONARY ANGIOGRAPHY N/A 10/30/2021  Procedure: LEFT HEART CATH AND CORONARY ANGIOGRAPHY;  Surgeon: Yates Decamp, MD;  Location: [facility] INVASIVE CV LAB;  Service: Cardiovascular;  Laterality: N/A;   LEFT HEART CATH AND CORONARY ANGIOGRAPHY N/A 12/10/2022   Procedure: LEFT  HEART CATH AND CORONARY ANGIOGRAPHY;  Surgeon: Tonny Bollman, MD;  Location: [facility] INVASIVE CV LAB;  Service: Cardiovascular;  Laterality: N/A;   RIGHT HEART CATH N/A 03/20/2023   Procedure: RIGHT HEART CATH;  Surgeon: Laurey Morale, MD;  Location: [facility] INVASIVE CV LAB;  Service: Cardiovascular;  Laterality: N/A;   Allergies  Allergen Reactions   Beef-Derived Drug Products Other (See Comments)    Religious observance   Pork-Derived Products Other (See Comments)    Religious observance         04/05/2023    2:33 PM  Depression screen PHQ 2/9  Decreased Interest 0  Down, Depressed, Hopeless 0  PHQ - 2 Score 0  Altered sleeping 0  Tired, decreased energy 0  Change in appetite 0  Feeling bad or failure about yourself  0  Trouble concentrating 0  Moving slowly or fidgety/restless 0  Suicidal thoughts 0  PHQ-9 Score 0  Difficult doing work/chores Not difficult at all       04/05/2023    2:33 PM  GAD 7 : Generalized Anxiety Score  Nervous, Anxious, on Edge 0  Control/stop worrying 0  Worry too much - different things 0  Trouble relaxing 0  Restless 0  Easily annoyed or irritable 0  Afraid - awful might happen 0  Total GAD 7 Score 0  Anxiety Difficulty Not difficult at all      Review of Systems  Constitutional:  Negative for chills and fever.  Respiratory:  Negative for shortness of breath.   Cardiovascular:  Negative for chest pain.  Gastrointestinal:  Positive for abdominal pain. Negative for constipation, diarrhea, heartburn, nausea and vomiting.       Left sided abdominal pain.  Genitourinary:  Negative for dysuria, frequency and urgency.  Neurological:  Negative for dizziness and headaches.  Endo/Heme/Allergies:  Negative for polydipsia.  Psychiatric/Behavioral:  Negative for depression and suicidal ideas. The patient is not nervous/anxious.       Objective:     BP 116/62 (BP Location: Left Arm, Patient Position: Sitting, Cuff Size: Normal)   Pulse 85   Temp  97.6 F (36.4 C) (Oral)   Ht 5\' 10"  (1.778 m)   Wt 148 lb (67.1 kg)   SpO2 99%   BMI 21.24 kg/m  BP Readings from Last 3 Encounters:  04/12/23 116/62  04/05/23 108/62  03/30/23 (!) 94/58   Wt Readings from Last 3 Encounters:  04/12/23 148 lb (67.1 kg)  04/05/23 146 lb (66.2 kg)  03/30/23 148 lb 9.6 oz (67.4 kg)      Physical Exam Vitals and nursing note reviewed.  Constitutional:      Appearance: Normal appearance.  Cardiovascular:     Rate and Rhythm: Normal rate and regular rhythm.     Pulses: Normal pulses.     Heart sounds: Normal heart sounds.  Pulmonary:     Effort: Pulmonary effort is normal.     Breath sounds: Normal breath sounds.  Abdominal:     General: Bowel sounds are normal.     Palpations: Abdomen is soft.  Neurological:     Mental Status: He is alert and oriented to person, place, and time.  Psychiatric:        Mood and Affect: Mood normal.  Behavior: Behavior normal.        Thought Content: Thought content normal.        Judgment: Judgment normal.      No results found for any visits on 04/12/23.     The ASCVD Risk score (Arnett DK, et al., 2019) failed to calculate for the following reasons:   Risk score cannot be calculated because patient has a medical history suggesting prior/existing ASCVD    Assessment & Plan:  Type 2 diabetes mellitus with complication, without long-term current use of insulin (HCC) Assessment & Plan: Uncontrolled with a hemoglobin A1c reading of 7.8 on February 16, 2023.  Foot exam completed today. Pneumonia vaccine updated today. Urine ACR pending. Referral placed for diabetic eye exam. Continue rosuvastatin 40 mg daily for CVD risk.  Continue Jardiance 10 mg once daily. Will recheck A1c in May and resume metformin if needed.  Follow-up in 5 weeks.  Orders: -     Microalbumin / creatinine urine ratio  Diabetic eye exam (HCC) -     Ambulatory referral to Ophthalmology  Need for pneumococcal  20-valent conjugate vaccination -     Pneumococcal conjugate vaccine 20-valent  Left lower quadrant abdominal pain Assessment & Plan: Patient continues to have abdominal pain.  Unclear etiology. He has a follow-up scheduled with GI on Friday.  Continue Protonix 40 mg twice daily.  Consider colonoscopy given age and symptoms.     Return in about 5 weeks (around 05/17/2023) for hemoglobin a1c and kidneys.Modesto Charon, NP

## 2023-04-13 ENCOUNTER — Encounter: Payer: Self-pay | Admitting: General Practice

## 2023-04-13 LAB — MICROALBUMIN / CREATININE URINE RATIO
Creatinine,U: 74.1 mg/dL
Microalb Creat Ratio: UNDETERMINED mg/g (ref 0.0–30.0)
Microalb, Ur: <0.7 mg/dL

## 2023-04-14 ENCOUNTER — Ambulatory Visit (INDEPENDENT_AMBULATORY_CARE_PROVIDER_SITE_OTHER): Admitting: Gastroenterology

## 2023-04-14 ENCOUNTER — Encounter: Payer: Self-pay | Admitting: Gastroenterology

## 2023-04-14 ENCOUNTER — Other Ambulatory Visit (HOSPITAL_COMMUNITY): Payer: Self-pay

## 2023-04-14 VITALS — BP 118/62 | HR 64 | Ht 72.0 in | Wt 146.0 lb

## 2023-04-14 DIAGNOSIS — R630 Anorexia: Secondary | ICD-10-CM | POA: Diagnosis not present

## 2023-04-14 DIAGNOSIS — R634 Abnormal weight loss: Secondary | ICD-10-CM | POA: Diagnosis not present

## 2023-04-14 DIAGNOSIS — K5900 Constipation, unspecified: Secondary | ICD-10-CM | POA: Diagnosis not present

## 2023-04-14 DIAGNOSIS — R109 Unspecified abdominal pain: Secondary | ICD-10-CM

## 2023-04-14 DIAGNOSIS — K31A0 Gastric intestinal metaplasia, unspecified: Secondary | ICD-10-CM

## 2023-04-14 MED ORDER — MIRTAZAPINE 15 MG PO TABS
15.0000 mg | ORAL_TABLET | Freq: Every day | ORAL | 2 refills | Status: DC
  Filled 2023-04-14: qty 30, 30d supply, fill #0
  Filled 2023-05-19 – 2023-06-02 (×2): qty 30, 30d supply, fill #1
  Filled 2023-06-25: qty 30, 30d supply, fill #2

## 2023-04-14 NOTE — Patient Instructions (Signed)
 BOWEL PURGE:  This will aid in cleaning out your bowels.   OVER THE COUNTER SHOPPING GUIDE:  Purchase 1 (one) 119 GRAM bottle of Miralax Purchase 1 (one) 32 ounce bottle of Gatorade  STEPS:  Mix the entire bottle of Miralax in the 32 ounces of Gatorade and stir to dissolve completely. Drink the Miralax solution you have prepared. To reduce your risk for nausea, drink slowly and over the next 2-3 hours. You should expect results within 1 to 6 hours after completing this bowel purge.  Start Miralax 1 capful daily in 8 ounces of liquid after completing bowel purge  _______________________________________________________  If your blood pressure at your visit was 140/90 or greater, please contact your primary care physician to follow up on this.  _______________________________________________________  If you are age 23 or older, your body mass index should be between 23-30. Your Body mass index is 19.8 kg/m. If this is out of the aforementioned range listed, please consider follow up with your Primary Care Provider.  If you are age 16 or younger, your body mass index should be between 19-25. Your Body mass index is 19.8 kg/m. If this is out of the aformentioned range listed, please consider follow up with your Primary Care Provider.   ________________________________________________________  The [place] [facility] providers would like to encourage you to use [facility] to communicate with providers for non-urgent requests or questions.  Due to long hold times on the telephone, sending your provider a message by [facility] may be a faster and more efficient way to get a response.  Please allow 48 business hours for a response.  Please remember that this is for non-urgent requests.  _______________________________________________________

## 2023-04-14 NOTE — Progress Notes (Signed)
 04/14/2023 Garrett Roberts [id number] [birth year]   HISTORY OF PRESENT ILLNESS:  This is a 68 year old male who is a patient of Dr. Derek Mound.  He has acute on chronic congestive heart failure with severe ischemic cardiomyopathy.  Current EF about 25%.  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  Now on milrinone and sildenafil.  Has coronary artery disease status post MI October 2023, second MI November 2024 at that time requiring LAD stent.  On Plavix.  Atrial fibrillation paroxysmal, on Eliquis, DM and pulmonary hypertension.  He was just seen by our team while in the hospital two weeks ago.  We were called to see him for findings of abnormal stomach on CT scan.  Had also been complaining of poor appetite and weight loss.  CT scan of the abdomen and pelvis without contrast 03/21/2023:  IMPRESSION: 1. No acute localizing process in the abdomen or pelvis. 2. Questionable gastric wall thickening at the level of the greater curvature, unchanged. Correlate clinically for gastritis. 3. Trace bilateral pleural effusions, left greater than right, with atelectasis in the bilateral lung bases. 4. Distended bladder. 5. Large amount of stool throughout the colon. 6. Aortic atherosclerosis.   Aortic Atherosclerosis (ICD10-I70.0).  He underwent EGD as below.  EGD 03/23/2023:  - Normal esophagus. - 4 cm hiatal hernia. - Granular gastric mucosa. Biopsied. - Normal examined duodenum. - No evidence of malignancy or significant gastritis. - Patient' s poor appetite and weight loss may be related to severe heart failure.  A. STOMACH, BIOPSY:  Gastric mucosa with mild chronic gastritis and focal intestinal  metaplasia.  Immunohistochemistry for Helicobacter pylori is negative.  Negative for dysplasia and malignancy.   He is here today for follow-up.  Having left-sided abdominal pain and constipation.  Also continues with poor appetite and weight loss.   Looks like his weight has been stable over the past couple of weeks, but is definitely down over the past few months.   Past Medical History:  Diagnosis Date   Acute ST elevation myocardial infarction (STEMI) of anterior wall (HCC) 12/10/2022   Acute ST elevation myocardial infarction (STEMI) of inferior wall (HCC) 10/30/2021   Atrial fibrillation (HCC)    Atrial fibrillation with RVR (HCC) 12/19/2022   CHF (congestive heart failure) (HCC)    Coronary artery disease    Diabetes mellitus without complication (HCC)    DM (diabetes mellitus) (HCC)    H. pylori infection    Heart attack (HCC)    HLD (hyperlipidemia)    Hypertension    ST elevation myocardial infarction involving left anterior descending (LAD) coronary artery (HCC) 12/10/2022   Past Surgical History:  Procedure Laterality Date   CARDIOVERSION N/A 12/14/2022   Procedure: CARDIOVERSION;  Surgeon: Little Ishikawa, MD;  Location: [facility] INVASIVE CV LAB;  Service: Cardiovascular;  Laterality: N/A;   CORONARY/GRAFT ACUTE MI REVASCULARIZATION N/A 10/30/2021   Procedure: Coronary/Graft Acute MI Revascularization;  Surgeon: Yates Decamp, MD;  Location: [facility] INVASIVE CV LAB;  Service: Cardiovascular;  Laterality: N/A;   CORONARY/GRAFT ACUTE MI REVASCULARIZATION N/A 12/10/2022   Procedure: Coronary/Graft Acute MI Revascularization;  Surgeon: Tonny Bollman, MD;  Location: [facility] INVASIVE CV LAB;  Service: Cardiovascular;  Laterality: N/A;   ESOPHAGOGASTRODUODENOSCOPY N/A 03/23/2023   Procedure: EGD (ESOPHAGOGASTRODUODENOSCOPY);  Surgeon: Jenel Lucks, MD;  Location: [facility] ENDOSCOPY;  Service: Gastroenterology;  Laterality: N/A;   LEFT HEART CATH AND CORONARY ANGIOGRAPHY N/A 10/30/2021   Procedure: LEFT HEART CATH AND CORONARY ANGIOGRAPHY;  Surgeon: Yates Decamp, MD;  Location: [facility] INVASIVE CV LAB;  Service: Cardiovascular;  Laterality: N/A;   LEFT HEART CATH AND CORONARY ANGIOGRAPHY N/A 12/10/2022   Procedure: LEFT HEART CATH AND  CORONARY ANGIOGRAPHY;  Surgeon: Tonny Bollman, MD;  Location: [facility] INVASIVE CV LAB;  Service: Cardiovascular;  Laterality: N/A;   RIGHT HEART CATH N/A 03/20/2023   Procedure: RIGHT HEART CATH;  Surgeon: Laurey Morale, MD;  Location: [facility] INVASIVE CV LAB;  Service: Cardiovascular;  Laterality: N/A;    reports that he has never smoked. He has never used smokeless tobacco. He reports that he does not drink alcohol and does not use drugs. family history includes Chronic Renal Failure in his brother and father; Diabetes in his brother, brother, father, and mother; Heart attack in his brother; Stroke in his brother. Allergies  Allergen Reactions   Beef-Derived Drug Products Other (See Comments)    Religious observance   Pork-Derived Products Other (See Comments)    Religious observance      Outpatient Encounter Medications as of 04/14/2023  Medication Sig   amiodarone (PACERONE) 200 MG tablet Take 1 tablet (200 mg total) by mouth daily.   apixaban (ELIQUIS) 5 MG TABS tablet Take 1 tablet (5 mg total) by mouth 2 (two) times daily.   Ascorbic Acid (VITAMIN C PO) Take 1 tablet by mouth daily with lunch.   clopidogrel (PLAVIX) 75 MG tablet Take 1 tablet (75 mg total) by mouth daily.   Cyanocobalamin (B-12 PO) Take 1 tablet by mouth daily with lunch.   digoxin (LANOXIN) 0.125 MG tablet Take 1 tablet (0.125 mg total) by mouth daily.   empagliflozin (JARDIANCE) 10 MG TABS tablet Take 1 tablet (10 mg total) by mouth daily before breakfast.   ezetimibe (ZETIA) 10 MG tablet Take 1 tablet (10 mg total) by mouth daily.   levothyroxine (SYNTHROID) 25 MCG tablet Take 1 tablet (25 mcg total) by mouth daily.   MAGNESIUM PO Take 1 tablet by mouth at bedtime.   Multiple Vitamins-Minerals (ZINC PO) Take 1 tablet by mouth daily after lunch.   ondansetron (ZOFRAN-ODT) 4 MG disintegrating tablet Take 1 tablet (4 mg total) by mouth every 8 (eight) hours as needed.   pantoprazole (PROTONIX) 40 MG tablet Take 1 tablet  (40 mg total) by mouth 2 (two) times daily before a meal.   rosuvastatin (CRESTOR) 40 MG tablet Take 1 tablet (40 mg total) by mouth daily.   spironolactone (ALDACTONE) 25 MG tablet Take 1 tablet (25 mg total) by mouth at bedtime.   tadalafil (CIALIS) 20 MG tablet Take 2 tablets (40 mg total) by mouth daily.   torsemide (DEMADEX) 20 MG tablet Take 1 tablet (20 mg total) by mouth every morning.   Vitamin D-Vitamin K (VITAMIN K2-VITAMIN D3 PO) Take 1 tablet by mouth daily with lunch.   No facility-administered encounter medications on file as of 04/14/2023.    REVIEW OF SYSTEMS  : All other systems reviewed and negative except where noted in the History of Present Illness.   PHYSICAL EXAM: BP 118/62   Pulse 64   Ht 6' (1.829 m)   Wt 146 lb (66.2 kg)   BMI 19.80 kg/m  General: Well developed male in no acute distress Head: Normocephalic and atraumatic Eyes:  Sclerae anicteric, conjunctiva pink. Ears: Normal auditory acuity Lungs: Clear throughout to auscultation; no W/R/R. Heart: Regular rate and rhythm; no M/R/R. Abdomen: Soft, non-distended.  BS present.  Non-tender. Musculoskeletal: Symmetrical with no gross deformities  Skin: No lesions  on visible extremities Extremities: No edema  Neurological: Alert oriented x 4, grossly non-focal Psychological:  Alert and cooperative. Normal mood and affect  ASSESSMENT AND PLAN: *Poor appetite and weight loss:  Thought to be possibly related to his significant heart disease per Dr. Milas Hock comments in his EGD report.  Will start mirtazapine 15 mg at bedtime.  Can titrate that up over time if needed.  *Constipation: Likely causing left-sided abdominal pain.  Large amount of stool seen on CT scan.  Will instruct him on the MiraLAX bowel purge, which he should do this evening and then needs to begin MiraLAX daily and titrate upward if needed.  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 Tomasa Rand recommended  against surveillance due to low risk of stomach cancer with this finding and high risk of complications from sedation due to his significant heart disease.  Is on pantoprazole 40 mg BID and should continue that.  *Acute on chronic congestive heart failure with severe ischemic cardiomyopathy.  Current EF about 25%.  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  Now on milrinone and sildenafil.  On admission there was going to be consideration for LVAD.   *Coronary artery disease status post MI October 2023, second MI November 2024 at that time requiring LAD stent.  On Plavix.   *Atrial fibrillation paroxysmal, on Eliquis  *Diabetes mellitus  *Pulmonary hypertension  **Will follow-up in 2 to 3 months.   CC:  Modesto Charon, NP

## 2023-04-18 ENCOUNTER — Other Ambulatory Visit (HOSPITAL_COMMUNITY): Payer: Self-pay

## 2023-04-19 ENCOUNTER — Encounter: Payer: Self-pay | Admitting: Gastroenterology

## 2023-04-19 DIAGNOSIS — R630 Anorexia: Secondary | ICD-10-CM | POA: Insufficient documentation

## 2023-04-19 NOTE — Progress Notes (Signed)
 I agree with the assessment and plan as outlined by Ms. Cristi Loron.

## 2023-04-20 ENCOUNTER — Ambulatory Visit: Payer: Self-pay | Admitting: Pulmonary Disease

## 2023-04-20 NOTE — Progress Notes (Signed)
 " Electrophysiology Office Note:   Date:  04/21/2023  ID:  Garrett Roberts, DOB April 14, 1955, MRN [id number]  Primary Cardiologist: [name], MD Primary Heart Failure: None Electrophysiologist: Fonda Kitty, MD      History of Present Illness:   Garrett Roberts is a 68 y.o. male with h/o PMH of CAD s/p inferior STEMI 10/2021 treated with DES to RCA and recent anterior STEMI on 12/10/2022 s/p DES to LAD, chronic systolic heart failure, paroxysmal AF, DM type II, ICM, CKD stage II, HTN, HLD who is being seen today for follow up. Patient presented to ED on 12/10/22 with chest pain. He was found to have anterior STEMI. He underwent emergent LHC with PCI to prox LAD. Echo was done during that hospitalization and showed newly reduced LVEF of 25%. Course was complicated by AF with RVR. He was started on amiodarone  and underwent TEE/DCCV with conversion to sinus rhythm. He was discharged on oral amiodarone  and Eiquis on 12/15/22.  Discussed the use of AI scribe software for clinical note transcription with the patient, who gave verbal consent to proceed. History of Present Illness Patient was recently seen by Dr. Rolan on 3/6 where he complained of profound fatigue and poor appetite, along with two-pillow orthopnea.  There was concern for low output heart failure, so he was set up for a outpatient right heart cath.  He underwent right heart catheterization on 3/10 which demonstrated a cardiac index of 2.05.  He was directly admitted following his heart catheterization and started on milrinone  and sildenafil  was also added.  He underwent Swan guided optimization.  He was able to be weaned off milrinone  and discharged.  GDMT has been limited by hypotension.  Patient presents today in clinic for follow-up conversation regarding ICD therapy.  He recently saw Dr. Venancio as a posthospital discharge appointment and he recommended ICD.  Patient has no new or acute complaints today.  He reports that  he has been relatively stable, although does mention some lower blood pressures at home, often with systolics in the 90s but occasionally in the 80s. He is on spironolactone  and torsemide , taking torsemide  every other day to manage blood pressure and hydration status. No trouble breathing or swelling in the legs.     Review of systems complete and found to be negative unless listed in HPI.   EP Information / Studies Reviewed:    EKG is not ordered today. EKG from 03/20/23 reviewed which showed sinus rhythm with PR and QRS 96ms.      Echo 02/17/23:   1. No LV thrombus noted. Left ventricular ejection fraction, by  estimation, is 25%. The left ventricle has severely decreased function.  The left ventricle demonstrates global hypokinesis. The left ventricular  internal cavity size was mildly dilated.  Left ventricular diastolic parameters are consistent with Grade III  diastolic dysfunction (restrictive). Elevated left atrial pressure.   2. Right ventricular systolic function is mildly reduced. The right  ventricular size is mildly enlarged. There is moderately elevated  pulmonary artery systolic pressure. The estimated right ventricular  systolic pressure is 59.9 mmHg.   3. Left atrial size was moderately dilated.   4. Right atrial size was mildly dilated.   5. The mitral valve is abnormal. Moderate functional mitral valve  regurgitation. No evidence of mitral stenosis.   6. The aortic valve is tricuspid. Aortic valve regurgitation is not  visualized. No aortic stenosis is present.   7. Pleural effusion noted. A small pericardial effusion is present. The  pericardial effusion is localized near the right ventricle.   8. The inferior vena cava is normal in size with greater than 50%  respiratory variability, suggesting right atrial pressure of 3 mmHg.   Zio patch monitor 14 days 01/25/2023 - 02/08/2023: Dominant rhythm: Sinus. HR 59-91 bpm. Avg HR 69 bpm, in sinus rhythm. 2 episodes  of SVT/atrial tachycardia, fastest and longest at 118 bpm for 15 beats. <1% isolated SVE, couplet/triplets. 0 episodes of VT. <1% isolated VE, couplets. No atrial fibrillation/atrial flutter/VT/high grade AV block, sinus pause >3sec noted. 3 patient triggered events, correlated with SVE/VE.    Risk Assessment/Calculations:    CHA2DS2-VASc Score = 5   This indicates a 7.2% annual risk of stroke. The patient's score is based upon: CHF History: 1 HTN History: 1 Diabetes History: 1 Stroke History: 0 Vascular Disease History: 1 Age Score: 1 Gender Score: 0             Physical Exam:   VS:  BP (!) 96/58   Pulse 81   Ht 6' (1.829 m)   Wt 147 lb 3.2 oz (66.8 kg)   SpO2 99%   BMI 19.96 kg/m    Wt Readings from Last 3 Encounters:  04/21/23 147 lb 3.2 oz (66.8 kg)  04/14/23 146 lb (66.2 kg)  04/12/23 148 lb (67.1 kg)     GEN: Well developed, in no acute distress NECK: No JVD CARDIAC: Normal rate, regular rhythm.  RESPIRATORY:  Clear to auscultation without rales, wheezing or rhonchi  ABDOMEN: Soft, non-distended EXTREMITIES:  No edema; No deformity   ASSESSMENT AND PLAN:    #Chronic systolic heart failure: EF 25%. NYHA II-III symptoms. Well compensated on exam today. His symptoms of loss of appetite, weight loss, shortness of breath are concerning for a chronically low output state.  #Ischemic cardiomyopathy: #CAD s/p RCA STEMI in 2023 and LAD STEMI in 2024: - EF remains <35% despite optimal medical therapy and >90 days post revasc.  Guideline directed medical therapy has been limited by hypotension.  He meets criteria for primary prevention ICD. Explained risks, benefits, and alternatives to ICD implantation, including but not limited to bleeding, infection, damage to heart or lungs, heart attack, stroke, or death.  Pt verbalized understanding and would like to proceed.  We will arrange for VDD ICD implant. - Continue GDMT regimen of empagliflozin  10 mg once daily and  spironolactone  25 mg once daily and follow up with Dr. Rolan.   #Symptomatic paroxysmal atrial fibrillation: Given his degree of LV dysfunction and hypotension with most recent episode, I think rhythm control strategy would be best at this time. He appears to be maintaining sinus with amiodarone .  #High risk medication use: LFTs normal on 03/07/23. TSH elevated in February 2025 with normal T4, and no symptoms of hypothyroidism.  Normal LFTs in March 2025. - Continue amiodarone  for now. Currently not a candidate for catheter ablation. Tikosyn would be an option to get off amiodarone  in the long-term, but may provide less protection for ventricular arrhthymias in the setting of large ischemic scar burden. - Continue Eliquis .  - Monitor AF burden with ICD.  #Secondary hypercoagulable state due to atrial fibrillation:  -CHADSVASC score of 5. -Continue Eliquis .   Signed, Fonda Kitty, MD  "

## 2023-04-21 ENCOUNTER — Encounter: Payer: Self-pay | Admitting: Cardiology

## 2023-04-21 ENCOUNTER — Ambulatory Visit: Attending: Cardiology | Admitting: Cardiology

## 2023-04-21 ENCOUNTER — Ambulatory Visit: Payer: Self-pay | Admitting: Cardiology

## 2023-04-21 ENCOUNTER — Other Ambulatory Visit: Payer: Self-pay

## 2023-04-21 VITALS — BP 96/58 | HR 81 | Ht 72.0 in | Wt 147.2 lb

## 2023-04-21 DIAGNOSIS — I5022 Chronic systolic (congestive) heart failure: Secondary | ICD-10-CM | POA: Diagnosis present

## 2023-04-21 DIAGNOSIS — I480 Paroxysmal atrial fibrillation: Secondary | ICD-10-CM | POA: Diagnosis present

## 2023-04-21 DIAGNOSIS — I2510 Atherosclerotic heart disease of native coronary artery without angina pectoris: Secondary | ICD-10-CM | POA: Diagnosis present

## 2023-04-21 DIAGNOSIS — Z79899 Other long term (current) drug therapy: Secondary | ICD-10-CM | POA: Insufficient documentation

## 2023-04-21 DIAGNOSIS — D6869 Other thrombophilia: Secondary | ICD-10-CM | POA: Insufficient documentation

## 2023-04-21 DIAGNOSIS — I255 Ischemic cardiomyopathy: Secondary | ICD-10-CM | POA: Diagnosis present

## 2023-04-21 NOTE — Patient Instructions (Signed)
 Medication Instructions:  Your physician recommends that you continue on your current medications as directed. Please refer to the Current Medication list given to you today.  *If you need a refill on your cardiac medications before your next appointment, please call your pharmacy*  Lab Work: BMET and CBC If you have labs (blood work) drawn today and your tests are completely normal, you will receive your results only by: MyChart Message (if you have MyChart) OR A paper copy in the mail If you have any lab test that is abnormal or we need to change your treatment, we will call you to review the results.  Testing/Procedures: ICD Implant  Your physician has recommended that you have a defibrillator inserted. An implantable cardioverter defibrillator (ICD) is a small device that is placed in your chest or, in rare cases, your abdomen. This device uses electrical pulses or shocks to help control life-threatening, irregular heartbeats that could lead the heart to suddenly stop beating (sudden cardiac arrest). Leads are attached to the ICD that goes into your heart. This is done in the hospital and usually requires an overnight stay. Please see the instruction sheet given to you today for more information.  Follow-Up: At [facility], you and your health needs are our priority.  As part of our continuing mission to provide you with exceptional heart care, our providers are all part of one team.  This team includes your primary Cardiologist (physician) and Advanced Practice Providers or APPs (Physician Assistants and Nurse Practitioners) who all work together to provide you with the care you need, when you need it.  Your next appointment:   We will call you to schedule your post-procedure appointments        1st Floor: - Lobby - Registration  - Pharmacy  - Lab - Cafe  2nd Floor: - PV Lab - Diagnostic Testing (echo, CT, nuclear med)  3rd Floor: - Vacant  4th Floor: - TCTS  (cardiothoracic surgery) - AFib Clinic - Structural Heart Clinic - Vascular Surgery  - Vascular Ultrasound  5th Floor: - HeartCare Cardiology (general and EP) - Clinical Pharmacy for coumadin, hypertension, lipid, weight-loss medications, and med management appointments    Valet parking services will be available as well.

## 2023-04-24 ENCOUNTER — Other Ambulatory Visit (HOSPITAL_COMMUNITY): Payer: Self-pay

## 2023-05-02 ENCOUNTER — Ambulatory Visit (HOSPITAL_COMMUNITY)
Admission: RE | Admit: 2023-05-02 | Discharge: 2023-05-02 | Disposition: A | Discharge status: Home / Self Care | Source: Ambulatory Visit | Attending: Family Medicine | Admitting: Family Medicine

## 2023-05-02 ENCOUNTER — Other Ambulatory Visit: Payer: Self-pay

## 2023-05-02 ENCOUNTER — Encounter (HOSPITAL_COMMUNITY)
Admission: RE | Admit: 2023-05-02 | Discharge: 2023-05-02 | Disposition: A | Discharge status: Home / Self Care | Source: Ambulatory Visit | Attending: Cardiology | Admitting: Cardiology

## 2023-05-02 ENCOUNTER — Other Ambulatory Visit (HOSPITAL_COMMUNITY): Payer: Self-pay

## 2023-05-02 ENCOUNTER — Encounter (HOSPITAL_COMMUNITY): Payer: Self-pay

## 2023-05-02 VITALS — BP 108/72 | HR 76 | Wt 152.8 lb

## 2023-05-02 DIAGNOSIS — Z79899 Other long term (current) drug therapy: Secondary | ICD-10-CM | POA: Insufficient documentation

## 2023-05-02 DIAGNOSIS — E119 Type 2 diabetes mellitus without complications: Secondary | ICD-10-CM | POA: Diagnosis not present

## 2023-05-02 DIAGNOSIS — Z6820 Body mass index (BMI) 20.0-20.9, adult: Secondary | ICD-10-CM | POA: Diagnosis not present

## 2023-05-02 DIAGNOSIS — Z833 Family history of diabetes mellitus: Secondary | ICD-10-CM | POA: Insufficient documentation

## 2023-05-02 DIAGNOSIS — Z7901 Long term (current) use of anticoagulants: Secondary | ICD-10-CM | POA: Diagnosis not present

## 2023-05-02 DIAGNOSIS — Z7984 Long term (current) use of oral hypoglycemic drugs: Secondary | ICD-10-CM | POA: Diagnosis not present

## 2023-05-02 DIAGNOSIS — I480 Paroxysmal atrial fibrillation: Secondary | ICD-10-CM | POA: Diagnosis not present

## 2023-05-02 DIAGNOSIS — Z7902 Long term (current) use of antithrombotics/antiplatelets: Secondary | ICD-10-CM | POA: Insufficient documentation

## 2023-05-02 DIAGNOSIS — Z8249 Family history of ischemic heart disease and other diseases of the circulatory system: Secondary | ICD-10-CM | POA: Diagnosis not present

## 2023-05-02 DIAGNOSIS — I2510 Atherosclerotic heart disease of native coronary artery without angina pectoris: Secondary | ICD-10-CM | POA: Insufficient documentation

## 2023-05-02 DIAGNOSIS — R6881 Early satiety: Secondary | ICD-10-CM | POA: Diagnosis not present

## 2023-05-02 DIAGNOSIS — I272 Pulmonary hypertension, unspecified: Secondary | ICD-10-CM

## 2023-05-02 DIAGNOSIS — I252 Old myocardial infarction: Secondary | ICD-10-CM | POA: Diagnosis not present

## 2023-05-02 DIAGNOSIS — I5022 Chronic systolic (congestive) heart failure: Secondary | ICD-10-CM | POA: Insufficient documentation

## 2023-05-02 DIAGNOSIS — D649 Anemia, unspecified: Secondary | ICD-10-CM | POA: Insufficient documentation

## 2023-05-02 DIAGNOSIS — I110 Hypertensive heart disease with heart failure: Secondary | ICD-10-CM | POA: Insufficient documentation

## 2023-05-02 DIAGNOSIS — R630 Anorexia: Secondary | ICD-10-CM

## 2023-05-02 DIAGNOSIS — Z955 Presence of coronary angioplasty implant and graft: Secondary | ICD-10-CM | POA: Diagnosis not present

## 2023-05-02 DIAGNOSIS — I255 Ischemic cardiomyopathy: Secondary | ICD-10-CM | POA: Insufficient documentation

## 2023-05-02 DIAGNOSIS — R634 Abnormal weight loss: Secondary | ICD-10-CM | POA: Insufficient documentation

## 2023-05-02 LAB — BASIC METABOLIC PANEL WITH GFR
Anion gap: 11 (ref 5–15)
BUN: 19 mg/dL (ref 8–23)
CO2: 25 mmol/L (ref 22–32)
Calcium: 9.6 mg/dL (ref 8.9–10.3)
Chloride: 99 mmol/L (ref 98–111)
Creatinine, Ser: 1.28 mg/dL — ABNORMAL HIGH (ref 0.61–1.24)
GFR, Estimated: >60 mL/min (ref >60)
Glucose, Bld: 208 mg/dL — ABNORMAL HIGH (ref 70–99)
Potassium: 4.7 mmol/L (ref 3.5–5.1)
Sodium: 135 mmol/L (ref 135–145)

## 2023-05-02 LAB — BRAIN NATRIURETIC PEPTIDE: B Natriuretic Peptide: 1277.6 pg/mL — ABNORMAL HIGH (ref 0.0–100.0)

## 2023-05-02 LAB — DIGOXIN LEVEL: Digoxin Level: 0.7 ng/mL — ABNORMAL LOW (ref 0.8–2.0)

## 2023-05-02 MED ORDER — SODIUM CHLORIDE 0.9 % IV SOLN
510.0000 mg | Freq: Once | INTRAVENOUS | Status: AC
  Administered 2023-05-02: 510 mg via INTRAVENOUS
  Filled 2023-05-02: qty 510

## 2023-05-02 MED ORDER — POTASSIUM CHLORIDE CRYS ER 20 MEQ PO TBCR
20.0000 meq | EXTENDED_RELEASE_TABLET | Freq: Every day | ORAL | 3 refills | Status: DC
  Filled 2023-05-02 – 2023-05-19 (×2): qty 90, 90d supply, fill #0

## 2023-05-02 MED ORDER — FERUMOXYTOL INJECTION 510 MG/17 ML
510.0000 mg | Freq: Once | INTRAVENOUS | Status: DC
  Filled 2023-05-02: qty 17

## 2023-05-02 MED ORDER — TORSEMIDE 20 MG PO TABS
40.0000 mg | ORAL_TABLET | Freq: Every morning | ORAL | 3 refills | Status: DC
  Filled 2023-05-02: qty 180, 90d supply, fill #0
  Filled 2023-05-02 – 2023-05-19 (×2): qty 60, 30d supply, fill #0

## 2023-05-02 NOTE — Progress Notes (Signed)
 Advanced Heart Failure Clinic Note  PCP: Jolanda Nation, NP HF Cardiology: Dr. Mitzie Anda  Chief complaint: CHF  68 y.o. with history of CAD, DM II, HTN, HLD, ischemic cardiomyopathy/chronic systolic CHF.   Patient had inferior STEMI 10/23 s/p PCI/DES to RCA. Had residual diffuse disease in diagonals and OMs. EF 40-45% at time of cath. Subsequent echo in 10/23 with EF 55-60%.   He was not taking medications in 2024 after losing insurance. Presented with anterior STEMI 12/10/22. Cardiac cath with 100% proximal LAD treated with PCI/DES. Prior RCA stent patent, severe disease in tortuous ramus and distal LCx treated medically.  Echo with EF 25%, no LV thrombus, AK mid septum to apex, moderately reduced RV systolic function. Course complicated by atrial fibrillation with RVR. Started on IV amiodarone  and underwent DCCV to SR.  GDMT limited by soft blood pressure. Also seen by GI for abdominal pain. Had thickening of stomach on CT scan. H. Pylori later resulted positive after discharge, seen by GI and started on treatment for H pylori-related gastritis.   Returned to ED 12/19/22 with recurrent atrial fibrillation. He converted to SR while in waiting room and was discharged home.  Admitted 2/25 with acute CHF. Diuresed with IV lasix . GDMT cut back 2/2 to low BP, Entresto  and spironolactone  stopped. He was discharged home, weight 154 lbs.  Echo in 2/25 showed EF 25% with mild RV dysfunction and mild RV enlargement, PASP 60 mmHg, moderate MR.   He was seen again in the ER 03/14/23 with worsening fatigue and dyspnea.  Torsemide  was increased to 40 qam/20 qpm.   Patient was set up for RHC in 3/25 due to ongoing severe fatigue. This showed low CI 2.02 by thermodilution as well as moderate pulmonary arterial hypertension, he was admitted and started on milrinone  gtt.  He was gradually titrated off milrinone  and maintained a good co-ox off milrinone .  He had a moderate right pleural effusion, underwent right  thoracentesis.  Fluid was transudative but grew E coli, he was treated with Bactrim  based on sensitivities.  Given early satiety and poor appetite, he had an EGD that showed gastritic, biopsies showed no H pylori or gastric cancer.  HRCT chest showed bibasilar bronchiectasis, ?aspiration. However, swallow study was not suggestive of aspiration. V/Q scan was done due to pulmonary hypertension, this showed no chronic PE.  He was started on sildenafil  and meds were titrated, he felt better when he went home.   Echo 3/25 showed EF 25-30%, mild RV dysfunction, moderate MR, PASP 62 mmHg.   Today he returns for HF follow up. Overall feeling fair, breathing is heavier, has new orthopnea. Occasional positional dizziness. Denies palpitations, abnormal bleeding, CP, edema, or PND. He has a poor appetite, following with GI. No fever or chills. Weight at home 147 pounds. Taking all medications. He has taste disturbances since diagnosed with HF.  ReDs reading: 44 %, abnormal   Labs (12/24): hgb 15.1, BNP 350, K 4.4, creatinine 1.24 Labs (2/25): K 4.1, creatinine 1.48, BNP 481, LFTs normal, free T3/T4 normal Labs (3/25): K 3.5, creatinine 1.44 => 1.27, hgb 13.4, BNP 812, HS-TnI 44 => 39  SH: No ETOH or tobacco use.  Lives at home in [place] with his wife and children. Originally from [place].   PMH: 1. Chronic systolic CHF: Ischemic cardiomyopathy.   - Echo (12/24): EF 20-25%, LAD territory WMAs, moderate LVH, moderately decreased RV systolic function, mild MR.  - Echo (2/25): EF 25% with mild RV dysfunction and mild RV  enlargement, PASP 60 mmHg, moderate MR.  - RHC (3/25): mean RA 2, PA 60/18 mean 31, mean PCWP 17, CI 2.02 thermo, CI 2.52 Fick, PAPi 21, PVR 3.7 WU.  - Echo (3/25): EF 25-30%, mild RV dysfunction, moderate MR, PASP 62 mmHg.  2. CAD: Inferior STEMI 10/23 with DES to RCA.  - Anterior STEMI (11/24): Occluded proximal LAD treated with DES, occluded D2, 90% OM1, 95% distal LCx.  Distal LCx and OM1  treated medically.  3. Atrial fibrillation: Paroxysmal.  - DCCV in 12/24.  4. H pylori gastritis 5. Hyperlipidemia 6. Type 2 diabetes 7. Pulmonary hypertension: RHC (3/25) with mean RA 2, PA 60/18 mean 31, mean PCWP 17, CI 2.02 thermo, CI 2.52 Fick, PAPi 21, PVR 3.7 WU. - V/Q scan (3/25): No chronic PE.  - HRCT chest (3/25): bibasilar bronchiectasis, ?aspiration.  - ANA, RF, anti-SCL70 Ab, anti-centromere Ab all negative.  8. Pleural effusion: On right, s/p thoracentesis.   Current Outpatient Medications  Medication Sig Dispense Refill   amiodarone  (PACERONE ) 200 MG tablet Take 1 tablet (200 mg total) by mouth daily. 30 tablet 5   apixaban  (ELIQUIS ) 5 MG TABS tablet Take 1 tablet (5 mg total) by mouth 2 (two) times daily.     Ascorbic Acid (VITAMIN C PO) Take 1 tablet by mouth daily with lunch.     clopidogrel  (PLAVIX ) 75 MG tablet Take 1 tablet (75 mg total) by mouth daily. 30 tablet 2   Cyanocobalamin (B-12 PO) Take 1 tablet by mouth daily with lunch.     digoxin  (LANOXIN ) 0.125 MG tablet Take 1 tablet (0.125 mg total) by mouth daily. 90 tablet 3   empagliflozin  (JARDIANCE ) 10 MG TABS tablet Take 1 tablet (10 mg total) by mouth daily before breakfast.     ezetimibe  (ZETIA ) 10 MG tablet Take 1 tablet (10 mg total) by mouth daily. 90 tablet 3   levothyroxine  (SYNTHROID ) 25 MCG tablet Take 1 tablet (25 mcg total) by mouth daily. 60 tablet 0   MAGNESIUM PO Take 1 tablet by mouth at bedtime.     mirtazapine  (REMERON ) 15 MG tablet Take 1 tablet (15 mg total) by mouth at bedtime. 30 tablet 2   Multiple Vitamins-Minerals (ZINC PO) Take 1 tablet by mouth daily after lunch.     ondansetron  (ZOFRAN -ODT) 4 MG disintegrating tablet Take 1 tablet (4 mg total) by mouth every 8 (eight) hours as needed. 20 tablet 0   pantoprazole  (PROTONIX ) 40 MG tablet Take 1 tablet (40 mg total) by mouth 2 (two) times daily before a meal. 180 tablet 3   rosuvastatin  (CRESTOR ) 40 MG tablet Take 1 tablet (40 mg total)  by mouth daily. 90 tablet 3   spironolactone  (ALDACTONE ) 25 MG tablet Take 1 tablet (25 mg total) by mouth at bedtime. 90 tablet 3   tadalafil  (CIALIS ) 20 MG tablet Take 2 tablets (40 mg total) by mouth daily. 90 tablet 3   torsemide  (DEMADEX ) 20 MG tablet Take 1 tablet (20 mg total) by mouth every morning. 30 tablet 5   Vitamin D-Vitamin K (VITAMIN K2-VITAMIN D3 PO) Take 1 tablet by mouth daily with lunch.     No current facility-administered medications for this encounter.   Allergies  Allergen Reactions   Beef-Derived Drug Products Other (See Comments)    Religious observance   Pork-Derived Products Other (See Comments)    Religious observance   Family History  Problem Relation Age of Onset   Diabetes Mother    Chronic Renal Failure Father  Diabetes Father    Chronic Renal Failure Brother    Diabetes Brother    Diabetes Brother    Heart attack Brother    Stroke Brother    Wt Readings from Last 3 Encounters:  05/02/23 69.3 kg (152 lb 12.8 oz)  04/21/23 66.8 kg (147 lb 3.2 oz)  04/14/23 66.2 kg (146 lb)   BP 108/72   Pulse 76   Wt 69.3 kg (152 lb 12.8 oz)   SpO2 100%   BMI 20.72 kg/m   PHYSICAL EXAM: General:  NAD. No resp difficulty, walked into clinic, fatigued-appearing HEENT: Normal Neck: Supple. JVP 10 Cor: Regular rate & rhythm. No rubs, gallops or murmurs. Lungs: Clear Abdomen: Soft, nontender, nondistended.  Extremities: No cyanosis, clubbing, rash, edema Neuro: Alert & oriented x 3, moves all 4 extremities w/o difficulty. Affect pleasant.  ASSESSMENT & PLAN: 1. Chronic systolic CHF/ischemic cardiomyopathy:  Echo 12/24 with EF 20-25%, no LV thrombus, AK mid septum to apex, moderately reduced RV systolic function. Echo 2/25 with EF 25%, mildly reduced RV function.  Low BP has limited GDMT titration.  His profound fatigue has been concerning for low output HF. RHC 3/25 showed low CI by thermo (2.05) but preserved by Fick.  Borderline elevated PCWP, normal RA  pressure.  There was also moderate mixed pulmonary arterial/pulmonary venous hypertension.  With his significant symptoms, he was started on milrinone  0.25 and added sildenafil  to try to lower PA pressure.  Milrinone  was later weaned off with medication adjustment, and co-ox remained excellent.  Repeat echo 3/25 showed EF 25-30%, mild RV dysfunction, moderate MR, PASP 62 mmHg.  Dr. Mitzie Anda discussed LVAD during last admission.  I think he is not quite there yet but we are going to need to follow him very closely. He was able to wean off milrinone  at last admission without much difficulty. Today, worse NYHA IIb-III symptoms, he is volume overloaded on exam, weight up 6 lbs, ReDs 44% - Increase torsemide  to 40 mg daily, add 20 KCL daily.  BMET/ BNP today, repeat  BMET in 10-14 days. - Continue Jardiance  10 mg daily. - Continue digoxin  0.125 mg daily. Check digoxin  level today. - off Toprol  XL with recent low output.  - Continue spironolactone  25 mg daily.    - He has seen EP and planning for ICD next month.   2. CAD:  Hx inferior STEMI 10/23 s/p PCI/DES to RCA. Anterior STEMI 12/10/22, cath showed occluded pLAD treated with PCI/DES, prior RCA stent patent, severe disease OM1 and distal LCx treated medically. No chest pain.  - Continue Plavix .   - No ASA given Eliquis  use.  - Continue rosuvastatin  40 mg + Zetia  10 mg daily. 3. Atrial fibrillation: Paroxysmal. Regular on exam today. - Continue Eliquis  5 mg bid. No bleeding issues. - Continue amiodarone  200 mg daily, amiodarone  labs are UTD. He will need a regular eye exam.  4. DM II: Per PCP - No change. 5. 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 Not a smoker, no known lung disease. CXR has shown some chronic interstitial changes. HRCT chest with bibasilar bronchiectasis that may be due to aspiration (though swallow study negative).  V/Q scan showed no chronic PE. Rheumatologic serologies negative -  Continue tadalafil  40 mg daily.   6. Pleural effusion: Moderate on right. S/p right thoracentesis 3/25. Pleural fluid was transudative by Light's criteria, no malignant cells. However, it grew E coli.  7. GI: Has had significant weight loss, early satiety, GI discomfort.  CT abdomen/pelvis with gastric wall thickening. GI was consulted and EGD was performed, no definite evidence for malignancy, biopsies taken for H pylori testing and pathology => negative for H pylori and no gastric cancer. Heart failure may play a role in the symptoms. ? if dysgeusia related to his amiodarone . - Continue PPI - Continue GI follow up  Follow up in 3 weeks with APP for fluid check.   Arlice Bene [facility] FNP-BC 05/02/23

## 2023-05-02 NOTE — Patient Instructions (Signed)
 Increase torsemide  to 40 mg daily - Rx sent. Start potassium 20 meq daily - Rx sent. Repeat labs in 10 - 14 days. See below. Labs today - will call you if abnormal. Return to Heart Failure APP Clinic in 3 weeks - see below. Please call us  at [phone] if any questions or concerns prior to your next appointment.

## 2023-05-02 NOTE — Progress Notes (Signed)
 ReDS Vest / Clip - 05/02/23 1100       ReDS Vest / Clip   Station Marker C    Ruler Value 26    ReDS Value Range High volume overload    ReDS Actual Value 44

## 2023-05-12 ENCOUNTER — Ambulatory Visit (HOSPITAL_COMMUNITY)
Admission: RE | Admit: 2023-05-12 | Discharge: 2023-05-12 | Disposition: A | Discharge status: Home / Self Care | Source: Ambulatory Visit | Attending: Internal Medicine | Admitting: Internal Medicine

## 2023-05-12 ENCOUNTER — Other Ambulatory Visit (HOSPITAL_COMMUNITY): Payer: Self-pay

## 2023-05-12 DIAGNOSIS — I2510 Atherosclerotic heart disease of native coronary artery without angina pectoris: Secondary | ICD-10-CM | POA: Insufficient documentation

## 2023-05-12 LAB — BASIC METABOLIC PANEL WITH GFR
Anion gap: 10 (ref 5–15)
BUN: 19 mg/dL (ref 8–23)
CO2: 26 mmol/L (ref 22–32)
Calcium: 9.3 mg/dL (ref 8.9–10.3)
Chloride: 99 mmol/L (ref 98–111)
Creatinine, Ser: 1.55 mg/dL — ABNORMAL HIGH (ref 0.61–1.24)
GFR, Estimated: 49 mL/min — ABNORMAL LOW (ref >60)
Glucose, Bld: 197 mg/dL — ABNORMAL HIGH (ref 70–99)
Potassium: 3.6 mmol/L (ref 3.5–5.1)
Sodium: 135 mmol/L (ref 135–145)

## 2023-05-15 ENCOUNTER — Ambulatory Visit: Payer: Self-pay | Admitting: General Practice

## 2023-05-19 ENCOUNTER — Other Ambulatory Visit (HOSPITAL_COMMUNITY): Payer: Self-pay

## 2023-05-19 ENCOUNTER — Telehealth (HOSPITAL_COMMUNITY): Payer: Self-pay

## 2023-05-19 NOTE — Telephone Encounter (Signed)
 Attempted to reach patient to discuss upcoming procedure, no answer. Left VM for patient to return call.

## 2023-05-22 ENCOUNTER — Telehealth (HOSPITAL_COMMUNITY): Payer: Self-pay

## 2023-05-22 DIAGNOSIS — Z419 Encounter for procedure for purposes other than remedying health state, unspecified: Secondary | ICD-10-CM | POA: Diagnosis not present

## 2023-05-22 NOTE — Telephone Encounter (Signed)
 Called to confirm/remind patient of their appointment at the Advanced Heart Failure Clinic on 05/23/2023 0900.   Appointment:   [x] Confirmed  Patient reminded to bring all medications and/or complete list.  Confirmed patient has transportation. Gave directions, instructed to utilize valet parking.

## 2023-05-22 NOTE — Progress Notes (Incomplete)
 Advanced [facility] Failure Clinic Note  PCP: Jolanda Nation, NP HF Cardiology: Dr. Mitzie Anda  Chief complaint: CHF  68 y.o. with history of CAD, DM II, HTN, HLD, ischemic cardiomyopathy/chronic systolic CHF.   Patient had inferior STEMI 10/23 s/p PCI/DES to RCA. Had residual diffuse disease in diagonals and OMs. EF 40-45% at time of cath. Subsequent echo in 10/23 with EF 55-60%.   He was not taking medications in 2024 after losing insurance. Presented with anterior STEMI 12/10/22. Cardiac cath with 100% proximal LAD treated with PCI/DES. Prior RCA stent patent, severe disease in tortuous ramus and distal LCx treated medically.  Echo with EF 25%, no LV thrombus, AK mid septum to apex, moderately reduced RV systolic function. Course complicated by atrial fibrillation with RVR. Started on IV amiodarone  and underwent DCCV to SR.  GDMT limited by soft blood pressure. Also seen by GI for abdominal pain. Had thickening of stomach on CT scan. H. Pylori later resulted positive after discharge, seen by GI and started on treatment for H pylori-related gastritis.   Returned to ED 12/19/22 with recurrent atrial fibrillation. He converted to SR while in waiting room and was discharged home.  Admitted 2/25 with acute CHF. Diuresed with IV lasix . GDMT cut back 2/2 to low BP, Entresto  and spironolactone  stopped. He was discharged home, weight 154 lbs.  Echo in 2/25 showed EF 25% with mild RV dysfunction and mild RV enlargement, PASP 60 mmHg, moderate MR.   He was seen again in the ER 03/14/23 with worsening fatigue and dyspnea.  Torsemide  was increased to 40 qam/20 qpm.   Patient was set up for RHC in 3/25 due to ongoing severe fatigue. This showed low CI 2.02 by thermodilution as well as moderate pulmonary arterial hypertension, he was admitted and started on milrinone  gtt.  He was gradually titrated off milrinone  and maintained a good co-ox off milrinone .  He had a moderate right pleural effusion, underwent right  thoracentesis.  Fluid was transudative but grew E coli, he was treated with Bactrim  based on sensitivities.  Given early satiety and poor appetite, he had an EGD that showed gastritic, biopsies showed no H pylori or gastric cancer.  HRCT chest showed bibasilar bronchiectasis, ?aspiration. However, swallow study was not suggestive of aspiration. V/Q scan was done due to pulmonary hypertension, this showed no chronic PE.  He was started on sildenafil  and meds were titrated, he felt better when he went home.   Echo 3/25 showed EF 25-30%, mild RV dysfunction, moderate MR, PASP 62 mmHg.   Today he returns for HF follow up. Overall feeling fair, breathing is heavier, has new orthopnea. Occasional positional dizziness. Denies palpitations, abnormal bleeding, CP, edema, or PND. He has a poor appetite, following with GI. No fever or chills. Weight at home 147 pounds. Taking all medications. He has taste disturbances since diagnosed with HF.  ReDs reading: 44 %, abnormal   Labs (12/24): hgb 15.1, BNP 350, K 4.4, creatinine 1.24 Labs (2/25): K 4.1, creatinine 1.48, BNP 481, LFTs normal, free T3/T4 normal Labs (3/25): K 3.5, creatinine 1.44 => 1.27, hgb 13.4, BNP 812, HS-TnI 44 => 39  SH: No ETOH or tobacco use.  Lives at home in [place] with his wife and children. Originally from [place].   PMH: 1. Chronic systolic CHF: Ischemic cardiomyopathy.   - Echo (12/24): EF 20-25%, LAD territory WMAs, moderate LVH, moderately decreased RV systolic function, mild MR.  - Echo (2/25): EF 25% with mild RV dysfunction and mild RV  enlargement, PASP 60 mmHg, moderate MR.  - RHC (3/25): mean RA 2, PA 60/18 mean 31, mean PCWP 17, CI 2.02 thermo, CI 2.52 Fick, PAPi 21, PVR 3.7 WU.  - Echo (3/25): EF 25-30%, mild RV dysfunction, moderate MR, PASP 62 mmHg.  2. CAD: Inferior STEMI 10/23 with DES to RCA.  - Anterior STEMI (11/24): Occluded proximal LAD treated with DES, occluded D2, 90% OM1, 95% distal LCx.  Distal LCx and OM1  treated medically.  3. Atrial fibrillation: Paroxysmal.  - DCCV in 12/24.  4. H pylori gastritis 5. Hyperlipidemia 6. Type 2 diabetes 7. Pulmonary hypertension: RHC (3/25) with mean RA 2, PA 60/18 mean 31, mean PCWP 17, CI 2.02 thermo, CI 2.52 Fick, PAPi 21, PVR 3.7 WU. - V/Q scan (3/25): No chronic PE.  - HRCT chest (3/25): bibasilar bronchiectasis, ?aspiration.  - ANA, RF, anti-SCL70 Ab, anti-centromere Ab all negative.  8. Pleural effusion: On right, s/p thoracentesis.   Current Outpatient Medications  Medication Sig Dispense Refill   amiodarone  (PACERONE ) 200 MG tablet Take 1 tablet (200 mg total) by mouth daily. 30 tablet 5   apixaban  (ELIQUIS ) 5 MG TABS tablet Take 1 tablet (5 mg total) by mouth 2 (two) times daily.     Ascorbic Acid (VITAMIN C PO) Take 1 tablet by mouth daily with lunch.     clopidogrel  (PLAVIX ) 75 MG tablet Take 1 tablet (75 mg total) by mouth daily. 30 tablet 2   Cyanocobalamin (B-12 PO) Take 1 tablet by mouth daily with lunch.     digoxin  (LANOXIN ) 0.125 MG tablet Take 1 tablet (0.125 mg total) by mouth daily. 90 tablet 3   empagliflozin  (JARDIANCE ) 10 MG TABS tablet Take 1 tablet (10 mg total) by mouth daily before breakfast.     ezetimibe  (ZETIA ) 10 MG tablet Take 1 tablet (10 mg total) by mouth daily. 90 tablet 3   levothyroxine  (SYNTHROID ) 25 MCG tablet Take 1 tablet (25 mcg total) by mouth daily. 60 tablet 0   MAGNESIUM PO Take 1 tablet by mouth at bedtime.     mirtazapine  (REMERON ) 15 MG tablet Take 1 tablet (15 mg total) by mouth at bedtime. 30 tablet 2   ondansetron  (ZOFRAN -ODT) 4 MG disintegrating tablet Take 1 tablet (4 mg total) by mouth every 8 (eight) hours as needed. 20 tablet 0   pantoprazole  (PROTONIX ) 40 MG tablet Take 1 tablet (40 mg total) by mouth 2 (two) times daily before a meal. 180 tablet 3   potassium chloride  SA (KLOR-CON  M) 20 MEQ tablet Take 1 tablet (20 mEq total) by mouth daily. 90 tablet 3   rosuvastatin  (CRESTOR ) 40 MG tablet Take  40 mg by mouth daily.     spironolactone  (ALDACTONE ) 25 MG tablet Take 1 tablet (25 mg total) by mouth at bedtime. 90 tablet 3   tadalafil  (CIALIS ) 20 MG tablet Take 2 tablets (40 mg total) by mouth daily. 90 tablet 3   torsemide  (DEMADEX ) 20 MG tablet Take 2 tablets (40 mg total) by mouth every morning. 180 tablet 3   Vitamin D-Vitamin K (VITAMIN K2-VITAMIN D3 PO) Take 1 tablet by mouth daily with lunch.     zinc gluconate 50 MG tablet Take 50 mg by mouth daily with lunch.     No current facility-administered medications for this visit.   Allergies  Allergen Reactions   Beef-Derived Drug Products Other (See Comments)    Religious observance   Pork-Derived Products Other (See Comments)    Religious observance  Family History  Problem Relation Age of Onset   Diabetes Mother    Chronic Renal Failure Father    Diabetes Father    Chronic Renal Failure Brother    Diabetes Brother    Diabetes Brother    Heart attack Brother    Stroke Brother    Wt Readings from Last 3 Encounters:  05/02/23 69.3 kg (152 lb 12.8 oz)  04/21/23 66.8 kg (147 lb 3.2 oz)  04/14/23 66.2 kg (146 lb)   There were no vitals taken for this visit.  PHYSICAL EXAM: General:  NAD. No resp difficulty, walked into clinic, fatigued-appearing HEENT: Normal Neck: Supple. JVP 10 Cor: Regular rate & rhythm. No rubs, gallops or murmurs. Lungs: Clear Abdomen: Soft, nontender, nondistended.  Extremities: No cyanosis, clubbing, rash, edema Neuro: Alert & oriented x 3, moves all 4 extremities w/o difficulty. Affect pleasant.  ASSESSMENT & PLAN: 1. Chronic systolic CHF/ischemic cardiomyopathy:  Echo 12/24 with EF 20-25%, no LV thrombus, AK mid septum to apex, moderately reduced RV systolic function. Echo 2/25 with EF 25%, mildly reduced RV function.  Low BP has limited GDMT titration.  His profound fatigue has been concerning for low output HF. RHC 3/25 showed low CI by thermo (2.05) but preserved by Fick.  Borderline  elevated PCWP, normal RA pressure.  There was also moderate mixed pulmonary arterial/pulmonary venous hypertension.  With his significant symptoms, he was started on milrinone  0.25 and added sildenafil  to try to lower PA pressure.  Milrinone  was later weaned off with medication adjustment, and co-ox remained excellent.  Repeat echo 3/25 showed EF 25-30%, mild RV dysfunction, moderate MR, PASP 62 mmHg.  Dr. Mitzie Anda discussed LVAD during last admission.  I think he is not quite there yet but we are going to need to follow him very closely. He was able to wean off milrinone  at last admission without much difficulty. Today, worse NYHA IIb-III symptoms, he is volume overloaded on exam, weight up 6 lbs, ReDs 44% - Increase torsemide  to 40 mg daily, add 20 KCL daily.  BMET/ BNP today, repeat  BMET in 10-14 days. - Continue Jardiance  10 mg daily. - Continue digoxin  0.125 mg daily. Check digoxin  level today. - off Toprol  XL with recent low output.  - Continue spironolactone  25 mg daily.    - He has seen EP and planning for ICD next month.   2. CAD:  Hx inferior STEMI 10/23 s/p PCI/DES to RCA. Anterior STEMI 12/10/22, cath showed occluded pLAD treated with PCI/DES, prior RCA stent patent, severe disease OM1 and distal LCx treated medically. No chest pain.  - Continue Plavix .   - No ASA given Eliquis  use.  - Continue rosuvastatin  40 mg + Zetia  10 mg daily. 3. Atrial fibrillation: Paroxysmal. Regular on exam today. - Continue Eliquis  5 mg bid. No bleeding issues. - Continue amiodarone  200 mg daily, amiodarone  labs are UTD. He will need a regular eye exam.  4. DM II: Per PCP - No change. 5. 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 Not a smoker, no known lung disease. CXR has shown some chronic interstitial changes. HRCT chest with bibasilar bronchiectasis that may be due to aspiration (though swallow study negative).  V/Q scan showed no chronic PE. Rheumatologic  serologies negative - Continue tadalafil  40 mg daily.   6. Pleural effusion: Moderate on right. S/p right thoracentesis 3/25. Pleural fluid was transudative by Light's criteria, no malignant cells. However, it grew E coli.  7. GI: Has had significant weight loss,  early satiety, GI discomfort.  CT abdomen/pelvis with gastric wall thickening. GI was consulted and EGD was performed, no definite evidence for malignancy, biopsies taken for H pylori testing and pathology => negative for H pylori and no gastric cancer. Heart failure may play a role in the symptoms. ? if dysgeusia related to his amiodarone . - Continue PPI - Continue GI follow up  Follow up in 3 weeks with APP for fluid check.   [facility] FNP-BC 05/22/23

## 2023-05-23 ENCOUNTER — Other Ambulatory Visit (HOSPITAL_COMMUNITY): Payer: Self-pay | Admitting: Family Medicine

## 2023-05-23 ENCOUNTER — Ambulatory Visit (HOSPITAL_COMMUNITY)
Admission: RE | Admit: 2023-05-23 | Discharge: 2023-05-23 | Disposition: A | Discharge status: Home / Self Care | Source: Ambulatory Visit | Attending: Family Medicine | Admitting: Family Medicine

## 2023-05-23 ENCOUNTER — Encounter (HOSPITAL_COMMUNITY): Payer: Self-pay

## 2023-05-23 ENCOUNTER — Ambulatory Visit (HOSPITAL_COMMUNITY): Payer: Self-pay | Admitting: Family Medicine

## 2023-05-23 ENCOUNTER — Telehealth: Payer: Self-pay

## 2023-05-23 ENCOUNTER — Other Ambulatory Visit (HOSPITAL_COMMUNITY): Payer: Self-pay

## 2023-05-23 VITALS — BP 90/60 | HR 79 | Wt 148.6 lb

## 2023-05-23 DIAGNOSIS — I2510 Atherosclerotic heart disease of native coronary artery without angina pectoris: Secondary | ICD-10-CM

## 2023-05-23 DIAGNOSIS — E785 Hyperlipidemia, unspecified: Secondary | ICD-10-CM | POA: Diagnosis not present

## 2023-05-23 DIAGNOSIS — R059 Cough, unspecified: Secondary | ICD-10-CM | POA: Insufficient documentation

## 2023-05-23 DIAGNOSIS — I255 Ischemic cardiomyopathy: Secondary | ICD-10-CM | POA: Diagnosis not present

## 2023-05-23 DIAGNOSIS — I480 Paroxysmal atrial fibrillation: Secondary | ICD-10-CM | POA: Insufficient documentation

## 2023-05-23 DIAGNOSIS — J90 Pleural effusion, not elsewhere classified: Secondary | ICD-10-CM | POA: Insufficient documentation

## 2023-05-23 DIAGNOSIS — Z79899 Other long term (current) drug therapy: Secondary | ICD-10-CM | POA: Diagnosis not present

## 2023-05-23 DIAGNOSIS — Z7984 Long term (current) use of oral hypoglycemic drugs: Secondary | ICD-10-CM | POA: Diagnosis not present

## 2023-05-23 DIAGNOSIS — I5022 Chronic systolic (congestive) heart failure: Secondary | ICD-10-CM | POA: Diagnosis not present

## 2023-05-23 DIAGNOSIS — I272 Pulmonary hypertension, unspecified: Secondary | ICD-10-CM | POA: Diagnosis not present

## 2023-05-23 DIAGNOSIS — E119 Type 2 diabetes mellitus without complications: Secondary | ICD-10-CM | POA: Insufficient documentation

## 2023-05-23 DIAGNOSIS — Z9889 Other specified postprocedural states: Secondary | ICD-10-CM | POA: Insufficient documentation

## 2023-05-23 DIAGNOSIS — R634 Abnormal weight loss: Secondary | ICD-10-CM | POA: Insufficient documentation

## 2023-05-23 DIAGNOSIS — Z7902 Long term (current) use of antithrombotics/antiplatelets: Secondary | ICD-10-CM | POA: Diagnosis not present

## 2023-05-23 DIAGNOSIS — Z955 Presence of coronary angioplasty implant and graft: Secondary | ICD-10-CM | POA: Diagnosis not present

## 2023-05-23 DIAGNOSIS — Z7901 Long term (current) use of anticoagulants: Secondary | ICD-10-CM | POA: Diagnosis not present

## 2023-05-23 DIAGNOSIS — I110 Hypertensive heart disease with heart failure: Secondary | ICD-10-CM | POA: Diagnosis not present

## 2023-05-23 DIAGNOSIS — R0689 Other abnormalities of breathing: Secondary | ICD-10-CM

## 2023-05-23 DIAGNOSIS — R6881 Early satiety: Secondary | ICD-10-CM | POA: Diagnosis not present

## 2023-05-23 DIAGNOSIS — I252 Old myocardial infarction: Secondary | ICD-10-CM | POA: Diagnosis not present

## 2023-05-23 LAB — BASIC METABOLIC PANEL WITH GFR
Anion gap: 9 (ref 5–15)
BUN: 13 mg/dL (ref 8–23)
CO2: 26 mmol/L (ref 22–32)
Calcium: 9.4 mg/dL (ref 8.9–10.3)
Chloride: 101 mmol/L (ref 98–111)
Creatinine, Ser: 1.4 mg/dL — ABNORMAL HIGH (ref 0.61–1.24)
GFR, Estimated: 55 mL/min — ABNORMAL LOW (ref >60)
Glucose, Bld: 140 mg/dL — ABNORMAL HIGH (ref 70–99)
Potassium: 3.7 mmol/L (ref 3.5–5.1)
Sodium: 136 mmol/L (ref 135–145)

## 2023-05-23 LAB — BRAIN NATRIURETIC PEPTIDE: B Natriuretic Peptide: 1046.4 pg/mL — ABNORMAL HIGH (ref 0.0–100.0)

## 2023-05-23 MED ORDER — AZITHROMYCIN 250 MG PO TABS
ORAL_TABLET | ORAL | 0 refills | Status: AC
  Filled 2023-05-23: qty 6, 5d supply, fill #0

## 2023-05-23 MED ORDER — TADALAFIL 20 MG PO TABS
20.0000 mg | ORAL_TABLET | Freq: Every day | ORAL | 3 refills | Status: DC
  Filled 2023-05-23 (×2): qty 90, 90d supply, fill #0

## 2023-05-23 MED ORDER — TORSEMIDE 20 MG PO TABS
40.0000 mg | ORAL_TABLET | Freq: Two times a day (BID) | ORAL | 3 refills | Status: DC
Start: 2023-05-23 — End: 2023-07-05
  Filled 2023-05-23 – 2023-06-02 (×4): qty 180, 45d supply, fill #0
  Filled 2023-06-02: qty 120, 30d supply, fill #0
  Filled 2023-06-16: qty 180, 45d supply, fill #0

## 2023-05-23 MED ORDER — POTASSIUM CHLORIDE CRYS ER 20 MEQ PO TBCR
40.0000 meq | EXTENDED_RELEASE_TABLET | Freq: Every day | ORAL | 3 refills | Status: DC
  Filled 2023-05-23: qty 90, 45d supply, fill #0
  Filled 2023-07-09: qty 90, 45d supply, fill #1
  Filled 2023-08-07: qty 90, 45d supply, fill #2

## 2023-05-23 MED ORDER — AMOXICILLIN-POT CLAVULANATE 875-125 MG PO TABS
1.0000 | ORAL_TABLET | Freq: Two times a day (BID) | ORAL | 0 refills | Status: DC
Start: 2023-05-23 — End: 2023-06-12
  Filled 2023-05-23: qty 10, 5d supply, fill #0

## 2023-05-23 NOTE — Telephone Encounter (Signed)
 Spoke with the patient and rescheduled him for 5/22.

## 2023-05-23 NOTE — Telephone Encounter (Addendum)
 Spoke  to patient and aware of keeping medications as prescribed and not to decrease doses.  ----- Message from Elmarie Hacking sent at 05/23/2023  1:06 PM EDT ----- Keep torsemide  at 60 mg bid and KCL at 40 daily, do not reduce dose after 3 days as previously discussed. I will repeat BMET at follow up in 2 weeks

## 2023-05-23 NOTE — Telephone Encounter (Signed)
-----   Message from Ardeen Kohler sent at 05/23/2023  5:13 PM EDT ----- Regarding: RE: mutual patient Thank you. If you are treating for pneumonia then I have to postpone ICD implant.   Josh ----- Message ----- From: Elmarie Hacking, FNP Sent: 05/23/2023  10:33 AM EDT To: Ardeen Kohler, MD Subject: mutual patient                                 Hi Dr. Daneil Dunker, I saw our mutual patient Mr Garrett Roberts, today in the AHF clinic. He is scheduled to have ICD implant with you on Friday. He looks puny today with lower BP and decreased breath sounds, and has some volume on board. I am  increasing his diuretics & got a CXR, which showed effusion vs PNA. I am starting him on abx for possible CAP, just wanted to make you aware in light of his up-coming procedure with you.  Thank you, Vernia Good, NP

## 2023-05-23 NOTE — Addendum Note (Signed)
 Addended by: Alfredo Inch on: 05/23/2023 02:01 PM   Modules accepted: Orders

## 2023-05-23 NOTE — Addendum Note (Signed)
 Encounter addended by: Dewey Fordyce, CMA on: 05/23/2023 9:44 AM  Actions taken: Diagnosis association updated, Order list changed, Flowsheet accepted, Clinical Note Signed, Charge Capture section accepted

## 2023-05-23 NOTE — Addendum Note (Signed)
 Encounter addended by: Elmarie Hacking, FNP on: 05/23/2023 12:49 PM  Actions taken: Clinical Note Signed

## 2023-05-23 NOTE — Addendum Note (Signed)
 Encounter addended by: Elmarie Hacking, FNP on: 05/23/2023 10:45 AM  Actions taken: Clinical Note Signed

## 2023-05-23 NOTE — Telephone Encounter (Addendum)
 Spoke to patient about lab results.aware of antibiotics sent to pharmacy. Aware of holding digoxin  on 06/13/23 lab appointment.Pt aware, agreeable, and verbalized understanding    ----- Message from Garrett Roberts sent at 05/23/2023 10:29 AM EDT ----- Small R pleural effusion, we increased diuretics today.  Also with possible PNA. With symptoms, will treat with: Augmentin 875 bid x 5 days and Zpack. Antibiotics send to pharmacy.   ##He will need a digoxin  trough drawn at follow up due to possible interaction with abx, discussed with PharmD ##  I will notify Dr. Daneil Dunker as he has ICD implant this week, will forward to his PCP as well. Will need to follow up with PCP in a couple weeks to make sure symptoms resolving.

## 2023-05-23 NOTE — Patient Instructions (Addendum)
 Good to see you today!  STOP Spironolactone   INCREASE Torsemide  to 40 mg Twice daily x 3 days only then back to previous dose  INCREASE Potassium to 40 meq daily x 3 days only then back to previous dose  Labs done today, your results will be available in MyChart, we will contact you for abnormal readings.  You are going to have chest xray today we will call you with any abnormal findings  Your physician recommends that you schedule a follow-up appointment: 2 weeks as scheduled  If you have any questions or concerns before your next appointment please send us  a message through [place] or call our office at [phone].    TO LEAVE A MESSAGE FOR THE NURSE SELECT OPTION 2, PLEASE LEAVE A MESSAGE INCLUDING: YOUR NAME DATE OF BIRTH CALL BACK NUMBER REASON FOR CALL**this is important as we prioritize the call backs  YOU WILL RECEIVE A CALL BACK THE SAME DAY AS LONG AS YOU CALL BEFORE 4:00 PM At the [facility], you and your health needs are our priority. As part of our continuing mission to provide you with exceptional heart care, we have created designated Provider Care Teams. These Care Teams include your primary Cardiologist (physician) and Advanced Practice Providers (APPs- Physician Assistants and Nurse Practitioners) who all work together to provide you with the care you need, when you need it.   You may see any of the following providers on your designated Care Team at your next follow up: Dr Jules Oar Dr Peder Bourdon Dr. Alwin Baars Dr. Arta Lark Amy Marijane Shoulders, NP Ruddy Corral, [place] [facility] [place], [place] Dennise Fitz, NP [place] Lee, NP Shawnee Dellen, NP Luster Salters, PharmD Bevely Brush, PharmD   Please be sure to bring in all your medications bottles to every appointment.    Thank you for choosing [facility]

## 2023-05-23 NOTE — Telephone Encounter (Signed)
-----   Message from [place] sent at 05/23/2023  1:06 PM EDT ----- Keep torsemide  at 60 mg bid and KCL at 40 daily, do not reduce dose after 3 days as previously discussed. I will repeat BMET at follow up in 2 weeks

## 2023-05-23 NOTE — Progress Notes (Signed)
 ReDS Vest / Clip - 05/23/23 0900       ReDS Vest / Clip   Station Marker C    Ruler Value 28    ReDS Value Range High volume overload    ReDS Actual Value 43

## 2023-05-24 ENCOUNTER — Encounter: Payer: Self-pay | Admitting: Emergency Medicine

## 2023-05-26 ENCOUNTER — Ambulatory Visit: Admitting: Physician Assistant

## 2023-05-26 ENCOUNTER — Telehealth (HOSPITAL_COMMUNITY): Payer: Self-pay

## 2023-05-26 NOTE — Telephone Encounter (Signed)
 Spoke with patient to discuss upcoming procedure.   Confirmed patient is scheduled for a Implantable cardioverter defibrilator (ICD) on Thursday, May 22 with Dr. Clinton Danas. Instructed patient to arrive at the Main Entrance A at [facility]: [address] [place], [place] [address] and check in at Admitting at 12:00 PM.   Labs: plan to obtain on 5/19.  Any recent signs of acute illness or been started on antibiotics?  Pt was recently diagnosed with a small right pleural effusion and treated for possible PNA with antibiotics. Dr Daneil Dunker is aware and OK with current procedure date for now. Patient reports swelling in feet, unchanged with positioning/elevation and cough present when lying on back. He denies any other associated symptoms.  Any medications to hold? Hold Eliquis  and Jardiance  for 3 days, hold Spironolactone  and Torsemide  morning of procedure. Medication instructions:  On the morning of your procedure you may take all other morning medications not discussed with a small sip of water. No eating or drinking after midnight prior to procedure.   The night before your procedure and the morning of your procedure, wash thoroughly with the CHG surgical soap from the neck down, paying special attention to the area where your procedure will be performed.  Advised of plan to go home the same day and will only stay overnight if medically necessary. You MUST have a responsible adult to drive you home and MUST be with you the first 24 hours after you arrive home.  Patient verbalized understanding to all instructions provided and agreed to proceed with procedure.

## 2023-05-30 LAB — BASIC METABOLIC PANEL WITH GFR
BUN/Creatinine Ratio: 10 (ref 10–24)
BUN: 14 mg/dL (ref 8–27)
CO2: 24 mmol/L (ref 20–29)
Calcium: 9.7 mg/dL (ref 8.6–10.2)
Chloride: 93 mmol/L — ABNORMAL LOW (ref 96–106)
Creatinine, Ser: 1.38 mg/dL — ABNORMAL HIGH (ref 0.76–1.27)
Glucose: 146 mg/dL — ABNORMAL HIGH (ref 70–99)
Potassium: 3.8 mmol/L (ref 3.5–5.2)
Sodium: 140 mmol/L (ref 134–144)
eGFR: 56 mL/min/{1.73_m2} — ABNORMAL LOW (ref >59)

## 2023-05-30 LAB — CBC WITH DIFFERENTIAL/PLATELET
Basophils Absolute: 0.1 10*3/uL (ref 0.0–0.2)
Basos: 1 %
EOS (ABSOLUTE): 0.2 10*3/uL (ref 0.0–0.4)
Eos: 4 %
Hematocrit: 42.4 % (ref 37.5–51.0)
Hemoglobin: 13.8 g/dL (ref 13.0–17.7)
Immature Grans (Abs): 0 10*3/uL (ref 0.0–0.1)
Immature Granulocytes: 0 %
Lymphocytes Absolute: 2.3 10*3/uL (ref 0.7–3.1)
Lymphs: 45 %
MCH: 29.2 pg (ref 26.6–33.0)
MCHC: 32.5 g/dL (ref 31.5–35.7)
MCV: 90 fL (ref 79–97)
Monocytes Absolute: 0.7 10*3/uL (ref 0.1–0.9)
Monocytes: 14 %
Neutrophils Absolute: 1.8 10*3/uL (ref 1.4–7.0)
Neutrophils: 36 %
Platelets: 236 10*3/uL (ref 150–450)
RBC: 4.72 x10E6/uL (ref 4.14–5.80)
RDW: 14.4 % (ref 11.6–15.4)
WBC: 5.1 10*3/uL (ref 3.4–10.8)

## 2023-05-31 ENCOUNTER — Ambulatory Visit: Payer: Self-pay | Admitting: Cardiology

## 2023-05-31 NOTE — Pre-Procedure Instructions (Signed)
 Instructed patient on the following items: Arrival time 1200 Nothing to eat or drink after midnight No meds AM of procedure Responsible person to drive you home and stay with you for 24 hrs Wash with special soap night before and morning of procedure If on anti-coagulant drug instructions Eliquis - last dose was 5/18

## 2023-06-01 ENCOUNTER — Other Ambulatory Visit (HOSPITAL_COMMUNITY): Payer: Self-pay

## 2023-06-01 ENCOUNTER — Ambulatory Visit (HOSPITAL_COMMUNITY)
Admission: RE | Disposition: A | Discharge status: Home / Self Care | Payer: Self-pay | Source: Home / Self Care | Attending: Cardiology

## 2023-06-01 ENCOUNTER — Other Ambulatory Visit: Payer: Self-pay

## 2023-06-01 ENCOUNTER — Encounter (HOSPITAL_COMMUNITY): Payer: Self-pay | Admitting: Cardiology

## 2023-06-01 ENCOUNTER — Ambulatory Visit (HOSPITAL_COMMUNITY)
Admission: RE | Admit: 2023-06-01 | Discharge: 2023-06-02 | Disposition: A | Discharge status: Home / Self Care | Attending: Cardiology | Admitting: Cardiology

## 2023-06-01 DIAGNOSIS — I5022 Chronic systolic (congestive) heart failure: Secondary | ICD-10-CM | POA: Diagnosis present

## 2023-06-01 DIAGNOSIS — N182 Chronic kidney disease, stage 2 (mild): Secondary | ICD-10-CM | POA: Insufficient documentation

## 2023-06-01 DIAGNOSIS — I480 Paroxysmal atrial fibrillation: Secondary | ICD-10-CM | POA: Insufficient documentation

## 2023-06-01 DIAGNOSIS — I2510 Atherosclerotic heart disease of native coronary artery without angina pectoris: Secondary | ICD-10-CM | POA: Diagnosis not present

## 2023-06-01 DIAGNOSIS — Z79899 Other long term (current) drug therapy: Secondary | ICD-10-CM | POA: Insufficient documentation

## 2023-06-01 DIAGNOSIS — E1122 Type 2 diabetes mellitus with diabetic chronic kidney disease: Secondary | ICD-10-CM | POA: Insufficient documentation

## 2023-06-01 DIAGNOSIS — I130 Hypertensive heart and chronic kidney disease with heart failure and stage 1 through stage 4 chronic kidney disease, or unspecified chronic kidney disease: Secondary | ICD-10-CM | POA: Insufficient documentation

## 2023-06-01 DIAGNOSIS — I252 Old myocardial infarction: Secondary | ICD-10-CM | POA: Insufficient documentation

## 2023-06-01 DIAGNOSIS — I255 Ischemic cardiomyopathy: Secondary | ICD-10-CM | POA: Diagnosis not present

## 2023-06-01 HISTORY — PX: ICD IMPLANT: EP1208

## 2023-06-01 LAB — GLUCOSE, CAPILLARY: Glucose-Capillary: 129 mg/dL — ABNORMAL HIGH (ref 70–99)

## 2023-06-01 MED ORDER — FENTANYL CITRATE (PF) 100 MCG/2ML IJ SOLN
INTRAMUSCULAR | Status: DC | PRN
  Administered 2023-06-01 (×2): 25 ug via INTRAVENOUS

## 2023-06-01 MED ORDER — MIDAZOLAM HCL 5 MG/5ML IJ SOLN
INTRAMUSCULAR | Status: DC | PRN
  Administered 2023-06-01 (×2): .5 mg via INTRAVENOUS

## 2023-06-01 MED ORDER — POTASSIUM CHLORIDE CRYS ER 20 MEQ PO TBCR
40.0000 meq | EXTENDED_RELEASE_TABLET | Freq: Every day | ORAL | Status: DC
  Administered 2023-06-02: 40 meq via ORAL
  Filled 2023-06-01: qty 2

## 2023-06-01 MED ORDER — PANTOPRAZOLE SODIUM 40 MG PO TBEC
40.0000 mg | DELAYED_RELEASE_TABLET | Freq: Two times a day (BID) | ORAL | Status: DC
  Administered 2023-06-02: 40 mg via ORAL
  Filled 2023-06-01: qty 1

## 2023-06-01 MED ORDER — EMPAGLIFLOZIN 10 MG PO TABS
10.0000 mg | ORAL_TABLET | Freq: Every day | ORAL | Status: DC
  Administered 2023-06-02: 10 mg via ORAL
  Filled 2023-06-01: qty 1

## 2023-06-01 MED ORDER — CEFAZOLIN SODIUM-DEXTROSE 2-4 GM/100ML-% IV SOLN
2.0000 g | INTRAVENOUS | Status: AC
  Administered 2023-06-01: 2 g via INTRAVENOUS

## 2023-06-01 MED ORDER — ONDANSETRON HCL 4 MG/2ML IJ SOLN: 4.0000 mg | Freq: Four times a day (QID) | INTRAMUSCULAR | Status: DC | PRN

## 2023-06-01 MED ORDER — ROSUVASTATIN CALCIUM 20 MG PO TABS
40.0000 mg | ORAL_TABLET | Freq: Every day | ORAL | Status: DC
  Administered 2023-06-01 – 2023-06-02 (×2): 40 mg via ORAL
  Filled 2023-06-01 (×2): qty 2

## 2023-06-01 MED ORDER — LIDOCAINE HCL (PF) 1 % IJ SOLN
INTRAMUSCULAR | Status: DC | PRN
  Administered 2023-06-01: 50 mL

## 2023-06-01 MED ORDER — CLOPIDOGREL BISULFATE 75 MG PO TABS
75.0000 mg | ORAL_TABLET | Freq: Every day | ORAL | Status: DC
  Administered 2023-06-02: 75 mg via ORAL
  Filled 2023-06-01: qty 1

## 2023-06-01 MED ORDER — CEFAZOLIN SODIUM-DEXTROSE 2-4 GM/100ML-% IV SOLN
INTRAVENOUS | Status: AC
  Filled 2023-06-01: qty 100

## 2023-06-01 MED ORDER — MIRTAZAPINE 15 MG PO TABS
15.0000 mg | ORAL_TABLET | Freq: Every day | ORAL | Status: DC
Start: 2023-06-01 — End: 2023-06-02
  Administered 2023-06-01: 15 mg via ORAL
  Filled 2023-06-01: qty 1

## 2023-06-01 MED ORDER — SODIUM CHLORIDE 0.9 % IV SOLN
80.0000 mg | INTRAVENOUS | Status: AC
  Administered 2023-06-01: 80 mg

## 2023-06-01 MED ORDER — LIDOCAINE HCL (PF) 1 % IJ SOLN
INTRAMUSCULAR | Status: AC
  Filled 2023-06-01: qty 60

## 2023-06-01 MED ORDER — SODIUM CHLORIDE 0.9 % IV SOLN: INTRAVENOUS | Status: DC

## 2023-06-01 MED ORDER — CHLORHEXIDINE GLUCONATE 4 % EX SOLN
4.0000 | Freq: Once | CUTANEOUS | Status: DC
  Filled 2023-06-01: qty 60

## 2023-06-01 MED ORDER — TORSEMIDE 20 MG PO TABS
40.0000 mg | ORAL_TABLET | Freq: Two times a day (BID) | ORAL | Status: DC
  Administered 2023-06-02: 40 mg via ORAL
  Filled 2023-06-01: qty 2

## 2023-06-01 MED ORDER — AMIODARONE HCL 200 MG PO TABS
200.0000 mg | ORAL_TABLET | Freq: Every day | ORAL | Status: DC
  Administered 2023-06-02: 200 mg via ORAL
  Filled 2023-06-01: qty 1

## 2023-06-01 MED ORDER — FENTANYL CITRATE (PF) 100 MCG/2ML IJ SOLN
INTRAMUSCULAR | Status: AC
  Filled 2023-06-01: qty 2

## 2023-06-01 MED ORDER — DIGOXIN 125 MCG PO TABS
0.1250 mg | ORAL_TABLET | Freq: Every day | ORAL | Status: DC
  Administered 2023-06-01 – 2023-06-02 (×2): 0.125 mg via ORAL
  Filled 2023-06-01 (×2): qty 1

## 2023-06-01 MED ORDER — ONDANSETRON 4 MG PO TBDP: 4.0000 mg | ORAL_TABLET | Freq: Three times a day (TID) | ORAL | Status: DC | PRN

## 2023-06-01 MED ORDER — GENTAMICIN SULFATE 40 MG/ML IJ SOLN
INTRAMUSCULAR | Status: AC
  Filled 2023-06-01: qty 2

## 2023-06-01 MED ORDER — LEVOTHYROXINE SODIUM 25 MCG PO TABS
25.0000 ug | ORAL_TABLET | Freq: Every day | ORAL | Status: DC
  Administered 2023-06-02: 25 ug via ORAL
  Filled 2023-06-01: qty 1

## 2023-06-01 MED ORDER — POVIDONE-IODINE 10 % EX SWAB
2.0000 | Freq: Once | CUTANEOUS | Status: AC
  Administered 2023-06-01: 2 via TOPICAL

## 2023-06-01 MED ORDER — ACETAMINOPHEN 325 MG PO TABS
325.0000 mg | ORAL_TABLET | ORAL | Status: DC | PRN
  Administered 2023-06-01: 650 mg via ORAL
  Administered 2023-06-02: 325 mg via ORAL
  Filled 2023-06-01 (×2): qty 2

## 2023-06-01 MED ORDER — EZETIMIBE 10 MG PO TABS
10.0000 mg | ORAL_TABLET | Freq: Every day | ORAL | Status: DC
  Administered 2023-06-02: 10 mg via ORAL
  Filled 2023-06-01: qty 1

## 2023-06-01 MED ORDER — MIDAZOLAM HCL 2 MG/2ML IJ SOLN
INTRAMUSCULAR | Status: AC
  Filled 2023-06-01: qty 2

## 2023-06-01 SURGICAL SUPPLY — 7 items
CABLE SURGICAL S-101-97-12 (CABLE) ×1 IMPLANT
ICD ACTICOR DX (ICD Generator) IMPLANT
LEAD PAMIRA DX 65/15 (Lead) IMPLANT
PAD DEFIB RADIO PHYSIO CONN (PAD) ×1 IMPLANT
SHEATH 9FR PRELUDE SNAP 13 (SHEATH) IMPLANT
SHEATH PROBE COVER 6X72 (BAG) IMPLANT
TRAY PACEMAKER INSERTION (PACKS) ×1 IMPLANT

## 2023-06-01 NOTE — Progress Notes (Signed)
 Report given to 6E01 RN. Pt had uncomplicated recovery. No issues or complaints. Alert and oriented and will transfer to 6E via stretcher. Pt has been compliant with arm sling throughout recovery.

## 2023-06-01 NOTE — Plan of Care (Signed)
  Problem: Activity: Goal: Risk for activity intolerance will decrease Outcome: Progressing   Problem: Pain Managment: Goal: General experience of comfort will improve and/or be controlled Outcome: Progressing   Problem: Safety: Goal: Ability to remain free from injury will improve Outcome: Progressing   Problem: Skin Integrity: Goal: Risk for impaired skin integrity will decrease Outcome: Progressing

## 2023-06-01 NOTE — Progress Notes (Signed)
 Pt arrived to 6e01 via stretcher from cath lab. Pt ambulated with assistance from stretcher to bed. Pt denies pain at this time. Received report from Raylene Calamity, RN. See assessment.

## 2023-06-01 NOTE — H&P (Signed)
 Electrophysiology Note:   Date:  5/22/5  ID:  Garrett Roberts, DOB [birth year], MRN [id number]   Primary Cardiologist: Cody Das, MD Primary Heart Failure: None Electrophysiologist: Ardeen Kohler, MD       History of Present Illness:   Garrett Roberts is a 68 y.o. male with h/o PMH of CAD s/p inferior STEMI 10/2021 treated with DES to RCA and recent anterior STEMI on 12/10/2022 s/p DES to LAD, chronic systolic heart failure, paroxysmal AF, DM type II, ICM, CKD stage II, HTN, HLD who is being seen today for follow up. Patient presented to ED on 12/10/22 with chest pain. He was found to have anterior STEMI. He underwent emergent LHC with PCI to prox LAD. Echo was done during that hospitalization and showed newly reduced LVEF of 25%. Course was complicated by AF with RVR. He was started on amiodarone  and underwent TEE/DCCV with conversion to sinus rhythm. He was discharged on oral amiodarone  and Eiquis on 12/15/22.   Discussed the use of AI scribe software for clinical note transcription with the patient, who gave verbal consent to proceed. History of Present Illness Patient was recently seen by Dr. Mitzie Anda on 3/6 where he complained of profound fatigue and poor appetite, along with two-pillow orthopnea.  There was concern for low output heart failure, so he was set up for a outpatient right heart cath.  He underwent right heart catheterization on 3/10 which demonstrated a cardiac index of 2.05.  He was directly admitted following his heart catheterization and started on milrinone  and sildenafil  was also added.  He underwent Swan guided optimization.  He was able to be weaned off milrinone  and discharged.  GDMT has been limited by hypotension.  Patient presents today in clinic for follow-up conversation regarding ICD therapy.  He recently saw Dr. Alyne Jules as a posthospital discharge appointment and he recommended ICD.  Patient has no new or acute complaints today.  He reports that he has  been relatively stable, although does mention some lower blood pressures at home, often with systolics in the 90s but occasionally in the 80s. He is on spironolactone  and torsemide , taking torsemide  every other day to manage blood pressure and hydration status. No trouble breathing or swelling in the legs.   Interval: Patient presents today for scheduled defibrillator implant. Reports feeling relatively well. No new or acute complaints.     Review of systems complete and found to be negative unless listed in HPI.    EP Information / Studies Reviewed:     EKG is not ordered today. EKG from 03/20/23 reviewed which showed sinus rhythm with PR and QRS 96ms.        Echo 02/17/23:   1. No LV thrombus noted. Left ventricular ejection fraction, by  estimation, is 25%. The left ventricle has severely decreased function.  The left ventricle demonstrates global hypokinesis. The left ventricular  internal cavity size was mildly dilated.  Left ventricular diastolic parameters are consistent with Grade III  diastolic dysfunction (restrictive). Elevated left atrial pressure.   2. Right ventricular systolic function is mildly reduced. The right  ventricular size is mildly enlarged. There is moderately elevated  pulmonary artery systolic pressure. The estimated right ventricular  systolic pressure is 59.9 mmHg.   3. Left atrial size was moderately dilated.   4. Right atrial size was mildly dilated.   5. The mitral valve is abnormal. Moderate functional mitral valve  regurgitation. No evidence of mitral stenosis.   6. The aortic valve is tricuspid. Aortic  valve regurgitation is not  visualized. No aortic stenosis is present.   7. Pleural effusion noted. A small pericardial effusion is present. The  pericardial effusion is localized near the right ventricle.   8. The inferior vena cava is normal in size with greater than 50%  respiratory variability, suggesting right atrial pressure of 3 mmHg.     Zio patch monitor 14 days 01/25/2023 - 02/08/2023: Dominant rhythm: Sinus. HR 59-91 bpm. Avg HR 69 bpm, in sinus rhythm. 2 episodes of SVT/atrial tachycardia, fastest and longest at 118 bpm for 15 beats. <1% isolated SVE, couplet/triplets. 0 episodes of VT. <1% isolated VE, couplets. No atrial fibrillation/atrial flutter/VT/high grade AV block, sinus pause >3sec noted. 3 patient triggered events, correlated with SVE/VE.     Risk Assessment/Calculations:     CHA2DS2-VASc Score = 5   This indicates a 7.2% annual risk of stroke. The patient's score is based upon: CHF History: 1 HTN History: 1 Diabetes History: 1 Stroke History: 0 Vascular Disease History: 1 Age Score: 1 Gender Score: 0               Physical Exam:    Today's Vitals   06/01/23 1231 06/01/23 1239  BP: 104/75   Pulse: 69   Resp: 17   Temp: 97.6 F (36.4 C)   TempSrc: Oral   SpO2: 97%   Weight: 65.3 kg   Height: 6' (1.829 m)   PainSc:  0-No pain   Body mass index is 19.53 kg/m.  GEN: Well developed, in no acute distress NECK: No JVD CARDIAC: Normal rate, regular rhythm.  RESPIRATORY:  Clear to auscultation without rales, wheezing or rhonchi  ABDOMEN: Soft, non-distended EXTREMITIES:  No edema; No deformity    ASSESSMENT AND PLAN:     #Chronic systolic heart failure: EF 25%. NYHA II-III symptoms. Well compensated on exam today. His symptoms of loss of appetite, weight loss, shortness of breath are concerning for a chronically low output state.  #Ischemic cardiomyopathy: #CAD s/p RCA STEMI in 2023 and LAD STEMI in 2024: - EF remains <35% despite optimal medical therapy and >90 days post revasc.  Guideline directed medical therapy has been limited by hypotension.  He meets criteria for primary prevention ICD. Explained risks, benefits, and alternatives to ICD implantation, including but not limited to bleeding, infection, damage to heart or lungs, heart attack, stroke, or death.  Pt verbalized  understanding and would like to proceed today.    #Symptomatic paroxysmal atrial fibrillation: Given his degree of LV dysfunction and hypotension with most recent episode, I think rhythm control strategy would be best at this time. He appears to be maintaining sinus with amiodarone .  #High risk medication use: LFTs normal on 03/07/23. TSH elevated in February 2025 with normal T4, and no symptoms of hypothyroidism.  Normal LFTs in March 2025. - Continue amiodarone  for now. Currently not a candidate for catheter ablation. Tikosyn would be an option to get off amiodarone  in the long-term, but may provide less protection for ventricular arrhthymias in the setting of large ischemic scar burden. - Tentatively resume Eliquis  in 72 hours.  - Monitor AF burden with VDD ICD.    Signed, Ardeen Kohler, MD

## 2023-06-02 ENCOUNTER — Other Ambulatory Visit: Payer: Self-pay

## 2023-06-02 ENCOUNTER — Other Ambulatory Visit: Payer: Self-pay | Admitting: General Practice

## 2023-06-02 ENCOUNTER — Encounter (HOSPITAL_COMMUNITY): Payer: Self-pay | Admitting: Cardiology

## 2023-06-02 ENCOUNTER — Ambulatory Visit (HOSPITAL_COMMUNITY)

## 2023-06-02 ENCOUNTER — Other Ambulatory Visit (HOSPITAL_COMMUNITY): Payer: Self-pay

## 2023-06-02 DIAGNOSIS — N182 Chronic kidney disease, stage 2 (mild): Secondary | ICD-10-CM | POA: Diagnosis not present

## 2023-06-02 DIAGNOSIS — I5022 Chronic systolic (congestive) heart failure: Secondary | ICD-10-CM

## 2023-06-02 DIAGNOSIS — I130 Hypertensive heart and chronic kidney disease with heart failure and stage 1 through stage 4 chronic kidney disease, or unspecified chronic kidney disease: Secondary | ICD-10-CM | POA: Diagnosis not present

## 2023-06-02 DIAGNOSIS — E038 Other specified hypothyroidism: Secondary | ICD-10-CM

## 2023-06-02 DIAGNOSIS — E1122 Type 2 diabetes mellitus with diabetic chronic kidney disease: Secondary | ICD-10-CM | POA: Diagnosis not present

## 2023-06-02 MED ORDER — ACETAMINOPHEN 325 MG PO TABS: 325.0000 mg | ORAL_TABLET | ORAL | Status: AC | PRN

## 2023-06-02 MED ORDER — OXYCODONE-ACETAMINOPHEN 5-325 MG PO TABS
1.0000 | ORAL_TABLET | Freq: Once | ORAL | Status: AC
  Administered 2023-06-02: 1 via ORAL
  Filled 2023-06-02: qty 1

## 2023-06-02 MED FILL — Midazolam HCl Inj 2 MG/2ML (Base Equivalent): INTRAMUSCULAR | Qty: 1 | Status: AC

## 2023-06-02 NOTE — Discharge Instructions (Addendum)
 After Your ICD (Implantable Cardiac Defibrillator)   You have a Biotronik ICD  ACTIVITY Do not lift your arm above shoulder height for 1 week after your procedure. After 7 days, you may progress as below.  You should remove your sling 24 hours after your procedure, unless otherwise instructed by your provider.     Friday Jun 09, 2023  Saturday Jun 10, 2023 Sunday June 11, 2023 Monday June 12, 2023   Do not lift, push, pull, or carry anything over 10 pounds with the affected arm until 6 weeks (Friday July 14, 2023 ) after your procedure.   You may drive AFTER your wound check, unless you have been told otherwise by your provider.   Ask your healthcare provider when you can go back to work   INCISION/Dressing CONTINUE your Plavix  Hold your Eliquis  until 5/27 evening  If large square, outer bandage is left in place, this can be removed after 24 hours from your procedure. Do not remove steri-strips or glue as below.   Monitor your defibrillator site for redness, swelling, and drainage. Call the device clinic at [phone] if you experience these symptoms or fever/chills.  If your incision is sealed with Steri-strips or staples, you may shower 7 days after your procedure or when told by your provider. Do not remove the steri-strips or let the shower hit directly on your site. You may wash around your site with soap and water.    If you were discharged in a sling, please do not wear this during the day more than 48 hours after your surgery unless otherwise instructed. This may increase the risk of stiffness and soreness in your shoulder.   Avoid lotions, ointments, or perfumes over your incision until it is well-healed.  You may use a hot tub or a pool AFTER your wound check appointment if the incision is completely closed.  Your ICD is designed to protect you from life threatening heart rhythms. Because of this, you may receive a shock.   1 shock with no symptoms:  Call the office  during business hours. 1 shock with symptoms (chest pain, chest pressure, dizziness, lightheadedness, shortness of breath, overall feeling unwell):  Call 911. If you experience 2 or more shocks in 24 hours:  Call 911. If you receive a shock, you should not drive for 6 months per the [place] DMV IF you receive appropriate therapy from your ICD.   ICD Alerts:  Some alerts are vibratory and others beep. These are NOT emergencies. Please call our office to let us  know. If this occurs at night or on weekends, it can wait until the next business day. Send a remote transmission.  If your device is capable of reading fluid status (for heart failure), you will be offered monthly monitoring to review this with you.   DEVICE MANAGEMENT Remote monitoring is used to monitor your ICD from home. This monitoring is scheduled every 91 days by our office. It allows us  to keep an eye on the functioning of your device to ensure it is working properly. You will routinely see your Electrophysiologist annually (more often if necessary).   You should receive your ID card for your new device in 4-8 weeks. Keep this card with you at all times once received. Consider wearing a medical alert bracelet or necklace.  Your ICD  may be MRI compatible. This will be discussed at your next office visit/wound check.  You should avoid contact with strong electric or magnetic fields.   Do  not use amateur (ham) radio equipment or electric (arc) Optician, dispensing. MP3 player headphones with magnets should not be used. Some devices are safe to use if held at least 12 inches (30 cm) from your defibrillator. These include power tools, lawn mowers, and speakers. If you are unsure if something is safe to use, ask your health care provider.  When using your cell phone, hold it to the ear that is on the opposite side from the defibrillator. Do not leave your cell phone in a pocket over the defibrillator.  You may safely use electric blankets, heating  pads, computers, and microwave ovens.  Call the office right away if: You have chest pain. You feel more than one shock. You feel more short of breath than you have felt before. You feel more light-headed than you have felt before. Your incision starts to open up.  This information is not intended to replace advice given to you by your health care provider. Make sure you discuss any questions you have with your health care provider.

## 2023-06-02 NOTE — Discharge Summary (Addendum)
 ELECTROPHYSIOLOGY PROCEDURE DISCHARGE SUMMARY    Patient ID: Garrett Roberts,  MRN: [id number], DOB/AGE: 68/24/1957 68 y.o.  Admit date: 06/01/2023 Discharge date: 06/02/2023  Primary Care Physician: Jolanda Nation, NP  Primary Cardiologist: Cody Das, MD  Electrophysiologist: Dr. Daneil Dunker    Primary Diagnosis:  Chronic systolic CHF   Allergies  Allergen Reactions   Beef-Derived Drug Products Other (See Comments)    Religious observance   Pork-Derived Products Other (See Comments)    Religious observance     Procedures This Admission:  1.  Implantation of a Biotronik single chamber ICD on 06/01/2023 by Dr. Daneil Dunker.  The patient received a Biotronik Acticor [phone] (single)  with Biotronik Pamira DX [phone]  right ventricular lead. Aaron Aas DFTs were deferred at time of implant There were no post procedure complications 2.  CXR on 06/02/23 demonstrated no pneumothorax status post device implantation.      Brief HPI: Garrett Roberts is a 68 y.o. male was referred to electrophysiology in the outpatient setting  for consideration of ICD implantation.  Past medical history includes above.  The patient has persistent LV dysfunction despite guideline directed therapy.  Risks, benefits, and alternatives to ICD implantation were reviewed with the patient who wished to proceed.   Hospital Course:  The patient was admitted and underwent implantation of a Biotronik single chamber ICD with details as outlined above. They were monitored on telemetry overnight which demonstrated NSR .  Left chest was without hematoma or ecchymosis.  The device was interrogated and found to be functioning normally.  CXR was obtained and demonstrated no pneumothorax status post device implantation..  Wound care, arm mobility, and restrictions were reviewed with the patient.  The patient was examined and considered stable for discharge to home.   The patient is not currently on ACE-I/ARB/ARNI due to  hypotension or BB due to hypotension and recent low output.   Anticoagulation resumption Pt to continue Plavix  uninterrupted.  Resume Eliquis  Monday, 5/26 with the evening dose.   Physical Exam: Vitals:   06/01/23 2020 06/01/23 2313 06/02/23 0336 06/02/23 0725  BP: (!) 88/64 (!) 87/65 (!) 91/57 98/68  Pulse: 72 70 69 68  Resp: 18 16 18 18   Temp: 97.9 F (36.6 C) 98.5 F (36.9 C) 98.6 F (37 C) 98.8 F (37.1 C)  TempSrc: Oral Oral Oral Oral  SpO2: 100% 100% 95% 98%  Weight:      Height:        GEN- NAD. A&O x 3.  HEENT: Normocephalic, atraumatic Lungs- CTAB, normal effort.  Heart- RRR. No M/G/R.  GI- Soft, NT, ND.  Extremities- No clubbing, cyanosis, or edema Skin- Warm and dry, no rash or lesion. ICD site stable  Discharge Medications:  Allergies as of 06/02/2023       Reactions   Beef-derived Drug Products Other (See Comments)   Religious observance   Pork-derived Products Other (See Comments)   Religious observance        Medication List     PAUSE taking these medications    apixaban  5 MG Tabs tablet Wait to take this until: Jun 06, 2023 Evening Commonly known as: ELIQUIS  Take 1 tablet (5 mg total) by mouth 2 (two) times daily.       TAKE these medications    acetaminophen  325 MG tablet Commonly known as: TYLENOL  Take 1-2 tablets (325-650 mg total) by mouth every 4 (four) hours as needed for mild pain (pain score 1-3).   amiodarone  200 MG tablet Commonly  known as: PACERONE  Take 1 tablet (200 mg total) by mouth daily.   amoxicillin -clavulanate 875-125 MG tablet Commonly known as: AUGMENTIN  Take 1 tablet by mouth 2 (two) times daily.   B-12 PO Take 1 tablet by mouth daily with lunch.   clopidogrel  75 MG tablet Commonly known as: PLAVIX  Take 1 tablet (75 mg total) by mouth daily.   digoxin  0.125 MG tablet Commonly known as: LANOXIN  Take 1 tablet (0.125 mg total) by mouth daily.   empagliflozin  10 MG Tabs tablet Commonly known as:  Jardiance  Take 1 tablet (10 mg total) by mouth daily before breakfast.   ezetimibe  10 MG tablet Commonly known as: ZETIA  Take 1 tablet (10 mg total) by mouth daily.   levothyroxine  25 MCG tablet Commonly known as: SYNTHROID  Take 1 tablet (25 mcg total) by mouth daily.   MAGNESIUM PO Take 1 tablet by mouth at bedtime.   mirtazapine  15 MG tablet Commonly known as: Remeron  Take 1 tablet (15 mg total) by mouth at bedtime.   ondansetron  4 MG disintegrating tablet Commonly known as: ZOFRAN -ODT Take 1 tablet (4 mg total) by mouth every 8 (eight) hours as needed.   pantoprazole  40 MG tablet Commonly known as: PROTONIX  Take 1 tablet (40 mg total) by mouth 2 (two) times daily before a meal.   potassium chloride  SA 20 MEQ tablet Commonly known as: KLOR-CON  M Take 2 tablets (40 mEq total) by mouth daily.   rosuvastatin  40 MG tablet Commonly known as: CRESTOR  Take 40 mg by mouth daily.   tadalafil  20 MG tablet Commonly known as: CIALIS  Take 1 tablet (20 mg total) by mouth daily.   torsemide  20 MG tablet Commonly known as: DEMADEX  Take 2 tablets (40 mg total) by mouth 2 (two) times daily.   VITAMIN C PO Take 1 tablet by mouth daily with lunch.   VITAMIN K2-VITAMIN D3 PO Take 1 tablet by mouth daily with lunch.   zinc gluconate 50 MG tablet Take 50 mg by mouth daily with lunch.        Disposition: Home with usual follow up as in AVS  Signed, Tylene Galla, PA-C  06/02/2023 7:32 AM  I have seen, examined the patient, and reviewed the above assessment and plan.    Hospital Course: Patient with a history of chronic systolic heart failure secondary to ischemic cardiomyopathy presented for primary prevention ICD implant on 06/01/2023.  He was kept overnight for routine monitoring.  There were no acute complications.  This morning, patient reported feeling relatively well with no new or acute complaints.  General: Well developed, in no acute distress.  Neck: No  JVD.  Cardiac: Normal rate, regular rhythm.  Left chest ICD pocket without hematoma or bleeding. Resp: Normal work of breathing.  Ext: No edema.  Neuro: No gross focal deficits.  Psych: Normal affect.   Plan:  -Chest x-ray with appropriate lead position and no pneumothorax. -In person device check was performed with appropriate device function and stable lead parameters. -Usual post implant discharge teaching was provided regarding activity restrictions and wound care. -Patient will continue Plavix .  He will resume Eliquis  in 72 hours.  Duration of Discharge Encounter: 45 minutes  Ardeen Kohler, MD 06/02/2023 11:03 PM

## 2023-06-02 NOTE — Telephone Encounter (Signed)
 Lvmtcb, sent mychart message

## 2023-06-02 NOTE — Telephone Encounter (Signed)
 Patient was supposed to follow up with [facility] the beginning of the month. Please call patient to schedule appt.

## 2023-06-06 ENCOUNTER — Other Ambulatory Visit (HOSPITAL_COMMUNITY): Payer: Self-pay

## 2023-06-06 MED ORDER — LEVOTHYROXINE SODIUM 25 MCG PO TABS
25.0000 ug | ORAL_TABLET | Freq: Every day | ORAL | 1 refills | Status: DC
  Filled 2023-06-06: qty 60, 60d supply, fill #0

## 2023-06-06 NOTE — Telephone Encounter (Signed)
 Called  pt and schedule a appt for f/u

## 2023-06-06 NOTE — Telephone Encounter (Signed)
 Last TSH done in march; okay to stay on this dose?

## 2023-06-08 ENCOUNTER — Ambulatory Visit

## 2023-06-10 ENCOUNTER — Telehealth: Payer: Self-pay | Admitting: Cardiology

## 2023-06-10 NOTE — Telephone Encounter (Signed)
 Patient's family member called in regarding ICD site. Reports small amount of swelling at the incision site where steri-strips are located with a faint reddish-purple line. Mild increase in pain today, no oozing or drainage noted. No fevers. He has been on plavix  and resumed eliquis  5/27. Advised to hold the eliquis  evening dose and monitor site. If continues to swelling, develop drainage would need to be seen, otherwise monitor this evening. Instructed to callback with site update tomorrow, and potential resume eliquis . Will route to the office for wound check on Monday if possible

## 2023-06-12 ENCOUNTER — Telehealth (HOSPITAL_COMMUNITY): Payer: Self-pay

## 2023-06-12 ENCOUNTER — Other Ambulatory Visit (HOSPITAL_COMMUNITY): Payer: Self-pay

## 2023-06-12 ENCOUNTER — Encounter: Attending: Internal Medicine

## 2023-06-12 ENCOUNTER — Encounter: Payer: Self-pay | Admitting: General Practice

## 2023-06-12 ENCOUNTER — Ambulatory Visit (INDEPENDENT_AMBULATORY_CARE_PROVIDER_SITE_OTHER): Admitting: General Practice

## 2023-06-12 VITALS — BP 112/60 | HR 75 | Temp 97.8°F | Ht 72.0 in | Wt 145.0 lb

## 2023-06-12 DIAGNOSIS — E038 Other specified hypothyroidism: Secondary | ICD-10-CM | POA: Diagnosis not present

## 2023-06-12 DIAGNOSIS — Z95811 Presence of heart assist device: Secondary | ICD-10-CM | POA: Diagnosis not present

## 2023-06-12 DIAGNOSIS — I10 Essential (primary) hypertension: Secondary | ICD-10-CM

## 2023-06-12 DIAGNOSIS — Z7984 Long term (current) use of oral hypoglycemic drugs: Secondary | ICD-10-CM | POA: Diagnosis not present

## 2023-06-12 DIAGNOSIS — E118 Type 2 diabetes mellitus with unspecified complications: Secondary | ICD-10-CM | POA: Diagnosis not present

## 2023-06-12 DIAGNOSIS — E7800 Pure hypercholesterolemia, unspecified: Secondary | ICD-10-CM

## 2023-06-12 DIAGNOSIS — I255 Ischemic cardiomyopathy: Secondary | ICD-10-CM | POA: Insufficient documentation

## 2023-06-12 LAB — POCT GLYCOSYLATED HEMOGLOBIN (HGB A1C): Hemoglobin A1C: 7 % — AB (ref 4.0–5.6)

## 2023-06-12 MED ORDER — LEVOTHYROXINE SODIUM 75 MCG PO TABS
75.0000 ug | ORAL_TABLET | Freq: Every day | ORAL | 0 refills | Status: DC
  Filled 2023-06-12: qty 90, 90d supply, fill #0

## 2023-06-12 NOTE — Assessment & Plan Note (Signed)
 controlled.  Continue rosuvastatin  40 mg once daily.  Lipid panel at next visit.

## 2023-06-12 NOTE — Assessment & Plan Note (Signed)
 Increase levothyroxine  75 mcg once daily.  Scheduled to see endocrinology in August.  TSH level pending.

## 2023-06-12 NOTE — Progress Notes (Addendum)
 [facility] Note  PCP: Jolanda Nation, NP EP: Dr. Daneil Dunker HF Cardiology: Dr. Mitzie Anda  68 y.o. with history of CAD, DM II, HTN, HLD, ischemic cardiomyopathy/chronic systolic CHF.   Patient had inferior STEMI 10/23 s/p PCI/DES to RCA. Had residual diffuse disease in diagonals and OMs. EF 40-45% at time of cath. Subsequent echo in 10/23 with EF 55-60%.   He was not taking medications in 2024 after losing insurance. Presented with anterior STEMI 12/10/22. Cardiac cath with 100% proximal LAD treated with PCI/DES. Prior RCA stent patent, severe disease in tortuous ramus and distal LCx treated medically.  Echo with EF 25%, no LV thrombus, AK mid septum to apex, moderately reduced RV systolic function. Course complicated by atrial fibrillation with RVR. Started on IV amiodarone  and underwent DCCV to SR.  GDMT limited by soft blood pressure. Also seen by GI for abdominal pain. Had thickening of stomach on CT scan. H. Pylori later resulted positive after discharge, seen by GI and started on treatment for H pylori-related gastritis.   Returned to ED 12/19/22 with recurrent atrial fibrillation. He converted to SR while in waiting room and was discharged home.  Admitted 2/25 with acute CHF. Diuresed with IV lasix . GDMT cut back 2/2 to low BP, Entresto  and spironolactone  stopped. He was discharged home, weight 154 lbs.  Echo in 2/25 showed EF 25% with mild RV dysfunction and mild RV enlargement, PASP 60 mmHg, moderate MR.   He was seen again in the ER 03/14/23 with worsening fatigue and dyspnea.  Torsemide  was increased to 40 qam/20 qpm.   Patient was set up for RHC in 3/25 due to ongoing severe fatigue. This showed low CI 2.05 by thermodilution as well as moderate pulmonary arterial hypertension, he was admitted and started on milrinone  gtt.  He was gradually titrated off milrinone  and maintained a good co-ox off milrinone .  He had a moderate right pleural effusion, underwent right  thoracentesis.  Fluid was transudative but grew E coli, he was treated with Bactrim  based on sensitivities.  Given early satiety and poor appetite, he had an EGD that showed gastritic, biopsies showed no H pylori or gastric cancer.  HRCT chest showed bibasilar bronchiectasis, ?aspiration. However, swallow study was not suggestive of aspiration. V/Q scan was done due to pulmonary hypertension, this showed no chronic PE.  He was started on sildenafil  and meds were titrated, he felt better when he went home.   Echo 3/25 showed EF 25-30%, mild RV dysfunction, moderate MR, PASP 62 mmHg.   S/p Biotronik ICD 5/25.  Today he returns for HF follow up. Overall feeling fine. Breathing "OK", SOB walking up steps and occasional orthopnea. He attributes some of his dyspnea to physical deconditioning. Feet are swelling. Denies palpitations, abnormal bleeding, CP, dizziness, or PND. Appetite fair but improving. Weight at home 142 pounds. Taking all medications.   ECG (personally reviewed): NSR 76 bpm, QTc 463    Labs (12/24): hgb 15.1, BNP 350, K 4.4, creatinine 1.24 Labs (2/25): K 4.1, creatinine 1.48, BNP 481, LFTs normal, free T3/T4 normal Labs (3/25): K 3.5, creatinine 1.44 => 1.27, hgb 13.4, BNP 812, HS-TnI 44 => 39 Labs (5/25): K 3.8, creatinine 1.38  SH: No ETOH or tobacco use.  Lives at home in [place] with his wife and children. Originally from [place].   PMH: 1. Chronic systolic CHF: Ischemic cardiomyopathy.   - Echo (12/24): EF 20-25%, LAD territory WMAs, moderate LVH, moderately decreased RV systolic function, mild MR.  -  Echo (2/25): EF 25% with mild RV dysfunction and mild RV enlargement, PASP 60 mmHg, moderate MR.  - RHC (3/25): mean RA 2, PA 60/18 mean 31, mean PCWP 17, CI 2.02 thermo, CI 2.52 Fick, PAPi 21, PVR 3.7 WU.  - Echo (3/25): EF 25-30%, mild RV dysfunction, moderate MR, PASP 62 mmHg.  2. CAD: Inferior STEMI 10/23 with DES to RCA.  - Anterior STEMI (11/24): Occluded proximal LAD  treated with DES, occluded D2, 90% OM1, 95% distal LCx.  Distal LCx and OM1 treated medically.  3. Atrial fibrillation: Paroxysmal.  - DCCV in 12/24.  4. H pylori gastritis 5. Hyperlipidemia 6. Type 2 diabetes 7. Pulmonary hypertension: RHC (3/25) with mean RA 2, PA 60/18 mean 31, mean PCWP 17, CI 2.02 thermo, CI 2.52 Fick, PAPi 21, PVR 3.7 WU. - V/Q scan (3/25): No chronic PE.  - HRCT chest (3/25): bibasilar bronchiectasis, ?aspiration.  - ANA, RF, anti-SCL70 Ab, anti-centromere Ab all negative.  8. Pleural effusion: On right, s/p thoracentesis.   Current Outpatient Medications  Medication Sig Dispense Refill   acetaminophen  (TYLENOL ) 325 MG tablet Take 1-2 tablets (325-650 mg total) by mouth every 4 (four) hours as needed for mild pain (pain score 1-3).     amiodarone  (PACERONE ) 200 MG tablet Take 1 tablet (200 mg total) by mouth daily. 30 tablet 5   apixaban  (ELIQUIS ) 5 MG TABS tablet Take 1 tablet (5 mg total) by mouth 2 (two) times daily.     Ascorbic Acid (VITAMIN C PO) Take 1 tablet by mouth daily with lunch.     clopidogrel  (PLAVIX ) 75 MG tablet Take 1 tablet (75 mg total) by mouth daily. 30 tablet 2   Cyanocobalamin (B-12 PO) Take 1 tablet by mouth daily with lunch.     digoxin  (LANOXIN ) 0.125 MG tablet Take 1 tablet (0.125 mg total) by mouth daily. 90 tablet 3   empagliflozin  (JARDIANCE ) 10 MG TABS tablet Take 1 tablet (10 mg total) by mouth daily before breakfast.     ezetimibe  (ZETIA ) 10 MG tablet Take 1 tablet (10 mg total) by mouth daily. 90 tablet 3   levothyroxine  (SYNTHROID ) 75 MCG tablet Take 1 tablet (75 mcg total) by mouth daily. 90 tablet 0   MAGNESIUM PO Take 1 tablet by mouth at bedtime.     mirtazapine  (REMERON ) 15 MG tablet Take 1 tablet (15 mg total) by mouth at bedtime. 30 tablet 2   ondansetron  (ZOFRAN -ODT) 4 MG disintegrating tablet Take 1 tablet (4 mg total) by mouth every 8 (eight) hours as needed. 20 tablet 0   pantoprazole  (PROTONIX ) 40 MG tablet Take 1  tablet (40 mg total) by mouth 2 (two) times daily before a meal. 180 tablet 3   potassium chloride  SA (KLOR-CON  M) 20 MEQ tablet Take 2 tablets (40 mEq total) by mouth daily. 90 tablet 3   rosuvastatin  (CRESTOR ) 40 MG tablet Take 40 mg by mouth daily.     tadalafil  (CIALIS ) 20 MG tablet Take 1 tablet (20 mg total) by mouth daily. 90 tablet 3   torsemide  (DEMADEX ) 20 MG tablet Take 2 tablets (40 mg total) by mouth 2 (two) times daily. 180 tablet 3   Vitamin D-Vitamin K (VITAMIN K2-VITAMIN D3 PO) Take 1 tablet by mouth daily with lunch.     zinc gluconate 50 MG tablet Take 50 mg by mouth daily with lunch.     No current facility-administered medications for this encounter.   Allergies  Allergen Reactions   Beef-Derived Drug Products  Other (See Comments)    Religious observance   Pork-Derived Products Other (See Comments)    Religious observance   Family History  Problem Relation Age of Onset   Diabetes Mother    Chronic Renal Failure Father    Diabetes Father    Chronic Renal Failure Brother    Diabetes Brother    Diabetes Brother    Heart attack Brother    Stroke Brother    Wt Readings from Last 3 Encounters:  06/13/23 67.1 kg (148 lb)  06/12/23 65.8 kg (145 lb)  06/01/23 64.5 kg (142 lb 3.2 oz)   BP 96/70   Pulse 78   Wt 67.1 kg (148 lb)   SpO2 98%   BMI 20.07 kg/m   PHYSICAL EXAM: General:  NAD. No resp difficulty, walked into clinic, thin HEENT: Normal Neck: Supple. No JVD. Cor: Regular rate & rhythm. No rubs, gallops or murmurs. Lungs: Clear, decreased  bases Abdomen: Soft, nontender, nondistended.  Extremities: No cyanosis, clubbing, rash, trace pedal edema Neuro: Alert & oriented x 3, moves all 4 extremities w/o difficulty. Affect pleasant.  ASSESSMENT & PLAN: 1. Chronic systolic CHF/ischemic cardiomyopathy:  Echo 12/24 with EF 20-25%, no LV thrombus, AK mid septum to apex, moderately reduced RV systolic function. Echo 2/25 with EF 25%, mildly reduced RV  function.  Low BP has limited GDMT titration.  His profound fatigue has been concerning for low output HF. RHC 3/25 showed low CI by thermo (2.05) but preserved by Fick.  Borderline elevated PCWP, normal RA pressure.  There was also moderate mixed pulmonary arterial/pulmonary venous hypertension.  With his significant symptoms, he was started on milrinone  0.25 and added sildenafil  to try to lower PA pressure.  Milrinone  was later weaned off with medication adjustment, and co-ox remained excellent.  Repeat echo 3/25 showed EF 25-30%, mild RV dysfunction, moderate MR, PASP 62 mmHg. Dr. Mitzie Anda discussed LVAD during last admission, need to follow him very closely. He was able to wean off milrinone  at last admission without much difficulty. Today, marginally improved NYHA II symptoms, does not appear volume overloaded by exam, mild pedal edema. - Give Rx for compression hose. - BP remains marginal, start midodrine 2.5 mg tid, hopefully this will allow for better BP for diuresis. - Continue torsemide  40 mg bid + 40 KCL daily. BMET and BNP today. - Continue Jardiance  10 mg daily. - Continue digoxin  0.125 mg daily. Check digoxin  level today. - Off spironolactone  with hypotension - off Toprol  XL with recent low output.   - s/p Biotronik ICD 5/25. 2. CAD:  Hx inferior STEMI 10/23 s/p PCI/DES to RCA. Anterior STEMI 12/10/22, cath showed occluded pLAD treated with PCI/DES, prior RCA stent patent, severe disease OM1 and distal LCx treated medically. No chest pain.  - Continue Plavix .   - No ASA given Eliquis  use.  - Continue rosuvastatin  40 mg + Zetia  10 mg daily. 3. Atrial fibrillation: Paroxysmal. NSR on ECG today. - Continue Eliquis  5 mg bid. No bleeding issues. - Continue amiodarone  200 mg daily, amiodarone  labs are UTD. He will need a regular eye exam.  4. DM II: Per PCP. Most recent A1C 7.0 - No change. 5. 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 Not a  smoker, no known lung disease. CXR has shown some chronic interstitial changes. HRCT chest with bibasilar bronchiectasis that may be due to aspiration (though swallow study negative).  V/Q scan showed no chronic PE. Rheumatologic serologies negative - Continue tadalafil  20 mg daily. (Recently  decreased due to hypotension) 6. Pleural effusion: Moderate on right. S/p right thoracentesis 3/25. Pleural fluid was transudative by Light's criteria, no malignant cells. However, it grew E coli.  - Recent CXR (06/02/23) shows small to moderate right pleural effusion. May need to revisit thoracentesis vs push diuretics if renal function allows. 7. GI: Has had significant weight loss, early satiety, GI discomfort.  CT abdomen/pelvis with gastric wall thickening. GI was consulted and EGD was performed, no definite evidence for malignancy, biopsies taken for H pylori testing and pathology => negative for H pylori and no gastric cancer. Heart failure may play a role in the symptoms. ? if dysgeusia related to his amiodarone . - Continue PPI - Continue GI follow up  Follow up in 4-6 weeks with Dr. Mitzie Anda. May need to consider VAD sooner rather than later.   Arlice Bene [facility] FNP-BC 06/13/23

## 2023-06-12 NOTE — Assessment & Plan Note (Addendum)
 Hemoglobin A1c today 7.0. commended patient on the progress.   Continue Jardiance  10 mg once daily.  Foot exam, pneumonia vaccine, urine ACR UTD.  Scheduled for diabetic eye exam.  F/u in 3 months.  Scheduled to see endocrinology in August.

## 2023-06-12 NOTE — Assessment & Plan Note (Signed)
 Had ICD implant on 06/01/23. He is concerned about the site.  Schedule to see them later today for wound check.

## 2023-06-12 NOTE — Patient Instructions (Signed)

## 2023-06-12 NOTE — Telephone Encounter (Signed)
 Called to confirm/remind patient of their appointment at the [facility] on 06/13/23.   Appointment:   [] Confirmed  [x] Left mess   [] No answer/No voice mail  [] VM Full/unable to leave message  [] Phone not in service  And to bring in all medications and/or complete list.

## 2023-06-12 NOTE — Progress Notes (Signed)
 Established Patient Office Visit  Subjective   Patient ID: Garrett Roberts, male    DOB: [birth year]  Age: 68 y.o. MRN: [id number]  Chief Complaint  Patient presents with   Diabetes    Patient here today for DM f/u     Diabetes Pertinent negatives for hypoglycemia include no dizziness, headaches or nervousness/anxiousness. Pertinent negatives for diabetes include no chest pain and no polydipsia.    Garrett Roberts is a 68 year old male with past medical history of CAD s/p inferior STEMI 10/2021 treated with DES to RCA and recent anterior STEMI on 12/10/2022 s/p DES to LAD, chronic systolic heart failure, paroxysmal AF, DM type II, ICM, CKD stage II, HTN, HLD presents today for diabetes f/u.   DM2: Current medications include: Jardiance  10 mg once daily.  He is checking his blood glucose couple times a week and is getting readings of 130s in the morning and post meals is usually in the 170s.  Last A1C: 7.8 on 02/16/23 Last Eye Exam: due; scheduled.  Last Foot Exam: UTD. Pneumonia Vaccination: UTD. Urine Microalbumin: UTD Statin: crestor    Dietary changes since last visit: he has been making better eating choices.   Exercise: limited exercise; recently ICD placement.  Hypothyroidism: currently managed on Levothyroxine  25 mcg once daily. He has been taking his medication correct. Still feeling tired all the time. He is scheduled to see endocrinology on 08/17/23.   Patient Active Problem List   Diagnosis Date Noted   Presence of heart assist device (HCC) 06/12/2023   Chronic systolic heart failure (HCC) 06/01/2023   Poor appetite 04/19/2023   Hospital discharge follow-up 04/05/2023   Subclinical hypothyroidism 04/05/2023   Loss of weight 03/23/2023   Protein-calorie malnutrition, severe 03/22/2023   Acute on chronic systolic CHF (congestive heart failure) (HCC) 02/17/2023   Ischemic cardiomyopathy 12/19/2022   Chronic HFrEF (heart failure with reduced ejection  fraction) (HCC) 12/19/2022   CKD (chronic kidney disease), 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   Type 2 diabetes mellitus with complication, without long-term current use of insulin  (HCC) 12/15/2022   Atrial fibrillation (HCC) 12/14/2022   Constipation 12/12/2022   Left sided abdominal pain 12/11/2022   Abnormal finding on GI tract imaging 12/11/2022   CAD (coronary artery disease) 10/11/2022   Essential hypertension 10/11/2022   Hyperlipidemia 10/11/2022   Vitamin D deficiency 08/03/2015   Past Medical History:  Diagnosis Date   Acute ST elevation myocardial infarction (STEMI) of anterior wall (HCC) 12/10/2022   Acute ST elevation myocardial infarction (STEMI) of inferior wall (HCC) 10/30/2021   Atrial fibrillation (HCC)    Atrial fibrillation with RVR (HCC) 12/19/2022   CHF (congestive heart failure) (HCC)    Coronary artery disease    Diabetes mellitus without complication (HCC)    DM (diabetes mellitus) (HCC)    H. pylori infection    Heart attack (HCC)    HLD (hyperlipidemia)    Hypertension    ST elevation myocardial infarction involving left anterior descending (LAD) coronary artery (HCC) 12/10/2022   Past Surgical History:  Procedure Laterality Date   CARDIOVERSION N/A 12/14/2022   Procedure: CARDIOVERSION;  Surgeon: Wendie Hamburg, MD;  Location: [facility] INVASIVE CV LAB;  Service: Cardiovascular;  Laterality: N/A;   CORONARY/GRAFT ACUTE MI REVASCULARIZATION N/A 10/30/2021   Procedure: Coronary/Graft Acute MI Revascularization;  Surgeon: Knox Perl, MD;  Location: [facility] INVASIVE CV LAB;  Service: Cardiovascular;  Laterality: N/A;   CORONARY/GRAFT ACUTE MI REVASCULARIZATION N/A 12/10/2022   Procedure: Coronary/Graft Acute MI Revascularization;  Surgeon: Arlester Ladd,  Bambi Lever, MD;  Location: [facility] INVASIVE CV LAB;  Service: Cardiovascular;  Laterality: N/A;   ESOPHAGOGASTRODUODENOSCOPY N/A 03/23/2023   Procedure: EGD (ESOPHAGOGASTRODUODENOSCOPY);  Surgeon: Elois Hair, MD;  Location: [facility]  ENDOSCOPY;  Service: Gastroenterology;  Laterality: N/A;   ICD IMPLANT N/A 06/01/2023   Procedure: ICD IMPLANT;  Surgeon: Ardeen Kohler, MD;  Location: [facility] INVASIVE CV LAB;  Service: Cardiovascular;  Laterality: N/A;   LEFT HEART CATH AND CORONARY ANGIOGRAPHY N/A 10/30/2021   Procedure: LEFT HEART CATH AND CORONARY ANGIOGRAPHY;  Surgeon: Knox Perl, MD;  Location: [facility] INVASIVE CV LAB;  Service: Cardiovascular;  Laterality: N/A;   LEFT HEART CATH AND CORONARY ANGIOGRAPHY N/A 12/10/2022   Procedure: LEFT HEART CATH AND CORONARY ANGIOGRAPHY;  Surgeon: Arnoldo Lapping, MD;  Location: [facility] INVASIVE CV LAB;  Service: Cardiovascular;  Laterality: N/A;   RIGHT HEART CATH N/A 03/20/2023   Procedure: RIGHT HEART CATH;  Surgeon: Darlis Eisenmenger, MD;  Location: [facility] INVASIVE CV LAB;  Service: Cardiovascular;  Laterality: N/A;   Allergies  Allergen Reactions   Beef-Derived Drug Products Other (See Comments)    Religious observance   Pork-Derived Products Other (See Comments)    Religious observance         06/12/2023   11:09 AM 04/05/2023    2:33 PM  Depression screen PHQ 2/9  Decreased Interest 1 0  Down, Depressed, Hopeless 0 0  PHQ - 2 Score 1 0  Altered sleeping 0 0  Tired, decreased energy 2 0  Change in appetite 1 0  Feeling bad or failure about yourself  0 0  Trouble concentrating 0 0  Moving slowly or fidgety/restless 0 0  Suicidal thoughts 0 0  PHQ-9 Score 4 0  Difficult doing work/chores Not difficult at all Not difficult at all       06/12/2023   11:09 AM 04/05/2023    2:33 PM  GAD 7 : Generalized Anxiety Score  Nervous, Anxious, on Edge 0 0  Control/stop worrying 0 0  Worry too much - different things 0 0  Trouble relaxing 0 0  Restless 0 0  Easily annoyed or irritable 0 0  Afraid - awful might happen 0 0  Total GAD 7 Score 0 0  Anxiety Difficulty Not difficult at all Not difficult at all      Review of Systems  Constitutional:  Positive for malaise/fatigue. Negative for  chills and fever.  Respiratory:  Negative for shortness of breath.   Cardiovascular:  Negative for chest pain.  Gastrointestinal:  Negative for abdominal pain, constipation, diarrhea, heartburn, nausea and vomiting.  Genitourinary:  Negative for dysuria, frequency and urgency.  Neurological:  Negative for dizziness and headaches.  Endo/Heme/Allergies:  Negative for polydipsia.  Psychiatric/Behavioral:  Negative for depression and suicidal ideas. The patient is not nervous/anxious.       Objective:     BP 112/60 (BP Location: Left Arm, Patient Position: Sitting, Cuff Size: Normal)   Pulse 75   Temp 97.8 F (36.6 C) (Oral)   Ht 6' (1.829 m)   Wt 145 lb (65.8 kg)   SpO2 98%   BMI 19.67 kg/m  BP Readings from Last 3 Encounters:  06/12/23 112/60  06/02/23 98/68  05/23/23 90/60   Wt Readings from Last 3 Encounters:  06/12/23 145 lb (65.8 kg)  06/01/23 142 lb 3.2 oz (64.5 kg)  05/23/23 148 lb 9.6 oz (67.4 kg)      Physical Exam Vitals and nursing note reviewed.  Constitutional:  Appearance: Normal appearance.  Cardiovascular:     Rate and Rhythm: Normal rate and regular rhythm.     Pulses: Normal pulses.     Heart sounds: Normal heart sounds.  Pulmonary:     Effort: Pulmonary effort is normal.     Breath sounds: Normal breath sounds.  Neurological:     Mental Status: He is alert and oriented to person, place, and time.  Psychiatric:        Mood and Affect: Mood normal.        Behavior: Behavior normal.        Thought Content: Thought content normal.        Judgment: Judgment normal.      Results for orders placed or performed in visit on 06/12/23  POCT HgB A1C  Result Value Ref Range   Hemoglobin A1C 7.0 (A) 4.0 - 5.6 %   HbA1c POC (<> result, manual entry)     HbA1c, POC (prediabetic range)     HbA1c, POC (controlled diabetic range)         The ASCVD Risk score (Arnett DK, et al., 2019) failed to calculate for the following reasons:   Risk score  cannot be calculated because patient has a medical history suggesting prior/existing ASCVD    Assessment & Plan:  Type 2 diabetes mellitus with complication, without long-term current use of insulin  (HCC) Assessment & Plan: Hemoglobin A1c today 7.0. commended patient on the progress.   Continue Jardiance  10 mg once daily.  Foot exam, pneumonia vaccine, urine ACR UTD.  Scheduled for diabetic eye exam.  F/u in 3 months.  Scheduled to see endocrinology in August.  Orders: -     POCT glycosylated hemoglobin (Hb A1C)  Subclinical hypothyroidism Assessment & Plan: Increase levothyroxine  75 mcg once daily.  Scheduled to see endocrinology in August.  TSH level pending.  Orders: -     Levothyroxine  Sodium; Take 1 tablet (75 mcg total) by mouth daily.  Dispense: 90 tablet; Refill: 0 -     TSH; Future  Presence of heart assist device [facility]) Assessment & Plan: Had ICD implant on 06/01/23. He is concerned about the site.  Schedule to see them later today for wound check.    Essential hypertension Assessment & Plan: At goal today.  Following with cardiology.  Reports low readings at the hospital. Much better today.    Pure hypercholesterolemia Assessment & Plan: controlled.  Continue rosuvastatin  40 mg once daily.  Lipid panel at next visit.    Return in about 3 months (around 09/12/2023) for chronic care management and lipid labs.    Jolanda Nation, NP

## 2023-06-12 NOTE — Progress Notes (Signed)
 Normal single chamber ICD wound check. Wound well healed. Presenting rhythm: AS/VS 75. Routine testing performed. Thresholds, sensing, and impedances consistent with implant measurements with 3.5V safety margin/auto capture until 3 month visit. No treated arrhythmias. Reviewed arm restrictions to continue for 6 weeks total post op. Reviewed shock plan.  Pt enrolled in remote follow-up.

## 2023-06-12 NOTE — Patient Instructions (Addendum)
 Start levothyroxine  75 mcg once daily. Be sure to take your levothyroxine  (thyroid  medication) every morning on an empty stomach with water only.   No food or other medications for 30 minutes.   No heartburn medication, iron pills, calcium , vitamin D, or magnesium pills within four hours of taking levothyroxine .   Schedule tdap and shingrix at the pharmacy.   Follow up in 3 months for regular follow up and cholesterol check. Please come fasting.  It was a pleasure to see you today!

## 2023-06-12 NOTE — Assessment & Plan Note (Signed)
 At goal today.  Following with cardiology.  Reports low readings at the hospital. Much better today.

## 2023-06-13 ENCOUNTER — Ambulatory Visit (HOSPITAL_COMMUNITY): Payer: Self-pay | Admitting: Family Medicine

## 2023-06-13 ENCOUNTER — Encounter (HOSPITAL_COMMUNITY): Payer: Self-pay

## 2023-06-13 ENCOUNTER — Other Ambulatory Visit (HOSPITAL_COMMUNITY): Payer: Self-pay

## 2023-06-13 ENCOUNTER — Ambulatory Visit (HOSPITAL_COMMUNITY)
Admission: RE | Admit: 2023-06-13 | Discharge: 2023-06-13 | Disposition: A | Discharge status: Home / Self Care | Source: Ambulatory Visit | Attending: Family Medicine | Admitting: Family Medicine

## 2023-06-13 VITALS — BP 96/70 | HR 78 | Wt 148.0 lb

## 2023-06-13 DIAGNOSIS — I480 Paroxysmal atrial fibrillation: Secondary | ICD-10-CM

## 2023-06-13 DIAGNOSIS — Z7902 Long term (current) use of antithrombotics/antiplatelets: Secondary | ICD-10-CM | POA: Diagnosis not present

## 2023-06-13 DIAGNOSIS — Z7984 Long term (current) use of oral hypoglycemic drugs: Secondary | ICD-10-CM | POA: Diagnosis not present

## 2023-06-13 DIAGNOSIS — Z79899 Other long term (current) drug therapy: Secondary | ICD-10-CM | POA: Diagnosis not present

## 2023-06-13 DIAGNOSIS — I2510 Atherosclerotic heart disease of native coronary artery without angina pectoris: Secondary | ICD-10-CM | POA: Diagnosis not present

## 2023-06-13 DIAGNOSIS — I110 Hypertensive heart disease with heart failure: Secondary | ICD-10-CM | POA: Diagnosis not present

## 2023-06-13 DIAGNOSIS — J90 Pleural effusion, not elsewhere classified: Secondary | ICD-10-CM | POA: Diagnosis not present

## 2023-06-13 DIAGNOSIS — I272 Pulmonary hypertension, unspecified: Secondary | ICD-10-CM | POA: Diagnosis not present

## 2023-06-13 DIAGNOSIS — I252 Old myocardial infarction: Secondary | ICD-10-CM | POA: Diagnosis not present

## 2023-06-13 DIAGNOSIS — Z7901 Long term (current) use of anticoagulants: Secondary | ICD-10-CM | POA: Insufficient documentation

## 2023-06-13 DIAGNOSIS — E785 Hyperlipidemia, unspecified: Secondary | ICD-10-CM | POA: Insufficient documentation

## 2023-06-13 DIAGNOSIS — Z955 Presence of coronary angioplasty implant and graft: Secondary | ICD-10-CM | POA: Diagnosis not present

## 2023-06-13 DIAGNOSIS — I5022 Chronic systolic (congestive) heart failure: Secondary | ICD-10-CM | POA: Diagnosis not present

## 2023-06-13 DIAGNOSIS — I255 Ischemic cardiomyopathy: Secondary | ICD-10-CM | POA: Insufficient documentation

## 2023-06-13 DIAGNOSIS — E119 Type 2 diabetes mellitus without complications: Secondary | ICD-10-CM

## 2023-06-13 LAB — BASIC METABOLIC PANEL WITH GFR
Anion gap: 10 (ref 5–15)
BUN: 26 mg/dL — ABNORMAL HIGH (ref 8–23)
CO2: 29 mmol/L (ref 22–32)
Calcium: 9.7 mg/dL (ref 8.9–10.3)
Chloride: 100 mmol/L (ref 98–111)
Creatinine, Ser: 1.27 mg/dL — ABNORMAL HIGH (ref 0.61–1.24)
GFR, Estimated: >60 mL/min (ref >60)
Glucose, Bld: 238 mg/dL — ABNORMAL HIGH (ref 70–99)
Potassium: 4.3 mmol/L (ref 3.5–5.1)
Sodium: 139 mmol/L (ref 135–145)

## 2023-06-13 LAB — T4, FREE: Free T4: 0.71 ng/dL (ref 0.61–1.12)

## 2023-06-13 LAB — TSH: TSH: 22.402 u[IU]/mL — ABNORMAL HIGH (ref 0.350–4.500)

## 2023-06-13 LAB — BRAIN NATRIURETIC PEPTIDE: B Natriuretic Peptide: 1435.9 pg/mL — ABNORMAL HIGH (ref 0.0–100.0)

## 2023-06-13 MED ORDER — MIDODRINE HCL 2.5 MG PO TABS
2.5000 mg | ORAL_TABLET | Freq: Three times a day (TID) | ORAL | 3 refills | Status: DC
  Filled 2023-06-13: qty 90, 30d supply, fill #0

## 2023-06-13 NOTE — Patient Instructions (Signed)
 Medication Changes:  START: MIDODRINE 2.5MG  THREE TIMES DAILY   Lab Work:  Labs done today, your results will be available in MyChart, we will contact you for abnormal readings.  Special Instructions // Education:  ORDER GIVEN FOR COMPRESSION STOCKINGS   Follow-Up in: 6 WEEKS AS SCHEDULED   At the [facility], you and your health needs are our priority. We have a designated team specialized in the treatment of Heart Failure. This Care Team includes your primary Heart Failure Specialized Cardiologist (physician), Advanced Practice Providers (APPs- Physician Assistants and Nurse Practitioners), and Pharmacist who all work together to provide you with the care you need, when you need it.   You may see any of the following providers on your designated Care Team at your next follow up:  Dr. Jules Oar Dr. Peder Bourdon Dr. Alwin Baars Dr. Judyth Nunnery Nieves Bars, NP Ruddy Corral, [facility] [place], [place] Dennise Fitz, NP [place] Lee, NP Luster Salters, PharmD   Please be sure to bring in all your medications bottles to every appointment.   Need to Contact Us :  If you have any questions or concerns before your next appointment please send us  a message through [place] or call our office at [phone].    TO LEAVE A MESSAGE FOR THE NURSE SELECT OPTION 2, PLEASE LEAVE A MESSAGE INCLUDING: YOUR NAME DATE OF BIRTH CALL BACK NUMBER REASON FOR CALL**this is important as we prioritize the call backs  YOU WILL RECEIVE A CALL BACK THE SAME DAY AS LONG AS YOU CALL BEFORE 4:00 PM

## 2023-06-13 NOTE — Addendum Note (Signed)
 Encounter addended by: Cierah Crader M, CMA on: 06/13/2023 1:58 PM  Actions taken: Order list changed, Diagnosis association updated

## 2023-06-14 ENCOUNTER — Encounter: Payer: Self-pay | Admitting: *Deleted

## 2023-06-14 ENCOUNTER — Other Ambulatory Visit (HOSPITAL_COMMUNITY): Payer: Self-pay

## 2023-06-15 ENCOUNTER — Ambulatory Visit

## 2023-06-16 ENCOUNTER — Other Ambulatory Visit (HOSPITAL_COMMUNITY): Payer: Self-pay

## 2023-06-16 ENCOUNTER — Ambulatory Visit: Admitting: Physician Assistant

## 2023-06-16 ENCOUNTER — Ambulatory Visit: Payer: Self-pay | Admitting: Physician Assistant

## 2023-06-16 ENCOUNTER — Ambulatory Visit (INDEPENDENT_AMBULATORY_CARE_PROVIDER_SITE_OTHER)
Admission: RE | Admit: 2023-06-16 | Discharge: 2023-06-16 | Disposition: A | Discharge status: Home / Self Care | Source: Ambulatory Visit | Attending: Physician Assistant | Admitting: Physician Assistant

## 2023-06-16 ENCOUNTER — Encounter: Payer: Self-pay | Admitting: Physician Assistant

## 2023-06-16 VITALS — BP 102/60 | HR 74 | Ht 72.0 in | Wt 150.0 lb

## 2023-06-16 DIAGNOSIS — I2721 Secondary pulmonary arterial hypertension: Secondary | ICD-10-CM | POA: Diagnosis not present

## 2023-06-16 DIAGNOSIS — R1084 Generalized abdominal pain: Secondary | ICD-10-CM | POA: Diagnosis not present

## 2023-06-16 DIAGNOSIS — R1032 Left lower quadrant pain: Secondary | ICD-10-CM

## 2023-06-16 DIAGNOSIS — I2510 Atherosclerotic heart disease of native coronary artery without angina pectoris: Secondary | ICD-10-CM

## 2023-06-16 DIAGNOSIS — K5900 Constipation, unspecified: Secondary | ICD-10-CM | POA: Diagnosis not present

## 2023-06-16 DIAGNOSIS — K5641 Fecal impaction: Secondary | ICD-10-CM | POA: Diagnosis not present

## 2023-06-16 DIAGNOSIS — Z7901 Long term (current) use of anticoagulants: Secondary | ICD-10-CM | POA: Diagnosis not present

## 2023-06-16 DIAGNOSIS — A048 Other specified bacterial intestinal infections: Secondary | ICD-10-CM

## 2023-06-16 DIAGNOSIS — I509 Heart failure, unspecified: Secondary | ICD-10-CM | POA: Diagnosis not present

## 2023-06-16 DIAGNOSIS — I4891 Unspecified atrial fibrillation: Secondary | ICD-10-CM

## 2023-06-16 DIAGNOSIS — R109 Unspecified abdominal pain: Secondary | ICD-10-CM

## 2023-06-16 DIAGNOSIS — R634 Abnormal weight loss: Secondary | ICD-10-CM

## 2023-06-16 DIAGNOSIS — I5022 Chronic systolic (congestive) heart failure: Secondary | ICD-10-CM

## 2023-06-16 DIAGNOSIS — Z8619 Personal history of other infectious and parasitic diseases: Secondary | ICD-10-CM | POA: Diagnosis not present

## 2023-06-16 MED ORDER — LINACLOTIDE 145 MCG PO CAPS: 145.0000 ug | ORAL_CAPSULE | Freq: Every day | ORAL | Status: DC

## 2023-06-16 NOTE — Progress Notes (Addendum)
 06/16/2023 Garrett Roberts [id number] [birth year]  Referring provider: Jolanda Nation, NP Primary GI doctor: Dr. 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 pylori infection stool test treated with quadruple therapy 03/23/2023 EGD Dr. Cherryl Corona for abnormal CT showed normal esophagus, 4 cm HH, granular gastric mucosa normal duodenum no evidence of malignancy.   Path negative H. pylori negative dysplasia and malignancy did show mild chronic gastritis and focal intestinal metaplasia, no recommendation for routine surveillance Office visit 04/2023 started on mirtazapine  50 mg at bedtime Continue pantoprazole  40 mg twice daily No plan to repeat EGD  Constipation 520 08/2013 colonoscopy at [facility] for abdominal discomfort and lower abdominal pain good preparation completely unremarkable recall 10 years Seen on CT abdomen pelvis March 2025 in the hospital Status post MiraLAX  purge 04/14/2023 then MiraLAX  daily Continuing constipation, has not had BM in 4 days He is not on miralax  daily, has dicomfort, bloating No melena, no hematochezia - get KUB to rule out obstruction - do miralax  purge and then on linzess 145 mcg, can increase to 290 mcg - if normal start on linzess 145, can increase to 290 - increase activity as able - add on IBGard - Patient  very high risk with recent NSTEM, pulmonary hypertension, on dual platelet therapy, will reevaluate in 2 to 3 months, ideally would want a year from previous heart attack/stent, normal CT, no hematochezia.  CAD  10/2021 status post PCI RCA STEMI 12/10/22. Cardiac cath with 100% proximal LAD treated with PCI/DES. Prior RCA stent patent, severe disease in tortuous ramus and  distal LCx treated medically.  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  S/p Biotronik ICD 5/25.  Transudative pleural effusion 03/2023 He had a moderate right pleural effusion, underwent right thoracentesis. Fluid was transudative but grew E coli, he was treated with Bactrim  based on sensitivities.    Patient Care Team: Jolanda Nation, NP as PCP - General (General Practice) Cody Das, MD as PCP - Cardiology (Cardiology) Ardeen Kohler, MD as PCP - Electrophysiology (Cardiology)  HISTORY OF PRESENT ILLNESS: 68 y.o. male with a past medical history coronary artery disease status post non-STEMI 2023 PCI DES to RCA, heart failure ejection fraction 40 to 45%, diabetes hypertension and others listed below presents for hospital follow-up 3/10-3/14/2025 for abnormal CT.  Discussed the use of AI scribe software for clinical note transcription with the patient, who gave verbal consent to proceed.  History of Present Illness   Garrett Roberts "Garrett Roberts" is a 68 year old male with atrial fibrillation who presents with constipation.  He has been experiencing constipation for a prolonged period, with the current episode lasting four days without a bowel movement. He experiences abdominal pain, particularly in the left upper quadrant, which he associates with constipation. The pain is described as a feeling of bloating and can be severe enough to prevent walking. He has tried using Miralax  intermittently, but it has not been very effective. He also mentions using olive oil occasionally, which he feels helps. No nausea, vomiting, or blood  in the stool. A colonoscopy approximately seven to eight years ago was reportedly clear. No recent rectal exams or  significant findings on a recent CT scan of the abdomen.  He has a history of atrial fibrillation and had an AICD implanted on May 2nd. He is currently on Eliquis  and Plavix , with a follow-up planned to discuss the continuation of Plavix . He also takes mirtazapine  15 mg at night, which helps with sleep and appetite. He has experienced weight loss since a heart attack in November, losing about 30 pounds initially, but his weight has stabilized recently around 150 pounds.  He mentions a history of pulmonary hypertension and heart failure, which has limited his physical activity. He acknowledges that his reduced mobility may contribute to his constipation.      Wt Readings from Last 20 Encounters:  06/16/23 150 lb (68 kg)  06/13/23 148 lb (67.1 kg)  06/12/23 145 lb (65.8 kg)  06/01/23 142 lb 3.2 oz (64.5 kg)  05/23/23 148 lb 9.6 oz (67.4 kg)  05/02/23 152 lb 12.8 oz (69.3 kg)  04/21/23 147 lb 3.2 oz (66.8 kg)  04/14/23 146 lb (66.2 kg)  04/12/23 148 lb (67.1 kg)  04/05/23 146 lb (66.2 kg)  03/30/23 148 lb 9.6 oz (67.4 kg)  03/24/23 144 lb 13.5 oz (65.7 kg)  03/16/23 151 lb (68.5 kg)  03/15/23 151 lb 12.8 oz (68.9 kg)  03/07/23 153 lb 6.4 oz (69.6 kg)  02/27/23 156 lb 6.4 oz (70.9 kg)  02/20/23 153 lb 7 oz (69.6 kg)  02/06/23 161 lb 9.6 oz (73.3 kg)  01/20/23 169 lb 9.6 oz (76.9 kg)  01/16/23 171 lb (77.6 kg)     He  reports that he has never smoked. He has never used smokeless tobacco. He reports that he does not drink alcohol and does not use drugs.  RELEVANT GI HISTORY, IMAGING AND LABS: Results   RADIOLOGY CT abdomen: No significant findings      CBC    Component Value Date/Time   WBC 5.1 05/29/2023 1142   WBC 6.2 03/20/2023 1638   RBC 4.72 05/29/2023 1142   RBC 4.43 03/20/2023 1638   HGB 13.8 05/29/2023 1142   HCT 42.4 05/29/2023 1142   PLT 236 05/29/2023 1142   MCV 90 05/29/2023 1142   MCH 29.2 05/29/2023 1142   MCH 29.3 03/20/2023 1638   MCHC 32.5 05/29/2023  1142   MCHC 32.3 03/20/2023 1638   RDW 14.4 05/29/2023 1142   LYMPHSABS 2.3 05/29/2023 1142   MONOABS 1.0 03/20/2023 1638   EOSABS 0.2 05/29/2023 1142   BASOSABS 0.1 05/29/2023 1142   Recent Labs    12/27/22 1526 02/06/23 1025 02/16/23 2233 02/19/23 0258 02/20/23 0310 03/07/23 1215 03/14/23 1942 03/20/23 0958 03/20/23 1638 05/29/23 1142  HGB 15.1 14.9 14.6 14.4 15.3 14.4 13.4 13.9 13.0 13.8    CMP     Component Value Date/Time   NA 139 06/13/2023 0936   NA 140 05/29/2023 1142   K 4.3 06/13/2023 0936   CL 100 06/13/2023 0936   CO2 29 06/13/2023 0936   GLUCOSE 238 (H) 06/13/2023 0936   BUN 26 (H) 06/13/2023 0936   BUN 14 05/29/2023 1142   CREATININE 1.27 (H) 06/13/2023 0936   CALCIUM  9.7 06/13/2023 0936   PROT 7.4 03/30/2023 1606   ALBUMIN  3.9 03/30/2023 1606   AST 29 03/30/2023 1606   ALT 22 03/30/2023 1606   ALKPHOS 50 03/30/2023 1606   BILITOT 0.5 03/30/2023 1606   GFRNONAA >60  06/13/2023 0936   GFRAA >90 09/25/2012 1823      Latest Ref Rng & Units 03/30/2023    4:06 PM 03/22/2023   11:07 AM 03/20/2023    4:38 PM  Hepatic Function  Total Protein 6.5 - 8.1 g/dL 7.4  7.0  6.6   Albumin  3.5 - 5.0 g/dL 3.9  3.4  3.3   AST 15 - 41 U/L 29  27  22    ALT 0 - 44 U/L 22  18  17    Alk Phosphatase 38 - 126 U/L 50  40  40   Total Bilirubin 0.0 - 1.2 mg/dL 0.5  1.0  0.8   Bilirubin, Direct 0.0 - 0.2 mg/dL  0.2        Current Medications:   Current Outpatient Medications (Endocrine & Metabolic):    empagliflozin  (JARDIANCE ) 10 MG TABS tablet, Take 1 tablet (10 mg total) by mouth daily before breakfast.   levothyroxine  (SYNTHROID ) 75 MCG tablet, Take 1 tablet (75 mcg total) by mouth daily.  Current Outpatient Medications (Cardiovascular):    amiodarone  (PACERONE ) 200 MG tablet, Take 1 tablet (200 mg total) by mouth daily.   digoxin  (LANOXIN ) 0.125 MG tablet, Take 1 tablet (0.125 mg total) by mouth daily.   ezetimibe  (ZETIA ) 10 MG tablet, Take 1 tablet (10 mg total)  by mouth daily.   midodrine (PROAMATINE) 2.5 MG tablet, Take 1 tablet (2.5 mg total) by mouth 3 (three) times daily with meals.   rosuvastatin  (CRESTOR ) 40 MG tablet, Take 40 mg by mouth daily.   tadalafil  (CIALIS ) 20 MG tablet, Take 1 tablet (20 mg total) by mouth daily.   torsemide  (DEMADEX ) 20 MG tablet, Take 2 tablets (40 mg total) by mouth 2 (two) times daily.   Current Outpatient Medications (Analgesics):    acetaminophen  (TYLENOL ) 325 MG tablet, Take 1-2 tablets (325-650 mg total) by mouth every 4 (four) hours as needed for mild pain (pain score 1-3).  Current Outpatient Medications (Hematological):    apixaban  (ELIQUIS ) 5 MG TABS tablet, Take 1 tablet (5 mg total) by mouth 2 (two) times daily.   clopidogrel  (PLAVIX ) 75 MG tablet, Take 1 tablet (75 mg total) by mouth daily.   Cyanocobalamin (B-12 PO), Take 1 tablet by mouth daily with lunch.  Current Outpatient Medications (Other):    Ascorbic Acid (VITAMIN C PO), Take 1 tablet by mouth daily with lunch.   linaclotide (LINZESS) 145 MCG CAPS capsule, Take 1 capsule (145 mcg total) by mouth daily before breakfast.   MAGNESIUM PO, Take 1 tablet by mouth at bedtime.   mirtazapine  (REMERON ) 15 MG tablet, Take 1 tablet (15 mg total) by mouth at bedtime.   ondansetron  (ZOFRAN -ODT) 4 MG disintegrating tablet, Take 1 tablet (4 mg total) by mouth every 8 (eight) hours as needed.   pantoprazole  (PROTONIX ) 40 MG tablet, Take 1 tablet (40 mg total) by mouth 2 (two) times daily before a meal.   potassium chloride  SA (KLOR-CON  M) 20 MEQ tablet, Take 2 tablets (40 mEq total) by mouth daily.   Vitamin D-Vitamin K (VITAMIN K2-VITAMIN D3 PO), Take 1 tablet by mouth daily with lunch.   zinc gluconate 50 MG tablet, Take 50 mg by mouth daily with lunch.  Medical History:  Past Medical History:  Diagnosis Date   Acute ST elevation myocardial infarction (STEMI) of anterior wall (HCC) 12/10/2022   Acute ST elevation myocardial infarction (STEMI) of  inferior wall (HCC) 10/30/2021   Atrial fibrillation (HCC)    Atrial fibrillation with RVR (  HCC) 12/19/2022   CHF (congestive heart failure) (HCC)    Coronary artery disease    Diabetes mellitus without complication (HCC)    DM (diabetes mellitus) (HCC)    H. pylori infection    Heart attack (HCC)    HLD (hyperlipidemia)    Hypertension    ST elevation myocardial infarction involving left anterior descending (LAD) coronary artery (HCC) 12/10/2022   Allergies:  Allergies  Allergen Reactions   Beef-Derived Drug Products Other (See Comments)    Religious observance   Pork-Derived Products Other (See Comments)    Religious observance     Surgical History:  He  has a past surgical history that includes Coronary/Graft Acute MI Revascularization (N/A, 10/30/2021); LEFT HEART CATH AND CORONARY ANGIOGRAPHY (N/A, 10/30/2021); Coronary/Graft Acute MI Revascularization (N/A, 12/10/2022); LEFT HEART CATH AND CORONARY ANGIOGRAPHY (N/A, 12/10/2022); CARDIOVERSION (N/A, 12/14/2022); RIGHT HEART CATH (N/A, 03/20/2023); Esophagogastroduodenoscopy (N/A, 03/23/2023); and ICD IMPLANT (N/A, 06/01/2023). Family History:  His family history includes Chronic Renal Failure in his brother and father; Diabetes in his brother, brother, father, and mother; Heart attack in his brother; Stroke in his brother.  REVIEW OF SYSTEMS  : All other systems reviewed and negative except where noted in the History of Present Illness.  PHYSICAL EXAM: BP 102/60   Pulse 74   Ht 6' (1.829 m)   Wt 150 lb (68 kg)   BMI 20.34 kg/m  Physical Exam   GENERAL APPEARANCE: thin appearing,  in no apparent distress. HEENT: No cervical lymphadenopathy, unremarkable thyroid , sclerae anicteric, conjunctiva pink. RESPIRATORY: Respiratory effort normal, breath sounds clear to auscultation bilaterally without rales, rhonchi, or wheezing. CARDIO: Regular rhythm with occasional extra beats, no murmurs, rubs, or gallops, peripheral pulses intact.  AICD left upper chest.  ABDOMEN: Soft, non-distended, active bowel sounds in all four quadrants, mild tenderness on the left side, umbilical hernia present, no rebound, no mass appreciated. RECTAL: Declines. MUSCULOSKELETAL: Full range of motion, normal gait, without edema. SKIN: Dry, intact without rashes or lesions. No jaundice. NEURO: Alert, oriented, no focal deficits. PSYCH: Cooperative, normal mood and affect.      Edmonia Gottron, PA-C 11:29 AM

## 2023-06-16 NOTE — Patient Instructions (Addendum)
 Your provider has requested that you have an abdominal x ray before leaving today. Please go to the basement floor to our Radiology department for the test.  Please do the miralax  purge below and then start on the linzess 145 mcg, can increase the dose to 290 mcg  Please do the following: Purchase a bottle of Miralax  over the counter as well as a box of 5 mg dulcolax tablets. Take 4 dulcolax tablets. Wait 1 hour. You will then drink 6-8 capfuls of Miralax  mixed in an adequate amount of water/juice/gatorade (you may choose which of these liquids to drink) over the next 2-3 hours. You should expect results within 1 to 6 hours after completing the bowel purge. Go to the er if you have severe AB pain, can not pass gas or stool in over 12 hours, can not hold down any food.    Linzess  *IBS-C patients may begin to experience relief from belly pain and overall abdominal symptoms (pain, discomfort, and bloating) in about 1 week,  with symptoms typically improving over 12 weeks.  Take at least 30 minutes before the first meal of the day on an empty stomach You can have a loose stool if you eat a high-fat breakfast. Give it at least 7 days, may have more bowel movements during that time.   The diarrhea should go away and you should start having normal, complete, full bowel movements.  Toileting tips to help with your constipation - Drink at least 64-80 ounces of water/liquid per day. - Establish a time to try to move your bowels every day.  For many people, this is after a cup of coffee or after a meal such as breakfast. - Sit all of the way back on the toilet keeping your back fairly straight and while sitting up, try to rest the tops of your forearms on your upper thighs.   - Raising your feet with a step stool/squatty potty can be helpful to improve the angle that allows your stool to pass through the rectum. - Relax the rectum feeling it bulge toward the toilet water.  If you feel your rectum  raising toward your body, you are contracting rather than relaxing. - Breathe in and slowly exhale. "Belly breath" by expanding your belly towards your belly button. Keep belly expanded as you gently direct pressure down and back to the anus.  A low pitched GRRR sound can assist with increasing intra-abdominal pressure.  (Can also trying to blow on a pinwheel and make it move, this helps with the same belly breathing) - Repeat 3-4 times. If unsuccessful, contract the pelvic floor to restore normal tone and get off the toilet.  Avoid excessive straining. - To reduce excessive wiping by teaching your anus to normally contract, place hands on outer aspect of knees and resist knee movement outward.  Hold 5-10 second then place hands just inside of knees and resist inward movement of knees.  Hold 5 seconds.  Repeat a few times each way.  Go to the ER if unable to pass gas, severe AB pain, unable to hold down food, any shortness of breath of chest pain.  First do a trial off milk/lactose products if you use them.  Add fiber like benefiber or citracel once a day Increase activity Can do trial of IBGard which is over the counter for AB pain- Take 1-2 capsules once a day for maintence or twice a day during a flare  For IBS and peppermint oil.  Peppermint oil  has been proven to be better than placebo for cramping for IBS Stop if it worsens heart burn or causes flushing of your face.  Ideally enteric coated peppermint oil capsules 2 a day is best but if you got the oil, you can use 0.76ml or 180 mg of pepperment oil up to 3 x a day.    It may be helpful to start treatment when you can be near the comfort of your own bathroom, such as a weekend.  After you are out we can send in a prescription if you did well, there is a prescription card  You can add on protein and things high calorie for weight gain - Can add ensure/boost to ice cream for a shake - can add protein powder to oatmeal after cooked or to a  fruit smooth - avocado has high calorie, good to add to proteins - nuts and peanut butter are good to eat or add to yogurt/smoothies - [place] yogurt is good

## 2023-06-19 NOTE — Progress Notes (Signed)
 I agree with the assessment and plan as outlined by Ms. Steffanie Dunn.

## 2023-06-22 DIAGNOSIS — Z419 Encounter for procedure for purposes other than remedying health state, unspecified: Secondary | ICD-10-CM | POA: Diagnosis not present

## 2023-06-26 ENCOUNTER — Other Ambulatory Visit (HOSPITAL_COMMUNITY): Payer: Self-pay

## 2023-06-27 DIAGNOSIS — H40013 Open angle with borderline findings, low risk, bilateral: Secondary | ICD-10-CM | POA: Diagnosis not present

## 2023-06-27 DIAGNOSIS — H2513 Age-related nuclear cataract, bilateral: Secondary | ICD-10-CM | POA: Diagnosis not present

## 2023-06-27 DIAGNOSIS — E119 Type 2 diabetes mellitus without complications: Secondary | ICD-10-CM | POA: Diagnosis not present

## 2023-06-27 LAB — HM DIABETES EYE EXAM

## 2023-06-30 ENCOUNTER — Inpatient Hospital Stay (HOSPITAL_COMMUNITY)
Admission: EM | Admit: 2023-06-30 | Discharge: 2023-07-05 | DRG: 286 | Disposition: A | Discharge status: Home / Self Care | Attending: Internal Medicine | Admitting: Internal Medicine

## 2023-06-30 ENCOUNTER — Encounter (HOSPITAL_COMMUNITY)
Admission: EM | Disposition: A | Discharge status: Home / Self Care | Payer: Self-pay | Source: Home / Self Care | Attending: Family Medicine

## 2023-06-30 ENCOUNTER — Other Ambulatory Visit: Payer: Self-pay

## 2023-06-30 ENCOUNTER — Encounter (HOSPITAL_COMMUNITY): Payer: Self-pay

## 2023-06-30 ENCOUNTER — Emergency Department (HOSPITAL_COMMUNITY)

## 2023-06-30 DIAGNOSIS — K219 Gastro-esophageal reflux disease without esophagitis: Secondary | ICD-10-CM | POA: Diagnosis present

## 2023-06-30 DIAGNOSIS — I130 Hypertensive heart and chronic kidney disease with heart failure and stage 1 through stage 4 chronic kidney disease, or unspecified chronic kidney disease: Principal | ICD-10-CM | POA: Diagnosis present

## 2023-06-30 DIAGNOSIS — E1122 Type 2 diabetes mellitus with diabetic chronic kidney disease: Secondary | ICD-10-CM | POA: Diagnosis present

## 2023-06-30 DIAGNOSIS — Z8249 Family history of ischemic heart disease and other diseases of the circulatory system: Secondary | ICD-10-CM

## 2023-06-30 DIAGNOSIS — I272 Pulmonary hypertension, unspecified: Secondary | ICD-10-CM

## 2023-06-30 DIAGNOSIS — I4891 Unspecified atrial fibrillation: Secondary | ICD-10-CM | POA: Diagnosis present

## 2023-06-30 DIAGNOSIS — E118 Type 2 diabetes mellitus with unspecified complications: Secondary | ICD-10-CM | POA: Diagnosis not present

## 2023-06-30 DIAGNOSIS — E039 Hypothyroidism, unspecified: Secondary | ICD-10-CM | POA: Diagnosis not present

## 2023-06-30 DIAGNOSIS — N289 Disorder of kidney and ureter, unspecified: Secondary | ICD-10-CM | POA: Diagnosis not present

## 2023-06-30 DIAGNOSIS — Z79899 Other long term (current) drug therapy: Secondary | ICD-10-CM

## 2023-06-30 DIAGNOSIS — Z79890 Hormone replacement therapy (postmenopausal): Secondary | ICD-10-CM

## 2023-06-30 DIAGNOSIS — Z681 Body mass index (BMI) 19 or less, adult: Secondary | ICD-10-CM

## 2023-06-30 DIAGNOSIS — I255 Ischemic cardiomyopathy: Secondary | ICD-10-CM | POA: Diagnosis present

## 2023-06-30 DIAGNOSIS — I5020 Unspecified systolic (congestive) heart failure: Secondary | ICD-10-CM | POA: Diagnosis not present

## 2023-06-30 DIAGNOSIS — I081 Rheumatic disorders of both mitral and tricuspid valves: Secondary | ICD-10-CM | POA: Diagnosis present

## 2023-06-30 DIAGNOSIS — I2721 Secondary pulmonary arterial hypertension: Secondary | ICD-10-CM | POA: Diagnosis present

## 2023-06-30 DIAGNOSIS — J90 Pleural effusion, not elsewhere classified: Secondary | ICD-10-CM | POA: Diagnosis present

## 2023-06-30 DIAGNOSIS — T502X5A Adverse effect of carbonic-anhydrase inhibitors, benzothiadiazides and other diuretics, initial encounter: Secondary | ICD-10-CM | POA: Diagnosis present

## 2023-06-30 DIAGNOSIS — R918 Other nonspecific abnormal finding of lung field: Secondary | ICD-10-CM | POA: Diagnosis not present

## 2023-06-30 DIAGNOSIS — Z833 Family history of diabetes mellitus: Secondary | ICD-10-CM

## 2023-06-30 DIAGNOSIS — E785 Hyperlipidemia, unspecified: Secondary | ICD-10-CM | POA: Diagnosis present

## 2023-06-30 DIAGNOSIS — I2510 Atherosclerotic heart disease of native coronary artery without angina pectoris: Secondary | ICD-10-CM | POA: Diagnosis not present

## 2023-06-30 DIAGNOSIS — N182 Chronic kidney disease, stage 2 (mild): Secondary | ICD-10-CM | POA: Diagnosis present

## 2023-06-30 DIAGNOSIS — I5023 Acute on chronic systolic (congestive) heart failure: Secondary | ICD-10-CM | POA: Diagnosis present

## 2023-06-30 DIAGNOSIS — Z841 Family history of disorders of kidney and ureter: Secondary | ICD-10-CM

## 2023-06-30 DIAGNOSIS — I480 Paroxysmal atrial fibrillation: Secondary | ICD-10-CM | POA: Diagnosis not present

## 2023-06-30 DIAGNOSIS — I252 Old myocardial infarction: Secondary | ICD-10-CM

## 2023-06-30 DIAGNOSIS — Z515 Encounter for palliative care: Secondary | ICD-10-CM

## 2023-06-30 DIAGNOSIS — I9589 Other hypotension: Secondary | ICD-10-CM | POA: Diagnosis present

## 2023-06-30 DIAGNOSIS — I5084 End stage heart failure: Secondary | ICD-10-CM | POA: Diagnosis present

## 2023-06-30 DIAGNOSIS — Z95810 Presence of automatic (implantable) cardiac defibrillator: Secondary | ICD-10-CM

## 2023-06-30 DIAGNOSIS — Z7901 Long term (current) use of anticoagulants: Secondary | ICD-10-CM

## 2023-06-30 DIAGNOSIS — Z91014 Allergy to mammalian meats: Secondary | ICD-10-CM

## 2023-06-30 DIAGNOSIS — R0602 Shortness of breath: Secondary | ICD-10-CM | POA: Diagnosis not present

## 2023-06-30 DIAGNOSIS — Z823 Family history of stroke: Secondary | ICD-10-CM

## 2023-06-30 DIAGNOSIS — Z7902 Long term (current) use of antithrombotics/antiplatelets: Secondary | ICD-10-CM

## 2023-06-30 DIAGNOSIS — Z7984 Long term (current) use of oral hypoglycemic drugs: Secondary | ICD-10-CM

## 2023-06-30 HISTORY — PX: RIGHT HEART CATH: CATH118263

## 2023-06-30 LAB — BASIC METABOLIC PANEL WITH GFR
Anion gap: 12 (ref 5–15)
BUN: 28 mg/dL — ABNORMAL HIGH (ref 8–23)
CO2: 26 mmol/L (ref 22–32)
Calcium: 9.3 mg/dL (ref 8.9–10.3)
Chloride: 97 mmol/L — ABNORMAL LOW (ref 98–111)
Creatinine, Ser: 1.48 mg/dL — ABNORMAL HIGH (ref 0.61–1.24)
GFR, Estimated: 52 mL/min — ABNORMAL LOW (ref >60)
Glucose, Bld: 149 mg/dL — ABNORMAL HIGH (ref 70–99)
Potassium: 3.6 mmol/L (ref 3.5–5.1)
Sodium: 135 mmol/L (ref 135–145)

## 2023-06-30 LAB — POCT I-STAT EG7
Acid-Base Excess: 5 mmol/L — ABNORMAL HIGH (ref 0.0–2.0)
Acid-Base Excess: 5 mmol/L — ABNORMAL HIGH (ref 0.0–2.0)
Acid-base deficit: 1 mmol/L (ref 0.0–2.0)
Bicarbonate: 22.3 mmol/L (ref 20.0–28.0)
Bicarbonate: 29.3 mmol/L — ABNORMAL HIGH (ref 20.0–28.0)
Bicarbonate: 29.5 mmol/L — ABNORMAL HIGH (ref 20.0–28.0)
Calcium, Ion: 0.78 mmol/L — CL (ref 1.15–1.40)
Calcium, Ion: 1.14 mmol/L — ABNORMAL LOW (ref 1.15–1.40)
Calcium, Ion: 1.16 mmol/L (ref 1.15–1.40)
HCT: 38 % — ABNORMAL LOW (ref 39.0–52.0)
HCT: 46 % (ref 39.0–52.0)
HCT: 46 % (ref 39.0–52.0)
Hemoglobin: 12.9 g/dL — ABNORMAL LOW (ref 13.0–17.0)
Hemoglobin: 15.6 g/dL (ref 13.0–17.0)
Hemoglobin: 15.6 g/dL (ref 13.0–17.0)
O2 Saturation: 63 %
O2 Saturation: 69 %
O2 Saturation: 72 %
Potassium: 2.9 mmol/L — ABNORMAL LOW (ref 3.5–5.1)
Potassium: 4 mmol/L (ref 3.5–5.1)
Potassium: 4.1 mmol/L (ref 3.5–5.1)
Sodium: 138 mmol/L (ref 135–145)
Sodium: 138 mmol/L (ref 135–145)
Sodium: 147 mmol/L — ABNORMAL HIGH (ref 135–145)
TCO2: 23 mmol/L (ref 22–32)
TCO2: 31 mmol/L (ref 22–32)
TCO2: 31 mmol/L (ref 22–32)
pCO2, Ven: 33.7 mmHg — ABNORMAL LOW (ref 44–60)
pCO2, Ven: 42.4 mmHg — ABNORMAL LOW (ref 44–60)
pCO2, Ven: 42.9 mmHg — ABNORMAL LOW (ref 44–60)
pH, Ven: 7.429 (ref 7.25–7.43)
pH, Ven: 7.444 — ABNORMAL HIGH (ref 7.25–7.43)
pH, Ven: 7.451 — ABNORMAL HIGH (ref 7.25–7.43)
pO2, Ven: 32 mmHg (ref 32–45)
pO2, Ven: 35 mmHg (ref 32–45)
pO2, Ven: 36 mmHg (ref 32–45)

## 2023-06-30 LAB — BRAIN NATRIURETIC PEPTIDE: B Natriuretic Peptide: 1125.8 pg/mL — ABNORMAL HIGH (ref 0.0–100.0)

## 2023-06-30 LAB — CBC
HCT: 47.3 % (ref 39.0–52.0)
Hemoglobin: 14.9 g/dL (ref 13.0–17.0)
MCH: 28.8 pg (ref 26.0–34.0)
MCHC: 31.5 g/dL (ref 30.0–36.0)
MCV: 91.3 fL (ref 80.0–100.0)
Platelets: 274 10*3/uL (ref 150–400)
RBC: 5.18 MIL/uL (ref 4.22–5.81)
RDW: 14.8 % (ref 11.5–15.5)
WBC: 6 10*3/uL (ref 4.0–10.5)
nRBC: 0 % (ref 0.0–0.2)

## 2023-06-30 LAB — TROPONIN I (HIGH SENSITIVITY)
Troponin I (High Sensitivity): 18 ng/L — ABNORMAL HIGH (ref <18)
Troponin I (High Sensitivity): 19 ng/L — ABNORMAL HIGH (ref <18)

## 2023-06-30 LAB — GLUCOSE, CAPILLARY: Glucose-Capillary: 137 mg/dL — ABNORMAL HIGH (ref 70–99)

## 2023-06-30 LAB — DIGOXIN LEVEL: Digoxin Level: 0.4 ng/mL — ABNORMAL LOW (ref 0.8–2.0)

## 2023-06-30 LAB — MAGNESIUM: Magnesium: 2.5 mg/dL — ABNORMAL HIGH (ref 1.7–2.4)

## 2023-06-30 SURGERY — RIGHT HEART CATH
Anesthesia: LOCAL

## 2023-06-30 MED ORDER — EMPAGLIFLOZIN 10 MG PO TABS
10.0000 mg | ORAL_TABLET | Freq: Every day | ORAL | Status: DC
  Administered 2023-07-01 – 2023-07-03 (×3): 10 mg via ORAL
  Filled 2023-06-30 (×3): qty 1

## 2023-06-30 MED ORDER — ROSUVASTATIN CALCIUM 20 MG PO TABS
40.0000 mg | ORAL_TABLET | Freq: Every day | ORAL | Status: DC
  Administered 2023-06-30 – 2023-07-05 (×6): 40 mg via ORAL
  Filled 2023-06-30 (×6): qty 2

## 2023-06-30 MED ORDER — SODIUM CHLORIDE 0.9 % IV SOLN: INTRAVENOUS | Status: DC

## 2023-06-30 MED ORDER — POTASSIUM CHLORIDE CRYS ER 20 MEQ PO TBCR
40.0000 meq | EXTENDED_RELEASE_TABLET | Freq: Once | ORAL | Status: AC
  Administered 2023-06-30: 40 meq via ORAL
  Filled 2023-06-30: qty 2

## 2023-06-30 MED ORDER — FUROSEMIDE 10 MG/ML IJ SOLN
40.0000 mg | Freq: Once | INTRAMUSCULAR | Status: AC
  Administered 2023-06-30: 40 mg via INTRAVENOUS
  Filled 2023-06-30: qty 4

## 2023-06-30 MED ORDER — MIDODRINE HCL 5 MG PO TABS: 2.5000 mg | ORAL_TABLET | Freq: Two times a day (BID) | ORAL | Status: DC

## 2023-06-30 MED ORDER — LINACLOTIDE 145 MCG PO CAPS
145.0000 ug | ORAL_CAPSULE | Freq: Every day | ORAL | Status: DC
  Administered 2023-07-01 – 2023-07-05 (×5): 145 ug via ORAL
  Filled 2023-06-30 (×5): qty 1

## 2023-06-30 MED ORDER — DIGOXIN 125 MCG PO TABS
0.1250 mg | ORAL_TABLET | Freq: Every day | ORAL | Status: DC
  Administered 2023-06-30 – 2023-07-05 (×6): 0.125 mg via ORAL
  Filled 2023-06-30 (×6): qty 1

## 2023-06-30 MED ORDER — HEPARIN (PORCINE) IN NACL 1000-0.9 UT/500ML-% IV SOLN
INTRAVENOUS | Status: DC | PRN
  Administered 2023-06-30: 500 mL

## 2023-06-30 MED ORDER — FUROSEMIDE 10 MG/ML IJ SOLN
80.0000 mg | Freq: Once | INTRAMUSCULAR | Status: AC
  Administered 2023-06-30: 80 mg via INTRAVENOUS
  Filled 2023-06-30: qty 8

## 2023-06-30 MED ORDER — POTASSIUM CHLORIDE CRYS ER 20 MEQ PO TBCR
40.0000 meq | EXTENDED_RELEASE_TABLET | Freq: Every day | ORAL | Status: DC
  Administered 2023-07-01 – 2023-07-05 (×5): 40 meq via ORAL
  Filled 2023-06-30 (×5): qty 2

## 2023-06-30 MED ORDER — LIDOCAINE HCL (PF) 1 % IJ SOLN
INTRAMUSCULAR | Status: AC
  Filled 2023-06-30: qty 30

## 2023-06-30 MED ORDER — AMIODARONE HCL 200 MG PO TABS
200.0000 mg | ORAL_TABLET | Freq: Every day | ORAL | Status: DC
  Administered 2023-06-30 – 2023-07-05 (×6): 200 mg via ORAL
  Filled 2023-06-30 (×6): qty 1

## 2023-06-30 MED ORDER — PANTOPRAZOLE SODIUM 40 MG PO TBEC
40.0000 mg | DELAYED_RELEASE_TABLET | Freq: Two times a day (BID) | ORAL | Status: DC
  Administered 2023-07-01 – 2023-07-05 (×9): 40 mg via ORAL
  Filled 2023-06-30 (×9): qty 1

## 2023-06-30 MED ORDER — LIDOCAINE HCL (PF) 1 % IJ SOLN
INTRAMUSCULAR | Status: DC | PRN
Start: 2023-06-30 — End: 2023-06-30
  Administered 2023-06-30: 2 mL

## 2023-06-30 MED ORDER — EZETIMIBE 10 MG PO TABS
10.0000 mg | ORAL_TABLET | Freq: Every day | ORAL | Status: DC
  Administered 2023-06-30 – 2023-07-05 (×6): 10 mg via ORAL
  Filled 2023-06-30 (×7): qty 1

## 2023-06-30 MED ORDER — CLOPIDOGREL BISULFATE 75 MG PO TABS
75.0000 mg | ORAL_TABLET | Freq: Every day | ORAL | Status: DC
  Administered 2023-07-01 – 2023-07-05 (×5): 75 mg via ORAL
  Filled 2023-06-30 (×6): qty 1

## 2023-06-30 MED ORDER — APIXABAN 5 MG PO TABS
5.0000 mg | ORAL_TABLET | Freq: Two times a day (BID) | ORAL | Status: DC
  Administered 2023-06-30 – 2023-07-05 (×10): 5 mg via ORAL
  Filled 2023-06-30 (×11): qty 1

## 2023-06-30 MED ORDER — LEVOTHYROXINE SODIUM 75 MCG PO TABS
75.0000 ug | ORAL_TABLET | Freq: Every day | ORAL | Status: DC
  Administered 2023-07-01 – 2023-07-05 (×5): 75 ug via ORAL
  Filled 2023-06-30 (×5): qty 1

## 2023-06-30 MED ORDER — SODIUM CHLORIDE 0.9% FLUSH
3.0000 mL | Freq: Two times a day (BID) | INTRAVENOUS | Status: DC
  Administered 2023-06-30 – 2023-07-04 (×9): 3 mL via INTRAVENOUS

## 2023-06-30 MED ORDER — MIDODRINE HCL 5 MG PO TABS
2.5000 mg | ORAL_TABLET | Freq: Two times a day (BID) | ORAL | Status: DC
  Administered 2023-06-30 – 2023-07-01 (×2): 2.5 mg via ORAL
  Filled 2023-06-30 (×2): qty 1

## 2023-06-30 MED ORDER — MELATONIN 5 MG PO TABS
5.0000 mg | ORAL_TABLET | Freq: Every evening | ORAL | Status: DC | PRN
  Administered 2023-06-30: 5 mg via ORAL
  Filled 2023-06-30: qty 1

## 2023-06-30 SURGICAL SUPPLY — 5 items
CATH SWAN GANZ 7F STRAIGHT (CATHETERS) IMPLANT
GLIDESHEATH SLENDER 7FR .021G (SHEATH) IMPLANT
PACK CARDIAC CATHETERIZATION (CUSTOM PROCEDURE TRAY) ×2 IMPLANT
TRANSDUCER W/MONITORING KIT (MISCELLANEOUS) IMPLANT
TUBING ART PRESS 72 MALE/FEM (TUBING) IMPLANT

## 2023-06-30 NOTE — ED Notes (Signed)
 Lab called to add magnesium on to the last troponin.

## 2023-06-30 NOTE — ED Notes (Signed)
 Pt transported to cath lab.

## 2023-06-30 NOTE — ED Provider Notes (Addendum)
 [place] EMERGENCY DEPARTMENT AT [facility] Provider Note   CSN: [id number] Arrival date & time: 06/30/23  4098     Patient presents with: Shortness of Breath  HPI Garrett Roberts is a 68 y.o. male with CHF, CAD, atrial fibrillation on Eliquis , diabetes, hypertension presenting for shortness of breath.  He states his symptoms started 3 days ago.  Worse with exertion and lying flat.  Feels he is accumulating fluid in both of his feet.  Also endorses chest pressure that started 3 days ago as well.  The chest pressure is in the center of his chest and nonradiating otherwise.  States he is taking his furosemide  as prescribed.  Did have a ICD placed in May for his CHF.    Shortness of Breath      Prior to Admission medications   Medication Sig Start Date End Date Taking? Authorizing Provider  acetaminophen  (TYLENOL ) 325 MG tablet Take 1-2 tablets (325-650 mg total) by mouth every 4 (four) hours as needed for mild pain (pain score 1-3). 06/02/23   Tylene Galla, PA-C  amiodarone  (PACERONE ) 200 MG tablet Take 1 tablet (200 mg total) by mouth daily. 03/25/23   Ruddy Corral M, PA-C  apixaban  (ELIQUIS ) 5 MG TABS tablet Take 1 tablet (5 mg total) by mouth 2 (two) times daily. 01/16/23   Gerald Kitty., NP  Ascorbic Acid (VITAMIN C PO) Take 1 tablet by mouth daily with lunch.    [provider]  clopidogrel  (PLAVIX ) 75 MG tablet Take 1 tablet (75 mg total) by mouth daily. 12/27/22   Arleene Belt, PA-C  Cyanocobalamin (B-12 PO) Take 1 tablet by mouth daily with lunch.    [provider]  digoxin  (LANOXIN ) 0.125 MG tablet Take 1 tablet (0.125 mg total) by mouth daily. 03/30/23   Darlis Eisenmenger, MD  empagliflozin  (JARDIANCE ) 10 MG TABS tablet Take 1 tablet (10 mg total) by mouth daily before breakfast. 01/16/23   Gerald Kitty., NP  ezetimibe  (ZETIA ) 10 MG tablet Take 1 tablet (10 mg total) by mouth daily. 04/07/23   Darlis Eisenmenger, MD   levothyroxine  (SYNTHROID ) 75 MCG tablet Take 1 tablet (75 mcg total) by mouth daily. 06/12/23   Jolanda Nation, NP  linaclotide  (LINZESS ) 145 MCG CAPS capsule Take 1 capsule (145 mcg total) by mouth daily before breakfast. 06/16/23 06/10/24  Collier, Amanda R, PA-C  MAGNESIUM PO Take 1 tablet by mouth at bedtime.    [provider]  midodrine  (PROAMATINE ) 2.5 MG tablet Take 1 tablet (2.5 mg total) by mouth 3 (three) times daily with meals. 06/13/23   Milford, Arlice Bene, FNP  mirtazapine  (REMERON ) 15 MG tablet Take 1 tablet (15 mg total) by mouth at bedtime. 04/14/23   Zehr, Jessica D, PA-C  ondansetron  (ZOFRAN -ODT) 4 MG disintegrating tablet Take 1 tablet (4 mg total) by mouth every 8 (eight) hours as needed. 04/05/23   Jolanda Nation, NP  pantoprazole  (PROTONIX ) 40 MG tablet Take 1 tablet (40 mg total) by mouth 2 (two) times daily before a meal. 02/06/23   Darlis Eisenmenger, MD  potassium chloride  SA (KLOR-CON  M) 20 MEQ tablet Take 2 tablets (40 mEq total) by mouth daily. 05/23/23   Milford, Arlice Bene, FNP  rosuvastatin  (CRESTOR ) 40 MG tablet Take 40 mg by mouth daily.    [provider]  tadalafil  (CIALIS ) 20 MG tablet Take 1 tablet (20 mg total) by mouth daily. 05/23/23   Elmarie Hacking, FNP  torsemide  (  DEMADEX ) 20 MG tablet Take 2 tablets (40 mg total) by mouth 2 (two) times daily. 05/23/23   Milford, Arlice Bene, FNP  Vitamin D-Vitamin K (VITAMIN K2-VITAMIN D3 PO) Take 1 tablet by mouth daily with lunch.    [provider]  zinc gluconate 50 MG tablet Take 50 mg by mouth daily with lunch.    [provider]    Allergies: Beef-derived drug products and Pork-derived products    Review of Systems  Respiratory:  Positive for shortness of breath.     Updated Vital Signs BP 90/67   Pulse 69   Temp (!) 97.5 F (36.4 C) (Oral)   Resp 19   Ht 6' (1.829 m)   Wt 68 kg   SpO2 98%   BMI 20.34 kg/m   Physical Exam Vitals and nursing note reviewed.  HENT:     Head:  Normocephalic and atraumatic.     Mouth/Throat:     Mouth: Mucous membranes are moist.   Eyes:     General:        Right eye: No discharge.        Left eye: No discharge.     Conjunctiva/sclera: Conjunctivae normal.    Cardiovascular:     Rate and Rhythm: Normal rate and regular rhythm.     Pulses: Normal pulses.     Heart sounds: Normal heart sounds.  Pulmonary:     Effort: Pulmonary effort is normal. Tachypnea present.     Breath sounds: Normal breath sounds and air entry.  Abdominal:     General: Abdomen is flat.     Palpations: Abdomen is soft.   Skin:    General: Skin is warm and dry.   Neurological:     General: No focal deficit present.   Psychiatric:        Mood and Affect: Mood normal.     (all labs ordered are listed, but only abnormal results are displayed) Labs Reviewed  BASIC METABOLIC PANEL WITH GFR - Abnormal; Notable for the following components:      Result Value   Chloride 97 (*)    Glucose, Bld 149 (*)    BUN 28 (*)    Creatinine, Ser 1.48 (*)    GFR, Estimated 52 (*)    All other components within normal limits  BRAIN NATRIURETIC PEPTIDE - Abnormal; Notable for the following components:   B Natriuretic Peptide 1,125.8 (*)    All other components within normal limits  MAGNESIUM - Abnormal; Notable for the following components:   Magnesium 2.5 (*)    All other components within normal limits  TROPONIN I (HIGH SENSITIVITY) - Abnormal; Notable for the following components:   Troponin I (High Sensitivity) 18 (*)    All other components within normal limits  TROPONIN I (HIGH SENSITIVITY) - Abnormal; Notable for the following components:   Troponin I (High Sensitivity) 19 (*)    All other components within normal limits  CBC    EKG: EKG Interpretation Date/Time:  Friday June 30 2023 00:48:53 EDT Ventricular Rate:  80 PR Interval:  204 QRS Duration:  174 QT Interval:  416 QTC Calculation: 479 R Axis:   214  Text Interpretation: Normal  sinus rhythm Right bundle branch block Anteroseptal infarct , age undetermined Abnormal ECG When compared with ECG of 13-Jun-2023 09:41, PREVIOUS ECG IS PRESENT Confirmed by Russella Courts ([id number]) on 06/30/2023 8:26:02 AM  Radiology: DG Chest 2 View Result Date: 06/30/2023 EXAM: 2 VIEW(S) XRAY OF THE CHEST  06/30/2023 01:04:00 AM COMPARISON: 06/02/2023 CLINICAL HISTORY: shob. Encounter for shortness of breath FINDINGS: LUNGS AND PLEURA: Small right pleural effusion. Associated right lower lobe opacity, likely atelectasis. Mild right perihilar edema. HEART AND MEDIASTINUM: No acute abnormality of the cardiac and mediastinal silhouettes. BONES AND SOFT TISSUES: No acute osseous abnormality. Left subclavian ICD in appropriate position. IMPRESSION: 1. Mild right perihilar edema. 2. Small right pleural effusion. Associated right lower lobe opacity, likely atelectasis. Electronically signed by: Zadie Herter MD 06/30/2023 01:07 AM EDT RP Workstation: [id number]     Procedures   Medications Ordered in the ED  potassium chloride  SA (KLOR-CON  M) CR tablet 40 mEq (40 mEq Oral Given 06/30/23 0722)  furosemide  (LASIX ) injection 40 mg (40 mg Intravenous Given 06/30/23 0724)                                    Medical Decision Making Amount and/or Complexity of Data Reviewed Labs: ordered. Radiology: ordered.  Risk Prescription drug management. Decision regarding hospitalization.   Initial Impression and Ddx 68 year old well-appearing male presenting for shortness of breath.  Exam notable for diminished breath sounds in the bases.  DDx includes CHF exacerbation, ACS, arrhythmia, PE, COPD exacerbation, pneumonia, other. Patient PMH that increases complexity of ED encounter:   CHF, CAD, atrial fibrillation on Eliquis , diabetes, hypertension  Interpretation of Diagnostics - I independent reviewed and interpreted the labs as followed: Elevated BNP  - I independently visualized the following imaging with  scope of interpretation limited to determining acute life threatening conditions related to emergency care: CXR, which revealed perihilar edema and small right-sided pleural effusion  -I personally reviewed and interpreted EKG which revealed NSR  Patient Reassessment and Ultimate Disposition/Management On reassessment, attempted to ambulate him.  Was notably tachypneic and short of breath while walking to the bathroom.  Per chart review, EF is 25 to 30% on last echo.  Feel that he would benefit for further IV diuresis and observation here in the hospital.  Admitted to hospital service with Dr. Manny Sees.  Patient management required discussion with the following services or consulting groups:  Hospitalist Service  Complexity of Problems Addressed Acute complicated illness or Injury  Additional Data Reviewed and Analyzed Further history obtained from: Past medical history and medications listed in the EMR and Prior ED visit notes  Patient Encounter Risk Assessment Consideration of hospitalization      Final diagnoses:  SOB (shortness of breath)    ED Discharge Orders     None          Janalee Mcmurray, PA-C 06/30/23 0932    Janalee Mcmurray, PA-C 06/30/23 1107    Earma Gloss, MD 06/30/23 1600

## 2023-06-30 NOTE — Consult Note (Addendum)
 Advanced Heart Failure Team Consult Note   Primary Physician: Garrett Nation, NP Cardiologist:  Garrett Das, MD HF Cardiologist: Dr. Mitzie Roberts  Reason for Consultation: Acute on chronic systolic CHF   HPI:    Garrett Roberts is seen today for evaluation of acute on chronic systolic CHF at the request of Dr. Felipe Roberts with [facility].   68 y.o. with history of CAD, PAF, DM II, HTN, HLD, ischemic cardiomyopathy/chronic systolic CHF.    Patient had inferior STEMI 10/23 s/p PCI/DES to RCA. Had residual diffuse disease in diagonals and OMs. EF 40-45% at time of cath. Subsequent echo in 10/23 with EF 55-60%.    He was not taking medications in 2024 after losing insurance. Presented with anterior STEMI 12/10/22. Cardiac cath with 100% proximal LAD treated with PCI/DES. Prior RCA stent patent, severe disease in tortuous ramus and distal LCx treated medically.  Echo with EF 25%, no LV thrombus, AK mid septum to apex, moderately reduced RV systolic function. Course complicated by atrial fibrillation with RVR. Started on IV amiodarone  and underwent DCCV to SR.  GDMT limited by soft blood pressure. Also seen by GI for abdominal pain. Had thickening of stomach on CT scan. H. Pylori later resulted positive after discharge, seen by GI and started on treatment for H pylori-related gastritis.    Returned to ED 12/19/22 with recurrent atrial fibrillation. He converted to SR while in waiting room and was discharged home.   Admitted 2/25 with acute CHF. Diuresed with IV lasix . GDMT cut back 2/2 to low BP.  Echo in 2/25 showed EF 25% with mild RV dysfunction and mild RV enlargement, PASP 60 mmHg, moderate MR.    He was seen again in the ER 03/14/23 with worsening fatigue and dyspnea.  Torsemide  was increased to 40 qam/20 qpm.    Patient was set up for RHC in 3/25 due to ongoing severe fatigue. This showed low CI 2.05 by thermodilution as well as moderate pulmonary arterial hypertension, he was admitted and  started on milrinone  gtt.  He was gradually titrated off milrinone  and maintained a good co-ox off milrinone .  He had a moderate right pleural effusion, underwent right thoracentesis.  Fluid was transudative but grew E coli, he was treated with Bactrim  based on sensitivities.  Given early satiety and poor appetite, he had an EGD that showed gastritic, biopsies showed no H pylori or gastric cancer.  HRCT chest showed bibasilar bronchiectasis, ?aspiration. However, swallow study was not suggestive of aspiration. V/Q scan was done due to pulmonary hypertension, this showed no chronic PE.  He was started on sildenafil  and meds were titrated, he felt better when he went home.    Echo 3/25 showed EF 25-30%, mild RV dysfunction, moderate MR, PASP 62 mmHg.    S/p Biotronik ICD 5/25.  Reports he's been struggling with fluid retention and poor energy for over a month. Notes shortness of breath just walking from front door to the car, persistent lower extremity edema and orthopnea. Took extra po diuretic at home without improvement. He presented to the ED today for further evaluation.  Home Medications Prior to Admission medications   Medication Sig Start Date End Date Taking? Authorizing Provider  acetaminophen  (TYLENOL ) 325 MG tablet Take 1-2 tablets (325-650 mg total) by mouth every 4 (four) hours as needed for mild pain (pain score 1-3). 06/02/23   Garrett Galla, PA-C  amiodarone  (PACERONE ) 200 MG tablet Take 1 tablet (200 mg total) by mouth daily. 03/25/23   Garrett Roberts,  Garrett M, PA-C  apixaban  (ELIQUIS ) 5 MG TABS tablet Take 1 tablet (5 mg total) by mouth 2 (two) times daily. 01/16/23   Garrett Kitty., NP  Ascorbic Acid (VITAMIN C PO) Take 1 tablet by mouth daily with lunch.    [provider]  clopidogrel  (PLAVIX ) 75 MG tablet Take 1 tablet (75 mg total) by mouth daily. 12/27/22   Arleene Belt, PA-C  Cyanocobalamin (B-12 PO) Take 1 tablet by mouth daily with lunch.     [provider]  digoxin  (LANOXIN ) 0.125 MG tablet Take 1 tablet (0.125 mg total) by mouth daily. 03/30/23   Garrett Eisenmenger, MD  empagliflozin  (JARDIANCE ) 10 MG TABS tablet Take 1 tablet (10 mg total) by mouth daily before breakfast. 01/16/23   Garrett Kitty., NP  ezetimibe  (ZETIA ) 10 MG tablet Take 1 tablet (10 mg total) by mouth daily. 04/07/23   Garrett Eisenmenger, MD  levothyroxine  (SYNTHROID ) 75 MCG tablet Take 1 tablet (75 mcg total) by mouth daily. 06/12/23   Garrett Nation, NP  linaclotide  (LINZESS ) 145 MCG CAPS capsule Take 1 capsule (145 mcg total) by mouth daily before breakfast. 06/16/23 06/10/24  Garrett Roberts, Garrett R, PA-C  MAGNESIUM PO Take 1 tablet by mouth at bedtime.    [provider]  midodrine  (PROAMATINE ) 2.5 MG tablet Take 1 tablet (2.5 mg total) by mouth 3 (three) times daily with meals. 06/13/23   Garrett Roberts, Garrett Bene, FNP  mirtazapine  (REMERON ) 15 MG tablet Take 1 tablet (15 mg total) by mouth at bedtime. 04/14/23   Garrett Roberts, Garrett D, PA-C  ondansetron  (ZOFRAN -ODT) 4 MG disintegrating tablet Take 1 tablet (4 mg total) by mouth every 8 (eight) hours as needed. 04/05/23   Garrett Nation, NP  pantoprazole  (PROTONIX ) 40 MG tablet Take 1 tablet (40 mg total) by mouth 2 (two) times daily before a meal. 02/06/23   Garrett Eisenmenger, MD  potassium chloride  SA (KLOR-CON  Roberts) 20 MEQ tablet Take 2 tablets (40 mEq total) by mouth daily. 05/23/23   Garrett Hacking, FNP  rosuvastatin  (CRESTOR ) 40 MG tablet Take 40 mg by mouth daily.    [provider]  tadalafil  (CIALIS ) 20 MG tablet Take 1 tablet (20 mg total) by mouth daily. 05/23/23   Garrett Roberts, Garrett Bene, FNP  torsemide  (DEMADEX ) 20 MG tablet Take 2 tablets (40 mg total) by mouth 2 (two) times daily. 05/23/23   Garrett Roberts, Garrett Bene, FNP  Vitamin Roberts-Vitamin K (VITAMIN K2-VITAMIN D3 PO) Take 1 tablet by mouth daily with lunch.    [provider]  zinc gluconate 50 MG tablet Take 50 mg by mouth daily with lunch.    [provider]    Past Medical History: Past Medical History:  Diagnosis Date   Acute ST elevation myocardial infarction (STEMI) of anterior wall (HCC) 12/10/2022   Acute ST elevation myocardial infarction (STEMI) of inferior wall (HCC) 10/30/2021   Atrial fibrillation (HCC)    Atrial fibrillation with RVR (HCC) 12/19/2022   CHF (congestive heart failure) (HCC)    Coronary artery disease    Diabetes mellitus without complication (HCC)    DM (diabetes mellitus) (HCC)    H. pylori infection    Heart attack (HCC)    HLD (hyperlipidemia)    Hypertension    ST elevation myocardial infarction involving left anterior descending (LAD) coronary artery (HCC) 12/10/2022    Past Surgical History: Past Surgical History:  Procedure Laterality Date   CARDIOVERSION N/A 12/14/2022   Procedure: CARDIOVERSION;  Surgeon: Garrett Hamburg, MD;  Location: [facility] INVASIVE CV LAB;  Service: Cardiovascular;  Laterality: N/A;   CORONARY/GRAFT ACUTE MI REVASCULARIZATION N/A 10/30/2021   Procedure: Coronary/Graft Acute MI Revascularization;  Surgeon: Garrett Perl, MD;  Location: [facility] INVASIVE CV LAB;  Service: Cardiovascular;  Laterality: N/A;   CORONARY/GRAFT ACUTE MI REVASCULARIZATION N/A 12/10/2022   Procedure: Coronary/Graft Acute MI Revascularization;  Surgeon: Garrett Lapping, MD;  Location: [facility] INVASIVE CV LAB;  Service: Cardiovascular;  Laterality: N/A;   ESOPHAGOGASTRODUODENOSCOPY N/A 03/23/2023   Procedure: EGD (ESOPHAGOGASTRODUODENOSCOPY);  Surgeon: Garrett Hair, MD;  Location: [facility] ENDOSCOPY;  Service: Gastroenterology;  Laterality: N/A;   ICD IMPLANT N/A 06/01/2023   Procedure: ICD IMPLANT;  Surgeon: Garrett Kohler, MD;  Location: [facility] INVASIVE CV LAB;  Service: Cardiovascular;  Laterality: N/A;   LEFT HEART CATH AND CORONARY ANGIOGRAPHY N/A 10/30/2021   Procedure: LEFT HEART CATH AND CORONARY ANGIOGRAPHY;  Surgeon: Garrett Perl, MD;  Location: [facility] INVASIVE CV LAB;  Service: Cardiovascular;   Laterality: N/A;   LEFT HEART CATH AND CORONARY ANGIOGRAPHY N/A 12/10/2022   Procedure: LEFT HEART CATH AND CORONARY ANGIOGRAPHY;  Surgeon: Garrett Lapping, MD;  Location: [facility] INVASIVE CV LAB;  Service: Cardiovascular;  Laterality: N/A;   RIGHT HEART CATH N/A 03/20/2023   Procedure: RIGHT HEART CATH;  Surgeon: Garrett Eisenmenger, MD;  Location: [facility] INVASIVE CV LAB;  Service: Cardiovascular;  Laterality: N/A;    Family History: Family History  Problem Relation Age of Onset   Diabetes Mother    Chronic Renal Failure Father    Diabetes Father    Chronic Renal Failure Brother    Diabetes Brother    Diabetes Brother    Heart attack Brother    Stroke Brother     Social History: Social History   Socioeconomic History   Marital status: Married    Spouse name: Amani   Number of children: 5   Years of education: Not on file   Highest education level: High school graduate  Occupational History   Occupation: part time  Tobacco Use   Smoking status: Never   Smokeless tobacco: Never  Vaping Use   Vaping status: Never Used  Substance and Sexual Activity   Alcohol use: No   Drug use: No   Sexual activity: Not on file  Other Topics Concern   Not on file  Social History Narrative   ** Merged History Encounter **       Social Drivers of Health   Financial Resource Strain: High Risk (12/13/2022)   Overall Financial Resource Strain (CARDIA)    Difficulty of Paying Living Expenses: Very hard  Food Insecurity: No Food Insecurity (06/01/2023)   Hunger Vital Sign    Worried About Running Out of Food in the Last Year: Never true    Ran Out of Food in the Last Year: Never true  Transportation Needs: No Transportation Needs (06/01/2023)   PRAPARE - Administrator, Civil Service (Medical): No    Lack of Transportation (Non-Medical): No  Physical Activity: Sufficiently Active (11/27/2019)   Received from [facility]   Exercise Vital Sign    Days of Exercise per Week: 4 days     Minutes of Exercise per Session: 60 min  Stress: Stress Concern Present (11/27/2019)   Received from [facility] - Occupational Stress Questionnaire    Feeling of Stress : To some extent  Social Connections: Socially Integrated (06/01/2023)   Social Connection and Isolation Panel  Frequency of Communication with Friends and Family: More than three times a week    Frequency of Social Gatherings with Friends and Family: More than three times a week    Attends Religious Services: More than 4 times per year    Active Member of [employer] or Organizations: Yes    Attends Engineer, structural: More than 4 times per year    Marital Status: Married    Allergies:  Allergies  Allergen Reactions   Beef-Derived Drug Products Other (See Comments)    Religious observance   Pork-Derived Products Other (See Comments)    Religious observance    Objective:    Vital Signs:   Temp:  [97.5 F (36.4 C)-98.2 F (36.8 C)] 97.5 F (36.4 C) (06/20 0628) Pulse Rate:  [64-80] 69 (06/20 0915) Resp:  [0-25] 19 (06/20 0915) BP: (90-104)/(67-85) 90/67 (06/20 0915) SpO2:  [97 %-100 %] 98 % (06/20 0915) Weight:  [68 kg] 68 kg (06/20 0046)    Weight change: Filed Weights   06/30/23 0046  Weight: 68 kg    Intake/Output:  No intake or output data in the 24 hours ending 06/30/23 0934    Physical Exam    General:  Thin, chronically ill appearing Neck: JVP 7-8 cm Cor: Regular rate & rhythm. No rubs, gallops or murmurs. Lungs: clear Abdomen: soft, nontender, nondistended.  Extremities: trace edema, legs are cool Neuro: alert & orientedx3. Affect pleasant   Telemetry   SR 60s   Labs   Basic Metabolic Panel: Recent Labs  Lab 06/30/23 0054 06/30/23 0619  NA 135  --   K 3.6  --   CL 97*  --   CO2 26  --   GLUCOSE 149*  --   BUN 28*  --   CREATININE 1.48*  --   CALCIUM  9.3  --   MG  --  2.5*    Liver Function Tests: No results for  input(s): AST, ALT, ALKPHOS, BILITOT, PROT, ALBUMIN  in the last 168 hours. No results for input(s): LIPASE, AMYLASE in the last 168 hours. No results for input(s): AMMONIA in the last 168 hours.  CBC: Recent Labs  Lab 06/30/23 0054  WBC 6.0  HGB 14.9  HCT 47.3  MCV 91.3  PLT 274    Cardiac Enzymes: No results for input(s): CKTOTAL, CKMB, CKMBINDEX, TROPONINI in the last 168 hours.  BNP: BNP (last 3 results) Recent Labs    05/23/23 0936 06/13/23 0936 06/30/23 0054  BNP 1,046.4* 1,435.9* 1,125.8*    ProBNP (last 3 results) No results for input(s): PROBNP in the last 8760 hours.   CBG: No results for input(s): GLUCAP in the last 168 hours.  Coagulation Studies: No results for input(s): LABPROT, INR in the last 72 hours.   Imaging   DG Chest 2 View Result Date: 06/30/2023 EXAM: 2 VIEW(S) XRAY OF THE CHEST 06/30/2023 01:04:00 AM COMPARISON: 06/02/2023 CLINICAL HISTORY: shob. Encounter for shortness of breath FINDINGS: LUNGS AND PLEURA: Small right pleural effusion. Associated right lower lobe opacity, likely atelectasis. Mild right perihilar edema. HEART AND MEDIASTINUM: No acute abnormality of the cardiac and mediastinal silhouettes. BONES AND SOFT TISSUES: No acute osseous abnormality. Left subclavian ICD in appropriate position. IMPRESSION: 1. Mild right perihilar edema. 2. Small right pleural effusion. Associated right lower lobe opacity, likely atelectasis. Electronically signed by: Zadie Herter MD 06/30/2023 01:07 AM EDT RP Workstation: [id number]     Medications:     Current Medications:   Infusions:     Patient  Profile   27 y.op. male with history of chronic systolic CHF/ICM, CAD, DM II, HTN, PAF. Presenting with acute on chronic CHF  Assessment/Plan   1. Acute on chronic systolic CHF/ischemic cardiomyopathy:  Echo 12/24 with EF 20-25%, no LV thrombus, AK mid septum to apex, moderately reduced RV systolic  function. Echo 2/25 with EF 25%, mildly reduced RV function.  Low BP has limited GDMT titration.  His profound fatigue has been concerning for low output HF. RHC 3/25 showed low CI by thermo (2.05) but preserved by Fick.  Borderline elevated PCWP, normal RA pressure.  There was also moderate mixed pulmonary arterial/pulmonary venous hypertension.  With his significant symptoms, he was started on milrinone  0.25 and added sildenafil  to try to lower PA pressure.  Milrinone  was later weaned off with medication adjustment, and co-ox remained excellent.  Repeat echo 3/25 showed EF 25-30%, mild RV dysfunction, moderate MR, PASP 62 mmHg. Dr. Mitzie Roberts discussed LVAD during last admission, need to follow him very closely.  - Presenting with worsening dyspnea and fatigue, NYHA IIIb - Volume mildly up. Received 40 mg lasix  IV this am, will give another 40 mg now.  - Concern that his profound fatigue, functional limitation and weight loss are Roberts/t low-output HF. Will arrange RHC for this afternoon.  - Off all GDMT except jardiance  and digoxin  Roberts/t hypotension - s/p Biotronik ICD 5/25.  2. CAD:  Hx inferior STEMI 10/23 s/p PCI/DES to RCA. Anterior STEMI 12/10/22, cath showed occluded pLAD treated with PCI/DES, prior RCA stent patent, severe disease OM1 and distal LCx treated medically. No chest pain.  - Continue Plavix .   - No ASA given Eliquis  use.  - Continue rosuvastatin  40 mg + Zetia  10 mg daily.  3. Atrial fibrillation: Paroxysmal. NSR on tele. - Continue Eliquis  5 mg bid. No bleeding issues. - Continue amiodarone  200 mg daily, amiodarone  labs are UTD. Levothyroxine  recently increased to 75 mg daily Roberts/t elevated TSH. He will need a regular eye exam.   4. DM II: Per PCP. Most recent A1C 7.0 - No change.  5. 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 Not a smoker, no known lung disease. CXR has shown some chronic interstitial changes. HRCT chest with bibasilar  bronchiectasis that may be due to aspiration (though swallow study negative).  V/Q scan showed no chronic PE. Rheumatologic serologies negative - Hold tadalafil  with hypotension.  6. Pleural effusion: S/p right thoracentesis 3/25. Pleural fluid was transudative by Light's criteria, no malignant cells. However, it grew E coli.  - Recent CXR (06/02/23) shows small to moderate right pleural effusion. May need to revisit thoracentesis vs push diuretics if renal function allows.  7. GI: Has had significant weight loss, early satiety, GI discomfort.  CT abdomen/pelvis with gastric wall thickening. GI has seen and EGD was performed, no definite evidence for malignancy, biopsies taken for H pylori testing and pathology => negative for H pylori and no gastric cancer.  - Heart failure may play a significant role in the symptoms.  - Continue PPI - Continue outpatient GI follow up  Length of Stay: 0  FINCH, LINDSAY N, PA-C  06/30/2023, 9:34 AM    Advanced Heart Failure Team Pager [phone] (Roberts-F; 7a - 5p)  Please contact [facility] for night-coverage after hours (4p -7a ) and weekends on [email or web address]   Patient seen and examined with the above-signed Advanced Practice Provider and/or Housestaff. I personally reviewed laboratory data, imaging studies and relevant notes. I independently examined the patient and formulated  the important aspects of the plan. I have edited the note to reflect any of my changes or salient points. I have personally discussed the plan with the patient and/or family.  68 y/o male with severe systolic HF due to iCM, PAF and DM2. Multiple admits for HF.   Now presents with 1 month h/o progressive HF symptoms NYHA IIIB-IB with orthopnea and PND not responding to increasing po diuretics.   Situation c/b 35 pound weight loss over past few months  General:  Cachetic weak appearing. No resp difficulty HEENT: normal Neck: supple. JVP 7-8 Carotids 2+ bilat; appreciated. Cor:   Regular rate & rhythm. No rubs, gallops or murmurs. Lungs: clear Abdomen: soft, nontender, nondistended. No hepatosplenomegaly. No bruits or masses. Good bowel sounds. Extremities: no cyanosis, clubbing, rash, edema cool Neuro: alert & orientedx3, cranial nerves grossly intact. moves all 4 extremities w/o difficulty. Affect pleasant  Suspect low output HF with only mild volume overload. Will plan RHC to further assess for advanced therapeis but given weight loss may not be ideal VAD candidate.   Agrees to proceed with cath.  Jules Oar, MD  2:01 PM

## 2023-06-30 NOTE — ED Notes (Signed)
 Heart healthy tray ordered for the patient.

## 2023-06-30 NOTE — ED Notes (Signed)
 CCMD called to place patient on monitor

## 2023-06-30 NOTE — ED Notes (Signed)
 Pt did well ambulating around nurses station no complaints od SOB o2 100 hr 70 to 90

## 2023-06-30 NOTE — ED Notes (Signed)
 Per Service Response Team, the tray should arrive at 2pm.

## 2023-06-30 NOTE — Progress Notes (Signed)
 Heart Failure Navigator Progress Note  Assessed for Heart & Vascular TOC clinic readiness.  Patient does not meet criteria due to Advanced Heart Failure Team patient of Dr. Shirlee Latch.   Navigator will sign off at this time.    Rhae Hammock, BSN, Scientist, clinical (histocompatibility and immunogenetics) Only

## 2023-06-30 NOTE — ED Triage Notes (Signed)
 Pt sts shortness of breath and chest pressure x 3 days.   Says he is compliant on Furosemide .   Recent ICD placed on 5/22 for CHF.

## 2023-06-30 NOTE — ED Notes (Signed)
Patient placed on [facility] per MD

## 2023-06-30 NOTE — H&P (Signed)
 History and Physical    Patient: Garrett Roberts ZOX:[id number] DOB: 1955/01/27 DOA: 06/30/2023 DOS: the patient was seen and examined on 06/30/2023 PCP: Jolanda Nation, NP  Patient coming from: Home  Chief Complaint:  Chief Complaint  Patient presents with   Shortness of Breath   HPI: Garrett Roberts is a 68 y.o. male with medical history significant of h/o PMH of CAD s/p inferior STEMI 10/2021 treated with DES to RCA and recent anterior STEMI on 12/10/2022 s/p DES to LAD, chronic systolic heart failure, paroxysmal AF, DM type II, ICM, CKD stage II, HTN, HLD presents with complaints of shortness of breath and chest pain.  He has been experiencing shortness of breath and chest pain for the past three days, with associated swelling in his feet, particularly at the top. He experiences increased shortness of breath when lying flat.  He has been adherent to his medication regimen, even increasing the doses, but this did not alleviate his symptoms.  No significant weight changes have been noted.  In the emergency department patient was noted to be afebrile with mild tachypnea with ambulation, and all other vital signs relatively maintained.  Labs significant for BUN 28, creatinine 148.  Labs significant for BNP 1125.8, high-sensitivity troponin 18-> 19, BUN 28, and creatinine 1.48.  Chest x-ray noted mild right perihilar edema, small right pleural effusion, and associated right lower lobe opacity thought to be atelectasis.  Patient had been given Lasix  40 mg IV x 2 doses and 80 meq of potassium chloride .  Cardiology had been consulted and plan to take the patient for right heart cath.  Review of Systems: As mentioned in the history of present illness. All other systems reviewed and are negative. Past Medical History:  Diagnosis Date   Acute ST elevation myocardial infarction (STEMI) of anterior wall (HCC) 12/10/2022   Acute ST elevation myocardial infarction (STEMI) of inferior wall  (HCC) 10/30/2021   Atrial fibrillation (HCC)    Atrial fibrillation with RVR (HCC) 12/19/2022   CHF (congestive heart failure) (HCC)    Coronary artery disease    Diabetes mellitus without complication (HCC)    DM (diabetes mellitus) (HCC)    H. pylori infection    Heart attack (HCC)    HLD (hyperlipidemia)    Hypertension    ST elevation myocardial infarction involving left anterior descending (LAD) coronary artery (HCC) 12/10/2022   Past Surgical History:  Procedure Laterality Date   CARDIOVERSION N/A 12/14/2022   Procedure: CARDIOVERSION;  Surgeon: Wendie Hamburg, MD;  Location: [facility] INVASIVE CV LAB;  Service: Cardiovascular;  Laterality: N/A;   CORONARY/GRAFT ACUTE MI REVASCULARIZATION N/A 10/30/2021   Procedure: Coronary/Graft Acute MI Revascularization;  Surgeon: Knox Perl, MD;  Location: [facility] INVASIVE CV LAB;  Service: Cardiovascular;  Laterality: N/A;   CORONARY/GRAFT ACUTE MI REVASCULARIZATION N/A 12/10/2022   Procedure: Coronary/Graft Acute MI Revascularization;  Surgeon: Arnoldo Lapping, MD;  Location: [facility] INVASIVE CV LAB;  Service: Cardiovascular;  Laterality: N/A;   ESOPHAGOGASTRODUODENOSCOPY N/A 03/23/2023   Procedure: EGD (ESOPHAGOGASTRODUODENOSCOPY);  Surgeon: Elois Hair, MD;  Location: [facility] ENDOSCOPY;  Service: Gastroenterology;  Laterality: N/A;   ICD IMPLANT N/A 06/01/2023   Procedure: ICD IMPLANT;  Surgeon: Ardeen Kohler, MD;  Location: [facility] INVASIVE CV LAB;  Service: Cardiovascular;  Laterality: N/A;   LEFT HEART CATH AND CORONARY ANGIOGRAPHY N/A 10/30/2021   Procedure: LEFT HEART CATH AND CORONARY ANGIOGRAPHY;  Surgeon: Knox Perl, MD;  Location: [facility] INVASIVE CV LAB;  Service: Cardiovascular;  Laterality: N/A;   LEFT HEART CATH  AND CORONARY ANGIOGRAPHY N/A 12/10/2022   Procedure: LEFT HEART CATH AND CORONARY ANGIOGRAPHY;  Surgeon: Arnoldo Lapping, MD;  Location: [facility] INVASIVE CV LAB;  Service: Cardiovascular;  Laterality: N/A;   RIGHT HEART CATH N/A 03/20/2023    Procedure: RIGHT HEART CATH;  Surgeon: Darlis Eisenmenger, MD;  Location: [facility] INVASIVE CV LAB;  Service: Cardiovascular;  Laterality: N/A;   Social History:  reports that he has never smoked. He has never used smokeless tobacco. He reports that he does not drink alcohol and does not use drugs.  Allergies  Allergen Reactions   Beef-Derived Drug Products Other (See Comments)    Religious observance   Pork-Derived Products Other (See Comments)    Religious observance    Family History  Problem Relation Age of Onset   Diabetes Mother    Chronic Renal Failure Father    Diabetes Father    Chronic Renal Failure Brother    Diabetes Brother    Diabetes Brother    Heart attack Brother    Stroke Brother     Prior to Admission medications   Medication Sig Start Date End Date Taking? Authorizing Provider  acetaminophen  (TYLENOL ) 325 MG tablet Take 1-2 tablets (325-650 mg total) by mouth every 4 (four) hours as needed for mild pain (pain score 1-3). 06/02/23   Tylene Galla, PA-C  amiodarone  (PACERONE ) 200 MG tablet Take 1 tablet (200 mg total) by mouth daily. 03/25/23   Ruddy Corral M, PA-C  apixaban  (ELIQUIS ) 5 MG TABS tablet Take 1 tablet (5 mg total) by mouth 2 (two) times daily. 01/16/23   Gerald Kitty., NP  Ascorbic Acid (VITAMIN C PO) Take 1 tablet by mouth daily with lunch.    [provider]  clopidogrel  (PLAVIX ) 75 MG tablet Take 1 tablet (75 mg total) by mouth daily. 12/27/22   Arleene Belt, PA-C  Cyanocobalamin (B-12 PO) Take 1 tablet by mouth daily with lunch.    [provider]  digoxin  (LANOXIN ) 0.125 MG tablet Take 1 tablet (0.125 mg total) by mouth daily. 03/30/23   Darlis Eisenmenger, MD  empagliflozin  (JARDIANCE ) 10 MG TABS tablet Take 1 tablet (10 mg total) by mouth daily before breakfast. 01/16/23   Gerald Kitty., NP  ezetimibe  (ZETIA ) 10 MG tablet Take 1 tablet (10 mg total) by mouth daily. 04/07/23   Darlis Eisenmenger, MD   levothyroxine  (SYNTHROID ) 75 MCG tablet Take 1 tablet (75 mcg total) by mouth daily. 06/12/23   Jolanda Nation, NP  linaclotide  (LINZESS ) 145 MCG CAPS capsule Take 1 capsule (145 mcg total) by mouth daily before breakfast. 06/16/23 06/10/24  Collier, Amanda R, PA-C  MAGNESIUM PO Take 1 tablet by mouth at bedtime.    [provider]  midodrine  (PROAMATINE ) 2.5 MG tablet Take 1 tablet (2.5 mg total) by mouth 3 (three) times daily with meals. 06/13/23   Milford, Arlice Bene, FNP  mirtazapine  (REMERON ) 15 MG tablet Take 1 tablet (15 mg total) by mouth at bedtime. 04/14/23   Zehr, Jessica D, PA-C  ondansetron  (ZOFRAN -ODT) 4 MG disintegrating tablet Take 1 tablet (4 mg total) by mouth every 8 (eight) hours as needed. 04/05/23   Jolanda Nation, NP  pantoprazole  (PROTONIX ) 40 MG tablet Take 1 tablet (40 mg total) by mouth 2 (two) times daily before a meal. 02/06/23   Darlis Eisenmenger, MD  potassium chloride  SA (KLOR-CON  M) 20 MEQ tablet Take 2 tablets (40 mEq total) by mouth daily. 05/23/23   Vernia Good  M, FNP  rosuvastatin  (CRESTOR ) 40 MG tablet Take 40 mg by mouth daily.    [provider]  tadalafil  (CIALIS ) 20 MG tablet Take 1 tablet (20 mg total) by mouth daily. 05/23/23   Milford, Arlice Bene, FNP  torsemide  (DEMADEX ) 20 MG tablet Take 2 tablets (40 mg total) by mouth 2 (two) times daily. 05/23/23   Milford, Arlice Bene, FNP  Vitamin D-Vitamin K (VITAMIN K2-VITAMIN D3 PO) Take 1 tablet by mouth daily with lunch.    [provider]  zinc gluconate 50 MG tablet Take 50 mg by mouth daily with lunch.    [provider]    Physical Exam: Vitals:   06/30/23 0800 06/30/23 0815 06/30/23 0845 06/30/23 0900  BP: 93/71  99/72 101/77  Pulse: 64 67 68 80  Resp: (!) 0 (!) 24 17 20   Temp:      TempSrc:      SpO2: 100% 100% 100% 100%  Weight:      Height:       Constitutional: Early male currently in no acute distress Eyes: PERRL, lids and conjunctivae normal ENMT: Mucous membranes  are moist. Posterior pharynx clear of any exudate or lesions.Normal dentition.  Neck: normal, supple.  JVD present Respiratory: clear to auscultation bilaterally, no wheezing, no crackles. Normal respiratory effort. No accessory muscle use.  Cardiovascular: Regular rate and rhythm,   no significant lower extremity edema. 2+ pedal pulses.   Abdomen: no tenderness, no masses palpated.  Bowel sounds positive.  Musculoskeletal: no clubbing / cyanosis. No joint deformity upper and lower extremities. Good ROM, no contractures. Normal muscle tone.  Skin: no rashes, lesions, ulcers. No induration Neurologic: CN 2-12 grossly intact. Sensation intact, DTR normal. Strength 5/5 in all 4.  Psychiatric: Normal judgment and insight. Alert and oriented x 3. Normal mood.   Data Reviewed:  EKG revealed normal sinus rhythm at 80 bpm with right bundle branch block.  Reviewed labs, imaging, and pertinent records as documented.  Assessment and Plan:   Heart failure with reduced ejection fraction Pleural effusion Acute on chronic.  Patient presented with acute worsening shortness of breath and orthopnea despite increasing Lasix  dose.  Patient noted to have JVD present on physical exam.  BNP was elevated at 1125.8. Chest x-ray noted mild right perihilar edema, small right pleural effusion, and associated right lower lobe opacity thought to be atelectasis. Last echocardiogram noted EF to be 25 to 30% with indeterminate diastolic parameters when checked 03/30/2022.  Patient status post Biotronik ICD in 05/2023.  Patient had been given Lasix  40 mg IV x 1 dose by the ED provider and followed by additional 40 mg of Lasix  IV after seen by cardiology. - Admit to a cardiac telemetry bed - Strict I&Os and daily weights - Lasix  IV per cardiology given blood pressures - Goal-directed therapy limited due to hypotension - Appreciate cardiology consultative services we will follow-up for any further recommendations  Chronic  hypotension Blood pressures initially as low as 79/61. -Goal maps greater than 65 - Continue midodrine  may need dose adjustment  CAD Patient with prior history of stents - Continue Plavix , Crestor , Zetia   Paroxysmal atrial fibrillation on chronic anticoagulation Patient currently in normal sinus rhythm. - Continue amiodarone , digoxin , and Eliquis   Renal insufficiency  Acute.  Creatinine noted to be 1.48 with BUN 28.  Baseline creatinine appears to range from 1.3-1.6 - Continue to monitor kidney function.  Diabetes mellitus type 2, without long-term use of insulin  Last available hemoglobin A1c noted to  be 7 when checked on 6/2.   - Continue Jardiance   Hypothyroidism TSH noted to be 22.402 on 6/30 for which patient levothyroxine  was increased. - Commend outpatient follow-up of thyroid  studies in 2 to 4 weeks - Continue levothyroxine   GERD - Continue Protonix    DVT prophylaxis:  Eliquis   Advance Care Planning:   Code Status: Full Code    Consults: Cardiology Family Communication: None  Severity of Illness: The appropriate patient status for this patient is OBSERVATION. Observation status is judged to be reasonable and necessary in order to provide the required intensity of service to ensure the patient's safety. The patient's presenting symptoms, physical exam findings, and initial radiographic and laboratory data in the context of their medical condition is felt to place them at decreased risk for further clinical deterioration. Furthermore, it is anticipated that the patient will be medically stable for discharge from the hospital within 2 midnights of admission.   Author: Lena Qualia, MD 06/30/2023 9:23 AM  For on call review [email or web address].

## 2023-07-01 DIAGNOSIS — E039 Hypothyroidism, unspecified: Secondary | ICD-10-CM | POA: Diagnosis not present

## 2023-07-01 DIAGNOSIS — Z7984 Long term (current) use of oral hypoglycemic drugs: Secondary | ICD-10-CM | POA: Diagnosis not present

## 2023-07-01 DIAGNOSIS — K219 Gastro-esophageal reflux disease without esophagitis: Secondary | ICD-10-CM | POA: Diagnosis present

## 2023-07-01 DIAGNOSIS — I5084 End stage heart failure: Secondary | ICD-10-CM | POA: Diagnosis not present

## 2023-07-01 DIAGNOSIS — Z823 Family history of stroke: Secondary | ICD-10-CM | POA: Diagnosis not present

## 2023-07-01 DIAGNOSIS — Z01810 Encounter for preprocedural cardiovascular examination: Secondary | ICD-10-CM | POA: Diagnosis not present

## 2023-07-01 DIAGNOSIS — Z7901 Long term (current) use of anticoagulants: Secondary | ICD-10-CM | POA: Diagnosis not present

## 2023-07-01 DIAGNOSIS — I480 Paroxysmal atrial fibrillation: Secondary | ICD-10-CM | POA: Diagnosis not present

## 2023-07-01 DIAGNOSIS — Z79890 Hormone replacement therapy (postmenopausal): Secondary | ICD-10-CM | POA: Diagnosis not present

## 2023-07-01 DIAGNOSIS — Z95810 Presence of automatic (implantable) cardiac defibrillator: Secondary | ICD-10-CM | POA: Diagnosis not present

## 2023-07-01 DIAGNOSIS — N182 Chronic kidney disease, stage 2 (mild): Secondary | ICD-10-CM | POA: Diagnosis present

## 2023-07-01 DIAGNOSIS — E785 Hyperlipidemia, unspecified: Secondary | ICD-10-CM | POA: Diagnosis not present

## 2023-07-01 DIAGNOSIS — Z515 Encounter for palliative care: Secondary | ICD-10-CM | POA: Diagnosis not present

## 2023-07-01 DIAGNOSIS — I5020 Unspecified systolic (congestive) heart failure: Secondary | ICD-10-CM | POA: Diagnosis not present

## 2023-07-01 DIAGNOSIS — Z833 Family history of diabetes mellitus: Secondary | ICD-10-CM | POA: Diagnosis not present

## 2023-07-01 DIAGNOSIS — E1122 Type 2 diabetes mellitus with diabetic chronic kidney disease: Secondary | ICD-10-CM | POA: Diagnosis not present

## 2023-07-01 DIAGNOSIS — I5023 Acute on chronic systolic (congestive) heart failure: Secondary | ICD-10-CM | POA: Diagnosis present

## 2023-07-01 DIAGNOSIS — Z79899 Other long term (current) drug therapy: Secondary | ICD-10-CM | POA: Diagnosis not present

## 2023-07-01 DIAGNOSIS — I255 Ischemic cardiomyopathy: Secondary | ICD-10-CM | POA: Diagnosis present

## 2023-07-01 DIAGNOSIS — Z681 Body mass index (BMI) 19 or less, adult: Secondary | ICD-10-CM | POA: Diagnosis not present

## 2023-07-01 DIAGNOSIS — R0602 Shortness of breath: Secondary | ICD-10-CM | POA: Diagnosis not present

## 2023-07-01 DIAGNOSIS — I081 Rheumatic disorders of both mitral and tricuspid valves: Secondary | ICD-10-CM | POA: Diagnosis not present

## 2023-07-01 DIAGNOSIS — I2721 Secondary pulmonary arterial hypertension: Secondary | ICD-10-CM | POA: Diagnosis not present

## 2023-07-01 DIAGNOSIS — Z7902 Long term (current) use of antithrombotics/antiplatelets: Secondary | ICD-10-CM | POA: Diagnosis not present

## 2023-07-01 DIAGNOSIS — Z7189 Other specified counseling: Secondary | ICD-10-CM | POA: Diagnosis not present

## 2023-07-01 DIAGNOSIS — Z8249 Family history of ischemic heart disease and other diseases of the circulatory system: Secondary | ICD-10-CM | POA: Diagnosis not present

## 2023-07-01 DIAGNOSIS — I2510 Atherosclerotic heart disease of native coronary artery without angina pectoris: Secondary | ICD-10-CM | POA: Diagnosis not present

## 2023-07-01 DIAGNOSIS — I130 Hypertensive heart and chronic kidney disease with heart failure and stage 1 through stage 4 chronic kidney disease, or unspecified chronic kidney disease: Secondary | ICD-10-CM | POA: Diagnosis not present

## 2023-07-01 LAB — BASIC METABOLIC PANEL WITH GFR
Anion gap: 10 (ref 5–15)
BUN: 24 mg/dL — ABNORMAL HIGH (ref 8–23)
CO2: 27 mmol/L (ref 22–32)
Calcium: 9.3 mg/dL (ref 8.9–10.3)
Chloride: 102 mmol/L (ref 98–111)
Creatinine, Ser: 1.28 mg/dL — ABNORMAL HIGH (ref 0.61–1.24)
GFR, Estimated: >60 mL/min (ref >60)
Glucose, Bld: 98 mg/dL (ref 70–99)
Potassium: 3.7 mmol/L (ref 3.5–5.1)
Sodium: 139 mmol/L (ref 135–145)

## 2023-07-01 LAB — MAGNESIUM: Magnesium: 2.4 mg/dL (ref 1.7–2.4)

## 2023-07-01 LAB — BRAIN NATRIURETIC PEPTIDE: B Natriuretic Peptide: 1061 pg/mL — ABNORMAL HIGH (ref 0.0–100.0)

## 2023-07-01 MED ORDER — FUROSEMIDE 10 MG/ML IJ SOLN
40.0000 mg | Freq: Every day | INTRAMUSCULAR | Status: DC
  Administered 2023-07-02: 40 mg via INTRAVENOUS
  Filled 2023-07-01: qty 4

## 2023-07-01 MED ORDER — ALBUMIN HUMAN 25 % IV SOLN
12.5000 g | Freq: Once | INTRAVENOUS | Status: AC
  Filled 2023-07-01: qty 50

## 2023-07-01 MED ORDER — ALBUMIN HUMAN 25 % IV SOLN
INTRAVENOUS | Status: AC
  Administered 2023-07-01: 12.5 g via INTRAVENOUS
  Filled 2023-07-01: qty 50

## 2023-07-01 MED ORDER — MIDODRINE HCL 5 MG PO TABS
5.0000 mg | ORAL_TABLET | Freq: Two times a day (BID) | ORAL | Status: DC
  Administered 2023-07-01: 5 mg via ORAL
  Filled 2023-07-01: qty 1

## 2023-07-01 MED ORDER — SODIUM CHLORIDE 0.9 % IV BOLUS
250.0000 mL | Freq: Once | INTRAVENOUS | Status: AC
  Administered 2023-07-01: 250 mL via INTRAVENOUS

## 2023-07-01 MED ORDER — MIDODRINE HCL 5 MG PO TABS
5.0000 mg | ORAL_TABLET | Freq: Three times a day (TID) | ORAL | Status: DC
  Administered 2023-07-01 – 2023-07-05 (×12): 5 mg via ORAL
  Filled 2023-07-01 (×12): qty 1

## 2023-07-01 NOTE — Care Management Obs Status (Signed)
 MEDICARE OBSERVATION STATUS NOTIFICATION   Patient Details  Name: Milind Raether MRN: [id number] Date of Birth: 06-Oct-1955   Medicare Observation Status Notification Given:  Yes    Jon Cruel 07/01/2023, 1:52 PM

## 2023-07-01 NOTE — Progress Notes (Signed)
 PROGRESS NOTE    Patient: Garrett Roberts                            PCP: Vincente Shivers, NP                    DOB: [birth year]            DOA: 06/30/2023 FMW:[id number]             DOS: 07/01/2023, 12:06 PM   LOS: 0 days   Date of Service: The patient was seen and examined on 07/01/2023  Subjective:   The patient was seen and examined this morning. Hemodynamically stable. No issues overnight .  Brief Narrative:   Ahlijah Salo is a 68 y.o. with history of CAD, PAF, DM II, HTN, HLD, ischemic cardiomyopathy/chronic systolic CHF.    Patient had inferior STEMI 10/23 s/p PCI/DES to RCA. Had residual diffuse disease in diagonals and OMs. EF 40-45% at time of cath. Subsequent echo in 10/23 with EF 55-60%.  Echo 3/25 showed EF 25-30%   Presents with complaints of shortness of breath and chest pain, feet swelling, and when laying flat for the past 3 days.  ED:  He was afebrile with mild tachypnea with ambulation,  BUN 28, creatinine 148. BNP 1125.8, high-sensitivity troponin 18-> 19,  Chest x-ray noted mild right perihilar edema, small right pleural effusion, and associated right lower lobe opacity thought to be atelectasis.  Patient had been given Lasix  40 mg IV x 2 doses and 80 meq of potassium chloride .   Cardiology had been consulted and plan to take the patient for right heart cath.    Assessment & Plan:   Principal Problem:   HFrEF (heart failure with reduced ejection fraction) (HCC) Active Problems:   Recurrent right pleural effusion   Chronic hypotension   CAD (coronary artery disease)   Atrial fibrillation (HCC)   Renal insufficiency   Type 2 diabetes mellitus with complication, without long-term current use of insulin  (HCC)   Hypothyroidism   GERD (gastroesophageal reflux disease)  Assessment and Plan:     Heart failure with reduced ejection fraction Pleural effusion - Hypotensive this morning, with a BP of 81/56  - Acute on chronic.  Patient presented  with acute worsening shortness of breath and orthopne -  BNP was elevated at 1125.8. >>   - Chest x-ray noted mild right perihilar edema, small right pleural effusion, and associated right lower lobe opacity thought to be atelectasis.  Last echo; EF to be 25 to 30% with indeterminate diastolic parameters when checked 03/30/2022.   Patient status post Biotronik ICD in 05/2023.       Patient had been given Lasix  40 mg IV x 2 - Admit to a cardiac telemetry bed - Strict I&Os and daily weights Continue with diuresis - 06/30/2023 s/p right cardiac cath: Moderate-severely reduced CO, volume overload, mild-moderate mixed pulmonary hypertension -Biotronik ICD 05/2023 -Jardiance  10 mg a day - Furosemide  40 mg IV daily -Midodrine  5 mg twice a day to support blood pressure -Albumin  12.5 g IV once    Chronic hypotension -12.5 g of IV albumin , -BP running low 81/56 -Goal maps greater than 65 - Continue Midodrine   -May increase dose   CAD Patient with prior history of stents - Continue Plavix , Crestor , Zetia    Paroxysmal atrial fibrillation on chronic anticoagulation Patient currently in normal sinus rhythm. - Continue amiodarone , digoxin , and Eliquis    Renal insufficiency  Baseline creatinine appears to range from 1.3-1.6 - Continue to monitor kidney function. -Creatinine of 1.28   Diabetes mellitus type 2, without long-term use of insulin  06/12/2023 last A1c 7.0,   - Continue Jardiance    Hypothyroidism TSH noted to be 22.402 on 6/30 for which patient levothyroxine  was increased. - Commend outpatient follow-up of thyroid  studies in 2 to 4 weeks - Continue levothyroxine    GERD - Continue Protonix    ---------------------------------------------------------------------------------------------------------------- Nutritional status:  The patient's BMI is: Body mass index is 19.05 kg/m. I agree with the assessment and plan as outlined below: Nutrition Status:           ----------------------------------------------------------------------------------------------------------------  DVT prophylaxis:   apixaban  (ELIQUIS ) tablet 5 mg   Code Status:   Code Status: Full Code  Family Communication: Daughter present at bedside-updated -Advance care planning has been discussed.   Admission status:   Status is: Observation The patient remains OBS appropriate and will d/c before 2 midnights.   Disposition: From  - home             Planning for discharge in 1-2 days: to   Procedures:   No admission procedures for hospital encounter.   Antimicrobials:  Anti-infectives (From admission, onward)    None        Medication:   amiodarone   200 mg Oral Daily   apixaban   5 mg Oral BID   clopidogrel   75 mg Oral Daily   digoxin   0.125 mg Oral Daily   empagliflozin   10 mg Oral QAC breakfast   ezetimibe   10 mg Oral Daily   [START ON 07/02/2023] furosemide   40 mg Intravenous Daily   levothyroxine   75 mcg Oral QAC breakfast   linaclotide   145 mcg Oral QAC breakfast   midodrine   5 mg Oral BID WC   pantoprazole   40 mg Oral BID AC   potassium chloride  SA  40 mEq Oral Daily   rosuvastatin   40 mg Oral Daily   sodium chloride  flush  3 mL Intravenous Q12H    melatonin   Objective:   Vitals:   06/30/23 1941 06/30/23 2344 07/01/23 0451 07/01/23 0814  BP: 96/69 (!) 81/52 100/65 (!) 81/56  Pulse: 70 61 65 62  Resp: 19 18 18 16   Temp: 97.9 F (36.6 C) 97.8 F (36.6 C) (!) 97.4 F (36.3 C) 97.6 F (36.4 C)  TempSrc: Oral Oral Oral Oral  SpO2: 100% 93% 99% 98%  Weight:   63.7 kg   Height:       No intake or output data in the 24 hours ending 07/01/23 1206 Filed Weights   06/30/23 0046 06/30/23 1700 07/01/23 0451  Weight: 68 kg 63.4 kg 63.7 kg     Physical examination:   Constitution:  Alert, cooperative, no distress,  Appears calm and comfortable  Psychiatric:   Normal and stable mood and affect, cognition intact,   HEENT:         Normocephalic, PERRL, otherwise with in Normal limits  Chest:         Chest symmetric Cardio vascular:  S1/S2, RRR, No murmure, No Rubs or Gallops  pulmonary: Clear to auscultation bilaterally, respirations unlabored, negative wheezes / crackles Abdomen: Soft, non-tender, non-distended, bowel sounds,no masses, no organomegaly Muscular skeletal: Limited exam - in bed, able to move all 4 extremities,   Neuro: CNII-XII intact. , normal motor and sensation, reflexes intact  Extremities: No pitting edema lower extremities, +2 pulses  Skin: Dry, warm to touch, negative for any Rashes, No  open wounds Wounds: per nursing documentation   ------------------------------------------------------------------------------------------------------------------------------------------    LABs:     Latest Ref Rng & Units 06/30/2023    2:24 PM 06/30/2023    2:20 PM 06/30/2023    2:19 PM  CBC  Hemoglobin 13.0 - 17.0 g/dL 84.3  84.3  87.0   Hematocrit 39.0 - 52.0 % 46.0  46.0  38.0       Latest Ref Rng & Units 07/01/2023    3:16 AM 06/30/2023    2:24 PM 06/30/2023    2:20 PM  CMP  Glucose 70 - 99 mg/dL 98     BUN 8 - 23 mg/dL 24     Creatinine 9.38 - 1.24 mg/dL 8.71     Sodium 864 - 854 mmol/L 139  138  138   Potassium 3.5 - 5.1 mmol/L 3.7  4.1  4.0   Chloride 98 - 111 mmol/L 102     CO2 22 - 32 mmol/L 27     Calcium  8.9 - 10.3 mg/dL 9.3          Micro Results No results found for this or any previous visit (from the past 240 hours).  Radiology Reports CARDIAC CATHETERIZATION Result Date: 06/30/2023 Findings: RA = 5 RV = 65/10 PA = 62/21 (35) PCW = 23 Fick cardiac output/index = 4.0/2.1 Thermo CO/CI = 3.9/2.1 PVR = 3.0 WU Ao sat = 99% PA sat = 68% PAPi = 8.2 Assessment: 1. Moderate to severely reduced CO 2. Volume overload 3. 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 Plan/Discussion: Continue diuresis. Consider w/u for advanced therapies as outpatient Daniel Bensimhon, MD 3:37 PM   SIGNED: Adriana DELENA Grams,  MD, FHM. FAAFP. Jolynn Pack - [facility] Time spent - 55 min.  In seeing, evaluating and examining the patient. Reviewing medical records, labs, drawn plan of care. [facility],  Pager (please use [email or web address] to page/ text) Please use Epic Secure Chat for non-urgent communication (7AM-7PM)  If 7PM-7AM, please contact night-coverage [email or web address], 07/01/2023, 12:06 PM

## 2023-07-01 NOTE — Plan of Care (Signed)
 Pt labs and assessments unremarkable

## 2023-07-01 NOTE — Hospital Course (Addendum)
 Garrett Roberts is a 68 y.o. with history of CAD, PAF, DM II, HTN, HLD, ischemic cardiomyopathy/chronic systolic CHF.    Patient had inferior STEMI 10/23 s/p PCI/DES to RCA. Had residual diffuse disease in diagonals and OMs. EF 40-45% at time of cath. Subsequent echo in 10/23 with EF 55-60%.  Echo 3/25 showed EF 25-30%   Presents with complaints of shortness of breath and chest pain, feet swelling, and when laying flat for the past 3 days.  ED:  He was afebrile with mild tachypnea with ambulation,  BUN 28, creatinine 148. BNP 1125.8, high-sensitivity troponin 18-> 19,  Chest x-ray noted mild right perihilar edema, small right pleural effusion, and associated right lower lobe opacity thought to be atelectasis.  Patient had been given Lasix  40 mg IV x 2 doses and 80 meq of potassium chloride .   Cardiology had been consulted and plan to take the patient for right heart cath.    Assessment & Plan:   Principal Problem:   HFrEF (heart failure with reduced ejection fraction) (HCC) Active Problems:   Recurrent right pleural effusion   Chronic hypotension   CAD (coronary artery disease)   Atrial fibrillation (HCC)   Renal insufficiency   Type 2 diabetes mellitus with complication, without long-term current use of insulin  (HCC)   Hypothyroidism   GERD (gastroesophageal reflux disease)  Assessment and Plan:     Heart failure with reduced ejection fraction Pleural effusion - Hypotensive this morning, with a BP of 81/56  - Acute on chronic.  Patient presented with acute worsening shortness of breath and orthopne -  BNP was elevated at 1125.8. >>   - Chest x-ray noted mild right perihilar edema, small right pleural effusion, and associated right lower lobe opacity thought to be atelectasis.  Last echo; EF to be 25 to 30% with indeterminate diastolic parameters when checked 03/30/2022.   Patient status post Biotronik ICD in 05/2023.       Patient had been given Lasix  40 mg IV x 2 -  Admit to a cardiac telemetry bed - Strict I&Os and daily weights Continue with diuresis - 06/30/2023 s/p right cardiac cath: Moderate-severely reduced CO, volume overload, mild-moderate mixed pulmonary hypertension -Biotronik ICD 05/2023 -Jardiance  10 mg a day - Furosemide  40 mg IV daily -Midodrine  5 mg twice a day to support blood pressure -Albumin  12.5 g IV once    Chronic hypotension -12.5 g of IV albumin , -BP running low 81/56 -Goal maps greater than 65 - Continue Midodrine   -May increase dose   CAD Patient with prior history of stents - Continue Plavix , Crestor , Zetia    Paroxysmal atrial fibrillation on chronic anticoagulation Patient currently in normal sinus rhythm. - Continue amiodarone , digoxin , and Eliquis    Renal insufficiency  Baseline creatinine appears to range from 1.3-1.6 - Continue to monitor kidney function. -Creatinine of 1.28   Diabetes mellitus type 2, without long-term use of insulin  06/12/2023 last A1c 7.0,   - Continue Jardiance    Hypothyroidism TSH noted to be 22.402 on 6/30 for which patient levothyroxine  was increased. - Commend outpatient follow-up of thyroid  studies in 2 to 4 weeks - Continue levothyroxine    GERD - Continue Protonix 

## 2023-07-01 NOTE — Progress Notes (Signed)
  Progress Note  Patient Name: Garrett Roberts Date of Encounter: 07/01/2023 [facility] Cardiologist: [name], MD   Interval Summary   Able to breathe more comfortably.  Less orthopnea.  Vital Signs Vitals:   06/30/23 1941 06/30/23 2344 07/01/23 0451 07/01/23 0814  BP: 96/69 (!) 81/52 100/65 (!) 81/56  Pulse: 70 61 65 62  Resp: 19 18 18 16   Temp: 97.9 F (36.6 C) 97.8 F (36.6 C) (!) 97.4 F (36.3 C) 97.6 F (36.4 C)  TempSrc: Oral Oral Oral Oral  SpO2: 100% 93% 99% 98%  Weight:   63.7 kg   Height:       No intake or output data in the 24 hours ending 07/01/23 1020    07/01/2023    4:51 AM 06/30/2023    5:00 PM 06/30/2023   12:46 AM  Last 3 Weights  Weight (lbs) 140 lb 6.9 oz 139 lb 12.4 oz 150 lb  Weight (kg) 63.7 kg 63.4 kg 68.04 kg      Telemetry/ECG  Sinus rhythm- Personally Reviewed  Right heart catheterization 06/30/2023: Findings:   RA = 5 RV = 65/10 PA = 62/21 (35) PCW = 23 Fick cardiac output/index = 4.0/2.1 Thermo CO/CI = 3.9/2.1 PVR = 3.0 WU Ao sat = 99% PA sat = 68% PAPi = 8.2   Assessment: 1. Moderate to severely reduced CO 2. Volume overload 3. 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   Physical Exam  GEN: No acute distress.  Thin Neck: No JVD Cardiac: RRR, no murmurs, rubs, or gallops.  Respiratory: Breath sounds blunted right base. GI: Soft, nontender, non-distended  MS: No edema  Assessment & Plan   Acute on chronic systolic heart failure secondary to ischemic cardiomyopathy with ejection fraction of 20% -Continue with diuresis -Right heart cath reviewed as above -Biotronik ICD 05/2023 -Jardiance  10 mg a day - Furosemide  40 mg IV daily -Midodrine  5 mg twice a day to support blood pressure -Albumin  12.5 g IV once previously ordered  CAD - Inferior STEMI back in 2023 DES to RCA also had an anterior STEMI in 2024 occluded proximal LAD treated with DES - Plavix , Eliquis  (no aspirin ) - Continue rosuvastatin  40 mg  plus Zetia  10 mg with LDL goal less than 55  Paroxysmal atrial fibrillation - Currently normal sinus rhythm on telemetry continue with Eliquis  for chronic anticoagulation with no bleeding issues.  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  No known lung disease not a smoker chronic interstitial changes high-resolution CT of chest with bilateral bibasilar bronchiectasis may be due to aspiration although  swallowing study was negative.  VQ scan no chronic PE, rheumatologic studies negative. -Because of hypotension, holding tadalafil   Pleural effusion - Had a right thoracentesis in March or 2025, transudative  GI significant weight loss - Likely secondary from low output heart failure.  Had several questions about long-term prognosis.  Discussed heart team approach with heart failure staff.   For questions or updates, please contact  [facility] Please consult [email or web address] for contact info under       Signed, Oneil Parchment, MD

## 2023-07-02 ENCOUNTER — Encounter (HOSPITAL_COMMUNITY): Payer: Self-pay | Admitting: Internal Medicine

## 2023-07-02 DIAGNOSIS — I5020 Unspecified systolic (congestive) heart failure: Secondary | ICD-10-CM | POA: Diagnosis not present

## 2023-07-02 LAB — COMPREHENSIVE METABOLIC PANEL WITH GFR
ALT: 25 U/L (ref 0–44)
AST: 28 U/L (ref 15–41)
Albumin: 3.4 g/dL — ABNORMAL LOW (ref 3.5–5.0)
Alkaline Phosphatase: 71 U/L (ref 38–126)
Anion gap: 11 (ref 5–15)
BUN: 25 mg/dL — ABNORMAL HIGH (ref 8–23)
CO2: 25 mmol/L (ref 22–32)
Calcium: 9.3 mg/dL (ref 8.9–10.3)
Chloride: 101 mmol/L (ref 98–111)
Creatinine, Ser: 1.26 mg/dL — ABNORMAL HIGH (ref 0.61–1.24)
GFR, Estimated: >60 mL/min (ref >60)
Glucose, Bld: 178 mg/dL — ABNORMAL HIGH (ref 70–99)
Potassium: 4.4 mmol/L (ref 3.5–5.1)
Sodium: 137 mmol/L (ref 135–145)
Total Bilirubin: 0.4 mg/dL (ref 0.0–1.2)
Total Protein: 6.6 g/dL (ref 6.5–8.1)

## 2023-07-02 LAB — CBC
HCT: 44.4 % (ref 39.0–52.0)
Hemoglobin: 14 g/dL (ref 13.0–17.0)
MCH: 28.5 pg (ref 26.0–34.0)
MCHC: 31.5 g/dL (ref 30.0–36.0)
MCV: 90.4 fL (ref 80.0–100.0)
Platelets: 240 10*3/uL (ref 150–400)
RBC: 4.91 MIL/uL (ref 4.22–5.81)
RDW: 14.7 % (ref 11.5–15.5)
WBC: 5.6 10*3/uL (ref 4.0–10.5)
nRBC: 0 % (ref 0.0–0.2)

## 2023-07-02 LAB — GLUCOSE, CAPILLARY: Glucose-Capillary: 207 mg/dL — ABNORMAL HIGH (ref 70–99)

## 2023-07-02 MED ORDER — EZETIMIBE 10 MG PO TABS
10.0000 mg | ORAL_TABLET | Freq: Every day | ORAL | 3 refills | Status: AC
  Filled 2023-07-02: qty 90, 90d supply, fill #0
  Filled 2023-07-03: qty 30, 30d supply, fill #0

## 2023-07-02 MED ORDER — DIGOXIN 125 MCG PO TABS
0.1250 mg | ORAL_TABLET | Freq: Every day | ORAL | 3 refills | Status: DC
  Filled 2023-07-02: qty 90, 90d supply, fill #0

## 2023-07-02 MED ORDER — ALBUMIN HUMAN 25 % IV SOLN
12.5000 g | Freq: Once | INTRAVENOUS | Status: AC
  Administered 2023-07-02: 12.5 g via INTRAVENOUS
  Filled 2023-07-02: qty 50

## 2023-07-02 MED ORDER — MIDODRINE HCL 5 MG PO TABS
5.0000 mg | ORAL_TABLET | Freq: Three times a day (TID) | ORAL | 0 refills | Status: DC
  Filled 2023-07-02 – 2023-07-03 (×2): qty 90, 30d supply, fill #0

## 2023-07-02 MED ORDER — AMIODARONE HCL 200 MG PO TABS
200.0000 mg | ORAL_TABLET | Freq: Every day | ORAL | 5 refills | Status: DC
  Filled 2023-07-02 – 2023-07-03 (×2): qty 30, 30d supply, fill #0

## 2023-07-02 NOTE — Plan of Care (Signed)

## 2023-07-02 NOTE — Progress Notes (Signed)
 PROGRESS NOTE    Patient: Garrett Roberts                            PCP: Vincente Shivers, NP                    DOB: [birth year]            DOA: 06/30/2023 FMW:[id number]             DOS: 07/02/2023, 1:21 PM   LOS: 1 day   Date of Service: The patient was seen and examined on 07/02/2023  Subjective:   The patient was seen and examined this morning, stable no acute distress denies any chest pain or shortness of breath Complaining of severe generalized weakness, shortness of breath only with exertion  Brief Narrative:   Garrett Roberts is a 68 y.o. with history of CAD, PAF, DM II, HTN, HLD, ischemic cardiomyopathy/chronic systolic CHF.    Patient had inferior STEMI 10/23 s/p PCI/DES to RCA. Had residual diffuse disease in diagonals and OMs. EF 40-45% at time of cath. Subsequent echo in 10/23 with EF 55-60%.  Echo 3/25 showed EF 25-30%   Presents with complaints of shortness of breath and chest pain, feet swelling, and when laying flat for the past 3 days.  ED:  He was afebrile with mild tachypnea with ambulation,  BUN 28, creatinine 148. BNP 1125.8, high-sensitivity troponin 18-> 19,  Chest x-ray noted mild right perihilar edema, small right pleural effusion, and associated right lower lobe opacity thought to be atelectasis.  Patient had been given Lasix  40 mg IV x 2 doses and 80 meq of potassium chloride .   Cardiology had been consulted and plan to take the patient for right heart cath.    Assessment & Plan:   Principal Problem:   HFrEF (heart failure with reduced ejection fraction) (HCC) Active Problems:   Recurrent right pleural effusion   Chronic hypotension   CAD (coronary artery disease)   Atrial fibrillation (HCC)   Renal insufficiency   Type 2 diabetes mellitus with complication, without long-term current use of insulin  (HCC)   Hypothyroidism   GERD (gastroesophageal reflux disease)  Assessment and Plan:     HFrEF Pleural effusion - Still mildly  hypotensive but asymptomatic  - Acute on chronic.  Patient presented with acute worsening shortness of breath and orthopne -  BNP was elevated at 1125.8. >>  1061.0  - Chest x-ray mild right perihilar edema, small Rt pleural effusion, Rt LL lobe opacity thought to be atelectasis.   Last echo; EF to be 25 to 30% with indeterminate diastolic parameters when checked 03/30/2022.   Patient status post Biotronik ICD in 05/2023.     Patient had been given Lasix  40 mg IV x 2   - Strict I&Os and daily weights Continue with diuresis - 06/30/2023 s/p right cardiac cath: Moderate-severely reduced CO, volume overload, mild-moderate mixed pulmonary hypertension  -Biotronik ICD 05/2023 -Jardiance  10 mg a day - Furosemide  40 mg IV daily -Midodrine  5 mg twice a day to support blood pressure -Albumin  12.5 g IV X 2     Chronic hypotension -Hypotensive likely chronic -12.5 g of IV albumin - X 1 more dose today -Continue midodrine  -Goal maps > 65    CAD -with history of stenting, on Plavix , Eliquis , Crestor , Zetia -no ASA    Paroxysmal atrial fibrillation  - In normal sinus rhythm,  - Will continue Digoxin , Amiodarone , Eliquis   Renal insufficiency  Baseline creatinine appears to range from 1.3-1.6 - Continue to monitor kidney function. -Creatinine of 1.28>> 1.26   Diabetes mellitus type 2, without long-term use of insulin  06/12/2023 last A1c 7.0,   - Continue Jardiance , SSI    Hypothyroidism TSH noted to be 22.402 on 6/30 for which patient levothyroxine  was increased. - Commend outpatient follow-up of thyroid  studies in 2 to 4 weeks - Continue levothyroxine    GERD - Continue Protonix    ---------------------------------------------------------------------------------------------------------------- Nutritional status:  The patient's BMI is: Body mass index is 19.05 kg/m. I agree with the assessment and plan as outlined below: Nutrition Status:       -------------------------------------------------------------------------------------  DVT prophylaxis:   apixaban  (ELIQUIS ) tablet 5 mg   Code Status:   Code Status: Full Code  Family Communication: Daughter present at bedside-updated -Advance care planning has been discussed.   Admission status:   Status is: Observation The patient remains OBS appropriate and will d/c before 2 midnights.   Disposition: From  - home             Planning for discharge in 1 days. Home   Procedures:   No admission procedures for hospital encounter.   Antimicrobials:  Anti-infectives (From admission, onward)    None        Medication:   amiodarone   200 mg Oral Daily   apixaban   5 mg Oral BID   clopidogrel   75 mg Oral Daily   digoxin   0.125 mg Oral Daily   empagliflozin   10 mg Oral QAC breakfast   ezetimibe   10 mg Oral Daily   furosemide   40 mg Intravenous Daily   levothyroxine   75 mcg Oral QAC breakfast   linaclotide   145 mcg Oral QAC breakfast   midodrine   5 mg Oral TID WC   pantoprazole   40 mg Oral BID AC   potassium chloride  SA  40 mEq Oral Daily   rosuvastatin   40 mg Oral Daily   sodium chloride  flush  3 mL Intravenous Q12H    melatonin   Objective:   Vitals:   07/01/23 1913 07/02/23 0400 07/02/23 0710 07/02/23 1101  BP: 107/68 (!) 83/61 106/76 117/86  Pulse:  67 67 70  Resp: 18 18 20  (!) 24  Temp: 97.9 F (36.6 C) 98 F (36.7 C) 97.6 F (36.4 C) (!) 97.5 F (36.4 C)  TempSrc: Oral Oral Oral Oral  SpO2: 100% 92% 100% 100%  Weight:      Height:        Intake/Output Summary (Last 24 hours) at 07/02/2023 1321 Last data filed at 07/02/2023 1258 Gross per 24 hour  Intake 360 ml  Output 600 ml  Net -240 ml   Filed Weights   06/30/23 0046 06/30/23 1700 07/01/23 0451  Weight: 68 kg 63.4 kg 63.7 kg     Physical examination:    General:  AAO x 3,  cooperative, no distress;   HEENT:  Normocephalic, PERRL, otherwise with in Normal limits   Neuro:  CNII-XII  intact. , normal motor and sensation, reflexes intact   Lungs:   Clear to auscultation BL, Respirations unlabored,  No wheezes / crackles  Cardio:    S1/S2, RRR, No murmure, No Rubs or Gallops   Abdomen:  Soft, non-tender, bowel sounds active all four quadrants, no guarding or peritoneal signs.  Muscular  skeletal:  Limited exam -global generalized weaknesses - in bed, able to move all 4 extremities,   2+ pulses,  symmetric, No pitting edema  Skin:  Dry, warm to touch, negative for any Rashes,  Wounds: Please see nursing documentation    -------------------------------------------------------------------------------------    LABs:     Latest Ref Rng & Units 07/02/2023    2:55 AM 06/30/2023    2:24 PM 06/30/2023    2:20 PM  CBC  WBC 4.0 - 10.5 K/uL 5.6     Hemoglobin 13.0 - 17.0 g/dL 85.9  84.3  84.3   Hematocrit 39.0 - 52.0 % 44.4  46.0  46.0   Platelets 150 - 400 K/uL 240         Latest Ref Rng & Units 07/02/2023    2:55 AM 07/01/2023    3:16 AM 06/30/2023    2:24 PM  CMP  Glucose 70 - 99 mg/dL 821  98    BUN 8 - 23 mg/dL 25  24    Creatinine 9.38 - 1.24 mg/dL 8.73  8.71    Sodium 864 - 145 mmol/L 137  139  138   Potassium 3.5 - 5.1 mmol/L 4.4  3.7  4.1   Chloride 98 - 111 mmol/L 101  102    CO2 22 - 32 mmol/L 25  27    Calcium  8.9 - 10.3 mg/dL 9.3  9.3    Total Protein 6.5 - 8.1 g/dL 6.6     Total Bilirubin 0.0 - 1.2 mg/dL 0.4     Alkaline Phos 38 - 126 U/L 71     AST 15 - 41 U/L 28     ALT 0 - 44 U/L 25          Micro Results No results found for this or any previous visit (from the past 240 hours).  Radiology Reports No results found.   SIGNED: Adriana DELENA Grams, MD, FHM. FAAFP. [facility]ol[facility] - [facility] Time spent - 55 min.  In seeing, evaluating and examining the patient. Reviewing medical records, labs, drawn plan of care. [facility],  Pager (please use [email or web address] to page/ text) Please use Epic Secure Chat for non-urgent communication  (7AM-7PM)  If 7PM-7AM, please contact night-coverage [email or web address], 07/02/2023, 1:21 PM

## 2023-07-02 NOTE — Progress Notes (Signed)
 Progress Note  Patient Name: Garrett Roberts Date of Encounter: 07/02/2023 [facility] Cardiologist: [name], MD   Interval Summary   Breathing a little bit better today again.  Orthopnea has greatly improved  Vital Signs Vitals:   07/01/23 1610 07/01/23 1913 07/02/23 0400 07/02/23 0710  BP: (!) 88/64 107/68 (!) 83/61 106/76  Pulse: 64  67 67  Resp: 18 18 18 20   Temp: 97.7 F (36.5 C) 97.9 F (36.6 C) 98 F (36.7 C) 97.6 F (36.4 C)  TempSrc: Oral Oral Oral Oral  SpO2: 98% 100% 92% 100%  Weight:      Height:        Intake/Output Summary (Last 24 hours) at 07/02/2023 0952 Last data filed at 07/01/2023 2200 Gross per 24 hour  Intake 240 ml  Output 600 ml  Net -360 ml      07/01/2023    4:51 AM 06/30/2023    5:00 PM 06/30/2023   12:46 AM  Last 3 Weights  Weight (lbs) 140 lb 6.9 oz 139 lb 12.4 oz 150 lb  Weight (kg) 63.7 kg 63.4 kg 68.04 kg      Telemetry/ECG  Sinus rhythm- Personally Reviewed  Right heart catheterization 06/30/2023: Findings:   RA = 5 RV = 65/10 PA = 62/21 (35) PCW = 23 Fick cardiac output/index = 4.0/2.1 Thermo CO/CI = 3.9/2.1 PVR = 3.0 WU Ao sat = 99% PA sat = 68% PAPi = 8.2   Assessment: 1. Moderate to severely reduced CO 2. Volume overload 3. 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   Physical Exam  GEN: Thin in no acute distress Neck: No JVD Cardiac: RRR, no significant murmurs, rubs, or gallops.  Respiratory: Right base blunting breath sounds. GI: Soft, nontender, non-distended  MS: No edema  Assessment & Plan   Acute on chronic systolic heart failure secondary to ischemic cardiomyopathy with ejection fraction of 20% -Continue with diuresis -Right heart cath reviewed as above -Biotronik ICD 05/2023 -Jardiance  10 mg a day - Furosemide  40 mg IV daily -Midodrine  5 mg twice a day to support blood pressure -Albumin  12.5 g IV once previously ordered, blood pressures at times have been soft.  Does have a degree of  chronic hypotension.  CAD - Inferior STEMI back in 2023 DES to RCA also had an anterior STEMI in 2024 occluded proximal LAD treated with DES - Plavix , Eliquis  (no aspirin ) - Continue rosuvastatin  40 mg plus Zetia  10 mg with LDL goal less than 55  Paroxysmal atrial fibrillation - Currently normal sinus rhythm on telemetry continue with Eliquis  for chronic anticoagulation with no bleeding issues.  Watch closely with concomitant clopidogrel  - He is on amiodarone  200 mg a day also takes levothyroxine  75 mcg a day elevated TSH -Digoxin  0.125 mg daily -No changes made in medication management as above.  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  No known lung disease not a smoker chronic interstitial changes high-resolution CT of chest with bilateral bibasilar bronchiectasis may be due to aspiration although  swallowing study was negative.  VQ scan no chronic PE, rheumatologic studies negative. -Because of hypotension, holding tadalafil .  Pleural effusion - Had a right thoracentesis in March or 2025, transudative  GI significant weight loss - Likely secondary from low output heart failure.  Had several questions about long-term prognosis.  Discussed heart team approach with heart failure staff.  Reviewed prior notes from advanced  heart failure team.   For questions or updates, please contact [facility] Please consult [email or web address] for contact info under       Signed, Oneil Parchment, MD

## 2023-07-03 ENCOUNTER — Other Ambulatory Visit (HOSPITAL_COMMUNITY): Payer: Self-pay

## 2023-07-03 ENCOUNTER — Inpatient Hospital Stay (HOSPITAL_COMMUNITY)

## 2023-07-03 DIAGNOSIS — I5023 Acute on chronic systolic (congestive) heart failure: Secondary | ICD-10-CM | POA: Diagnosis not present

## 2023-07-03 DIAGNOSIS — Z01818 Encounter for other preprocedural examination: Secondary | ICD-10-CM | POA: Diagnosis not present

## 2023-07-03 DIAGNOSIS — I5020 Unspecified systolic (congestive) heart failure: Secondary | ICD-10-CM

## 2023-07-03 LAB — ECHOCARDIOGRAM COMPLETE
Height: 72 in
MV M vel: 3.81 m/s
MV Peak grad: 58.1 mmHg
MV VTI: 0.3 cm2
S' Lateral: 4.7 cm
Single Plane A4C EF: 25.5 %
Weight: 2246.93 [oz_av]

## 2023-07-03 LAB — COMPREHENSIVE METABOLIC PANEL WITH GFR
ALT: 29 U/L (ref 0–44)
AST: 35 U/L (ref 15–41)
Albumin: 3.4 g/dL — ABNORMAL LOW (ref 3.5–5.0)
Alkaline Phosphatase: 76 U/L (ref 38–126)
Anion gap: 10 (ref 5–15)
BUN: 23 mg/dL (ref 8–23)
CO2: 22 mmol/L (ref 22–32)
Calcium: 9 mg/dL (ref 8.9–10.3)
Chloride: 103 mmol/L (ref 98–111)
Creatinine, Ser: 1.22 mg/dL (ref 0.61–1.24)
GFR, Estimated: >60 mL/min
Glucose, Bld: 116 mg/dL — ABNORMAL HIGH (ref 70–99)
Potassium: 4.3 mmol/L (ref 3.5–5.1)
Sodium: 135 mmol/L (ref 135–145)
Total Bilirubin: 0.8 mg/dL (ref 0.0–1.2)
Total Protein: 6.5 g/dL (ref 6.5–8.1)

## 2023-07-03 LAB — CBC
HCT: 42.4 % (ref 39.0–52.0)
Hemoglobin: 13.5 g/dL (ref 13.0–17.0)
MCH: 28.7 pg (ref 26.0–34.0)
MCHC: 31.8 g/dL (ref 30.0–36.0)
MCV: 90.2 fL (ref 80.0–100.0)
Platelets: 236 10*3/uL (ref 150–400)
RBC: 4.7 MIL/uL (ref 4.22–5.81)
RDW: 14.5 % (ref 11.5–15.5)
WBC: 5.4 10*3/uL (ref 4.0–10.5)
nRBC: 0 % (ref 0.0–0.2)

## 2023-07-03 MED ORDER — TORSEMIDE 20 MG PO TABS
40.0000 mg | ORAL_TABLET | Freq: Two times a day (BID) | ORAL | Status: DC
  Administered 2023-07-03: 40 mg via ORAL
  Filled 2023-07-03 (×2): qty 2

## 2023-07-03 NOTE — Discharge Summary (Addendum)
 Progress note   Patient: Garrett Roberts MRN: [id number] DOB: 12/30/1955  Admit date:     06/30/2023  Discharge date: 07/03/23  Discharge Physician: Adriana DELENA Grams   PCP: Vincente Shivers, NP   Recommendations at discharge:    Follow-up with your cardiologist within 1 week Follow-up with the PCP over the 1-2 weeks Please make a note of change of all your medication including heart medicines Continue monitor daily weight, blood pressure.  Discharge Diagnoses: Principal Problem:   HFrEF (heart failure with reduced ejection fraction) (HCC) Active Problems:   Recurrent right pleural effusion   Chronic hypotension   CAD (coronary artery disease)   Atrial fibrillation (HCC)   Renal insufficiency   Type 2 diabetes mellitus with complication, without long-term current use of insulin  (HCC)   Hypothyroidism   GERD (gastroesophageal reflux disease)  Resolved Problems:   * No resolved hospital problems. *  Hospital Course: Garrett Roberts is a 68 y.o. with history of CAD, PAF, DM II, HTN, HLD, ischemic cardiomyopathy/chronic systolic CHF.    Patient had inferior STEMI 10/23 s/p PCI/DES to RCA. Had residual diffuse disease in diagonals and OMs. EF 40-45% at time of cath. Subsequent echo in 10/23 with EF 55-60%.  Echo 3/25 showed EF 25-30%   Presents with complaints of shortness of breath and chest pain, feet swelling, and when laying flat for the past 3 days.  ED:  He was afebrile with mild tachypnea with ambulation,  BUN 28, creatinine 148. BNP 1125.8, high-sensitivity troponin 18-> 19,  Chest x-ray noted mild right perihilar edema, small right pleural effusion, and associated right lower lobe opacity thought to be atelectasis.  Patient had been given Lasix  40 mg IV x 2 doses and 80 meq of potassium chloride .   Cardiology had been consulted and plan to take the patient for right heart cath.     HFrEF Pleural effusion - Hemodynamically stable, continue midodrine   - Acute  on chronic.  Patient presented with acute worsening shortness of breath and orthopne -  BNP was elevated at 1125.8. >>  1061.0  - Chest x-ray mild right perihilar edema, small Rt pleural effusion, Rt LL lobe opacity thought to be atelectasis.   Last echo; EF to be 25 to 30% with indeterminate diastolic parameters when checked 03/30/2022.   Patient status post Biotronik ICD in 05/2023.     Patient had been given Lasix  40 mg IV x 2 -switch back to oral torsemide   - Strict I&Os and daily weights Continue with diuresis - 06/30/2023 s/p right cardiac cath: Moderate-severely reduced CO, volume overload, mild-moderate mixed pulmonary hypertension  -Biotronik ICD 05/2023 -Jardiance  10 mg a day - Furosemide  40 mg IV daily-switch to p.o. torsemide  40 mg p.o. daily -Midodrine  5 mg twice a day to support blood pressure -Albumin  12.5 g IV X 2     Chronic hypotension -Hypotensive likely chronic -12.5 g of IV albumin - X 1 more dose today -Continue midodrine  at 5 mg p.o.     CAD -with history of stenting, on Plavix , Eliquis , Crestor , Zetia -no ASA    Paroxysmal atrial fibrillation  - In normal sinus rhythm,  - Will continue Digoxin , Amiodarone , Eliquis     Renal insufficiency  Baseline creatinine appears to range from 1.3-1.6 - Continue to monitor kidney function. -Creatinine of 1.28>> 1.26   Diabetes mellitus type 2, without long-term use of insulin  06/12/2023 last A1c 7.0,   - Continue Jardiance , SSI    Hypothyroidism TSH noted to be 22.402 on 6/30 for which patient  levothyroxine  was increased. - Commend outpatient follow-up of thyroid  studies in 2 to 4 weeks - Continue levothyroxine    GERD - Continue Protonix   Assessment and Plan: No notes have been filed under this hospital service. Service: Hospitalist        Consultants: Cardiology/heart failure team  Disposition: Home Diet recommendation:  Discharge Diet Orders (From admission, onward)     Start     Ordered    07/03/23 0000  Diet - low sodium heart healthy        07/03/23 0957           Cardiac diet DISCHARGE MEDICATION: Allergies as of 07/03/2023       Reactions   Beef-derived Drug Products    Pork-derived Products Other (See Comments)   Religious observance        Medication List     STOP taking these medications    MAGNESIUM PO   mirtazapine  15 MG tablet Commonly known as: Remeron    ondansetron  4 MG disintegrating tablet Commonly known as: ZOFRAN -ODT   spironolactone  25 MG tablet Commonly known as: ALDACTONE    tadalafil  20 MG tablet Commonly known as: CIALIS    VITAMIN C PO   zinc gluconate 50 MG tablet       TAKE these medications    acetaminophen  325 MG tablet Commonly known as: TYLENOL  Take 1-2 tablets (325-650 mg total) by mouth every 4 (four) hours as needed for mild pain (pain score 1-3). What changed:  how much to take when to take this reasons to take this   amiodarone  200 MG tablet Commonly known as: PACERONE  Take 1 tablet (200 mg total) by mouth daily.   apixaban  5 MG Tabs tablet Commonly known as: ELIQUIS  Take 1 tablet (5 mg total) by mouth 2 (two) times daily.   clopidogrel  75 MG tablet Commonly known as: PLAVIX  Take 1 tablet (75 mg total) by mouth daily.   digoxin  0.125 MG tablet Commonly known as: LANOXIN  Take 1 tablet (0.125 mg total) by mouth daily.   empagliflozin  10 MG Tabs tablet Commonly known as: Jardiance  Take 1 tablet (10 mg total) by mouth daily before breakfast.   ezetimibe  10 MG tablet Commonly known as: ZETIA  Take 1 tablet (10 mg total) by mouth daily.   levothyroxine  75 MCG tablet Commonly known as: SYNTHROID  Take 1 tablet (75 mcg total) by mouth daily.   linaclotide  145 MCG Caps capsule Commonly known as: Linzess  Take 1 capsule (145 mcg total) by mouth daily before breakfast.   melatonin 5 MG Tabs Take 5 mg by mouth at bedtime as needed (sleep).   midodrine  5 MG tablet Commonly known as:  PROAMATINE  Take 1 tablet (5 mg total) by mouth 3 (three) times daily with meals. What changed:  medication strength how much to take   pantoprazole  40 MG tablet Commonly known as: PROTONIX  Take 1 tablet (40 mg total) by mouth 2 (two) times daily before a meal.   potassium chloride  SA 20 MEQ tablet Commonly known as: KLOR-CON  M Take 2 tablets (40 mEq total) by mouth daily.   rosuvastatin  40 MG tablet Commonly known as: CRESTOR  Take 40 mg by mouth daily.   torsemide  20 MG tablet Commonly known as: DEMADEX  Take 2 tablets (40 mg total) by mouth 2 (two) times daily.   VITAMIN K2-VITAMIN D3 PO Take 1 tablet by mouth daily with lunch.        Discharge Exam: Filed Weights   06/30/23 0046 06/30/23 1700 07/01/23 0451  Weight: 68 kg 63.4  kg 63.7 kg        General:  AAO x 3,  cooperative, no distress;   HEENT:  Normocephalic, PERRL, otherwise with in Normal limits   Neuro:  CNII-XII intact. , normal motor and sensation, reflexes intact   Lungs:   Clear to auscultation BL, Respirations unlabored,  No wheezes / crackles  Cardio:    S1/S2, RRR, No murmure, No Rubs or Gallops   Abdomen:  Soft, non-tender, bowel sounds active all four quadrants, no guarding or peritoneal signs.  Muscular  skeletal:  Limited exam -global generalized weaknesses - in bed, able to move all 4 extremities,   2+ pulses,  symmetric, No pitting edema  Skin:  Dry, warm to touch, negative for any Rashes,  Wounds: Please see nursing documentation          Condition at discharge: good  The results of significant diagnostics from this hospitalization (including imaging, microbiology, ancillary and laboratory) are listed below for reference.   Imaging Studies: CARDIAC CATHETERIZATION Result Date: 07/02/2023 Findings: RA = 5 RV = 65/10 PA = 62/21 (35) PCW = 23 Fick cardiac output/index = 4.0/2.1 Thermo CO/CI = 3.9/2.1 PVR = 3.0 WU Ao sat = 99% PA sat = 68% PAPi = 8.2 Assessment: 1. Moderate to severely  reduced CO 2. Volume overload 3. 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 Plan/Discussion: Continue diuresis. Consider w/u for advanced therapies as outpatient Daniel Bensimhon, MD 3:37 PM  DG Chest 2 View Result Date: 06/30/2023 EXAM: 2 VIEW(S) XRAY OF THE CHEST 06/30/2023 01:04:00 AM COMPARISON: 06/02/2023 CLINICAL HISTORY: shob. Encounter for shortness of breath FINDINGS: LUNGS AND PLEURA: Small right pleural effusion. Associated right lower lobe opacity, likely atelectasis. Mild right perihilar edema. HEART AND MEDIASTINUM: No acute abnormality of the cardiac and mediastinal silhouettes. BONES AND SOFT TISSUES: No acute osseous abnormality. Left subclavian ICD in appropriate position. IMPRESSION: 1. Mild right perihilar edema. 2. Small right pleural effusion. Associated right lower lobe opacity, likely atelectasis. Electronically signed by: Pinkie Pebbles MD 06/30/2023 01:07 AM EDT RP Workstation: [id number]   DG Abd 1 View Result Date: 06/16/2023 CLINICAL DATA:  Evaluate stool burden EXAM: ABDOMEN - 1 VIEW COMPARISON:  None Available. FINDINGS: Moderate amount of residual fecal material throughout the colon without obstruction IMPRESSION: Moderate amount of residual fecal material throughout the colon without obstruction. Electronically Signed   By: Franky Chard M.D.   On: 06/16/2023 11:35    Microbiology: Results for orders placed or performed during the hospital encounter of 03/20/23  MRSA Next Gen by PCR, Nasal     Status: None   Collection Time: 03/20/23  4:54 PM   Specimen: Nasal Mucosa; Nasal Swab  Result Value Ref Range Status   MRSA by PCR Next Gen NOT DETECTED NOT DETECTED Final    Comment: (NOTE) The GeneXpert MRSA Assay (FDA approved for NASAL specimens only), is one component of a comprehensive MRSA colonization surveillance program. It is not intended to diagnose MRSA infection nor to guide or monitor treatment for MRSA infections. Test performance is not FDA approved in patients less than  20 years old. Performed at [facility], [address] [place], [place] [address]   Body fluid culture w Gram Stain     Status: None   Collection Time: 03/21/23  4:01 PM   Specimen: Pleural Fluid  Result Value Ref Range Status   Specimen Description PLEURAL  Final   Special Requests RIGHT LUNG  Final   Gram Stain   Final  NO WBC SEEN NO ORGANISMS SEEN Performed at [facility], [address] [place], [place] [address]    Culture   Final    RARE ESCHERICHIA COLI CRITICAL RESULT CALLED TO, READ BACK BY AND VERIFIED WITH: RN JUSTIN.G AT 1248 ON 03/23/2023 BY T.SAAD. Confirmed Extended Spectrum Beta-Lactamase Producer (ESBL).  In bloodstream infections from ESBL organisms, carbapenems are preferred over piperacillin/tazobactam. They are shown to have a lower risk of mortality.    Report Status 03/25/2023 FINAL  Final   Organism ID, Bacteria ESCHERICHIA COLI  Final      Susceptibility   Escherichia coli - MIC*    AMPICILLIN >=32 RESISTANT Resistant     CEFEPIME 16 RESISTANT Resistant     CEFTAZIDIME RESISTANT Resistant     CEFTRIAXONE  >=64 RESISTANT Resistant     CIPROFLOXACIN  >=4 RESISTANT Resistant     GENTAMICIN  >=16 RESISTANT Resistant     IMIPENEM <=0.25 SENSITIVE Sensitive     TRIMETH /SULFA  <=20 SENSITIVE Sensitive     AMPICILLIN/SULBACTAM >=32 RESISTANT Resistant     PIP/TAZO <=4 SENSITIVE Sensitive ug/mL    * RARE ESCHERICHIA COLI    Labs: CBC: Recent Labs  Lab 06/30/23 0054 06/30/23 1419 06/30/23 1420 06/30/23 1424 07/02/23 0255 07/03/23 0308  WBC 6.0  --   --   --  5.6 5.4  HGB 14.9 12.9* 15.6 15.6 14.0 13.5  HCT 47.3 38.0* 46.0 46.0 44.4 42.4  MCV 91.3  --   --   --  90.4 90.2  PLT 274  --   --   --  240 236   Basic Metabolic Panel: Recent Labs  Lab 06/30/23 0054 06/30/23 0619 06/30/23 1419 06/30/23 1420 06/30/23 1424 07/01/23 0316 07/02/23 0255 07/03/23 0308  NA 135  --    < > 138 138 139 137 135  K 3.6  --    < > 4.0 4.1 3.7  4.4 4.3  CL 97*  --   --   --   --  102 101 103  CO2 26  --   --   --   --  27 25 22   GLUCOSE 149*  --   --   --   --  98 178* 116*  BUN 28*  --   --   --   --  24* 25* 23  CREATININE 1.48*  --   --   --   --  1.28* 1.26* 1.22  CALCIUM  9.3  --   --   --   --  9.3 9.3 9.0  MG  --  2.5*  --   --   --  2.4  --   --    < > = values in this interval not displayed.   Liver Function Tests: Recent Labs  Lab 07/02/23 0255 07/03/23 0308  AST 28 35  ALT 25 29  ALKPHOS 71 76  BILITOT 0.4 0.8  PROT 6.6 6.5  ALBUMIN  3.4* 3.4*   CBG: Recent Labs  Lab 06/30/23 1459 07/02/23 2144  GLUCAP 137* 207*    Discharge time spent: greater than 45 minutes.  Signed: Adriana DELENA Grams, MD [facility] 07/03/2023

## 2023-07-03 NOTE — Evaluation (Signed)
 Physical Therapy Evaluation Patient Details Name: Garrett Roberts MRN: [id number] DOB: 1955-09-25 Today's Date: 07/03/2023  History of Present Illness  Pt is a 68 y/o male presenting with SOB and CP over 3 day period with associated swelling in his feet and orthopnea.  PMHx:  Ant wall STEMI, inferior wall STEMI, afib, CHF, CAD, DM, HTN, HLD, cardioversions, Coronary revascularizations via CATH, ICD  Clinical Impression  Pt admitted with/for SOB and CP.  Presently, pt is doing well and likely at his recent baseline of S to mod I.  Pt currently limited functionally due to the problems listed below.  (see problems list.)  Pt will benefit from PT to maximize function and safety to be able to get home safely with available assist.         If plan is discharge home, recommend the following: A little help with walking and/or transfers   Can travel by private vehicle        Equipment Recommendations None recommended by PT  Recommendations for Other Services       Functional Status Assessment Patient has had a recent decline in their functional status and demonstrates the ability to make significant improvements in function in a reasonable and predictable amount of time.     Precautions / Restrictions Precautions Precautions: None      Mobility  Bed Mobility               General bed mobility comments: NT OOB on arrival    Transfers Overall transfer level: Modified independent Equipment used: None                    Ambulation/Gait Ambulation/Gait assistance: Supervision Gait Distance (Feet): 270 Feet (100. feet then a standing rest after stairs.  standing rest to recover x2) Assistive device: None Gait Pattern/deviations: Step-through pattern   Gait velocity interpretation: 1.31 - 2.62 ft/sec, indicative of limited community ambulator   General Gait Details: generally steady, but slow with short stride length.  VSS overall during the gait  trial.s  Stairs Stairs: Yes Stairs assistance: Modified independent (Device/Increase time) Stair Management: One rail Right, Alternating pattern, Forwards Number of Stairs: 10 General stair comments: safe with the rails  Wheelchair Mobility     Tilt Bed    Modified Rankin (Stroke Patients Only)       Balance Overall balance assessment: Needs assistance, Modified Independent, Mild deficits observed, not formally tested   Sitting balance-Leahy Scale: Good     Standing balance support: No upper extremity supported Standing balance-Leahy Scale: Good                               Pertinent Vitals/Pain Pain Assessment Pain Assessment: No/denies pain    Home Living Family/patient expects to be discharged to:: Private residence Living Arrangements: Spouse/significant other Available Help at Discharge: Family;Available 24 hours/day;Available PRN/intermittently Type of Home: House Home Access: Stairs to enter       Home Layout: Two level;1/2 bath on main level;Bed/bath upstairs Home Equipment: None      Prior Function Prior Level of Function : Independent/Modified Independent                     Extremity/Trunk Assessment   Upper Extremity Assessment Upper Extremity Assessment: Defer to OT evaluation    Lower Extremity Assessment Lower Extremity Assessment: Overall WFL for tasks assessed    Cervical / Trunk Assessment Cervical / Trunk Assessment:  Normal  Communication   Communication Communication: No apparent difficulties    Cognition Arousal: Alert Behavior During Therapy: WFL for tasks assessed/performed   PT - Cognitive impairments: No apparent impairments                                 Cueing Cueing Techniques: Verbal cues     General Comments General comments (skin integrity, edema, etc.): mobility steady, Sats functtional, but pt easily fatigues.  Pt awaiting the LVAD team for assessment and information.     Exercises     Assessment/Plan    PT Assessment Patient needs continued PT services  PT Problem List Decreased strength;Decreased activity tolerance;Decreased coordination       PT Treatment Interventions DME instruction;Gait training;Stair training;Functional mobility training;Therapeutic activities;Balance training;Patient/family education    PT Goals (Current goals can be found in the Care Plan section)  Acute Rehab PT Goals Patient Stated Goal: Ultimately Independent, find out about after the LVAD PT Goal Formulation: With patient Time For Goal Achievement: 07/17/23 Potential to Achieve Goals: Good    Frequency Min 3X/week     Co-evaluation               AM-PAC PT 6 Clicks Mobility  Outcome Measure Help needed turning from your back to your side while in a flat bed without using bedrails?: None Help needed moving from lying on your back to sitting on the side of a flat bed without using bedrails?: None Help needed moving to and from a bed to a chair (including a wheelchair)?: A Little Help needed standing up from a chair using your arms (e.g., wheelchair or bedside chair)?: A Little Help needed to walk in hospital room?: A Little Help needed climbing 3-5 steps with a railing? : A Little 6 Click Score: 20    End of Session     Patient left: in chair Nurse Communication: Mobility status PT Visit Diagnosis: Other abnormalities of gait and mobility (R26.89);Difficulty in walking, not elsewhere classified (R26.2)    Time: 8756-8697 PT Time Calculation (min) (ACUTE ONLY): 19 min   Charges:   PT Evaluation $PT Eval Moderate Complexity: 1 Mod   PT General Charges $$ ACUTE PT VISIT: 1 Visit         07/03/2023  [name], PT Acute Rehabilitation Services [phone]  (office)  Vinie GAILS Marveen Donlon 07/03/2023, 1:33 PM

## 2023-07-03 NOTE — Progress Notes (Addendum)
 Advanced Heart Failure Rounding Note  Cardiologist: Garrett JINNY Lawrence, MD  Chief Complaint: A/C HFrEF Subjective:    Has been diuresing well over the weekend though I&O doesn't appear accurate. Not weighing daily.   SCr 1.22. On lasix  40 mg daily  Continues to feel better. Reports good UOP over the weekend. Denies CP/SOB.    Objective:   Weight Range: 63.7 kg Body mass index is 19.05 kg/m.   Vital Signs:   Temp:  [97.3 F (36.3 C)-98.3 F (36.8 C)] 98 F (36.7 C) (06/23 0705) Pulse Rate:  [64-73] 73 (06/23 0705) Resp:  [18-24] 20 (06/23 0705) BP: (97-117)/(62-86) 101/62 (06/23 0705) SpO2:  [98 %-100 %] 100 % (06/23 0705) Last BM Date : 07/02/23  Weight change: Filed Weights   06/30/23 0046 06/30/23 1700 07/01/23 0451  Weight: 68 kg 63.4 kg 63.7 kg    Intake/Output:   Intake/Output Summary (Last 24 hours) at 07/03/2023 0732 Last data filed at 07/02/2023 2147 Gross per 24 hour  Intake 123 ml  Output --  Net 123 ml      Physical Exam  General:  elderly appearing.  No respiratory difficulty Neck:  JVD flat.  Cor: PMI nondisplaced. Regular rate & rhythm. No rubs, gallops or murmurs. Lungs: diminished Extremities: no cyanosis, clubbing, rash, edema. Cool BLE.  Neuro: alert & oriented x 3. Moves all 4 extremities w/o difficulty. Affect pleasant.   Telemetry   NSR 60s (Personally reviewed)    EKG    No new EKG to review  Labs    CBC Recent Labs    07/02/23 0255 07/03/23 0308  WBC 5.6 5.4  HGB 14.0 13.5  HCT 44.4 42.4  MCV 90.4 90.2  PLT 240 236   Basic Metabolic Panel Recent Labs    93/78/74 0316 07/02/23 0255 07/03/23 0308  NA 139 137 135  K 3.7 4.4 4.3  CL 102 101 103  CO2 27 25 22   GLUCOSE 98 178* 116*  BUN 24* 25* 23  CREATININE 1.28* 1.26* 1.22  CALCIUM  9.3 9.3 9.0  MG 2.4  --   --    Liver Function Tests Recent Labs    07/02/23 0255 07/03/23 0308  AST 28 35  ALT 25 29  ALKPHOS 71 76  BILITOT 0.4 0.8  PROT 6.6 6.5   ALBUMIN  3.4* 3.4*   No results for input(s): LIPASE, AMYLASE in the last 72 hours. Cardiac Enzymes No results for input(s): CKTOTAL, CKMB, CKMBINDEX, TROPONINI in the last 72 hours.  BNP: BNP (last 3 results) Recent Labs    06/13/23 0936 06/30/23 0054 07/01/23 1153  BNP 1,435.9* 1,125.8* 1,061.0*    ProBNP (last 3 results) No results for input(s): PROBNP in the last 8760 hours.   D-Dimer No results for input(s): DDIMER in the last 72 hours. Hemoglobin A1C No results for input(s): HGBA1C in the last 72 hours. Fasting Lipid Panel No results for input(s): CHOL, HDL, LDLCALC, TRIG, CHOLHDL, LDLDIRECT in the last 72 hours. Thyroid  Function Tests No results for input(s): TSH, T4TOTAL, T3FREE, THYROIDAB in the last 72 hours.  Invalid input(s): FREET3  Other results:   Imaging    No results found.   Medications:     Scheduled Medications:  amiodarone   200 mg Oral Daily   apixaban   5 mg Oral BID   clopidogrel   75 mg Oral Daily   digoxin   0.125 mg Oral Daily   empagliflozin   10 mg Oral QAC breakfast   ezetimibe   10 mg Oral  Daily   furosemide   40 mg Intravenous Daily   levothyroxine   75 mcg Oral QAC breakfast   linaclotide   145 mcg Oral QAC breakfast   midodrine   5 mg Oral TID WC   pantoprazole   40 mg Oral BID AC   potassium chloride  SA  40 mEq Oral Daily   rosuvastatin   40 mg Oral Daily   sodium chloride  flush  3 mL Intravenous Q12H    Infusions:   PRN Medications: melatonin    Patient Profile   5 y.op. male with history of chronic systolic CHF/ICM, CAD, DM II, HTN, PAF. Presenting with acute on chronic CHF.   Assessment/Plan  1. 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 Echo 2/25 with EF 25%, mildly reduced RV function.  Low BP has limited GDMT titration.  His profound fatigue has been  concerning for low output HF. RHC 3/25 showed low CI by thermo (2.05) but preserved by Fick.  Borderline elevated PCWP, normal RA pressure.  There was also moderate mixed pulmonary arterial/pulmonary venous hypertension.  With his significant symptoms, he was started on milrinone  0.25 and added sildenafil  to try to lower PA pressure.  Milrinone  was later weaned off with medication adjustment, and co-ox remained excellent.  Repeat echo 3/25 showed EF 25-30%, mild RV dysfunction, moderate MR, PASP 62 mmHg. Dr. Rolan discussed LVAD during last admission, need to follow him very closely.  - RHC 06/30/23 RA 5, PA 62/21 (35), Fick CO/CI 4/2.1, TD CO/CI 3.9/2.1, PVR 3 WU, PAPi 8.2. Mod- sev reduced CO, mild-mod mixed PH.  - Presenting with worsening dyspnea and fatigue, NYHA IIIb - Appears euvolemic. Needs daily weights and strict I&O. Plan to start PO diuretics today.  - Off all GDMT except jardiance  and digoxin  d/t hypotension. On midodrine .  - s/p Biotronik ICD 5/25. - Unsure of route he wants to take at this time. Meds vs LVAD vs heart transplant. All are options. We will have the LVAD coordinator come talk to him today.    2. CAD:  Hx inferior STEMI 10/23 s/p PCI/DES to RCA. Anterior STEMI 12/10/22, cath showed occluded pLAD treated with PCI/DES, prior RCA stent patent, severe disease OM1 and distal LCx treated medically. No chest pain.  - Continue Plavix .   - No ASA given Eliquis  use.  - Continue rosuvastatin  40 mg + Zetia  10 mg daily.   3. Atrial fibrillation: Paroxysmal. NSR on tele. - Continue Eliquis  5 mg bid. No bleeding issues. - Continue amiodarone  200 mg daily, amiodarone  labs are UTD. Levothyroxine  recently increased to 75 mg daily d/t elevated TSH. He will need a regular eye exam.    4. DM II: Per PCP. Most recent A1C 7.0 - No change.   5. 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 Not a smoker, no known lung disease. CXR has shown some  chronic interstitial changes. HRCT chest with bibasilar bronchiectasis that may be due to aspiration (though swallow study negative).  V/Q scan showed no chronic PE. Rheumatologic serologies negative - Hold tadalafil  with hypotension.   6. Pleural effusion: S/p right thoracentesis 3/25. Pleural fluid was transudative by Light's criteria, no malignant cells. However, it grew E coli.  - Recent CXR (06/02/23) shows small to moderate right pleural effusion. May need to revisit thoracentesis vs push diuretics if renal function allows.   7. GI: Has had significant weight loss, early satiety, GI discomfort.  CT  abdomen/pelvis with gastric wall thickening. GI has seen and EGD was performed, no definite evidence for malignancy, biopsies taken for H pylori testing and pathology => negative for H pylori and no gastric cancer.  - Heart failure may play a significant role in the symptoms.  - Continue PPI - Continue outpatient GI follow up  Length of Stay: 2  Garrett LITTIE Coe, NP  07/03/2023, 7:32 AM  Advanced Heart Failure Team Pager [phone] (M-F; 7a - 5p)  Please contact [facility] for night-coverage after hours (5p -7a ) and weekends on [email or web address]

## 2023-07-03 NOTE — Progress Notes (Signed)
 MCS EDUCATION NOTE:                VAD evaluation consent reviewed and signed by Garrett Roberts and designated caregiver Garrett Roberts (pt's daughter).  Initial VAD teaching completed with pt and caregiver.   VAD educational packet including Understanding Your Options with Advanced Heart Failure, [place] Patient Agreement for VAD Evaluation and Potential Implantation consent, and Abbott Heartmate 3 Left Ventricular Device (LVAD) Patient Guide, [place] HM III Patient Education, [place] Mechanical Circulatory Support Program, and Decision Aids for Left Ventricular Assist Device reviewed in detail and left at bedside for continued reference.   All questions answered regarding VAD implant, hospital stay, and what to expect when discharged home living with a heart pump.   Pt identified his wife as his primary caregiver and his daughters as back-up if this therapy should be deemed appropriate.  Explained need for 24/7 care when pt is discharged home due to sternal precautions, adaptation to living on support, emotional support, consistent and meticulous exit site care and management, medication adherence and high volume of follow up visits with the VAD Clinic after discharge; both pt and caregiver verbalized understanding of above.   Explained that LVAD can be implanted for two indications in the setting of advanced left ventricular heart failure treatment:  Bridge to transplant - used for patients who cannot safely wait for heart transplant without this device.  Or    Destination therapy - used for patients until end of life or recovery of heart function.  Patient and caregiver(s) acknowledge that the indication at this point in time for LVAD therapy would be for destination therapy due to age.   Provided brief equipment overview and demonstration with HeartMate III training loop including discussion on the following:   a) mobile power unit b) system controller   c) universal Magazine features editor    d) battery clips   e) Batteries   f)  Perc lock   g) Percutaneous lead   Reviewed and supplied a copy of home inspection check list stressing that only three pronged grounded power outlets can be used for VAD equipment. Garrett Roberts confirmed home has electrical outlets that will support the equipment along with access working telephone.  Identified the following lifestyle modifications while living on MCS:    1. No driving for at least three months and then only if doctor gives permission to do so.   2. No tub baths while pump implanted, and shower only when doctor gives permission.   3. No swimming or submersion in water while implanted with pump.   4. No contact sports or engaging in jumping activities.   5. Always have a backup controller, charged spare batteries, and battery clips nearby at all times in case of emergency.   6. Call the doctor or hospital contact person if any change in how the pump sounds, feels, or works.   7. Plan to sleep only when connected to the power module.   8. Do not sleep on your stomach.   9. Keep a backup system controller, charged batteries, battery clips, and flashlight near you during sleep in case of electrical power outage.   10. Exit site care including dressing changes, monitoring for infection, and importance of keeping percutaneous lead stabilized at all times.     Extended the option to have one of our current patients and caregiver(s) come to talk with them about living on support to assist with decision making.   Reviewed pictures of VAD drive  line, site care, dressing changes, and drive line stabilization including securement attachment device and abdominal binder. Discussed with pt and family that they will be required to purchase dressing supplies as long as patient has the VAD in place.   Reinforced need for 24 hour/7 day week caregivers. He will also need to abide by sternal precautions with no lifting >10lbs, pushing, pulling and will need  assistance with adapting to new life style with VAD equipment and care.   Intermacs patient survival statistics through December 2024 reviewed with patient and caregiver as follows:    The patient understands that from this discussion it does not mean that they will receive the device, but that depends on an extensive evaluation process. The patient is aware of the fact that if at anytime they want to stop the evaluation process they can.  All questions have been answered at this time and contact information was provided should they encounter any further questions. They are both agreeable at this time to the evaluation process and will move forward.    Total Session Time: 60 minutes  Garrett Knoll RN VAD Coordinator  Office: [phone]  24/7 Pager: [phone]

## 2023-07-03 NOTE — Progress Notes (Signed)
  Echocardiogram 2D Echocardiogram has been performed.  Garrett Roberts 07/03/2023, 3:00 PM

## 2023-07-03 NOTE — TOC Initial Note (Addendum)
 Transition of Care [facility]) - Initial/Assessment Note    Patient Details  Name: Garrett Roberts MRN: [id number] Date of Birth: 27-Dec-1955  Transition of [facility] CM/SW Contact:    Justina Delcia Czar, RN Phone Number: [phone] 07/03/2023, 10:18 AM  Clinical Narrative:      LVAD work-up            CM spoke to pt and dtr, Garrett. Gave permission to speak to daughters. Pt lives at home with his dtrs.  Drives to appts. Has scale at home for daily weights. States he has Living Better with HF booklet at home. Adheres to a heart healthy/low sodium diet.  Pt was independent pta.  Will continue to follow for dc needs.     Expected Discharge Plan: Home/Self Care Barriers to Discharge: No Barriers Identified   Patient Goals and CMS Choice Patient states their goals for this hospitalization and ongoing recovery are:: wants to remain independent          Expected Discharge Plan and Services   Discharge Planning Services: CM Consult   Living arrangements for the past 2 months: Single Family Home Expected Discharge Date: 07/03/23                                    Prior Living Arrangements/Services Living arrangements for the past 2 months: Single Family Home Lives with:: Adult Children Patient language and need for interpreter reviewed:: Yes Do you feel safe going back to the place where you live?: Yes      Need for Family Participation in Patient Care: No (Comment) Care giver support system in place?: Yes (comment) Current home services: DME (scale) Criminal Activity/Legal Involvement Pertinent to Current Situation/Hospitalization: No - Comment as needed  Activities of Daily Living   ADL Screening (condition at time of admission) Independently performs ADLs?: No Does the patient have a NEW difficulty with bathing/dressing/toileting/self-feeding that is expected to last >3 days?: No Does the patient have a NEW difficulty with getting in/out of bed, walking, or  climbing stairs that is expected to last >3 days?: No Does the patient have a NEW difficulty with communication that is expected to last >3 days?: No Is the patient deaf or have difficulty hearing?: No Does the patient have difficulty seeing, even when wearing glasses/contacts?: No Does the patient have difficulty concentrating, remembering, or making decisions?: No  Permission Sought/Granted Permission sought to share information with : Case Manager, Family Supports, PCP Permission granted to share information with : Yes, Verbal Permission Granted  Share Information with NAME: Garrett Roberts  Permission granted to share info w AGENCY: PCP  Permission granted to share info w Relationship: daughter  Permission granted to share info w Contact Information: [phone]  Emotional Assessment   Attitude/Demeanor/Rapport: Engaged Affect (typically observed): Accepting Orientation: : Oriented to Self, Oriented to Place, Oriented to  Time, Oriented to Situation   Psych Involvement: No (comment)  Admission diagnosis:  SOB (shortness of breath) [R06.02] HFrEF (heart failure with reduced ejection fraction) (HCC) [I50.20] Patient Active Problem List   Diagnosis Date Noted   HFrEF (heart failure with reduced ejection fraction) (HCC) 06/30/2023   Recurrent right pleural effusion 06/30/2023   Chronic hypotension 06/30/2023   Renal insufficiency 06/30/2023   Hypothyroidism 06/30/2023   GERD (gastroesophageal reflux disease) 06/30/2023   Presence of heart assist device (HCC) 06/12/2023   Chronic systolic heart failure (HCC) 06/01/2023   Poor appetite  04/19/2023   Hospital discharge follow-up 04/05/2023   Subclinical hypothyroidism 04/05/2023   Loss of weight 03/23/2023   Protein-calorie malnutrition, severe 03/22/2023   Acute on chronic systolic CHF (congestive heart failure) (HCC) 02/17/2023   Ischemic cardiomyopathy 12/19/2022   Chronic HFrEF (heart failure with reduced ejection fraction) (HCC)  12/19/2022   CKD (chronic kidney disease), 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   Type 2 diabetes mellitus with complication, without long-term current use of insulin  (HCC) 12/15/2022   Atrial fibrillation (HCC) 12/14/2022   Constipation 12/12/2022   Left sided abdominal pain 12/11/2022   Abnormal finding on GI tract imaging 12/11/2022   CAD (coronary artery disease) 10/11/2022   Essential hypertension 10/11/2022   Hyperlipidemia 10/11/2022   Vitamin D deficiency 08/03/2015   PCP:  Vincente Shivers, NP Pharmacy:   [facility] [address], [address] [place] [address] Phone: [phone] Fax: [phone]     Social Drivers of Health (SDOH) Social History: SDOH Screenings   Food Insecurity: No Food Insecurity (06/30/2023)  Housing: Low Risk  (06/30/2023)  Transportation Needs: No Transportation Needs (06/30/2023)  Utilities: Not At Risk (06/30/2023)  Alcohol Screen: Low Risk  (12/13/2022)  Depression (PHQ2-9): Low Risk  (06/12/2023)  Financial Resource Strain: High Risk (12/13/2022)  Physical Activity: Sufficiently Active (11/27/2019)   Received from [facility]  Social Connections: Socially Integrated (06/30/2023)  Stress: Stress Concern Present (11/27/2019)   Received from [facility]  Tobacco Use: Low Risk  (06/30/2023)   SDOH Interventions:     Readmission Risk Interventions     No data to display

## 2023-07-03 NOTE — Progress Notes (Signed)
 RT attempted twice to obtain ABG unsuccessfully, MD notified.

## 2023-07-04 ENCOUNTER — Inpatient Hospital Stay (HOSPITAL_COMMUNITY)

## 2023-07-04 ENCOUNTER — Other Ambulatory Visit (HOSPITAL_COMMUNITY): Payer: Self-pay

## 2023-07-04 DIAGNOSIS — Z515 Encounter for palliative care: Secondary | ICD-10-CM

## 2023-07-04 DIAGNOSIS — Z01810 Encounter for preprocedural cardiovascular examination: Secondary | ICD-10-CM

## 2023-07-04 DIAGNOSIS — I5023 Acute on chronic systolic (congestive) heart failure: Secondary | ICD-10-CM | POA: Diagnosis not present

## 2023-07-04 DIAGNOSIS — I255 Ischemic cardiomyopathy: Secondary | ICD-10-CM

## 2023-07-04 DIAGNOSIS — Z7189 Other specified counseling: Secondary | ICD-10-CM | POA: Diagnosis not present

## 2023-07-04 DIAGNOSIS — I2510 Atherosclerotic heart disease of native coronary artery without angina pectoris: Secondary | ICD-10-CM

## 2023-07-04 DIAGNOSIS — I5020 Unspecified systolic (congestive) heart failure: Secondary | ICD-10-CM | POA: Diagnosis not present

## 2023-07-04 LAB — LIPID PANEL
Cholesterol: 115 mg/dL (ref 0–200)
HDL: 46 mg/dL (ref >40)
LDL Cholesterol: 58 mg/dL (ref 0–99)
Total CHOL/HDL Ratio: 2.5 ratio
Triglycerides: 55 mg/dL (ref <150)
VLDL: 11 mg/dL (ref 0–40)

## 2023-07-04 LAB — VAS US DOPPLER PRE VAD
Left ABI: 1.13
Right ABI: 1.1

## 2023-07-04 LAB — URINALYSIS, ROUTINE W REFLEX MICROSCOPIC
Bacteria, UA: NONE SEEN
Bilirubin Urine: NEGATIVE
Glucose, UA: >=500 mg/dL — AB
Hgb urine dipstick: NEGATIVE
Ketones, ur: NEGATIVE mg/dL
Leukocytes,Ua: NEGATIVE
Nitrite: NEGATIVE
Protein, ur: NEGATIVE mg/dL
Specific Gravity, Urine: 1.007 (ref 1.005–1.030)
pH: 5 (ref 5.0–8.0)

## 2023-07-04 LAB — COMPREHENSIVE METABOLIC PANEL WITH GFR
ALT: 28 U/L (ref 0–44)
AST: 27 U/L (ref 15–41)
Albumin: 3.6 g/dL (ref 3.5–5.0)
Alkaline Phosphatase: 68 U/L (ref 38–126)
Anion gap: 11 (ref 5–15)
BUN: 25 mg/dL — ABNORMAL HIGH (ref 8–23)
CO2: 28 mmol/L (ref 22–32)
Calcium: 9.3 mg/dL (ref 8.9–10.3)
Chloride: 102 mmol/L (ref 98–111)
Creatinine, Ser: 1.32 mg/dL — ABNORMAL HIGH (ref 0.61–1.24)
GFR, Estimated: 59 mL/min — ABNORMAL LOW (ref >60)
Glucose, Bld: 131 mg/dL — ABNORMAL HIGH (ref 70–99)
Potassium: 4.3 mmol/L (ref 3.5–5.1)
Sodium: 141 mmol/L (ref 135–145)
Total Bilirubin: 0.5 mg/dL (ref 0.0–1.2)
Total Protein: 6.6 g/dL (ref 6.5–8.1)

## 2023-07-04 LAB — RAPID URINE DRUG SCREEN, HOSP PERFORMED
Amphetamines: NOT DETECTED
Barbiturates: NOT DETECTED
Benzodiazepines: NOT DETECTED
Cocaine: NOT DETECTED
Opiates: NOT DETECTED
Tetrahydrocannabinol: NOT DETECTED

## 2023-07-04 LAB — HEPATITIS C ANTIBODY: HCV Ab: NONREACTIVE

## 2023-07-04 LAB — TYPE AND SCREEN
ABO/RH(D): A POS
Antibody Screen: NEGATIVE

## 2023-07-04 LAB — ABO/RH: ABO/RH(D): A POS

## 2023-07-04 LAB — CBC
HCT: 44 % (ref 39.0–52.0)
Hemoglobin: 14.1 g/dL (ref 13.0–17.0)
MCH: 28.6 pg (ref 26.0–34.0)
MCHC: 32 g/dL (ref 30.0–36.0)
MCV: 89.2 fL (ref 80.0–100.0)
Platelets: 260 10*3/uL (ref 150–400)
RBC: 4.93 MIL/uL (ref 4.22–5.81)
RDW: 14.6 % (ref 11.5–15.5)
WBC: 4.7 10*3/uL (ref 4.0–10.5)
nRBC: 0 % (ref 0.0–0.2)

## 2023-07-04 LAB — HEPATITIS B SURFACE ANTIGEN: Hepatitis B Surface Ag: NONREACTIVE

## 2023-07-04 LAB — PSA: Prostatic Specific Antigen: 0.52 ng/mL (ref 0.00–4.00)

## 2023-07-04 LAB — HEPATITIS B SURFACE ANTIBODY,QUALITATIVE: Hep B S Ab: NONREACTIVE

## 2023-07-04 LAB — URIC ACID: Uric Acid, Serum: 5.1 mg/dL (ref 3.7–8.6)

## 2023-07-04 LAB — PROTIME-INR
INR: 2.1 — ABNORMAL HIGH (ref 0.8–1.2)
Prothrombin Time: 23.4 s — ABNORMAL HIGH (ref 11.4–15.2)

## 2023-07-04 LAB — LACTATE DEHYDROGENASE: LDH: 173 U/L (ref 98–192)

## 2023-07-04 LAB — ANTITHROMBIN III: AntiThromb III Func: 85 % (ref 75–120)

## 2023-07-04 LAB — PREALBUMIN: Prealbumin: 19 mg/dL (ref 18–38)

## 2023-07-04 LAB — APTT: aPTT: 34 s (ref 24–36)

## 2023-07-04 MED ORDER — TORSEMIDE 20 MG PO TABS
40.0000 mg | ORAL_TABLET | Freq: Every day | ORAL | Status: DC
  Administered 2023-07-04 – 2023-07-05 (×2): 40 mg via ORAL
  Filled 2023-07-04 (×2): qty 2

## 2023-07-04 MED ORDER — GLUCERNA SHAKE PO LIQD
237.0000 mL | Freq: Three times a day (TID) | ORAL | Status: DC
  Administered 2023-07-04 – 2023-07-05 (×2): 237 mL via ORAL

## 2023-07-04 MED ORDER — EMPAGLIFLOZIN 10 MG PO TABS
10.0000 mg | ORAL_TABLET | Freq: Every day | ORAL | Status: DC
  Administered 2023-07-05: 10 mg via ORAL
  Filled 2023-07-04: qty 1

## 2023-07-04 NOTE — Consult Note (Signed)
 Consultation Note Date: 07/04/2023   Patient Name: Garrett Roberts  DOB: December 17, 1955  MRN: [id number]  Age / Sex: 68 y.o., male  PCP: Vincente Shivers, NP Referring Physician: Willette Adriana LABOR, MD  Reason for Consultation: VAD evaluation  HPI/Patient Profile: 68 y.o. male  with past medical history of HFrEF EF 25%, ICM, CAD, inferior STEMI 10/2021 s/p DES to RCA, anterior STEMI 11/2022 s/p DES to LAD, CKD stage 2, DM2, HTN, HLD, PAF admitted on 06/30/2023 with shortness of breath and chest pain due to acute on chronic systolic heart failure. Work up for LVAD ongoing.   Clinical Assessment and Goals of Care: Consult received and chart review completed. I met today with Garrett Roberts and daughter at bedside. He is sitting up in chair. Very pleasant and open to conversation. He has been receiving information and education about LVAD. He tells me that he is not sure what he wants. We reviewed options with medical management (knowing we are very limited on what more we can do medically to support him), LVAD, and work up for transplant. He very clearly shares that he does not desire heart transplant. He is considering LVAD but is hesitant and unsure if he wants to go through the surgery and procedure.   He has wife and 5 adult children all very support. Some of his children are in the home with him. Family are very supportive of his decisions. There are many that can assist with care if he decides to pursue LVAD. One daughter very much wishes for him to pursue the LVAD. Daughter at bedside now shares that she will respect whatever decision he makes.   One of Garrett Roberts main concerns with LVAD is that he enjoys visiting his family in [place]. They know that going to [place] with LVAD is not a good idea as they have limited health resources there as well as unreliable electricity to charge batteries. I explained that if this is  his main concern I worry that his ability to go and visit family will be impacted by his poor prognosis with progressing heart failure as well so either way his visits will be limited. I encouraged him to consider this. We discussed the importance of quality vs quantity of life. We discussed the hope that LVAD would give him both more time and improved quality of life. He will continue to consider his options.   He was taken for test. We agree to speak more tomorrow.   Primary Decision Maker PATIENT    SUMMARY OF RECOMMENDATIONS   - Ongoing LVAD evaluation - Appears to be a good candidate if he decides he wishes to pursue - He is currently undecided if he wishes to pursue LVAD - He does not wish to pursue heart transplant  Code Status/Advance Care Planning: Full code   Symptom Management:  Per heart failure team, attending  Prognosis:  Overall prognosis poor with advancing heart failure - this is why LVAD is being considered.   Discharge Planning: Home with Home Health  Primary Diagnoses: Present on Admission:  HFrEF (heart failure with reduced ejection fraction) (HCC)  Recurrent right pleural effusion  Chronic hypotension  CAD (coronary artery disease)  Atrial fibrillation (HCC)  Type 2 diabetes mellitus with complication, without long-term current use of insulin  (HCC)  Hypothyroidism  GERD (gastroesophageal reflux disease)   I have reviewed the medical record, interviewed the patient and family, and examined the patient. The following aspects are pertinent.  Past Medical History:  Diagnosis Date   Acute ST elevation myocardial infarction (STEMI) of anterior wall (HCC) 12/10/2022   Acute ST elevation myocardial infarction (STEMI) of inferior wall (HCC) 10/30/2021   Atrial fibrillation (HCC)    Atrial fibrillation with RVR (HCC) 12/19/2022   CHF (congestive heart failure) (HCC)    Coronary artery disease    Diabetes mellitus without complication (HCC)    DM  (diabetes mellitus) (HCC)    H. pylori infection    Heart attack (HCC)    HLD (hyperlipidemia)    Hypertension    ST elevation myocardial infarction involving left anterior descending (LAD) coronary artery (HCC) 12/10/2022   Social History   Socioeconomic History   Marital status: Married    Spouse name: Amani   Number of children: 5   Years of education: Not on file   Highest education level: High school graduate  Occupational History   Occupation: part time  Tobacco Use   Smoking status: Never   Smokeless tobacco: Never  Vaping Use   Vaping status: Never Used  Substance and Sexual Activity   Alcohol use: No   Drug use: No   Sexual activity: Not on file  Other Topics Concern   Not on file  Social History Narrative   ** Merged History Encounter **       Social Drivers of Health   Financial Resource Strain: High Risk (12/13/2022)   Overall Financial Resource Strain (CARDIA)    Difficulty of Paying Living Expenses: Very hard  Food Insecurity: No Food Insecurity (06/30/2023)   Hunger Vital Sign    Worried About Running Out of Food in the Last Year: Never true    Ran Out of Food in the Last Year: Never true  Transportation Needs: No Transportation Needs (06/30/2023)   PRAPARE - Administrator, Civil Service (Medical): No    Lack of Transportation (Non-Medical): No  Physical Activity: Sufficiently Active (11/27/2019)   Received from [facility]   Exercise Vital Sign    Days of Exercise per Week: 4 days    Minutes of Exercise per Session: 60 min  Stress: Stress Concern Present (11/27/2019)   Received from [facility] of Occupational Health - Occupational Stress Questionnaire    Feeling of Stress : To some extent  Social Connections: Socially Integrated (06/30/2023)   Social Connection and Isolation Panel    Frequency of Communication with Friends and Family: More than three times a week    Frequency of Social Gatherings with Friends  and Family: More than three times a week    Attends Religious Services: More than 4 times per year    Active Member of [employer] or Organizations: No    Attends Engineer, structural: More than 4 times per year    Marital Status: Married   Family History  Problem Relation Age of Onset   Diabetes Mother    Chronic Renal Failure Father    Diabetes Father    Chronic Renal Failure Brother    Diabetes Brother  Diabetes Brother    Heart attack Brother    Stroke Brother    Scheduled Meds:  amiodarone   200 mg Oral Daily   apixaban   5 mg Oral BID   clopidogrel   75 mg Oral Daily   digoxin   0.125 mg Oral Daily   [START ON 07/05/2023] empagliflozin   10 mg Oral QAC breakfast   ezetimibe   10 mg Oral Daily   levothyroxine   75 mcg Oral QAC breakfast   linaclotide   145 mcg Oral QAC breakfast   midodrine   5 mg Oral TID WC   pantoprazole   40 mg Oral BID AC   potassium chloride  SA  40 mEq Oral Daily   rosuvastatin   40 mg Oral Daily   sodium chloride  flush  3 mL Intravenous Q12H   torsemide   40 mg Oral Daily   Continuous Infusions: PRN Meds:.melatonin Allergies  Allergen Reactions   Beef-Derived Drug Products    Pork-Derived Products Other (See Comments)    Religious observance   Review of Systems  Constitutional:  Positive for unexpected weight change.       Shortness of breath, swelling resolved    Physical Exam Vitals and nursing note reviewed.  Constitutional:      General: He is not in acute distress.    Comments: Thin   Cardiovascular:     Rate and Rhythm: Normal rate.  Pulmonary:     Effort: No tachypnea, accessory muscle usage or respiratory distress.  Abdominal:     General: Abdomen is flat.   Neurological:     Mental Status: He is alert and oriented to person, place, and time.     Vital Signs: BP 101/64 (BP Location: Left Arm)   Pulse 65   Temp 98.2 F (36.8 C) (Oral)   Resp 18   Ht 6' (1.829 m)   Wt 64.2 kg   SpO2 97%   BMI 19.20 kg/m  Pain Scale:  0-10   Pain Score: 0-No pain   SpO2: SpO2: 97 % O2 Device:SpO2: 97 % O2 Flow Rate: .O2 Flow Rate (L/min): 2 L/min  IO: Intake/output summary:  Intake/Output Summary (Last 24 hours) at 07/04/2023 0854 Last data filed at 07/04/2023 0701 Gross per 24 hour  Intake 1014 ml  Output --  Net 1014 ml    LBM: Last BM Date : 07/02/23 Baseline Weight: Weight: 68 kg Most recent weight: Weight: 64.2 kg     Palliative Assessment/Data:    Time Total: 60 min  Greater than 50%  of this time was spent counseling and coordinating care related to the above assessment and plan.  Signed by: Bernarda Kitty, NP Palliative Medicine Team Pager # [phone] (M-F 8a-5p) Team Phone # [phone] (Nights/Weekends)

## 2023-07-04 NOTE — Progress Notes (Signed)
 Advanced Heart Failure Rounding Note  Cardiologist: [name], MD  HF Cardiologist: Dr. Rolan Chief Complaint: A/C HFrEF Subjective:    Still without I/Os. Weight down 3lbs from yesterday. BP stable, not high enough to wean midodrine . Cr relatively stable. Resting comfortably in bed.   Objective:    Vital Signs:   Temp:  [97.5 F (36.4 C)-98 F (36.7 C)] 98 F (36.7 C) (06/24 0448) Pulse Rate:  [64-72] 64 (06/24 0448) Resp:  [18-20] 18 (06/24 0448) BP: (94-111)/(62-78) 94/62 (06/24 0448) SpO2:  [100 %] 100 % (06/24 0448) Weight:  [64.2 kg-65.6 kg] 64.2 kg (06/24 0448) Last BM Date : 07/02/23  Weight change: Filed Weights   07/01/23 0451 07/03/23 1500 07/04/23 0448  Weight: 63.7 kg 65.6 kg 64.2 kg   Intake/Output:  Intake/Output Summary (Last 24 hours) at 07/04/2023 9278 Last data filed at 07/03/2023 2135 Gross per 24 hour  Intake 774 ml  Output --  Net 774 ml   Physical Exam   General: Elderly appearing. Cardiac: JVP flat. S1 and S2 present. No murmurs or rub. Extremities: Cool and dry.  No peripheral edema.  Neuro: Alert and oriented x3. Affect pleasant. Moves all extremities without difficulty.  Telemetry   SR in 60s (personally reviewed)  EKG    No new EKG to review  Labs    CBC Recent Labs    07/03/23 0308 07/04/23 0255  WBC 5.4 4.7  HGB 13.5 14.1  HCT 42.4 44.0  MCV 90.2 89.2  PLT 236 260   Basic Metabolic Panel Recent Labs    93/76/74 0308 07/04/23 0255  NA 135 141  K 4.3 4.3  CL 103 102  CO2 22 28  GLUCOSE 116* 131*  BUN 23 25*  CREATININE 1.22 1.32*  CALCIUM  9.0 9.3   Liver Function Tests Recent Labs    07/03/23 0308 07/04/23 0255  AST 35 27  ALT 29 28  ALKPHOS 76 68  BILITOT 0.8 0.5  PROT 6.5 6.6  ALBUMIN  3.4* 3.6   BNP (last 3 results) Recent Labs    06/13/23 0936 06/30/23 0054 07/01/23 1153  BNP 1,435.9* 1,125.8* 1,061.0*   Fasting Lipid Panel Recent Labs    07/04/23 0255  CHOL 115  HDL 46   LDLCALC 58  TRIG 55  CHOLHDL 2.5   Medications:    Scheduled Medications:  amiodarone   200 mg Oral Daily   apixaban   5 mg Oral BID   clopidogrel   75 mg Oral Daily   digoxin   0.125 mg Oral Daily   [START ON 07/05/2023] empagliflozin   10 mg Oral QAC breakfast   ezetimibe   10 mg Oral Daily   levothyroxine   75 mcg Oral QAC breakfast   linaclotide   145 mcg Oral QAC breakfast   midodrine   5 mg Oral TID WC   pantoprazole   40 mg Oral BID AC   potassium chloride  SA  40 mEq Oral Daily   rosuvastatin   40 mg Oral Daily   sodium chloride  flush  3 mL Intravenous Q12H   torsemide   40 mg Oral BID    Infusions:  PRN Medications: melatonin  Patient Profile   52 y.op. male with history of chronic systolic CHF/ICM, CAD, DM II, HTN, PAF. Presenting with acute on chronic CHF.   Assessment/Plan   1. Acute on chronic systolic CHF/ischemic cardiomyopathy: Echo 12/24 with EF 20-25%, no LV thrombus, AK mid septum to apex, moderately reduced RV systolic function. Echo 2/25 with EF 25%, mildly reduced RV function.  Low BP has limited GDMT titration.  His profound fatigue has been concerning for low output HF. RHC 3/25 showed low CI by thermo (2.05) but preserved by Fick. Borderline elevated PCWP, normal RA pressure. There was also moderate mixed pulmonary arterial/pulmonary venous hypertension. With his significant symptoms, he was started on milrinone  0.25 and added sildenafil  to try to lower PA pressure.  Milrinone  was later weaned off with medication adjustment, and co-ox remained excellent. Repeat echo 3/25 showed EF 25-30%, mild RV dysfunction, moderate MR, PASP 62 mmHg. Dr. Rolan discussed LVAD during last admission. s/p Biotronik ICD 5/25. - Presenting with worsening dyspnea and fatigue, NYHA IIIb - RHC 06/30/23 RA 5, PA 62/21 (35), Fick CO/CI 4/2.1, TD CO/CI 3.9/2.1, PVR 3 WU, PAPi 8.2. Mod- sev reduced CO, mild-mod mixed PH.  - Echo yesterday with EF 20-25% with restrictive G3DD, mod reduced RV, mod  MR. - Appears euvolemic. Needs daily weights and strict I&O. Received Torsemide  40 mg x1 yesterday. Weight down. Continue Torsemide  40 mg daily.  - Off all GDMT except jardiance  and digoxin  d/t hypotension. On midodrine .  - Unsure of route he wants to take at this time. Meds vs LVAD vs heart transplant. All are options. We will have the LVAD coordinator see him and start work up   2. CAD:  Hx inferior STEMI 10/23 s/p PCI/DES to RCA. Anterior STEMI 12/10/22, cath showed occluded pLAD treated with PCI/DES, prior RCA stent patent, severe disease OM1 and distal LCx treated medically. No chest pain.  - Continue Plavix .   - No ASA given Eliquis  use.  - Continue rosuvastatin  40 mg + Zetia  10 mg daily.   3. Atrial fibrillation: Paroxysmal. NSR on tele. - Continue Eliquis  5 mg bid. No bleeding issues. - Continue amiodarone  200 mg daily, amiodarone  labs are UTD. Levothyroxine  recently increased to 75 mg daily d/t elevated TSH. He will need a regular eye exam.    4. DM II: Per PCP. Most recent A1C 7.0 - No change.   5. 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 Not a smoker, no known lung disease. CXR has shown some chronic interstitial changes. HRCT chest with bibasilar bronchiectasis that may be due to aspiration (though swallow study negative).  V/Q scan showed no chronic PE. Rheumatologic serologies negative - Hold tadalafil  with hypotension.   6. Pleural effusion: S/p right thoracentesis 3/25. Pleural fluid was transudative by Light's criteria, no malignant cells. However, it grew E coli.  - Recent CXR (06/02/23) shows small to moderate right pleural effusion. Improved on CXR this admission.    7. GI: Has had significant weight loss, early satiety, GI discomfort.  CT abdomen/pelvis with gastric wall thickening. GI has seen and EGD was performed, no definite evidence for malignancy, biopsies taken for H pylori testing and pathology => negative for H  pylori and no gastric cancer.  - Heart failure may play a significant role in the symptoms.  - Continue PPI bid - Continue outpatient GI follow up  Length of Stay: 3  Garrett Rajesh Wyss, Garrett Roberts  07/04/2023, 7:21 AM  Advanced Heart Failure Team Pager [phone] (M-F; 7a - 5p)  Please contact [facility] for night-coverage after hours (5p -7a ) and weekends on [email or web address]

## 2023-07-04 NOTE — Progress Notes (Signed)
 PT Cancellation Note  Patient Details Name: Garrett Roberts MRN: [id number] DOB: 1955-03-25   Cancelled Treatment:    Reason Eval/Treat Not Completed: Patient at procedure or test/unavailable.  Heading imminently to pre LVAD testing.  Will check back as able. 07/04/2023  [name], PT Acute Rehabilitation Services [phone]  (office)    Vinie GAILS Darlette Dubow 07/04/2023, 12:14 PM

## 2023-07-04 NOTE — Progress Notes (Signed)
 Physical Therapy Treatment Patient Details Name: Garrett Roberts MRN: [id number] DOB: September 12, 1955 Today's Date: 07/04/2023   History of Present Illness Pt is a 68 y/o male presenting with SOB and CP over 3 day period with associated swelling in his feet and orthopnea.  PMHx:  Ant wall STEMI, inferior wall STEMI, afib, CHF, CAD, DM, HTN, HLD, cardioversions, Coronary revascularizations via CATH, ICD    PT Comments  Pt has scored a 21/24 on DGI which shows pt to be Independent and able to mobilize in his environment with a low risk of falling.    If plan is discharge home, recommend the following:  (PRN assist from family)   Can travel by private vehicle        Equipment Recommendations  None recommended by PT    Recommendations for Other Services       Precautions / Restrictions Precautions Precautions: None     Mobility  Bed Mobility Overal bed mobility: Independent                  Transfers Overall transfer level: Modified independent                      Ambulation/Gait Ambulation/Gait assistance: Supervision, Modified independent (Device/Increase time) Gait Distance (Feet): 600 Feet (with several rests to get vitals) Assistive device: None Gait Pattern/deviations: Step-through pattern   Gait velocity interpretation: >2.62 ft/sec, indicative of community ambulatory   General Gait Details: steady with age appropriate speeds attained, but not sustained for too long   Stairs Stairs: Yes Stairs assistance: Modified independent (Device/Increase time) Stair Management: One rail Right, Alternating pattern, Forwards Number of Stairs: 7 General stair comments: safe with the rails, fluid alternated gait.   Wheelchair Mobility     Tilt Bed    Modified Rankin (Stroke Patients Only)       Balance Overall balance assessment: Modified Independent   Sitting balance-Leahy Scale: Good       Standing balance-Leahy Scale: Good                    Standardized Balance Assessment Standardized Balance Assessment : Dynamic Gait Index   Dynamic Gait Index Level Surface: Normal Change in Gait Speed: Normal Gait with Horizontal Head Turns: Normal Gait with Vertical Head Turns: Normal Gait and Pivot Turn: Normal Step Over Obstacle: Mild Impairment Step Around Obstacles: Mild Impairment Steps: Mild Impairment Total Score: 21      Communication Communication Communication: No apparent difficulties  Cognition Arousal: Alert Behavior During Therapy: WFL for tasks assessed/performed   PT - Cognitive impairments: No apparent impairments                                Cueing Cueing Techniques: Verbal cues  Exercises      General Comments General comments (skin integrity, edema, etc.): Sats with gait >95% on RA,  HR mid 80's      Pertinent Vitals/Pain Pain Assessment Pain Assessment: No/denies pain    Home Living                          Prior Function            PT Goals (current goals can now be found in the care plan section) Acute Rehab PT Goals PT Goal Formulation: With patient Time For Goal Achievement: 07/17/23 Potential to Achieve Goals: Good Progress towards PT  goals: Progressing toward goals    Frequency    Min 3X/week      PT Plan      Co-evaluation              AM-PAC PT 6 Clicks Mobility   Outcome Measure  Help needed turning from your back to your side while in a flat bed without using bedrails?: None Help needed moving from lying on your back to sitting on the side of a flat bed without using bedrails?: None Help needed moving to and from a bed to a chair (including a wheelchair)?: None Help needed standing up from a chair using your arms (e.g., wheelchair or bedside chair)?: None Help needed to walk in hospital room?: None Help needed climbing 3-5 steps with a railing? : A Little 6 Click Score: 23    End of Session   Activity Tolerance:  Patient tolerated treatment well Patient left: with call bell/phone within reach;with family/visitor present Nurse Communication: Mobility status PT Visit Diagnosis: Other abnormalities of gait and mobility (R26.89);Difficulty in walking, not elsewhere classified (R26.2)     Time: 8248-8195 PT Time Calculation (min) (ACUTE ONLY): 13 min  Charges:    $Gait Training: 8-22 mins PT General Charges $$ ACUTE PT VISIT: 1 Visit                     07/04/2023  Garrett HERO., PT Acute Rehabilitation Services [phone]  (office)   Garrett Roberts 07/04/2023, 6:16 PM

## 2023-07-04 NOTE — Progress Notes (Signed)
 Progress note   Patient: Garrett Roberts MRN: [id number] DOB: 1955/08/19  Admit date:     06/30/2023  Discharge date: 07/04/23  Discharge Physician: Adriana DELENA Grams   PCP: Vincente Shivers, NP   Patient was deemed for discharge, cardiology would like to continue recommendation evaluation for LCAD.  At this point patient refusing to have the procedure and device. He will be ready for discharge tomorrow 07/04/2023 afternoon.  ------------------------------------------------------------------------------------------------------------------------ Recommendations at discharge:    Follow-up with your cardiologist within 1 week Follow-up with the PCP over the 1-2 weeks Please make a note of change of all your medication including heart medicines Continue monitor daily weight, blood pressure.  Discharge Diagnoses: Principal Problem:   HFrEF (heart failure with reduced ejection fraction) (HCC) Active Problems:   Recurrent right pleural effusion   Chronic hypotension   CAD (coronary artery disease)   Atrial fibrillation (HCC)   Renal insufficiency   Type 2 diabetes mellitus with complication, without long-term current use of insulin  (HCC)   Hypothyroidism   GERD (gastroesophageal reflux disease)  Resolved Problems:   * No resolved hospital problems. *  Hospital Course: Garrett Roberts is a 68 y.o. with history of CAD, PAF, DM II, HTN, HLD, ischemic cardiomyopathy/chronic systolic CHF.    Patient had inferior STEMI 10/23 s/p PCI/DES to RCA. Had residual diffuse disease in diagonals and OMs. EF 40-45% at time of cath. Subsequent echo in 10/23 with EF 55-60%.  Echo 3/25 showed EF 25-30%   Presents with complaints of shortness of breath and chest pain, feet swelling, and when laying flat for the past 3 days.  ED:  He was afebrile with mild tachypnea with ambulation,  BUN 28, creatinine 148. BNP 1125.8, high-sensitivity troponin 18-> 19,  Chest x-ray noted mild right perihilar  edema, small right pleural effusion, and associated right lower lobe opacity thought to be atelectasis.  Patient had been given Lasix  40 mg IV x 2 doses and 80 meq of potassium chloride .   Cardiology had been consulted and plan to take the patient for right heart cath.     Sever HFrEF - end-stage Pleural effusion - Hemodynamically stable, continue midodrine   - Acute on chronic.  Patient presented with acute worsening shortness of breath and orthopne-volume overload -  BNP was elevated at 1125.8. >>  1061.0  - Chest x-ray mild right perihilar edema, small Rt pleural effusion, Rt LL lobe opacity thought to be atelectasis.   Last echo; EF to be 25 to 30% with indeterminate diastolic parameters when checked 03/30/2022.   Patient status post Biotronik ICD in 05/2023.     Patient had been given Lasix  40 mg IV x 2 -switch back to oral torsemide   - Strict I&Os and daily Roberts Continue with diuresis - 06/30/2023 s/p right cardiac cath: Moderate-severely reduced CO, volume overload, mild-moderate mixed pulmonary hypertension  -Biotronik ICD 05/2023 -Jardiance  10 mg a day - Furosemide  40 mg IV daily-switch to p.o. torsemide  40 mg p.o. daily -Midodrine  5 mg twice a day to support blood pressure -Albumin  12.5 g IV X 2       Patient is currently under evaluation for LVAD with cardiology  Has not cleared the patient for discharge yet    chronic hypotension -Hypotensive likely chronic -12.5 g of IV albumin - X 1 more dose today -Continue midodrine  at 5 mg p.o.   CAD -with history of stenting, on Plavix , Eliquis , Crestor , Zetia -no ASA    Paroxysmal atrial fibrillation  - In normal sinus rhythm,  - Will  continue Digoxin , Amiodarone , Eliquis     Renal insufficiency  Baseline creatinine appears to range from 1.3-1.6 - Continue to monitor kidney function. -Creatinine of 1.28>> 1.26   Diabetes mellitus type 2, without long-term use of insulin  06/12/2023 last A1c 7.0,   - Continue  Jardiance , SSI    Hypothyroidism TSH noted to be 22.402 on 6/30 for which patient levothyroxine  was increased. - Commend outpatient follow-up of thyroid  studies in 2 to 4 weeks - Continue levothyroxine    GERD - Continue Protonix    Consultants: Cardiology/heart failure team  Disposition: Home in am (after evaluation for L VAD)    Diet recommendation:  Discharge Diet Orders (From admission, onward)     Start     Ordered   07/03/23 0000  Diet - low sodium heart healthy        07/03/23 0957           Cardiac diet DISCHARGE MEDICATION:    Follow-up Information     Vincente Shivers, NP Follow up on 07/06/2023.   Specialty: General Practice Why: hospital follow up appointment @11 :40am Contact information: [address] [place] [address] [phone]                Discharge Exam: Garrett Roberts   07/01/23 0451 07/03/23 1500 07/04/23 0448  Weight: 63.7 kg 65.6 kg 64.2 kg     General:  AAO x 3,  cooperative, no distress;   HEENT:  Normocephalic, PERRL, otherwise with in Normal limits   Neuro:  CNII-XII intact. , normal motor and sensation, reflexes intact   Lungs:   Clear to auscultation BL, Respirations unlabored,  No wheezes / crackles  Cardio:    S1/S2, RRR, No murmure, No Rubs or Gallops   Abdomen:  Soft, non-tender, bowel sounds active all four quadrants, no guarding or peritoneal signs.  Muscular  skeletal:  Limited exam -global generalized weaknesses - in bed, able to move all 4 extremities,   2+ pulses,  symmetric, No pitting edema  Skin:  Dry, warm to touch, negative for any Rashes,  Wounds: Please see nursing documentation       Condition at discharge: good  The results of significant diagnostics from this hospitalization (including imaging, microbiology, ancillary and laboratory) are listed below for reference.   Imaging Studies: DG Orthopantogram Result Date: 07/03/2023 CLINICAL DATA:  [id number] Preoperative evaluation to rule out  surgical contraindication 414666 EXAM: ORTHOPANTOGRAM/PANORAMIC COMPARISON:  None Available. FINDINGS: Numerous missing mandibular and maxillary teeth. No visible periapical abscess. No acute bony abnormality. Visualized paranasal sinuses clear. IMPRESSION: No acute findings. Electronically Signed   By: Franky Crease M.D.   On: 07/03/2023 17:00   ECHOCARDIOGRAM COMPLETE Result Date: 07/03/2023    ECHOCARDIOGRAM REPORT   Patient Name:   Garrett Roberts Date of Exam: 07/03/2023 Medical Rec #:  [id number]            Height:       72.0 in Accession #:    [id number]           Weight:       140.4 lb Date of Birth:  Oct 30, 1955           BSA:          1.833 m Patient Age:    67 years             BP:           105/73 mmHg Patient Gender: M  HR:           66 bpm. Exam Location:  Inpatient Procedure: 2D Echo (Both Spectral and Color Flow Doppler were utilized during            procedure). Indications:    heart failure. LVAD evaluation.  History:        Patient has prior history of Echocardiogram examinations, most                 recent 03/30/2023. CAD, Defibrillator, chronic kidney disease,                 Arrythmias:Atrial Fibrillation, Signs/Symptoms:Edema and Chest                 Pain; Risk Factors:Hypertension, Diabetes and Dyslipidemia.  Sonographer:    Tinnie Barefoot RDCS Referring Phys: [phone] ALMA L DIAZ IMPRESSIONS  1. Left ventricular ejection fraction, by estimation, is 20 to 25%. The left ventricle has severely decreased function. The left ventricle demonstrates global hypokinesis. The left ventricular internal cavity size was mildly dilated. Left ventricular diastolic parameters are consistent with Grade III diastolic dysfunction (restrictive).  2. Right ventricular systolic function is moderately reduced. The right ventricular size is normal. There is moderately elevated pulmonary artery systolic pressure. The estimated right ventricular systolic pressure is 44.0 mmHg.  3. Left atrial  size was severely dilated.  4. The mitral valve is degenerative. Mild to moderate mitral valve regurgitation. No evidence of mitral stenosis.  5. Tricuspid valve regurgitation is mild to moderate.  6. The aortic valve is tricuspid. Aortic valve regurgitation is not visualized. No aortic stenosis is present.  7. The inferior vena cava is normal in size with <50% respiratory variability, suggesting right atrial pressure of 8 mmHg. FINDINGS  Left Ventricle: Left ventricular ejection fraction, by estimation, is 20 to 25%. The left ventricle has severely decreased function. The left ventricle demonstrates global hypokinesis. The left ventricular internal cavity size was mildly dilated. There is no left ventricular hypertrophy. Left ventricular diastolic parameters are consistent with Grade III diastolic dysfunction (restrictive). Right Ventricle: The right ventricular size is normal. No increase in right ventricular wall thickness. Right ventricular systolic function is moderately reduced. There is moderately elevated pulmonary artery systolic pressure. The tricuspid regurgitant velocity is 3.00 m/s, and with an assumed right atrial pressure of 8 mmHg, the estimated right ventricular systolic pressure is 44.0 mmHg. Left Atrium: Left atrial size was severely dilated. Right Atrium: Right atrial size was normal in size. Pericardium: There is no evidence of pericardial effusion. Mitral Valve: The mitral valve is degenerative in appearance. Moderately decreased mobility of the mitral valve leaflets. Mild to moderate mitral valve regurgitation. No evidence of mitral valve stenosis. Tricuspid Valve: The tricuspid valve is normal in structure. Tricuspid valve regurgitation is mild to moderate. No evidence of tricuspid stenosis. Aortic Valve: The aortic valve is tricuspid. Aortic valve regurgitation is not visualized. No aortic stenosis is present. Pulmonic Valve: The pulmonic valve was normal in structure. Pulmonic valve  regurgitation is mild. No evidence of pulmonic stenosis. Aorta: The aortic root is normal in size and structure. Venous: The inferior vena cava is normal in size with less than 50% respiratory variability, suggesting right atrial pressure of 8 mmHg. IAS/Shunts: No atrial level shunt detected by color flow Doppler. Additional Comments: A device lead is visualized.  LEFT VENTRICLE PLAX 2D LVIDd:         5.60 cm      Diastology LVIDs:  4.70 cm      LV e' medial:  4.46 cm/s LV PW:         0.90 cm      LV e' lateral: 10.00 cm/s LV IVS:        1.00 cm LVOT diam:     1.80 cm LV SV:         34 LV SV Index:   18 LVOT Area:     2.54 cm  LV Volumes (MOD) LV vol d, MOD A4C: 149.0 ml LV vol s, MOD A4C: 111.0 ml LV SV MOD A4C:     149.0 ml RIGHT VENTRICLE            IVC RV Basal diam:  3.30 cm    IVC diam: 1.90 cm RV S prime:     9.25 cm/s TAPSE (M-mode): 1.3 cm LEFT ATRIUM              Index        RIGHT ATRIUM           Index LA diam:        4.70 cm  2.56 cm/m   RA Area:     17.90 cm LA Vol (A2C):   112.0 ml 61.09 ml/m  RA Volume:   53.80 ml  29.35 ml/m LA Vol (A4C):   89.7 ml  48.93 ml/m LA Biplane Vol: 100.0 ml 54.55 ml/m  AORTIC VALVE LVOT Vmax:   75.50 cm/s LVOT Vmean:  48.200 cm/s LVOT VTI:    0.132 m  AORTA Ao Root diam: 3.50 cm Ao Asc diam:  3.90 cm MITRAL VALVE              TRICUSPID VALVE MV Area VTI: 0.30 cm     TV Peak grad:   36.2 mmHg MV VTI:      1.13 m       TV Vmax:        3.01 m/s MR Peak grad: 58.1 mmHg   TR Peak grad:   36.0 mmHg MR Vmax:      381.00 cm/s TR Vmax:        300.00 cm/s                            SHUNTS                           Systemic VTI:  0.13 m                           Systemic Diam: 1.80 cm Morene Brownie Electronically signed by Morene Brownie Signature Date/Time: 07/03/2023/4:54:56 PM    Final    CARDIAC CATHETERIZATION Result Date: 07/02/2023 Findings: RA = 5 RV = 65/10 PA = 62/21 (35) PCW = 23 Fick cardiac output/index = 4.0/2.1 Thermo CO/CI = 3.9/2.1 PVR = 3.0 WU  Ao sat = 99% PA sat = 68% PAPi = 8.2 Assessment: 1. Moderate to severely reduced CO 2. Volume overload 3. 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 Plan/Discussion: Continue diuresis. Consider w/u for advanced therapies as outpatient Daniel Bensimhon, MD 3:37 PM  DG Chest 2 View Result Date: 06/30/2023 EXAM: 2 VIEW(S) XRAY OF THE CHEST 06/30/2023 01:04:00 AM COMPARISON: 06/02/2023 CLINICAL HISTORY: shob. Encounter for shortness of breath FINDINGS: LUNGS AND PLEURA: Small right pleural effusion. Associated right lower lobe opacity, likely atelectasis. Mild right perihilar edema. HEART  AND MEDIASTINUM: No acute abnormality of the cardiac and mediastinal silhouettes. BONES AND SOFT TISSUES: No acute osseous abnormality. Left subclavian ICD in appropriate position. IMPRESSION: 1. Mild right perihilar edema. 2. Small right pleural effusion. Associated right lower lobe opacity, likely atelectasis. Electronically signed by: Pinkie Pebbles MD 06/30/2023 01:07 AM EDT RP Workstation: [id number]   DG Abd 1 View Result Date: 06/16/2023 CLINICAL DATA:  Evaluate stool burden EXAM: ABDOMEN - 1 VIEW COMPARISON:  None Available. FINDINGS: Moderate amount of residual fecal material throughout the colon without obstruction IMPRESSION: Moderate amount of residual fecal material throughout the colon without obstruction. Electronically Signed   By: Franky Chard M.D.   On: 06/16/2023 11:35    Microbiology: Results for orders placed or performed during the hospital encounter of 03/20/23  MRSA Next Gen by PCR, Nasal     Status: None   Collection Time: 03/20/23  4:54 PM   Specimen: Nasal Mucosa; Nasal Swab  Result Value Ref Range Status   MRSA by PCR Next Gen NOT DETECTED NOT DETECTED Final    Comment: (NOTE) The GeneXpert MRSA Assay (FDA approved for NASAL specimens only), is one component of a comprehensive MRSA colonization surveillance program. It is not intended to diagnose MRSA infection nor to guide or monitor treatment for  MRSA infections. Test performance is not FDA approved in patients less than 88 years old. Performed at [facility], [address], [place], [place] [address]   Body fluid culture w Gram Stain     Status: None   Collection Time: 03/21/23  4:01 PM   Specimen: Pleural Fluid  Result Value Ref Range Status   Specimen Description PLEURAL  Final   Special Requests RIGHT LUNG  Final   Gram Stain   Final    NO WBC SEEN NO ORGANISMS SEEN Performed at [facility], [address] [place], [place] [address]    Culture   Final    RARE ESCHERICHIA COLI CRITICAL RESULT CALLED TO, READ BACK BY AND VERIFIED WITH: RN JUSTIN.G AT 1248 ON 03/23/2023 BY T.SAAD. Confirmed Extended Spectrum Beta-Lactamase Producer (ESBL).  In bloodstream infections from ESBL organisms, carbapenems are preferred over piperacillin/tazobactam. They are shown to have a lower risk of mortality.    Report Status 03/25/2023 FINAL  Final   Organism ID, Bacteria ESCHERICHIA COLI  Final      Susceptibility   Escherichia coli - MIC*    AMPICILLIN >=32 RESISTANT Resistant     CEFEPIME 16 RESISTANT Resistant     CEFTAZIDIME RESISTANT Resistant     CEFTRIAXONE  >=64 RESISTANT Resistant     CIPROFLOXACIN  >=4 RESISTANT Resistant     GENTAMICIN  >=16 RESISTANT Resistant     IMIPENEM <=0.25 SENSITIVE Sensitive     TRIMETH /SULFA  <=20 SENSITIVE Sensitive     AMPICILLIN/SULBACTAM >=32 RESISTANT Resistant     PIP/TAZO <=4 SENSITIVE Sensitive ug/mL    * RARE ESCHERICHIA COLI    Labs: CBC: Recent Labs  Lab 06/30/23 0054 06/30/23 1419 06/30/23 1420 06/30/23 1424 07/02/23 0255 07/03/23 0308 07/04/23 0255  WBC 6.0  --   --   --  5.6 5.4 4.7  HGB 14.9   < > 15.6 15.6 14.0 13.5 14.1  HCT 47.3   < > 46.0 46.0 44.4 42.4 44.0  MCV 91.3  --   --   --  90.4 90.2 89.2  PLT 274  --   --   --  240 236 260   < > = values in  this interval not displayed.   Basic Metabolic Panel: Recent Labs  Lab 06/30/23 0054 06/30/23 0619  06/30/23 1419 06/30/23 1424 07/01/23 0316 07/02/23 0255 07/03/23 0308 07/04/23 0255  NA 135  --    < > 138 139 137 135 141  K 3.6  --    < > 4.1 3.7 4.4 4.3 4.3  CL 97*  --   --   --  102 101 103 102  CO2 26  --   --   --  27 25 22 28   GLUCOSE 149*  --   --   --  98 178* 116* 131*  BUN 28*  --   --   --  24* 25* 23 25*  CREATININE 1.48*  --   --   --  1.28* 1.26* 1.22 1.32*  CALCIUM  9.3  --   --   --  9.3 9.3 9.0 9.3  MG  --  2.5*  --   --  2.4  --   --   --    < > = values in this interval not displayed.   Liver Function Tests: Recent Labs  Lab 07/02/23 0255 07/03/23 0308 07/04/23 0255  AST 28 35 27  ALT 25 29 28   ALKPHOS 71 76 68  BILITOT 0.4 0.8 0.5  PROT 6.6 6.5 6.6  ALBUMIN  3.4* 3.4* 3.6   CBG: Recent Labs  Lab 06/30/23 1459 07/02/23 2144  GLUCAP 137* 207*    time spent: greater than 35 minutes.  Signed: Adriana DELENA Grams, MD [facility] 07/04/2023

## 2023-07-04 NOTE — Consult Note (Signed)
 [address], [place] [address]             [phone]    Cardiothoracic Surgery Consultation  Reason for Consult: Ischemic cardiomyopathy with acute on chronic systolic heart failure and LVEF 20-25%. Referring Physician: Dr. Stoner  Garrett Roberts is an 68 y.o. male.  HPI:   The patient is a 68 year old gentleman from the [place] with a history of diabetes, hyperlipidemia, hypertension, coronary artery disease status post anterior and inferior STEMI, atrial fibrillation, and acute on chronic systolic congestive heart failure due to ischemic cardiomyopathy who is referred for consideration of LVAD therapy.  He has had recurrent admissions for acute congestive heart failure since February 2025.  Right heart catheterization on 03/20/2023 showed low cardiac index of 2.05 by thermodilution as well as moderate pulmonary hypertension with low right atrial pressure.  He was admitted and started on milrinone  which was titrated off with acceptable Co-ox.  He had a moderate right pleural effusion and underwent thoracentesis.  This fluid was transudative but grew E. coli and he was treated with Bactrim .  He has had 30+ pound weight loss since December 2024 which he relates to early satiety and poor appetite.  He had EGD that showed gastritis and biopsies were negative for H. pylori.  There were no signs of gastric cancer.  He says that his weight has now stabilized.  He had a high-resolution chest CT that showed bibasilar bronchiectasis with a question of aspiration but swallowing study was negative.  VQ scan was negative for chronic PE.  He was started on sildenafil  and his medications were titrated.  He underwent insertion of an ICD in May 2025.  He is now admitted with at least a 1 month history of exertional fatigue and shortness of breath as well as fluid retention with lower extremity edema and abdominal bloating.  He reports some orthopnea.  He took extra oral diuretic at  home without improvement and therefore came to the emergency room.  Right heart catheterization on 06/30/2023 showed:  RA = 5 RV = 65/10 PA = 62/21 (35) PCW = 23 Fick cardiac output/index = 4.0/2.1 Thermo CO/CI = 3.9/2.1 PVR = 3.0 WU Ao sat = 99% PA sat = 68% PAPi = 8.2   Assessment: 1. Moderate to severely reduced CO 2. Volume overload 3. 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     He had a repeat echocardiogram yesterday showing a left ventricular ejection fraction of 20 to 25% with mild LV cavity dilation.  There is grade 3 diastolic dysfunction.  Right ventricular systolic function was moderately reduced with moderate elevation of PA systolic pressure.  There was no aortic insufficiency.  There is mild to moderate tricuspid valve regurgitation.  There is mild to moderate mitral valve regurgitation.  He has been diuresed and feels much better.  GDMT has been limited due to hypotension requiring midodrine .   Past Medical History:  Diagnosis Date   Acute ST elevation myocardial infarction (STEMI) of anterior wall (HCC) 12/10/2022   Acute ST elevation myocardial infarction (STEMI) of inferior wall (HCC) 10/30/2021   Atrial fibrillation (HCC)    Atrial fibrillation with RVR (HCC) 12/19/2022   CHF (congestive heart failure) (HCC)    Coronary artery disease    Diabetes mellitus without complication (HCC)    DM (diabetes mellitus) (HCC)    H. pylori infection    Heart attack (HCC)    HLD (hyperlipidemia)    Hypertension  ST elevation myocardial infarction involving left anterior descending (LAD) coronary artery (HCC) 12/10/2022    Past Surgical History:  Procedure Laterality Date   CARDIOVERSION N/A 12/14/2022   Procedure: CARDIOVERSION;  Surgeon: Kate Lonni CROME, MD;  Location: [facility] INVASIVE CV LAB;  Service: Cardiovascular;  Laterality: N/A;   CORONARY/GRAFT ACUTE MI REVASCULARIZATION N/A 10/30/2021   Procedure: Coronary/Graft Acute MI Revascularization;  Surgeon: Ladona Heinz, MD;   Location: [facility] INVASIVE CV LAB;  Service: Cardiovascular;  Laterality: N/A;   CORONARY/GRAFT ACUTE MI REVASCULARIZATION N/A 12/10/2022   Procedure: Coronary/Graft Acute MI Revascularization;  Surgeon: Wonda Sharper, MD;  Location: [facility] INVASIVE CV LAB;  Service: Cardiovascular;  Laterality: N/A;   ESOPHAGOGASTRODUODENOSCOPY N/A 03/23/2023   Procedure: EGD (ESOPHAGOGASTRODUODENOSCOPY);  Surgeon: Stacia Glendia BRAVO, MD;  Location: [facility] ENDOSCOPY;  Service: Gastroenterology;  Laterality: N/A;   ICD IMPLANT N/A 06/01/2023   Procedure: ICD IMPLANT;  Surgeon: Kennyth Chew, MD;  Location: [facility] INVASIVE CV LAB;  Service: Cardiovascular;  Laterality: N/A;   LEFT HEART CATH AND CORONARY ANGIOGRAPHY N/A 10/30/2021   Procedure: LEFT HEART CATH AND CORONARY ANGIOGRAPHY;  Surgeon: Ladona Heinz, MD;  Location: [facility] INVASIVE CV LAB;  Service: Cardiovascular;  Laterality: N/A;   LEFT HEART CATH AND CORONARY ANGIOGRAPHY N/A 12/10/2022   Procedure: LEFT HEART CATH AND CORONARY ANGIOGRAPHY;  Surgeon: Wonda Sharper, MD;  Location: [facility] INVASIVE CV LAB;  Service: Cardiovascular;  Laterality: N/A;   RIGHT HEART CATH N/A 03/20/2023   Procedure: RIGHT HEART CATH;  Surgeon: Rolan Ezra RAMAN, MD;  Location: [facility] INVASIVE CV LAB;  Service: Cardiovascular;  Laterality: N/A;   RIGHT HEART CATH N/A 06/30/2023   Procedure: RIGHT HEART CATH;  Surgeon: Cherrie Toribio SAUNDERS, MD;  Location: [facility] INVASIVE CV LAB;  Service: Cardiovascular;  Laterality: N/A;    Family History  Problem Relation Age of Onset   Diabetes Mother    Chronic Renal Failure Father    Diabetes Father    Chronic Renal Failure Brother    Diabetes Brother    Diabetes Brother    Heart attack Brother    Stroke Brother     Social History:  reports that he has never smoked. He has never used smokeless tobacco. He reports that he does not drink alcohol and does not use drugs.  He lives at home in [place] with his wife and children.  Allergies:  Allergies  Allergen  Reactions   Beef-Derived Drug Products    Pork-Derived Products Other (See Comments)    Religious observance    Medications: I have reviewed the patient's current medications. Prior to Admission:  Medications Prior to Admission  Medication Sig Dispense Refill Last Dose/Taking   acetaminophen  (TYLENOL ) 325 MG tablet Take 1-2 tablets (325-650 mg total) by mouth every 4 (four) hours as needed for mild pain (pain score 1-3). (Patient taking differently: Take 325 mg by mouth daily as needed for mild pain (pain score 1-3) or moderate pain (pain score 4-6).)   Past Week   apixaban  (ELIQUIS ) 5 MG TABS tablet Take 1 tablet (5 mg total) by mouth 2 (two) times daily.   06/29/2023 at 10:00 PM   Ascorbic Acid (VITAMIN C PO) Take 1 tablet by mouth daily.   06/29/2023   clopidogrel  (PLAVIX ) 75 MG tablet Take 1 tablet (75 mg total) by mouth daily. 30 tablet 2 06/29/2023   empagliflozin  (JARDIANCE ) 10 MG TABS tablet Take 1 tablet (10 mg total) by mouth daily before breakfast.   06/29/2023   levothyroxine  (SYNTHROID ) 75 MCG tablet Take  1 tablet (75 mcg total) by mouth daily. 90 tablet 0 06/29/2023   linaclotide  (LINZESS ) 145 MCG CAPS capsule Take 1 capsule (145 mcg total) by mouth daily before breakfast.   06/29/2023   MAGNESIUM PO Take 1 tablet by mouth at bedtime.   06/29/2023   melatonin 5 MG TABS Take 5 mg by mouth at bedtime as needed (sleep).   Unknown   midodrine  (PROAMATINE ) 2.5 MG tablet Take 1 tablet (2.5 mg total) by mouth 3 (three) times daily with meals. (Patient taking differently: Take 2.5 mg by mouth in the morning and at bedtime.) 90 tablet 3 06/29/2023   mirtazapine  (REMERON ) 15 MG tablet Take 1 tablet (15 mg total) by mouth at bedtime. 30 tablet 2 06/29/2023   ondansetron  (ZOFRAN -ODT) 4 MG disintegrating tablet Take 1 tablet (4 mg total) by mouth every 8 (eight) hours as needed. 20 tablet 0 Unknown   potassium chloride  SA (KLOR-CON  M) 20 MEQ tablet Take 2 tablets (40 mEq total) by mouth daily. 90  tablet 3 06/29/2023   rosuvastatin  (CRESTOR ) 40 MG tablet Take 40 mg by mouth daily.   06/29/2023   tadalafil  (CIALIS ) 20 MG tablet Take 1 tablet (20 mg total) by mouth daily. 90 tablet 3 06/29/2023   torsemide  (DEMADEX ) 20 MG tablet Take 2 tablets (40 mg total) by mouth 2 (two) times daily. 180 tablet 3 06/29/2023   Vitamin D-Vitamin K (VITAMIN K2-VITAMIN D3 PO) Take 1 tablet by mouth daily with lunch.   06/29/2023   zinc gluconate 50 MG tablet Take 50 mg by mouth daily with lunch.   06/29/2023   [DISCONTINUED] amiodarone  (PACERONE ) 200 MG tablet Take 1 tablet (200 mg total) by mouth daily. 30 tablet 5 06/29/2023   [DISCONTINUED] digoxin  (LANOXIN ) 0.125 MG tablet Take 1 tablet (0.125 mg total) by mouth daily. 90 tablet 3 06/29/2023   [DISCONTINUED] ezetimibe  (ZETIA ) 10 MG tablet Take 1 tablet (10 mg total) by mouth daily. 90 tablet 3 06/29/2023   pantoprazole  (PROTONIX ) 40 MG tablet Take 1 tablet (40 mg total) by mouth 2 (two) times daily before a meal. (Patient not taking: Reported on 06/30/2023) 180 tablet 3 Not Taking   spironolactone  (ALDACTONE ) 25 MG tablet Take 25 mg by mouth daily. (Patient not taking: Reported on 06/30/2023)   Not Taking   Scheduled:  amiodarone   200 mg Oral Daily   apixaban   5 mg Oral BID   clopidogrel   75 mg Oral Daily   digoxin   0.125 mg Oral Daily   [START ON 07/05/2023] empagliflozin   10 mg Oral QAC breakfast   ezetimibe   10 mg Oral Daily   feeding supplement (GLUCERNA SHAKE)  237 mL Oral TID BM   levothyroxine   75 mcg Oral QAC breakfast   linaclotide   145 mcg Oral QAC breakfast   midodrine   5 mg Oral TID WC   pantoprazole   40 mg Oral BID AC   potassium chloride  SA  40 mEq Oral Daily   rosuvastatin   40 mg Oral Daily   sodium chloride  flush  3 mL Intravenous Q12H   torsemide   40 mg Oral Daily   Continuous: EMW:[id number] Anti-infectives (From admission, onward)    None       Results for orders placed or performed during the hospital encounter of 06/30/23 (from  the past 48 hours)  Glucose, capillary     Status: Abnormal   Collection Time: 07/02/23  9:44 PM  Result Value Ref Range   Glucose-Capillary 207 (H) 70 - 99 mg/dL  Comment: Glucose reference range applies only to samples taken after fasting for at least 8 hours.  CBC     Status: None   Collection Time: 07/03/23  3:08 AM  Result Value Ref Range   WBC 5.4 4.0 - 10.5 K/uL   RBC 4.70 4.22 - 5.81 MIL/uL   Hemoglobin 13.5 13.0 - 17.0 g/dL   HCT 57.5 60.9 - 47.9 %   MCV 90.2 80.0 - 100.0 fL   MCH 28.7 26.0 - 34.0 pg   MCHC 31.8 30.0 - 36.0 g/dL   RDW 85.4 88.4 - 84.4 %   Platelets 236 150 - 400 K/uL   nRBC 0.0 0.0 - 0.2 %    Comment: Performed at [facility] Lab, [address], [place], [place] [address]  Comprehensive metabolic panel with GFR     Status: Abnormal   Collection Time: 07/03/23  3:08 AM  Result Value Ref Range   Sodium 135 135 - 145 mmol/L   Potassium 4.3 3.5 - 5.1 mmol/L   Chloride 103 98 - 111 mmol/L   CO2 22 22 - 32 mmol/L   Glucose, Bld 116 (H) 70 - 99 mg/dL    Comment: Glucose reference range applies only to samples taken after fasting for at least 8 hours.   BUN 23 8 - 23 mg/dL   Creatinine, Ser 8.77 0.61 - 1.24 mg/dL   Calcium  9.0 8.9 - 10.3 mg/dL   Total Protein 6.5 6.5 - 8.1 g/dL   Albumin  3.4 (L) 3.5 - 5.0 g/dL   AST 35 15 - 41 U/L   ALT 29 0 - 44 U/L   Alkaline Phosphatase 76 38 - 126 U/L   Total Bilirubin 0.8 0.0 - 1.2 mg/dL   GFR, Estimated >39 >39 mL/min    Comment: (NOTE) Calculated using the CKD-EPI Creatinine Equation (2021)    Anion gap 10 5 - 15    Comment: Performed at [facility] Lab, [address] [place], [place] [address]  ABO/Rh     Status: None   Collection Time: 07/03/23  3:08 AM  Result Value Ref Range   ABO/RH(D)      A POS Performed at [facility], [address], [place], [place] [address]   Rapid urine drug screen (hospital performed)     Status: None   Collection Time: 07/03/23  4:04 PM  Result Value Ref  Range   Opiates NONE DETECTED NONE DETECTED   Cocaine NONE DETECTED NONE DETECTED   Benzodiazepines NONE DETECTED NONE DETECTED   Amphetamines NONE DETECTED NONE DETECTED   Tetrahydrocannabinol NONE DETECTED NONE DETECTED   Barbiturates NONE DETECTED NONE DETECTED    Comment: (NOTE) DRUG SCREEN FOR MEDICAL PURPOSES ONLY.  IF CONFIRMATION IS NEEDED FOR ANY PURPOSE, NOTIFY LAB WITHIN 5 DAYS.  LOWEST DETECTABLE LIMITS FOR URINE DRUG SCREEN Drug Class                     Cutoff (ng/mL) Amphetamine and metabolites    1000 Barbiturate and metabolites    200 Benzodiazepine                 200 Opiates and metabolites        300 Cocaine and metabolites        300 THC                            50 Performed at [facility]  Lab, [address], [place], [place] [address]   Urinalysis, Routine w reflex microscopic -Urine, Clean Catch     Status: Abnormal   Collection Time: 07/03/23  4:09 PM  Result Value Ref Range   Color, Urine STRAW (A) YELLOW   APPearance CLEAR CLEAR   Specific Gravity, Urine 1.007 1.005 - 1.030   pH 5.0 5.0 - 8.0   Glucose, UA >=500 (A) NEGATIVE mg/dL   Hgb urine dipstick NEGATIVE NEGATIVE   Bilirubin Urine NEGATIVE NEGATIVE   Ketones, ur NEGATIVE NEGATIVE mg/dL   Protein, ur NEGATIVE NEGATIVE mg/dL   Nitrite NEGATIVE NEGATIVE   Leukocytes,Ua NEGATIVE NEGATIVE   RBC / HPF 0-5 0 - 5 RBC/hpf   WBC, UA 0-5 0 - 5 WBC/hpf   Bacteria, UA NONE SEEN NONE SEEN   Squamous Epithelial / HPF 0-5 0 - 5 /HPF   Mucus PRESENT     Comment: Performed at [facility] Lab, [address] [place], [place] [address]  Type and screen     Status: None   Collection Time: 07/04/23  2:41 AM  Result Value Ref Range   ABO/RH(D) A POS    Antibody Screen NEG    Sample Expiration      07/07/2023,2359 Performed at [facility] Lab, [address], [place], [place] [address]   CBC     Status: None   Collection Time: 07/04/23  2:55 AM  Result Value Ref Range   WBC 4.7 4.0 - 10.5  K/uL   RBC 4.93 4.22 - 5.81 MIL/uL   Hemoglobin 14.1 13.0 - 17.0 g/dL   HCT 55.9 60.9 - 47.9 %   MCV 89.2 80.0 - 100.0 fL   MCH 28.6 26.0 - 34.0 pg   MCHC 32.0 30.0 - 36.0 g/dL   RDW 85.3 88.4 - 84.4 %   Platelets 260 150 - 400 K/uL   nRBC 0.0 0.0 - 0.2 %    Comment: Performed at [facility] Lab, [address] [place], [place] [address]  Comprehensive metabolic panel with GFR     Status: Abnormal   Collection Time: 07/04/23  2:55 AM  Result Value Ref Range   Sodium 141 135 - 145 mmol/L   Potassium 4.3 3.5 - 5.1 mmol/L   Chloride 102 98 - 111 mmol/L   CO2 28 22 - 32 mmol/L   Glucose, Bld 131 (H) 70 - 99 mg/dL    Comment: Glucose reference range applies only to samples taken after fasting for at least 8 hours.   BUN 25 (H) 8 - 23 mg/dL   Creatinine, Ser 8.67 (H) 0.61 - 1.24 mg/dL   Calcium  9.3 8.9 - 10.3 mg/dL   Total Protein 6.6 6.5 - 8.1 g/dL   Albumin  3.6 3.5 - 5.0 g/dL   AST 27 15 - 41 U/L   ALT 28 0 - 44 U/L   Alkaline Phosphatase 68 38 - 126 U/L   Total Bilirubin 0.5 0.0 - 1.2 mg/dL   GFR, Estimated 59 (L) >60 mL/min    Comment: (NOTE) Calculated using the CKD-EPI Creatinine Equation (2021)    Anion gap 11 5 - 15    Comment: Performed at [facility] Lab, [address], [place], [place] [address]  Antithrombin III     Status: None   Collection Time: 07/04/23  2:55 AM  Result Value Ref Range   AntiThromb III Func 85 75 - 120 %    Comment: Performed at [facility] Lab,  [address], [place], [place] [address]  APTT     Status: None   Collection Time: 07/04/23  2:55 AM  Result Value Ref Range   aPTT 34 24 - 36 seconds    Comment: Performed at [facility] Lab, [address] [place], [place] [address]  Hepatitis B surface antibody,qualitative     Status: None   Collection Time: 07/04/23  2:55 AM  Result Value Ref Range   Hep B S Ab NON REACTIVE NON REACTIVE    Comment: (NOTE) Inconsistent with immunity, less than 10 mIU/mL.  Performed at [facility] Lab, [address], [place], [place] [address]   Hepatitis B surface antigen     Status: None   Collection Time: 07/04/23  2:55 AM  Result Value Ref Range   Hepatitis B Surface Ag NON REACTIVE NON REACTIVE    Comment: Performed at [facility] Lab, [address], [place], [place] [address]  Hepatitis C antibody     Status: None   Collection Time: 07/04/23  2:55 AM  Result Value Ref Range   HCV Ab NON REACTIVE NON REACTIVE    Comment: (NOTE) Nonreactive HCV antibody screen is consistent with no HCV infections,  unless recent infection is suspected or other evidence exists to indicate HCV infection.  Performed at [facility] Lab, [address], [place], [place] [address]   Lactate dehydrogenase     Status: None   Collection Time: 07/04/23  2:55 AM  Result Value Ref Range   LDH 173 98 - 192 U/L    Comment: Performed at [facility] Lab, [address], [place], [place] [address]  Lipid panel     Status: None   Collection Time: 07/04/23  2:55 AM  Result Value Ref Range   Cholesterol 115 0 - 200 mg/dL   Triglycerides 55 <849 mg/dL   HDL 46 >59 mg/dL   Total CHOL/HDL Ratio 2.5 RATIO   VLDL 11 0 - 40 mg/dL   LDL Cholesterol 58 0 - 99 mg/dL    Comment:        Total Cholesterol/HDL:CHD Risk Coronary Heart Disease Risk Table                     Men   Women  1/2 Average Risk   3.4   3.3  Average Risk       5.0   4.4  2 X Average Risk   9.6   7.1  3 X Average Risk  23.4   11.0        Use the calculated Patient Ratio above and the CHD Risk Table to determine the patient's CHD Risk.        ATP III CLASSIFICATION (LDL):  <100     mg/dL   Optimal  899-870  mg/dL   Near or Above                    Optimal  130-159  mg/dL   Borderline  839-810  mg/dL   High  >809     mg/dL   Very High Performed at [facility] Lab, [address] [place] [address] [place], [place] [address]   Prealbumin     Status: None   Collection Time: 07/04/23  2:55 AM  Result Value Ref Range   Prealbumin  19 18 - 38 mg/dL    Comment: Performed at [facility] Lab, [address] [place], [place]  [address]  PSA     Status: None   Collection Time: 07/04/23  2:55 AM  Result Value Ref Range   Prostatic Specific Antigen 0.52 0.00 - 4.00 ng/mL    Comment: (NOTE) While PSA levels of <=4.00 ng/ml are reported as reference range, some men with levels below 4.00 ng/ml can have prostate cancer and many men with PSA above 4.00 ng/ml do not have prostate cancer.  Other tests such as free PSA, age specific reference ranges, PSA velocity and PSA doubling time may be helpful especially in men less than 77 years old. Performed at [facility] Lab, [address], [place], [place] [address]   Uric acid     Status: None   Collection Time: 07/04/23  2:55 AM  Result Value Ref Range   Uric Acid, Serum 5.1 3.7 - 8.6 mg/dL    Comment: HEMOLYSIS AT THIS LEVEL MAY AFFECT RESULT Performed at [facility] Lab, [address], [place], [place] [address]   Protime-INR     Status: Abnormal   Collection Time: 07/04/23  2:55 AM  Result Value Ref Range   Prothrombin Time 23.4 (H) 11.4 - 15.2 seconds   INR 2.1 (H) 0.8 - 1.2    Comment: (NOTE) INR goal varies based on device and disease states. Performed at [facility] Lab, [address] [place], [place] [address]     VAS US  LOWER EXTREMITY VENOUS (DVT) Result Date: 07/04/2023  Lower Venous DVT Study Patient Name:  Denilson Iwan  Date of Exam:   07/04/2023 Medical Rec #: [id number]             Accession #:    [id number] Date of Birth: November 13, 1955            Patient Gender: M Patient Age:   52 years Exam Location:  [facility] Procedure:      VAS US  LOWER EXTREMITY VENOUS (DVT) Referring Phys: BENJAMIN STONER --------------------------------------------------------------------------------  Indications: Prevad workup.  Comparison Study: No priors. Performing Technologist: Ricka Sturdivant-Jones RDMS, RVT  Examination Guidelines: A complete  evaluation includes B-mode imaging, spectral Doppler, color Doppler, and power Doppler as needed of all accessible portions of each vessel. Bilateral testing is considered an integral part of a complete examination. Limited examinations for reoccurring indications may be performed as noted. The reflux portion of the exam is performed with the patient in reverse Trendelenburg.  +---------+---------------+---------+-----------+----------+--------------+ RIGHT    CompressibilityPhasicitySpontaneityPropertiesThrombus Aging +---------+---------------+---------+-----------+----------+--------------+ CFV      Full           Yes      Yes                                 +---------+---------------+---------+-----------+----------+--------------+ SFJ      Full                                                        +---------+---------------+---------+-----------+----------+--------------+ FV Prox  Full                                                        +---------+---------------+---------+-----------+----------+--------------+  FV Mid   Full                                                        +---------+---------------+---------+-----------+----------+--------------+ FV DistalFull                                                        +---------+---------------+---------+-----------+----------+--------------+ PFV      Full                                                        +---------+---------------+---------+-----------+----------+--------------+ POP      Full           Yes      Yes                                 +---------+---------------+---------+-----------+----------+--------------+ PTV      Full                                                        +---------+---------------+---------+-----------+----------+--------------+ PERO     Full                                                         +---------+---------------+---------+-----------+----------+--------------+   +---------+---------------+---------+-----------+----------+--------------+ LEFT     CompressibilityPhasicitySpontaneityPropertiesThrombus Aging +---------+---------------+---------+-----------+----------+--------------+ CFV      Full           Yes      Yes                                 +---------+---------------+---------+-----------+----------+--------------+ SFJ      Full                                                        +---------+---------------+---------+-----------+----------+--------------+ FV Prox  Full                                                        +---------+---------------+---------+-----------+----------+--------------+ FV Mid   Full                                                        +---------+---------------+---------+-----------+----------+--------------+  FV DistalFull                                                        +---------+---------------+---------+-----------+----------+--------------+ PFV      Full                                                        +---------+---------------+---------+-----------+----------+--------------+ POP      Full           Yes      Yes                                 +---------+---------------+---------+-----------+----------+--------------+ PTV      Full                                                        +---------+---------------+---------+-----------+----------+--------------+ PERO     Full                                                        +---------+---------------+---------+-----------+----------+--------------+     Summary: BILATERAL: - No evidence of deep vein thrombosis seen in the lower extremities, bilaterally. -No evidence of popliteal cyst, bilaterally.   *See table(s) above for measurements and observations. Electronically signed by Penne Colorado MD on 07/04/2023 at 5:02:47  PM.    Final    VAS US  DOPPLER PRE VAD Result Date: 07/04/2023 PERIOPERATIVE VASCULAR EVALUATION Patient Name:  TAGEN MILBY  Date of Exam:   07/04/2023 Medical Rec #: [id number]             Accession #:    [id number] Date of Birth: 1955-11-15            Patient Gender: M Patient Age:   82 years Exam Location:  [facility] Procedure:      VAS US  DOPPLER PRE VAD Referring Phys: BENJAMIN STONER --------------------------------------------------------------------------------  Risk Factors:  Hypertension, hyperlipidemia, Diabetes, coronary artery disease. Other Factors: CKD Stage II. Performing Technologist: Delon Fetch  Examination Guidelines: A complete evaluation includes B-mode imaging, spectral Doppler, color Doppler, and power Doppler as needed of all accessible portions of each vessel. Bilateral testing is considered an integral part of a complete examination. Limited examinations for reoccurring indications may be performed as noted.  Right Carotid Findings: +----------+--------+--------+--------+--------+------------------+           PSV cm/sEDV cm/sStenosisDescribeComments           +----------+--------+--------+--------+--------+------------------+ CCA Prox  40      14                                         +----------+--------+--------+--------+--------+------------------+ CCA Distal37      11  intimal thickening +----------+--------+--------+--------+--------+------------------+ ICA Prox  45      17      1-39%           intimal thickening +----------+--------+--------+--------+--------+------------------+ ICA Distal55      25                                         +----------+--------+--------+--------+--------+------------------+ ECA       81      13                                         +----------+--------+--------+--------+--------+------------------+ +----------+--------+-------+----------------+------------+            PSV cm/sEDV cmsDescribe        Arm Pressure +----------+--------+-------+----------------+------------+ [id number]             Multiphasic, WNL99           +----------+--------+-------+----------------+------------+ +---------+--------+--+--------+--+---------+ VertebralPSV cm/s70EDV cm/s18Antegrade +---------+--------+--+--------+--+---------+ Left Carotid Findings: +----------+--------+--------+--------+--------+------------------+           PSV cm/sEDV cm/sStenosisDescribeComments           +----------+--------+--------+--------+--------+------------------+ CCA Prox  41      10                                         +----------+--------+--------+--------+--------+------------------+ CCA Distal42      14                      intimal thickening +----------+--------+--------+--------+--------+------------------+ ICA Prox  34      13      1-39%           intimal thickening +----------+--------+--------+--------+--------+------------------+ ICA Distal51      17                                         +----------+--------+--------+--------+--------+------------------+ ECA       46      8                                          +----------+--------+--------+--------+--------+------------------+ +----------+--------+--------+----------------+------------+ SubclavianPSV cm/sEDV cm/sDescribe        Arm Pressure +----------+--------+--------+----------------+------------+           54              Multiphasic, WNL98           +----------+--------+--------+----------------+------------+ +---------+--------+--+--------+--+---------+ VertebralPSV cm/s34EDV cm/s17Antegrade +---------+--------+--+--------+--+---------+  ABI Findings: +---------+------------------+-----+----------+--------+ Right    Rt Pressure (mmHg)IndexWaveform  Comment  +---------+------------------+-----+----------+--------+ Brachial 99                      triphasic          +---------+------------------+-----+----------+--------+ ATA      82                0.83 monophasic         +---------+------------------+-----+----------+--------+ PTA      109               1.10 triphasic          +---------+------------------+-----+----------+--------+  Great Toe51                     Abnormal           +---------+------------------+-----+----------+--------+ +---------+------------------+-----+---------+-------+ Left     Lt Pressure (mmHg)IndexWaveform Comment +---------+------------------+-----+---------+-------+ Brachial 98                     triphasic        +---------+------------------+-----+---------+-------+ ATA      112               1.13 triphasic        +---------+------------------+-----+---------+-------+ PTA      123               1.24 triphasic        +---------+------------------+-----+---------+-------+ Burnetta Gibbons                     Abnormal         +---------+------------------+-----+---------+-------+ +-------+---------------+----------------+ ABI/TBIToday's ABI/TBIPrevious ABI/TBI +-------+---------------+----------------+ Right  1.10 / 0.52                     +-------+---------------+----------------+ Left   1.13 / 0.68                     +-------+---------------+----------------+  Summary: Right Carotid: Velocities in the right ICA are consistent with a 1-39% stenosis. Left Carotid: Velocities in the left ICA are consistent with a 1-39% stenosis. Vertebrals:  Bilateral vertebral arteries demonstrate antegrade flow. Subclavians: Normal flow hemodynamics were seen in bilateral subclavian              arteries.  *See table(s) above for measurements and observations. Right ABI: Resting right ankle-brachial index is within normal range. The right toe-brachial index is abnormal. Left ABI: Resting left ankle-brachial index is within normal range. The left toe-brachial index is abnormal.   Electronically signed by Penne Colorado MD on 07/04/2023 at 5:02:29 PM.    Final    DG Orthopantogram Result Date: 07/03/2023 CLINICAL DATA:  [id number] Preoperative evaluation to rule out surgical contraindication [id number] EXAM: ORTHOPANTOGRAM/PANORAMIC COMPARISON:  None Available. FINDINGS: Numerous missing mandibular and maxillary teeth. No visible periapical abscess. No acute bony abnormality. Visualized paranasal sinuses clear. IMPRESSION: No acute findings. Electronically Signed   By: Franky Crease M.D.   On: 07/03/2023 17:00   ECHOCARDIOGRAM COMPLETE Result Date: 07/03/2023    ECHOCARDIOGRAM REPORT   Patient Name:   Tobi Tripodi Date of Exam: 07/03/2023 Medical Rec #:  [id number]            Height:       72.0 in Accession #:    [id number]           Weight:       140.4 lb Date of Birth:  March 18, 1955           BSA:          1.833 m Patient Age:    41 years             BP:           105/73 mmHg Patient Gender: M                    HR:           66 bpm. Exam Location:  Inpatient Procedure: 2D Echo (Both Spectral and Color Flow Doppler were utilized during  procedure). Indications:    heart failure. LVAD evaluation.  History:        Patient has prior history of Echocardiogram examinations, most                 recent 03/30/2023. CAD, Defibrillator, chronic kidney disease,                 Arrythmias:Atrial Fibrillation, Signs/Symptoms:Edema and Chest                 Pain; Risk Factors:Hypertension, Diabetes and Dyslipidemia.  Sonographer:    Tinnie Barefoot RDCS Referring Phys: [phone] ALMA L DIAZ IMPRESSIONS  1. Left ventricular ejection fraction, by estimation, is 20 to 25%. The left ventricle has severely decreased function. The left ventricle demonstrates global hypokinesis. The left ventricular internal cavity size was mildly dilated. Left ventricular diastolic parameters are consistent with Grade III diastolic dysfunction (restrictive).  2. Right ventricular systolic function is moderately  reduced. The right ventricular size is normal. There is moderately elevated pulmonary artery systolic pressure. The estimated right ventricular systolic pressure is 44.0 mmHg.  3. Left atrial size was severely dilated.  4. The mitral valve is degenerative. Mild to moderate mitral valve regurgitation. No evidence of mitral stenosis.  5. Tricuspid valve regurgitation is mild to moderate.  6. The aortic valve is tricuspid. Aortic valve regurgitation is not visualized. No aortic stenosis is present.  7. The inferior vena cava is normal in size with <50% respiratory variability, suggesting right atrial pressure of 8 mmHg. FINDINGS  Left Ventricle: Left ventricular ejection fraction, by estimation, is 20 to 25%. The left ventricle has severely decreased function. The left ventricle demonstrates global hypokinesis. The left ventricular internal cavity size was mildly dilated. There is no left ventricular hypertrophy. Left ventricular diastolic parameters are consistent with Grade III diastolic dysfunction (restrictive). Right Ventricle: The right ventricular size is normal. No increase in right ventricular wall thickness. Right ventricular systolic function is moderately reduced. There is moderately elevated pulmonary artery systolic pressure. The tricuspid regurgitant velocity is 3.00 m/s, and with an assumed right atrial pressure of 8 mmHg, the estimated right ventricular systolic pressure is 44.0 mmHg. Left Atrium: Left atrial size was severely dilated. Right Atrium: Right atrial size was normal in size. Pericardium: There is no evidence of pericardial effusion. Mitral Valve: The mitral valve is degenerative in appearance. Moderately decreased mobility of the mitral valve leaflets. Mild to moderate mitral valve regurgitation. No evidence of mitral valve stenosis. Tricuspid Valve: The tricuspid valve is normal in structure. Tricuspid valve regurgitation is mild to moderate. No evidence of tricuspid stenosis. Aortic  Valve: The aortic valve is tricuspid. Aortic valve regurgitation is not visualized. No aortic stenosis is present. Pulmonic Valve: The pulmonic valve was normal in structure. Pulmonic valve regurgitation is mild. No evidence of pulmonic stenosis. Aorta: The aortic root is normal in size and structure. Venous: The inferior vena cava is normal in size with less than 50% respiratory variability, suggesting right atrial pressure of 8 mmHg. IAS/Shunts: No atrial level shunt detected by color flow Doppler. Additional Comments: A device lead is visualized.  LEFT VENTRICLE PLAX 2D LVIDd:         5.60 cm      Diastology LVIDs:         4.70 cm      LV e' medial:  4.46 cm/s LV PW:         0.90 cm      LV e' lateral: 10.00 cm/s LV IVS:  1.00 cm LVOT diam:     1.80 cm LV SV:         34 LV SV Index:   18 LVOT Area:     2.54 cm  LV Volumes (MOD) LV vol d, MOD A4C: 149.0 ml LV vol s, MOD A4C: 111.0 ml LV SV MOD A4C:     149.0 ml RIGHT VENTRICLE            IVC RV Basal diam:  3.30 cm    IVC diam: 1.90 cm RV S prime:     9.25 cm/s TAPSE (M-mode): 1.3 cm LEFT ATRIUM              Index        RIGHT ATRIUM           Index LA diam:        4.70 cm  2.56 cm/m   RA Area:     17.90 cm LA Vol (A2C):   112.0 ml 61.09 ml/m  RA Volume:   53.80 ml  29.35 ml/m LA Vol (A4C):   89.7 ml  48.93 ml/m LA Biplane Vol: 100.0 ml 54.55 ml/m  AORTIC VALVE LVOT Vmax:   75.50 cm/s LVOT Vmean:  48.200 cm/s LVOT VTI:    0.132 m  AORTA Ao Root diam: 3.50 cm Ao Asc diam:  3.90 cm MITRAL VALVE              TRICUSPID VALVE MV Area VTI: 0.30 cm     TV Peak grad:   36.2 mmHg MV VTI:      1.13 m       TV Vmax:        3.01 m/s MR Peak grad: 58.1 mmHg   TR Peak grad:   36.0 mmHg MR Vmax:      381.00 cm/s TR Vmax:        300.00 cm/s                            SHUNTS                           Systemic VTI:  0.13 m                           Systemic Diam: 1.80 cm Morene Brownie Electronically signed by Morene Brownie Signature Date/Time: 07/03/2023/4:54:56 PM     Final     Review of Systems  Constitutional:  Positive for activity change, appetite change, fatigue and unexpected weight change. Negative for chills and fever.  HENT: Negative.    Eyes: Negative.   Respiratory:  Positive for shortness of breath.   Cardiovascular:  Positive for leg swelling. Negative for chest pain and palpitations.  Gastrointestinal:  Positive for abdominal distention. Negative for abdominal pain, constipation, diarrhea, nausea and vomiting.  Endocrine: Negative.   Genitourinary: Negative.   Musculoskeletal: Negative.   Skin: Negative.   Allergic/Immunologic: Negative.   Neurological:  Negative for dizziness and syncope.  Hematological: Negative.   Psychiatric/Behavioral: Negative.     Blood pressure 97/67, pulse 64, temperature 97.6 F (36.4 C), temperature source Oral, resp. rate 15, height 6' (1.829 m), weight 64.2 kg, SpO2 100%. Physical Exam Constitutional:      Appearance: He is well-developed and normal weight.  HENT:     Head: Normocephalic and atraumatic.     Mouth/Throat:  Comments: Multiple missing teeth  Eyes:     Extraocular Movements: Extraocular movements intact.     Conjunctiva/sclera: Conjunctivae normal.     Pupils: Pupils are equal, round, and reactive to light.   Neck:     Vascular: No carotid bruit.   Cardiovascular:     Rate and Rhythm: Normal rate and regular rhythm.     Pulses: Normal pulses.     Heart sounds: Normal heart sounds. No murmur heard. Pulmonary:     Effort: Pulmonary effort is normal.     Breath sounds: Normal breath sounds.  Abdominal:     General: Abdomen is flat. There is no distension.     Palpations: Abdomen is soft.     Tenderness: There is no abdominal tenderness.   Musculoskeletal:        General: No swelling.     Cervical back: Normal range of motion and neck supple.   Skin:    General: Skin is warm and dry.   Neurological:     General: No focal deficit present.     Mental Status: He is  alert and oriented to person, place, and time.   Psychiatric:        Mood and Affect: Mood normal.        Behavior: Behavior normal.    ECHOCARDIOGRAM REPORT       Patient Name:   Sayan Yamamoto Date of Exam: 07/03/2023  Medical Rec #:  [id number]            Height:       72.0 in  Accession #:    [id number]           Weight:       140.4 lb  Date of Birth:  [birth year]           BSA:          1.833 m  Patient Age:    30 years             BP:           105/73 mmHg  Patient Gender: M                    HR:           66 bpm.  Exam Location:  Inpatient   Procedure: 2D Echo (Both Spectral and Color Flow Doppler were utilized  during            procedure).   Indications:    heart failure. LVAD evaluation.    History:        Patient has prior history of Echocardiogram examinations,  most                 recent 03/30/2023. CAD, Defibrillator, chronic kidney  disease,                 Arrythmias:Atrial Fibrillation, Signs/Symptoms:Edema and  Chest                 Pain; Risk Factors:Hypertension, Diabetes and  Dyslipidemia.    Sonographer:   Tinnie Barefoot RDCS  Referring Phys: [phone] ALMA L DIAZ   IMPRESSIONS     1. Left ventricular ejection fraction, by estimation, is 20 to 25%. The  left ventricle has severely decreased function. The left ventricle  demonstrates global hypokinesis. The left ventricular internal cavity size  was mildly dilated. Left ventricular  diastolic parameters are consistent with Grade III diastolic dysfunction  (restrictive).   2.  Right ventricular systolic function is moderately reduced. The right  ventricular size is normal. There is moderately elevated pulmonary artery  systolic pressure. The estimated right ventricular systolic pressure is  44.0 mmHg.   3. Left atrial size was severely dilated.   4. The mitral valve is degenerative. Mild to moderate mitral valve  regurgitation. No evidence of mitral stenosis.   5. Tricuspid valve  regurgitation is mild to moderate.   6. The aortic valve is tricuspid. Aortic valve regurgitation is not  visualized. No aortic stenosis is present.   7. The inferior vena cava is normal in size with <50% respiratory  variability, suggesting right atrial pressure of 8 mmHg.   FINDINGS   Left Ventricle: Left ventricular ejection fraction, by estimation, is 20  to 25%. The left ventricle has severely decreased function. The left  ventricle demonstrates global hypokinesis. The left ventricular internal  cavity size was mildly dilated. There  is no left ventricular hypertrophy. Left ventricular diastolic parameters  are consistent with Grade III diastolic dysfunction (restrictive).   Right Ventricle: The right ventricular size is normal. No increase in  right ventricular wall thickness. Right ventricular systolic function is  moderately reduced. There is moderately elevated pulmonary artery systolic  pressure. The tricuspid regurgitant  velocity is 3.00 m/s, and with an assumed right atrial pressure of 8 mmHg,  the estimated right ventricular systolic pressure is 44.0 mmHg.   Left Atrium: Left atrial size was severely dilated.   Right Atrium: Right atrial size was normal in size.   Pericardium: There is no evidence of pericardial effusion.   Mitral Valve: The mitral valve is degenerative in appearance. Moderately  decreased mobility of the mitral valve leaflets. Mild to moderate mitral  valve regurgitation. No evidence of mitral valve stenosis.   Tricuspid Valve: The tricuspid valve is normal in structure. Tricuspid  valve regurgitation is mild to moderate. No evidence of tricuspid  stenosis.   Aortic Valve: The aortic valve is tricuspid. Aortic valve regurgitation is  not visualized. No aortic stenosis is present.   Pulmonic Valve: The pulmonic valve was normal in structure. Pulmonic valve  regurgitation is mild. No evidence of pulmonic stenosis.   Aorta: The aortic root is  normal in size and structure.   Venous: The inferior vena cava is normal in size with less than 50%  respiratory variability, suggesting right atrial pressure of 8 mmHg.   IAS/Shunts: No atrial level shunt detected by color flow Doppler.   Additional Comments: A device lead is visualized.     LEFT VENTRICLE  PLAX 2D  LVIDd:         5.60 cm      Diastology  LVIDs:         4.70 cm      LV e' medial:  4.46 cm/s  LV PW:         0.90 cm      LV e' lateral: 10.00 cm/s  LV IVS:        1.00 cm  LVOT diam:     1.80 cm  LV SV:         34  LV SV Index:   18  LVOT Area:     2.54 cm    LV Volumes (MOD)  LV vol d, MOD A4C: 149.0 ml  LV vol s, MOD A4C: 111.0 ml  LV SV MOD A4C:     149.0 ml   RIGHT VENTRICLE  IVC  RV Basal diam:  3.30 cm    IVC diam: 1.90 cm  RV S prime:     9.25 cm/s  TAPSE (M-mode): 1.3 cm   LEFT ATRIUM              Index        RIGHT ATRIUM           Index  LA diam:        4.70 cm  2.56 cm/m   RA Area:     17.90 cm  LA Vol (A2C):   112.0 ml 61.09 ml/m  RA Volume:   53.80 ml  29.35 ml/m  LA Vol (A4C):   89.7 ml  48.93 ml/m  LA Biplane Vol: 100.0 ml 54.55 ml/m   AORTIC VALVE  LVOT Vmax:   75.50 cm/s  LVOT Vmean:  48.200 cm/s  LVOT VTI:    0.132 m    AORTA  Ao Root diam: 3.50 cm  Ao Asc diam:  3.90 cm   MITRAL VALVE              TRICUSPID VALVE  MV Area VTI: 0.30 cm     TV Peak grad:   36.2 mmHg  MV VTI:      1.13 m       TV Vmax:        3.01 m/s  MR Peak grad: 58.1 mmHg   TR Peak grad:   36.0 mmHg  MR Vmax:      381.00 cm/s TR Vmax:        300.00 cm/s                              SHUNTS                            Systemic VTI:  0.13 m                            Systemic Diam: 1.80 cm   Morene Brownie  Electronically signed by Morene Brownie  Signature Date/Time: 07/03/2023/4:54:56 PM        Final      Physicians  Panel Physicians Referring Physician Case Authorizing Physician  Wonda Sharper, MD (Primary)      Procedures  Coronary/Graft Acute MI Revascularization  LEFT HEART CATH AND CORONARY ANGIOGRAPHY   Conclusion      2nd Diag lesion is 100% stenosed.   1.  Acute anterior STEMI, secondary to total occlusion of the proximal LAD.  Primary PCI performed with a 3.5 x 38 mm Synergy DES.  Baseline stenosis 100% with TIMI 0 flow.  Post-PCI stenosis 0% with TIMI-3 flow.  The second diagonal branch is totally occluded both pre and post PCI.  This branch was previously shown to be severely diseased. 2.  Continued patency of the mid RCA stent with moderate distal RCA stenosis of 60 to 70% 3.  Severe stenosis of a tortuous ramus intermedius branch, unchanged from the previous procedure 4.  Severe diffuse distal circumflex stenosis and a diabetic distal diffuse pattern 5.  Moderately elevated LVEDP   Recommendations: Resume aspirin  and ticagrelor  to be continued without interruption for at least 12 months.  Consider long-term P2 Y12 inhibition in this diabetic patient with extensive coronary artery disease, now presenting with a second STEMI event in a different vessel.  Otherwise routine post MI medical therapy, aggressive risk reduction measures, and check inpatient 2D echocardiogram to assess degree of LV dysfunction.   Recommendations  Antiplatelet/Anticoag Recommend uninterrupted dual antiplatelet therapy with Aspirin  81mg  daily and Ticagrelor  90mg  twice daily for a minimum of 12 months (ACS-Class I recommendation).   Surgeon Notes    12/14/2022  3:15 PM CV Procedure signed by Kate Lonni CROME, MD   Indications  ST elevation myocardial infarction involving left anterior descending (LAD) coronary artery (HCC) [I21.02 (ICD-10-CM)]   Procedural Details  Technical Details INDICATION: Anterior STEMI: 68 year old male with known CAD, inferior STEMI about 1 year ago treated with primary PCI of the RCA.  He now presents with an anterior STEMI.  Discontinued ticagrelor  about 3 months ago.   Plan to proceed with emergency cardiac catheterization and PCI.  PROCEDURAL DETAILS: The right wrist is prepped, draped, and anesthetized with 1% lidocaine . Using the modified Seldinger technique, a 5/6 French Slender sheath is introduced into the right radial artery. 3 mg of verapamil  is administered through the sheath, weight-based unfractionated heparin  was administered intravenously. Standard Judkins catheters are used for selective coronary angiography. LV pressure is recorded and an aortic valve pullback gradient is measured.  PCI is performed after the diagnostic procedure.  An EBU 3.5 cm guide catheter is used for the PCI procedure.  Heparin  is administered and a therapeutic ACT greater than 250 seconds is achieved.  Aggrastat  is administered via bolus and drip per protocol.  The LAD is totally occluded and the lesion is crossed with a Prowater.  The lesion is predilated with a 2.5 mm balloon and then stented with a 3.5 x 38 mm Synergy DES that is postdilated to high-pressure with a 3.75 mm noncompliant balloon.  The patient tolerated the procedure well, remained hemodynamically stable, and had no immediate complications.  Catheter exchanges are performed over an exchange length guidewire. A TR band is used for radial hemostasis at the completion of the procedure.        Estimated blood loss <50 mL.   During this procedure medications were administered to achieve and maintain moderate conscious sedation while the patient's heart rate, blood pressure, and oxygen saturation were continuously monitored and I was present face-to-face 100% of this time.   Medications (Filter: Administrations occurring from 1721 to 1810 on 12/10/22)  important  Continuous medications are totaled by the amount administered until 12/10/22 1810.   Heparin  (Porcine) in NaCl 1000-0.9 UT/500ML-% SOLN (mL)  Total volume: 1,000 mL Date/Time Rate/Dose/Volume Action   12/10/22 1726 500 mL Given   1726 500 mL Given    lidocaine  (PF) (XYLOCAINE ) 1 % injection (mL)  Total volume: 2 mL Date/Time Rate/Dose/Volume Action   12/10/22 1728 2 mL Given   midazolam  (VERSED ) injection (mg)  Total dose: 2 mg Date/Time Rate/Dose/Volume Action   12/10/22 1729 1 mg Given   1749 1 mg Given   fentaNYL  (SUBLIMAZE ) injection (mcg)  Total dose: 50 mcg Date/Time Rate/Dose/Volume Action   12/10/22 1729 25 mcg Given   1749 25 mcg Given   Radial Cocktail/Verapamil  only (mL)  Total volume: 10 mL Date/Time Rate/Dose/Volume Action   12/10/22 1730 10 mL Given   heparin  sodium (porcine) injection (Units)  Total dose: 6,000 Units Date/Time Rate/Dose/Volume Action   12/10/22 1733 6,000 Units Given   tirofiban  (AGGRASTAT ) bolus via infusion (mcg/kg)  Total dose: 2,007.5 mcg Dosing weight: 80.3 Date/Time Rate/Dose/Volume Action   12/10/22 1744 2,007.5 mcg Given   tirofiban  (AGGRASTAT ) infusion 50 mcg/mL 100 mL (  mcg/kg/min)  Total dose: Cannot be calculated* Dosing weight: 80.3 *Continuous medication not stopped within the calculation time range. Date/Time Rate/Dose/Volume Action   12/10/22 1749 0.15 mcg/kg/min - 14.45 mL/hr New Bag/Given   iohexol  (OMNIPAQUE ) 350 MG/ML injection (mL)  Total volume: 100 mL Date/Time Rate/Dose/Volume Action   12/10/22 1759 100 mL Given   ticagrelor  (BRILINTA ) tablet (mg)  Total dose: 180 mg Date/Time Rate/Dose/Volume Action   12/10/22 1802 180 mg Given   nitroGLYCERIN  (NITROSTAT ) SL tablet 0.4 mg (mg)  Total dose: Cannot be calculated* Dosing weight: 80.3 *Administration dose not documented Date/Time Rate/Dose/Volume Action   12/10/22 1721 *Not included in total MAR Hold   ticagrelor  (BRILINTA ) tablet 180 mg (mg)  Total dose: Cannot be calculated* Dosing weight: 80.3 *Administration dose not documented Date/Time Rate/Dose/Volume Action   12/10/22 1721 *Not included in total MAR Hold    Sedation Time  Sedation Time Physician-1: 25 minutes 33 seconds Contrast      Administrations occurring from 1721 to 1810 on 12/10/22:  Medication Name Total Dose  iohexol  (OMNIPAQUE ) 350 MG/ML injection 100 mL   Radiation/Fluoro  Fluoro time: 5.1 (min) DAP: 23015 (mGycm2) Cumulative Air Kerma: 431 (mGy) Complications  Complications documented before study signed (12/10/2022  6:36 PM)   No complications were associated with this study.  Documented by Teresa Mardy NOVAK, RN - 12/10/2022  6:02 PM     Coronary Findings  Diagnostic Dominance: Right Left Anterior Descending  Prox LAD to Mid LAD lesion is 100% stenosed with 100% stenosed side branch in 2nd Sept.    First Diagonal Branch  Vessel is small in size.  1st Diag lesion is 90% stenosed.    Second Diagonal Branch  Vessel is small in size.  2nd Diag lesion is 100% stenosed.    Left Circumflex  Mid Cx lesion is 30% stenosed.  Dist Cx lesion is 95% stenosed.    First Obtuse Marginal Branch  1st Mrg lesion is 90% stenosed.    Second Obtuse Marginal Branch  Vessel is small in size.    Third Obtuse Marginal Branch  3rd Mrg lesion is 99% stenosed.    Right Coronary Artery  Prox RCA lesion is 20% stenosed.  Non-stenotic Prox RCA to Mid RCA lesion was previously treated. The lesion is type C, irregular and thrombotic.  Dist RCA lesion is 60% stenosed.    Intervention   Prox LAD to Mid LAD lesion with side branch in 2nd Sept  Stent - Main Branch  CATH LAUNCHER 6FR EBU3.5 guide catheter was inserted. Lesion crossed with guidewire using a WIRE ASAHI PROWATER 180CM. Pre-stent angioplasty was performed using a BALLN EMERGE MR 2.5X12. A drug-eluting stent was successfully placed using a SYNERGY XD 3.50X38. Post-stent angioplasty was performed using a BALLN [place] EMERGE MR 3.75X20. Maximum pressure: 18 atm.  Post-Intervention Lesion Assessment  The intervention was successful. Pre-interventional TIMI flow is 0. Post-intervention TIMI flow is 3. No complications occurred at this lesion.  There is a 0%  residual stenosis in the main branch post intervention. There is a 0% residual stenosis in the side branch post intervention.     Left Heart  Left Ventricle LV end diastolic pressure is moderately elevated.   Coronary Diagrams  Diagnostic Dominance: Right  Intervention    Implants   Permanent Stent  Synergy Xd 3.50x38 - [id number] - Implanted  Inventory item: CLEDA OSWALD STAPLER 6.49K61 Model/Cat number: [id number]  Manufacturer: [employer] Lot number: [id number]  Device identifier: [id number] Device identifier type: GS1  GUDID Information  Request status Successful    Brand name: LORRIN STAPLER Version/Model: [id number]  Company name: [employer] MRI safety info as of 12/10/22: MR Conditional  Contains dry or latex rubber: No    GMDN P.T. name: Drug-eluting coronary artery stent, non-bioabsorbable-polymer-coated    As of 12/10/2022  Status: Implanted     Syngo Images   Show images for CARDIAC CATHETERIZATION Images on Long Term Storage   Show images for Gouveia, Rama Gamal Link to Procedure Log  Procedure Log    Hemo Data  Flowsheet Row Most Recent Value  AO Systolic Pressure 118 mmHg  AO Diastolic Pressure 79 mmHg  AO Mean 98 mmHg  LV Systolic Pressure 117 mmHg  LV Diastolic Pressure 22 mmHg  LV EDP 28 mmHg  Arterial Occlusion Pressure Extended Systolic Pressure 119 mmHg  Arterial Occlusion Pressure Extended Diastolic Pressure 79 mmHg  Arterial Occlusion Pressure Extended Mean Pressure 94 mmHg  Left Ventricular Apex Extended Systolic Pressure 120 mmHg  LVp Diastolic Pressure 36 mmHg  Left Ventricular Apex Extended EDP Pressure 29 mmHg   Narrative & Impression  CLINICAL DATA:  [id number] Preoperative evaluation to rule out surgical contraindication [id number]   EXAM: ORTHOPANTOGRAM/PANORAMIC   COMPARISON:  None Available.   FINDINGS: Numerous missing mandibular and maxillary teeth. No visible periapical  abscess. No acute bony abnormality. Visualized paranasal sinuses clear.   IMPRESSION: No acute findings.     Electronically Signed   By: Franky Crease M.D.   On: 07/03/2023 17:00     Assessment/Plan:  This 68 year old gentleman has ischemic cardiomyopathy with ejection fraction of 20 to 25% presenting with acute on chronic systolic congestive heart failure, NYHA IIIb, with worsening shortness of breath and fatigue.  He has had recurrent admissions for acute decompensation since February 2025 and has low cardiac output on right heart cath.  GDMT has been limited due to hypotension requiring midodrine .  He has lost over 30 pounds since December for unclear reasons but it could be related to low output.  He feels like his weight has stabilized.  I think he is reaching the point where advanced therapies will be needed.  Given his age of 32 I am not sure that he would be a transplant candidate but that could be considered.  I think he would be a reasonable candidate for LVAD therapy although he does have moderate RV dysfunction.  He has moderate pulmonary hypertension and hopefully LVAD therapy would reduce those PA pressures and put less stress on the RV.  His PAPi was 8.2.  I discussed the indications for advanced therapies and the alternatives of transplantation, LVAD implantation, home inotropic therapy, and palliative care.  I discussed the operative procedure of LVAD implantation and the benefits and risks.  All of his questions have been answered at this time.  He is not sure where he wants to go from here but would like to think about it further.  He will be discussed at our multidisciplinary medical review board to decide about his candidacy for LVAD therapy.  Dorise MARLA Fellers, MD 07/04/2023

## 2023-07-04 NOTE — Progress Notes (Signed)
 Initial Nutrition Assessment  DOCUMENTATION CODES:   Severe malnutrition in context of chronic illness  INTERVENTION:  Glucerna Shake po TID, each supplement provides 220 kcal and 10 grams of protein  Liberalize diet from heart healthy to regular to provide increased options and promote adequate intake of calories and protein  Discussed High Calorie, High-Protein Nutrition Therapy handout from the Academy of [employer] and Dietetics  Provided coupons for ONS once discharged to help optimize nutrition prior to LVAD surgery  NUTRITION DIAGNOSIS:  Severe Malnutrition related to chronic illness (atrial fibrilation, STEMI x 2, CHF) as evidenced by severe fat depletion, severe muscle depletion, energy intake < or equal to 75% for > or equal to 1 month, percent weight loss.  GOAL:  Patient will meet greater than or equal to 90% of their needs  MONITOR:  PO intake, Supplement acceptance, Weight trends  REASON FOR ASSESSMENT:  Consult Assessment of nutrition requirement/status  ASSESSMENT:   Pt with hx of CHF, ischemic cardiomyopathy, type 2 diabetes, HTN, CAD,  and atrial fibrillation. Hx of STEMI 10/2021 and 11/2022. Admitted for SOB and chest pain, underwent lasix  treatment and being assessed for LVAD.  Pt on torsemide  to help with fluid retention. Workup for LVAD in progress.   Pt awake and alert at time of assessment. Pt's daughter at bedside with him. Pt reports low appetite and only ate a few bites of breakfast. Reports his appetite comes and goes but he tries to force himself to eat. Diet summary shows 54% intake over 5 meals. Discussed ordering Glucerna while admitted to help with intake, pt agreeable to supplement. No recent glucose labs in chart, but labs from 6/22 showed hyperglycemia and daughter expressed concern for his glucose levels and was hesitant about other nutrition supplements. Discussed liberalizing diet to give pt more options to encourage adequate intake.  PTA,  pt's appetite was also poor. Pt would only eat bites of food throughout the day, but heavily relied on smoothies and nutrition shakes for intake due to weight loss concern. Pt has lost 19% weight since 12/2022 which is clinically significant for severe malnutrition. Pt reports since his issues began with his heart in July 2024, he has had a decline and does not ever feel hungry enough to eat. Pt reports his mobility status is still independent and he does not require assistance walking, but has noticed weakness and decline in muscle strength. Pt's daughter reports trying to make smoothies or anything that he can drink since pt tends to tolerate liquids more. Discussed tips for adding calories and protein to smoothies, snacks, and broths. Discussed ONS options for pt once discharged. Encouraged optimizing protein and calorie intake prior to LVAD surgery to help with recovery. Provided education handout on high protein, high calorie tips from the Academy of Nutrition and Dietetics.   Medications reviewed and include:  Amiodarone  Levothyroxine  Protonix  Potassium chloride   Torsemide  Jardiance    Labs reviewed  NUTRITION - FOCUSED PHYSICAL EXAM:  Flowsheet Row Most Recent Value  Orbital Region Moderate depletion  Upper Arm Region Severe depletion  Thoracic and Lumbar Region Severe depletion  Buccal Region Severe depletion  Temple Region Severe depletion  Clavicle Bone Region Severe depletion  Clavicle and Acromion Bone Region Severe depletion  Scapular Bone Region Severe depletion  Dorsal Hand Severe depletion  Patellar Region Severe depletion  Anterior Thigh Region Severe depletion  Posterior Calf Region Severe depletion  Edema (RD Assessment) None  Hair Reviewed  Eyes Reviewed  Mouth Reviewed  Skin Reviewed  Nails  Reviewed    Diet Order:   Diet Order             Diet regular Room service appropriate? Yes; Fluid consistency: Thin  Diet effective now           Diet - low sodium  heart healthy                   EDUCATION NEEDS:   Education needs have been addressed  Skin:  Skin Assessment: Reviewed RN Assessment  Last BM:  6/22  Height:  Ht Readings from Last 1 Encounters:  06/30/23 6' (1.829 m)   Weight:  Wt Readings from Last 1 Encounters:  07/04/23 64.2 kg   Ideal Body Weight:  80.1 kg  BMI:  Body mass index is 19.2 kg/m.  Estimated Nutritional Needs:   Kcal:  1800-2000  Protein:  75-90g  Fluid:  1.8-2L   Josette Glance, MS, RDN, LDN Clinical Dietitian I Please reach out via secure chat

## 2023-07-05 ENCOUNTER — Inpatient Hospital Stay (HOSPITAL_COMMUNITY)

## 2023-07-05 ENCOUNTER — Other Ambulatory Visit (HOSPITAL_COMMUNITY): Payer: Self-pay

## 2023-07-05 DIAGNOSIS — Z515 Encounter for palliative care: Secondary | ICD-10-CM | POA: Diagnosis not present

## 2023-07-05 DIAGNOSIS — I5023 Acute on chronic systolic (congestive) heart failure: Secondary | ICD-10-CM | POA: Diagnosis not present

## 2023-07-05 DIAGNOSIS — I5020 Unspecified systolic (congestive) heart failure: Secondary | ICD-10-CM | POA: Diagnosis not present

## 2023-07-05 DIAGNOSIS — Z7189 Other specified counseling: Secondary | ICD-10-CM | POA: Diagnosis not present

## 2023-07-05 LAB — PULMONARY FUNCTION TEST
DL/VA % pred: 97 %
DL/VA: 3.96 ml/min/mmHg/L
DLCO cor % pred: 49 %
DLCO cor: 13.93 ml/min/mmHg
DLCO unc % pred: 49 %
DLCO unc: 13.85 ml/min/mmHg
FEF 25-75 Post: 1.81 L/s
FEF 25-75 Pre: 3.51 L/s
FEF2575-%Change-Post: -48 %
FEF2575-%Pred-Post: 64 %
FEF2575-%Pred-Pre: 124 %
FEV1-%Change-Post: -20 %
FEV1-%Pred-Post: 55 %
FEV1-%Pred-Pre: 69 %
FEV1-Post: 2 L
FEV1-Pre: 2.53 L
FEV1FVC-%Change-Post: -21 %
FEV1FVC-%Pred-Pre: 127 %
FEV6-%Change-Post: 0 %
FEV6-%Pred-Post: 57 %
FEV6-%Pred-Pre: 57 %
FEV6-Post: 2.67 L
FEV6-Pre: 2.66 L
FEV6FVC-%Pred-Post: 105 %
FEV6FVC-%Pred-Pre: 105 %
FVC-%Change-Post: 0 %
FVC-%Pred-Post: 54 %
FVC-%Pred-Pre: 54 %
FVC-Post: 2.67 L
FVC-Pre: 2.66 L
Post FEV1/FVC ratio: 75 %
Post FEV6/FVC ratio: 100 %
Pre FEV1/FVC ratio: 95 %
Pre FEV6/FVC Ratio: 100 %
RV % pred: 90 %
RV: 2.26 L
TLC % pred: 63 %
TLC: 4.7 L

## 2023-07-05 LAB — TSH: TSH: 14.882 u[IU]/mL — ABNORMAL HIGH (ref 0.350–4.500)

## 2023-07-05 LAB — BASIC METABOLIC PANEL WITH GFR
Anion gap: 14 (ref 5–15)
BUN: 30 mg/dL — ABNORMAL HIGH (ref 8–23)
CO2: 22 mmol/L (ref 22–32)
Calcium: 9.2 mg/dL (ref 8.9–10.3)
Chloride: 101 mmol/L (ref 98–111)
Creatinine, Ser: 1.39 mg/dL — ABNORMAL HIGH (ref 0.61–1.24)
GFR, Estimated: 56 mL/min — ABNORMAL LOW (ref >60)
Glucose, Bld: 123 mg/dL — ABNORMAL HIGH (ref 70–99)
Potassium: 4.3 mmol/L (ref 3.5–5.1)
Sodium: 137 mmol/L (ref 135–145)

## 2023-07-05 LAB — CBC
HCT: 44.8 % (ref 39.0–52.0)
Hemoglobin: 14.4 g/dL (ref 13.0–17.0)
MCH: 28.6 pg (ref 26.0–34.0)
MCHC: 32.1 g/dL (ref 30.0–36.0)
MCV: 89.1 fL (ref 80.0–100.0)
Platelets: 245 10*3/uL (ref 150–400)
RBC: 5.03 MIL/uL (ref 4.22–5.81)
RDW: 14.6 % (ref 11.5–15.5)
WBC: 5.7 10*3/uL (ref 4.0–10.5)
nRBC: 0 % (ref 0.0–0.2)

## 2023-07-05 LAB — T4, FREE: Free T4: 1.03 ng/dL (ref 0.61–1.12)

## 2023-07-05 LAB — HEPATITIS B SURFACE ANTIBODY, QUANTITATIVE: Hep B S AB Quant (Post): <3.5 m[IU]/mL — ABNORMAL LOW

## 2023-07-05 LAB — MAGNESIUM: Magnesium: 2.3 mg/dL (ref 1.7–2.4)

## 2023-07-05 LAB — HEPATITIS B CORE ANTIBODY, TOTAL: HEP B CORE AB: NEGATIVE

## 2023-07-05 MED ORDER — ALBUTEROL SULFATE (2.5 MG/3ML) 0.083% IN NEBU
2.5000 mg | INHALATION_SOLUTION | Freq: Once | RESPIRATORY_TRACT | Status: AC
  Administered 2023-07-05: 2.5 mg via RESPIRATORY_TRACT

## 2023-07-05 MED ORDER — TORSEMIDE 20 MG PO TABS
40.0000 mg | ORAL_TABLET | Freq: Every day | ORAL | 0 refills | Status: DC
  Filled 2023-07-05: qty 180, 90d supply, fill #0

## 2023-07-05 NOTE — Progress Notes (Signed)
 Discharge instructions (including medications) discussed with and copy provided to patient/caregiver, and they verbalized understanding. PIV x 1 removed, daughter assisted patient with dressing. TOC medications picked up at discharge.

## 2023-07-05 NOTE — Progress Notes (Signed)
 Advanced Heart Failure Rounding Note  Cardiologist: [name], MD  HF Cardiologist: Dr. Rolan Chief Complaint: A/C HFrEF Subjective:    Feels great this morning, sitting on EOB.   Objective:    Vital Signs:   Temp:  [97.4 F (36.3 C)-98.2 F (36.8 C)] 98 F (36.7 C) (06/25 0749) Pulse Rate:  [63-66] 64 (06/25 0749) Resp:  [15-20] 19 (06/25 0749) BP: (85-99)/(56-67) 99/67 (06/25 0749) SpO2:  [100 %] 100 % (06/25 0749) Weight:  [64.3 kg] 64.3 kg (06/25 0451) Last BM Date : 07/02/23  Weight change: Filed Weights   07/04/23 0448 07/05/23 0001 07/05/23 0451  Weight: 64.2 kg 64.3 kg 64.3 kg   Intake/Output:  Intake/Output Summary (Last 24 hours) at 07/05/2023 0824 Last data filed at 07/05/2023 0003 Gross per 24 hour  Intake 480 ml  Output 1400 ml  Net -920 ml   Physical Exam   General:  elderly appearing.  No respiratory difficulty Neck: supple. JVD flat.  Cor: PMI nondisplaced. Regular rate & rhythm. No rubs, gallops or murmurs. Lungs: clear Extremities: no edema  Neuro: alert & oriented x 3. Moves all 4 extremities w/o difficulty. Affect pleasant.   Telemetry   NSR 60s (personally reviewed)  EKG    No new EKG to review  Labs    CBC Recent Labs    07/04/23 0255 07/05/23 0248  WBC 4.7 5.7  HGB 14.1 14.4  HCT 44.0 44.8  MCV 89.2 89.1  PLT 260 245   Basic Metabolic Panel Recent Labs    93/75/74 0255 07/05/23 0248  NA 141 137  K 4.3 4.3  CL 102 101  CO2 28 22  GLUCOSE 131* 123*  BUN 25* 30*  CREATININE 1.32* 1.39*  CALCIUM  9.3 9.2  MG  --  2.3   Liver Function Tests Recent Labs    07/03/23 0308 07/04/23 0255  AST 35 27  ALT 29 28  ALKPHOS 76 68  BILITOT 0.8 0.5  PROT 6.5 6.6  ALBUMIN  3.4* 3.6   BNP (last 3 results) Recent Labs    06/13/23 0936 06/30/23 0054 07/01/23 1153  BNP 1,435.9* 1,125.8* 1,061.0*   Fasting Lipid Panel Recent Labs    07/04/23 0255  CHOL 115  HDL 46  LDLCALC 58  TRIG 55  CHOLHDL 2.5    Medications:    Scheduled Medications:  amiodarone   200 mg Oral Daily   apixaban   5 mg Oral BID   clopidogrel   75 mg Oral Daily   digoxin   0.125 mg Oral Daily   empagliflozin   10 mg Oral QAC breakfast   ezetimibe   10 mg Oral Daily   feeding supplement (GLUCERNA SHAKE)  237 mL Oral TID BM   levothyroxine   75 mcg Oral QAC breakfast   linaclotide   145 mcg Oral QAC breakfast   midodrine   5 mg Oral TID WC   pantoprazole   40 mg Oral BID AC   potassium chloride  SA  40 mEq Oral Daily   rosuvastatin   40 mg Oral Daily   sodium chloride  flush  3 mL Intravenous Q12H   torsemide   40 mg Oral Daily    Infusions:  PRN Medications: melatonin  Patient Profile   89 y.op. male with history of chronic systolic CHF/ICM, CAD, DM II, HTN, PAF. Presenting with acute on chronic CHF.   Assessment/Plan   1. Acute on chronic systolic CHF/ischemic cardiomyopathy: Echo 12/24 with EF 20-25%, no LV thrombus, AK mid septum to apex, moderately reduced RV systolic function.  Echo 2/25 with EF 25%, mildly reduced RV function. Low BP has limited GDMT titration.  His profound fatigue has been concerning for low output HF. RHC 3/25 showed low CI by thermo (2.05) but preserved by Fick. Borderline elevated PCWP, normal RA pressure. There was also moderate mixed pulmonary arterial/pulmonary venous hypertension. With his significant symptoms, he was started on milrinone  0.25 and added sildenafil  to try to lower PA pressure.  Milrinone  was later weaned off with medication adjustment, and co-ox remained excellent. Repeat echo 3/25 showed EF 25-30%, mild RV dysfunction, moderate MR, PASP 62 mmHg. Dr. Rolan discussed LVAD during last admission. s/p Biotronik ICD 5/25. - Presenting with worsening dyspnea and fatigue, NYHA IIIb - RHC 06/30/23 RA 5, PA 62/21 (35), Fick CO/CI 4/2.1, TD CO/CI 3.9/2.1, PVR 3 WU, PAPi 8.2. Mod- sev reduced CO, mild-mod mixed PH.  - Echo yesterday with EF 20-25% with restrictive G3DD, mod reduced RV,  mod MR. - Appears euvolemic. Weight stable. Continue Torsemide  40 mg daily.  - Off all GDMT except jardiance  and digoxin  d/t hypotension. On midodrine .  - Unsure of route he wants to take at this time. Meds vs LVAD vs heart transplant. All are options. LVAD coordinators have seen him and started work up   2. CAD:  Hx inferior STEMI 10/23 s/p PCI/DES to RCA. Anterior STEMI 12/10/22, cath showed occluded pLAD treated with PCI/DES, prior RCA stent patent, severe disease OM1 and distal LCx treated medically. No chest pain.  - Continue Plavix .   - No ASA given Eliquis  use.  - Continue rosuvastatin  40 mg + Zetia  10 mg daily.   3. Atrial fibrillation: Paroxysmal. NSR on tele. - Continue Eliquis  5 mg bid. No bleeding issues. - Continue amiodarone  200 mg daily, amiodarone  labs are UTD. Levothyroxine  recently increased to 75 mg daily d/t elevated TSH. He will need a regular eye exam.    4. DM II: Per PCP. Most recent A1C 7.0 - No change.   5. 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 Not a smoker, no known lung disease. CXR has shown some chronic interstitial changes. HRCT chest with bibasilar bronchiectasis that may be due to aspiration (though swallow study negative).  V/Q scan showed no chronic PE. Rheumatologic serologies negative - Hold tadalafil  with hypotension.   6. Pleural effusion: S/p right thoracentesis 3/25. Pleural fluid was transudative by Light's criteria, no malignant cells. However, it grew E coli.  - Recent CXR (06/02/23) shows small to moderate right pleural effusion. Improved on CXR this admission.    7. GI: Has had significant weight loss, early satiety, GI discomfort.  CT abdomen/pelvis with gastric wall thickening. GI has seen and EGD was performed, no definite evidence for malignancy, biopsies taken for H pylori testing and pathology => negative for H pylori and no gastric cancer.  - Heart failure may play a significant role in the  symptoms.  - Continue PPI bid - Continue outpatient GI follow up  Stable for discharge today post PFTs. Will need labs at follow up. He has close follow up in LVAD clinic with Dr. Rolan.   AHF meds at discharge:  Amiodarone  200 mg daily Eliquis  5 mg BID (will need stop date determined if patient decides to proceed with LVAD at f/u) Plavix  75 mg daily Digoxin  0.125 mg daily Jardiance  10 mg daily Zetia  10 mg daily Midodrine  5 mg TID Rosuvastatin  40 mg daily KDUR 40 mEq daily Torsemide  40 mg daily   Length of Stay: 4  Beckey LITTIE Coe,  NP  07/05/2023, 8:24 AM  Advanced Heart Failure Team Pager [phone] (M-F; 7a - 5p)  Please contact [facility] for night-coverage after hours (5p -7a ) and weekends on [email or web address]

## 2023-07-05 NOTE — Progress Notes (Signed)
 Palliative:  ***  Yong Channel, NP Palliative Medicine Team Pager [phone] (Please see [email or web address] for schedule) Team Phone [phone]

## 2023-07-05 NOTE — Care Management Important Message (Signed)
 Important Message  Patient Details  Name: Garrett Roberts MRN: [id number] Date of Birth: [birth year]   Important Message Given:  Yes - Medicare IM     Vonzell Arrie Sharps 07/05/2023, 9:58 AM

## 2023-07-05 NOTE — Progress Notes (Signed)
 LVAD Initial Psychosocial Screening  Date/Time Initiated:  07/05/23 at 10:22am  Referral Source:  VAD Coordinators  Referral Reason:  VAD Candidate  Source of Information:  Patient and daughter (Garrett Roberts)  Demographics Name:  Garrett Roberts Address:  [address] [facility] [place] [address]  Home/Cell: [phone]  Marital Status: Married  Faith:  Muslim  Primary Language:  Arabic  Last 4 # SS: [id number]  DOB: Nov 03, 1955   Psychological Health Appearance:  In hospital gown Mental Status:  Alert, oriented Eye Contact:  Good Thought Content:  Coherent Speech:  Soft Mood:  Appropriate, Good Spirit, and Pleasant  Affect:  Appropriate to circumstance and Positive Insight:  Good Judgement: Unimpaired Interaction Style:  Engaged and Positive   Family/Social Information Who lives in your home? Name: Garrett Roberts   Relationship: Daughter  Garrett Roberts    Daughter  Other family members/support persons in your life? Name: Garrett Roberts  Relationship: Brother  Garrett Roberts     Sister   Community Are you active with community agencies/resources/homecare? No Agency Name: N/A Are you active in a church, synagogue, mosque or other faith based community? Yes Faith based institutions name: [facility]  Do you have other sources do you have for spiritual support? No  Are you active in any clubs or social organizations? No What do you do for fun?  Hobbies?  Interests? Watching the News and working on cars.    Home Environment/Personal Care Do you have reliable phone service? Yes  If so, what is the number?  [phone]  Do you own or rent your home? Own Current mortgage/rent:  Between $41 - $1200/ month Number of steps into the home? 6 How many levels in the home? 2 Electrical needs for LVAD (3 prong outlets)? No, has Second hand smoke exposure in the home? No Travel distance from [facility]? 12 - 15 minutes (depending on traffic)   Financial Information What is your source of income? Use to work  Do  you have difficulty meeting your monthly expenses? No If yes, which ones? N/A How do you cope with this? Patient reported sometimes he does worry but for the most part family helps assist with meeting monthly expenses and that makes him feel better knowing he has family  Can you budget for the monthly cost for dressing supplies post procedure? Yes   Primary Health insurance:  Medicare Part A and B Secondary Insurance: [place] Medicaid/[employer] (managed plan) Prescription plan: -  Have you ever had to refuse medication due to cost?  Yes Do you use mail order for your prescriptions?  No Have you applied for Medicaid?  No, has medicaid  Have you applied for Social Security Disability/(SSI) ? Patient inquired about applying for disability. CSW will provide resources for disability application.    Education/Work Information What is the last grade of school you completed? Completed high school  Do you have any problems with reading or writing?  No Preferred method of learning?  Written and Verbal Are you currently employed?  No   If not currently employed when did you last work? 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 For years Did you serve in the [employer]? No  If so, what branch? N/A   Advance Directives: Do you have a Living Will or Medical POA? No  Would you like to complete a Living Will and Medical POA prior to surgery?  Pending Have you had a consult with the Palliative Care Team at  [facility]? Yes   Legal Do you currently have any legal issues/problems?  No Have you had any legal issues/problems in the past?  No Do you have a Durable POA?  N/A Name of Durable POA? N/A Contact number:  N/A  Medical Information Do you have any family history of heart problems? No Do you have a PCP or other medical provider? Yes, Dr. Vincente Are you able to complete your ADL's?  Yes Do you have any assistive devices in the home? No  How are you  currently managing your medications at home? Family assists patient with taking medication. Patient stated they have used pill boxes but that did not work out. His medication are separated in their bottles by the time of day he needs to take them.  How many hours do you sleep at night? 5-6 hours How is your appetite? Poor Do you smoke now or past usage? never    Quit date: N/A Do you drink alcohol now or past usage? never    Quit date:  N/A Are you currently using illegal drugs or misuse of medication or past usage? never  Have you ever been treated for substance abuse? No      If yes, where and when did you receive treatment? N/A   Mental Health History How have you been feeling in the past year? Patient reported feeling alone, worried, and does not like being away from family   PHQ2 Depression Scale: 2 PHQ9 Depression scale (if positive PHQ2 screen): 10  Have you ever had any problems with depression, anxiety or other mental health concerns? No Have you or are you taking medications for anxiety/depression or any mental health concerns?  No  Current Medications: N/A Do you have a history of a traumatic event? No, Daughter prompted patient to think about his life and any battles he grew up around. Patient reported he does not recognize anything in his life as trauma.  Have you had any past or current thoughts of suicide? No Are there any other current stressors in your life?  No Do you see a counselor, psychiatrist or therapist?  No If you are currently experiencing problems are you interested in talking with a professional? No What are your coping strategies under stressful situations? Be by myself  Would you be interested in attending the LVAD support group? Maybe   Medical & Follow-up Do you take your medications as prescribed by the doctors? yes Do you feel as if you have a good understanding of your medications and what they are for? Most of them I understand, I google the  others if I don't If you experience medical concerns or barriers to care in between appointments how do you manage this? Patient stated he will call the nurse or doctor No Show Rate Percentage: 4% Comments:     Caregiving Needs Who is the primary caregiver? Garrett Roberts Health status:  Good Do you drive?  Yes Do you work?  Yes Physical Limitations:  Currently has boot on foot but is mobile  Do you have other care giving responsibilities?  No Contact number: [phone]  Who is the secondary caregiver? Garrett Roberts Health status:  good Do you drive?  Yes Do you work?  Yes  Physical Limitations:  No Do you have other care giving responsibilities?  No Contact number: [phone]   Plan for VAD Implementation Do you know and understand what happens during the VAD surgery? Patient Verbalizes Understanding  of surgery and able  to describe details What do you know about the risks and side effect associated with VAD surgery? Patient Verbalizes Understanding  of risks (infection, stroke and death) Explain what will happen right after surgery: Patient Verbalizes Understanding  of OR to ICU and will be intubated  What is your plan for transportation for the first 8 weeks post-surgery? (Patients are not recommended to drive post-surgery for 8 weeks)  Driver: Garrett Roberts or Reem Do you have airbags in your vehicle?  There is a risk of discharging the device if the airbag were to deploy. Yes What do you know about your diet post-surgery? Patient Verbalizes Understanding  of Heart healthy How do you plan to complete ADL's post-surgery?  With the help of family  Will it be difficult to ask for help from your caregivers?  No  Please explain what you hope will be improved about your life as a result of receiving the LVAD?  I can move around, travel, and do things that I want to do, go see my family maybe. Please tell me your biggest concern or fear about living with the LVAD?  That I can't follow the instructions  for after surgery  Please explain your understanding of how your body will change?  I think its going to improve  Are you worried about these changes? No Do you see any barriers to your surgery or follow-up? No  Understanding of LVAD Patient states understanding of the following: Surgical procedures and risks, Electrical need for LVAD (3 prong outlets), Safety precautions with LVAD (water, etc.), LVAD daily self-care (dressing changes, computer check, extra supplies), Outpatient follow up (LVAD clinic appts, monitoring blood thinners), and Need for Emergency Planning  Discussed and Reviewed with Patient and Caregiver  Patient's current level of motivation to prepare for LVAD: Patient seems motivated but hesitant as he wants to visit his family first  Patient's present Level of Consent for LVAD:  Undecided     Education provided to patient/family/caregiver:   Caregiver role and responsibiltiy, Biomedical engineer for LVAD, Role of Clinical Child psychotherapist, and Signs of Depression and Anxiety    Discussed and Reviewed with Patient and Caregiver  Caregiver questions Please explain what you hope will be improved about your life and loved one's life as a result of receiving the LVAD?  More time with mom, and if he can travel with it that'll be fantastic  What is your biggest concern or fear about caregiving with an LVAD patient?  Scared about technology failing us , not that I do not trust whoever created the VAD, but if he is able to travel with it my fear is that something may go wrong the second he travels with it.  What is your plan for availability to provide care 24/7 x2 weeks post op and dressing changes ongoing?  Family will create shifts and coordinate a schedule to conduct shift changes to provide care Who is the relief/backup caregiver and what is their availability?  Garrett Roberts (and patients wife) Preferred method of learning? Written and Hands on  Do you drive? yes How do you handle  stressful situations?  Take deep breaths  Do you think you can do this? Yes Is there anything that concerns about caregiving?  No Do you provide caregiving to anyone else?  No  Caregiver's current level of motivation to prepare for LVAD: Undecided  Caregiver's present level of consent for LVAD: Agreeable but has concerns regarding traveling  Clinical Interventions Needed:   Disability information to start the application  process. CSW emailed Garrett Roberts at [email or web address] with the information.   Clinical Impressions/Recommendations:  Nature is a 68 y.o, male potential Left Ventricular Assist Device (LVAD) candidate. He demonstrates good cognitive function, appropriate mood and affect, and adequate insight and judgment. Psychosocial evaluation reveals mild to moderate depressive symptoms (PHQ-9 score 10) with feelings of loneliness and worry, although no history of mental illness or suicidal ideation is present. The patient has strong family support, with two primary caregivers who are engaged and motivated but have some concerns regarding LVAD technology and post-operative travel safety. They intend on being the patients caregivers and implementing a shift changes process in order to provide patient adequate care.   The patient owns a two-level home with adequate resources, reliable phone service, and a manageable commute to the hospital. Financially, he is supported by family and has Medicare and Medicaid coverage, though concerns about medication costs have been noted previously. He is independent in activities of daily living with family assistance for medication management.   The patient demonstrates understanding of the LVAD surgical procedure, associated risks, post-operative care, and safety precautions. However, he expresses hesitancy and concerns about his ability to adhere to post-operative instructions. Caregiver education and support plans are in place, including scheduling for 24/7  care in the immediate post-op period. Advance directives were discussed and patient is unsure if he wants to complete HCPOA paperwork at this time but stated that his daughter Garrett Roberts will be the top decision maker.  Overall, the patient appears motivated for LVAD implantation with a supportive social network, but would benefit from continued education and caregiver training to optimize post-operative outcomes. CSW notified VAD coordinator with completion of this screening and any concerns patient had.   Tillmon Kisling, MSW, LCSWA Transitions of Care [phone]

## 2023-07-05 NOTE — TOC Transition Note (Signed)
 Transition of Care [facility]) - Discharge Note   Patient Details  Name: Garrett Roberts MRN: [id number] Date of Birth: 08/09/1955  Transition [facility]) CM/SW Contact:  Waddell Barnie Rama, RN Phone Number: 07/05/2023, 10:05 AM   Clinical Narrative:    For possible dc today, has transport home.   Final next level of care: Home/Self Care Barriers to Discharge: No Barriers Identified   Patient Goals and CMS Choice Patient states their goals for this hospitalization and ongoing recovery are:: wants to remain independent          Discharge Placement                       Discharge Plan and Services Additional resources added to the After Visit Summary for     Discharge Planning Services: CM Consult                                 Social Drivers of Health (SDOH) Interventions SDOH Screenings   Food Insecurity: No Food Insecurity (06/30/2023)  Housing: Low Risk  (06/30/2023)  Transportation Needs: No Transportation Needs (06/30/2023)  Utilities: Not At Risk (06/30/2023)  Alcohol Screen: Low Risk  (12/13/2022)  Depression (PHQ2-9): Low Risk  (06/12/2023)  Financial Resource Strain: High Risk (12/13/2022)  Physical Activity: Sufficiently Active (11/27/2019)   Received from [facility]  Social Connections: Socially Integrated (06/30/2023)  Stress: Stress Concern Present (11/27/2019)   Received from [facility]  Tobacco Use: Low Risk  (06/30/2023)     Readmission Risk Interventions     No data to display

## 2023-07-05 NOTE — Discharge Summary (Signed)
 Physician Discharge Summary  Garrett Roberts FMW:[id number] DOB: 17-Apr-1955  PCP: Vincente Shivers, NP  Admitted from: Home Discharged to: Home  Admit date: 06/30/2023 Discharge date: 07/05/2023  Recommendations for Outpatient Follow-up:    Follow-up Information     Vincente Shivers, NP Follow up on 07/06/2023.   Specialty: General Practice Why: hospital follow up appointment @11 :40am.  To be seen with repeat labs (CBC & CMP). Contact information: [address] [place] [address] [phone]         Lucas Dorise POUR, MD. Schedule an appointment as soon as possible for a visit.   Specialty: Cardiothoracic Surgery Contact information: [address] [place] [address] [phone]         [facility] Follow up on 07/19/2023.   Specialty: Cardiology Why: Follow up in the LVAD clinic 07/19/23 at 1 pm Contact information: [address] [place]  [phone]        [facility] Follow up on 07/25/2023.   Specialty: Cardiology Why: Follow up with Dr. Rolan 07/25/23 at 9:40 am Contact information: [address] [place]  [phone]                 Home Health: None    Equipment/Devices: None    Discharge Condition: Improved and stable.   Code Status: Full Code Diet recommendation:  Discharge Diet Orders (From admission, onward)     Start     Ordered   07/03/23 0000  Diet - low sodium heart healthy        07/03/23 0957             Discharge Diagnoses:  Principal Problem:   HFrEF (heart failure with reduced ejection fraction) (HCC) Active Problems:   Recurrent right pleural effusion   Chronic hypotension   CAD (coronary artery disease)   Atrial fibrillation (HCC)   Renal insufficiency   Type 2 diabetes mellitus with complication, without long-term current use of insulin  (HCC)    Hypothyroidism   GERD (gastroesophageal reflux disease)   Brief Summary: 67 year old male with medical history significant for ischemic cardiomyopathy, chronic systolic CHF, CAD, inferior STEMI s/p PCI/DES to RCA 10/23, DM2, HTN, HLD, PAF, presented with complaints of shortness of breath, chest pain, feet swelling and orthopnea x 3 days, and was admitted for acute on chronic systolic CHF.  Cardiology consulted and assisted with management.  Assessment and plan:  Acute on chronic systolic CHF/ischemic cardiomyopathy Cardiology was consulted and assisted with evaluation and management. 2D echo 07/03/2023 showed LVEF 20-25%, global LV hypokinesis and grade 3 diastolic dysfunction. She was treated briefly with IV Lasix  on 6/20 and 6/21.  Following that he has been transition to oral diuretics.  Clinically he appears euvolemic. Advanced heart failure cardiology have seen him today and cleared him for discharge with meds as noted below. GDMT limited due to hypotension Cardiology consulted thoracic surgery whose input pertaining to LVAD appreciated.  Patient will follow-up outpatient with both cardiology and cardiothoracic surgery regarding pursuing LVAD.  He has completed substantial workup in-house for LVAD placement. Has Biotronik ICD placed 5/25. Underwent right heart cath 6/20 which showed Moderate to severely reduced cardiac output, volume overload, moderate-mild mixed pulmonary hypertension  AHF meds at discharge:  Amiodarone  200 mg daily Eliquis  5 mg BID (will need stop date determined if patient decides to proceed with LVAD at f/u) Plavix  75 mg daily Digoxin  0.125 mg daily  Jardiance  10 mg daily Zetia  10 mg daily Midodrine  5 mg TID Rosuvastatin  40 mg daily KDUR 40 mEq daily Torsemide  40 mg daily   CAD/history of prior STEMI's with intervention and 10/23 and 11/24 No anginal symptoms Continue Plavix , rosuvastatin  and Zetia .  No aspirin  due to Eliquis  use.  Paroxysmal atrial  fibrillation In sinus rhythm on telemetry Continue amiodarone , digoxin  and Eliquis   Type II DM A1c 7 on 06/12/2023 Continue Jardiance   Pulmonary hypertension Taladafil discontinued due to hypotension Outpatient cardiology follow-up.  Hypotension Now on midodrine  5 Mg 3 times daily.  Hypothyroidism TSH 22.402 on 6/3 which is improved to 14.882 on 6/25 after levothyroxine  dose was increased.  Free T4:1.03.  Recommend repeating TFTs in 4 to 6 weeks.  GERD Continue PPI.  Stage II CKD At baseline, his GFR is >60.  However over the last 2 days, creatinine has increased and stable in the 1.3 range, probably related to diuretics.  Close outpatient follow-up.  Consultations: Cardiology/advanced heart failure team Cardiothoracic surgery  Procedures: As noted above   Discharge Instructions  Discharge Instructions     (HEART FAILURE PATIENTS) Call MD:  Anytime you have any of the following symptoms: 1) 3 pound weight gain in 24 hours or 5 pounds in 1 week 2) shortness of breath, with or without a dry hacking cough 3) swelling in the hands, feet or stomach 4) if you have to sleep on extra pillows at night in order to breathe.   Complete by: As directed    AMB Referral to Phase II Cardiac Rehab   Complete by: As directed    Diagnosis: Heart Failure (see criteria below if ordering Phase II)   Heart Failure Type: Chronic Systolic & Diastolic   After initial evaluation and assessments completed: Virtual Based Care may be provided alone or in conjunction with Phase 2 Cardiac Rehab based on patient barriers.: Yes   Intensive Cardiac Rehabilitation (ICR) [facility] location only OR Traditional Cardiac Rehabilitation (TCR) *If criteria for ICR are not met will enroll in TCR ([facility] only): Yes   Activity as tolerated - No restrictions   Complete by: As directed    Call MD for:  difficulty breathing, headache or visual disturbances   Complete by: As directed    Call MD for:  persistant dizziness or  light-headedness   Complete by: As directed    Call MD for:  temperature >100.4   Complete by: As directed    Diet - low sodium heart healthy   Complete by: As directed    Discharge instructions   Complete by: As directed    Follow-up with your cardiologist within 1 week Follow-up with the PCP over the 1-2 weeks Please make a note of change of all your medication including heart medicines Continue monitor daily weight, blood pressure.   Increase activity slowly   Complete by: As directed         Medication List     STOP taking these medications    MAGNESIUM PO   mirtazapine  15 MG tablet Commonly known as: Remeron    ondansetron  4 MG disintegrating tablet Commonly known as: ZOFRAN -ODT   spironolactone  25 MG tablet Commonly known as: ALDACTONE    tadalafil  20 MG tablet Commonly known as: CIALIS    VITAMIN C PO   zinc gluconate 50 MG tablet       TAKE these medications    acetaminophen  325 MG tablet Commonly known as: TYLENOL  Take 1-2 tablets (325-650 mg total) by mouth every 4 (four) hours as  needed for mild pain (pain score 1-3). What changed:  how much to take when to take this reasons to take this   amiodarone  200 MG tablet Commonly known as: PACERONE  Take 1 tablet (200 mg total) by mouth daily.   apixaban  5 MG Tabs tablet Commonly known as: ELIQUIS  Take 1 tablet (5 mg total) by mouth 2 (two) times daily.   clopidogrel  75 MG tablet Commonly known as: PLAVIX  Take 1 tablet (75 mg total) by mouth daily.   digoxin  0.125 MG tablet Commonly known as: LANOXIN  Take 1 tablet (0.125 mg total) by mouth daily.   empagliflozin  10 MG Tabs tablet Commonly known as: Jardiance  Take 1 tablet (10 mg total) by mouth daily before breakfast.   ezetimibe  10 MG tablet Commonly known as: ZETIA  Take 1 tablet (10 mg total) by mouth daily.   levothyroxine  75 MCG tablet Commonly known as: SYNTHROID  Take 1 tablet (75 mcg total) by mouth daily.   linaclotide  145 MCG  Caps capsule Commonly known as: Linzess  Take 1 capsule (145 mcg total) by mouth daily before breakfast.   melatonin 5 MG Tabs Take 5 mg by mouth at bedtime as needed (sleep).   midodrine  5 MG tablet Commonly known as: PROAMATINE  Take 1 tablet (5 mg total) by mouth 3 (three) times daily with meals. What changed:  medication strength how much to take   pantoprazole  40 MG tablet Commonly known as: PROTONIX  Take 1 tablet (40 mg total) by mouth 2 (two) times daily before a meal.   potassium chloride  SA 20 MEQ tablet Commonly known as: KLOR-CON  M Take 2 tablets (40 mEq total) by mouth daily.   rosuvastatin  40 MG tablet Commonly known as: CRESTOR  Take 40 mg by mouth daily.   torsemide  20 MG tablet Commonly known as: DEMADEX  Take 2 tablets (40 mg total) by mouth daily. What changed: when to take this   VITAMIN K2-VITAMIN D3 PO Take 1 tablet by mouth daily with lunch.       Allergies  Allergen Reactions   Beef-Derived Drug Products    Pork-Derived Products Other (See Comments)    Religious observance      Procedures/Studies: VAS US  LOWER EXTREMITY VENOUS (DVT) Result Date: 07/04/2023  Lower Venous DVT Study Patient Name:  Garrett Roberts  Date of Exam:   07/04/2023 Medical Rec #: [id number]             Accession #:    [id number] Date of Birth: 27-Feb-1955            Patient Gender: M Patient Age:   50 years Exam Location:  [facility] Procedure:      VAS US  LOWER EXTREMITY VENOUS (DVT) Referring Phys: BENJAMIN STONER --------------------------------------------------------------------------------  Indications: Prevad workup.  Comparison Study: No priors. Performing Technologist: Ricka Sturdivant-Jones RDMS, RVT  Examination Guidelines: A complete evaluation includes B-mode imaging, spectral Doppler, color Doppler, and power Doppler as needed of all accessible portions of each vessel. Bilateral testing is considered an integral part of a complete examination. Limited  examinations for reoccurring indications may be performed as noted. The reflux portion of the exam is performed with the patient in reverse Trendelenburg.  +---------+---------------+---------+-----------+----------+--------------+ RIGHT    CompressibilityPhasicitySpontaneityPropertiesThrombus Aging +---------+---------------+---------+-----------+----------+--------------+ CFV      Full           Yes      Yes                                 +---------+---------------+---------+-----------+----------+--------------+  SFJ      Full                                                        +---------+---------------+---------+-----------+----------+--------------+ FV Prox  Full                                                        +---------+---------------+---------+-----------+----------+--------------+ FV Mid   Full                                                        +---------+---------------+---------+-----------+----------+--------------+ FV DistalFull                                                        +---------+---------------+---------+-----------+----------+--------------+ PFV      Full                                                        +---------+---------------+---------+-----------+----------+--------------+ POP      Full           Yes      Yes                                 +---------+---------------+---------+-----------+----------+--------------+ PTV      Full                                                        +---------+---------------+---------+-----------+----------+--------------+ PERO     Full                                                        +---------+---------------+---------+-----------+----------+--------------+   +---------+---------------+---------+-----------+----------+--------------+ LEFT     CompressibilityPhasicitySpontaneityPropertiesThrombus Aging  +---------+---------------+---------+-----------+----------+--------------+ CFV      Full           Yes      Yes                                 +---------+---------------+---------+-----------+----------+--------------+ SFJ      Full                                                        +---------+---------------+---------+-----------+----------+--------------+  FV Prox  Full                                                        +---------+---------------+---------+-----------+----------+--------------+ FV Mid   Full                                                        +---------+---------------+---------+-----------+----------+--------------+ FV DistalFull                                                        +---------+---------------+---------+-----------+----------+--------------+ PFV      Full                                                        +---------+---------------+---------+-----------+----------+--------------+ POP      Full           Yes      Yes                                 +---------+---------------+---------+-----------+----------+--------------+ PTV      Full                                                        +---------+---------------+---------+-----------+----------+--------------+ PERO     Full                                                        +---------+---------------+---------+-----------+----------+--------------+     Summary: BILATERAL: - No evidence of deep vein thrombosis seen in the lower extremities, bilaterally. -No evidence of popliteal cyst, bilaterally.   *See table(s) above for measurements and observations. Electronically signed by Penne Colorado MD on 07/04/2023 at 5:02:47 PM.    Final    VAS US  DOPPLER PRE VAD Result Date: 07/04/2023 PERIOPERATIVE VASCULAR EVALUATION Patient Name:  Garrett Roberts  Date of Exam:   07/04/2023 Medical Rec #: [id number]             Accession #:    [id number] Date  of Birth: February 23, 1955            Patient Gender: M Patient Age:   26 years Exam Location:  [facility] Procedure:      VAS US  DOPPLER PRE VAD Referring Phys: BENJAMIN STONER --------------------------------------------------------------------------------  Risk Factors:  Hypertension, hyperlipidemia, Diabetes, coronary artery disease. Other Factors: CKD Stage II. Performing Technologist: Delon Fetch  Examination Guidelines: A complete evaluation includes B-mode imaging, spectral Doppler, color Doppler,  and power Doppler as needed of all accessible portions of each vessel. Bilateral testing is considered an integral part of a complete examination. Limited examinations for reoccurring indications may be performed as noted.  Right Carotid Findings: +----------+--------+--------+--------+--------+------------------+           PSV cm/sEDV cm/sStenosisDescribeComments           +----------+--------+--------+--------+--------+------------------+ CCA Prox  40      14                                         +----------+--------+--------+--------+--------+------------------+ CCA Distal37      11                      intimal thickening +----------+--------+--------+--------+--------+------------------+ ICA Prox  45      17      1-39%           intimal thickening +----------+--------+--------+--------+--------+------------------+ ICA Distal55      25                                         +----------+--------+--------+--------+--------+------------------+ ECA       81      13                                         +----------+--------+--------+--------+--------+------------------+ +----------+--------+-------+----------------+------------+           PSV cm/sEDV cmsDescribe        Arm Pressure +----------+--------+-------+----------------+------------+ [id number]             Multiphasic, WNL99           +----------+--------+-------+----------------+------------+  +---------+--------+--+--------+--+---------+ VertebralPSV cm/s70EDV cm/s18Antegrade +---------+--------+--+--------+--+---------+ Left Carotid Findings: +----------+--------+--------+--------+--------+------------------+           PSV cm/sEDV cm/sStenosisDescribeComments           +----------+--------+--------+--------+--------+------------------+ CCA Prox  41      10                                         +----------+--------+--------+--------+--------+------------------+ CCA Distal42      14                      intimal thickening +----------+--------+--------+--------+--------+------------------+ ICA Prox  34      13      1-39%           intimal thickening +----------+--------+--------+--------+--------+------------------+ ICA Distal51      17                                         +----------+--------+--------+--------+--------+------------------+ ECA       46      8                                          +----------+--------+--------+--------+--------+------------------+ +----------+--------+--------+----------------+------------+ SubclavianPSV cm/sEDV cm/sDescribe        Arm Pressure +----------+--------+--------+----------------+------------+  54              Multiphasic, WNL98           +----------+--------+--------+----------------+------------+ +---------+--------+--+--------+--+---------+ VertebralPSV cm/s34EDV cm/s17Antegrade +---------+--------+--+--------+--+---------+  ABI Findings: +---------+------------------+-----+----------+--------+ Right    Rt Pressure (mmHg)IndexWaveform  Comment  +---------+------------------+-----+----------+--------+ Brachial 99                     triphasic          +---------+------------------+-----+----------+--------+ ATA      82                0.83 monophasic         +---------+------------------+-----+----------+--------+ PTA      109               1.10 triphasic           +---------+------------------+-----+----------+--------+ Great Toe51                     Abnormal           +---------+------------------+-----+----------+--------+ +---------+------------------+-----+---------+-------+ Left     Lt Pressure (mmHg)IndexWaveform Comment +---------+------------------+-----+---------+-------+ Brachial 98                     triphasic        +---------+------------------+-----+---------+-------+ ATA      112               1.13 triphasic        +---------+------------------+-----+---------+-------+ PTA      123               1.24 triphasic        +---------+------------------+-----+---------+-------+ Burnetta Gibbons                     Abnormal         +---------+------------------+-----+---------+-------+ +-------+---------------+----------------+ ABI/TBIToday's ABI/TBIPrevious ABI/TBI +-------+---------------+----------------+ Right  1.10 / 0.52                     +-------+---------------+----------------+ Left   1.13 / 0.68                     +-------+---------------+----------------+  Summary: Right Carotid: Velocities in the right ICA are consistent with a 1-39% stenosis. Left Carotid: Velocities in the left ICA are consistent with a 1-39% stenosis. Vertebrals:  Bilateral vertebral arteries demonstrate antegrade flow. Subclavians: Normal flow hemodynamics were seen in bilateral subclavian              arteries.  *See table(s) above for measurements and observations. Right ABI: Resting right ankle-brachial index is within normal range. The right toe-brachial index is abnormal. Left ABI: Resting left ankle-brachial index is within normal range. The left toe-brachial index is abnormal.  Electronically signed by Penne Colorado MD on 07/04/2023 at 5:02:29 PM.    Final    DG Orthopantogram Result Date: 07/03/2023 CLINICAL DATA:  [id number] Preoperative evaluation to rule out surgical contraindication 414666 EXAM: ORTHOPANTOGRAM/PANORAMIC  COMPARISON:  None Available. FINDINGS: Numerous missing mandibular and maxillary teeth. No visible periapical abscess. No acute bony abnormality. Visualized paranasal sinuses clear. IMPRESSION: No acute findings. Electronically Signed   By: Franky Crease M.D.   On: 07/03/2023 17:00   ECHOCARDIOGRAM COMPLETE Result Date: 07/03/2023    ECHOCARDIOGRAM REPORT   Patient Name:   Garrett Roberts Date of Exam: 07/03/2023 Medical Rec #:  [id number]            Height:  72.0 in Accession #:    [id number]           Weight:       140.4 lb Date of Birth:  01-30-1955           BSA:          1.833 m Patient Age:    67 years             BP:           105/73 mmHg Patient Gender: M                    HR:           66 bpm. Exam Location:  Inpatient Procedure: 2D Echo (Both Spectral and Color Flow Doppler were utilized during            procedure). Indications:    heart failure. LVAD evaluation.  History:        Patient has prior history of Echocardiogram examinations, most                 recent 03/30/2023. CAD, Defibrillator, chronic kidney disease,                 Arrythmias:Atrial Fibrillation, Signs/Symptoms:Edema and Chest                 Pain; Risk Factors:Hypertension, Diabetes and Dyslipidemia.  Sonographer:    Tinnie Barefoot RDCS Referring Phys: [phone] ALMA L DIAZ IMPRESSIONS  1. Left ventricular ejection fraction, by estimation, is 20 to 25%. The left ventricle has severely decreased function. The left ventricle demonstrates global hypokinesis. The left ventricular internal cavity size was mildly dilated. Left ventricular diastolic parameters are consistent with Grade III diastolic dysfunction (restrictive).  2. Right ventricular systolic function is moderately reduced. The right ventricular size is normal. There is moderately elevated pulmonary artery systolic pressure. The estimated right ventricular systolic pressure is 44.0 mmHg.  3. Left atrial size was severely dilated.  4. The mitral valve is  degenerative. Mild to moderate mitral valve regurgitation. No evidence of mitral stenosis.  5. Tricuspid valve regurgitation is mild to moderate.  6. The aortic valve is tricuspid. Aortic valve regurgitation is not visualized. No aortic stenosis is present.  7. The inferior vena cava is normal in size with <50% respiratory variability, suggesting right atrial pressure of 8 mmHg. FINDINGS  Left Ventricle: Left ventricular ejection fraction, by estimation, is 20 to 25%. The left ventricle has severely decreased function. The left ventricle demonstrates global hypokinesis. The left ventricular internal cavity size was mildly dilated. There is no left ventricular hypertrophy. Left ventricular diastolic parameters are consistent with Grade III diastolic dysfunction (restrictive). Right Ventricle: The right ventricular size is normal. No increase in right ventricular wall thickness. Right ventricular systolic function is moderately reduced. There is moderately elevated pulmonary artery systolic pressure. The tricuspid regurgitant velocity is 3.00 m/s, and with an assumed right atrial pressure of 8 mmHg, the estimated right ventricular systolic pressure is 44.0 mmHg. Left Atrium: Left atrial size was severely dilated. Right Atrium: Right atrial size was normal in size. Pericardium: There is no evidence of pericardial effusion. Mitral Valve: The mitral valve is degenerative in appearance. Moderately decreased mobility of the mitral valve leaflets. Mild to moderate mitral valve regurgitation. No evidence of mitral valve stenosis. Tricuspid Valve: The tricuspid valve is normal in structure. Tricuspid valve regurgitation is mild to moderate. No evidence of tricuspid stenosis. Aortic Valve: The aortic valve is tricuspid.  Aortic valve regurgitation is not visualized. No aortic stenosis is present. Pulmonic Valve: The pulmonic valve was normal in structure. Pulmonic valve regurgitation is mild. No evidence of pulmonic stenosis.  Aorta: The aortic root is normal in size and structure. Venous: The inferior vena cava is normal in size with less than 50% respiratory variability, suggesting right atrial pressure of 8 mmHg. IAS/Shunts: No atrial level shunt detected by color flow Doppler. Additional Comments: A device lead is visualized.  LEFT VENTRICLE PLAX 2D LVIDd:         5.60 cm      Diastology LVIDs:         4.70 cm      LV e' medial:  4.46 cm/s LV PW:         0.90 cm      LV e' lateral: 10.00 cm/s LV IVS:        1.00 cm LVOT diam:     1.80 cm LV SV:         34 LV SV Index:   18 LVOT Area:     2.54 cm  LV Volumes (MOD) LV vol d, MOD A4C: 149.0 ml LV vol s, MOD A4C: 111.0 ml LV SV MOD A4C:     149.0 ml RIGHT VENTRICLE            IVC RV Basal diam:  3.30 cm    IVC diam: 1.90 cm RV S prime:     9.25 cm/s TAPSE (M-mode): 1.3 cm LEFT ATRIUM              Index        RIGHT ATRIUM           Index LA diam:        4.70 cm  2.56 cm/m   RA Area:     17.90 cm LA Vol (A2C):   112.0 ml 61.09 ml/m  RA Volume:   53.80 ml  29.35 ml/m LA Vol (A4C):   89.7 ml  48.93 ml/m LA Biplane Vol: 100.0 ml 54.55 ml/m  AORTIC VALVE LVOT Vmax:   75.50 cm/s LVOT Vmean:  48.200 cm/s LVOT VTI:    0.132 m  AORTA Ao Root diam: 3.50 cm Ao Asc diam:  3.90 cm MITRAL VALVE              TRICUSPID VALVE MV Area VTI: 0.30 cm     TV Peak grad:   36.2 mmHg MV VTI:      1.13 m       TV Vmax:        3.01 m/s MR Peak grad: 58.1 mmHg   TR Peak grad:   36.0 mmHg MR Vmax:      381.00 cm/s TR Vmax:        300.00 cm/s                            SHUNTS                           Systemic VTI:  0.13 m                           Systemic Diam: 1.80 cm Morene Brownie Electronically signed by Morene Brownie Signature Date/Time: 07/03/2023/4:54:56 PM    Final    CARDIAC CATHETERIZATION Result Date: 07/02/2023 Findings: RA = 5 RV = 65/10  PA = 62/21 (35) PCW = 23 Fick cardiac output/index = 4.0/2.1 Thermo CO/CI = 3.9/2.1 PVR = 3.0 WU Ao sat = 99% PA sat = 68% PAPi = 8.2 Assessment: 1.  Moderate to severely reduced CO 2. Volume overload 3. 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 Plan/Discussion: Continue diuresis. Consider w/u for advanced therapies as outpatient Daniel Bensimhon, MD 3:37 PM  DG Chest 2 View Result Date: 06/30/2023 EXAM: 2 VIEW(S) XRAY OF THE CHEST 06/30/2023 01:04:00 AM COMPARISON: 06/02/2023 CLINICAL HISTORY: shob. Encounter for shortness of breath FINDINGS: LUNGS AND PLEURA: Small right pleural effusion. Associated right lower lobe opacity, likely atelectasis. Mild right perihilar edema. HEART AND MEDIASTINUM: No acute abnormality of the cardiac and mediastinal silhouettes. BONES AND SOFT TISSUES: No acute osseous abnormality. Left subclavian ICD in appropriate position. IMPRESSION: 1. Mild right perihilar edema. 2. Small right pleural effusion. Associated right lower lobe opacity, likely atelectasis. Electronically signed by: Pinkie Pebbles MD 06/30/2023 01:07 AM EDT RP Workstation: [id number]      Subjective: Patient denies any complaints.  Denies chest pain, dyspnea, orthopnea, leg swelling.  Independently ambulating to bathroom without complaints.  He indicates that he lives with his spouse and 3 daughters and is independent of his activities at baseline.  Discharge Exam:  Vitals:   07/05/23 0451 07/05/23 0749 07/05/23 1000 07/05/23 1145  BP:  99/67 96/62 109/69  Pulse:  64  65  Resp:  19 18 19   Temp:  98 F (36.7 C)  97.8 F (36.6 C)  TempSrc:  Oral Oral Oral  SpO2:  100%  100%  Weight: 64.3 kg     Height:        General: Middle-age male, moderately built and thinly nourished sitting up comfortably in bed without distress. Cardiovascular: S1 & S2 heard, RRR, S1/S2 +. No murmurs, rubs, gallops or clicks. No JVD or pedal edema.  Telemetry personally reviewed: Sinus rhythm, occasional PVCs, BBB morphology. Respiratory: Clear to auscultation without wheezing, rhonchi or crackles. No increased work of breathing. Abdominal:  Non distended, non tender & soft. No  organomegaly or masses appreciated. Normal bowel sounds heard. CNS: Alert and oriented. No focal deficits. Extremities: no edema, no cyanosis    The results of significant diagnostics from this hospitalization (including imaging, microbiology, ancillary and laboratory) are listed below for reference.     Microbiology: No results found for this or any previous visit (from the past 240 hours).   Labs: CBC: Recent Labs  Lab 06/30/23 0054 06/30/23 1419 06/30/23 1424 07/02/23 0255 07/03/23 0308 07/04/23 0255 07/05/23 0248  WBC 6.0  --   --  5.6 5.4 4.7 5.7  HGB 14.9   < > 15.6 14.0 13.5 14.1 14.4  HCT 47.3   < > 46.0 44.4 42.4 44.0 44.8  MCV 91.3  --   --  90.4 90.2 89.2 89.1  PLT 274  --   --  240 236 260 245   < > = values in this interval not displayed.    Basic Metabolic Panel: Recent Labs  Lab 06/30/23 0619 06/30/23 1419 07/01/23 0316 07/02/23 0255 07/03/23 0308 07/04/23 0255 07/05/23 0248  NA  --    < > 139 137 135 141 137  K  --    < > 3.7 4.4 4.3 4.3 4.3  CL  --   --  102 101 103 102 101  CO2  --   --  27 25 22 28 22   GLUCOSE  --   --  98 178* 116* 131*  123*  BUN  --   --  24* 25* 23 25* 30*  CREATININE  --   --  1.28* 1.26* 1.22 1.32* 1.39*  CALCIUM   --   --  9.3 9.3 9.0 9.3 9.2  MG 2.5*  --  2.4  --   --   --  2.3   < > = values in this interval not displayed.    Liver Function Tests: Recent Labs  Lab 07/02/23 0255 07/03/23 0308 07/04/23 0255  AST 28 35 27  ALT 25 29 28   ALKPHOS 71 76 68  BILITOT 0.4 0.8 0.5  PROT 6.6 6.5 6.6  ALBUMIN  3.4* 3.4* 3.6    CBG: Recent Labs  Lab 06/30/23 1459 07/02/23 2144  GLUCAP 137* 207*    Lipid Profile Recent Labs    07/04/23 0255  CHOL 115  HDL 46  LDLCALC 58  TRIG 55  CHOLHDL 2.5    Thyroid  function studies Recent Labs    07/05/23 0248  TSH 14.882*      Urinalysis    Component Value Date/Time   COLORURINE STRAW (A) 07/03/2023 1609   APPEARANCEUR CLEAR 07/03/2023 1609   LABSPEC  1.007 07/03/2023 1609   PHURINE 5.0 07/03/2023 1609   GLUCOSEU >=500 (A) 07/03/2023 1609   HGBUR NEGATIVE 07/03/2023 1609   BILIRUBINUR NEGATIVE 07/03/2023 1609   KETONESUR NEGATIVE 07/03/2023 1609   PROTEINUR NEGATIVE 07/03/2023 1609   NITRITE NEGATIVE 07/03/2023 1609   LEUKOCYTESUR NEGATIVE 07/03/2023 1609      Time coordinating discharge: 35 minutes  SIGNED:  Trenda Mar, MD,  FACP, [facility], [facility], [facility]   [facility] [place]     To contact the attending provider between 7A-7P or the covering provider during after hours 7P-7A, please log into the web site [email or web address] and access using universal [place] password for that web site. If you do not have the password, please call the hospital operator.

## 2023-07-05 NOTE — Progress Notes (Signed)
 [facility] Progress Note  Patient's discharge summary had been completed on 6/23.  I interviewed and evaluated patient in detail this morning.  By communicated with the advanced heart failure team who have cleared him for discharge home.  I have updated his DC summary with today's history, physical exam, changes in discharge medications, follow-up appointments etc.  Kindly refer to DC summary from today by this MD, for details  Trenda Mar, MD,  FACP, [facility], [facility]   [facility] [place]     To contact the attending provider between 7A-7P or the covering provider during after hours 7P-7A, please log into the web site [email or web address] and access using universal [place] password for that web site. If you do not have the password, please call the hospital operator.

## 2023-07-05 NOTE — Discharge Instructions (Signed)

## 2023-07-05 NOTE — Plan of Care (Signed)
 Pt labs and vital signs are at baseline

## 2023-07-06 ENCOUNTER — Encounter: Payer: Self-pay | Admitting: General Practice

## 2023-07-06 ENCOUNTER — Ambulatory Visit: Admitting: General Practice

## 2023-07-06 ENCOUNTER — Ambulatory Visit: Payer: Self-pay | Admitting: General Practice

## 2023-07-06 ENCOUNTER — Other Ambulatory Visit (HOSPITAL_COMMUNITY): Payer: Self-pay

## 2023-07-06 VITALS — BP 100/58 | HR 74 | Temp 98.0°F | Ht 72.0 in | Wt 143.0 lb

## 2023-07-06 DIAGNOSIS — I4891 Unspecified atrial fibrillation: Secondary | ICD-10-CM | POA: Diagnosis not present

## 2023-07-06 DIAGNOSIS — Z7984 Long term (current) use of oral hypoglycemic drugs: Secondary | ICD-10-CM

## 2023-07-06 DIAGNOSIS — I9589 Other hypotension: Secondary | ICD-10-CM | POA: Diagnosis not present

## 2023-07-06 DIAGNOSIS — I5022 Chronic systolic (congestive) heart failure: Secondary | ICD-10-CM

## 2023-07-06 DIAGNOSIS — E038 Other specified hypothyroidism: Secondary | ICD-10-CM | POA: Diagnosis not present

## 2023-07-06 DIAGNOSIS — N179 Acute kidney failure, unspecified: Secondary | ICD-10-CM

## 2023-07-06 DIAGNOSIS — I2510 Atherosclerotic heart disease of native coronary artery without angina pectoris: Secondary | ICD-10-CM | POA: Diagnosis not present

## 2023-07-06 DIAGNOSIS — K219 Gastro-esophageal reflux disease without esophagitis: Secondary | ICD-10-CM

## 2023-07-06 DIAGNOSIS — E118 Type 2 diabetes mellitus with unspecified complications: Secondary | ICD-10-CM

## 2023-07-06 DIAGNOSIS — E7800 Pure hypercholesterolemia, unspecified: Secondary | ICD-10-CM

## 2023-07-06 DIAGNOSIS — I5020 Unspecified systolic (congestive) heart failure: Secondary | ICD-10-CM

## 2023-07-06 DIAGNOSIS — Z09 Encounter for follow-up examination after completed treatment for conditions other than malignant neoplasm: Secondary | ICD-10-CM

## 2023-07-06 LAB — LUPUS ANTICOAGULANT PANEL
DRVVT: 87.3 s — ABNORMAL HIGH (ref 0.0–47.0)
PTT Lupus Anticoagulant: 36.3 s (ref 0.0–43.5)

## 2023-07-06 LAB — CBC
HCT: 43.1 % (ref 39.0–52.0)
Hemoglobin: 13.9 g/dL (ref 13.0–17.0)
MCHC: 32.3 g/dL (ref 30.0–36.0)
MCV: 88.1 fl (ref 78.0–100.0)
Platelets: 237 10*3/uL (ref 150.0–400.0)
RBC: 4.89 Mil/uL (ref 4.22–5.81)
RDW: 15.9 % — ABNORMAL HIGH (ref 11.5–15.5)
WBC: 5.1 10*3/uL (ref 4.0–10.5)

## 2023-07-06 LAB — DRVVT MIX: dRVVT Mix: 56.9 s — ABNORMAL HIGH (ref 0.0–40.4)

## 2023-07-06 LAB — BASIC METABOLIC PANEL WITH GFR
BUN: 28 mg/dL — ABNORMAL HIGH (ref 6–23)
CO2: 31 meq/L (ref 19–32)
Calcium: 9.6 mg/dL (ref 8.4–10.5)
Chloride: 98 meq/L (ref 96–112)
Creatinine, Ser: 1.39 mg/dL (ref 0.40–1.50)
GFR: 52.4 mL/min — ABNORMAL LOW (ref >60.00)
Glucose, Bld: 149 mg/dL — ABNORMAL HIGH (ref 70–99)
Potassium: 4 meq/L (ref 3.5–5.1)
Sodium: 136 meq/L (ref 135–145)

## 2023-07-06 LAB — DRVVT CONFIRM: dRVVT Confirm: 0.9 ratio (ref 0.8–1.2)

## 2023-07-06 LAB — T3, FREE: T3, Free: 2.5 pg/mL (ref 2.0–4.4)

## 2023-07-06 MED ORDER — LEVOTHYROXINE SODIUM 88 MCG PO TABS
88.0000 ug | ORAL_TABLET | Freq: Every day | ORAL | 0 refills | Status: DC
  Filled 2023-07-06: qty 90, 90d supply, fill #0

## 2023-07-06 NOTE — Assessment & Plan Note (Signed)
 Reviewed ER notes, labs, procedure reports and imaging results.

## 2023-07-06 NOTE — Assessment & Plan Note (Signed)
 Controlled. Continue Plavix , rosuvastatin  and Zetia . No aspirin  due to Eliquis  use. Per cardiology.

## 2023-07-06 NOTE — Assessment & Plan Note (Signed)
 Controlled.  Continue Jardiance 10 mg once daily.

## 2023-07-06 NOTE — Patient Instructions (Signed)
 Stop by the lab prior to leaving today. I will notify you of your results once received.   Stop Levothyroxine  75 mcg and Start Levothyroxine  88 mcg once daily. Be sure to take your levothyroxine  (thyroid  medication) every morning on an empty stomach with water only.   No food or other medications for 30 minutes.   No heartburn medication, iron pills, calcium , vitamin D, or magnesium pills within four hours of taking levothyroxine .   F/u with Dr. Rolan.   It was a pleasure to see you today!

## 2023-07-06 NOTE — Assessment & Plan Note (Signed)
 Uncontrolled.  Following with cardiology.  Reviewed hospital notes, labs and imaging and procedure notes.   Discussed LVAD at length with patient.  Discussed palliative care with patient.   At this time, he declines the LVAD. Will discuss more with family and Dr. Rolan at his appointment in July.

## 2023-07-06 NOTE — Assessment & Plan Note (Signed)
 Uncontrolled.   Reviewed thyroid  panel results in my chart.  Increase Levothyroxine  from 75mcg to 88 mcg once daily. Rx sent.   F/u in September as scheduled.  Scheduled for endocrinology in August.

## 2023-07-06 NOTE — Assessment & Plan Note (Signed)
 Controlled.   Continue Crestor  40 mg once daily.

## 2023-07-06 NOTE — Progress Notes (Signed)
 Established Patient Office Visit  Subjective   Patient ID: Garrett Roberts, male    DOB: 03/22/1955  Age: 68 y.o. MRN: [id number]  Chief Complaint  Patient presents with   Hospitalization Follow-up    HPI  Garrett Roberts is a 68 year with past medical history of CAD, HTN, A fib, ischemic cardiomyopathy, chronic HFrEF, chronic hypotension, GERD, type 2 DM, hypothyroidism, CKD, HLD, vitamin D deficiency presents today for a hospitalization follow up.   Hospital course: He was admitted at [facility] from 06/30/23-07/05/23. He presented with shortness of breath and chest pain that he had been experiencing for three days associated with swelling in his feet. Shortness of breath worsens with laying flat. He had been adherent to his medication regimen and even increasing the doses did not alleviate his symptoms. He did not notice any significant weight changes. On admission his labs showed BUN 28, creatinine 148, BNP 1125.8, high-sensitivity troponin 18>19. His chest x-ray showed right perihilar edema, small right pleural effusion and associated right lower lobe opacity thought to be atelectasis. Cardiology was consulted and he had right heard cath on 06/30/23. He felt much better after the catherization. Less orthopnea and able to breathe more comfortably. Palliative was also consulted. Cardiology has recommended LVAD to help improve quality of life. Patient has declined the heart transplant. He met with the LVAD team while in the hospital but has declined the procedure at this time. He is scheduled to follow up with Dr. Rolan in July.   Today he reports that he is feeling much better. Shortness of breath is better. He is feeling much better than he was prior going to the hospital. He is still weak but no edema or shortness of breath or chest pain. He is taking his medications as prescribed. He is in good spirits today. He still does not know if he would like to continue with  the LVAD procedure.   Hypothyroidism: He was evaluated on 06/12/23; his TSH was 22.4. He was asked to increase his levothyroxine  to . While in the hospital, the repeat TSH on 07/05/23 was 14.882, T3 and T4 were within normal range. He reports that he has been taking his medication accurately without missing any doses.    Patient Active Problem List   Diagnosis Date Noted   HFrEF (heart failure with reduced ejection fraction) (HCC) 06/30/2023   Recurrent right pleural effusion 06/30/2023   Chronic hypotension 06/30/2023   Renal insufficiency 06/30/2023   Hypothyroidism 06/30/2023   GERD (gastroesophageal reflux disease) 06/30/2023   Presence of heart assist device (HCC) 06/12/2023   Chronic systolic heart failure (HCC) 06/01/2023   Poor appetite 04/19/2023   Hospital discharge follow-up 04/05/2023   Subclinical hypothyroidism 04/05/2023   Loss of weight 03/23/2023   Protein-calorie malnutrition, severe 03/22/2023   Acute on chronic systolic CHF (congestive heart failure) (HCC) 02/17/2023   Ischemic cardiomyopathy 12/19/2022   Chronic HFrEF (heart failure with reduced ejection fraction) (HCC) 12/19/2022   CKD (chronic kidney disease), 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   Type 2 diabetes mellitus with complication, without long-term current use of insulin  (HCC) 12/15/2022   Atrial fibrillation (HCC) 12/14/2022   Constipation 12/12/2022   Left sided abdominal pain 12/11/2022   Abnormal finding on GI tract imaging 12/11/2022   CAD (coronary artery disease) 10/11/2022   Essential hypertension 10/11/2022   Hyperlipidemia 10/11/2022   Vitamin D deficiency 08/03/2015   Past Medical History:  Diagnosis Date   Acute ST elevation myocardial infarction (STEMI) of anterior  wall (HCC) 12/10/2022   Acute ST elevation myocardial infarction (STEMI) of inferior wall (HCC) 10/30/2021   Atrial fibrillation (HCC)    Atrial fibrillation with RVR (HCC) 12/19/2022   CHF (congestive heart failure) (HCC)     Coronary artery disease    Diabetes mellitus without complication (HCC)    DM (diabetes mellitus) (HCC)    H. pylori infection    Heart attack (HCC)    HLD (hyperlipidemia)    Hypertension    ST elevation myocardial infarction involving left anterior descending (LAD) coronary artery (HCC) 12/10/2022   Past Surgical History:  Procedure Laterality Date   CARDIOVERSION N/A 12/14/2022   Procedure: CARDIOVERSION;  Surgeon: Kate Lonni CROME, MD;  Location: [facility] INVASIVE CV LAB;  Service: Cardiovascular;  Laterality: N/A;   CORONARY/GRAFT ACUTE MI REVASCULARIZATION N/A 10/30/2021   Procedure: Coronary/Graft Acute MI Revascularization;  Surgeon: Ladona Heinz, MD;  Location: [facility] INVASIVE CV LAB;  Service: Cardiovascular;  Laterality: N/A;   CORONARY/GRAFT ACUTE MI REVASCULARIZATION N/A 12/10/2022   Procedure: Coronary/Graft Acute MI Revascularization;  Surgeon: Wonda Sharper, MD;  Location: [facility] INVASIVE CV LAB;  Service: Cardiovascular;  Laterality: N/A;   ESOPHAGOGASTRODUODENOSCOPY N/A 03/23/2023   Procedure: EGD (ESOPHAGOGASTRODUODENOSCOPY);  Surgeon: Stacia Glendia BRAVO, MD;  Location: [facility] ENDOSCOPY;  Service: Gastroenterology;  Laterality: N/A;   ICD IMPLANT N/A 06/01/2023   Procedure: ICD IMPLANT;  Surgeon: Kennyth Chew, MD;  Location: [facility] INVASIVE CV LAB;  Service: Cardiovascular;  Laterality: N/A;   LEFT HEART CATH AND CORONARY ANGIOGRAPHY N/A 10/30/2021   Procedure: LEFT HEART CATH AND CORONARY ANGIOGRAPHY;  Surgeon: Ladona Heinz, MD;  Location: [facility] INVASIVE CV LAB;  Service: Cardiovascular;  Laterality: N/A;   LEFT HEART CATH AND CORONARY ANGIOGRAPHY N/A 12/10/2022   Procedure: LEFT HEART CATH AND CORONARY ANGIOGRAPHY;  Surgeon: Wonda Sharper, MD;  Location: [facility] INVASIVE CV LAB;  Service: Cardiovascular;  Laterality: N/A;   RIGHT HEART CATH N/A 03/20/2023   Procedure: RIGHT HEART CATH;  Surgeon: Rolan Ezra RAMAN, MD;  Location: [facility] INVASIVE CV LAB;  Service: Cardiovascular;  Laterality: N/A;    RIGHT HEART CATH N/A 06/30/2023   Procedure: RIGHT HEART CATH;  Surgeon: Cherrie Toribio SAUNDERS, MD;  Location: [facility] INVASIVE CV LAB;  Service: Cardiovascular;  Laterality: N/A;   Allergies  Allergen Reactions   Beef-Derived Drug Products    Pork-Derived Products Other (See Comments)    Religious observance         07/05/2023   10:54 AM 06/12/2023   11:09 AM 04/05/2023    2:33 PM  Depression screen PHQ 2/9  Decreased Interest 1 1 0  Down, Depressed, Hopeless 1 0 0  PHQ - 2 Score 2 1 0  Altered sleeping 2 0 0  Tired, decreased energy 2 2 0  Change in appetite 3 1 0  Feeling bad or failure about yourself  0 0 0  Trouble concentrating 1 0 0  Moving slowly or fidgety/restless 0 0 0  Suicidal thoughts 0 0 0  PHQ-9 Score 10 4 0  Difficult doing work/chores Somewhat difficult Not difficult at all Not difficult at all       06/12/2023   11:09 AM 04/05/2023    2:33 PM  GAD 7 : Generalized Anxiety Score  Nervous, Anxious, on Edge 0 0  Control/stop worrying 0 0  Worry too much - different things 0 0  Trouble relaxing 0 0  Restless 0 0  Easily annoyed or irritable 0 0  Afraid - awful might happen 0 0  Total GAD 7 Score 0 0  Anxiety Difficulty Not difficult at all Not difficult at all      Review of Systems  Constitutional:  Negative for chills and fever.  Respiratory:  Negative for shortness of breath.   Cardiovascular:  Negative for chest pain, orthopnea and leg swelling.  Gastrointestinal:  Negative for abdominal pain, constipation, diarrhea, heartburn, nausea and vomiting.  Genitourinary:  Negative for dysuria, frequency and urgency.  Neurological:  Negative for dizziness and headaches.  Endo/Heme/Allergies:  Negative for polydipsia.  Psychiatric/Behavioral:  Negative for depression and suicidal ideas. The patient is not nervous/anxious.       Objective:     BP (!) 100/58   Pulse 74   Temp 98 F (36.7 C) (Oral)   Ht 6' (1.829 m)   Wt 143 lb (64.9 kg)   SpO2 99%   BMI  19.39 kg/m  BP Readings from Last 3 Encounters:  07/06/23 (!) 100/58  07/05/23 109/69  06/16/23 102/60   Wt Readings from Last 3 Encounters:  07/06/23 143 lb (64.9 kg)  07/05/23 141 lb 11.2 oz (64.3 kg)  06/16/23 150 lb (68 kg)      Physical Exam Vitals and nursing note reviewed.  Constitutional:      Appearance: Normal appearance.   Cardiovascular:     Rate and Rhythm: Normal rate and regular rhythm.     Pulses: Normal pulses.     Heart sounds: Normal heart sounds.  Pulmonary:     Effort: Pulmonary effort is normal.     Breath sounds: Normal breath sounds.   Neurological:     Mental Status: He is alert and oriented to person, place, and time.   Psychiatric:        Mood and Affect: Mood normal.        Behavior: Behavior normal.        Thought Content: Thought content normal.        Judgment: Judgment normal.      Results for orders placed or performed in visit on 07/06/23  CBC  Result Value Ref Range   WBC 5.1 4.0 - 10.5 K/uL   RBC 4.89 4.22 - 5.81 Mil/uL   Platelets 237.0 150.0 - 400.0 K/uL   Hemoglobin 13.9 13.0 - 17.0 g/dL   HCT 56.8 60.9 - 47.9 %   MCV 88.1 78.0 - 100.0 fl   MCHC 32.3 30.0 - 36.0 g/dL   RDW 84.0 (H) 88.4 - 84.4 %  Basic metabolic panel with GFR  Result Value Ref Range   Sodium 136 135 - 145 mEq/L   Potassium 4.0 3.5 - 5.1 mEq/L   Chloride 98 96 - 112 mEq/L   CO2 31 19 - 32 mEq/L   Glucose, Bld 149 (H) 70 - 99 mg/dL   BUN 28 (H) 6 - 23 mg/dL   Creatinine, Ser 8.60 0.40 - 1.50 mg/dL   GFR 47.59 (L) >39.99 mL/min   Calcium  9.6 8.4 - 10.5 mg/dL       The ASCVD Risk score (Arnett DK, et al., 2019) failed to calculate for the following reasons:   Risk score cannot be calculated because patient has a medical history suggesting prior/existing ASCVD    Assessment & Plan:  Hospital discharge follow-up Assessment & Plan: Reviewed ER notes, labs, procedure reports and imaging results.   Subclinical hypothyroidism Assessment &  Plan: Uncontrolled.   Reviewed thyroid  panel results in my chart.  Increase Levothyroxine  from 75mcg to 88 mcg once daily. Rx sent.  F/u in September as scheduled.  Scheduled for endocrinology in August.  Orders: -     Levothyroxine  Sodium; Take 1 tablet (88 mcg total) by mouth daily.  Dispense: 90 tablet; Refill: 0  Chronic HFrEF (heart failure with reduced ejection fraction) (HCC) -     CBC -     Basic metabolic panel with GFR  Type 2 diabetes mellitus with complication, without long-term current use of insulin  (HCC) Assessment & Plan: Controlled.   Continue Jardiance  10 mg once daily.    HFrEF (heart failure with reduced ejection fraction) (HCC) Assessment & Plan: Uncontrolled.  Following with cardiology.  Reviewed hospital notes, labs and imaging and procedure notes.   Discussed LVAD at length with patient.  Discussed palliative care with patient.   At this time, he declines the LVAD. Will discuss more with family and Dr. Rolan at his appointment in July.    Chronic hypotension Assessment & Plan: Controlled. Continue midodrine  5 mg TID per cardiology.   Atrial fibrillation, unspecified type [facility]) Assessment & Plan: Controlled.   Continue amiodarone , digoxin  and Eliquis  per cardiology.   Coronary artery disease involving native coronary artery of native heart without angina pectoris Assessment & Plan: Controlled. Continue Plavix , rosuvastatin  and Zetia . No aspirin  due to Eliquis  use. Per cardiology.   Pure hypercholesterolemia Assessment & Plan: Controlled.   Continue Crestor  40 mg once daily.   Gastroesophageal reflux disease, unspecified whether esophagitis present Assessment & Plan: Controlled.  Following with gastroenterology. Continue Pantoprazole  40 mg BID.       Return in about 3 months (around 10/06/2023) for chronic care management.    Carrol Aurora, NP

## 2023-07-06 NOTE — Assessment & Plan Note (Addendum)
 Controlled.  Following with gastroenterology. Continue Pantoprazole  40 mg BID.

## 2023-07-06 NOTE — Assessment & Plan Note (Signed)
 Controlled.   Continue amiodarone , digoxin  and Eliquis  per cardiology.

## 2023-07-06 NOTE — Assessment & Plan Note (Signed)
 Controlled. Continue midodrine  5 mg TID per cardiology.

## 2023-07-07 LAB — FACTOR 5 LEIDEN

## 2023-07-09 ENCOUNTER — Other Ambulatory Visit (HOSPITAL_COMMUNITY): Payer: Self-pay | Admitting: Physician Assistant

## 2023-07-09 ENCOUNTER — Other Ambulatory Visit: Payer: Self-pay | Admitting: General Practice

## 2023-07-09 DIAGNOSIS — E038 Other specified hypothyroidism: Secondary | ICD-10-CM

## 2023-07-10 ENCOUNTER — Other Ambulatory Visit: Payer: Self-pay

## 2023-07-10 ENCOUNTER — Encounter (HOSPITAL_COMMUNITY): Payer: Self-pay

## 2023-07-10 ENCOUNTER — Other Ambulatory Visit (HOSPITAL_COMMUNITY): Payer: Self-pay

## 2023-07-11 ENCOUNTER — Ambulatory Visit

## 2023-07-12 ENCOUNTER — Encounter: Payer: Self-pay | Admitting: Internal Medicine

## 2023-07-12 ENCOUNTER — Other Ambulatory Visit (HOSPITAL_COMMUNITY): Payer: Self-pay

## 2023-07-12 ENCOUNTER — Ambulatory Visit

## 2023-07-12 ENCOUNTER — Ambulatory Visit (INDEPENDENT_AMBULATORY_CARE_PROVIDER_SITE_OTHER): Admitting: Internal Medicine

## 2023-07-12 VITALS — BP 98/56 | Ht 72.0 in | Wt 139.6 lb

## 2023-07-12 VITALS — BP 98/56 | HR 60 | Temp 97.7°F | Ht 72.0 in | Wt 139.0 lb

## 2023-07-12 DIAGNOSIS — R1013 Epigastric pain: Secondary | ICD-10-CM | POA: Diagnosis not present

## 2023-07-12 DIAGNOSIS — Z0000 Encounter for general adult medical examination without abnormal findings: Secondary | ICD-10-CM

## 2023-07-12 DIAGNOSIS — Z1211 Encounter for screening for malignant neoplasm of colon: Secondary | ICD-10-CM

## 2023-07-12 MED ORDER — CLOPIDOGREL BISULFATE 75 MG PO TABS
75.0000 mg | ORAL_TABLET | Freq: Every day | ORAL | 3 refills | Status: DC
  Filled 2023-07-12: qty 90, 90d supply, fill #0
  Filled 2023-07-28 – 2023-10-19 (×5): qty 90, 90d supply, fill #1
  Filled 2024-01-25: qty 90, 90d supply, fill #2

## 2023-07-12 NOTE — Progress Notes (Signed)
 Subjective:   [name] is a 68 y.o. who presents for a Medicare Wellness preventive visit.  As a reminder, Annual Wellness Visits don't include a physical exam, and some assessments may be limited, especially if this visit is performed virtually. We may recommend an in-person follow-up visit with your provider if needed.  Visit Complete: In person  Persons Participating in Visit: Patient.  AWV Questionnaire: No: Patient Medicare AWV questionnaire was not completed prior to this visit.  Cardiac Risk Factors include: advanced age (>83men, >55 women);diabetes mellitus;dyslipidemia;male gender;hypertension     Objective:    Today's Vitals   07/12/23 1125 07/12/23 1126  BP:  (!) 98/56  Weight: 139 lb 9.6 oz (63.3 kg)   Height: 6' (1.829 m)   PainSc:  6    Body mass index is 18.93 kg/m.     07/12/2023   11:39 AM 06/30/2023   12:47 AM 06/01/2023   12:40 PM 03/23/2023    9:16 AM 03/20/2023    8:34 AM 03/14/2023    7:45 PM 02/16/2023   10:27 PM  Advanced Directives  Does Patient Have a Medical Advance Directive? No No No No No No No  Would patient like information on creating a medical advance directive?  No - Patient declined No - Patient declined No - Patient declined No - Patient declined  No - Patient declined    Current Medications (verified) Outpatient Encounter Medications as of 07/12/2023  Medication Sig   acetaminophen  (TYLENOL ) 325 MG tablet Take 1-2 tablets (325-650 mg total) by mouth every 4 (four) hours as needed for mild pain (pain score 1-3).   amiodarone  (PACERONE ) 200 MG tablet Take 1 tablet (200 mg total) by mouth daily.   apixaban  (ELIQUIS ) 5 MG TABS tablet Take 1 tablet (5 mg total) by mouth 2 (two) times daily.   clopidogrel  (PLAVIX ) 75 MG tablet Take 1 tablet (75 mg total) by mouth daily.   digoxin  (LANOXIN ) 0.125 MG tablet Take 1 tablet (0.125 mg total) by mouth daily.   empagliflozin  (JARDIANCE ) 10 MG TABS tablet Take 1 tablet (10 mg total) by mouth  daily before breakfast.   ezetimibe  (ZETIA ) 10 MG tablet Take 1 tablet (10 mg total) by mouth daily.   levothyroxine  (SYNTHROID ) 88 MCG tablet Take 1 tablet (88 mcg total) by mouth daily.   linaclotide  (LINZESS ) 145 MCG CAPS capsule Take 1 capsule (145 mcg total) by mouth daily before breakfast.   melatonin 5 MG TABS Take 5 mg by mouth at bedtime as needed (sleep).   midodrine  (PROAMATINE ) 5 MG tablet Take 1 tablet (5 mg total) by mouth 3 (three) times daily with meals.   pantoprazole  (PROTONIX ) 40 MG tablet Take 1 tablet (40 mg total) by mouth 2 (two) times daily before a meal.   potassium chloride  SA (KLOR-CON  M) 20 MEQ tablet Take 2 tablets (40 mEq total) by mouth daily.   rosuvastatin  (CRESTOR ) 40 MG tablet Take 40 mg by mouth daily.   torsemide  (DEMADEX ) 20 MG tablet Take 2 tablets (40 mg total) by mouth daily.   Vitamin D-Vitamin K (VITAMIN K2-VITAMIN D3 PO) Take 1 tablet by mouth daily with lunch.   No facility-administered encounter medications on file as of 07/12/2023.    Allergies (verified) Beef-derived drug products and Pork-derived products   History: Past Medical History:  Diagnosis Date   Acute ST elevation myocardial infarction (STEMI) of anterior wall (HCC) 12/10/2022   Acute ST elevation myocardial infarction (STEMI) of inferior wall (HCC) 10/30/2021   Atrial  fibrillation (HCC)    Atrial fibrillation with RVR (HCC) 12/19/2022   CHF (congestive heart failure) (HCC)    Coronary artery disease    Diabetes mellitus without complication (HCC)    DM (diabetes mellitus) (HCC)    H. pylori infection    Heart attack (HCC)    HLD (hyperlipidemia)    Hypertension    ST elevation myocardial infarction involving left anterior descending (LAD) coronary artery (HCC) 12/10/2022   Past Surgical History:  Procedure Laterality Date   CARDIOVERSION N/A 12/14/2022   Procedure: CARDIOVERSION;  Surgeon: Kate Lonni CROME, MD;  Location: [facility] INVASIVE CV LAB;  Service:  Cardiovascular;  Laterality: N/A;   CORONARY/GRAFT ACUTE MI REVASCULARIZATION N/A 10/30/2021   Procedure: Coronary/Graft Acute MI Revascularization;  Surgeon: Ladona Heinz, MD;  Location: [facility] INVASIVE CV LAB;  Service: Cardiovascular;  Laterality: N/A;   CORONARY/GRAFT ACUTE MI REVASCULARIZATION N/A 12/10/2022   Procedure: Coronary/Graft Acute MI Revascularization;  Surgeon: Wonda Sharper, MD;  Location: [facility] INVASIVE CV LAB;  Service: Cardiovascular;  Laterality: N/A;   ESOPHAGOGASTRODUODENOSCOPY N/A 03/23/2023   Procedure: EGD (ESOPHAGOGASTRODUODENOSCOPY);  Surgeon: Stacia Glendia BRAVO, MD;  Location: [facility] ENDOSCOPY;  Service: Gastroenterology;  Laterality: N/A;   ICD IMPLANT N/A 06/01/2023   Procedure: ICD IMPLANT;  Surgeon: Kennyth Chew, MD;  Location: [facility] INVASIVE CV LAB;  Service: Cardiovascular;  Laterality: N/A;   LEFT HEART CATH AND CORONARY ANGIOGRAPHY N/A 10/30/2021   Procedure: LEFT HEART CATH AND CORONARY ANGIOGRAPHY;  Surgeon: Ladona Heinz, MD;  Location: [facility] INVASIVE CV LAB;  Service: Cardiovascular;  Laterality: N/A;   LEFT HEART CATH AND CORONARY ANGIOGRAPHY N/A 12/10/2022   Procedure: LEFT HEART CATH AND CORONARY ANGIOGRAPHY;  Surgeon: Wonda Sharper, MD;  Location: [facility] INVASIVE CV LAB;  Service: Cardiovascular;  Laterality: N/A;   RIGHT HEART CATH N/A 03/20/2023   Procedure: RIGHT HEART CATH;  Surgeon: Rolan Ezra RAMAN, MD;  Location: [facility] INVASIVE CV LAB;  Service: Cardiovascular;  Laterality: N/A;   RIGHT HEART CATH N/A 06/30/2023   Procedure: RIGHT HEART CATH;  Surgeon: Cherrie Toribio SAUNDERS, MD;  Location: [facility] INVASIVE CV LAB;  Service: Cardiovascular;  Laterality: N/A;   Family History  Problem Relation Age of Onset   Diabetes Mother    Chronic Renal Failure Father    Diabetes Father    Chronic Renal Failure Brother    Diabetes Brother    Diabetes Brother    Heart attack Brother    Stroke Brother    Social History   Socioeconomic History   Marital status: Married    Spouse  name: Amani   Number of children: 5   Years of education: Not on file   Highest education level: High school graduate  Occupational History   Occupation: part time  Tobacco Use   Smoking status: Never   Smokeless tobacco: Never  Vaping Use   Vaping status: Never Used  Substance and Sexual Activity   Alcohol use: No   Drug use: No   Sexual activity: Not on file  Other Topics Concern   Not on file  Social History Narrative   ** Merged History Encounter **       Social Drivers of Health   Financial Resource Strain: Low Risk  (07/12/2023)   Overall Financial Resource Strain (CARDIA)    Difficulty of Paying Living Expenses: Not hard at all  Food Insecurity: No Food Insecurity (07/12/2023)   Hunger Vital Sign    Worried About Running Out of Food in the Last Year: Never true  Ran Out of Food in the Last Year: Never true  Transportation Needs: No Transportation Needs (07/12/2023)   PRAPARE - Administrator, Civil Service (Medical): No    Lack of Transportation (Non-Medical): No  Physical Activity: Sufficiently Active (07/12/2023)   Exercise Vital Sign    Days of Exercise per Week: 5 days    Minutes of Exercise per Session: 40 min  Stress: No Stress Concern Present (07/12/2023)   [employer] of Occupational Health - Occupational Stress Questionnaire    Feeling of Stress: Not at all  Social Connections: Moderately Integrated (07/12/2023)   Social Connection and Isolation Panel    Frequency of Communication with Friends and Family: More than three times a week    Frequency of Social Gatherings with Friends and Family: More than three times a week    Attends Religious Services: More than 4 times per year    Active Member of [employer] or Organizations: No    Attends Banker Meetings: Never    Marital Status: Married    Tobacco Counseling Counseling given: Not Answered    Clinical Intake:  Pre-visit preparation completed: Yes  Pain : 0-10 Pain Score: 6   Pain Type: Acute pain Pain Location: Abdomen Pain Orientation: Left Pain Descriptors / Indicators: Aching, Sharp Pain Onset: Yesterday Pain Frequency: Constant Pain Relieving Factors: drinking water helped  Pain Relieving Factors: drinking water helped  BMI - recorded: 18.93 Nutritional Risks: None Diabetes: Yes CBG done?: No Did pt. bring in CBG monitor from home?: No  Lab Results  Component Value Date   HGBA1C 7.0 (A) 06/12/2023   HGBA1C 7.8 (H) 02/16/2023   HGBA1C 11.2 (H) 12/10/2022     How often do you need to have someone help you when you read instructions, pamphlets, or other written materials from your doctor or pharmacy?: 1 - Never  Interpreter Needed?: No  Comments: lives with wife and 3 daughters Information entered by :: B.Hallee Mckenny,LPN   Activities of Daily Living     07/12/2023   11:39 AM 06/30/2023    8:39 PM  In your present state of health, do you have any difficulty performing the following activities:  Hearing? 0 0  Vision? 0 0  Difficulty concentrating or making decisions? 0 0  Walking or climbing stairs? 0   Dressing or bathing? 0   Doing errands, shopping? 0 0  Preparing Food and eating ? N   Using the Toilet? N   In the past six months, have you accidently leaked urine? N   Do you have problems with loss of bowel control? N   Managing your Medications? N   Managing your Finances? N   Housekeeping or managing your Housekeeping? N     Patient Care Team: Vincente Shivers, NP as PCP - General (General Practice) Elmira Newman PARAS, MD as PCP - Cardiology (Cardiology) Kennyth Chew, MD as PCP - Electrophysiology (Cardiology)  I have updated your Care Teams any recent Medical Services you may have received from other providers in the past year.     Assessment:   This is a routine wellness examination for Garrett Roberts.  Hearing/Vision screen Hearing Screening - Comments:: Pt says his hearing is good Vision Screening - Comments:: Pt says his  vision is good; readers only;seen 2 weeks ago but does not remember provider   Goals Addressed             This Visit's Progress    Patient Stated  I would like to be/stay healthy       Depression Screen     07/12/2023   11:37 AM 07/05/2023   10:54 AM 06/12/2023   11:09 AM 04/05/2023    2:33 PM  PHQ 2/9 Scores  PHQ - 2 Score 0 2 1 0  PHQ- 9 Score  10 4 0    Fall Risk     07/12/2023   11:33 AM 06/12/2023   11:09 AM 04/05/2023    2:33 PM  Fall Risk   Falls in the past year? 0 0 0  Number falls in past yr: 0 0 0  Injury with Fall? 0 0 0  Risk for fall due to : No Fall Risks No Fall Risks No Fall Risks  Follow up Falls prevention discussed;Education provided Falls evaluation completed Falls evaluation completed    MEDICARE RISK AT HOME:  Medicare Risk at Home Any stairs in or around the home?: Yes If so, are there any without handrails?: Yes Home free of loose throw rugs in walkways, pet beds, electrical cords, etc?: Yes Adequate lighting in your home to reduce risk of falls?: Yes Life alert?: No Use of a cane, walker or w/c?: No Grab bars in the bathroom?: No Shower chair or bench in shower?: No Elevated toilet seat or a handicapped toilet?: No  TIMED UP AND GO:  Was the test performed?  Yes  Length of time to ambulate 10 feet: 8 sec Gait steady and fast without use of assistive device  Cognitive Function: 6CIT completed        07/12/2023   12:01 PM  6CIT Screen  What Year? 0 points  What month? 0 points  What time? 0 points  Count back from 20 0 points  Months in reverse 0 points  Repeat phrase 0 points  Total Score 0 points    Immunizations Immunization History  Administered Date(s) Administered   Influenza,inj,Quad PF,6+ Mos 01/23/2015   Influenza,inj,quad, With Preservative 11/05/2013   PFIZER(Purple Top)SARS-COV-2 Vaccination 03/20/2019, 04/17/2019, 01/07/2020   PNEUMOCOCCAL CONJUGATE-20 04/12/2023    Screening Tests Health Maintenance   Topic Date Due   DTaP/Tdap/Td (1 - Tdap) Never done   Zoster Vaccines- Shingrix (1 of 2) Never done   Colonoscopy  11/07/2023   COVID-19 Vaccine (4 - 2024-25 season) 04/21/2027 (Originally 09/11/2022)   INFLUENZA VACCINE  08/11/2023   HEMOGLOBIN A1C  12/12/2023   Diabetic kidney evaluation - Urine ACR  04/11/2024   FOOT EXAM  04/11/2024   OPHTHALMOLOGY EXAM  06/26/2024   Diabetic kidney evaluation - eGFR measurement  07/05/2024   Medicare Annual Wellness (AWV)  07/11/2024   Pneumococcal Vaccine: 50+ Years  Completed   Hepatitis C Screening  Completed   Hepatitis B Vaccines  Aged Out   HPV VACCINES  Aged Out   Meningococcal B Vaccine  Aged Out    Health Maintenance  Health Maintenance Due  Topic Date Due   DTaP/Tdap/Td (1 - Tdap) Never done   Zoster Vaccines- Shingrix (1 of 2) Never done   Colonoscopy  11/07/2023   Health Maintenance Items Addressed:   Additional Screening:  Vision Screening: Recommended annual ophthalmology exams for early detection of glaucoma and other disorders of the eye. Would you like a referral to an eye doctor? No    Dental Screening: Recommended annual dental exams for proper oral hygiene  Community Resource Referral / Chronic Care Management: CRR required this visit?  No   CCM required this visit?  Bzd:djfz day appt for  abdominal pain   Plan:    I have personally reviewed and noted the following in the patient's chart:   Medical and social history Use of alcohol, tobacco or illicit drugs  Current medications and supplements including opioid prescriptions. Patient is not currently taking opioid prescriptions. Functional ability and status Nutritional status Physical activity Advanced directives List of other physicians Hospitalizations, surgeries, and ER visits in previous 12 months Vitals Screenings to include cognitive, depression, and falls Referrals and appointments  In addition, I have reviewed and discussed with patient  certain preventive protocols, quality metrics, and best practice recommendations. A written personalized care plan for preventive services as well as general preventive health recommendations were provided to patient.   Erminio LITTIE Saris, LPN   02/17/7972   After Visit Summary: (MyChart) Due to this being a telephonic visit, the after visit summary with patients personalized plan was offered to patient via MyChart   Notes: Please refer to Routing Comments.

## 2023-07-12 NOTE — Assessment & Plan Note (Signed)
 Different than when he was hospitalized in March Had fairly benign EGD then CT of abdomen negative for aortic abnormality (most serious thing that comes to mind would be dissection) Is on PPI Doubt this could be related to constipation Will ask GI to see him again Urgent abd ultrasound just to check aorta (though fairly normal in March)  ?try maalox or mylanta

## 2023-07-12 NOTE — Progress Notes (Addendum)
 Subjective:    Patient ID: Garrett Roberts, male    DOB: [birth year], 68 y.o.   MRN: [id number]  HPI Here due to abdominal pain  Has pain --points to epigastrium Moves to left side Started 2 days ago--but worse yesterday Fairly constant now Burning character--no radiation to chest or back  No N/V Appetite is gone---had some yogurt yesterday (didn't worsen pain--some relief) Takes the pantoprazole  daily---no history of ulcer  No NSAIDs Bowels are slow--may go every 2-3 days. Has to push hard at times Did go yesterday morning---hard but fair amount  No recent heavy lifting or new tasks that could cause muscular injury  Current Outpatient Medications on File Prior to Visit  Medication Sig Dispense Refill   acetaminophen  (TYLENOL ) 325 MG tablet Take 1-2 tablets (325-650 mg total) by mouth every 4 (four) hours as needed for mild pain (pain score 1-3).     amiodarone  (PACERONE ) 200 MG tablet Take 1 tablet (200 mg total) by mouth daily. 30 tablet 5   apixaban  (ELIQUIS ) 5 MG TABS tablet Take 1 tablet (5 mg total) by mouth 2 (two) times daily.     clopidogrel  (PLAVIX ) 75 MG tablet Take 1 tablet (75 mg total) by mouth daily. 30 tablet 2   digoxin  (LANOXIN ) 0.125 MG tablet Take 1 tablet (0.125 mg total) by mouth daily. 90 tablet 3   empagliflozin  (JARDIANCE ) 10 MG TABS tablet Take 1 tablet (10 mg total) by mouth daily before breakfast.     ezetimibe  (ZETIA ) 10 MG tablet Take 1 tablet (10 mg total) by mouth daily. 90 tablet 3   levothyroxine  (SYNTHROID ) 88 MCG tablet Take 1 tablet (88 mcg total) by mouth daily. 90 tablet 0   linaclotide  (LINZESS ) 145 MCG CAPS capsule Take 1 capsule (145 mcg total) by mouth daily before breakfast.     melatonin 5 MG TABS Take 5 mg by mouth at bedtime as needed (sleep).     midodrine  (PROAMATINE ) 5 MG tablet Take 1 tablet (5 mg total) by mouth 3 (three) times daily with meals. 90 tablet 0   pantoprazole  (PROTONIX ) 40 MG tablet Take 1 tablet (40 mg total)  by mouth 2 (two) times daily before a meal. 180 tablet 3   potassium chloride  SA (KLOR-CON  M) 20 MEQ tablet Take 2 tablets (40 mEq total) by mouth daily. 90 tablet 3   rosuvastatin  (CRESTOR ) 40 MG tablet Take 40 mg by mouth daily.     torsemide  (DEMADEX ) 20 MG tablet Take 2 tablets (40 mg total) by mouth daily. 180 tablet 0   Vitamin D-Vitamin K (VITAMIN K2-VITAMIN D3 PO) Take 1 tablet by mouth daily with lunch.     No current facility-administered medications on file prior to visit.    Allergies  Allergen Reactions   Beef-Derived Drug Products    Pork-Derived Products Other (See Comments)    Religious observance    Past Medical History:  Diagnosis Date   Acute ST elevation myocardial infarction (STEMI) of anterior wall (HCC) 12/10/2022   Acute ST elevation myocardial infarction (STEMI) of inferior wall (HCC) 10/30/2021   Atrial fibrillation (HCC)    Atrial fibrillation with RVR (HCC) 12/19/2022   CHF (congestive heart failure) (HCC)    Coronary artery disease    Diabetes mellitus without complication (HCC)    DM (diabetes mellitus) (HCC)    H. pylori infection    Heart attack (HCC)    HLD (hyperlipidemia)    Hypertension    ST elevation myocardial infarction involving left anterior descending (  LAD) coronary artery (HCC) 12/10/2022    Past Surgical History:  Procedure Laterality Date   CARDIOVERSION N/A 12/14/2022   Procedure: CARDIOVERSION;  Surgeon: Kate Lonni CROME, MD;  Location: [facility] INVASIVE CV LAB;  Service: Cardiovascular;  Laterality: N/A;   CORONARY/GRAFT ACUTE MI REVASCULARIZATION N/A 10/30/2021   Procedure: Coronary/Graft Acute MI Revascularization;  Surgeon: Ladona Heinz, MD;  Location: [facility] INVASIVE CV LAB;  Service: Cardiovascular;  Laterality: N/A;   CORONARY/GRAFT ACUTE MI REVASCULARIZATION N/A 12/10/2022   Procedure: Coronary/Graft Acute MI Revascularization;  Surgeon: Wonda Sharper, MD;  Location: [facility] INVASIVE CV LAB;  Service: Cardiovascular;  Laterality:  N/A;   ESOPHAGOGASTRODUODENOSCOPY N/A 03/23/2023   Procedure: EGD (ESOPHAGOGASTRODUODENOSCOPY);  Surgeon: Stacia Glendia BRAVO, MD;  Location: [facility] ENDOSCOPY;  Service: Gastroenterology;  Laterality: N/A;   ICD IMPLANT N/A 06/01/2023   Procedure: ICD IMPLANT;  Surgeon: Kennyth Chew, MD;  Location: [facility] INVASIVE CV LAB;  Service: Cardiovascular;  Laterality: N/A;   LEFT HEART CATH AND CORONARY ANGIOGRAPHY N/A 10/30/2021   Procedure: LEFT HEART CATH AND CORONARY ANGIOGRAPHY;  Surgeon: Ladona Heinz, MD;  Location: [facility] INVASIVE CV LAB;  Service: Cardiovascular;  Laterality: N/A;   LEFT HEART CATH AND CORONARY ANGIOGRAPHY N/A 12/10/2022   Procedure: LEFT HEART CATH AND CORONARY ANGIOGRAPHY;  Surgeon: Wonda Sharper, MD;  Location: [facility] INVASIVE CV LAB;  Service: Cardiovascular;  Laterality: N/A;   RIGHT HEART CATH N/A 03/20/2023   Procedure: RIGHT HEART CATH;  Surgeon: Rolan Ezra RAMAN, MD;  Location: [facility] INVASIVE CV LAB;  Service: Cardiovascular;  Laterality: N/A;   RIGHT HEART CATH N/A 06/30/2023   Procedure: RIGHT HEART CATH;  Surgeon: Cherrie Toribio SAUNDERS, MD;  Location: [facility] INVASIVE CV LAB;  Service: Cardiovascular;  Laterality: N/A;    Family History  Problem Relation Age of Onset   Diabetes Mother    Chronic Renal Failure Father    Diabetes Father    Chronic Renal Failure Brother    Diabetes Brother    Diabetes Brother    Heart attack Brother    Stroke Brother     Social History   Socioeconomic History   Marital status: Married    Spouse name: Amani   Number of children: 5   Years of education: Not on file   Highest education level: High school graduate  Occupational History   Occupation: part time  Tobacco Use   Smoking status: Never   Smokeless tobacco: Never  Vaping Use   Vaping status: Never Used  Substance and Sexual Activity   Alcohol use: No   Drug use: No   Sexual activity: Not on file  Other Topics Concern   Not on file  Social History Narrative   ** Merged History  Encounter **       Social Drivers of Health   Financial Resource Strain: Low Risk  (07/12/2023)   Overall Financial Resource Strain (CARDIA)    Difficulty of Paying Living Expenses: Not hard at all  Food Insecurity: No Food Insecurity (07/12/2023)   Hunger Vital Sign    Worried About Running Out of Food in the Last Year: Never true    Ran Out of Food in the Last Year: Never true  Transportation Needs: No Transportation Needs (07/12/2023)   PRAPARE - Administrator, Civil Service (Medical): No    Lack of Transportation (Non-Medical): No  Physical Activity: Sufficiently Active (07/12/2023)   Exercise Vital Sign    Days of Exercise per Week: 5 days    Minutes of Exercise per  Session: 40 min  Stress: No Stress Concern Present (07/12/2023)   [employer] of Occupational Health - Occupational Stress Questionnaire    Feeling of Stress: Not at all  Social Connections: Moderately Integrated (07/12/2023)   Social Connection and Isolation Panel    Frequency of Communication with Friends and Family: More than three times a week    Frequency of Social Gatherings with Friends and Family: More than three times a week    Attends Religious Services: More than 4 times per year    Active Member of [employer] or Organizations: No    Attends Banker Meetings: Never    Marital Status: Married  Catering manager Violence: Not At Risk (07/12/2023)   Humiliation, Afraid, Rape, and Kick questionnaire    Fear of Current or Ex-Partner: No    Emotionally Abused: No    Physically Abused: No    Sexually Abused: No   Review of Systems No dysuria or urgency (other than when taking the torsemide ) No fever No jaundice No cough or SOB     Objective:   Physical Exam Constitutional:      Appearance: Normal appearance.  Cardiovascular:     Rate and Rhythm: Normal rate and regular rhythm.     Heart sounds: No murmur heard.    No gallop.  Pulmonary:     Effort: Pulmonary effort is normal.      Breath sounds: Normal breath sounds. No wheezing or rales.  Abdominal:     General: Bowel sounds are normal.     Comments: Flat and slim Mild epigastric tenderness but able to palpate deeply Aorta is notably palpable (but he is very slim)  Musculoskeletal:     Cervical back: Neck supple.     Right lower leg: No edema.     Left lower leg: No edema.  Lymphadenopathy:     Cervical: No cervical adenopathy.  Neurological:     Mental Status: He is alert.            Assessment & Plan:

## 2023-07-12 NOTE — Patient Instructions (Signed)
 Mr. Garrett Roberts , Thank you for taking time out of your busy schedule to complete your Annual Wellness Visit with me. I enjoyed our conversation and look forward to speaking with you again next year. I, as well as your care team,  appreciate your ongoing commitment to your health goals. Please review the following plan we discussed and let me know if I can assist you in the future. Your Game plan/ To Do List    Referrals: If you haven't heard from the office you've been referred to, please reach out to them at the phone provided.  A referral has been placed for colonoscopy to:  [facility] [address] [place], [address], [place], [place] [address] [phone] [email or web address] Open  Closes 5 PM Follow up Visits: Next Medicare AWV with our clinical staff: 07/16/24 @ 11:30am in person   Have you seen your provider in the last 6 months (3 months if uncontrolled diabetes)? Yes Next Office Visit with your provider: 09/12/23 @ 10am  Clinician Recommendations:  Aim for 30 minutes of exercise or brisk walking, 6-8 glasses of water, and 5 servings of fruits and vegetables each day.       This is a list of the screening recommended for you and due dates:  Health Maintenance  Topic Date Due   DTaP/Tdap/Td vaccine (1 - Tdap) Never done   Zoster (Shingles) Vaccine (1 of 2) Never done   Colon Cancer Screening  11/07/2023   COVID-19 Vaccine (4 - 2024-25 season) 04/21/2027*   Flu Shot  08/11/2023   Hemoglobin A1C  12/12/2023   Yearly kidney health urinalysis for diabetes  04/11/2024   Complete foot exam   04/11/2024   Eye exam for diabetics  06/26/2024   Yearly kidney function blood test for diabetes  07/05/2024   Medicare Annual Wellness Visit  07/11/2024   Pneumococcal Vaccine for age over 28  Completed   Hepatitis C Screening  Completed   Hepatitis B Vaccine  Aged Out   HPV Vaccine  Aged Out   Meningitis B Vaccine  Aged Out  *Topic was postponed. The date shown is not the original  due date.    Advanced directives: (Declined) Advance directive discussed with you today. Even though you declined this today, please call our office should you change your mind, and we can give you the proper paperwork for you to fill out. Advance Care Planning is important because it:  [x]  Makes sure you receive the medical care that is consistent with your values, goals, and preferences  [x]  It provides guidance to your family and loved ones and reduces their decisional burden about whether or not they are making the right decisions based on your wishes.  Follow the link provided in your after visit summary or read over the paperwork we have mailed to you to help you started getting your Advance Directives in place. If you need assistance in completing these, please reach out to us  so that we can help you!

## 2023-07-13 ENCOUNTER — Telehealth: Payer: Self-pay | Admitting: General Practice

## 2023-07-13 NOTE — Telephone Encounter (Signed)
 Called patient to check in on him this morning.  He reports pain is slightly better. He has not started Maalox or Mylanta.  No fever, chills, chest pain or shortness of breath.  Pain is different than before when he had H.pylori.   Reviewed abdominal x-ray results from June with him.  Discussed that this could be secondary to the moderate amount of stool that was seen on 06/16/23 or it could be related to ischemia.   ER precautions discussed at length given the office will be closed tomorrow. Verbalizes understanding. Patient will monitor his symptoms.  He will call GI if symptoms worsen.   Schedule for his ultrasound on Monday.

## 2023-07-17 ENCOUNTER — Ambulatory Visit: Attending: Internal Medicine

## 2023-07-19 ENCOUNTER — Ambulatory Visit (INDEPENDENT_AMBULATORY_CARE_PROVIDER_SITE_OTHER)

## 2023-07-19 ENCOUNTER — Ambulatory Visit: Payer: Self-pay | Admitting: Internal Medicine

## 2023-07-19 ENCOUNTER — Ambulatory Visit
Admission: RE | Admit: 2023-07-19 | Discharge: 2023-07-19 | Disposition: A | Discharge status: Home / Self Care | Source: Ambulatory Visit | Attending: Cardiology | Admitting: Cardiology

## 2023-07-19 ENCOUNTER — Other Ambulatory Visit (HOSPITAL_COMMUNITY): Payer: Self-pay

## 2023-07-19 ENCOUNTER — Ambulatory Visit

## 2023-07-19 ENCOUNTER — Other Ambulatory Visit: Payer: Self-pay

## 2023-07-19 ENCOUNTER — Encounter (HOSPITAL_COMMUNITY): Payer: Self-pay | Admitting: Cardiology

## 2023-07-19 ENCOUNTER — Ambulatory Visit (HOSPITAL_COMMUNITY): Payer: Self-pay | Admitting: Cardiology

## 2023-07-19 VITALS — BP 105/70 | HR 73 | Wt 141.8 lb

## 2023-07-19 DIAGNOSIS — J9589 Other postprocedural complications and disorders of respiratory system, not elsewhere classified: Secondary | ICD-10-CM | POA: Insufficient documentation

## 2023-07-19 DIAGNOSIS — I252 Old myocardial infarction: Secondary | ICD-10-CM | POA: Insufficient documentation

## 2023-07-19 DIAGNOSIS — Z79899 Other long term (current) drug therapy: Secondary | ICD-10-CM | POA: Insufficient documentation

## 2023-07-19 DIAGNOSIS — Z7984 Long term (current) use of oral hypoglycemic drugs: Secondary | ICD-10-CM | POA: Diagnosis not present

## 2023-07-19 DIAGNOSIS — I130 Hypertensive heart and chronic kidney disease with heart failure and stage 1 through stage 4 chronic kidney disease, or unspecified chronic kidney disease: Secondary | ICD-10-CM | POA: Diagnosis not present

## 2023-07-19 DIAGNOSIS — I2510 Atherosclerotic heart disease of native coronary artery without angina pectoris: Secondary | ICD-10-CM | POA: Diagnosis not present

## 2023-07-19 DIAGNOSIS — I9589 Other hypotension: Secondary | ICD-10-CM | POA: Diagnosis not present

## 2023-07-19 DIAGNOSIS — R6881 Early satiety: Secondary | ICD-10-CM | POA: Insufficient documentation

## 2023-07-19 DIAGNOSIS — I255 Ischemic cardiomyopathy: Secondary | ICD-10-CM | POA: Diagnosis not present

## 2023-07-19 DIAGNOSIS — N189 Chronic kidney disease, unspecified: Secondary | ICD-10-CM | POA: Diagnosis not present

## 2023-07-19 DIAGNOSIS — E1122 Type 2 diabetes mellitus with diabetic chronic kidney disease: Secondary | ICD-10-CM | POA: Insufficient documentation

## 2023-07-19 DIAGNOSIS — I959 Hypotension, unspecified: Secondary | ICD-10-CM | POA: Diagnosis not present

## 2023-07-19 DIAGNOSIS — Z95810 Presence of automatic (implantable) cardiac defibrillator: Secondary | ICD-10-CM | POA: Diagnosis not present

## 2023-07-19 DIAGNOSIS — I480 Paroxysmal atrial fibrillation: Secondary | ICD-10-CM | POA: Diagnosis not present

## 2023-07-19 DIAGNOSIS — Z955 Presence of coronary angioplasty implant and graft: Secondary | ICD-10-CM | POA: Diagnosis not present

## 2023-07-19 DIAGNOSIS — R1013 Epigastric pain: Secondary | ICD-10-CM | POA: Insufficient documentation

## 2023-07-19 DIAGNOSIS — Z7901 Long term (current) use of anticoagulants: Secondary | ICD-10-CM | POA: Diagnosis not present

## 2023-07-19 DIAGNOSIS — R5383 Other fatigue: Secondary | ICD-10-CM | POA: Diagnosis not present

## 2023-07-19 DIAGNOSIS — R109 Unspecified abdominal pain: Secondary | ICD-10-CM | POA: Insufficient documentation

## 2023-07-19 DIAGNOSIS — Z7902 Long term (current) use of antithrombotics/antiplatelets: Secondary | ICD-10-CM | POA: Insufficient documentation

## 2023-07-19 DIAGNOSIS — E785 Hyperlipidemia, unspecified: Secondary | ICD-10-CM | POA: Insufficient documentation

## 2023-07-19 DIAGNOSIS — I700 Atherosclerosis of aorta: Secondary | ICD-10-CM | POA: Insufficient documentation

## 2023-07-19 DIAGNOSIS — N182 Chronic kidney disease, stage 2 (mild): Secondary | ICD-10-CM

## 2023-07-19 DIAGNOSIS — I2729 Other secondary pulmonary hypertension: Secondary | ICD-10-CM | POA: Insufficient documentation

## 2023-07-19 DIAGNOSIS — I5022 Chronic systolic (congestive) heart failure: Secondary | ICD-10-CM | POA: Diagnosis not present

## 2023-07-19 LAB — BRAIN NATRIURETIC PEPTIDE: B Natriuretic Peptide: 1374 pg/mL — ABNORMAL HIGH (ref 0.0–100.0)

## 2023-07-19 LAB — COMPREHENSIVE METABOLIC PANEL WITH GFR
ALT: 25 U/L (ref 0–44)
AST: 27 U/L (ref 15–41)
Albumin: 3.7 g/dL (ref 3.5–5.0)
Alkaline Phosphatase: 75 U/L (ref 38–126)
Anion gap: 10 (ref 5–15)
BUN: 22 mg/dL (ref 8–23)
CO2: 27 mmol/L (ref 22–32)
Calcium: 9.4 mg/dL (ref 8.9–10.3)
Chloride: 99 mmol/L (ref 98–111)
Creatinine, Ser: 1.46 mg/dL — ABNORMAL HIGH (ref 0.61–1.24)
GFR, Estimated: 52 mL/min — ABNORMAL LOW (ref >60)
Glucose, Bld: 227 mg/dL — ABNORMAL HIGH (ref 70–99)
Potassium: 4.1 mmol/L (ref 3.5–5.1)
Sodium: 136 mmol/L (ref 135–145)
Total Bilirubin: 0.8 mg/dL (ref 0.0–1.2)
Total Protein: 7.4 g/dL (ref 6.5–8.1)

## 2023-07-19 LAB — DIGOXIN LEVEL: Digoxin Level: 0.8 ng/mL (ref 0.8–2.0)

## 2023-07-19 LAB — TSH: TSH: 9.22 u[IU]/mL — ABNORMAL HIGH (ref 0.350–4.500)

## 2023-07-19 MED ORDER — TADALAFIL 20 MG PO TABS
20.0000 mg | ORAL_TABLET | Freq: Every day | ORAL | 3 refills | Status: AC
  Filled 2023-07-19 – 2023-09-07 (×4): qty 90, 90d supply, fill #0
  Filled 2023-10-06 – 2023-12-08 (×3): qty 90, 90d supply, fill #1
  Filled 2024-01-25 – 2024-02-15 (×2): qty 90, 90d supply, fill #2

## 2023-07-19 NOTE — Patient Instructions (Signed)
 Start Tadalafil  20 mg daily Please call VAD coordinators once you have your travel dates. We will schedule you a follow up appt for when you return. [phone]

## 2023-07-19 NOTE — Progress Notes (Signed)
 Advanced Heart Failure Clinic Note  PCP: Vincente Shivers, NP HF Cardiology: Dr. Rolan  Chief complaint: CHF  68 y.o. with history of CAD, DM II, HTN, HLD, ischemic cardiomyopathy/chronic systolic CHF.   Patient had inferior STEMI 10/23 s/p PCI/DES to RCA. Had residual diffuse disease in diagonals and OMs. EF 40-45% at time of cath. Subsequent echo in 10/23 with EF 55-60%.   He was not taking medications in 2024 after losing insurance. Presented with anterior STEMI 12/10/22. Cardiac cath with 100% proximal LAD treated with PCI/DES. Prior RCA stent patent, severe disease in tortuous ramus and distal LCx treated medically.  Echo with EF 25%, no LV thrombus, AK mid septum to apex, moderately reduced RV systolic function. Course complicated by atrial fibrillation with RVR. Started on IV amiodarone  and underwent DCCV to SR.  GDMT limited by soft blood pressure. Also seen by GI for abdominal pain. Had thickening of stomach on CT scan. H. Pylori later resulted positive after discharge, seen by GI and started on treatment for H pylori-related gastritis.   Returned to ED 12/19/22 with recurrent atrial fibrillation. He converted to SR while in waiting room and was discharged home.  Admitted 2/25 with acute CHF. Diuresed with IV lasix . GDMT cut back 2/2 to low BP, Entresto  and spironolactone  stopped. He was discharged home, weight 154 lbs.  Echo in 2/25 showed EF 25% with mild RV dysfunction and mild RV enlargement, PASP 60 mmHg, moderate MR.   He was seen again in the ER 03/14/23 with worsening fatigue and dyspnea.  Torsemide  was increased to 40 qam/20 qpm.   Patient was set up for RHC in 3/25 due to ongoing severe fatigue. This showed low CI 2.02 by thermodilution as well as moderate pulmonary arterial hypertension, he was admitted and started on milrinone  gtt.  He was gradually titrated off milrinone  and maintained a good co-ox off milrinone .  He had a moderate right pleural effusion, underwent right  thoracentesis.  Fluid was transudative but grew E coli, he was treated with Bactrim  based on sensitivities.  Given early satiety and poor appetite, he had an EGD that showed gastritic, biopsies showed no H pylori or gastric cancer.  HRCT chest showed bibasilar bronchiectasis, ?aspiration. However, swallow study was not suggestive of aspiration. V/Q scan was done due to pulmonary hypertension, this showed no chronic PE.  He was started on sildenafil  and meds were titrated, he felt better when he went home.   Echo in 3/25 showed EF 25-30%, mild RV dysfunction, moderate MR, PASP 62 mmHg.   Biotronik ICD placed in 5/25.  Patient was admitted again in 6/25 with dyspnea and fatigue.  BP was low, midodrine  was started and spironolactone  and tadalafil  were stopped.  RHC again showed mixed pulmonary arterial/venous hypertension and low CI at 2.1 by Fick and thermodilution.  Echo showed EF 20-25%, mild LV dilation, moderate RV dysfunction, normal RV size, PASP 44 mmHg, mild-moderate MR.   Patient returns for followup of CHF. Weight is stable.  He is doing better since hospitalization. Able to walk for about 15 minutes on flat ground.  Still dyspneic with stairs.  Occasional nausea still and poor appetite/early satiety.  Fatigues easily.  No orthopnea/PND.  No chest pain.  Taking midodrine  regularly, not having dizzy spells.    ECG (personally reviewed): NSR, RBBB, old ASMI  Labs (12/24): hgb 15.1, BNP 350, K 4.4, creatinine 1.24 Labs (2/25): K 4.1, creatinine 1.48, BNP 481, LFTs normal, free T3/T4 normal Labs (3/25): K 3.5, creatinine  1.44 => 1.27, hgb 13.4, BNP 812, HS-TnI 44 => 39 Labs (6/25): K 4, creatinine 1.39  SH: No ETOH or tobacco use.  Lives at home in [place] with his wife and children. Originally from [place].   PMH: 1. Chronic systolic CHF: Ischemic cardiomyopathy.  Biotronik ICD.  - Echo (12/24): EF 20-25%, LAD territory WMAs, moderate LVH, moderately decreased RV systolic function, mild  MR.  - Echo (2/25): EF 25% with mild RV dysfunction and mild RV enlargement, PASP 60 mmHg, moderate MR.  - RHC (3/25): mean RA 2, PA 60/18 mean 31, mean PCWP 17, CI 2.02 thermo, CI 2.52 Fick, PAPi 21, PVR 3.7 WU.  - Echo (3/25): EF 25-30%, mild RV dysfunction, moderate MR, PASP 62 mmHg.  - Echo (6/25): EF 20-25%, mild LV dilation, moderate RV dysfunction, normal RV size, PASP 44 mmHg, mild-moderate MR. - RHC (6/25): mean RA 5, PA 62/21 mean 35, mean PCWP 23, CI 2.1 by Fick and thermo, PVR 3.0 WU.  2. CAD: Inferior STEMI 10/23 with DES to RCA.  - Anterior STEMI (11/24): Occluded proximal LAD treated with DES, occluded D2, 90% OM1, 95% distal LCx.  Distal LCx and OM1 treated medically.  3. Atrial fibrillation: Paroxysmal.  - DCCV in 12/24.  4. H pylori gastritis 5. Hyperlipidemia 6. Type 2 diabetes 7. Pulmonary hypertension: RHC (3/25) with mean RA 2, PA 60/18 mean 31, mean PCWP 17, CI 2.02 thermo, CI 2.52 Fick, PAPi 21, PVR 3.7 WU. - V/Q scan (3/25): No chronic PE.  - HRCT chest (3/25): bibasilar bronchiectasis, ?aspiration.  - ANA, RF, anti-SCL70 Ab, anti-centromere Ab all negative.  8. Pleural effusion: On right, s/p thoracentesis.   Current Outpatient Medications  Medication Sig Dispense Refill   amiodarone  (PACERONE ) 200 MG tablet Take 1 tablet (200 mg total) by mouth daily. 30 tablet 5   apixaban  (ELIQUIS ) 5 MG TABS tablet Take 1 tablet (5 mg total) by mouth 2 (two) times daily.     clopidogrel  (PLAVIX ) 75 MG tablet Take 1 tablet (75 mg total) by mouth daily. 90 tablet 3   digoxin  (LANOXIN ) 0.125 MG tablet Take 1 tablet (0.125 mg total) by mouth daily. 90 tablet 3   empagliflozin  (JARDIANCE ) 10 MG TABS tablet Take 1 tablet (10 mg total) by mouth daily before breakfast.     ezetimibe  (ZETIA ) 10 MG tablet Take 1 tablet (10 mg total) by mouth daily. 90 tablet 3   levothyroxine  (SYNTHROID ) 88 MCG tablet Take 1 tablet (88 mcg total) by mouth daily. 90 tablet 0   linaclotide  (LINZESS ) 145  MCG CAPS capsule Take 1 capsule (145 mcg total) by mouth daily before breakfast.     midodrine  (PROAMATINE ) 5 MG tablet Take 1 tablet (5 mg total) by mouth 3 (three) times daily with meals. 90 tablet 0   pantoprazole  (PROTONIX ) 40 MG tablet Take 1 tablet (40 mg total) by mouth 2 (two) times daily before a meal. 180 tablet 3   potassium chloride  SA (KLOR-CON  M) 20 MEQ tablet Take 2 tablets (40 mEq total) by mouth daily. 90 tablet 3   rosuvastatin  (CRESTOR ) 40 MG tablet Take 40 mg by mouth daily.     tadalafil  (CIALIS ) 20 MG tablet Take 1 tablet (20 mg total) by mouth daily. 90 tablet 3   torsemide  (DEMADEX ) 20 MG tablet Take 2 tablets (40 mg total) by mouth daily. 180 tablet 0   Vitamin D-Vitamin K (VITAMIN K2-VITAMIN D3 PO) Take 1 tablet by mouth daily with lunch.  acetaminophen  (TYLENOL ) 325 MG tablet Take 1-2 tablets (325-650 mg total) by mouth every 4 (four) hours as needed for mild pain (pain score 1-3). (Patient not taking: Reported on 07/19/2023)     melatonin 5 MG TABS Take 5 mg by mouth at bedtime as needed (sleep). (Patient not taking: Reported on 07/19/2023)     No current facility-administered medications for this encounter.   Allergies  Allergen Reactions   Beef-Derived Drug Products    Pork-Derived Products Other (See Comments)    Religious observance   Family History  Problem Relation Age of Onset   Diabetes Mother    Chronic Renal Failure Father    Diabetes Father    Chronic Renal Failure Brother    Diabetes Brother    Diabetes Brother    Heart attack Brother    Stroke Brother    Wt Readings from Last 3 Encounters:  07/19/23 64.3 kg (141 lb 12.8 oz)  07/12/23 63 kg (139 lb)  07/12/23 63.3 kg (139 lb 9.6 oz)   BP 105/70 Comment: map 82  Pulse 73   Wt 64.3 kg (141 lb 12.8 oz)   SpO2 96%   BMI 19.23 kg/m   PHYSICAL EXAM: General: NAD Neck: JVP 8 cm, no thyromegaly or thyroid  nodule.  Lungs: Clear to auscultation bilaterally with normal respiratory effort. CV:  Lateral PMI.  Heart regular S1/S2 with widely split S2, no S3/S4, no murmur.  No peripheral edema.  No carotid bruit.  Normal pedal pulses.  Abdomen: Soft, nontender, no hepatosplenomegaly, no distention.  Skin: Intact without lesions or rashes.  Neurologic: Alert and oriented x 3.  Psych: Normal affect. Extremities: No clubbing or cyanosis.  HEENT: Normal.   ASSESSMENT & PLAN: 1. Chronic systolic CHF/ischemic cardiomyopathy:  Biotronik ICD.  Echo 12/24 with EF 20-25%, no LV thrombus, AK mid septum to apex, moderately reduced RV systolic function. Echo 2/25 with EF 25%, mildly reduced RV function.  Low BP has limited GDMT titration.  His profound fatigue has been concerning for low output HF. RHC 3/25 showed low CI by thermo (2.05) but preserved by Fick.  Borderline elevated PCWP, normal RA pressure.  There was also moderate mixed pulmonary arterial/pulmonary venous hypertension.  With his significant symptoms, he was started on milrinone  0.25 and added sildenafil  to try to lower PA pressure.  Milrinone  was later weaned off with medication adjustment, and co-ox remained excellent.  He was readmitted in 6/25 with CHF; RHC showed moderate mixed pulmonary arterial/pulmonary venous hypertension with CI low at 2.1 by Fick and thermo.  Echo in 6/25 showed EF 20-25%, mild LV dilation, moderate RV dysfunction, normal RV size, PASP 44 mmHg, mild-moderate MR.  He is symptomatically improved since hospitalization, now NYHA class III.  We have had to peel back on GDMT due to low BP and he is now taking midodrine .  Creatinine has trended up a bit to 1.39 most recently.  He is not volume overloaded on exam.  - With low output heart failure (CI 2.1 and inability to tolerate GDMT - now on midodrine ), we have been discussing advanced therapies.  He is not interested in transplant and at his age, it would be more difficult to obtain. I think he would be a good LVAD candidate. We have a window at this time while his RV  function is not severely depressed and his renal function is relatively preserved.  He has completed workup.  He is interested in LVAD but wants to take a trip this summer to  see his 42 year old mother in [place].  I recommended that he make the trip relatively short and make sure he has all his meds with him. We will see him on return will talk with him about a surgical date for LVAD placement.  - Continue torsemide  40 mg daily, BMET/BNP today.  - Continue Jardiance  10 mg daily. - Continue digoxin  0.125 daily.  - He is on midodrine  5 mg tid to maintain MAP.   - off Toprol  XL with low output.  - Off spironolactone  with low BP.  2. CAD:  Hx inferior STEMI 10/23 s/p PCI/DES to RCA. Anterior STEMI 12/10/22, cath showed occluded pLAD treated with PCI/DES, prior RCA stent patent, severe disease OM1 and distal LCx treated medically. No chest pain.  - Continue Plavix .   - No ASA given Eliquis  use.  - Continue rosuvastatin  40 mg + Zetia  10 mg daily. 3. Atrial fibrillation: Paroxysmal. NSR today.  - continue Eliquis  5 mg bid  - Continue PO amiodarone  200 mg daily, check LFTs and TSH today.  He will need a regular eye exam.  4. DM II: Continue Jardiance  + SSI  5. 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 Not a smoker, no known lung disease. CXR has shown some chronic interstitial changes. HRCT chest with bibasilar bronchiectasis that may be due to aspiration (though swallow study negative).  V/Q scan showed no chronic PE. Rheumatologic serologies negative - Restart tadalafil  20 mg daily.   6. Pleural effusion: Moderate on right. S/p right thoracentesis 3/25. Pleural fluid was transudative by Light's criteria, no malignant cells. However, it grew E coli that was treated with antibiotics.   7. GI: History of early satiety, GI discomfort.  CT abdomen/pelvis with gastric wall thickening. GI was consulted and EGD was performed, no definite evidence for malignancy,  biopsies taken for H pylori testing and pathology => negative for H pylori and no gastric cancer. Heart failure may be the cause of his GI symptoms.   Followup 1 month after he returns from trip and we will discuss LVAD scheduling.   I spent 42 minutes reviewing records, interviewing/examining patient, and managing orders.    Ezra Shuck 07/19/23

## 2023-07-19 NOTE — Progress Notes (Signed)
 Patient presents for hospital d/c f/u in VAD Clinic today. Recent hospitalization with initiation of VAD evaluation.    Pt ambulated into clinic today. States he is feeling much better since hospital admission. Denies lightheadedness, dizziness, falls, shortness of breath, chest pain, heart failure symptoms, and signs of bleeding. States he is able to walk 15 - 20 minutes without stopping. States he is able to go upstairs at his house, when previously he was unable to. Appetite is still poor, but he is experiencing less nausea. Weight is stable today.   Per Dr Rolan- restart Tadalafil  20 mg daily. Prescription sent to pt's pharmacy. Pt verbalized understanding to restarting medication.   EKG obtained and reviewed with Dr Rolan.   Lengthy discussion regarding VAD implant with pt and Dr Rolan. Pt approved for [facility] implant at [facility] this morning. Pt confirmed he is not interested in transplant. All questions answered regarding implant. He would like to travel to [place] in the next few weeks to visit his mother before having surgery. Pt will confirm travel dates and call VAD coordinator with plans. Will schedule close follow up in VAD clinic upon return to [place]  for decision regarding timing of VAD implant. Requested pt bring family to next appt. Will arrange for a VAD pt to meet with pt and family at that visit. Pt verbalized understanding.    Vital Signs:  HR: 73 BP: 105/70 (82) SPO2: 96%   Weight: 141.8 lb  Last weight: 141.7 lb   Symptom YES NO DETAILS  Angina  x Activity:  Claudication  x How Far:  Syncope  x When:  Stroke  x   Orthopnea  x How many pillows:  PND  x How often:  CPAP  x How many hours:  Pedal Edema  x   Abdominal Fullness  x   Nausea / Vomit x  Nausea once a week. Continue with poor appetite  Diaphoresis  x When:  Shortness of Breath  x Activity:  Palpitations  x When:  ICD shock  x   Bleeding S/S  x   Tea-colored Urine  x   Hospitalizations  x   Emergency Room  x    Other MD     Activity Increased tolerance of exercise 15 - 20 minutes daily  Fluid   Diet     Device: Biotronik Therapies: on Last check: implanted 05/2023   Patient Instructions:  Start Tadalafil  20 mg daily Please call VAD coordinators once you have your travel dates. We will schedule you a follow up appt for when you return. [phone]   Isaiah Knoll RN VAD Coordinator  Office: [phone]  24/7 Pager: [phone]

## 2023-07-20 LAB — CUP PACEART REMOTE DEVICE CHECK
Date Time Interrogation Session: 20250709082119
Implantable Lead Connection Status: 753985
Implantable Lead Implant Date: 20250522
Implantable Lead Location: 753860
Implantable Lead Serial Number: 81628712
Implantable Pulse Generator Implant Date: 20250522
Pulse Gen Model: 429525
Pulse Gen Serial Number: 88734280

## 2023-07-22 DIAGNOSIS — Z419 Encounter for procedure for purposes other than remedying health state, unspecified: Secondary | ICD-10-CM | POA: Diagnosis not present

## 2023-07-24 ENCOUNTER — Telehealth (HOSPITAL_COMMUNITY): Payer: Self-pay | Admitting: Cardiology

## 2023-07-24 NOTE — Telephone Encounter (Signed)
 Called to confirm/remind patient of their appointment at the [facility] Clinic on 07/24/2023.   Appointment:   [x] Confirmed  [] Left mess   [] No answer/No voice mail  [] VM Full/unable to leave message  [] Phone not in service  Patient reminded to bring all medications and/or complete list.  Confirmed patient has transportation. Gave directions, instructed to utilize valet parking.

## 2023-07-25 ENCOUNTER — Encounter (HOSPITAL_COMMUNITY): Admitting: Cardiology

## 2023-07-28 ENCOUNTER — Other Ambulatory Visit: Payer: Self-pay | Admitting: General Practice

## 2023-07-28 DIAGNOSIS — E038 Other specified hypothyroidism: Secondary | ICD-10-CM

## 2023-07-29 ENCOUNTER — Other Ambulatory Visit (HOSPITAL_COMMUNITY): Payer: Self-pay

## 2023-07-30 ENCOUNTER — Ambulatory Visit: Payer: Self-pay | Admitting: Cardiology

## 2023-07-31 ENCOUNTER — Other Ambulatory Visit (HOSPITAL_COMMUNITY): Payer: Self-pay

## 2023-08-07 ENCOUNTER — Other Ambulatory Visit: Payer: Self-pay | Admitting: General Practice

## 2023-08-07 ENCOUNTER — Other Ambulatory Visit: Payer: Self-pay

## 2023-08-07 ENCOUNTER — Other Ambulatory Visit (HOSPITAL_COMMUNITY): Payer: Self-pay

## 2023-08-07 DIAGNOSIS — E038 Other specified hypothyroidism: Secondary | ICD-10-CM

## 2023-08-07 MED ORDER — LEVOTHYROXINE SODIUM 88 MCG PO TABS
88.0000 ug | ORAL_TABLET | Freq: Every day | ORAL | 0 refills | Status: DC
Start: 2023-08-07 — End: 2023-08-23
  Filled 2023-08-07: qty 30, 30d supply, fill #0

## 2023-08-16 ENCOUNTER — Other Ambulatory Visit (HOSPITAL_COMMUNITY): Payer: Self-pay

## 2023-08-17 ENCOUNTER — Ambulatory Visit (INDEPENDENT_AMBULATORY_CARE_PROVIDER_SITE_OTHER): Admitting: "Endocrinology

## 2023-08-17 ENCOUNTER — Encounter: Payer: Self-pay | Admitting: "Endocrinology

## 2023-08-17 VITALS — BP 112/80 | HR 75 | Ht 72.0 in | Wt 142.0 lb

## 2023-08-17 DIAGNOSIS — E039 Hypothyroidism, unspecified: Secondary | ICD-10-CM

## 2023-08-17 NOTE — Progress Notes (Signed)
 Outpatient Endocrinology Note Garrett Birmingham, MD  Roberts   Garrett Roberts 10/06/1955 [id number]  Referring Provider: Kennyth Chew, MD Primary Care Provider: Vincente Shivers, NP Subjective  No chief complaint on file.   Assessment & Plan  Diagnoses and all orders for this visit:  Acquired hypothyroidism -     TSH + free T4 -     US  THYROID ; Future -     US  THYROID    Garrett Roberts is currently taking levothyroxine  88 mcg po qd. Patient is currently biochemically hypothyroid.  Educated on thyroid  axis.  Recommend the following: Take levothyroxine  88mcg every morning. Lab today. Advised to take levothyroxine  first thing in the morning on empty stomach and wait at least 30 minutes to 1 hour before eating or drinking anything or taking any other medications. Space out levothyroxine  by 4 hours from any acid reflux medication/fibrate/iron/calcium /multivitamin. Advised to take birth control pills and nutritional supplements in the evening. Repeat lab before next visit or sooner if symptoms of hyperthyroidism or hypothyroidism develop.  Notify us  immediately in case of pregnancy/breastfeeding or significant weight gain or loss. Counseled on compliance and follow up needs.   No baseline thyroid  ultrasound ?thryodi nodule on exam Ordered thyroid  U/S  I have reviewed current medications, nurse's notes, allergies, vital signs, past medical and surgical history, family medical history, and social history for this encounter. Counseled patient on symptoms, examination findings, lab findings, imaging results, treatment decisions and monitoring and prognosis. The patient understood the recommendations and agrees with the treatment plan. All questions regarding treatment plan were fully answered.   Return in about 3 months (around 11/17/2023) for visit, labs today.   Garrett Birmingham, MD  Roberts   I have reviewed current medications, nurse's notes, allergies, vital  signs, past medical and surgical history, family medical history, and social history for this encounter. Counseled patient on symptoms, examination findings, lab findings, imaging results, treatment decisions and monitoring and prognosis. The patient understood the recommendations and agrees with the treatment plan. All questions regarding treatment plan were fully answered.   History of Present Illness Garrett Roberts is a 68 y.o. year old male who presents to our clinic with hypothyroidism diagnosed in 2024?SABRA    Symptoms suggestive of HYPOTHYROIDISM:  fatigue Yes weight gain No cold intolerance  No constipation  Yes  Symptoms suggestive of HYPERTHYROIDISM:  weight loss  Yes heat intolerance No hyperdefecation  No palpitations  No  Compressive symptoms:  dysphagia  No dysphonia  No positional dyspnea (especially with simultaneous arms elevation)  No  Smokes  No On biotin  No Personal history of head/neck surgery/irradiation  No  Physical Exam  BP 112/80   Pulse 75   Ht 6' (1.829 m)   Wt 142 lb (64.4 kg)   SpO2 98%   BMI 19.26 kg/m  Constitutional: well developed, well nourished Head: normocephalic, atraumatic, no exophthalmos Eyes: sclera anicteric, no redness Neck: no thyromegaly, no thyroid  tenderness; ? nodule palpated Lungs: normal respiratory effort Neurology: alert and oriented Skin: dry, no appreciable rashes Musculoskeletal: no appreciable defects Psychiatric: normal mood and affect  Allergies Allergies  Allergen Reactions   Beef-Derived Drug Products    Pork-Derived Products Other (See Comments)    Religious observance    Current Medications Patient's Medications  New Prescriptions   No medications on file  Previous Medications   ACETAMINOPHEN  (TYLENOL ) 325 MG TABLET    Take 1-2 tablets (325-650 mg total) by mouth every 4 (four) hours as needed for mild pain (  pain score 1-3).   AMIODARONE  (PACERONE ) 200 MG TABLET    Take 1 tablet (200 mg  total) by mouth daily.   APIXABAN  (ELIQUIS ) 5 MG TABS TABLET    Take 1 tablet (5 mg total) by mouth 2 (two) times daily.   CLOPIDOGREL  (PLAVIX ) 75 MG TABLET    Take 1 tablet (75 mg total) by mouth daily.   DIGOXIN  (LANOXIN ) 0.125 MG TABLET    Take 1 tablet (0.125 mg total) by mouth daily.   EMPAGLIFLOZIN  (JARDIANCE ) 10 MG TABS TABLET    Take 1 tablet (10 mg total) by mouth daily before breakfast.   EZETIMIBE  (ZETIA ) 10 MG TABLET    Take 1 tablet (10 mg total) by mouth daily.   LEVOTHYROXINE  (SYNTHROID ) 88 MCG TABLET    Take 1 tablet (88 mcg total) by mouth daily.   LINACLOTIDE  (LINZESS ) 145 MCG CAPS CAPSULE    Take 1 capsule (145 mcg total) by mouth daily before breakfast.   MELATONIN 5 MG TABS    Take 5 mg by mouth at bedtime as needed (sleep).   MIDODRINE  (PROAMATINE ) 5 MG TABLET    Take 1 tablet (5 mg total) by mouth 3 (three) times daily with meals.   PANTOPRAZOLE  (PROTONIX ) 40 MG TABLET    Take 1 tablet (40 mg total) by mouth 2 (two) times daily before a meal.   POTASSIUM CHLORIDE  SA (KLOR-CON  M) 20 MEQ TABLET    Take 2 tablets (40 mEq total) by mouth daily.   ROSUVASTATIN  (CRESTOR ) 40 MG TABLET    Take 40 mg by mouth daily.   TADALAFIL  (CIALIS ) 20 MG TABLET    Take 1 tablet (20 mg total) by mouth daily.   TORSEMIDE  (DEMADEX ) 20 MG TABLET    Take 2 tablets (40 mg total) by mouth daily.   VITAMIN D-VITAMIN K (VITAMIN K2-VITAMIN D3 PO)    Take 1 tablet by mouth daily with lunch.  Modified Medications   No medications on file  Discontinued Medications   No medications on file    Past Medical History Past Medical History:  Diagnosis Date   Acute ST elevation myocardial infarction (STEMI) of anterior wall (HCC) 12/10/2022   Acute ST elevation myocardial infarction (STEMI) of inferior wall (HCC) 10/30/2021   Atrial fibrillation (HCC)    Atrial fibrillation with RVR (HCC) 12/19/2022   CHF (congestive heart failure) (HCC)    Coronary artery disease    Diabetes mellitus without  complication (HCC)    DM (diabetes mellitus) (HCC)    H. pylori infection    Heart attack (HCC)    HLD (hyperlipidemia)    Hypertension    ST elevation myocardial infarction involving left anterior descending (LAD) coronary artery (HCC) 12/10/2022    Past Surgical History Past Surgical History:  Procedure Laterality Date   CARDIOVERSION N/A 12/14/2022   Procedure: CARDIOVERSION;  Surgeon: Kate Lonni CROME, MD;  Location: [facility] INVASIVE CV LAB;  Service: Cardiovascular;  Laterality: N/A;   CORONARY/GRAFT ACUTE MI REVASCULARIZATION N/A 10/30/2021   Procedure: Coronary/Graft Acute MI Revascularization;  Surgeon: Ladona Heinz, MD;  Location: [facility] INVASIVE CV LAB;  Service: Cardiovascular;  Laterality: N/A;   CORONARY/GRAFT ACUTE MI REVASCULARIZATION N/A 12/10/2022   Procedure: Coronary/Graft Acute MI Revascularization;  Surgeon: Wonda Sharper, MD;  Location: [facility] INVASIVE CV LAB;  Service: Cardiovascular;  Laterality: N/A;   ESOPHAGOGASTRODUODENOSCOPY N/A 03/23/2023   Procedure: EGD (ESOPHAGOGASTRODUODENOSCOPY);  Surgeon: Stacia Glendia BRAVO, MD;  Location: [facility] ENDOSCOPY;  Service: Gastroenterology;  Laterality: N/A;   ICD IMPLANT  N/A 06/01/2023   Procedure: ICD IMPLANT;  Surgeon: Kennyth Chew, MD;  Location: [facility] INVASIVE CV LAB;  Service: Cardiovascular;  Laterality: N/A;   LEFT HEART CATH AND CORONARY ANGIOGRAPHY N/A 10/30/2021   Procedure: LEFT HEART CATH AND CORONARY ANGIOGRAPHY;  Surgeon: Ladona Heinz, MD;  Location: [facility] INVASIVE CV LAB;  Service: Cardiovascular;  Laterality: N/A;   LEFT HEART CATH AND CORONARY ANGIOGRAPHY N/A 12/10/2022   Procedure: LEFT HEART CATH AND CORONARY ANGIOGRAPHY;  Surgeon: Wonda Sharper, MD;  Location: [facility] INVASIVE CV LAB;  Service: Cardiovascular;  Laterality: N/A;   RIGHT HEART CATH N/A 03/20/2023   Procedure: RIGHT HEART CATH;  Surgeon: Rolan Ezra RAMAN, MD;  Location: [facility] INVASIVE CV LAB;  Service: Cardiovascular;  Laterality: N/A;   RIGHT HEART CATH N/A  06/30/2023   Procedure: RIGHT HEART CATH;  Surgeon: Cherrie Toribio SAUNDERS, MD;  Location: [facility] INVASIVE CV LAB;  Service: Cardiovascular;  Laterality: N/A;    Family History family history includes Chronic Renal Failure in his brother and father; Diabetes in his brother, brother, father, and mother; Heart attack in his brother; Stroke in his brother.  Social History Social History   Socioeconomic History   Marital status: Married    Spouse name: Amani   Number of children: 5   Years of education: Not on file   Highest education level: High school graduate  Occupational History   Occupation: part time  Tobacco Use   Smoking status: Never   Smokeless tobacco: Never  Vaping Use   Vaping status: Never Used  Substance and Sexual Activity   Alcohol use: No   Drug use: No   Sexual activity: Not on file  Other Topics Concern   Not on file  Social History Narrative   ** Merged History Encounter **       Social Drivers of Health   Financial Resource Strain: Low Risk  (07/12/2023)   Overall Financial Resource Strain (CARDIA)    Difficulty of Paying Living Expenses: Not hard at all  Food Insecurity: No Food Insecurity (07/12/2023)   Hunger Vital Sign    Worried About Running Out of Food in the Last Year: Never true    Ran Out of Food in the Last Year: Never true  Transportation Needs: No Transportation Needs (07/12/2023)   PRAPARE - Administrator, Civil Service (Medical): No    Lack of Transportation (Non-Medical): No  Physical Activity: Sufficiently Active (07/12/2023)   Exercise Vital Sign    Days of Exercise per Week: 5 days    Minutes of Exercise per Session: 40 min  Stress: No Stress Concern Present (07/12/2023)   [employer] of Occupational Health - Occupational Stress Questionnaire    Feeling of Stress: Not at all  Social Connections: Moderately Integrated (07/12/2023)   Social Connection and Isolation Panel    Frequency of Communication with Friends and Family:  More than three times a week    Frequency of Social Gatherings with Friends and Family: More than three times a week    Attends Religious Services: More than 4 times per year    Active Member of [employer] or Organizations: No    Attends Banker Meetings: Never    Marital Status: Married  Catering manager Violence: Not At Risk (07/12/2023)   Humiliation, Afraid, Rape, and Kick questionnaire    Fear of Current or Ex-Partner: No    Emotionally Abused: No    Physically Abused: No    Sexually Abused: No  Laboratory Investigations Lab Results  Component Value Date   TSH 9.220 (H) 07/19/2023   TSH 14.882 (H) 07/05/2023   TSH 22.402 (H) 06/13/2023   FREET4 1.03 07/05/2023   FREET4 0.71 06/13/2023   FREET4 1.10 02/27/2023     No results found for: TSI   No components found for: TRAB   Lab Results  Component Value Date   CHOL 115 07/04/2023   Lab Results  Component Value Date   HDL 46 07/04/2023   Lab Results  Component Value Date   LDLCALC 58 07/04/2023   Lab Results  Component Value Date   TRIG 55 07/04/2023   Lab Results  Component Value Date   CHOLHDL 2.5 07/04/2023   Lab Results  Component Value Date   CREATININE 1.46 (H) 07/19/2023   Lab Results  Component Value Date   GFR 52.40 (L) 07/06/2023      Component Value Date/Time   NA 136 07/19/2023 1333   NA 140 05/29/2023 1142   K 4.1 07/19/2023 1333   CL 99 07/19/2023 1333   CO2 27 07/19/2023 1333   GLUCOSE 227 (H) 07/19/2023 1333   BUN 22 07/19/2023 1333   BUN 14 05/29/2023 1142   CREATININE 1.46 (H) 07/19/2023 1333   CALCIUM  9.4 07/19/2023 1333   PROT 7.4 07/19/2023 1333   ALBUMIN  3.7 07/19/2023 1333   AST 27 07/19/2023 1333   ALT 25 07/19/2023 1333   ALKPHOS 75 07/19/2023 1333   BILITOT 0.8 07/19/2023 1333   GFRNONAA 52 (L) 07/19/2023 1333   GFRAA >90 09/25/2012 1823      Latest Ref Rng & Units 07/19/2023    1:33 PM 07/06/2023   12:26 PM 07/05/2023    2:48 AM  BMP  Glucose 70 - 99  mg/dL 772  850  876   BUN 8 - 23 mg/dL 22  28  30    Creatinine 0.61 - 1.24 mg/dL 8.53  8.60  8.60   Sodium 135 - 145 mmol/L 136  136  137   Potassium 3.5 - 5.1 mmol/L 4.1  4.0  4.3   Chloride 98 - 111 mmol/L 99  98  101   CO2 22 - 32 mmol/L 27  31  22    Calcium  8.9 - 10.3 mg/dL 9.4  9.6  9.2        Component Value Date/Time   WBC 5.1 07/06/2023 1226   RBC 4.89 07/06/2023 1226   HGB 13.9 07/06/2023 1226   HGB 13.8 05/29/2023 1142   HCT 43.1 07/06/2023 1226   HCT 42.4 05/29/2023 1142   PLT 237.0 07/06/2023 1226   PLT 236 05/29/2023 1142   MCV 88.1 07/06/2023 1226   MCV 90 05/29/2023 1142   MCH 28.6 07/05/2023 0248   MCHC 32.3 07/06/2023 1226   RDW 15.9 (H) 07/06/2023 1226   RDW 14.4 05/29/2023 1142   LYMPHSABS 2.3 05/29/2023 1142   MONOABS 1.0 03/20/2023 1638   EOSABS 0.2 05/29/2023 1142   BASOSABS 0.1 05/29/2023 1142      Parts of this note may have been dictated using voice recognition software. There may be variances in spelling and vocabulary which are unintentional. Not all errors are proofread. Please notify the dino if any discrepancies are noted or if the meaning of any statement is not clear.

## 2023-08-18 ENCOUNTER — Ambulatory Visit: Admitting: Physician Assistant

## 2023-08-18 ENCOUNTER — Encounter: Payer: Self-pay | Admitting: Physician Assistant

## 2023-08-18 VITALS — BP 90/60 | HR 77 | Ht 72.0 in | Wt 144.0 lb

## 2023-08-18 DIAGNOSIS — K5900 Constipation, unspecified: Secondary | ICD-10-CM

## 2023-08-18 DIAGNOSIS — Z7901 Long term (current) use of anticoagulants: Secondary | ICD-10-CM | POA: Diagnosis not present

## 2023-08-18 DIAGNOSIS — I4891 Unspecified atrial fibrillation: Secondary | ICD-10-CM

## 2023-08-18 MED ORDER — LINACLOTIDE 145 MCG PO CAPS: 145.0000 ug | ORAL_CAPSULE | Freq: Every day | ORAL | 3 refills | Status: DC

## 2023-08-18 NOTE — Patient Instructions (Signed)
 Linzess works best when taken once a day every day, on an empty stomach, at least 30 minutes before your first meal of the day.  When Linzess is taken daily as directed:  *Constipation relief is typically felt in about a week *IBS-C patients may begin to experience relief from belly pain and overall abdominal symptoms (pain, discomfort, and bloating) in about 1 week,   with symptoms typically improving over 12 weeks.  Diarrhea may occur in the first 2 weeks -keep taking it.  The diarrhea should go away and you should start having normal, complete, full bowel movements. It may be helpful to start treatment when you can be near the comfort of your own bathroom, such as a weekend.

## 2023-08-18 NOTE — Progress Notes (Signed)
 08/18/2023 Garrett Roberts [id number] 1955-03-31  Referring provider: Vincente Shivers, NP Primary GI doctor: Dr. Federico  ASSESSMENT AND PLAN:  Constipation 10/282015 colonoscopy at [facility] for abdominal discomfort and lower abdominal pain good preparation completely unremarkable recall 10 years  CT abdomen pelvis March 2025 in the hospital Status post MiraLAX  purge 04/14/2023 then MiraLAX  daily Continuing constipation, has not had BM in 4 days He is not on miralax  daily, has dicomfort, bloating No melena, no hematochezia Constipation likely due to medications and inactivity. Previous imaging showed moderate stool load. Symptoms persist without daily medication. No alarming findings on CT and x-ray. - Prescribed Linzess  145 mcg daily. - Provided Linzess  145 mcg samples. - Follow-up in October to assess Linzess  response. - Plan colonoscopy post-cardiology clearance for Plavix  hold, ideally one year post-stent in Nov/Dec - Instructed to report new symptoms like blood in stool.  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 pylori infection stool test treated with quadruple therapy 03/23/2023 EGD Dr. Stacia for abnormal CT showed normal esophagus, 4 cm HH, granular gastric mucosa normal duodenum no evidence of malignancy.   Path negative H. pylori negative dysplasia and malignancy did show mild chronic gastritis and focal intestinal metaplasia, no recommendation for routine surveillance Office visit 04/2023 started on mirtazapine  50 mg at bedtime Continue pantoprazole  40 mg twice daily No plan to repeat EGD  CAD  10/2021 status post PCI RCA STEMI 12/10/22. Cardiac cath with 100% proximal LAD treated with PCI/DES. Prior RCA stent patent, severe  disease in tortuous ramus and distal LCx treated medically.  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  S/p Biotronik ICD 5/25.  Transudative pleural effusion 03/2023 He had a moderate right pleural effusion, underwent right thoracentesis. Fluid was transudative but grew E coli, he was treated with Bactrim  based on sensitivities.    Patient Care Team: Vincente Shivers, NP as PCP - General (General Practice) Elmira Newman PARAS, MD as PCP - Cardiology (Cardiology) Kennyth Chew, MD as PCP - Electrophysiology (Cardiology)  HISTORY OF PRESENT ILLNESS: 68 y.o. male with a past medical history coronary artery disease status post non-STEMI 2023 PCI DES to RCA, heart failure ejection fraction 40 to 45%, diabetes hypertension and others listed below presents for hospital follow-up 3/10-3/14/2025 for abnormal CT.  Discussed the use of AI scribe software for clinical note transcription with the patient, who gave verbal consent to proceed.  History of Present Illness   Garrett Roberts is a 68 year old male with heart failure who presents with constipation.  He experiences ongoing constipation, which he attributes to his medication regimen. He uses Linzess  intermittently, preferring to use it only when necessary rather than daily. He desires to have normal bowel movements without medication. His constipation affects his appetite, as he does not feel like eating when constipated. He has tried both 145 mcg and 290 mcg doses of Linzess , finding the lower dose to be effective without causing diarrhea.  He has a history of heart failure and was hospitalized for it. He is currently  on multiple medications,  including amiodarone , digoxin , torsemide , and Jardiance . He also takes mirtazapine  50 mg at bedtime, which he uses every night, though he reports no significant change in appetite or sleep.  He reports occasional stomach pain, primarily in the left lower quadrant. No blood in the stool. A previous x-ray in June showed moderate stool throughout the colon.  He has not had a colonoscopy in ten years since his last one.      Wt Readings from Last 20 Encounters:  08/18/23 144 lb (65.3 kg)  08/17/23 142 lb (64.4 kg)  07/19/23 141 lb 12.8 oz (64.3 kg)  07/12/23 139 lb (63 kg)  07/12/23 139 lb 9.6 oz (63.3 kg)  07/06/23 143 lb (64.9 kg)  07/05/23 141 lb 11.2 oz (64.3 kg)  06/16/23 150 lb (68 kg)  06/13/23 148 lb (67.1 kg)  06/12/23 145 lb (65.8 kg)  06/01/23 142 lb 3.2 oz (64.5 kg)  05/23/23 148 lb 9.6 oz (67.4 kg)  05/02/23 152 lb 12.8 oz (69.3 kg)  04/21/23 147 lb 3.2 oz (66.8 kg)  04/14/23 146 lb (66.2 kg)  04/12/23 148 lb (67.1 kg)  04/05/23 146 lb (66.2 kg)  03/30/23 148 lb 9.6 oz (67.4 kg)  03/24/23 144 lb 13.5 oz (65.7 kg)  03/16/23 151 lb (68.5 kg)     He  reports that he has never smoked. He has never used smokeless tobacco. He reports that he does not drink alcohol and does not use drugs.  RELEVANT GI HISTORY, IMAGING AND LABS: Results   RADIOLOGY Abdominal X-ray: Moderate amount of stool throughout colon (06/2023) Abdominal CT: No significant findings  DIAGNOSTIC Esophagogastroduodenoscopy (EGD): Negative for Helicobacter pylori      CBC    Component Value Date/Time   WBC 5.1 07/06/2023 1226   RBC 4.89 07/06/2023 1226   HGB 13.9 07/06/2023 1226   HGB 13.8 05/29/2023 1142   HCT 43.1 07/06/2023 1226   HCT 42.4 05/29/2023 1142   PLT 237.0 07/06/2023 1226   PLT 236 05/29/2023 1142   MCV 88.1 07/06/2023 1226   MCV 90 05/29/2023 1142   MCH 28.6 07/05/2023 0248   MCHC 32.3 07/06/2023 1226   RDW 15.9 (H) 07/06/2023 1226   RDW 14.4 05/29/2023 1142   LYMPHSABS 2.3  05/29/2023 1142   MONOABS 1.0 03/20/2023 1638   EOSABS 0.2 05/29/2023 1142   BASOSABS 0.1 05/29/2023 1142   Recent Labs    05/29/23 1142 06/30/23 0054 06/30/23 1419 06/30/23 1420 06/30/23 1424 07/02/23 0255 07/03/23 0308 07/04/23 0255 07/05/23 0248 07/06/23 1226  HGB 13.8 14.9 12.9* 15.6 15.6 14.0 13.5 14.1 14.4 13.9    CMP     Component Value Date/Time   NA 136 07/19/2023 1333   NA 140 05/29/2023 1142   K 4.1 07/19/2023 1333   CL 99 07/19/2023 1333   CO2 27 07/19/2023 1333   GLUCOSE 227 (H) 07/19/2023 1333   BUN 22 07/19/2023 1333   BUN 14 05/29/2023 1142   CREATININE 1.46 (H) 07/19/2023 1333   CALCIUM  9.4 07/19/2023 1333   PROT 7.4 07/19/2023 1333   ALBUMIN  3.7 07/19/2023 1333   AST 27 07/19/2023 1333   ALT 25 07/19/2023 1333   ALKPHOS 75 07/19/2023 1333   BILITOT 0.8 07/19/2023 1333   GFRNONAA 52 (L) 07/19/2023 1333   GFRAA >90 09/25/2012 1823      Latest Ref Rng & Units 07/19/2023    1:33 PM 07/04/2023    2:55 AM 07/03/2023    3:08 AM  Hepatic Function  Total Protein 6.5 - 8.1 g/dL 7.4  6.6  6.5   Albumin  3.5 - 5.0 g/dL 3.7  3.6  3.4   AST 15 - 41 U/L 27  27  35   ALT 0 - 44 U/L 25  28  29    Alk Phosphatase 38 - 126 U/L 75  68  76   Total Bilirubin 0.0 - 1.2 mg/dL 0.8  0.5  0.8       Current Medications:   Current Outpatient Medications (Endocrine & Metabolic):    empagliflozin  (JARDIANCE ) 10 MG TABS tablet, Take 1 tablet (10 mg total) by mouth daily before breakfast.   levothyroxine  (SYNTHROID ) 88 MCG tablet, Take 1 tablet (88 mcg total) by mouth daily.  Current Outpatient Medications (Cardiovascular):    amiodarone  (PACERONE ) 200 MG tablet, Take 1 tablet (200 mg total) by mouth daily.   digoxin  (LANOXIN ) 0.125 MG tablet, Take 1 tablet (0.125 mg total) by mouth daily.   ezetimibe  (ZETIA ) 10 MG tablet, Take 1 tablet (10 mg total) by mouth daily.   midodrine  (PROAMATINE ) 5 MG tablet, Take 1 tablet (5 mg total) by mouth 3 (three) times daily with  meals.   rosuvastatin  (CRESTOR ) 40 MG tablet, Take 40 mg by mouth daily.   tadalafil  (CIALIS ) 20 MG tablet, Take 1 tablet (20 mg total) by mouth daily.   torsemide  (DEMADEX ) 20 MG tablet, Take 2 tablets (40 mg total) by mouth daily.   Current Outpatient Medications (Analgesics):    acetaminophen  (TYLENOL ) 325 MG tablet, Take 1-2 tablets (325-650 mg total) by mouth every 4 (four) hours as needed for mild pain (pain score 1-3).  Current Outpatient Medications (Hematological):    apixaban  (ELIQUIS ) 5 MG TABS tablet, Take 1 tablet (5 mg total) by mouth 2 (two) times daily.   clopidogrel  (PLAVIX ) 75 MG tablet, Take 1 tablet (75 mg total) by mouth daily.  Current Outpatient Medications (Other):    melatonin 5 MG TABS, Take 5 mg by mouth at bedtime as needed (sleep).   pantoprazole  (PROTONIX ) 40 MG tablet, Take 1 tablet (40 mg total) by mouth 2 (two) times daily before a meal.   potassium chloride  SA (KLOR-CON  M) 20 MEQ tablet, Take 2 tablets (40 mEq total) by mouth daily.   Vitamin D-Vitamin K (VITAMIN K2-VITAMIN D3 PO), Take 1 tablet by mouth daily with lunch.   linaclotide  (LINZESS ) 145 MCG CAPS capsule, Take 1 capsule (145 mcg total) by mouth daily before breakfast.  Medical History:  Past Medical History:  Diagnosis Date   Acute ST elevation myocardial infarction (STEMI) of anterior wall (HCC) 12/10/2022   Acute ST elevation myocardial infarction (STEMI) of inferior wall (HCC) 10/30/2021   Atrial fibrillation (HCC)    Atrial fibrillation with RVR (HCC) 12/19/2022   CHF (congestive heart failure) (HCC)    Coronary artery disease    Diabetes mellitus without complication (HCC)    DM (diabetes mellitus) (HCC)    H. pylori infection    Heart attack (HCC)    HLD (hyperlipidemia)    Hypertension    ST elevation myocardial infarction involving left anterior descending (LAD) coronary artery (HCC) 12/10/2022   Allergies:  Allergies  Allergen Reactions   Beef-Derived Drug Products     Pork-Derived Products Other (See Comments)    Religious observance     Surgical History:  He  has a past surgical history that includes Coronary/Graft Acute MI Revascularization (N/A, 10/30/2021); LEFT HEART CATH AND CORONARY ANGIOGRAPHY (N/A, 10/30/2021); Coronary/Graft Acute MI Revascularization (  N/A, 12/10/2022); LEFT HEART CATH AND CORONARY ANGIOGRAPHY (N/A, 12/10/2022); CARDIOVERSION (N/A, 12/14/2022); RIGHT HEART CATH (N/A, 03/20/2023); Esophagogastroduodenoscopy (N/A, 03/23/2023); ICD IMPLANT (N/A, 06/01/2023); and RIGHT HEART CATH (N/A, 06/30/2023). Family History:  His family history includes Chronic Renal Failure in his brother and father; Diabetes in his brother, brother, father, and mother; Heart attack in his brother; Stroke in his brother.  REVIEW OF SYSTEMS  : All other systems reviewed and negative except where noted in the History of Present Illness.  PHYSICAL EXAM: BP 90/60   Pulse 77   Ht 6' (1.829 m)   Wt 144 lb (65.3 kg)   SpO2 99%   BMI 19.53 kg/m  Physical Exam   GENERAL APPEARANCE: thin appearing,  in no apparent distress. HEENT: No cervical lymphadenopathy, unremarkable thyroid , sclerae anicteric, conjunctiva pink. RESPIRATORY: Respiratory effort normal, breath sounds clear to auscultation bilaterally without rales, rhonchi, or wheezing. CARDIO: Regular rhythm with occasional extra beats, no murmurs, rubs, or gallops, peripheral pulses intact. AICD left upper chest.  ABDOMEN: Soft, non-distended, active bowel sounds in all four quadrants, mild tenderness on the left side, umbilical hernia present, no rebound, no mass appreciated. RECTAL: Declines. MUSCULOSKELETAL: Full range of motion, normal gait, without edema. SKIN: Dry, intact without rashes or lesions. No jaundice. NEURO: Alert, oriented, no focal deficits. PSYCH: Cooperative, normal mood and affect.      Alan JONELLE Coombs, PA-C 3:49 PM

## 2023-08-18 NOTE — Progress Notes (Signed)
 I agree with the assessment and plan as outlined by Ms. Steffanie Dunn.

## 2023-08-19 LAB — TSH+FREE T4: TSH W/REFLEX TO FT4: 11.3 m[IU]/L — ABNORMAL HIGH (ref 0.40–4.50)

## 2023-08-19 LAB — T4, FREE: Free T4: 1.6 ng/dL (ref 0.8–1.8)

## 2023-08-22 DIAGNOSIS — Z419 Encounter for procedure for purposes other than remedying health state, unspecified: Secondary | ICD-10-CM | POA: Diagnosis not present

## 2023-08-23 ENCOUNTER — Other Ambulatory Visit: Payer: Self-pay | Admitting: "Endocrinology

## 2023-08-23 ENCOUNTER — Other Ambulatory Visit (HOSPITAL_COMMUNITY): Payer: Self-pay

## 2023-08-23 ENCOUNTER — Telehealth: Payer: Self-pay

## 2023-08-23 ENCOUNTER — Ambulatory Visit: Payer: Self-pay | Admitting: "Endocrinology

## 2023-08-23 ENCOUNTER — Ambulatory Visit (HOSPITAL_COMMUNITY)
Admission: RE | Admit: 2023-08-23 | Discharge: 2023-08-23 | Disposition: A | Discharge status: Home / Self Care | Source: Ambulatory Visit | Attending: "Endocrinology | Admitting: "Endocrinology

## 2023-08-23 DIAGNOSIS — E039 Hypothyroidism, unspecified: Secondary | ICD-10-CM | POA: Insufficient documentation

## 2023-08-23 DIAGNOSIS — E041 Nontoxic single thyroid nodule: Secondary | ICD-10-CM | POA: Diagnosis not present

## 2023-08-23 DIAGNOSIS — E038 Other specified hypothyroidism: Secondary | ICD-10-CM

## 2023-08-23 MED ORDER — LEVOTHYROXINE SODIUM 100 MCG PO TABS
100.0000 ug | ORAL_TABLET | Freq: Every day | ORAL | 1 refills | Status: DC
  Filled 2023-08-23: qty 90, 90d supply, fill #0
  Filled 2023-09-07 – 2023-11-19 (×3): qty 90, 90d supply, fill #1

## 2023-08-23 NOTE — Telephone Encounter (Addendum)
 Device Alert received from [employer]. No messages received for at least 21 days.   __________________________________________________________________  Called patient regarding no transmission received for 21 days. Patient states monitor was plugged in beside his bedside, but it was not connecting. Patient was able to plug monitor into a different outlet while on the phone and states the OK symbol turned on and is connecting. Will continue to monitor for incoming transmissions.   Patient informed to call device for any further questions or concerns. Appreciative of call.

## 2023-08-25 ENCOUNTER — Other Ambulatory Visit (HOSPITAL_COMMUNITY): Payer: Self-pay

## 2023-08-25 ENCOUNTER — Ambulatory Visit (HOSPITAL_COMMUNITY): Payer: Self-pay | Admitting: Cardiology

## 2023-08-25 ENCOUNTER — Ambulatory Visit (HOSPITAL_COMMUNITY)
Admission: RE | Admit: 2023-08-25 | Discharge: 2023-08-25 | Disposition: A | Discharge status: Home / Self Care | Source: Ambulatory Visit | Attending: Cardiology | Admitting: Cardiology

## 2023-08-25 DIAGNOSIS — E119 Type 2 diabetes mellitus without complications: Secondary | ICD-10-CM | POA: Diagnosis not present

## 2023-08-25 DIAGNOSIS — Z7902 Long term (current) use of antithrombotics/antiplatelets: Secondary | ICD-10-CM | POA: Insufficient documentation

## 2023-08-25 DIAGNOSIS — I340 Nonrheumatic mitral (valve) insufficiency: Secondary | ICD-10-CM | POA: Diagnosis not present

## 2023-08-25 DIAGNOSIS — E785 Hyperlipidemia, unspecified: Secondary | ICD-10-CM | POA: Insufficient documentation

## 2023-08-25 DIAGNOSIS — E039 Hypothyroidism, unspecified: Secondary | ICD-10-CM | POA: Diagnosis not present

## 2023-08-25 DIAGNOSIS — I480 Paroxysmal atrial fibrillation: Secondary | ICD-10-CM | POA: Diagnosis not present

## 2023-08-25 DIAGNOSIS — I2510 Atherosclerotic heart disease of native coronary artery without angina pectoris: Secondary | ICD-10-CM | POA: Diagnosis not present

## 2023-08-25 DIAGNOSIS — I5022 Chronic systolic (congestive) heart failure: Secondary | ICD-10-CM | POA: Diagnosis not present

## 2023-08-25 DIAGNOSIS — I5020 Unspecified systolic (congestive) heart failure: Secondary | ICD-10-CM | POA: Diagnosis not present

## 2023-08-25 DIAGNOSIS — I4891 Unspecified atrial fibrillation: Secondary | ICD-10-CM | POA: Diagnosis not present

## 2023-08-25 DIAGNOSIS — I255 Ischemic cardiomyopathy: Secondary | ICD-10-CM | POA: Insufficient documentation

## 2023-08-25 DIAGNOSIS — Z7984 Long term (current) use of oral hypoglycemic drugs: Secondary | ICD-10-CM | POA: Diagnosis not present

## 2023-08-25 DIAGNOSIS — Z95810 Presence of automatic (implantable) cardiac defibrillator: Secondary | ICD-10-CM | POA: Insufficient documentation

## 2023-08-25 DIAGNOSIS — I272 Pulmonary hypertension, unspecified: Secondary | ICD-10-CM | POA: Insufficient documentation

## 2023-08-25 DIAGNOSIS — I252 Old myocardial infarction: Secondary | ICD-10-CM | POA: Diagnosis not present

## 2023-08-25 DIAGNOSIS — I959 Hypotension, unspecified: Secondary | ICD-10-CM | POA: Diagnosis not present

## 2023-08-25 DIAGNOSIS — Z79899 Other long term (current) drug therapy: Secondary | ICD-10-CM | POA: Insufficient documentation

## 2023-08-25 DIAGNOSIS — Z955 Presence of coronary angioplasty implant and graft: Secondary | ICD-10-CM | POA: Insufficient documentation

## 2023-08-25 DIAGNOSIS — Z7901 Long term (current) use of anticoagulants: Secondary | ICD-10-CM | POA: Diagnosis not present

## 2023-08-25 DIAGNOSIS — I110 Hypertensive heart disease with heart failure: Secondary | ICD-10-CM | POA: Diagnosis not present

## 2023-08-25 LAB — COMPREHENSIVE METABOLIC PANEL WITH GFR
ALT: 20 U/L (ref 0–44)
AST: 22 U/L (ref 15–41)
Albumin: 3.7 g/dL (ref 3.5–5.0)
Alkaline Phosphatase: 87 U/L (ref 38–126)
Anion gap: 9 (ref 5–15)
BUN: 19 mg/dL (ref 8–23)
CO2: 29 mmol/L (ref 22–32)
Calcium: 9.3 mg/dL (ref 8.9–10.3)
Chloride: 98 mmol/L (ref 98–111)
Creatinine, Ser: 1.3 mg/dL — ABNORMAL HIGH (ref 0.61–1.24)
GFR, Estimated: >60 mL/min (ref >60)
Glucose, Bld: 215 mg/dL — ABNORMAL HIGH (ref 70–99)
Potassium: 3.4 mmol/L — ABNORMAL LOW (ref 3.5–5.1)
Sodium: 136 mmol/L (ref 135–145)
Total Bilirubin: 0.7 mg/dL (ref 0.0–1.2)
Total Protein: 7.4 g/dL (ref 6.5–8.1)

## 2023-08-25 LAB — BRAIN NATRIURETIC PEPTIDE: B Natriuretic Peptide: 1477.8 pg/mL — ABNORMAL HIGH (ref 0.0–100.0)

## 2023-08-25 LAB — DIGOXIN LEVEL: Digoxin Level: 0.6 ng/mL — ABNORMAL LOW (ref 0.8–2.0)

## 2023-08-25 MED ORDER — TORSEMIDE 20 MG PO TABS
ORAL_TABLET | ORAL | 6 refills | Status: DC
  Filled 2023-08-25 – 2023-10-06 (×3): qty 180, fill #0

## 2023-08-25 MED ORDER — AMIODARONE HCL 200 MG PO TABS
100.0000 mg | ORAL_TABLET | Freq: Every day | ORAL | 5 refills | Status: AC
  Filled 2023-08-25: qty 30, 60d supply, fill #0
  Filled 2023-09-07 – 2023-10-19 (×3): qty 30, 60d supply, fill #1
  Filled 2023-12-19: qty 30, 60d supply, fill #2
  Filled 2024-01-25: qty 30, 60d supply, fill #3
  Filled 2024-02-16: qty 30, 60d supply, fill #0
  Filled 2024-02-16: qty 30, 60d supply, fill #3
  Filled 2024-02-16: qty 30, 60d supply, fill #0

## 2023-08-25 MED ORDER — TORSEMIDE 20 MG PO TABS
ORAL_TABLET | ORAL | 6 refills | Status: DC
  Filled 2023-08-25: qty 180, 72d supply, fill #0

## 2023-08-25 MED ORDER — POTASSIUM CHLORIDE CRYS ER 20 MEQ PO TBCR
EXTENDED_RELEASE_TABLET | ORAL | 6 refills | Status: DC
  Filled 2023-08-25: qty 180, 72d supply, fill #0
  Filled 2023-09-07 – 2023-10-06 (×2): qty 180, 72d supply, fill #1

## 2023-08-25 NOTE — Progress Notes (Signed)
 Patient presents for 1 month f/u in [facility] Clinic today alone. Recent hospitalization with initiation of VAD evaluation.    Pt ambulated into clinic today. He did not go to [place] as planned as his daughter had a car accident. States he is feeling much better over the last month. Denies lightheadedness, dizziness, falls, shortness of breath, chest pain, heart failure symptoms, and signs of bleeding. States he is able to walk 20 - 40 minutes without stopping. States he is able to go upstairs at his house, when previously he was unable to. Able to complete all ADLs without issue. Appetite is improving, and is experiencing less nausea. Weight is stable today.   Pt started Tadalafil  as prescribed last visit. Taking all medications as prescribed. Per Dr Rolan decrease Amiodarone  to 100 mg daily. Pt has some fluid on today. Per Dr Rolan increase Torsemide  to 60 mg alternating with 40 mg daily. To take K 60 meq with Torsemide  60 mg and K 40 meq with Torsemide  40 mg. Updated prescriptions sent to pt's pharmacy. Pt verbalized understanding to all medication changes.   Recently saw GI. Plan for routine colonoscopy when safe for Plavix  holiday.   Lengthy discussion regarding VAD implant with pt and Dr Rolan. Pt approved for VAD implant at [facility] in July. Pt confirmed he is not interested in transplant. Pt states he is not interested in VAD at this time, as he is feeling much better on current medications. His family prefers he not move forward with VAD unless absolutely necessary. If in the future pt wishes to move forward with VAD will arrange for him and his family to meet with a current VAD pt. Per Dr Rolan will obtain CPX testing prior to next follow up appt to gage timing of VAD implant. Pt verbalized understanding.   Vital Signs:  HR: 68 BP: 88/64 (73) SPO2: 98%   Weight: 143.8 lb  Last weight: 141.8 lb   Symptom YES NO DETAILS  Angina  x Activity:  Claudication  x How Far:  Syncope  x When:  Stroke   x   Orthopnea  x How many pillows:  PND  x How often:  CPAP  x How many hours:  Pedal Edema  x   Abdominal Fullness  x   Nausea / Vomit  x Appetite and nausea improving  Diaphoresis  x When:  Shortness of Breath  x Activity:  Palpitations  x When:  ICD shock  x   Bleeding S/S  x   Tea-colored Urine  x   Hospitalizations  x   Emergency Room  x   Other MD     Activity Increased tolerance of exercise 20 - 40 minutes daily  Fluid   Diet     Device: Bi[employer]ik Therapies: on  Last check: implanted 05/2023  Patient Instructions:  Decrease Amiodarone  to 100 mg (1/2 tablet) daily Take Torsemide  60 mg (3 tablets) alternating with Torsemide  40 mg (2 tablets) daily Take Potassium 60 meq (3 tablets) when you take Torsemide  60 mg. Take Potassium 40 meq (2 tablets) when taking Torsemide  40 mg Return to clinic in 1 month for follow up with Dr Rolan We will schedule you for CPX (exercise) testing   Isaiah Knoll RN VAD Coordinator  Office: [phone]  24/7 Pager: [phone]

## 2023-08-25 NOTE — Patient Instructions (Addendum)
 Decrease Amiodarone  to 100 mg (1/2 tablet) daily Take Torsemide  60 mg (3 tablets) alternating with Torsemide  40 mg (2 tablets) daily Take Potassium 60 meq (3 tablets) when you take Torsemide  60 mg. Take Potassium 40 meq (2 tablets) when taking Torsemide  40 mg Return to clinic in 1 month for follow up with Dr Rolan We will schedule you for CPX (exercise) testing

## 2023-08-26 NOTE — Progress Notes (Signed)
 Advanced Heart Failure Clinic Note  PCP: Vincente Shivers, NP HF Cardiology: Dr. Rolan  Chief complaint: CHF  68 y.o. with history of CAD, DM II, HTN, HLD, ischemic cardiomyopathy/chronic systolic CHF.   Patient had inferior STEMI 10/23 s/p PCI/DES to RCA. Had residual diffuse disease in diagonals and OMs. EF 40-45% at time of cath. Subsequent echo in 10/23 with EF 55-60%.   He was not taking medications in 2024 after losing insurance. Presented with anterior STEMI 12/10/22. Cardiac cath with 100% proximal LAD treated with PCI/DES. Prior RCA stent patent, severe disease in tortuous ramus and distal LCx treated medically.  Echo with EF 25%, no LV thrombus, AK mid septum to apex, moderately reduced RV systolic function. Course complicated by atrial fibrillation with RVR. Started on IV amiodarone  and underwent DCCV to SR.  GDMT limited by soft blood pressure. Also seen by GI for abdominal pain. Had thickening of stomach on CT scan. H. Pylori later resulted positive after discharge, seen by GI and started on treatment for H pylori-related gastritis.   Returned to ED 12/19/22 with recurrent atrial fibrillation. He converted to SR while in waiting room and was discharged home.  Admitted 2/25 with acute CHF. Diuresed with IV lasix . GDMT cut back 2/2 to low BP, Entresto  and spironolactone  stopped. He was discharged home, weight 154 lbs.  Echo in 2/25 showed EF 25% with mild RV dysfunction and mild RV enlargement, PASP 60 mmHg, moderate MR.   He was seen again in the ER 03/14/23 with worsening fatigue and dyspnea.  Torsemide  was increased to 40 qam/20 qpm.   Patient was set up for RHC in 3/25 due to ongoing severe fatigue. This showed low CI 2.02 by thermodilution as well as moderate pulmonary arterial hypertension, he was admitted and started on milrinone  gtt.  He was gradually titrated off milrinone  and maintained a good co-ox off milrinone .  He had a moderate right pleural effusion, underwent right  thoracentesis.  Fluid was transudative but grew E coli, he was treated with Bactrim  based on sensitivities.  Given early satiety and poor appetite, he had an EGD that showed gastritic, biopsies showed no H pylori or gastric cancer.  HRCT chest showed bibasilar bronchiectasis, ?aspiration. However, swallow study was not suggestive of aspiration. V/Q scan was done due to pulmonary hypertension, this showed no chronic PE.  He was started on sildenafil  and meds were titrated, he felt better when he went home.   Echo in 3/25 showed EF 25-30%, mild RV dysfunction, moderate MR, PASP 62 mmHg.   Biotronik ICD placed in 5/25.  Patient was admitted again in 6/25 with dyspnea and fatigue.  BP was low, midodrine  was started and spironolactone  and tadalafil  were stopped.  RHC again showed mixed pulmonary arterial/venous hypertension and low CI at 2.1 by Fick and thermodilution.  Echo showed EF 20-25%, mild LV dilation, moderate RV dysfunction, normal RV size, PASP 44 mmHg, mild-moderate MR.   Patient returns for followup of CHF.  At last visit, we had discussed pursuing LVAD towards the end of the summer.  However, he says that he is doing very well symptomatically at this point. His appetite is better and he no longer has nausea.  He walks about 20 minutes/day without dyspnea. He can climb a flight of stairs . No chest pain.  No orthopnea/PND.  His legs will still fatigue easily.  BP 92/57 today, no lightheadedness or falls.  Weight up 2 lbs.    Labs (12/24): hgb 15.1, BNP 350,  K 4.4, creatinine 1.24 Labs (2/25): K 4.1, creatinine 1.48, BNP 481, LFTs normal, free T3/T4 normal Labs (3/25): K 3.5, creatinine 1.44 => 1.27, hgb 13.4, BNP 812, HS-TnI 44 => 39 Labs (6/25): K 4, creatinine 1.39 Labs (7/25): digoxin  0.8, K 4.1, creatinine 1.46, BNP 1374  SH: No ETOH or tobacco use.  Lives at home in [place] with his wife and children. Originally from [place].   PMH: 1. Chronic systolic CHF: Ischemic cardiomyopathy.   Biotronik ICD.  - Echo (12/24): EF 20-25%, LAD territory WMAs, moderate LVH, moderately decreased RV systolic function, mild MR.  - Echo (2/25): EF 25% with mild RV dysfunction and mild RV enlargement, PASP 60 mmHg, moderate MR.  - RHC (3/25): mean RA 2, PA 60/18 mean 31, mean PCWP 17, CI 2.02 thermo, CI 2.52 Fick, PAPi 21, PVR 3.7 WU.  - Echo (3/25): EF 25-30%, mild RV dysfunction, moderate MR, PASP 62 mmHg.  - Echo (6/25): EF 20-25%, mild LV dilation, moderate RV dysfunction, normal RV size, PASP 44 mmHg, mild-moderate MR. - RHC (6/25): mean RA 5, PA 62/21 mean 35, mean PCWP 23, CI 2.1 by Fick and thermo, PVR 3.0 WU.  2. CAD: Inferior STEMI 10/23 with DES to RCA.  - Anterior STEMI (11/24): Occluded proximal LAD treated with DES, occluded D2, 90% OM1, 95% distal LCx.  Distal LCx and OM1 treated medically.  3. Atrial fibrillation: Paroxysmal.  - DCCV in 12/24.  4. H pylori gastritis 5. Hyperlipidemia 6. Type 2 diabetes 7. Pulmonary hypertension: RHC (3/25) with mean RA 2, PA 60/18 mean 31, mean PCWP 17, CI 2.02 thermo, CI 2.52 Fick, PAPi 21, PVR 3.7 WU. - V/Q scan (3/25): No chronic PE.  - HRCT chest (3/25): bibasilar bronchiectasis, ?aspiration.  - ANA, RF, anti-SCL70 Ab, anti-centromere Ab all negative.  8. Pleural effusion: On right, s/p thoracentesis.   Current Outpatient Medications  Medication Sig Dispense Refill   acetaminophen  (TYLENOL ) 325 MG tablet Take 1-2 tablets (325-650 mg total) by mouth every 4 (four) hours as needed for mild pain (pain score 1-3).     apixaban  (ELIQUIS ) 5 MG TABS tablet Take 1 tablet (5 mg total) by mouth 2 (two) times daily.     clopidogrel  (PLAVIX ) 75 MG tablet Take 1 tablet (75 mg total) by mouth daily. 90 tablet 3   digoxin  (LANOXIN ) 0.125 MG tablet Take 1 tablet (0.125 mg total) by mouth daily. 90 tablet 3   empagliflozin  (JARDIANCE ) 10 MG TABS tablet Take 1 tablet (10 mg total) by mouth daily before breakfast.     ezetimibe  (ZETIA ) 10 MG tablet Take  1 tablet (10 mg total) by mouth daily. 90 tablet 3   levothyroxine  (SYNTHROID ) 100 MCG tablet Take 1 tablet (100 mcg total) by mouth daily. 90 tablet 1   linaclotide  (LINZESS ) 145 MCG CAPS capsule Take 1 capsule (145 mcg total) by mouth daily before breakfast. 90 capsule 3   melatonin 5 MG TABS Take 5 mg by mouth at bedtime as needed (sleep).     midodrine  (PROAMATINE ) 5 MG tablet Take 1 tablet (5 mg total) by mouth 3 (three) times daily with meals. 90 tablet 0   pantoprazole  (PROTONIX ) 40 MG tablet Take 1 tablet (40 mg total) by mouth 2 (two) times daily before a meal. 180 tablet 3   rosuvastatin  (CRESTOR ) 40 MG tablet Take 40 mg by mouth daily.     tadalafil  (CIALIS ) 20 MG tablet Take 1 tablet (20 mg total) by mouth daily. 90 tablet 3  Vitamin D-Vitamin K (VITAMIN K2-VITAMIN D3 PO) Take 1 tablet by mouth daily with lunch.     amiodarone  (PACERONE ) 200 MG tablet Take 1/2 tablet (100 mg total) by mouth daily. 30 tablet 5   potassium chloride  SA (KLOR-CON  M) 20 MEQ tablet Take 3 tablets (60 meq) daily when taking torsemide  60 mg. Take 2 tablets (40 meq) daily when taking torsemide  40 mg. 180 tablet 6   torsemide  (DEMADEX ) 20 MG tablet Take 3 tablets (60 mg total) by mouth every other day alternating with 2 tablets (40 mg total) every other day. 180 tablet 6   No current facility-administered medications for this encounter.   Allergies  Allergen Reactions   Beef-Derived Drug Products    Pork-Derived Products Other (See Comments)    Religious observance   Family History  Problem Relation Age of Onset   Diabetes Mother    Chronic Renal Failure Father    Diabetes Father    Chronic Renal Failure Brother    Diabetes Brother    Diabetes Brother    Heart attack Brother    Stroke Brother    Wt Readings from Last 3 Encounters:  08/18/23 65.3 kg (144 lb)  08/17/23 64.4 kg (142 lb)  07/19/23 64.3 kg (141 lb 12.8 oz)  BP 92/57, weight 143 lbs  PHYSICAL EXAM: General: NAD Neck: JVP 8-9 cm,  no thyromegaly or thyroid  nodule.  Lungs: Clear to auscultation bilaterally with normal respiratory effort. CV: Nondisplaced PMI.  Heart regular S1/S2, no S3/S4, no murmur.  No peripheral edema.  No carotid bruit.  Normal pedal pulses.  Abdomen: Soft, nontender, no hepatosplenomegaly, no distention.  Skin: Intact without lesions or rashes.  Neurologic: Alert and oriented x 3.  Psych: Normal affect. Extremities: No clubbing or cyanosis.  HEENT: Normal.   ASSESSMENT & PLAN: 1. Chronic systolic CHF/ischemic cardiomyopathy:  Biotronik ICD.  Echo 12/24 with EF 20-25%, no LV thrombus, AK mid septum to apex, moderately reduced RV systolic function. Echo 2/25 with EF 25%, mildly reduced RV function.  Low BP has limited GDMT titration.  His profound fatigue has been concerning for low output HF. RHC 3/25 showed low CI by thermo (2.05) but preserved by Fick.  Borderline elevated PCWP, normal RA pressure.  There was also moderate mixed pulmonary arterial/pulmonary venous hypertension.  With his significant symptoms, he was started on milrinone  0.25 and added sildenafil  to try to lower PA pressure.  Milrinone  was later weaned off with medication adjustment, and co-ox remained excellent.  He was readmitted in 6/25 with CHF; RHC showed moderate mixed pulmonary arterial/pulmonary venous hypertension with CI low at 2.1 by Fick and thermo.  Echo in 6/25 showed EF 20-25%, mild LV dilation, moderate RV dysfunction, normal RV size, PASP 44 mmHg, mild-moderate MR.  He is symptomatically improved since hospitalization, now describes NYHA class II symptoms.  We have had to peel back on GDMT due to low BP and he is now taking midodrine .  Creatinine has trended up a bit to 1.46 most recently.  He is mildly volume overloaded on exam today.  - With low output heart failure (CI 2.1 and inability to tolerate GDMT - now on midodrine ), we have been discussing advanced therapies.  He is not interested in transplant and at his age, it  would be more difficult to obtain. I think he would be a good LVAD candidate. We have a window at this time while his RV function is not severely depressed and his renal function is relatively preserved.  He has completed workup.  We had discussed pursuing LVAD at the end of the summer, but today he says he is feeling symptomatically better and wants to continue to wait.  We had a long discussion about this today, I think he is limiting his activity and is probably more affected by CHF than he reports.  He agrees to a CPX, if he has poor functional capacity on CPX, I would strongly encourage LVAD.  - Increase torsemide  to 60 mg daily alternating with 40 mg daily.  BMET/BNP today and BMET in 10 days.  - Continue Jardiance  10 mg daily. - Continue digoxin  0.125 daily, check digoxin  level today.  - He is on midodrine  5 mg tid to maintain MAP.   - off Toprol  XL with low output.  - Off spironolactone  with low BP.  2. CAD:  Hx inferior STEMI 10/23 s/p PCI/DES to RCA. Anterior STEMI 12/10/22, cath showed occluded pLAD treated with PCI/DES, prior RCA stent patent, severe disease OM1 and distal LCx treated medically. No chest pain.  - Continue Plavix .   - No ASA given Eliquis  use.  - Continue rosuvastatin  40 mg + Zetia  10 mg daily. 3. Atrial fibrillation: Paroxysmal. NSR today.  - continue Eliquis  5 mg bid  - Decrease amiodarone  to 100 mg daily, check LFTs today.  He will need a regular eye exam.  4. DM II: Continue Jardiance  + SSI  5. 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 Not a smoker, no known lung disease. CXR has shown some chronic interstitial changes. HRCT chest with bibasilar bronchiectasis that may be due to aspiration (though swallow study negative).  V/Q scan showed no chronic PE. Rheumatologic serologies negative - Continue tadalafil  20 mg daily.   6. Pleural effusion: Moderate on right. S/p right thoracentesis 3/25. Pleural fluid was transudative  by Light's criteria, no malignant cells. However, it grew E coli that was treated with antibiotics.   7. GI: History of early satiety, GI discomfort.  CT abdomen/pelvis with gastric wall thickening. GI was consulted and EGD was performed, no definite evidence for malignancy, biopsies taken for H pylori testing and pathology => negative for H pylori and no gastric cancer. Heart failure may be the cause of his GI symptoms.  He reports some improvement recently.  8. 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  - PCP recently increased Levoxyl .   Followup 1 month in LVAD clinic after CPX.   I spent 41 minutes reviewing records, interviewing/examining patient, and managing orders.    Garrett Roberts 08/26/23

## 2023-08-29 ENCOUNTER — Ambulatory Visit: Admitting: Pulmonary Disease

## 2023-08-29 LAB — BASIC METABOLIC PANEL WITH GFR
BUN: 22 — AB (ref 4–21)
Chloride: 99 (ref 99–108)
Creatinine: 1.3 (ref 0.6–1.3)
Glucose: 137
Potassium: 3.7 meq/L (ref 3.5–5.1)
Sodium: 140 (ref 137–147)

## 2023-08-29 LAB — COMPREHENSIVE METABOLIC PANEL WITH GFR: Calcium: 9.3 (ref 8.7–10.7)

## 2023-08-29 LAB — LAB REPORT - SCANNED: EGFR: 61

## 2023-08-29 LAB — HEPATIC FUNCTION PANEL
Bilirubin, Direct: 0.18 (ref 0.01–0.4)
Bilirubin, Total: 0.6

## 2023-08-30 NOTE — Progress Notes (Unsigned)
 Electrophysiology Office Note:   Date:  09/01/2023  ID:  Garrett Roberts, DOB 1955-09-06, MRN [id number]  Primary Cardiologist: [name], MD Primary Heart Failure: None Electrophysiologist: Fonda Kitty, MD       History of Present Illness:   Garrett Roberts is a 67 y.o. male with h/o AF, HFrEF due to ICM s/p ICD, CAD s/p inferior STEMI 10/2021 s/p PCI/DES to RCA, HTN, HLD, DM II seen today for routine electrophysiology follow-up s/p Defibrillator implant.  Admit 06/2023 with dyspnea / fatigue. BP was low, midodrine  was started and spironolactone  and tadalafil  were stopped. RHC again showed mixed pulmonary arterial/venous HTN and low CI at 2.1 by Fick and thermodilution. ECHO with EF 20-25%, mild LV dilation, moderate RV dysfunction, normal RV size, PASP 44 mmHg, mild-moderate MR.   Since last being seen in our clinic the patient reports he is taking medications as prescribed.  He notes occasional mild LE swelling at times. He feels the look good today. He notes he had a couple of days where he felt as if he had a numbness at the site of the device but it went away.  He notes his thyroid  numbers were recently elevated and he is due a recheck.  He reports occasional dizziness upon standing.  He thinks the midodrine  helps some but not significantly in regards to his BP.  He denies chest pain, palpitations, dyspnea, PND, orthopnea, nausea, vomiting, dizziness, syncope, edema, weight gain, or early satiety.    Review of systems complete and found to be negative unless listed in HPI.    EP Information / Studies Reviewed:    EKG is not ordered today. EKG from 07/19/23 reviewed which showed NSR 71 bpm with 1st degree AVB      ICD Interrogation-  reviewed in detail today,  See PACEART report.  Device History: Biotronik [employer] ICD (RV mid septum) implanted 06/01/23 for prevention of SCD History of appropriate therapy: No History of AAD therapy: Yes; currently on  amiodarone    Risk Assessment/Calculations:    CHA2DS2-VASc Score = 5   This indicates a 7.2% annual risk of stroke. The patient's score is based upon: CHF History: 1 HTN History: 1 Diabetes History: 1 Stroke History: 0 Vascular Disease History: 1 Age Score: 1 Gender Score: 0             Physical Exam:   VS:  BP (!) 80/46 (BP Location: Left Arm, Patient Position: Sitting, Cuff Size: Normal)   Pulse 61   Wt 142 lb (64.4 kg)   SpO2 97%   BMI 19.26 kg/m    Wt Readings from Last 3 Encounters:  09/01/23 142 lb (64.4 kg)  08/18/23 144 lb (65.3 kg)  08/17/23 142 lb (64.4 kg)     GEN: thin adult male, well developed in no acute distress NECK: No JVD; No carotid bruits CARDIAC: Regular rate and rhythm, no murmurs, rubs, gallops, ICD site well healed, no tethering RESPIRATORY:  Clear to auscultation without rales, wheezing or rhonchi  ABDOMEN: Soft, non-tender, non-distended EXTREMITIES:  No LE edema; No deformity   ASSESSMENT AND PLAN:    Chronic Systolic Dysfunction due to ICM s/p Biotronik single chamber ICD  -euvolemic on exam / by device   -Stable on an appropriate medical regimen / GDMT per Advanced HF Team  -Normal ICD function -See Pace Art report -No changes today  Paroxysmal Atrial Fibrillation  CHA2DS2-VASc 5 -0% burden on device (Biotronik single chamber) -OAC for stroke prophylaxis  -amiodarone  100 mg daily -  digoxin  0.125 mg daily   Secondary Hypercoagulable State  -continue Eliquis  5mg  BID, dose reviewed and appropriate by age / wt   Chronic Hypotension  -midodrine  per AHF Team   Disposition:   Follow up with Dr. Kennyth / EP APP in 12 months   Signed, Daphne Barrack, NP-C, AGACNP-BC [facility] - Electrophysiology  09/01/2023, 12:25 PM

## 2023-08-31 ENCOUNTER — Ambulatory Visit: Payer: Self-pay

## 2023-08-31 ENCOUNTER — Telehealth: Payer: Self-pay

## 2023-08-31 NOTE — Telephone Encounter (Signed)
 Noted  Could be vascular but probably needs GI evaluation at this point Will discuss at tomorrow's visit

## 2023-08-31 NOTE — Telephone Encounter (Signed)
 FYI Only or Action Required?: FYI only for provider.  Patient was last seen in primary care on 07/12/2023 by Jimmy Charlie FERNS, MD.  Called Nurse Triage reporting Abdominal Pain.  Symptoms began several days ago.  Interventions attempted: Nothing.  Symptoms are: gradually worsening.  Triage Disposition: See Physician Within 24 Hours  Patient/caregiver understands and will follow disposition?: Yes  Copied from CRM #[id number]. Topic: Clinical - Red Word Triage >> Aug 31, 2023  2:41 PM [name] wrote: Red Word that prompted transfer to Nurse Triage: patient states he has pain in his stomach for about 3 days Reason for Disposition  [1] MODERATE pain (e.g., interferes with normal activities) AND [2] pain comes and goes (cramps) AND [3] present > 24 hours  (Exception: Pain with Vomiting or Diarrhea - see that Guideline.)  Answer Assessment - Initial Assessment Questions 1. LOCATION: Where does it hurt?      All over, but on right upper side and bottom left  2. RADIATION: Does the pain shoot anywhere else? (e.g., chest, back)     No  3. ONSET: When did the pain begin? (Minutes, hours or days ago)      3 days ago  4. SUDDEN: Gradual or sudden onset?     Gradual onset  5. PATTERN Does the pain come and go, or is it constant?     Come and goes, but worse when start moving  6. SEVERITY: How bad is the pain?  (e.g., Scale 1-10; mild, moderate, or severe)     6/10 pain  7. RECURRENT SYMPTOM: Have you ever had this type of stomach pain before? If Yes, ask: When was the last time? and What happened that time?      Yes, but was constipated  8. CAUSE: What do you think is causing the stomach pain? (e.g., gallstones, recent abdominal surgery)     Thinks it has to do with liver  9. RELIEVING/AGGRAVATING FACTORS: What makes it better or worse? (e.g., antacids, bending or twisting motion, bowel movement)     Moving is worse  10. OTHER SYMPTOMS: Do you have any other  symptoms? (e.g., back pain, diarrhea, fever, urination pain, vomiting)       *No Answer*  Protocols used: Abdominal Pain - Male-A-AH

## 2023-08-31 NOTE — Telephone Encounter (Signed)
 Garrett Roberts had an opening tomorrow afternoon and patient was rescheduled to that appt.

## 2023-08-31 NOTE — Telephone Encounter (Signed)
 Called and spoke with Dr. Johnie about patients trial drug medication study. Dr. Johnie was concerned about some recent labs they did on the patient and he did call the patient about these labs as well this morning. Dr. Johnie is faxing the labs over to the office now; I gave him our fax number. Advised Dr. Johnie that patient has a f/u here on 09/12/23 and we can address any concerns then unless patient needs to be seen sooner; Dr. Johnie was okay with appt on 09/12/23. Awaiting results to come in and will give to Bableen Kaur, NP when received.

## 2023-08-31 NOTE — Telephone Encounter (Signed)
 Copied from CRM #[id number]. Topic: Clinical - Lab/Test Results >> Aug 31, 2023  9:51 AM Dedra NOVAK wrote: Reason for CRM: Dr. Lynwood Ryder from [facility] called regarding pt labs. Pt is currently enrolled in a clinical trial with the company and his labs came back abnormal. Please call Dr. Ryder at [phone].

## 2023-09-01 ENCOUNTER — Other Ambulatory Visit (HOSPITAL_COMMUNITY): Payer: Self-pay

## 2023-09-01 ENCOUNTER — Ambulatory Visit: Attending: Pulmonary Disease | Admitting: Pulmonary Disease

## 2023-09-01 ENCOUNTER — Encounter: Payer: Self-pay | Admitting: Pulmonary Disease

## 2023-09-01 ENCOUNTER — Ambulatory Visit: Admitting: Internal Medicine

## 2023-09-01 ENCOUNTER — Encounter: Payer: Self-pay | Admitting: General Practice

## 2023-09-01 ENCOUNTER — Ambulatory Visit (INDEPENDENT_AMBULATORY_CARE_PROVIDER_SITE_OTHER): Admitting: General Practice

## 2023-09-01 VITALS — BP 80/46 | HR 61 | Wt 142.0 lb

## 2023-09-01 VITALS — BP 108/40 | HR 65 | Temp 98.0°F | Ht 72.0 in | Wt 143.0 lb

## 2023-09-01 DIAGNOSIS — I9589 Other hypotension: Secondary | ICD-10-CM

## 2023-09-01 DIAGNOSIS — Z95811 Presence of heart assist device: Secondary | ICD-10-CM

## 2023-09-01 DIAGNOSIS — I5020 Unspecified systolic (congestive) heart failure: Secondary | ICD-10-CM | POA: Diagnosis not present

## 2023-09-01 DIAGNOSIS — G8929 Other chronic pain: Secondary | ICD-10-CM | POA: Insufficient documentation

## 2023-09-01 DIAGNOSIS — Z7901 Long term (current) use of anticoagulants: Secondary | ICD-10-CM

## 2023-09-01 DIAGNOSIS — R748 Abnormal levels of other serum enzymes: Secondary | ICD-10-CM | POA: Insufficient documentation

## 2023-09-01 DIAGNOSIS — R109 Unspecified abdominal pain: Secondary | ICD-10-CM

## 2023-09-01 DIAGNOSIS — I2510 Atherosclerotic heart disease of native coronary artery without angina pectoris: Secondary | ICD-10-CM | POA: Diagnosis not present

## 2023-09-01 DIAGNOSIS — D6869 Other thrombophilia: Secondary | ICD-10-CM | POA: Diagnosis not present

## 2023-09-01 DIAGNOSIS — I480 Paroxysmal atrial fibrillation: Secondary | ICD-10-CM | POA: Diagnosis not present

## 2023-09-01 LAB — CUP PACEART INCLINIC DEVICE CHECK
Battery Remaining Percentage: 100 %
Battery Voltage: 3.18 V
Brady Statistic AS VP Percent: 0 %
Brady Statistic AS VS Percent: 100 %
Brady Statistic RV Percent Paced: 0 %
Date Time Interrogation Session: 20250822123717
HighPow Impedance: 58 Ohm
Implantable Lead Connection Status: 753985
Implantable Lead Implant Date: 20250522
Implantable Lead Location: 753860
Implantable Lead Serial Number: 81628712
Implantable Pulse Generator Implant Date: 20250522
Lead Channel Impedance Value: 479 Ohm
Lead Channel Pacing Threshold Amplitude: 0.6 V
Lead Channel Pacing Threshold Pulse Width: 0.4 ms
Lead Channel Setting Pacing Amplitude: 2 V
Lead Channel Setting Pacing Pulse Width: 0.4 ms
Lead Channel Setting Sensing Sensitivity: 0.8 mV
Pulse Gen Model: 429525
Pulse Gen Serial Number: 88734280
Zone Setting Status: 755011

## 2023-09-01 LAB — GAMMA GT: GGT: 162 — AB (ref 18–76)

## 2023-09-01 NOTE — Patient Instructions (Addendum)
 Call and schedule appt with [facility] for abdominal pain.   Start Colace (docusate sodium) 100 mg two tablets at bedtime everyday to help with constipation.  Make sure you are drinking enough water and fiber intake.   It was a pleasure to see you today!

## 2023-09-01 NOTE — Patient Instructions (Signed)
 Medication Instructions:  Your physician recommends that you continue on your current medications as directed. Please refer to the Current Medication list given to you today.  *If you need a refill on your cardiac medications before your next appointment, please call your pharmacy*  Lab Work: None ordered If you have labs (blood work) drawn today and your tests are completely normal, you will receive your results only by: MyChart Message (if you have MyChart) OR A paper copy in the mail If you have any lab test that is abnormal or we need to change your treatment, we will call you to review the results.  Follow-Up: At [facility], you and your health needs are our priority.  As part of our continuing mission to provide you with exceptional heart care, our providers are all part of one team.  This team includes your primary Cardiologist (physician) and Advanced Practice Providers or APPs (Physician Assistants and Nurse Practitioners) who all work together to provide you with the care you need, when you need it.  Your next appointment:   1 year(s)  Provider:   Fonda Kitty, MD or Daphne Barrack, NP

## 2023-09-01 NOTE — Progress Notes (Signed)
 Established Patient Office Visit  Subjective   Patient ID: Garrett Roberts, male    DOB: 02-05-1955  Age: 68 y.o. MRN: [id number]  Chief Complaint  Patient presents with   Abdominal Pain    In the upper middle of stomach and right side   Hypotension    Patients BP has been low for awhile and also causing dizziness    Abdominal Pain Pertinent negatives include no constipation, diarrhea, dysuria, fever, frequency, headaches, nausea or vomiting.   Discussed the use of AI scribe software for clinical note transcription with the patient, who gave verbal consent to proceed.  History of Present Illness Garrett Roberts is a 68 year old male who presents with persistent abdominal pain and abnormal liver function test.  He experiences persistent abdominal pain located in the middle and right side of the abdomen. The pain persists even after bowel movements, although its nature changes. He has a history of chronic constipation and notes that imaging consistently shows a significant stool burden in his colon. Recent blood work revealed an elevated gamma-glutamyl transferase (GGT) level of 162, while other liver function tests were normal.   He underwent an ultrasound to evaluate for abdominal aortic aneurysm, and he was told there was no aneurysm, but his pain continues.   He reports low appetite and fatigue. He also reports low blood pressure readings at home, with values sometimes as low as 80/46.He takes midodrine  three times a day to help with blood pressure, but his blood pressure remains low.    Patient Active Problem List   Diagnosis Date Noted   Chronic abdominal pain 09/01/2023   Elevated serum GGT level 09/01/2023   Epigastric pain 07/12/2023   HFrEF (heart failure with reduced ejection fraction) (HCC) 06/30/2023   Recurrent right pleural effusion 06/30/2023   Chronic hypotension 06/30/2023   Renal insufficiency 06/30/2023   Hypothyroidism 06/30/2023   GERD  (gastroesophageal reflux disease) 06/30/2023   Presence of heart assist device (HCC) 06/12/2023   Chronic systolic heart failure (HCC) 06/01/2023   Poor appetite 04/19/2023   Hospital discharge follow-up 04/05/2023   Subclinical hypothyroidism 04/05/2023   Loss of weight 03/23/2023   Protein-calorie malnutrition, severe 03/22/2023   Acute on chronic systolic CHF (congestive heart failure) (HCC) 02/17/2023   Ischemic cardiomyopathy 12/19/2022   Chronic HFrEF (heart failure with reduced ejection fraction) (HCC) 12/19/2022   CKD (chronic kidney disease), 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   Type 2 diabetes mellitus with complication, without long-term current use of insulin  (HCC) 12/15/2022   Atrial fibrillation (HCC) 12/14/2022   Constipation 12/12/2022   Left sided abdominal pain 12/11/2022   Abnormal finding on GI tract imaging 12/11/2022   CAD (coronary artery disease) 10/11/2022   Essential hypertension 10/11/2022   Hyperlipidemia 10/11/2022   Vitamin D deficiency 08/03/2015   Past Medical History:  Diagnosis Date   Acute ST elevation myocardial infarction (STEMI) of anterior wall (HCC) 12/10/2022   Acute ST elevation myocardial infarction (STEMI) of inferior wall (HCC) 10/30/2021   Atrial fibrillation (HCC)    Atrial fibrillation with RVR (HCC) 12/19/2022   CHF (congestive heart failure) (HCC)    Coronary artery disease    Diabetes mellitus without complication (HCC)    DM (diabetes mellitus) (HCC)    H. pylori infection    Heart attack (HCC)    HLD (hyperlipidemia)    Hypertension    ST elevation myocardial infarction involving left anterior descending (LAD) coronary artery (HCC) 12/10/2022   Past Surgical History:  Procedure Laterality Date  CARDIOVERSION N/A 12/14/2022   Procedure: CARDIOVERSION;  Surgeon: Kate Lonni CROME, MD;  Location: [facility] INVASIVE CV LAB;  Service: Cardiovascular;  Laterality: N/A;   CORONARY/GRAFT ACUTE MI REVASCULARIZATION N/A 10/30/2021   Procedure:  Coronary/Graft Acute MI Revascularization;  Surgeon: Ladona Heinz, MD;  Location: [facility] INVASIVE CV LAB;  Service: Cardiovascular;  Laterality: N/A;   CORONARY/GRAFT ACUTE MI REVASCULARIZATION N/A 12/10/2022   Procedure: Coronary/Graft Acute MI Revascularization;  Surgeon: Wonda Sharper, MD;  Location: [facility] INVASIVE CV LAB;  Service: Cardiovascular;  Laterality: N/A;   ESOPHAGOGASTRODUODENOSCOPY N/A 03/23/2023   Procedure: EGD (ESOPHAGOGASTRODUODENOSCOPY);  Surgeon: Stacia Glendia BRAVO, MD;  Location: [facility] ENDOSCOPY;  Service: Gastroenterology;  Laterality: N/A;   ICD IMPLANT N/A 06/01/2023   Procedure: ICD IMPLANT;  Surgeon: Kennyth Chew, MD;  Location: [facility] INVASIVE CV LAB;  Service: Cardiovascular;  Laterality: N/A;   LEFT HEART CATH AND CORONARY ANGIOGRAPHY N/A 10/30/2021   Procedure: LEFT HEART CATH AND CORONARY ANGIOGRAPHY;  Surgeon: Ladona Heinz, MD;  Location: [facility] INVASIVE CV LAB;  Service: Cardiovascular;  Laterality: N/A;   LEFT HEART CATH AND CORONARY ANGIOGRAPHY N/A 12/10/2022   Procedure: LEFT HEART CATH AND CORONARY ANGIOGRAPHY;  Surgeon: Wonda Sharper, MD;  Location: [facility] INVASIVE CV LAB;  Service: Cardiovascular;  Laterality: N/A;   RIGHT HEART CATH N/A 03/20/2023   Procedure: RIGHT HEART CATH;  Surgeon: Rolan Ezra RAMAN, MD;  Location: [facility] INVASIVE CV LAB;  Service: Cardiovascular;  Laterality: N/A;   RIGHT HEART CATH N/A 06/30/2023   Procedure: RIGHT HEART CATH;  Surgeon: Cherrie Toribio SAUNDERS, MD;  Location: [facility] INVASIVE CV LAB;  Service: Cardiovascular;  Laterality: N/A;   Allergies  Allergen Reactions   Beef-Derived Drug Products    Pork-Derived Products Other (See Comments)    Religious observance         09/01/2023    3:06 PM 07/12/2023   12:17 PM 07/12/2023   11:37 AM  Depression screen PHQ 2/9  Decreased Interest 0 0 0  Down, Depressed, Hopeless 0 0 0  PHQ - 2 Score 0 0 0  Altered sleeping 0    Tired, decreased energy 0    Change in appetite 0    Feeling bad or failure about  yourself  0    Trouble concentrating 0    Moving slowly or fidgety/restless 0    Suicidal thoughts 0    PHQ-9 Score 0    Difficult doing work/chores Not difficult at all  Not difficult at all       06/12/2023   11:09 AM 04/05/2023    2:33 PM  GAD 7 : Generalized Anxiety Score  Nervous, Anxious, on Edge 0 0  Control/stop worrying 0 0  Worry too much - different things 0 0  Trouble relaxing 0 0  Restless 0 0  Easily annoyed or irritable 0 0  Afraid - awful might happen 0 0  Total GAD 7 Score 0 0  Anxiety Difficulty Not difficult at all Not difficult at all      Review of Systems  Constitutional:  Negative for chills and fever.  Respiratory:  Negative for shortness of breath.   Cardiovascular:  Negative for chest pain.  Gastrointestinal:  Positive for abdominal pain. Negative for constipation, diarrhea, heartburn, nausea and vomiting.  Genitourinary:  Negative for dysuria, frequency and urgency.  Neurological:  Negative for dizziness and headaches.  Endo/Heme/Allergies:  Negative for polydipsia.  Psychiatric/Behavioral:  Negative for depression and suicidal ideas. The patient is not nervous/anxious.  Objective:     BP (!) 108/40   Pulse 65   Temp 98 F (36.7 C) (Oral)   Ht 6' (1.829 m)   Wt 143 lb (64.9 kg)   SpO2 99%   BMI 19.39 kg/m  BP Readings from Last 3 Encounters:  09/01/23 (!) 108/40  09/01/23 (!) 80/46  08/18/23 90/60   Wt Readings from Last 3 Encounters:  09/01/23 143 lb (64.9 kg)  09/01/23 142 lb (64.4 kg)  08/18/23 144 lb (65.3 kg)      Physical Exam Vitals and nursing note reviewed.  Constitutional:      Appearance: Normal appearance.  Cardiovascular:     Rate and Rhythm: Normal rate and regular rhythm.     Pulses: Normal pulses.     Heart sounds: Normal heart sounds.  Pulmonary:     Effort: Pulmonary effort is normal.     Breath sounds: Normal breath sounds.  Abdominal:     Tenderness: There is abdominal tenderness in the right  upper quadrant and periumbilical area.  Neurological:     Mental Status: He is alert and oriented to person, place, and time.  Psychiatric:        Mood and Affect: Mood normal.        Behavior: Behavior normal.        Thought Content: Thought content normal.        Judgment: Judgment normal.      Results for orders placed or performed in visit on 09/01/23  Basic metabolic panel with GFR  Result Value Ref Range   Glucose 137    BUN 22 (A) 4 - 21   Creatinine 1.3 0.6 - 1.3   Potassium 3.7 3.5 - 5.1 mEq/L   Sodium 140 137 - 147   Chloride 99 99 - 108  Comprehensive metabolic panel with GFR  Result Value Ref Range   Calcium  9.3 8.7 - 10.7  Hepatic function panel  Result Value Ref Range   Bilirubin, Direct 0.18 0.01 - 0.4   Bilirubin, Total 0.6   Gamma GT  Result Value Ref Range   GGT 162 (A) 18 - 76  Results for orders placed or performed in visit on 09/01/23  CUP PACEART [facility] DEVICE CHECK  Result Value Ref Range   Date Time Interrogation Session [phone]    Pulse Generator Manufacturer BITR    Pulse Gen Model [id number] Acticor 7 VR-T DX    Pulse Gen Serial Number [id number]    [facility]inic Name [facility]    Implantable Pulse Generator Type Implantable Cardiac Defibulator    Implantable Pulse Generator Implant Date [id number]    Implantable Lead Manufacturer OTHER    Implantable Lead Model PAMIRA S DX 65/15 [id number]    Implantable Lead Serial Number [id number]    Implantable Lead Implant Date [id number]    Implantable Lead Location Detail 1 UNKNOWN    Implantable Lead Location [id number]    Implantable Lead Connection Status 246014    Lead Channel Setting Sensing Sensitivity 0.8 mV   Lead Channel Setting Pacing Pulse Width 0.4 ms   Lead Channel Setting Pacing Amplitude 2.0 V   Zone Setting Status Active    Zone Setting Status Active    Zone Setting Status [phone]    Lead Channel Impedance Value 479 ohm   Lead Channel Pacing Threshold Amplitude 0.6 V   Lead Channel  Pacing Threshold Pulse Width 0.4 ms   HighPow Impedance 58 ohm   Battery Status BOS    Battery Remaining  Percentage 100 %   Battery Voltage 3.18 V   Brady Statistic RV Percent Paced 0 %   Brady Statistic AS VP Percent 0 %   Brady Statistic AS VS Percent 100 %   Eval Rhythm SR        The ASCVD Risk score (Arnett DK, et al., 2019) failed to calculate for the following reasons:   Risk score cannot be calculated because patient has a medical history suggesting prior/existing ASCVD    Assessment & Plan:  Chronic abdominal pain  Elevated serum GGT level  Chronic hypotension    Assessment and Plan Assessment & Plan Chronic abdominal pain with chronic constipation Chronic abdominal pain likely related to constipation, with imaging showing stool in colon in March and June. Differential includes liver or gallbladder issues, but previous imaging showed no liver abnormalities. - Start Colace 100 mg, two tablets at bedtime daily. - Increase water and fiber intake. - Schedule appointment with GI for further evaluation. - Consider MRCP or liver ultrasound if needed.  Elevated gamma-glutamyl transferase (GGT) with normal liver function Elevated GGT at 162 with normal liver function tests, possibly indicating liver damage. No liver abnormalities on previous imaging. Potential relation to abdominal pain. - Schedule appointment with Alan for further evaluation. - Consider MRCP or liver ultrasound.  Hypotension Currently on midodrine  TID.  Following with cardiology.    Return if symptoms worsen or fail to improve.    Carrol Aurora, NP

## 2023-09-04 ENCOUNTER — Ambulatory Visit: Payer: Self-pay | Admitting: Cardiology

## 2023-09-06 ENCOUNTER — Ambulatory Visit (HOSPITAL_COMMUNITY): Payer: Self-pay | Admitting: Cardiology

## 2023-09-06 ENCOUNTER — Ambulatory Visit (HOSPITAL_COMMUNITY)
Admission: RE | Admit: 2023-09-06 | Discharge: 2023-09-06 | Disposition: A | Discharge status: Home / Self Care | Source: Ambulatory Visit | Attending: Cardiology | Admitting: Cardiology

## 2023-09-06 DIAGNOSIS — Z7901 Long term (current) use of anticoagulants: Secondary | ICD-10-CM | POA: Insufficient documentation

## 2023-09-06 DIAGNOSIS — Z95811 Presence of heart assist device: Secondary | ICD-10-CM | POA: Insufficient documentation

## 2023-09-06 LAB — BASIC METABOLIC PANEL WITH GFR
Anion gap: 11 (ref 5–15)
BUN: 16 mg/dL (ref 8–23)
CO2: 26 mmol/L (ref 22–32)
Calcium: 9.4 mg/dL (ref 8.9–10.3)
Chloride: 97 mmol/L — ABNORMAL LOW (ref 98–111)
Creatinine, Ser: 1.34 mg/dL — ABNORMAL HIGH (ref 0.61–1.24)
GFR, Estimated: 58 mL/min — ABNORMAL LOW (ref >60)
Glucose, Bld: 123 mg/dL — ABNORMAL HIGH (ref 70–99)
Potassium: 3.8 mmol/L (ref 3.5–5.1)
Sodium: 134 mmol/L — ABNORMAL LOW (ref 135–145)

## 2023-09-06 NOTE — Progress Notes (Unsigned)
 09/07/2023 Garrett Roberts [id number] 1955/01/18  Referring provider: Vincente Shivers, NP Primary GI doctor: Dr. Federico  ASSESSMENT AND PLAN:  Constipation with discomfort, bloating, weight loss No melena, no hematochezia 11/06/2013 colonoscopy at [facility] for abdominal discomfort and lower abdominal pain good preparation completely unremarkable recall 10 years CTAP March 2025 in the hospital unremarkable  Linzess  145 mcg daily, having BM every day or every other day, stool is loose, incomplete Bm's - continue linzess  145 mcg daily, add on low dose of fiber - Plan colonoscopy post-cardiology clearance for Plavix /Eliquis  hold at the hospital due to HFrHF, ideally one year post-stent in Nov/Dec, will schedule follow up in the office after patient sees Dr. Rolan 10/3 and schedule colonoscopy in the hospital at that visit - Instructed to report new symptoms like blood in stool.  RUQ pain x 10 days constant, can be worse with movement, worse at night before bed, feels swollen per patient, has early satiety, no nausea/vomiting CT abdomen pelvis March 2025 in the hospital with normal liver Per patient he has had elevated LFTs but I only see elevated GGT, normal AST, ALT, alk phos and bilirubin    Latest Ref Rng & Units 08/29/2023   12:00 AM 08/25/2023   11:25 AM 07/19/2023    1:33 PM  Hepatic Function  Total Protein 6.5 - 8.1 g/dL  7.4  7.4   Albumin  3.5 - 5.0 g/dL  3.7  3.7   AST 15 - 41 U/L  22  27   ALT 0 - 44 U/L  20  25   Alk Phosphatase 38 - 126 U/L  87  75   Total Bilirubin 0.0 - 1.2 mg/dL  0.7  0.8   Bilirubin, Direct 0.01 - 0.4 0.18           This result is from an external source.   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 pylori infection stool test treated with quadruple therapy 03/23/2023 EGD Dr. Stacia for abnormal CT showed normal esophagus, 4 cm HH, granular gastric mucosa normal duodenum no evidence of malignancy.   Path negative H. pylori negative dysplasia and malignancy did show mild chronic gastritis and focal intestinal metaplasia, no recommendation for routine surveillance Office visit 04/2023 started on mirtazapine  50 mg at bedtime, continue for now, weight is stable Continue pantoprazole  40 mg twice daily, denies GERD No plan to repeat EGD  CAD  10/2021 status post PCI RCA STEMI 12/10/22. Cardiac cath with 100% proximal LAD treated with PCI/DES. Prior RCA stent patent, severe disease in tortuous ramus and distal LCx treated medically.  On Plavix  Ideally can consider colonoscopy 12/10/2023 Has OV with Dr. 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  S/p Biotronik ICD 5/25. 06/2023 EF 20-25%, no AS Will need to be done at the hospital  Transudative pleural effusion 03/2023 He had a moderate right pleural effusion, underwent right thoracentesis. Fluid was transudative but grew E coli, he was treated with Bactrim  based on sensitivities.   Wt Readings from Last 10 Encounters:  09/07/23 144 lb 4 oz (65.4 kg)  09/01/23 143 lb (64.9 kg)   09/01/23 142 lb (64.4 kg)  08/18/23 144 lb (65.3 kg)  08/17/23 142 lb (64.4 kg)  07/19/23 141 lb 12.8 oz (64.3 kg)  07/12/23 139 lb (63 kg)  07/12/23 139 lb 9.6 oz (63.3 kg)  07/06/23 143 lb (64.9 kg)  07/05/23 141 lb 11.2 oz (64.3 kg)    Patient Care Team: Vincente Shivers, NP as PCP - General (General Practice) Elmira Newman PARAS, MD as PCP - Cardiology (Cardiology) Kennyth Chew, MD as PCP - Electrophysiology (Cardiology)  HISTORY OF PRESENT ILLNESS: 68 y.o. male with a past medical history coronary artery disease status post non-STEMI 2023 PCI DES to RCA, heart failure ejection fraction 40 to 45%, diabetes hypertension and others listed below presents for hospital follow-up 3/10-3/14/2025 for abnormal CT.  Last seen in the office by myself 08/18/2023 for constipation given Linzess  samples with follow-up, plan for colonoscopy post 1 year stent placement in November/December  Discussed the use of AI scribe software for clinical note transcription with the patient, who gave verbal consent to proceed.  History of Present Illness   Garrett Roberts is a 68 year old male who presents with persistent gastrointestinal symptoms. He was referred by his primary care physician due to concerns about liver function and abdominal pain.  He has been experiencing constipation and abdominal discomfort since at least August, with symptoms including bloating and incomplete bowel movements. Linzess  145 mcg daily has been prescribed, which has helped to some extent, resulting in looser stools, but he still experiences incomplete bowel movements. Bowel movements occur most days, sometimes every other day, and without Linzess , stools are hard.  He reports a new onset of pain under the ribs for the past ten days, which worsens when lying down and sometimes feels like 'a swallow and pushing my lungs up,' leading to coughing. No heartburn or reflux is present, but he has a poor appetite, feels full  quickly, and experiences nausea without vomiting. The pain is constant, with varying intensity, and is not related to food intake or movement.  He has a history of a heart attack in November 2024 and is currently on Plavix  and Eliquis . Mirtazapine  (Remeron ) is taken at night to help with appetite, though it has not been effective. Since his heart attack, he has experienced significant weight loss, though his weight has stabilized recently.  No blood in the stool, dark stools, fevers, chills, or night sweats are reported. There is no swelling in his legs and no issues with drinking liquids. He uses Glucerna and ground [place] in his diet.      Wt Readings from Last 20 Encounters:  09/07/23 144 lb 4 oz (65.4 kg)  09/01/23 143 lb (64.9 kg)  09/01/23 142 lb (64.4 kg)  08/18/23 144 lb (65.3 kg)  08/17/23 142 lb (64.4 kg)  07/19/23 141 lb 12.8 oz (64.3 kg)  07/12/23 139 lb (63 kg)  07/12/23 139 lb 9.6 oz (63.3 kg)  07/06/23 143 lb (64.9 kg)  07/05/23 141 lb 11.2 oz (64.3 kg)  06/16/23  150 lb (68 kg)  06/13/23 148 lb (67.1 kg)  06/12/23 145 lb (65.8 kg)  06/01/23 142 lb 3.2 oz (64.5 kg)  05/23/23 148 lb 9.6 oz (67.4 kg)  05/02/23 152 lb 12.8 oz (69.3 kg)  04/21/23 147 lb 3.2 oz (66.8 kg)  04/14/23 146 lb (66.2 kg)  04/12/23 148 lb (67.1 kg)  04/05/23 146 lb (66.2 kg)     He  reports that he has never smoked. He has never used smokeless tobacco. He reports that he does not drink alcohol and does not use drugs.  RELEVANT GI HISTORY, IMAGING AND LABS: Results   LABS GGT: 1 AST: Within normal limits ALT: Within normal limits Total bilirubin: Normal Alkaline phosphatase: Normal Sodium: Low (09/06/2023) Potassium: Normal (09/06/2023) GFR: 68 (08/24/2023) Creatinine: Stable (09/06/2023) BUN: Stable (09/06/2023) Hepatitis panel: Negative (06/2023)  RADIOLOGY Abdominal CT: Liver unremarkable, gallbladder unremarkable, gastric thickening (03/2023)  DIAGNOSTIC Endoscopy: Negative for  H. pylori, no malignancy      CBC    Component Value Date/Time   WBC 5.1 07/06/2023 1226   RBC 4.89 07/06/2023 1226   HGB 13.9 07/06/2023 1226   HGB 13.8 05/29/2023 1142   HCT 43.1 07/06/2023 1226   HCT 42.4 05/29/2023 1142   PLT 237.0 07/06/2023 1226   PLT 236 05/29/2023 1142   MCV 88.1 07/06/2023 1226   MCV 90 05/29/2023 1142   MCH 28.6 07/05/2023 0248   MCHC 32.3 07/06/2023 1226   RDW 15.9 (H) 07/06/2023 1226   RDW 14.4 05/29/2023 1142   LYMPHSABS 2.3 05/29/2023 1142   MONOABS 1.0 03/20/2023 1638   EOSABS 0.2 05/29/2023 1142   BASOSABS 0.1 05/29/2023 1142   Recent Labs    05/29/23 1142 06/30/23 0054 06/30/23 1419 06/30/23 1420 06/30/23 1424 07/02/23 0255 07/03/23 0308 07/04/23 0255 07/05/23 0248 07/06/23 1226  HGB 13.8 14.9 12.9* 15.6 15.6 14.0 13.5 14.1 14.4 13.9    CMP     Component Value Date/Time   NA 134 (L) 09/06/2023 1045   NA 140 08/29/2023 0000   K 3.8 09/06/2023 1045   CL 97 (L) 09/06/2023 1045   CO2 26 09/06/2023 1045   GLUCOSE 123 (H) 09/06/2023 1045   BUN 16 09/06/2023 1045   BUN 22 (A) 08/29/2023 0000   CREATININE 1.34 (H) 09/06/2023 1045   CALCIUM  9.4 09/06/2023 1045   PROT 7.4 08/25/2023 1125   ALBUMIN  3.7 08/25/2023 1125   AST 22 08/25/2023 1125   ALT 20 08/25/2023 1125   ALKPHOS 87 08/25/2023 1125   BILITOT 0.7 08/25/2023 1125   GFRNONAA 58 (L) 09/06/2023 1045   GFRAA >90 09/25/2012 1823      Latest Ref Rng & Units 08/29/2023   12:00 AM 08/25/2023   11:25 AM 07/19/2023    1:33 PM  Hepatic Function  Total Protein 6.5 - 8.1 g/dL  7.4  7.4   Albumin  3.5 - 5.0 g/dL  3.7  3.7   AST 15 - 41 U/L  22  27   ALT 0 - 44 U/L  20  25   Alk Phosphatase 38 - 126 U/L  87  75   Total Bilirubin 0.0 - 1.2 mg/dL  0.7  0.8   Bilirubin, Direct 0.01 - 0.4 0.18           This result is from an external source.      Current Medications:   Current Outpatient Medications (Endocrine & Metabolic):    empagliflozin  (JARDIANCE ) 10 MG TABS tablet,  Take 1 tablet (10  mg total) by mouth daily before breakfast.   levothyroxine  (SYNTHROID ) 100 MCG tablet, Take 1 tablet (100 mcg total) by mouth daily.  Current Outpatient Medications (Cardiovascular):    amiodarone  (PACERONE ) 200 MG tablet, Take 1/2 tablet (100 mg total) by mouth daily.   digoxin  (LANOXIN ) 0.125 MG tablet, Take 1 tablet (0.125 mg total) by mouth daily.   ezetimibe  (ZETIA ) 10 MG tablet, Take 1 tablet (10 mg total) by mouth daily.   midodrine  (PROAMATINE ) 5 MG tablet, Take 1 tablet (5 mg total) by mouth 3 (three) times daily with meals.   rosuvastatin  (CRESTOR ) 40 MG tablet, Take 40 mg by mouth daily.   tadalafil  (CIALIS ) 20 MG tablet, Take 1 tablet (20 mg total) by mouth daily.   torsemide  (DEMADEX ) 20 MG tablet, Take 3 tablets (60 mg total) by mouth every other day alternating with 2 tablets (40 mg total) every other day.   Current Outpatient Medications (Analgesics):    acetaminophen  (TYLENOL ) 325 MG tablet, Take 1-2 tablets (325-650 mg total) by mouth every 4 (four) hours as needed for mild pain (pain score 1-3).  Current Outpatient Medications (Hematological):    apixaban  (ELIQUIS ) 5 MG TABS tablet, Take 1 tablet (5 mg total) by mouth 2 (two) times daily.   clopidogrel  (PLAVIX ) 75 MG tablet, Take 1 tablet (75 mg total) by mouth daily.  Current Outpatient Medications (Other):    linaclotide  (LINZESS ) 145 MCG CAPS capsule, Take 1 capsule (145 mcg total) by mouth daily before breakfast.   melatonin 5 MG TABS, Take 5 mg by mouth at bedtime as needed (sleep).   pantoprazole  (PROTONIX ) 40 MG tablet, Take 1 tablet (40 mg total) by mouth 2 (two) times daily before a meal.   potassium chloride  SA (KLOR-CON  M) 20 MEQ tablet, Take 3 tablets (60 meq) daily when taking torsemide  60 mg. Take 2 tablets (40 meq) daily when taking torsemide  40 mg.   Vitamin D-Vitamin K (VITAMIN K2-VITAMIN D3 PO), Take 1 tablet by mouth daily with lunch.  Medical History:  Past Medical History:   Diagnosis Date   Acute ST elevation myocardial infarction (STEMI) of anterior wall (HCC) 12/10/2022   Acute ST elevation myocardial infarction (STEMI) of inferior wall (HCC) 10/30/2021   Atrial fibrillation (HCC)    Atrial fibrillation with RVR (HCC) 12/19/2022   CHF (congestive heart failure) (HCC)    Coronary artery disease    Diabetes mellitus without complication (HCC)    DM (diabetes mellitus) (HCC)    H. pylori infection    Heart attack (HCC)    HLD (hyperlipidemia)    Hypertension    ST elevation myocardial infarction involving left anterior descending (LAD) coronary artery (HCC) 12/10/2022   Allergies:  Allergies  Allergen Reactions   Beef-Derived Drug Products    Pork-Derived Products Other (See Comments)    Religious observance     Surgical History:  He  has a past surgical history that includes Coronary/Graft Acute MI Revascularization (N/A, 10/30/2021); LEFT HEART CATH AND CORONARY ANGIOGRAPHY (N/A, 10/30/2021); Coronary/Graft Acute MI Revascularization (N/A, 12/10/2022); LEFT HEART CATH AND CORONARY ANGIOGRAPHY (N/A, 12/10/2022); CARDIOVERSION (N/A, 12/14/2022); RIGHT HEART CATH (N/A, 03/20/2023); Esophagogastroduodenoscopy (N/A, 03/23/2023); ICD IMPLANT (N/A, 06/01/2023); and RIGHT HEART CATH (N/A, 06/30/2023). Family History:  His family history includes Chronic Renal Failure in his brother and father; Diabetes in his brother, brother, father, and mother; Heart attack in his brother; Stroke in his brother.  REVIEW OF SYSTEMS  : All other systems reviewed and negative except where noted in the History  of Present Illness.  PHYSICAL EXAM: BP (!) 90/58 (BP Location: Left Arm, Patient Position: Sitting)   Pulse 72   Ht 5' 9 (1.753 m)   Wt 144 lb 4 oz (65.4 kg)   BMI 21.30 kg/m  Physical Exam   GENERAL APPEARANCE: thin appearing,  in no apparent distress. HEENT: No cervical lymphadenopathy, unremarkable thyroid , sclerae anicteric, conjunctiva pink. RESPIRATORY:  Respiratory effort normal, breath sounds clear to auscultation bilaterally without rales, rhonchi, or wheezing. CARDIO: Regular rhythm with occasional extra beats, no murmurs, rubs, or gallops, peripheral pulses intact. AICD left upper chest.  ABDOMEN: Soft, non-distended, active bowel sounds in all four quadrants, mild tenderness RUQ with + carnett, umbilical hernia present, no rebound, no mass appreciated. RECTAL: Declines. MUSCULOSKELETAL: Full range of motion, normal gait, without edema. SKIN: Dry, intact without rashes or lesions. No jaundice. NEURO: Alert, oriented, no focal deficits. PSYCH: Cooperative, normal mood and affect.      Alan JONELLE Coombs, PA-C 10:59 AM

## 2023-09-07 ENCOUNTER — Ambulatory Visit: Payer: Self-pay | Admitting: Physician Assistant

## 2023-09-07 ENCOUNTER — Ambulatory Visit (INDEPENDENT_AMBULATORY_CARE_PROVIDER_SITE_OTHER)

## 2023-09-07 ENCOUNTER — Other Ambulatory Visit (INDEPENDENT_AMBULATORY_CARE_PROVIDER_SITE_OTHER)

## 2023-09-07 ENCOUNTER — Other Ambulatory Visit (HOSPITAL_COMMUNITY): Payer: Self-pay

## 2023-09-07 ENCOUNTER — Encounter: Payer: Self-pay | Admitting: Physician Assistant

## 2023-09-07 ENCOUNTER — Other Ambulatory Visit: Payer: Self-pay

## 2023-09-07 ENCOUNTER — Ambulatory Visit (INDEPENDENT_AMBULATORY_CARE_PROVIDER_SITE_OTHER): Admitting: Physician Assistant

## 2023-09-07 VITALS — BP 90/58 | HR 72 | Ht 69.0 in | Wt 144.2 lb

## 2023-09-07 DIAGNOSIS — R634 Abnormal weight loss: Secondary | ICD-10-CM

## 2023-09-07 DIAGNOSIS — R1011 Right upper quadrant pain: Secondary | ICD-10-CM | POA: Diagnosis not present

## 2023-09-07 DIAGNOSIS — E538 Deficiency of other specified B group vitamins: Secondary | ICD-10-CM

## 2023-09-07 DIAGNOSIS — R748 Abnormal levels of other serum enzymes: Secondary | ICD-10-CM

## 2023-09-07 DIAGNOSIS — I5022 Chronic systolic (congestive) heart failure: Secondary | ICD-10-CM

## 2023-09-07 DIAGNOSIS — D509 Iron deficiency anemia, unspecified: Secondary | ICD-10-CM

## 2023-09-07 DIAGNOSIS — K5900 Constipation, unspecified: Secondary | ICD-10-CM | POA: Diagnosis not present

## 2023-09-07 DIAGNOSIS — I4891 Unspecified atrial fibrillation: Secondary | ICD-10-CM | POA: Diagnosis not present

## 2023-09-07 DIAGNOSIS — I2510 Atherosclerotic heart disease of native coronary artery without angina pectoris: Secondary | ICD-10-CM

## 2023-09-07 LAB — IBC + FERRITIN
Ferritin: 26.3 ng/mL (ref 22.0–322.0)
Iron: 28 ug/dL — ABNORMAL LOW (ref 42–165)
Saturation Ratios: 6.5 % — ABNORMAL LOW (ref 20.0–50.0)
TIBC: 432.6 ug/dL (ref 250.0–450.0)
Transferrin: 309 mg/dL (ref 212.0–360.0)

## 2023-09-07 LAB — GAMMA GT: GGT: 132 U/L — ABNORMAL HIGH (ref 7–51)

## 2023-09-07 LAB — CBC WITH DIFFERENTIAL/PLATELET
Basophils Absolute: 0 K/uL (ref 0.0–0.1)
Basophils Relative: 1.2 % (ref 0.0–3.0)
Eosinophils Absolute: 0.1 K/uL (ref 0.0–0.7)
Eosinophils Relative: 2.5 % (ref 0.0–5.0)
HCT: 40.5 % (ref 39.0–52.0)
Hemoglobin: 12.9 g/dL — ABNORMAL LOW (ref 13.0–17.0)
Lymphocytes Relative: 39.1 % (ref 12.0–46.0)
Lymphs Abs: 1.6 K/uL (ref 0.7–4.0)
MCHC: 31.9 g/dL (ref 30.0–36.0)
MCV: 84.7 fl (ref 78.0–100.0)
Monocytes Absolute: 0.7 K/uL (ref 0.1–1.0)
Monocytes Relative: 16.6 % — ABNORMAL HIGH (ref 3.0–12.0)
Neutro Abs: 1.6 K/uL (ref 1.4–7.7)
Neutrophils Relative %: 40.6 % — ABNORMAL LOW (ref 43.0–77.0)
Platelets: 222 K/uL (ref 150.0–400.0)
RBC: 4.78 Mil/uL (ref 4.22–5.81)
RDW: 14.5 % (ref 11.5–15.5)
WBC: 4 K/uL (ref 4.0–10.5)

## 2023-09-07 LAB — HEPATIC FUNCTION PANEL
ALT: 20 U/L (ref 0–53)
AST: 25 U/L (ref 0–37)
Albumin: 4.2 g/dL (ref 3.5–5.2)
Alkaline Phosphatase: 116 U/L (ref 39–117)
Bilirubin, Direct: 0.2 mg/dL (ref 0.0–0.3)
Total Bilirubin: 0.7 mg/dL (ref 0.2–1.2)
Total Protein: 8 g/dL (ref 6.0–8.3)

## 2023-09-07 LAB — VITAMIN B12: Vitamin B-12: 918 pg/mL — ABNORMAL HIGH (ref 211–911)

## 2023-09-07 NOTE — Progress Notes (Signed)
 I agree with the assessment and plan as outlined by Ms. Craig.

## 2023-09-07 NOTE — Patient Instructions (Addendum)
 Your provider has requested that you go to the basement level for lab work before leaving today. Press B on the elevator. The lab is located at the first door on the left as you exit the elevator.  Due to recent changes in healthcare laws, you may see the results of your imaging and laboratory studies on MyChart before your provider has had a chance to review them.  We understand that in some cases there may be results that are confusing or concerning to you. Not all laboratory results come back in the same time frame and the provider may be waiting for multiple results in order to interpret others.  Please give us  48 hours in order for your provider to thoroughly review all the results before contacting the office for clarification of your results.    You have been scheduled for an abdominal ultrasound at [facility] Radiology (1st floor of hospital) on Wednesday, 09/27/23 at 7:00 am. Please arrive 15 minutes prior to your appointment for registration. Make certain not to have anything to eat or drink 6 after midnight prior to your appointment. Should you need to reschedule your appointment, please contact radiology at [phone]. This test typically takes about 30 minutes to perform.   You have been scheduled for a gastric emptying scan at [facility] on Wednesday, 09/27/23 at 8:00 am. Please arrive at least 30 minutes prior to your appointment for registration. Please make certain not to have anything to eat or drink after midnight the night before your test. Hold all stomach medications (ex: Zofran , phenergan, Reglan) 24 hours prior to your test. If you need to reschedule your appointment, please contact radiology scheduling at [phone]. _____________________________________________________________________ A gastric-emptying study measures how long it takes for food to move through your stomach. There are several ways to measure stomach emptying. In the most common test, you eat food  that contains a small amount of radioactive material. A scanner that detects the movement of the radioactive material is placed over your abdomen to monitor the rate at which food leaves your stomach. This test normally takes about 4 hours to complete. _____________________________________________________________________   VISIT SUMMARY:  You came in today due to ongoing gastrointestinal symptoms, including constipation, abdominal discomfort, and a new pain under your ribs. We discussed your history of a heart attack, current medications, and recent weight loss. We reviewed your recent tests and symptoms to develop a plan to address your concerns.  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 A hiatal hernia occurs when part of the stomach pushes up through the diaphragm. We will order an abdominal ultrasound to check your liver and gallbladder. You should eat small, frequent meals and softer foods. We may consider a gastric emptying study if dietary changes do not help.  -LOSS OF APPETITE AND NAUSEA: Your loss of appetite and nausea may be related to gastroparesis. Despite taking mirtazapine , your appetite issues persist. Please follow the dietary changes we discussed for gastroparesis. If your symptoms continue, we may need to do a gastric emptying study.  -CHRONIC CONSTIPATION WITH INCOMPLETE EVACUATION AND BLOATING: Chronic constipation can cause discomfort and bloating. Linzess  has helped with your bowel movements, but stools are still not ideal. Continue taking Linzess  145 mcg daily and add Benefiber, half to one tablespoon, to improve stool consistency.  INSTRUCTIONS:  Please follow up with the abdominal  ultrasound as discussed. Continue with the dietary changes and medications as advised. If your symptoms persist or  worsen, contact our office. We may need to consider additional tests like a gastric emptying study.   Gastroparesis Gastroparesis is a condition in which food takes longer than normal to empty from the stomach.  This condition is also known as delayed gastric emptying. It is usually a long-term (chronic) condition.  What are the signs or symptoms? Symptoms of this condition include: Feeling full after eating very little or a loss of appetite. Nausea, vomiting, or heartburn. Bloating of your abdomen. Inconsistent blood sugar (glucose) levels on blood tests. Unexplained weight loss. Acid from the stomach coming up into the esophagus (gastroesophageal reflux). Sudden tightening (spasm) of the stomach, which can be painful. Symptoms may come and go. Some people may not notice any symptoms.  What increases the risk? You are more likely to develop this condition if: You have certain disorders or diseases. These may include: An endocrine disorder. An eating disorder. Amyloidosis. Scleroderma. Parkinson's disease. Multiple sclerosis. Cancer or infection of the stomach or the vagus nerve. You have had surgery on your stomach or vagus nerve. You take certain medicines. You are male.  Things you can do: Please do small frequent meals like 4-6 meals a day.  Eat and drink liquids at separate times.  Avoid high fiber foods, cook your vegetables, avoid high fat food.  Suggest spreading protein throughout the day (greek yogurt, glucerna, soft meat, milk, eggs) Choose soft foods that you can mash with a fork When you are more symptomatic, change to pureed foods foods and liquids.  Consider reading Living well with Gastroparesis by [facility] Check out this link to a diet online [email or web address]

## 2023-09-12 ENCOUNTER — Ambulatory Visit: Admitting: General Practice

## 2023-09-12 ENCOUNTER — Encounter: Payer: Self-pay | Admitting: General Practice

## 2023-09-12 VITALS — BP 112/60 | HR 68 | Temp 98.0°F | Ht 69.0 in | Wt 143.0 lb

## 2023-09-12 DIAGNOSIS — D509 Iron deficiency anemia, unspecified: Secondary | ICD-10-CM

## 2023-09-12 DIAGNOSIS — Z23 Encounter for immunization: Secondary | ICD-10-CM

## 2023-09-12 DIAGNOSIS — G8929 Other chronic pain: Secondary | ICD-10-CM

## 2023-09-12 DIAGNOSIS — E7800 Pure hypercholesterolemia, unspecified: Secondary | ICD-10-CM

## 2023-09-12 DIAGNOSIS — E118 Type 2 diabetes mellitus with unspecified complications: Secondary | ICD-10-CM | POA: Diagnosis not present

## 2023-09-12 DIAGNOSIS — Z7984 Long term (current) use of oral hypoglycemic drugs: Secondary | ICD-10-CM | POA: Diagnosis not present

## 2023-09-12 DIAGNOSIS — R109 Unspecified abdominal pain: Secondary | ICD-10-CM

## 2023-09-12 DIAGNOSIS — R748 Abnormal levels of other serum enzymes: Secondary | ICD-10-CM | POA: Diagnosis not present

## 2023-09-12 DIAGNOSIS — E039 Hypothyroidism, unspecified: Secondary | ICD-10-CM

## 2023-09-12 DIAGNOSIS — I9589 Other hypotension: Secondary | ICD-10-CM

## 2023-09-12 NOTE — Patient Instructions (Signed)
 Follow up in 3 months for diabetes and cholesterol labs.   It was a pleasure to see you today!

## 2023-09-12 NOTE — Progress Notes (Signed)
 Established Patient Office Visit  Subjective   Patient ID: Garrett Roberts, male    DOB: 1955-12-03  Age: 68 y.o. MRN: [id number]  Chief Complaint  Patient presents with   chronic care management    HPI  Garrett Roberts is a 68 year old male with past medical history of CAD, HTN, A fib, CHF, chronic hypotension, GERD, type 2 DM, hypothyroidism, CKD, HLD presents today for a follow up.  Discussed the use of AI scribe software for clinical note transcription with the patient, who gave verbal consent to proceed.  History of Present Illness He was recently found to have elevated gamma-glutamyl transferase (GGT) levels and was asked to follow up with GI. He had a repeat GGT level with a recent decrease from 162 to 132. liver function tests have otherwise been normal. He has ongoing abdominal pain, decreased appetite and constipation. He was recently started on Linzess  by GI. He reports that he has a BM 3-4 times a week. He does report slightly improvement in abdominal pain since starting Linzess . He is scheduled for an abdominal ultrasound and gastric emptying testing on 09/27/23.  He has diabetes, with a hemoglobin A1c of 7.0 in June, improved from 7.8 previously. He is currently on Jardiance  for diabetes management. His eye and foot exams are up to date.  He is on ezetimibe  for cholesterol management, which was last checked in June and reported as good. He experiences episodes of low blood pressure, with a recent reading of 73/45 at home, and is on midodrine  three times a day. He manages these episodes by consuming salty foods or drinks.  He has hypothyroidism, with a recent increase in medication from 88 mcg to 100 mcg. An ultrasound revealed a small thyroid  nodule. His thyroid  levels were last checked in August, and he is due for a recheck in the coming weeks. He is following with endocrinology.    Patient Active Problem List   Diagnosis Date Noted   Iron deficiency anemia  09/12/2023   Chronic abdominal pain 09/01/2023   Elevated serum GGT level 09/01/2023   Epigastric pain 07/12/2023   HFrEF (heart failure with reduced ejection fraction) (HCC) 06/30/2023   Recurrent right pleural effusion 06/30/2023   Chronic hypotension 06/30/2023   Renal insufficiency 06/30/2023   Hypothyroidism 06/30/2023   GERD (gastroesophageal reflux disease) 06/30/2023   Presence of heart assist device (HCC) 06/12/2023   Chronic systolic heart failure (HCC) 06/01/2023   Poor appetite 04/19/2023   Hospital discharge follow-up 04/05/2023   Subclinical hypothyroidism 04/05/2023   Loss of weight 03/23/2023   Protein-calorie malnutrition, severe 03/22/2023   Acute on chronic systolic CHF (congestive heart failure) (HCC) 02/17/2023   Ischemic cardiomyopathy 12/19/2022   Chronic HFrEF (heart failure with reduced ejection fraction) (HCC) 12/19/2022   CKD (chronic kidney disease), 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   Type 2 diabetes mellitus with complication, without long-term current use of insulin  (HCC) 12/15/2022   Atrial fibrillation (HCC) 12/14/2022   Constipation 12/12/2022   Left sided abdominal pain 12/11/2022   Abnormal finding on GI tract imaging 12/11/2022   CAD (coronary artery disease) 10/11/2022   Essential hypertension 10/11/2022   Hyperlipidemia 10/11/2022   Vitamin D deficiency 08/03/2015   Past Medical History:  Diagnosis Date   Acute ST elevation myocardial infarction (STEMI) of anterior wall (HCC) 12/10/2022   Acute ST elevation myocardial infarction (STEMI) of inferior wall (HCC) 10/30/2021   Atrial fibrillation (HCC)    Atrial fibrillation with RVR (HCC) 12/19/2022   CHF (congestive heart  failure) (HCC)    Coronary artery disease    Diabetes mellitus without complication (HCC)    DM (diabetes mellitus) (HCC)    H. pylori infection    Heart attack (HCC)    HLD (hyperlipidemia)    Hypertension    ST elevation myocardial infarction involving left anterior descending  (LAD) coronary artery (HCC) 12/10/2022   Past Surgical History:  Procedure Laterality Date   CARDIOVERSION N/A 12/14/2022   Procedure: CARDIOVERSION;  Surgeon: Kate Lonni CROME, MD;  Location: [facility] INVASIVE CV LAB;  Service: Cardiovascular;  Laterality: N/A;   CORONARY/GRAFT ACUTE MI REVASCULARIZATION N/A 10/30/2021   Procedure: Coronary/Graft Acute MI Revascularization;  Surgeon: Ladona Heinz, MD;  Location: [facility] INVASIVE CV LAB;  Service: Cardiovascular;  Laterality: N/A;   CORONARY/GRAFT ACUTE MI REVASCULARIZATION N/A 12/10/2022   Procedure: Coronary/Graft Acute MI Revascularization;  Surgeon: Wonda Sharper, MD;  Location: [facility] INVASIVE CV LAB;  Service: Cardiovascular;  Laterality: N/A;   ESOPHAGOGASTRODUODENOSCOPY N/A 03/23/2023   Procedure: EGD (ESOPHAGOGASTRODUODENOSCOPY);  Surgeon: Stacia Glendia BRAVO, MD;  Location: [facility] ENDOSCOPY;  Service: Gastroenterology;  Laterality: N/A;   ICD IMPLANT N/A 06/01/2023   Procedure: ICD IMPLANT;  Surgeon: Kennyth Chew, MD;  Location: [facility] INVASIVE CV LAB;  Service: Cardiovascular;  Laterality: N/A;   LEFT HEART CATH AND CORONARY ANGIOGRAPHY N/A 10/30/2021   Procedure: LEFT HEART CATH AND CORONARY ANGIOGRAPHY;  Surgeon: Ladona Heinz, MD;  Location: [facility] INVASIVE CV LAB;  Service: Cardiovascular;  Laterality: N/A;   LEFT HEART CATH AND CORONARY ANGIOGRAPHY N/A 12/10/2022   Procedure: LEFT HEART CATH AND CORONARY ANGIOGRAPHY;  Surgeon: Wonda Sharper, MD;  Location: [facility] INVASIVE CV LAB;  Service: Cardiovascular;  Laterality: N/A;   RIGHT HEART CATH N/A 03/20/2023   Procedure: RIGHT HEART CATH;  Surgeon: Rolan Ezra RAMAN, MD;  Location: [facility] INVASIVE CV LAB;  Service: Cardiovascular;  Laterality: N/A;   RIGHT HEART CATH N/A 06/30/2023   Procedure: RIGHT HEART CATH;  Surgeon: Cherrie Toribio SAUNDERS, MD;  Location: [facility] INVASIVE CV LAB;  Service: Cardiovascular;  Laterality: N/A;   Allergies  Allergen Reactions   Beef-Derived Drug Products    Pork-Derived Products  Other (See Comments)    Religious observance         09/01/2023    3:06 PM 07/12/2023   12:17 PM 07/12/2023   11:37 AM  Depression screen PHQ 2/9  Decreased Interest 0 0 0  Down, Depressed, Hopeless 0 0 0  PHQ - 2 Score 0 0 0  Altered sleeping 0    Tired, decreased energy 0    Change in appetite 0    Feeling bad or failure about yourself  0    Trouble concentrating 0    Moving slowly or fidgety/restless 0    Suicidal thoughts 0    PHQ-9 Score 0    Difficult doing work/chores Not difficult at all  Not difficult at all       06/12/2023   11:09 AM 04/05/2023    2:33 PM  GAD 7 : Generalized Anxiety Score  Nervous, Anxious, on Edge 0 0  Control/stop worrying 0 0  Worry too much - different things 0 0  Trouble relaxing 0 0  Restless 0 0  Easily annoyed or irritable 0 0  Afraid - awful might happen 0 0  Total GAD 7 Score 0 0  Anxiety Difficulty Not difficult at all Not difficult at all      Review of Systems  Constitutional:  Negative for chills and fever.  Respiratory:  Negative for shortness of breath.   Cardiovascular:  Negative for chest pain.  Gastrointestinal:  Negative for abdominal pain, constipation, diarrhea, heartburn, nausea and vomiting.  Genitourinary:  Negative for dysuria, frequency and urgency.  Neurological:  Negative for dizziness and headaches.  Endo/Heme/Allergies:  Negative for polydipsia.  Psychiatric/Behavioral:  Negative for depression and suicidal ideas. The patient is not nervous/anxious.       Objective:     BP 112/60   Pulse 68   Temp 98 F (36.7 C) (Oral)   Ht 5' 9 (1.753 m)   Wt 143 lb (64.9 kg)   SpO2 97%   BMI 21.12 kg/m  BP Readings from Last 3 Encounters:  09/12/23 112/60  09/07/23 (!) 90/58  09/01/23 (!) 108/40   Wt Readings from Last 3 Encounters:  09/12/23 143 lb (64.9 kg)  09/07/23 144 lb 4 oz (65.4 kg)  09/01/23 143 lb (64.9 kg)      Physical Exam Vitals and nursing note reviewed.  Constitutional:       Appearance: Normal appearance.  Cardiovascular:     Rate and Rhythm: Normal rate and regular rhythm.     Pulses: Normal pulses.     Heart sounds: Normal heart sounds.  Pulmonary:     Effort: Pulmonary effort is normal.     Breath sounds: Normal breath sounds.  Neurological:     Mental Status: He is alert and oriented to person, place, and time.  Psychiatric:        Mood and Affect: Mood normal.        Behavior: Behavior normal.        Thought Content: Thought content normal.        Judgment: Judgment normal.      No results found for any visits on 09/12/23.     The ASCVD Risk score (Arnett DK, et al., 2019) failed to calculate for the following reasons:   Risk score cannot be calculated because patient has a medical history suggesting prior/existing ASCVD    Assessment & Plan:  Type 2 diabetes mellitus with complication, without long-term current use of insulin  (HCC)  Pure hypercholesterolemia  Encounter for immunization -     Flu vaccine HIGH DOSE PF(Fluzone Trivalent)  Elevated serum GGT level  Chronic abdominal pain  Chronic hypotension  Hypothyroidism, unspecified type  Iron deficiency anemia, unspecified iron deficiency anemia type    Assessment and Plan Assessment & Plan Chronic abdominal pain and constipation with evaluation for gastroparesis and elevated GGT Chronic abdominal pain with potential gastroparesis, especially with diabetes. Elevated GGT decreased but remains above normal. Liver function normal. Constipation present, Linzess  providing some relief. Differential includes gastroparesis.  - Continue Linzess  once daily for constipation per GI. - reviewed labs and notes from GI.   Type 2 diabetes mellitus Type 2 diabetes mellitus with improved hemoglobin A1c from 7.8 to 7.0. Managed with Jardiance . Eye and foot exams up to date. - Continue Jardiance  for diabetes management. - Plan for diabetes labs in December.  Pure  hypercholesterolemia Cholesterol levels well-managed with current medication regimen.  - Continue current cholesterol management with zetia . - Plan for cholesterol labs in December.  Chronic hypotension Chronic hypotension with recent improvement in blood pressure readings. Home readings show variability, recent low of 73/45. On Midodrine . Advised to increase sodium intake if experiencing symptoms of low blood pressure. - Continue Midodrine  three times daily. - Monitor blood pressure regularly at home. - Bring home blood pressure cuff to next appointment for calibration check.  Hypothyroidism  with nontoxic single thyroid  nodule Hypothyroidism with recent medication increase. Small thyroid  nodule not meeting criteria for malignancy or biopsy.  - Continue current thyroid  medication at 100 mcg daily. - Recheck thyroid  levels in 6-8 weeks. - Monitor thyroid  nodule with ultrasound as needed.  Anemia Anemia noted, potential contribution to constipation if iron supplementation is initiated. - Consider iron supplementation with vitamin C to enhance absorption, if initiated.   Return in about 3 months (around 12/12/2023) for diabetes and cholesterol (same day fasting labs). SABRA Carrol Aurora, NP

## 2023-09-22 DIAGNOSIS — Z419 Encounter for procedure for purposes other than remedying health state, unspecified: Secondary | ICD-10-CM | POA: Diagnosis not present

## 2023-09-26 ENCOUNTER — Other Ambulatory Visit: Payer: Self-pay | Admitting: Physician Assistant

## 2023-09-26 DIAGNOSIS — I2510 Atherosclerotic heart disease of native coronary artery without angina pectoris: Secondary | ICD-10-CM

## 2023-09-26 DIAGNOSIS — K5900 Constipation, unspecified: Secondary | ICD-10-CM

## 2023-09-26 DIAGNOSIS — R1011 Right upper quadrant pain: Secondary | ICD-10-CM

## 2023-09-26 DIAGNOSIS — I4891 Unspecified atrial fibrillation: Secondary | ICD-10-CM

## 2023-09-26 DIAGNOSIS — I5022 Chronic systolic (congestive) heart failure: Secondary | ICD-10-CM

## 2023-09-26 DIAGNOSIS — R748 Abnormal levels of other serum enzymes: Secondary | ICD-10-CM

## 2023-09-26 DIAGNOSIS — R634 Abnormal weight loss: Secondary | ICD-10-CM

## 2023-09-27 ENCOUNTER — Ambulatory Visit (HOSPITAL_COMMUNITY)
Admission: RE | Admit: 2023-09-27 | Discharge: 2023-09-27 | Disposition: A | Discharge status: Home / Self Care | Source: Ambulatory Visit | Attending: Physician Assistant | Admitting: Physician Assistant

## 2023-09-27 ENCOUNTER — Encounter (HOSPITAL_COMMUNITY)
Admission: RE | Admit: 2023-09-27 | Discharge: 2023-09-27 | Disposition: A | Discharge status: Home / Self Care | Source: Ambulatory Visit | Attending: Physician Assistant | Admitting: Physician Assistant

## 2023-09-27 DIAGNOSIS — R634 Abnormal weight loss: Secondary | ICD-10-CM | POA: Insufficient documentation

## 2023-09-27 DIAGNOSIS — R1011 Right upper quadrant pain: Secondary | ICD-10-CM | POA: Diagnosis not present

## 2023-09-27 MED ORDER — TECHNETIUM TC 99M SULFUR COLLOID
2.2000 | Freq: Once | INTRAVENOUS | Status: AC | PRN
Start: 2023-09-27 — End: 2023-09-27
  Administered 2023-09-27: 2.2 via ORAL

## 2023-10-02 ENCOUNTER — Ambulatory Visit: Payer: Self-pay | Admitting: Physician Assistant

## 2023-10-03 ENCOUNTER — Encounter (HOSPITAL_COMMUNITY)

## 2023-10-06 ENCOUNTER — Other Ambulatory Visit (HOSPITAL_COMMUNITY): Payer: Self-pay

## 2023-10-09 ENCOUNTER — Other Ambulatory Visit: Payer: Self-pay

## 2023-10-13 ENCOUNTER — Encounter (HOSPITAL_COMMUNITY): Admitting: Cardiology

## 2023-10-13 ENCOUNTER — Ambulatory Visit: Admitting: Physician Assistant

## 2023-10-18 ENCOUNTER — Ambulatory Visit (INDEPENDENT_AMBULATORY_CARE_PROVIDER_SITE_OTHER)

## 2023-10-18 ENCOUNTER — Other Ambulatory Visit (HOSPITAL_COMMUNITY): Payer: Self-pay

## 2023-10-18 DIAGNOSIS — Z95811 Presence of heart assist device: Secondary | ICD-10-CM

## 2023-10-19 ENCOUNTER — Encounter (HOSPITAL_COMMUNITY)

## 2023-10-19 ENCOUNTER — Other Ambulatory Visit (HOSPITAL_COMMUNITY): Payer: Self-pay

## 2023-10-19 LAB — CUP PACEART REMOTE DEVICE CHECK
Date Time Interrogation Session: 20251008092106
Implantable Lead Connection Status: 753985
Implantable Lead Implant Date: 20250522
Implantable Lead Location: 753860
Implantable Lead Serial Number: 81628712
Implantable Pulse Generator Implant Date: 20250522
Pulse Gen Model: 429525
Pulse Gen Serial Number: 88734280

## 2023-10-20 ENCOUNTER — Other Ambulatory Visit (HOSPITAL_COMMUNITY): Payer: Self-pay

## 2023-10-20 ENCOUNTER — Ambulatory Visit (HOSPITAL_COMMUNITY)
Admission: RE | Admit: 2023-10-20 | Discharge: 2023-10-20 | Disposition: A | Discharge status: Home / Self Care | Source: Ambulatory Visit | Attending: Cardiology | Admitting: Cardiology

## 2023-10-20 ENCOUNTER — Encounter (HOSPITAL_COMMUNITY): Payer: Self-pay | Admitting: Unknown Physician Specialty

## 2023-10-20 VITALS — BP 94/70 | HR 67 | Wt 143.0 lb

## 2023-10-20 DIAGNOSIS — I110 Hypertensive heart disease with heart failure: Secondary | ICD-10-CM | POA: Diagnosis not present

## 2023-10-20 DIAGNOSIS — J90 Pleural effusion, not elsewhere classified: Secondary | ICD-10-CM | POA: Insufficient documentation

## 2023-10-20 DIAGNOSIS — E039 Hypothyroidism, unspecified: Secondary | ICD-10-CM | POA: Diagnosis not present

## 2023-10-20 DIAGNOSIS — E119 Type 2 diabetes mellitus without complications: Secondary | ICD-10-CM | POA: Diagnosis not present

## 2023-10-20 DIAGNOSIS — Z7901 Long term (current) use of anticoagulants: Secondary | ICD-10-CM | POA: Insufficient documentation

## 2023-10-20 DIAGNOSIS — Z7902 Long term (current) use of antithrombotics/antiplatelets: Secondary | ICD-10-CM | POA: Insufficient documentation

## 2023-10-20 DIAGNOSIS — I255 Ischemic cardiomyopathy: Secondary | ICD-10-CM | POA: Insufficient documentation

## 2023-10-20 DIAGNOSIS — Z95810 Presence of automatic (implantable) cardiac defibrillator: Secondary | ICD-10-CM | POA: Diagnosis not present

## 2023-10-20 DIAGNOSIS — Z955 Presence of coronary angioplasty implant and graft: Secondary | ICD-10-CM | POA: Diagnosis not present

## 2023-10-20 DIAGNOSIS — I2729 Other secondary pulmonary hypertension: Secondary | ICD-10-CM | POA: Insufficient documentation

## 2023-10-20 DIAGNOSIS — Z7984 Long term (current) use of oral hypoglycemic drugs: Secondary | ICD-10-CM | POA: Insufficient documentation

## 2023-10-20 DIAGNOSIS — I5022 Chronic systolic (congestive) heart failure: Secondary | ICD-10-CM | POA: Diagnosis not present

## 2023-10-20 DIAGNOSIS — I2510 Atherosclerotic heart disease of native coronary artery without angina pectoris: Secondary | ICD-10-CM | POA: Diagnosis not present

## 2023-10-20 DIAGNOSIS — I480 Paroxysmal atrial fibrillation: Secondary | ICD-10-CM | POA: Diagnosis not present

## 2023-10-20 DIAGNOSIS — R6881 Early satiety: Secondary | ICD-10-CM | POA: Insufficient documentation

## 2023-10-20 DIAGNOSIS — D509 Iron deficiency anemia, unspecified: Secondary | ICD-10-CM | POA: Diagnosis not present

## 2023-10-20 DIAGNOSIS — I252 Old myocardial infarction: Secondary | ICD-10-CM | POA: Diagnosis not present

## 2023-10-20 LAB — BRAIN NATRIURETIC PEPTIDE: B Natriuretic Peptide: 1886.7 pg/mL — ABNORMAL HIGH (ref 0.0–100.0)

## 2023-10-20 LAB — COMPREHENSIVE METABOLIC PANEL WITH GFR
ALT: 26 U/L (ref 0–44)
AST: 29 U/L (ref 15–41)
Albumin: 4.1 g/dL (ref 3.5–5.0)
Alkaline Phosphatase: 106 U/L (ref 38–126)
Anion gap: 13 (ref 5–15)
BUN: 23 mg/dL (ref 8–23)
CO2: 22 mmol/L (ref 22–32)
Calcium: 9.7 mg/dL (ref 8.9–10.3)
Chloride: 100 mmol/L (ref 98–111)
Creatinine, Ser: 1.3 mg/dL — ABNORMAL HIGH (ref 0.61–1.24)
GFR, Estimated: >60 mL/min (ref >60)
Glucose, Bld: 150 mg/dL — ABNORMAL HIGH (ref 70–99)
Potassium: 4.4 mmol/L (ref 3.5–5.1)
Sodium: 135 mmol/L (ref 135–145)
Total Bilirubin: 1.1 mg/dL (ref 0.0–1.2)
Total Protein: 8.6 g/dL — ABNORMAL HIGH (ref 6.5–8.1)

## 2023-10-20 LAB — CBC
HCT: 41.7 % (ref 39.0–52.0)
Hemoglobin: 12.9 g/dL — ABNORMAL LOW (ref 13.0–17.0)
MCH: 25.2 pg — ABNORMAL LOW (ref 26.0–34.0)
MCHC: 30.9 g/dL (ref 30.0–36.0)
MCV: 81.4 fL (ref 80.0–100.0)
Platelets: 309 K/uL (ref 150–400)
RBC: 5.12 MIL/uL (ref 4.22–5.81)
RDW: 15.4 % (ref 11.5–15.5)
WBC: 4.6 K/uL (ref 4.0–10.5)
nRBC: 0 % (ref 0.0–0.2)

## 2023-10-20 LAB — TSH: TSH: 7.974 u[IU]/mL — ABNORMAL HIGH (ref 0.350–4.500)

## 2023-10-20 LAB — IRON AND TIBC
Iron: 27 ug/dL — ABNORMAL LOW (ref 45–182)
Saturation Ratios: 6 % — ABNORMAL LOW (ref 17.9–39.5)
TIBC: 490 ug/dL — ABNORMAL HIGH (ref 250–450)
UIBC: 463 ug/dL

## 2023-10-20 LAB — VITAMIN B12: Vitamin B-12: 921 pg/mL — ABNORMAL HIGH (ref 180–914)

## 2023-10-20 LAB — FERRITIN: Ferritin: 19 ng/mL — ABNORMAL LOW (ref 24–336)

## 2023-10-20 LAB — DIGOXIN LEVEL: Digoxin Level: <0.6 ng/mL — ABNORMAL LOW (ref 0.8–2.0)

## 2023-10-20 MED ORDER — TORSEMIDE 20 MG PO TABS
60.0000 mg | ORAL_TABLET | Freq: Every day | ORAL | 6 refills | Status: DC
  Filled 2023-10-20: qty 180, 60d supply, fill #0
  Filled ????-??-??: fill #1

## 2023-10-20 MED ORDER — POTASSIUM CHLORIDE CRYS ER 20 MEQ PO TBCR
60.0000 meq | EXTENDED_RELEASE_TABLET | Freq: Every day | ORAL | 6 refills | Status: DC
  Filled 2023-10-20: qty 180, 60d supply, fill #0
  Filled ????-??-??: fill #1

## 2023-10-20 NOTE — Progress Notes (Signed)
 Patient presents for 2 month f/u in [facility] today alone.   Pt slowly ambulated into clinic today. He did not come for his CPX test yesterday. Pt tells me he did not come because he slipped and fell in the shower last week and hurt his left knee. Pt tells me today that he has good and bad days. He states that he is sleeping on 3-4 pillows. He state that he has no appetite and eats very little.  States he is able to go upstairs at his house, but he needs to rest when he gets to the top because his legs are weak. Pt has swelling in his feet/legs and abdomen today. Pt tells me that at home sometimes he feels dizzy and when he takes his BP the top number is in the 70s. Pt states that he will eat a piece of salty cheese. He states that this helps his BP but then his feet and legs swell.  Pt has been wanting to hold off on the VAD implant per our conversation at his last visit. Pt had a long conversation with Dr Mclean about this. Pt is currently taking Midodrine  5 tid. His BP remains low. Pt was informed that he is end stage heart failure and his options medically have been exhausted. A VAD pt spent time with him in clinic today and answered all his questions about living life with a VAD. Garrett Roberts would like to proceed with VAD implant. I have reached out to Dr Harden nurse to get this scheduled. In the mean time we will see the pt back in 2 weeks. Pt was informed that if his family has a lot of questions he needs to bring them to this visit.   Vital Signs:  HR: 67 BP: 94/70 (79) SPO2: 100%   Weight: 143 lb  Last weight: 143.8 lb   Symptom YES NO DETAILS  Angina  x Activity:  Claudication  x How Far:  Syncope  x When:  Stroke  x   Orthopnea x  How many pillows: 3-4  PND  x How often:  CPAP  x How many hours:  Pedal Edema x  daily  Abdominal Fullness x  daily  Nausea / Vomit x  No appetite eating very little  Diaphoresis  x When:  Shortness of Breath  x Activity:  Palpitations  x When:   ICD shock  x   Bleeding S/S  x   Tea-colored Urine  x   Hospitalizations  x   Emergency Room  x   Other MD     Activity   Fluid   Diet     Device: Biotronik Therapies: on  Last check: implanted 05/2023  Patient Instructions:  Increase Torsemide  60 mg daily (3 tablets) Increase Potassium to 60 mEq daily (3 tablets) We will work on getting you scheduled for surgery Return to clinic in 2 weeks to see DR Rolan Lauraine Ip RN VAD Coordinator  Office: [phone]  24/7 Pager: [phone]

## 2023-10-20 NOTE — Progress Notes (Signed)
 Remote ICD Transmission

## 2023-10-20 NOTE — Progress Notes (Signed)
 10/23/2023 Garrett Roberts [id number] 07/11/1955  Referring provider: Vincente Shivers, NP Primary GI doctor: Dr. 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 pylori negative malignancy did show focal intestinal metaplasia on no recommendation for routine surveillance CTAP March 2025 in the hospital unremarkable With symptoms and IDA, last colon 10 years ago, some concern for malignancy however patient likely too high risk for colonoscopy  -will plan on virtual colonoscopy to rule out mass, if this is negative continue with supportive care, could consider VCE If this is positive have a conversation about having colonoscopy at [facility] versus palliative care - getting IV iron - consider referral to hematology for supportive care  DA with constipation with discomfort, bloating, weight loss No melena, no hematochezia 11/06/2013 colonoscopy at [facility] for abdominal discomfort and lower abdominal pain good preparation  unremarkable recall 10 years 03/23/2023 EGD 4 cm HH normal esophagus granular gastric mucosa normal duodenum negative H. 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  S/p Biotronik ICD 5/25. 06/2023 EF 20-25%, no AS Getting RHC and being evaluated for LVAD with Dr. Vergil Patient is seeking out second opinion at [facility]  Isolated elevated GGT, RUQ pain CT abdomen pelvis March 2025 in the hospital with normal liver 09/27/2023 RUQ US  liver unremarkable suspected minimal gallbladder sludge negative Murphy CBD 5 mm small right pleural effusion 03/23/2023 EGD 4 cm HH normal esophagus granular gastric mucosa normal duodenum negative H. pylori negative malignancy did show focal intestinal metaplasia on no recommendation for routine surveillance 09/27/2023 GES normal    Latest Ref Rng & Units 10/20/2023    1:25 PM 09/07/2023    9:51 AM 08/29/2023   12:00 AM  Hepatic Function  Total Protein 6.5 - 8.1 g/dL 8.6  8.0    Albumin  3.5 - 5.0 g/dL 4.1  4.2    AST 15 - 41 U/L 29  25    ALT 0 - 44 U/L 26  20    Alk Phosphatase 38 - 126 U/L 106  116    Total Bilirubin 0.0 - 1.2 mg/dL 1.1  0.7    Bilirubin, Direct 0.0 - 0.3 mg/dL  0.2  9.81         This result is from an external source.   Platelets 237.0  INR 07/04/2023 2.1  GGT elevation normal LFTs unremarkable ultrasound Negative hepatitis panel 2025 Likely secondary to CHF/medications  History of H pylori, weight loss, decreased appetite possibly from acute illness/cardiac 03/2023 CT found to have questionable gastric thickening history of H. pylori infection stool test treated with quadruple therapy 03/23/2023 EGD Dr. Stacia  for abnormal CT showed normal esophagus, 4 cm HH, granular gastric mucosa normal duodenum no evidence of malignancy.   Path negative H. pylori negative dysplasia and malignancy did show mild chronic gastritis and focal intestinal metaplasia, no recommendation for routine surveillance 04/2023 started on mirtazapine  50 mg at  bedtime Continue pantoprazole  40 mg twice daily, denies GERD No plan to repeat EGD  CAD 10/2021 status post PCI RCA STEMI 12/10/22. Cardiac cath with 100% proximal LAD treated with PCI/DES. Prior RCA stent patent, severe disease in tortuous ramus and distal LCx treated medically.  On Plavix  Ideally can consider colonoscopy 12/10/2023 Has OV with Dr. Rolan 10/3, will plan for OV after that to obtain cardiac clearance and permission to hold eliquis /plavix  for colonoscopy 1 year after sent some time 12/2023 or 01/2024  A-fib On Eliquis   Transudative pleural effusion 03/2023 He had a moderate right pleural effusion, underwent right thoracentesis. Fluid was transudative but grew E coli, he was treated with Bactrim  based on sensitivities.   Wt Readings from Last 10 Encounters:  10/23/23 141 lb (64 kg)  10/20/23 143 lb (64.9 kg)  09/12/23 143 lb (64.9 kg)  09/07/23 144 lb 4 oz (65.4 kg)  09/01/23 143 lb (64.9 kg)  09/01/23 142 lb (64.4 kg)  08/18/23 144 lb (65.3 kg)  08/17/23 142 lb (64.4 kg)  07/19/23 141 lb 12.8 oz (64.3 kg)  07/12/23 139 lb (63 kg)    Patient Care Team: Vincente Shivers, NP as PCP - General (General Practice) Elmira Newman PARAS, MD as PCP - Cardiology (Cardiology) Kennyth Chew, MD as PCP - Electrophysiology (Cardiology)  HISTORY OF PRESENT ILLNESS: 68 y.o. male with a past medical history coronary artery disease status post non-STEMI 2023 PCI DES to RCA, heart failure ejection fraction 40 to 45%, diabetes hypertension and others listed below presents for follow-up for constipation.  Last seen in the office by myself 08/18/2023 for constipation given Linzess  samples with follow-up, plan for colonoscopy post 1 year stent placement in November/December I last saw the patient in the office 09/07/2023 for abnormal CT  Discussed the use of AI scribe software for clinical note transcription with the patient, who gave verbal consent to proceed.  History of Present  Illness   Garrett Roberts is a 69 year old male who presents for evaluation of gastrointestinal symptoms.  He experiences constipation with episodes of abdominal pain and discomfort, describing a sensation as if something is blocking his abdomen. He uses Linzess  on an as-needed basis, typically when he skips bowel movements for three to four days. Daily use of Linzess  145 mcg results in diarrhea, so he prefers not to take it daily. He sometimes uses other medications or drinks to aid bowel movements.  He has a poor appetite and reports weight fluctuations, with his weight recently decreasing from 144 lbs to 141 lbs. He experiences nausea but no vomiting, and sometimes food comes back up after eating. His appetite has been poor for about ten weeks.  He is aware of his low iron levels, confirmed by a blood test two days ago, but is not currently on oral iron supplements. He denies any black stool or visible blood in the stool.  No breathing difficulties, shortness of breath, or issues with physical activity such as climbing stairs. No abdominal distension, difficulty swallowing, or issues with passing gas.  He experiences swelling, particularly in the legs, and notes that his blood pressure tends to be low, causing dizziness. He manages this by consuming salty foods to  raise his blood pressure.  He is currently taking mirtazapine  at night and is on Eliquis  and Plavix . He has not had a colonoscopy since 2015, which was normal at that time.      Wt Readings from Last 20 Encounters:  10/23/23 141 lb (64 kg)  10/20/23 143 lb (64.9 kg)  09/12/23 143 lb (64.9 kg)  09/07/23 144 lb 4 oz (65.4 kg)  09/01/23 143 lb (64.9 kg)  09/01/23 142 lb (64.4 kg)  08/18/23 144 lb (65.3 kg)  08/17/23 142 lb (64.4 kg)  07/19/23 141 lb 12.8 oz (64.3 kg)  07/12/23 139 lb (63 kg)  07/12/23 139 lb 9.6 oz (63.3 kg)  07/06/23 143 lb (64.9 kg)  07/05/23 141 lb 11.2 oz (64.3 kg)  06/16/23 150 lb (68 kg)   06/13/23 148 lb (67.1 kg)  06/12/23 145 lb (65.8 kg)  06/01/23 142 lb 3.2 oz (64.5 kg)  05/23/23 148 lb 9.6 oz (67.4 kg)  05/02/23 152 lb 12.8 oz (69.3 kg)  04/21/23 147 lb 3.2 oz (66.8 kg)     He  reports that he has never smoked. He has never used smokeless tobacco. He reports that he does not drink alcohol and does not use drugs.  RELEVANT GI HISTORY, IMAGING AND LABS: Results   LABS Iron: low (10/21/2023)  RADIOLOGY CT abdomen and pelvis: No obstruction, no wall thickening, no masses (March 2025)      CBC    Component Value Date/Time   WBC 4.6 10/20/2023 1325   RBC 5.12 10/20/2023 1325   HGB 12.9 (L) 10/20/2023 1325   HGB 13.8 05/29/2023 1142   HCT 41.7 10/20/2023 1325   HCT 42.4 05/29/2023 1142   PLT 309 10/20/2023 1325   PLT 236 05/29/2023 1142   MCV 81.4 10/20/2023 1325   MCV 90 05/29/2023 1142   MCH 25.2 (L) 10/20/2023 1325   MCHC 30.9 10/20/2023 1325   RDW 15.4 10/20/2023 1325   RDW 14.4 05/29/2023 1142   LYMPHSABS 1.6 09/07/2023 0951   LYMPHSABS 2.3 05/29/2023 1142   MONOABS 0.7 09/07/2023 0951   EOSABS 0.1 09/07/2023 0951   EOSABS 0.2 05/29/2023 1142   BASOSABS 0.0 09/07/2023 0951   BASOSABS 0.1 05/29/2023 1142   Recent Labs    06/30/23 1419 06/30/23 1420 06/30/23 1424 07/02/23 0255 07/03/23 0308 07/04/23 0255 07/05/23 0248 07/06/23 1226 09/07/23 0951 10/20/23 1325  HGB 12.9* 15.6 15.6 14.0 13.5 14.1 14.4 13.9 12.9* 12.9*    CMP     Component Value Date/Time   NA 135 10/20/2023 1325   NA 140 08/29/2023 0000   K 4.4 10/20/2023 1325   CL 100 10/20/2023 1325   CO2 22 10/20/2023 1325   GLUCOSE 150 (H) 10/20/2023 1325   BUN 23 10/20/2023 1325   BUN 22 (A) 08/29/2023 0000   CREATININE 1.30 (H) 10/20/2023 1325   CALCIUM  9.7 10/20/2023 1325   PROT 8.6 (H) 10/20/2023 1325   ALBUMIN  4.1 10/20/2023 1325   AST 29 10/20/2023 1325   ALT 26 10/20/2023 1325   ALKPHOS 106 10/20/2023 1325   BILITOT 1.1 10/20/2023 1325   GFRNONAA >60 10/20/2023  1325   GFRAA >90 09/25/2012 1823      Latest Ref Rng & Units 10/20/2023    1:25 PM 09/07/2023    9:51 AM 08/29/2023   12:00 AM  Hepatic Function  Total Protein 6.5 - 8.1 g/dL 8.6  8.0    Albumin  3.5 - 5.0 g/dL 4.1  4.2    AST 15 -  41 U/L 29  25    ALT 0 - 44 U/L 26  20    Alk Phosphatase 38 - 126 U/L 106  116    Total Bilirubin 0.0 - 1.2 mg/dL 1.1  0.7    Bilirubin, Direct 0.0 - 0.3 mg/dL  0.2  9.81         This result is from an external source.      Current Medications:   Current Outpatient Medications (Endocrine & Metabolic):    empagliflozin  (JARDIANCE ) 10 MG TABS tablet, Take 1 tablet (10 mg total) by mouth daily before breakfast.   levothyroxine  (SYNTHROID ) 100 MCG tablet, Take 1 tablet (100 mcg total) by mouth daily.  Current Outpatient Medications (Cardiovascular):    amiodarone  (PACERONE ) 200 MG tablet, Take 1/2 tablet (100 mg total) by mouth daily.   digoxin  (LANOXIN ) 0.125 MG tablet, Take 1 tablet (0.125 mg total) by mouth daily.   ezetimibe  (ZETIA ) 10 MG tablet, Take 1 tablet (10 mg total) by mouth daily.   midodrine  (PROAMATINE ) 5 MG tablet, Take 1 tablet (5 mg total) by mouth 3 (three) times daily with meals.   rosuvastatin  (CRESTOR ) 40 MG tablet, Take 40 mg by mouth daily.   tadalafil  (CIALIS ) 20 MG tablet, Take 1 tablet (20 mg total) by mouth daily.   torsemide  (DEMADEX ) 20 MG tablet, Take 3 tablets (60 mg total) by mouth daily.   Current Outpatient Medications (Analgesics):    acetaminophen  (TYLENOL ) 325 MG tablet, Take 1-2 tablets (325-650 mg total) by mouth every 4 (four) hours as needed for mild pain (pain score 1-3).  Current Outpatient Medications (Hematological):    apixaban  (ELIQUIS ) 5 MG TABS tablet, Take 1 tablet (5 mg total) by mouth 2 (two) times daily.   clopidogrel  (PLAVIX ) 75 MG tablet, Take 1 tablet (75 mg total) by mouth daily.  Current Outpatient Medications (Other):    linaclotide  (LINZESS ) 72 MCG capsule, Take 1 capsule (72 mcg total) by  mouth daily before breakfast.   melatonin 5 MG TABS, Take 5 mg by mouth at bedtime as needed (sleep).   pantoprazole  (PROTONIX ) 40 MG tablet, Take 1 tablet (40 mg total) by mouth 2 (two) times daily before a meal.   potassium chloride  SA (KLOR-CON  M) 20 MEQ tablet, Take 3 tablets (60 mEq total) by mouth daily.   Vitamin D-Vitamin K (VITAMIN K2-VITAMIN D3 PO), Take 1 tablet by mouth daily with lunch.  Medical History:  Past Medical History:  Diagnosis Date   Acute ST elevation myocardial infarction (STEMI) of anterior wall (HCC) 12/10/2022   Acute ST elevation myocardial infarction (STEMI) of inferior wall (HCC) 10/30/2021   Atrial fibrillation (HCC)    Atrial fibrillation with RVR (HCC) 12/19/2022   CHF (congestive heart failure) (HCC)    Coronary artery disease    Diabetes mellitus without complication (HCC)    DM (diabetes mellitus) (HCC)    H. pylori infection    Heart attack (HCC)    HLD (hyperlipidemia)    Hypertension    ST elevation myocardial infarction involving left anterior descending (LAD) coronary artery (HCC) 12/10/2022   Allergies:  Allergies  Allergen Reactions   Bovine (Beef) Protein-Containing Drug Products    Porcine (Pork) Protein-Containing Drug Products Other (See Comments)    Religious observance     Surgical History:  He  has a past surgical history that includes Coronary/Graft Acute MI Revascularization (N/A, 10/30/2021); LEFT HEART CATH AND CORONARY ANGIOGRAPHY (N/A, 10/30/2021); Coronary/Graft Acute MI Revascularization (N/A, 12/10/2022); LEFT HEART CATH AND  CORONARY ANGIOGRAPHY (N/A, 12/10/2022); CARDIOVERSION (N/A, 12/14/2022); RIGHT HEART CATH (N/A, 03/20/2023); Esophagogastroduodenoscopy (N/A, 03/23/2023); ICD IMPLANT (N/A, 06/01/2023); and RIGHT HEART CATH (N/A, 06/30/2023). Family History:  His family history includes Chronic Renal Failure in his brother and father; Diabetes in his brother, brother, father, and mother; Heart attack in his brother; Stroke  in his brother.  REVIEW OF SYSTEMS  : All other systems reviewed and negative except where noted in the History of Present Illness.  PHYSICAL EXAM: BP (!) 90/58   Pulse 62   Ht 5' 9 (1.753 m)   Wt 141 lb (64 kg)   BMI 20.82 kg/m  Physical Exam   GENERAL APPEARANCE: thin appearing,  in no apparent distress. HEENT: No cervical lymphadenopathy, unremarkable thyroid , sclerae anicteric, conjunctiva pink. RESPIRATORY: Respiratory effort normal, breath sounds clear to auscultation bilaterally without rales, rhonchi, or wheezing. CARDIO: Regular rhythm with occasional extra beats, no murmurs, rubs, or gallops, peripheral pulses intact. AICD left upper chest.  ABDOMEN: Soft, non-distended, active bowel sounds in all four quadrants, non tender, no rebound, no mass appreciated. RECTAL: Declines. MUSCULOSKELETAL: Full range of motion, normal gait, 2+ edema. SKIN: Dry, intact without rashes or lesions. No jaundice. NEURO: Alert, oriented, no focal deficits. PSYCH: Cooperative, normal mood and affect.      Alan JONELLE Coombs, PA-C 11:57 AM

## 2023-10-20 NOTE — Patient Instructions (Signed)
 Increase Torsemide  60 mg daily (3 tablets) Increase Potassium to 60 mEq daily (3 tablets) We will work on getting you scheduled for surgery Return to clinic in 2 weeks to see DR Rolan

## 2023-10-22 ENCOUNTER — Ambulatory Visit (HOSPITAL_COMMUNITY): Payer: Self-pay | Admitting: Cardiology

## 2023-10-22 DIAGNOSIS — Z419 Encounter for procedure for purposes other than remedying health state, unspecified: Secondary | ICD-10-CM | POA: Diagnosis not present

## 2023-10-22 NOTE — Progress Notes (Signed)
 Advanced Heart Failure Clinic Note  PCP: Vincente Shivers, NP HF Cardiology: Dr. Rolan  Chief complaint: CHF  68 y.o. with history of CAD, DM II, HTN, HLD, ischemic cardiomyopathy/chronic systolic CHF.   Patient had inferior STEMI 10/23 s/p PCI/DES to RCA. Had residual diffuse disease in diagonals and OMs. EF 40-45% at time of cath. Subsequent echo in 10/23 with EF 55-60%.   He was not taking medications in 2024 after losing insurance. Presented with anterior STEMI 12/10/22. Cardiac cath with 100% proximal LAD treated with PCI/DES. Prior RCA stent patent, severe disease in tortuous ramus and distal LCx treated medically.  Echo with EF 25%, no LV thrombus, AK mid septum to apex, moderately reduced RV systolic function. Course complicated by atrial fibrillation with RVR. Started on IV amiodarone  and underwent DCCV to SR.  GDMT limited by soft blood pressure. Also seen by GI for abdominal pain. Had thickening of stomach on CT scan. H. Pylori later resulted positive after discharge, seen by GI and started on treatment for H pylori-related gastritis.   Returned to ED 12/19/22 with recurrent atrial fibrillation. He converted to SR while in waiting room and was discharged home.  Admitted 2/25 with acute CHF. Diuresed with IV lasix . GDMT cut back 2/2 to low BP, Entresto  and spironolactone  stopped. He was discharged home, weight 154 lbs.  Echo in 2/25 showed EF 25% with mild RV dysfunction and mild RV enlargement, PASP 60 mmHg, moderate MR.   He was seen again in the ER 03/14/23 with worsening fatigue and dyspnea.  Torsemide  was increased to 40 qam/20 qpm.   Patient was set up for RHC in 3/25 due to ongoing severe fatigue. This showed low CI 2.02 by thermodilution as well as moderate pulmonary arterial hypertension, he was admitted and started on milrinone  gtt.  He was gradually titrated off milrinone  and maintained a good co-ox off milrinone .  He had a moderate right pleural effusion, underwent right  thoracentesis.  Fluid was transudative but grew E coli, he was treated with Bactrim  based on sensitivities.  Given early satiety and poor appetite, he had an EGD that showed gastritic, biopsies showed no H pylori or gastric cancer.  HRCT chest showed bibasilar bronchiectasis, ?aspiration. However, swallow study was not suggestive of aspiration. V/Q scan was done due to pulmonary hypertension, this showed no chronic PE.  He was started on sildenafil  and meds were titrated, he felt better when he went home.   Echo in 3/25 showed EF 25-30%, mild RV dysfunction, moderate MR, PASP 62 mmHg.   Biotronik ICD placed in 5/25.  Patient was admitted again in 6/25 with dyspnea and fatigue.  BP was low, midodrine  was started and spironolactone  and tadalafil  were stopped.  RHC again showed mixed pulmonary arterial/venous hypertension and low CI at 2.1 by Fick and thermodilution.  Echo showed EF 20-25%, mild LV dilation, moderate RV dysfunction, normal RV size, PASP 44 mmHg, mild-moderate MR.   Patient returns for followup of CHF.  He continues to have poor appetite and early satiety.  Abdominal US  and gastric emptying study in 9/25 were both normal.  He does not report much shortness of breath, but his legs fatigue quickly and he tires easily, not able to walk far. Ankles swell at times. No orthopnea/PND.  SBP running in 90s with midodrine  use. He tripped and fell in the shower the day of his CPX so did not come for the test. Weight stable.  No chest pain.   ECG (personally reviewed): NSR,  RBBB, old ASMI  Labs (12/24): hgb 15.1, BNP 350, K 4.4, creatinine 1.24 Labs (2/25): K 4.1, creatinine 1.48, BNP 481, LFTs normal, free T3/T4 normal Labs (3/25): K 3.5, creatinine 1.44 => 1.27, hgb 13.4, BNP 812, HS-TnI 44 => 39 Labs (6/25): K 4, creatinine 1.39 Labs (7/25): digoxin  0.8, K 4.1, creatinine 1.46, BNP 1374 Labs (8/25): Transferrin saturation 6.5%, hgb 12.9, LFTs normal, K 3.8, creatinine 1.34, BNP 1478  SH: No  ETOH or tobacco use.  Lives at home in [place] with his wife and children. Originally from [place].   PMH: 1. Chronic systolic CHF: Ischemic cardiomyopathy.  Biotronik ICD.  - Echo (12/24): EF 20-25%, LAD territory WMAs, moderate LVH, moderately decreased RV systolic function, mild MR.  - Echo (2/25): EF 25% with mild RV dysfunction and mild RV enlargement, PASP 60 mmHg, moderate MR.  - RHC (3/25): mean RA 2, PA 60/18 mean 31, mean PCWP 17, CI 2.02 thermo, CI 2.52 Fick, PAPi 21, PVR 3.7 WU.  - Echo (3/25): EF 25-30%, mild RV dysfunction, moderate MR, PASP 62 mmHg.  - Echo (6/25): EF 20-25%, mild LV dilation, moderate RV dysfunction, normal RV size, PASP 44 mmHg, mild-moderate MR. - RHC (6/25): mean RA 5, PA 62/21 mean 35, mean PCWP 23, CI 2.1 by Fick and thermo, PVR 3.0 WU.  2. CAD: Inferior STEMI 10/23 with DES to RCA.  - Anterior STEMI (11/24): Occluded proximal LAD treated with DES, occluded D2, 90% OM1, 95% distal LCx.  Distal LCx and OM1 treated medically.  3. Atrial fibrillation: Paroxysmal.  - DCCV in 12/24.  4. H pylori gastritis 5. Hyperlipidemia 6. Type 2 diabetes 7. Pulmonary hypertension: RHC (3/25) with mean RA 2, PA 60/18 mean 31, mean PCWP 17, CI 2.02 thermo, CI 2.52 Fick, PAPi 21, PVR 3.7 WU. - V/Q scan (3/25): No chronic PE.  - HRCT chest (3/25): bibasilar bronchiectasis, ?aspiration.  - ANA, RF, anti-SCL70 Ab, anti-centromere Ab all negative.  8. Pleural effusion: On right, s/p thoracentesis.  9. Gastric emptying study in 9/25: normal  Current Outpatient Medications  Medication Sig Dispense Refill   amiodarone  (PACERONE ) 200 MG tablet Take 1/2 tablet (100 mg total) by mouth daily. 30 tablet 5   apixaban  (ELIQUIS ) 5 MG TABS tablet Take 1 tablet (5 mg total) by mouth 2 (two) times daily.     clopidogrel  (PLAVIX ) 75 MG tablet Take 1 tablet (75 mg total) by mouth daily. 90 tablet 3   digoxin  (LANOXIN ) 0.125 MG tablet Take 1 tablet (0.125 mg total) by mouth daily. 90 tablet  3   empagliflozin  (JARDIANCE ) 10 MG TABS tablet Take 1 tablet (10 mg total) by mouth daily before breakfast.     ezetimibe  (ZETIA ) 10 MG tablet Take 1 tablet (10 mg total) by mouth daily. 90 tablet 3   levothyroxine  (SYNTHROID ) 100 MCG tablet Take 1 tablet (100 mcg total) by mouth daily. 90 tablet 1   linaclotide  (LINZESS ) 145 MCG CAPS capsule Take 1 capsule (145 mcg total) by mouth daily before breakfast. 90 capsule 3   melatonin 5 MG TABS Take 5 mg by mouth at bedtime as needed (sleep).     midodrine  (PROAMATINE ) 5 MG tablet Take 1 tablet (5 mg total) by mouth 3 (three) times daily with meals. 90 tablet 0   pantoprazole  (PROTONIX ) 40 MG tablet Take 1 tablet (40 mg total) by mouth 2 (two) times daily before a meal. 180 tablet 3   rosuvastatin  (CRESTOR ) 40 MG tablet Take 40 mg by mouth daily.  tadalafil  (CIALIS ) 20 MG tablet Take 1 tablet (20 mg total) by mouth daily. 90 tablet 3   Vitamin D-Vitamin K (VITAMIN K2-VITAMIN D3 PO) Take 1 tablet by mouth daily with lunch.     acetaminophen  (TYLENOL ) 325 MG tablet Take 1-2 tablets (325-650 mg total) by mouth every 4 (four) hours as needed for mild pain (pain score 1-3). (Patient not taking: Reported on 10/20/2023)     potassium chloride  SA (KLOR-CON  M) 20 MEQ tablet Take 3 tablets (60 mEq total) by mouth daily. 180 tablet 6   torsemide  (DEMADEX ) 20 MG tablet Take 3 tablets (60 mg total) by mouth daily. 180 tablet 6   No current facility-administered medications for this encounter.   Allergies  Allergen Reactions   Bovine (Beef) Protein-Containing Drug Products    Porcine (Pork) Protein-Containing Drug Products Other (See Comments)    Religious observance   Family History  Problem Relation Age of Onset   Diabetes Mother    Chronic Renal Failure Father    Diabetes Father    Chronic Renal Failure Brother    Diabetes Brother    Diabetes Brother    Heart attack Brother    Stroke Brother    Wt Readings from Last 3 Encounters:  10/20/23  64.9 kg (143 lb)  09/12/23 64.9 kg (143 lb)  09/07/23 65.4 kg (144 lb 4 oz)  BP 92/57, weight 143 lbs  PHYSICAL EXAM: General: NAD Neck: JVP 10 cm, no thyromegaly or thyroid  nodule.  Lungs: Clear to auscultation bilaterally with normal respiratory effort. CV: Nondisplaced PMI.  Heart regular S1/S2, no S3/S4, no murmur.  1+ ankle edema.  No carotid bruit.  Normal pedal pulses.  Abdomen: Soft, nontender, no hepatosplenomegaly, no distention.  Skin: Intact without lesions or rashes.  Neurologic: Alert and oriented x 3.  Psych: Normal affect. Extremities: No clubbing or cyanosis.  HEENT: Normal.   ASSESSMENT & PLAN: 1. Chronic systolic CHF/ischemic cardiomyopathy:  Biotronik ICD.  Echo 12/24 with EF 20-25%, no LV thrombus, AK mid septum to apex, moderately reduced RV systolic function. Echo 2/25 with EF 25%, mildly reduced RV function.  Low BP has limited GDMT titration.  His profound fatigue has been concerning for low output HF. RHC 3/25 showed low CI by thermo (2.05) but preserved by Fick.  Borderline elevated PCWP, normal RA pressure.  There was also moderate mixed pulmonary arterial/pulmonary venous hypertension.  With his significant symptoms, he was started on milrinone  0.25 and added sildenafil  to try to lower PA pressure.  Milrinone  was later weaned off with medication adjustment, and co-ox remained excellent.  He was readmitted in 6/25 with CHF; RHC showed moderate mixed pulmonary arterial/pulmonary venous hypertension with CI low at 2.1 by Fick and thermo.  Echo in 6/25 showed EF 20-25%, mild LV dilation, moderate RV dysfunction, normal RV size, PASP 44 mmHg, mild-moderate MR.  We have had to peel back on GDMT due to low BP and he is now taking midodrine .  Creatinine 1.34 most recently.  He is volume overloaded on exam today with NYHA class IIIb symptoms (mainly fatigue).  - With low output heart failure (CI 2.1 and inability to tolerate GDMT - now on midodrine ), we have been discussing  advanced therapies.  He is not interested in transplant and at his age, it would be more difficult to obtain. I think he would be a good LVAD candidate. We have a window at this time while his RV function is not severely depressed and his renal function is relatively  preserved.  He has completed workup.  He spoke with another patient with an LVAD today and had a lot of his questions answered.  He is ready to proceed with LVAD.  We will discuss with surgery and find a surgical date, I will admit him a couple of days before surgery for Swan and optimization.  - Increase torsemide  to 60 mg daily and KCl to 40 daily.  BMET/BNP today and BMET in 10 days.  - Continue Jardiance  10 mg daily. - Continue digoxin  0.125 daily, check digoxin  level today.  - He is on midodrine  5 mg tid to maintain MAP.   - off Toprol  XL with low output.  - Off spironolactone  with low BP.  2. CAD:  Hx inferior STEMI 10/23 s/p PCI/DES to RCA. Anterior STEMI 12/10/22, cath showed occluded pLAD treated with PCI/DES, prior RCA stent patent, severe disease OM1 and distal LCx treated medically. No chest pain.  - Continue Plavix .   - No ASA given Eliquis  use.  - Continue rosuvastatin  40 mg + Zetia  10 mg daily. 3. Atrial fibrillation: Paroxysmal. NSR today.  - continue Eliquis  5 mg bid  - Continue amiodarone  100 mg daily, check LFTs/TSH today.  He will need a regular eye exam.  4. DM II: Continue Jardiance   5. 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 Not a smoker, no known lung disease. CXR has shown some chronic interstitial changes. HRCT chest with bibasilar bronchiectasis that may be due to aspiration (though swallow study negative).  V/Q scan showed no chronic PE. Rheumatologic serologies negative - Continue tadalafil  20 mg daily.   6. Pleural effusion: Moderate on right. S/p right thoracentesis 3/25. Pleural fluid was transudative by Light's criteria, no malignant cells. However, it  grew E coli that was treated with antibiotics.   7. GI: History of early satiety, GI discomfort.  CT abdomen/pelvis with gastric wall thickening. GI was consulted and EGD was performed, no definite evidence for malignancy, biopsies taken for H pylori testing and pathology => negative for H pylori and no gastric cancer.  Gastric emptying study normal in 9/25.  Abdominal US  showed normal appearing liver in 9/25.  Heart failure is likely the main cause of his GI symptoms.   8. 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  - Continue Levoxyl , check TSH today.  9. Fe deficiency anemia: Fe studies today.   Arranging admission for LVAD placement.   I spent 42 minutes reviewing records, interviewing/examining patient, and managing orders.    Ezra Shuck 10/22/23

## 2023-10-23 ENCOUNTER — Other Ambulatory Visit (HOSPITAL_COMMUNITY): Payer: Self-pay | Admitting: Unknown Physician Specialty

## 2023-10-23 ENCOUNTER — Encounter: Payer: Self-pay | Admitting: Physician Assistant

## 2023-10-23 ENCOUNTER — Other Ambulatory Visit (HOSPITAL_COMMUNITY): Payer: Self-pay

## 2023-10-23 ENCOUNTER — Ambulatory Visit: Admitting: Physician Assistant

## 2023-10-23 ENCOUNTER — Telehealth (HOSPITAL_COMMUNITY): Payer: Self-pay | Admitting: Cardiology

## 2023-10-23 ENCOUNTER — Telehealth: Payer: Self-pay

## 2023-10-23 ENCOUNTER — Other Ambulatory Visit (HOSPITAL_COMMUNITY): Payer: Self-pay | Admitting: Cardiology

## 2023-10-23 VITALS — BP 90/58 | HR 62 | Ht 69.0 in | Wt 141.0 lb

## 2023-10-23 DIAGNOSIS — R634 Abnormal weight loss: Secondary | ICD-10-CM | POA: Diagnosis not present

## 2023-10-23 DIAGNOSIS — D509 Iron deficiency anemia, unspecified: Secondary | ICD-10-CM | POA: Diagnosis not present

## 2023-10-23 DIAGNOSIS — E538 Deficiency of other specified B group vitamins: Secondary | ICD-10-CM

## 2023-10-23 DIAGNOSIS — R748 Abnormal levels of other serum enzymes: Secondary | ICD-10-CM

## 2023-10-23 DIAGNOSIS — D649 Anemia, unspecified: Secondary | ICD-10-CM | POA: Insufficient documentation

## 2023-10-23 DIAGNOSIS — I4891 Unspecified atrial fibrillation: Secondary | ICD-10-CM

## 2023-10-23 DIAGNOSIS — R140 Abdominal distension (gaseous): Secondary | ICD-10-CM

## 2023-10-23 DIAGNOSIS — K5900 Constipation, unspecified: Secondary | ICD-10-CM

## 2023-10-23 DIAGNOSIS — I2510 Atherosclerotic heart disease of native coronary artery without angina pectoris: Secondary | ICD-10-CM

## 2023-10-23 DIAGNOSIS — I5022 Chronic systolic (congestive) heart failure: Secondary | ICD-10-CM

## 2023-10-23 MED ORDER — LINACLOTIDE 72 MCG PO CAPS
72.0000 ug | ORAL_CAPSULE | Freq: Every day | ORAL | 3 refills | Status: DC
  Filled 2023-10-23: qty 90, 90d supply, fill #0

## 2023-10-23 NOTE — Telephone Encounter (Signed)
 Pt was scheduled for the CT colon on 11/02/2023 at [facility] [address] at 10:00 AM. Pt to arrive at 9:40 AM.  Pt to pick up prep by 10/30/2023 from [facility].  Left message for pt to call back

## 2023-10-23 NOTE — Progress Notes (Signed)
 Remote ICD Transmission

## 2023-10-23 NOTE — Telephone Encounter (Signed)
 Craig Palma R, PA-C  P Lbgi Pod B Triage Can we schedule for CT colon diagnostic? For IDA and constipation? Can happen at next desk time, does not need to happen immediately. I walked with the patient about the possibility and then talked with Dr. San and he agrees. Thanks

## 2023-10-23 NOTE — Patient Instructions (Addendum)
 Linzess  72 mcg *IBS-C patients may begin to experience relief from belly pain and overall abdominal symptoms (pain, discomfort, and bloating) in about 1 week,  with symptoms typically improving over 12 weeks.  Take at least 30 minutes before the first meal of the day on an empty stomach You can have a loose stool if you eat a high-fat breakfast. Give it at least 7 days, may have more bowel movements during that time.   The diarrhea should go away and you should start having normal, complete, full bowel movements.  It may be helpful to start treatment when you can be near the comfort of your own bathroom, such as a weekend.  After you are out we can send in a prescription if you did well, there is a prescription card  Follow the instructions on the Hemoccult cards and mail them back to us  when you are finished or you may take them directly to the lab in the basement of the [place] building. We will call you with the results.    Due to recent changes in healthcare laws, you may see the results of your imaging and laboratory studies on MyChart before your provider has had a chance to review them.  We understand that in some cases there may be results that are confusing or concerning to you. Not all laboratory results come back in the same time frame and the provider may be waiting for multiple results in order to interpret others.  Please give us  48 hours in order for your provider to thoroughly review all the results before contacting the office for clarification of your results.    I appreciate the  opportunity to care for you  Thank You   [facility]

## 2023-10-23 NOTE — Telephone Encounter (Signed)
 Patient referred to infusion pharmacy team for ambulatory infusion of IV iron.  Insurance - Medicare Part A/B Site of care - Site of care: [facility] Dx code - D64.9, D50.9 IV Iron Therapy - Feraheme 510 mg x 2 Infusion appointments - Scheduling team will schedule patient as soon as possible.   Thank you,  Norton Blush, PharmD, BCSCP Pharmacist [facility]

## 2023-10-24 ENCOUNTER — Telehealth: Payer: Self-pay | Admitting: Pharmacy Technician

## 2023-10-24 NOTE — Telephone Encounter (Signed)
 Pt made aware of providers recommendations  Pt was scheduled for the CT colon on 11/02/2023 at [facility] [address] at 10:00 AM. Pt to arrive at 9:40 AM. Pt made aware.  Pt to pick up prep by 10/30/2023 from [facility]. Pt made aware    Pt verbalized understanding with all questions answered.

## 2023-10-24 NOTE — Telephone Encounter (Signed)
 Auth Submission: NO AUTH NEEDED Site of care: Site of care: [facility] Payer: MEDICARE A/B Medication & CPT/J Code(s) submitted: Feraheme (ferumoxytol ) [id number] Diagnosis Code: D50.9 Route of submission (phone, fax, portal):  Phone # Fax # Auth type: Buy/Bill PB Units/visits requested: X2  DOSES Reference number:  Approval from: 10/23/23 to 02/10/24

## 2023-10-25 ENCOUNTER — Ambulatory Visit: Payer: Self-pay | Admitting: Cardiology

## 2023-10-25 DIAGNOSIS — H5213 Myopia, bilateral: Secondary | ICD-10-CM | POA: Diagnosis not present

## 2023-10-30 ENCOUNTER — Telehealth (HOSPITAL_COMMUNITY): Payer: Self-pay

## 2023-10-30 NOTE — Telephone Encounter (Signed)
 Pt's daughter called [facility] this afternoon requesting to postpone admission on Thursday following RHC for optimization for LVAD. She states that he has GI issues that he needs to work out prior to moving forward with heart surgery. Extensive GI workup has already been completed with no findings. Dr. Rolan spoke at length with pt regarding the cause of his GI symptoms more than likely being related to his heart failure. Pt asked to come into [facility] and bring family tomorrow or Wednesday to discuss further with Dr.McLean. Pt agreeable and appointment scheduled for 10/22 at 1pm. Dr. Rolan aware.  Schuyler Lunger RN, BSN [facility] Coordinator 24/7 Pager [phone]

## 2023-10-31 ENCOUNTER — Ambulatory Visit (HOSPITAL_COMMUNITY)
Admission: RE | Admit: 2023-10-31 | Discharge: 2023-10-31 | Disposition: A | Discharge status: Home / Self Care | Source: Ambulatory Visit | Attending: Cardiology | Admitting: Cardiology

## 2023-10-31 VITALS — BP 98/68 | HR 67 | Temp 97.3°F | Resp 16

## 2023-10-31 DIAGNOSIS — D509 Iron deficiency anemia, unspecified: Secondary | ICD-10-CM | POA: Insufficient documentation

## 2023-10-31 DIAGNOSIS — D649 Anemia, unspecified: Secondary | ICD-10-CM | POA: Diagnosis present

## 2023-10-31 MED ORDER — SODIUM CHLORIDE 0.9 % IV SOLN
510.0000 mg | Freq: Once | INTRAVENOUS | Status: AC
  Administered 2023-10-31: 510 mg via INTRAVENOUS
  Filled 2023-10-31: qty 510

## 2023-11-01 ENCOUNTER — Other Ambulatory Visit (HOSPITAL_COMMUNITY): Payer: Self-pay

## 2023-11-01 ENCOUNTER — Ambulatory Visit (HOSPITAL_COMMUNITY)
Admission: RE | Admit: 2023-11-01 | Discharge: 2023-11-01 | Disposition: A | Discharge status: Home / Self Care | Source: Ambulatory Visit | Attending: Cardiology | Admitting: Cardiology

## 2023-11-01 ENCOUNTER — Encounter (HOSPITAL_COMMUNITY): Payer: Self-pay | Admitting: Cardiology

## 2023-11-01 ENCOUNTER — Encounter (HOSPITAL_COMMUNITY): Payer: Self-pay | Admitting: Certified Registered Nurse Anesthetist

## 2023-11-01 ENCOUNTER — Other Ambulatory Visit: Payer: Self-pay

## 2023-11-01 ENCOUNTER — Telehealth (HOSPITAL_COMMUNITY): Payer: Self-pay

## 2023-11-01 ENCOUNTER — Ambulatory Visit (HOSPITAL_COMMUNITY): Payer: Self-pay | Admitting: Cardiology

## 2023-11-01 VITALS — BP 102/75 | HR 70 | Wt 140.2 lb

## 2023-11-01 DIAGNOSIS — J948 Other specified pleural conditions: Secondary | ICD-10-CM | POA: Diagnosis not present

## 2023-11-01 DIAGNOSIS — Z955 Presence of coronary angioplasty implant and graft: Secondary | ICD-10-CM | POA: Insufficient documentation

## 2023-11-01 DIAGNOSIS — Z7984 Long term (current) use of oral hypoglycemic drugs: Secondary | ICD-10-CM | POA: Insufficient documentation

## 2023-11-01 DIAGNOSIS — K2970 Gastritis, unspecified, without bleeding: Secondary | ICD-10-CM | POA: Insufficient documentation

## 2023-11-01 DIAGNOSIS — E8770 Fluid overload, unspecified: Secondary | ICD-10-CM | POA: Insufficient documentation

## 2023-11-01 DIAGNOSIS — I110 Hypertensive heart disease with heart failure: Secondary | ICD-10-CM | POA: Diagnosis not present

## 2023-11-01 DIAGNOSIS — Z7901 Long term (current) use of anticoagulants: Secondary | ICD-10-CM | POA: Diagnosis not present

## 2023-11-01 DIAGNOSIS — Z79899 Other long term (current) drug therapy: Secondary | ICD-10-CM | POA: Insufficient documentation

## 2023-11-01 DIAGNOSIS — I255 Ischemic cardiomyopathy: Secondary | ICD-10-CM | POA: Diagnosis not present

## 2023-11-01 DIAGNOSIS — J90 Pleural effusion, not elsewhere classified: Secondary | ICD-10-CM | POA: Diagnosis not present

## 2023-11-01 DIAGNOSIS — I959 Hypotension, unspecified: Secondary | ICD-10-CM | POA: Insufficient documentation

## 2023-11-01 DIAGNOSIS — R630 Anorexia: Secondary | ICD-10-CM | POA: Insufficient documentation

## 2023-11-01 DIAGNOSIS — J479 Bronchiectasis, uncomplicated: Secondary | ICD-10-CM | POA: Diagnosis not present

## 2023-11-01 DIAGNOSIS — R5383 Other fatigue: Secondary | ICD-10-CM | POA: Insufficient documentation

## 2023-11-01 DIAGNOSIS — Z9889 Other specified postprocedural states: Secondary | ICD-10-CM | POA: Diagnosis not present

## 2023-11-01 DIAGNOSIS — I2721 Secondary pulmonary arterial hypertension: Secondary | ICD-10-CM | POA: Insufficient documentation

## 2023-11-01 DIAGNOSIS — I480 Paroxysmal atrial fibrillation: Secondary | ICD-10-CM | POA: Insufficient documentation

## 2023-11-01 DIAGNOSIS — I5023 Acute on chronic systolic (congestive) heart failure: Secondary | ICD-10-CM | POA: Diagnosis not present

## 2023-11-01 DIAGNOSIS — Z7902 Long term (current) use of antithrombotics/antiplatelets: Secondary | ICD-10-CM | POA: Diagnosis not present

## 2023-11-01 DIAGNOSIS — I272 Pulmonary hypertension, unspecified: Secondary | ICD-10-CM | POA: Diagnosis not present

## 2023-11-01 DIAGNOSIS — I5022 Chronic systolic (congestive) heart failure: Secondary | ICD-10-CM | POA: Diagnosis not present

## 2023-11-01 DIAGNOSIS — I252 Old myocardial infarction: Secondary | ICD-10-CM | POA: Insufficient documentation

## 2023-11-01 DIAGNOSIS — I2510 Atherosclerotic heart disease of native coronary artery without angina pectoris: Secondary | ICD-10-CM | POA: Insufficient documentation

## 2023-11-01 DIAGNOSIS — D509 Iron deficiency anemia, unspecified: Secondary | ICD-10-CM | POA: Insufficient documentation

## 2023-11-01 DIAGNOSIS — E039 Hypothyroidism, unspecified: Secondary | ICD-10-CM | POA: Diagnosis not present

## 2023-11-01 DIAGNOSIS — R6881 Early satiety: Secondary | ICD-10-CM | POA: Diagnosis not present

## 2023-11-01 DIAGNOSIS — D539 Nutritional anemia, unspecified: Secondary | ICD-10-CM | POA: Insufficient documentation

## 2023-11-01 DIAGNOSIS — E119 Type 2 diabetes mellitus without complications: Secondary | ICD-10-CM | POA: Insufficient documentation

## 2023-11-01 DIAGNOSIS — Z79890 Hormone replacement therapy (postmenopausal): Secondary | ICD-10-CM | POA: Insufficient documentation

## 2023-11-01 DIAGNOSIS — Z95810 Presence of automatic (implantable) cardiac defibrillator: Secondary | ICD-10-CM | POA: Insufficient documentation

## 2023-11-01 DIAGNOSIS — E785 Hyperlipidemia, unspecified: Secondary | ICD-10-CM | POA: Insufficient documentation

## 2023-11-01 LAB — BASIC METABOLIC PANEL WITH GFR
Anion gap: 7 (ref 5–15)
BUN: 21 mg/dL (ref 8–23)
CO2: 25 mmol/L (ref 22–32)
Calcium: 9.2 mg/dL (ref 8.9–10.3)
Chloride: 103 mmol/L (ref 98–111)
Creatinine, Ser: 1.22 mg/dL (ref 0.61–1.24)
GFR, Estimated: >60 mL/min (ref >60)
Glucose, Bld: 122 mg/dL — ABNORMAL HIGH (ref 70–99)
Potassium: 3.9 mmol/L (ref 3.5–5.1)
Sodium: 135 mmol/L (ref 135–145)

## 2023-11-01 LAB — DIGOXIN LEVEL: Digoxin Level: <0.6 ng/mL — ABNORMAL LOW (ref 0.8–2.0)

## 2023-11-01 MED ORDER — VERQUVO 2.5 MG PO TABS
2.5000 mg | ORAL_TABLET | Freq: Every day | ORAL | 3 refills | Status: DC
  Filled 2023-11-01 – 2023-11-14 (×3): qty 30, 30d supply, fill #0

## 2023-11-01 NOTE — Progress Notes (Addendum)
 Advanced Heart Failure Clinic Note  PCP: Vincente Shivers, NP HF Cardiology: Dr. Rolan  Chief complaint: CHF  68 y.o. with history of CAD, DM II, HTN, HLD, ischemic cardiomyopathy/chronic systolic CHF.   Patient had inferior STEMI 10/23 s/p PCI/DES to RCA. Had residual diffuse disease in diagonals and OMs. EF 40-45% at time of cath. Subsequent echo in 10/23 with EF 55-60%.   He was not taking medications in 2024 after losing insurance. Presented with anterior STEMI 12/10/22. Cardiac cath with 100% proximal LAD treated with PCI/DES. Prior RCA stent patent, severe disease in tortuous ramus and distal LCx treated medically.  Echo with EF 25%, no LV thrombus, AK mid septum to apex, moderately reduced RV systolic function. Course complicated by atrial fibrillation with RVR. Started on IV amiodarone  and underwent DCCV to SR.  GDMT limited by soft blood pressure. Also seen by GI for abdominal pain. Had thickening of stomach on CT scan. H. Pylori later resulted positive after discharge, seen by GI and started on treatment for H pylori-related gastritis.   Returned to ED 12/19/22 with recurrent atrial fibrillation. He converted to SR while in waiting room and was discharged home.  Admitted 2/25 with acute CHF. Diuresed with IV lasix . GDMT cut back 2/2 to low BP, Entresto  and spironolactone  stopped. He was discharged home, weight 154 lbs.  Echo in 2/25 showed EF 25% with mild RV dysfunction and mild RV enlargement, PASP 60 mmHg, moderate MR.   He was seen again in the ER 03/14/23 with worsening fatigue and dyspnea.  Torsemide  was increased to 40 qam/20 qpm.   Patient was set up for RHC in 3/25 due to ongoing severe fatigue. This showed low CI 2.02 by thermodilution as well as moderate pulmonary arterial hypertension, he was admitted and started on milrinone  gtt.  He was gradually titrated off milrinone  and maintained a good co-ox off milrinone .  He had a moderate right pleural effusion, underwent right  thoracentesis.  Fluid was transudative but grew E coli, he was treated with Bactrim  based on sensitivities.  Given early satiety and poor appetite, he had an EGD that showed gastritic, biopsies showed no H pylori or gastric cancer.  HRCT chest showed bibasilar bronchiectasis, ?aspiration. However, swallow study was not suggestive of aspiration. V/Q scan was done due to pulmonary hypertension, this showed no chronic PE.  He was started on sildenafil  and meds were titrated, he felt better when he went home.   Echo in 3/25 showed EF 25-30%, mild RV dysfunction, moderate MR, PASP 62 mmHg.   Biotronik ICD placed in 5/25.  Patient was admitted again in 6/25 with dyspnea and fatigue.  BP was low, midodrine  was started and spironolactone  and tadalafil  were stopped.  RHC again showed mixed pulmonary arterial/venous hypertension and low CI at 2.1 by Fick and thermodilution.  Echo showed EF 20-25%, mild LV dilation, moderate RV dysfunction, normal RV size, PASP 44 mmHg, mild-moderate MR.   Patient returns for followup of CHF.  He continues to have poor appetite and early satiety.  Abdominal US  and gastric emptying study in 9/25 were both normal, virtual colonoscopy is planned.  He has been noted to have iron deficiency anemia and got IV Fe.  He says that he feels better overall.  Tires easily when he walks further than around his house. Not much exertional dyspnea. Ankles continue to swell. No orthopnea/PND.  SBP running in 100s today with midodrine  use. No chest pain. Weight down 3 lbs.   Labs (12/24): hgb  15.1, BNP 350, K 4.4, creatinine 1.24 Labs (2/25): K 4.1, creatinine 1.48, BNP 481, LFTs normal, free T3/T4 normal Labs (3/25): K 3.5, creatinine 1.44 => 1.27, hgb 13.4, BNP 812, HS-TnI 44 => 39 Labs (6/25): K 4, creatinine 1.39 Labs (7/25): digoxin  0.8, K 4.1, creatinine 1.46, BNP 1374 Labs (8/25): Transferrin saturation 6.5%, hgb 12.9, LFTs normal, K 3.8, creatinine 1.34, BNP 1478 Labs (10/25): Ferritin  19, transferrin saturation 6%, digoxin  < 0.6, TSH 7.9, BNP 1887, K 4.4, creatinine 1.3, LFTs normal, hgb 12.9  SH: No ETOH or tobacco use.  Lives at home in [place] with his wife and children. Originally from [place].   PMH: 1. Chronic systolic CHF: Ischemic cardiomyopathy.  Biotronik ICD.  - Echo (12/24): EF 20-25%, LAD territory WMAs, moderate LVH, moderately decreased RV systolic function, mild MR.  - Echo (2/25): EF 25% with mild RV dysfunction and mild RV enlargement, PASP 60 mmHg, moderate MR.  - RHC (3/25): mean RA 2, PA 60/18 mean 31, mean PCWP 17, CI 2.02 thermo, CI 2.52 Fick, PAPi 21, PVR 3.7 WU.  - Echo (3/25): EF 25-30%, mild RV dysfunction, moderate MR, PASP 62 mmHg.  - Echo (6/25): EF 20-25%, mild LV dilation, moderate RV dysfunction, normal RV size, PASP 44 mmHg, mild-moderate MR. - RHC (6/25): mean RA 5, PA 62/21 mean 35, mean PCWP 23, CI 2.1 by Fick and thermo, PVR 3.0 WU.  2. CAD: Inferior STEMI 10/23 with DES to RCA.  - Anterior STEMI (11/24): Occluded proximal LAD treated with DES, occluded D2, 90% OM1, 95% distal LCx.  Distal LCx and OM1 treated medically.  3. Atrial fibrillation: Paroxysmal.  - DCCV in 12/24.  4. H pylori gastritis 5. Hyperlipidemia 6. Type 2 diabetes 7. Pulmonary hypertension: RHC (3/25) with mean RA 2, PA 60/18 mean 31, mean PCWP 17, CI 2.02 thermo, CI 2.52 Fick, PAPi 21, PVR 3.7 WU. - V/Q scan (3/25): No chronic PE.  - HRCT chest (3/25): bibasilar bronchiectasis, ?aspiration.  - ANA, RF, anti-SCL70 Ab, anti-centromere Ab all negative.  8. Pleural effusion: On right, s/p thoracentesis.  9. Gastric emptying study in 9/25: normal 10. Fe deficiency anemia  Current Outpatient Medications  Medication Sig Dispense Refill   acetaminophen  (TYLENOL ) 325 MG tablet Take 1-2 tablets (325-650 mg total) by mouth every 4 (four) hours as needed for mild pain (pain score 1-3).     amiodarone  (PACERONE ) 200 MG tablet Take 1/2 tablet (100 mg total) by mouth daily.  30 tablet 5   apixaban  (ELIQUIS ) 5 MG TABS tablet Take 1 tablet (5 mg total) by mouth 2 (two) times daily.     clopidogrel  (PLAVIX ) 75 MG tablet Take 1 tablet (75 mg total) by mouth daily. 90 tablet 3   digoxin  (LANOXIN ) 0.125 MG tablet Take 1 tablet (0.125 mg total) by mouth daily. 90 tablet 3   empagliflozin  (JARDIANCE ) 10 MG TABS tablet Take 1 tablet (10 mg total) by mouth daily before breakfast.     ezetimibe  (ZETIA ) 10 MG tablet Take 1 tablet (10 mg total) by mouth daily. 90 tablet 3   levothyroxine  (SYNTHROID ) 100 MCG tablet Take 1 tablet (100 mcg total) by mouth daily. 90 tablet 1   linaclotide  (LINZESS ) 72 MCG capsule Take 1 capsule (72 mcg total) by mouth daily before breakfast. 90 capsule 3   melatonin 5 MG TABS Take 5 mg by mouth at bedtime as needed (sleep).     midodrine  (PROAMATINE ) 5 MG tablet Take 1 tablet (5 mg total) by mouth  3 (three) times daily with meals. 90 tablet 0   pantoprazole  (PROTONIX ) 40 MG tablet Take 1 tablet (40 mg total) by mouth 2 (two) times daily before a meal. 180 tablet 3   potassium chloride  SA (KLOR-CON  M) 20 MEQ tablet Take 3 tablets (60 mEq total) by mouth daily. 180 tablet 6   rosuvastatin  (CRESTOR ) 40 MG tablet Take 40 mg by mouth daily.     tadalafil  (CIALIS ) 20 MG tablet Take 1 tablet (20 mg total) by mouth daily. 90 tablet 3   torsemide  (DEMADEX ) 20 MG tablet Take 3 tablets (60 mg total) by mouth daily. 180 tablet 6   Vericiguat (VERQUVO) 2.5 MG TABS Take 1 tablet (2.5 mg total) by mouth daily. 30 tablet 3   Vitamin D-Vitamin K (VITAMIN K2-VITAMIN D3 PO) Take 1 tablet by mouth daily with lunch.     No current facility-administered medications for this encounter.   Allergies  Allergen Reactions   Bovine (Beef) Protein-Containing Drug Products    Porcine (Pork) Protein-Containing Drug Products Other (See Comments)    Religious observance   Family History  Problem Relation Age of Onset   Diabetes Mother    Chronic Renal Failure Father     Diabetes Father    Chronic Renal Failure Brother    Diabetes Brother    Diabetes Brother    Heart attack Brother    Stroke Brother    Wt Readings from Last 3 Encounters:  11/01/23 63.6 kg (140 lb 3.2 oz)  10/23/23 64 kg (141 lb)  10/20/23 64.9 kg (143 lb)  BP 102/75 Comment: 85  Pulse 70   Wt 63.6 kg (140 lb 3.2 oz)   SpO2 99%   BMI 20.70 kg/m   PHYSICAL EXAM: General: NAD Neck: JVP 8 cm, no thyromegaly or thyroid  nodule.  Lungs: Clear to auscultation bilaterally with normal respiratory effort. CV: Nondisplaced PMI.  Heart regular S1/S2, no S3/S4, no murmur.  1+ ankle edema.  No carotid bruit.  Normal pedal pulses.  Abdomen: Soft, nontender, no hepatosplenomegaly, no distention.  Skin: Intact without lesions or rashes.  Neurologic: Alert and oriented x 3.  Psych: Normal affect. Extremities: No clubbing or cyanosis.  HEENT: Normal.   ASSESSMENT & PLAN: 1. Chronic systolic CHF/ischemic cardiomyopathy:  Biotronik ICD.  Echo 12/24 with EF 20-25%, no LV thrombus, AK mid septum to apex, moderately reduced RV systolic function. Echo 2/25 with EF 25%, mildly reduced RV function.  Low BP has limited GDMT titration.  His profound fatigue has been concerning for low output HF. RHC 3/25 showed low CI by thermo (2.05) but preserved by Fick.  Borderline elevated PCWP, normal RA pressure.  There was also moderate mixed pulmonary arterial/pulmonary venous hypertension.  With his significant symptoms, he was started on milrinone  0.25 and added sildenafil  to try to lower PA pressure.  Milrinone  was later weaned off with medication adjustment, and co-ox remained excellent.  He was readmitted in 6/25 with CHF; RHC showed moderate mixed pulmonary arterial/pulmonary venous hypertension with CI low at 2.1 by Fick and thermo.  Echo in 6/25 showed EF 20-25%, mild LV dilation, moderate RV dysfunction, normal RV size, PASP 44 mmHg, mild-moderate MR.  We have had to peel back on GDMT due to low BP and he is now  taking midodrine .  Creatinine 1.3 most recently.  Mild volume overload with NYHA class IIIb symptoms (mainly fatigue).  - With low output heart failure (CI 2.1 on last RHC and inability to tolerate GDMT - now on  midodrine ), we have been discussing advanced therapies.  He is not interested in transplant and at his age, it would be more difficult to obtain. I think he would be a good LVAD candidate. We have a window at this time while his RV function is not severely depressed and his renal function is relatively preserved.  He has completed workup.  We had him set up for LVAD surgery next week but today, he says that he wants to wait longer (daughter to get married soon).  We had a long discussion today about this.  We will follow him very closely, I am concerned that we miss his window for LVAD implantation.  - Continue torsemide  60 mg daily and KCl 40 daily.  BMET/BNP today.  - Continue Jardiance  10 mg daily. - Continue digoxin  0.125 daily, check digoxin  level today.  - He is on midodrine  5 mg tid to maintain MAP.   - off Toprol  XL with low output.  - Off spironolactone  with low BP.  - Add vericiguat 2.5 mg daily.  This should not have much affect on his BP.  2. CAD:  Hx inferior STEMI 10/23 s/p PCI/DES to RCA. Anterior STEMI 12/10/22, cath showed occluded pLAD treated with PCI/DES, prior RCA stent patent, severe disease OM1 and distal LCx treated medically. No chest pain.  - Continue Plavix .   - No ASA given Eliquis  use.  - Continue rosuvastatin  40 mg + Zetia  10 mg daily. 3. Atrial fibrillation: Paroxysmal. Has been in NSR.  - continue Eliquis  5 mg bid  - Continue amiodarone  100 mg daily, recent LFTs normal with slightly elevated TSH.  He will need a regular eye exam.  4. DM II: Continue Jardiance   5. 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 Not a smoker, no known lung disease. CXR has shown some chronic interstitial changes. HRCT chest with  bibasilar bronchiectasis that may be due to aspiration (though swallow study negative).  V/Q scan showed no chronic PE. Rheumatologic serologies negative - Continue tadalafil  20 mg daily.   6. Pleural effusion: Moderate on right. S/p right thoracentesis 3/25. Pleural fluid was transudative by Light's criteria, no malignant cells. However, it grew E coli that was treated with antibiotics.   7. GI: History of early satiety, GI discomfort.  CT abdomen/pelvis with gastric wall thickening. GI was consulted and EGD was performed, no definite evidence for malignancy, biopsies taken for H pylori testing and pathology => negative for H pylori and no gastric cancer.  Gastric emptying study normal in 9/25.  Abdominal US  showed normal appearing liver in 9/25.  Heart failure is likely the main cause of his GI symptoms.  However, with recent discovery of Fe deficiency anemia, he is now planned for a virtual colonoscopy.  8. 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  - Continue Levoxyl , TSH recently minimally elevated.   9. Fe deficiency anemia: He had IV Fe recently.   I spent 42 minutes reviewing records, interviewing/examining patient, and managing orders. Patient's daughter was present.   Followup 3 wks   Ezra Shuck 11/01/23

## 2023-11-01 NOTE — Addendum Note (Signed)
 Encounter addended by: Rolan Ezra RAMAN, MD on: 11/01/2023 10:23 PM  Actions taken: Clinical Note Signed

## 2023-11-01 NOTE — Progress Notes (Signed)
 Patient presents for VAD evaluation follow up in VAD Clinic today with his daughter Rana.   Patient's daughter called into the VAD Clinic Monday expressing the need to postpone VAD implant until further notice. Patient has a virtual colonoscopy scheduled for tomorrow and they want to continue to pursue his GI workup first. Discussed with Dr.McLean and MD advised for pt to come into clinic for in person conversation with family present.   Patient ambulated into clinic independently. States he is feeling better since last visit. Denies lightheadedness, dizziness, falls, shortness of breath, and signs of bleeding. Patient reports he is still having stomach pain but his appetite is improving.   Extensive conversation held with Dr Rolan about patient wanting to hold off on VAD. Pt is currently taking Midodrine  5 TID. Pt was informed that he is end stage heart failure and his options medically have been exhausted. Dr. Rolan discussed survival statistics with and without VAD therapy. Patient expressed that his eldest daughter is getting married at the end of December and he would like to support his family during this time without the stress of recovering from surgery.   Decision made for patient to follow through with current GI work up plans with we will closely follow him in VAD Clinic. Advised that if he starts to becoming increasingly symptomatic he must call to get seen by Dr. Rolan sooner.  Orders received to start Verquvo 2.5mg .   Vital Signs:  HR: 70 BP: 102/75 (85) SPO2: 99%   Weight: 143.8 lb  Last weight: 140.2 lb   Symptom YES NO DETAILS  Angina  x Activity:  Claudication  x How Far:  Syncope  x When:  Stroke  x   Orthopnea x  How many pillows: 3-4  PND  x How often:  CPAP  x How many hours:  Pedal Edema  x daily  Abdominal Fullness x  Intermittent stomach pain  Nausea / Vomit  x Increased appetite  Diaphoresis  x When:  Shortness of Breath  x Activity:  Palpitations  x  When:  ICD shock  x   Bleeding S/S  x   Tea-colored Urine  x   Hospitalizations  x   Emergency Room  x   Other MD     Activity   Fluid   Diet Increased appetite    Device: Biotronik Therapies: on  Last check: 10/18/2023  Patient Instructions:  Start Verquvo 2.5mg  daily  Return for follow up in 3 weeks  Schuyler Lunger RN, BSN VAD Coordinator 24/7 Pager [phone]

## 2023-11-02 ENCOUNTER — Other Ambulatory Visit (HOSPITAL_COMMUNITY): Payer: Self-pay

## 2023-11-02 ENCOUNTER — Encounter (HOSPITAL_COMMUNITY): Admission: RE | Payer: Self-pay | Source: Home / Self Care

## 2023-11-02 ENCOUNTER — Ambulatory Visit
Admission: RE | Admit: 2023-11-02 | Discharge: 2023-11-02 | Disposition: A | Discharge status: Home / Self Care | Source: Ambulatory Visit | Attending: Physician Assistant | Admitting: Physician Assistant

## 2023-11-02 ENCOUNTER — Ambulatory Visit (HOSPITAL_COMMUNITY): Admission: RE | Admit: 2023-11-02 | Admitting: Cardiology

## 2023-11-02 DIAGNOSIS — D509 Iron deficiency anemia, unspecified: Secondary | ICD-10-CM

## 2023-11-02 DIAGNOSIS — I4891 Unspecified atrial fibrillation: Secondary | ICD-10-CM

## 2023-11-02 DIAGNOSIS — I5022 Chronic systolic (congestive) heart failure: Secondary | ICD-10-CM

## 2023-11-02 DIAGNOSIS — R634 Abnormal weight loss: Secondary | ICD-10-CM

## 2023-11-02 DIAGNOSIS — K5900 Constipation, unspecified: Secondary | ICD-10-CM

## 2023-11-02 SURGERY — RIGHT HEART CATH
Anesthesia: LOCAL

## 2023-11-06 ENCOUNTER — Encounter (HOSPITAL_COMMUNITY): Payer: Self-pay | Admitting: Cardiology

## 2023-11-06 ENCOUNTER — Inpatient Hospital Stay (HOSPITAL_COMMUNITY): Admission: RE | Admit: 2023-11-06 | Source: Home / Self Care | Admitting: Surgery

## 2023-11-06 ENCOUNTER — Ambulatory Visit: Payer: Self-pay | Admitting: Physician Assistant

## 2023-11-06 ENCOUNTER — Other Ambulatory Visit (HOSPITAL_COMMUNITY): Payer: Self-pay

## 2023-11-06 ENCOUNTER — Encounter (HOSPITAL_COMMUNITY): Admission: RE | Payer: Self-pay | Source: Home / Self Care

## 2023-11-06 SURGERY — INSERTION OF IMPLANTABLE LEFT VENTRICULAR ASSIST DEVICE
Anesthesia: General | Site: Chest

## 2023-11-06 NOTE — Telephone Encounter (Signed)
 Appeal faxed on 11/06/2023

## 2023-11-06 NOTE — Telephone Encounter (Signed)
 Prior authorization for Verquvo 2.5 MG  (Key [id number]) has been submitted and denied due to: preferred medications (isorsorbide mononitrate) and (isosorbide dinitrate) were not documented as try/failed or contraindicated. Chart notes indicate this pt BP may be too low for these medications. Pharmacist will review and appeal if appropriate.  Rachel DEL, CPhT [facility] Phone: [phone]

## 2023-11-07 ENCOUNTER — Other Ambulatory Visit (HOSPITAL_COMMUNITY): Payer: Self-pay

## 2023-11-07 ENCOUNTER — Encounter (HOSPITAL_COMMUNITY): Payer: Self-pay | Admitting: Cardiology

## 2023-11-08 ENCOUNTER — Inpatient Hospital Stay (HOSPITAL_COMMUNITY): Admission: RE | Admit: 2023-11-08 | Source: Ambulatory Visit | Admitting: Cardiology

## 2023-11-08 ENCOUNTER — Encounter (HOSPITAL_COMMUNITY)
Admission: RE | Admit: 2023-11-08 | Discharge: 2023-11-08 | Disposition: A | Discharge status: Home / Self Care | Source: Ambulatory Visit | Attending: Cardiology | Admitting: Cardiology

## 2023-11-08 ENCOUNTER — Other Ambulatory Visit (HOSPITAL_COMMUNITY): Payer: Self-pay

## 2023-11-08 VITALS — BP 84/65 | HR 71 | Temp 97.5°F | Resp 16

## 2023-11-08 DIAGNOSIS — D649 Anemia, unspecified: Secondary | ICD-10-CM | POA: Insufficient documentation

## 2023-11-08 DIAGNOSIS — D509 Iron deficiency anemia, unspecified: Secondary | ICD-10-CM | POA: Insufficient documentation

## 2023-11-08 MED ORDER — SODIUM CHLORIDE 0.9 % IV SOLN
510.0000 mg | Freq: Once | INTRAVENOUS | Status: AC
  Administered 2023-11-08: 510 mg via INTRAVENOUS
  Filled 2023-11-08: qty 510

## 2023-11-08 NOTE — Telephone Encounter (Signed)
 Appeal for Garrett Roberts has been approved. Test billing returns $4 for 90 day supply.  Effective: 11/07/2023 to 11/06/2024  Rachel DEL, CPhT [facility] Phone: [phone]

## 2023-11-14 ENCOUNTER — Other Ambulatory Visit: Payer: Self-pay

## 2023-11-14 ENCOUNTER — Other Ambulatory Visit (HOSPITAL_COMMUNITY): Payer: Self-pay

## 2023-11-17 ENCOUNTER — Ambulatory Visit (HOSPITAL_COMMUNITY): Admitting: Cardiology

## 2023-11-19 ENCOUNTER — Other Ambulatory Visit (HOSPITAL_COMMUNITY): Payer: Self-pay

## 2023-11-21 ENCOUNTER — Encounter: Payer: Self-pay | Admitting: "Endocrinology

## 2023-11-21 ENCOUNTER — Ambulatory Visit (INDEPENDENT_AMBULATORY_CARE_PROVIDER_SITE_OTHER): Admitting: "Endocrinology

## 2023-11-21 ENCOUNTER — Other Ambulatory Visit (HOSPITAL_COMMUNITY): Payer: Self-pay

## 2023-11-21 VITALS — BP 100/80 | HR 74 | Ht 69.0 in | Wt 144.0 lb

## 2023-11-21 DIAGNOSIS — E041 Nontoxic single thyroid nodule: Secondary | ICD-10-CM

## 2023-11-21 DIAGNOSIS — E039 Hypothyroidism, unspecified: Secondary | ICD-10-CM | POA: Diagnosis not present

## 2023-11-21 MED ORDER — LEVOTHYROXINE SODIUM 112 MCG PO TABS
112.0000 ug | ORAL_TABLET | Freq: Every day | ORAL | 1 refills | Status: DC
  Filled 2023-11-21: qty 90, 90d supply, fill #0
  Filled 2023-12-14 – 2024-01-25 (×2): qty 90, 90d supply, fill #1

## 2023-11-21 NOTE — Progress Notes (Signed)
 Outpatient Endocrinology Note Garrett Birmingham, MD  11/21/23   Garrett Roberts Jun 14, 1955 [id number]  Referring Provider: Vincente Shivers, NP Primary Care Provider: Vincente Shivers, NP Subjective  No chief complaint on file.   Assessment & Plan  Diagnoses and all orders for this visit:  Thyroid  nodule  Acquired hypothyroidism -     levothyroxine  (SYNTHROID ) 112 MCG tablet; Take 1 tablet (112 mcg total) by mouth daily. -     TSH + free T4   [name] is currently taking levothyroxine  100 mcg po qd. Patient is currently biochemically hypothyroid.  Educated on thyroid  axis.  Recommend the following: Take levothyroxine  112 mcg every morning. Advised to take levothyroxine  first thing in the morning on empty stomach and wait at least 30 minutes to 1 hour before eating or drinking anything or taking any other medications. Space out levothyroxine  by 4 hours from any acid reflux medication/fibrate/iron/calcium /multivitamin. Advised to take birth control pills and nutritional supplements in the evening. Repeat lab before next visit or sooner if symptoms of hyperthyroidism or hypothyroidism develop.  Notify us  immediately in case of pregnancy/breastfeeding or significant weight gain or loss. Counseled on compliance and follow up needs.  No baseline thyroid  ultrasound ?thyroid  nodule on exam 08/2023 Thyroid  U/S images and report reviewed: 1.2 x 0.7 x 1.1 cm mixed solid cystic isoechoic RIGHT inferior thyroid  nodule (TI-RADS 2) does not meet criteria for imaging or FNA.  I have reviewed current medications, nurse's notes, allergies, vital signs, past medical and surgical history, family medical history, and social history for this encounter. Counseled patient on symptoms, examination findings, lab findings, imaging results, treatment decisions and monitoring and prognosis. The patient understood the recommendations and agrees with the treatment plan. All questions regarding  treatment plan were fully answered.   Return in about 3 months (around 02/21/2024) for visit + labs before next visit.   Garrett Birmingham, MD  11/21/23   I have reviewed current medications, nurse's notes, allergies, vital signs, past medical and surgical history, family medical history, and social history for this encounter. Counseled patient on symptoms, examination findings, lab findings, imaging results, treatment decisions and monitoring and prognosis. The patient understood the recommendations and agrees with the treatment plan. All questions regarding treatment plan were fully answered.   History of Present Illness Garrett Roberts is a 68 y.o. year old male who presents to our clinic with hypothyroidism diagnosed in 2024?SABRA    Symptoms suggestive of HYPOTHYROIDISM:  fatigue Yes weight gain No cold intolerance  No constipation  Yes  Symptoms suggestive of HYPERTHYROIDISM:  weight loss  Yes heat intolerance No hyperdefecation  No palpitations  No  Compressive symptoms:  dysphagia  No dysphonia  No positional dyspnea (especially with simultaneous arms elevation)  No  Smokes  No On biotin  No Personal history of head/neck surgery/irradiation  No  Physical Exam  BP 100/80   Pulse 74   Ht 5' 9 (1.753 m)   Wt 144 lb (65.3 kg)   SpO2 96%   BMI 21.27 kg/m  Constitutional: well developed, well nourished Head: normocephalic, atraumatic, no exophthalmos Eyes: sclera anicteric, no redness Neck: no thyromegaly, no thyroid  tenderness; no nodule palpated Lungs: normal respiratory effort Neurology: alert and oriented Skin: dry, no appreciable rashes Musculoskeletal: no appreciable defects Psychiatric: normal mood and affect  Allergies Allergies  Allergen Reactions   Bovine (Beef) Protein-Containing Drug Products    Porcine (Pork) Protein-Containing Drug Products Other (See Comments)    Religious observance    Current  Medications Patient's Medications  New  Prescriptions   LEVOTHYROXINE  (SYNTHROID ) 112 MCG TABLET    Take 1 tablet (112 mcg total) by mouth daily.  Previous Medications   ACETAMINOPHEN  (TYLENOL ) 325 MG TABLET    Take 1-2 tablets (325-650 mg total) by mouth every 4 (four) hours as needed for mild pain (pain score 1-3).   AMIODARONE  (PACERONE ) 200 MG TABLET    Take 1/2 tablet (100 mg total) by mouth daily.   APIXABAN  (ELIQUIS ) 5 MG TABS TABLET    Take 1 tablet (5 mg total) by mouth 2 (two) times daily.   CLOPIDOGREL  (PLAVIX ) 75 MG TABLET    Take 1 tablet (75 mg total) by mouth daily.   DIGOXIN  (LANOXIN ) 0.125 MG TABLET    Take 1 tablet (0.125 mg total) by mouth daily.   EMPAGLIFLOZIN  (JARDIANCE ) 10 MG TABS TABLET    Take 1 tablet (10 mg total) by mouth daily before breakfast.   EZETIMIBE  (ZETIA ) 10 MG TABLET    Take 1 tablet (10 mg total) by mouth daily.   LEVOTHYROXINE  (SYNTHROID ) 100 MCG TABLET    Take 1 tablet (100 mcg total) by mouth daily.   LINACLOTIDE  (LINZESS ) 72 MCG CAPSULE    Take 1 capsule (72 mcg total) by mouth daily before breakfast.   MELATONIN 5 MG TABS    Take 5 mg by mouth at bedtime as needed (sleep).   MIDODRINE  (PROAMATINE ) 5 MG TABLET    Take 1 tablet (5 mg total) by mouth 3 (three) times daily with meals.   PANTOPRAZOLE  (PROTONIX ) 40 MG TABLET    Take 1 tablet (40 mg total) by mouth 2 (two) times daily before a meal.   POTASSIUM CHLORIDE  SA (KLOR-CON  M) 20 MEQ TABLET    Take 3 tablets (60 mEq total) by mouth daily.   ROSUVASTATIN  (CRESTOR ) 40 MG TABLET    Take 40 mg by mouth daily.   TADALAFIL  (CIALIS ) 20 MG TABLET    Take 1 tablet (20 mg total) by mouth daily.   TORSEMIDE  (DEMADEX ) 20 MG TABLET    Take 3 tablets (60 mg total) by mouth daily.   VERICIGUAT (VERQUVO) 2.5 MG TABS    Take 1 tablet (2.5 mg total) by mouth daily.   VITAMIN D-VITAMIN K (VITAMIN K2-VITAMIN D3 PO)    Take 1 tablet by mouth daily with lunch.  Modified Medications   No medications on file  Discontinued Medications   No medications on file     Past Medical History Past Medical History:  Diagnosis Date   Acute ST elevation myocardial infarction (STEMI) of anterior wall (HCC) 12/10/2022   Acute ST elevation myocardial infarction (STEMI) of inferior wall (HCC) 10/30/2021   Atrial fibrillation (HCC)    Atrial fibrillation with RVR (HCC) 12/19/2022   CHF (congestive heart failure) (HCC)    Coronary artery disease    Diabetes mellitus without complication (HCC)    DM (diabetes mellitus) (HCC)    H. pylori infection    Heart attack (HCC)    HLD (hyperlipidemia)    Hypertension    ST elevation myocardial infarction involving left anterior descending (LAD) coronary artery (HCC) 12/10/2022    Past Surgical History Past Surgical History:  Procedure Laterality Date   CARDIOVERSION N/A 12/14/2022   Procedure: CARDIOVERSION;  Surgeon: Kate Lonni CROME, MD;  [facility] INVAS[facility] CV LAB;  Service: Cardiovascular;  Laterality: N/A;   CORONARY/GRAFT ACUTE MI REVASCULARIZATION N/A 10/30/2021   Procedure: Coronary/Graft Acute MI Revascularization;  Surgeon: Ladona Heinz, MD;  Location: [facility] INVASIVE CV LAB;  Service: Cardiovascular;  Laterality: N/A;   CORONARY/GRAFT ACUTE MI REVASCULARIZATION N/A 12/10/2022   Procedure: Coronary/Graft Acute MI Revascularization;  Surgeon: Wonda Sharper, MD;  Location: [facility] INVASIVE CV LAB;  Service: Cardiovascular;  Laterality: N/A;   ESOPHAGOGASTRODUODENOSCOPY N/A 03/23/2023   Procedure: EGD (ESOPHAGOGASTRODUODENOSCOPY);  Surgeon: Stacia Glendia BRAVO, MD;  Location: [facility] ENDOSCOPY;  Service: Gastroenterology;  Laterality: N/A;   ICD IMPLANT N/A 06/01/2023   Procedure: ICD IMPLANT;  Surgeon: Kennyth Chew, MD;  Location: [facility] INVASIVE CV LAB;  Service: Cardiovascular;  Laterality: N/A;   LEFT HEART CATH AND CORONARY ANGIOGRAPHY N/A 10/30/2021   Procedure: LEFT HEART CATH AND CORONARY ANGIOGRAPHY;  Surgeon: Ladona Heinz, MD;  Location: [facility] INVASIVE CV LAB;  Service: Cardiovascular;  Laterality: N/A;   LEFT  HEART CATH AND CORONARY ANGIOGRAPHY N/A 12/10/2022   Procedure: LEFT HEART CATH AND CORONARY ANGIOGRAPHY;  Surgeon: Wonda Sharper, MD;  Location: [facility] INVASIVE CV LAB;  Service: Cardiovascular;  Laterality: N/A;   RIGHT HEART CATH N/A 03/20/2023   Procedure: RIGHT HEART CATH;  Surgeon: Rolan Ezra RAMAN, MD;  Location: [facility] INVASIVE CV LAB;  Service: Cardiovascular;  Laterality: N/A;   RIGHT HEART CATH N/A 06/30/2023   Procedure: RIGHT HEART CATH;  Surgeon: Cherrie Toribio SAUNDERS, MD;  Location: [facility] INVASIVE CV LAB;  Service: Cardiovascular;  Laterality: N/A;    Family History family history includes Chronic Renal Failure in his brother and father; Diabetes in his brother, brother, father, and mother; Heart attack in his brother; Stroke in his brother.  Social History Social History   Socioeconomic History   Marital status: Married    Spouse name: Hermenia   Number of children: 5   Years of education: Not on file   Highest education level: Bachelor's degree (e.g., BA, AB, BS)  Occupational History   Occupation: part time  Tobacco Use   Smoking status: Never   Smokeless tobacco: Never  Vaping Use   Vaping status: Never Used  Substance and Sexual Activity   Alcohol use: No   Drug use: No   Sexual activity: Not on file  Other Topics Concern   Not on file  Social History Narrative   ** Merged History Encounter **       Social Drivers of Health   Financial Resource Strain: Medium Risk (09/11/2023)   Overall Financial Resource Strain (CARDIA)    Difficulty of Paying Living Expenses: Somewhat hard  Food Insecurity: Food Insecurity Present (09/11/2023)   Hunger Vital Sign    Worried About Running Out of Food in the Last Year: Sometimes true    Ran Out of Food in the Last Year: Sometimes true  Transportation Needs: No Transportation Needs (09/11/2023)   PRAPARE - Administrator, Civil Service (Medical): No    Lack of Transportation (Non-Medical): No  Physical Activity:  Insufficiently Active (09/11/2023)   Exercise Vital Sign    Days of Exercise per Week: 2 days    Minutes of Exercise per Session: 40 min  Stress: No Stress Concern Present (09/11/2023)   [employer] of Occupational Health - Occupational Stress Questionnaire    Feeling of Stress: Only a little  Social Connections: Socially Integrated (09/11/2023)   Social Connection and Isolation Panel    Frequency of Communication with Friends and Family: More than three times a week    Frequency of Social Gatherings with Friends and Family: Twice a week    Attends Religious Services: More than 4 times per year  Active Member of Clubs or Organizations: Yes    Attends Banker Meetings: More than 4 times per year    Marital Status: Married  Catering Manager Violence: Not At Risk (07/12/2023)   Humiliation, Afraid, Rape, and Kick questionnaire    Fear of Current or Ex-Partner: No    Emotionally Abused: No    Physically Abused: No    Sexually Abused: No    Laboratory Investigations Lab Results  Component Value Date   TSH 7.974 (H) 10/20/2023   TSH 9.220 (H) 07/19/2023   TSH 14.882 (H) 07/05/2023   FREET4 1.6 08/17/2023   FREET4 1.03 07/05/2023   FREET4 0.71 06/13/2023     No results found for: TSI   No components found for: TRAB   Lab Results  Component Value Date   CHOL 115 07/04/2023   Lab Results  Component Value Date   HDL 46 07/04/2023   Lab Results  Component Value Date   LDLCALC 58 07/04/2023   Lab Results  Component Value Date   TRIG 55 07/04/2023   Lab Results  Component Value Date   CHOLHDL 2.5 07/04/2023   Lab Results  Component Value Date   CREATININE 1.22 11/01/2023   Lab Results  Component Value Date   GFR 52.40 (L) 07/06/2023      Component Value Date/Time   NA 135 11/01/2023 1344   NA 140 08/29/2023 0000   K 3.9 11/01/2023 1344   CL 103 11/01/2023 1344   CO2 25 11/01/2023 1344   GLUCOSE 122 (H) 11/01/2023 1344   BUN 21 11/01/2023 1344    BUN 22 (A) 08/29/2023 0000   CREATININE 1.22 11/01/2023 1344   CALCIUM  9.2 11/01/2023 1344   PROT 8.6 (H) 10/20/2023 1325   ALBUMIN  4.1 10/20/2023 1325   AST 29 10/20/2023 1325   ALT 26 10/20/2023 1325   ALKPHOS 106 10/20/2023 1325   BILITOT 1.1 10/20/2023 1325   GFRNONAA >60 11/01/2023 1344   GFRAA >90 09/25/2012 1823      Latest Ref Rng & Units 11/01/2023    1:44 PM 10/20/2023    1:25 PM 09/06/2023   10:45 AM  BMP  Glucose 70 - 99 mg/dL 877  849  876   BUN 8 - 23 mg/dL 21  23  16    Creatinine 0.61 - 1.24 mg/dL 8.77  8.69  8.65   Sodium 135 - 145 mmol/L 135  135  134   Potassium 3.5 - 5.1 mmol/L 3.9  4.4  3.8   Chloride 98 - 111 mmol/L 103  100  97   CO2 22 - 32 mmol/L 25  22  26    Calcium  8.9 - 10.3 mg/dL 9.2  9.7  9.4        Component Value Date/Time   WBC 4.6 10/20/2023 1325   RBC 5.12 10/20/2023 1325   HGB 12.9 (L) 10/20/2023 1325   HGB 13.8 05/29/2023 1142   HCT 41.7 10/20/2023 1325   HCT 42.4 05/29/2023 1142   PLT 309 10/20/2023 1325   PLT 236 05/29/2023 1142   MCV 81.4 10/20/2023 1325   MCV 90 05/29/2023 1142   MCH 25.2 (L) 10/20/2023 1325   MCHC 30.9 10/20/2023 1325   RDW 15.4 10/20/2023 1325   RDW 14.4 05/29/2023 1142   LYMPHSABS 1.6 09/07/2023 0951   LYMPHSABS 2.3 05/29/2023 1142   MONOABS 0.7 09/07/2023 0951   EOSABS 0.1 09/07/2023 0951   EOSABS 0.2 05/29/2023 1142   BASOSABS 0.0 09/07/2023 0951  BASOSABS 0.1 05/29/2023 1142      Parts of this note may have been dictated using voice recognition software. There may be variances in spelling and vocabulary which are unintentional. Not all errors are proofread. Please notify the dino if any discrepancies are noted or if the meaning of any statement is not clear.

## 2023-11-27 ENCOUNTER — Ambulatory Visit (HOSPITAL_COMMUNITY)
Admission: RE | Admit: 2023-11-27 | Discharge: 2023-11-27 | Disposition: A | Discharge status: Home / Self Care | Source: Ambulatory Visit | Attending: Cardiology | Admitting: Cardiology

## 2023-11-27 ENCOUNTER — Other Ambulatory Visit (HOSPITAL_COMMUNITY): Payer: Self-pay

## 2023-11-27 ENCOUNTER — Ambulatory Visit (HOSPITAL_COMMUNITY): Payer: Self-pay | Admitting: Cardiology

## 2023-11-27 ENCOUNTER — Other Ambulatory Visit (HOSPITAL_COMMUNITY): Payer: Self-pay | Admitting: Cardiology

## 2023-11-27 VITALS — BP 106/76 | HR 83 | Wt 141.8 lb

## 2023-11-27 DIAGNOSIS — I110 Hypertensive heart disease with heart failure: Secondary | ICD-10-CM | POA: Diagnosis not present

## 2023-11-27 DIAGNOSIS — Z955 Presence of coronary angioplasty implant and graft: Secondary | ICD-10-CM | POA: Insufficient documentation

## 2023-11-27 DIAGNOSIS — Z95810 Presence of automatic (implantable) cardiac defibrillator: Secondary | ICD-10-CM | POA: Diagnosis not present

## 2023-11-27 DIAGNOSIS — E7800 Pure hypercholesterolemia, unspecified: Secondary | ICD-10-CM | POA: Insufficient documentation

## 2023-11-27 DIAGNOSIS — D509 Iron deficiency anemia, unspecified: Secondary | ICD-10-CM | POA: Insufficient documentation

## 2023-11-27 DIAGNOSIS — Z7901 Long term (current) use of anticoagulants: Secondary | ICD-10-CM | POA: Insufficient documentation

## 2023-11-27 DIAGNOSIS — E785 Hyperlipidemia, unspecified: Secondary | ICD-10-CM | POA: Diagnosis not present

## 2023-11-27 DIAGNOSIS — I255 Ischemic cardiomyopathy: Secondary | ICD-10-CM | POA: Diagnosis not present

## 2023-11-27 DIAGNOSIS — Z7984 Long term (current) use of oral hypoglycemic drugs: Secondary | ICD-10-CM | POA: Diagnosis not present

## 2023-11-27 DIAGNOSIS — Z79899 Other long term (current) drug therapy: Secondary | ICD-10-CM | POA: Insufficient documentation

## 2023-11-27 DIAGNOSIS — Z7902 Long term (current) use of antithrombotics/antiplatelets: Secondary | ICD-10-CM | POA: Insufficient documentation

## 2023-11-27 DIAGNOSIS — I480 Paroxysmal atrial fibrillation: Secondary | ICD-10-CM | POA: Diagnosis not present

## 2023-11-27 DIAGNOSIS — J479 Bronchiectasis, uncomplicated: Secondary | ICD-10-CM | POA: Insufficient documentation

## 2023-11-27 DIAGNOSIS — I272 Pulmonary hypertension, unspecified: Secondary | ICD-10-CM | POA: Insufficient documentation

## 2023-11-27 DIAGNOSIS — I5022 Chronic systolic (congestive) heart failure: Secondary | ICD-10-CM | POA: Insufficient documentation

## 2023-11-27 DIAGNOSIS — I252 Old myocardial infarction: Secondary | ICD-10-CM | POA: Diagnosis not present

## 2023-11-27 DIAGNOSIS — J90 Pleural effusion, not elsewhere classified: Secondary | ICD-10-CM | POA: Diagnosis not present

## 2023-11-27 DIAGNOSIS — Z79890 Hormone replacement therapy (postmenopausal): Secondary | ICD-10-CM | POA: Insufficient documentation

## 2023-11-27 DIAGNOSIS — I340 Nonrheumatic mitral (valve) insufficiency: Secondary | ICD-10-CM | POA: Insufficient documentation

## 2023-11-27 DIAGNOSIS — E039 Hypothyroidism, unspecified: Secondary | ICD-10-CM | POA: Diagnosis not present

## 2023-11-27 DIAGNOSIS — E119 Type 2 diabetes mellitus without complications: Secondary | ICD-10-CM | POA: Insufficient documentation

## 2023-11-27 LAB — LIPID PANEL
Cholesterol: 158 mg/dL (ref 0–200)
HDL: 62 mg/dL (ref >40)
LDL Cholesterol: 84 mg/dL (ref 0–99)
Total CHOL/HDL Ratio: 2.5 ratio
Triglycerides: 60 mg/dL (ref <150)
VLDL: 12 mg/dL (ref 0–40)

## 2023-11-27 LAB — LACTATE DEHYDROGENASE: LDH: 179 U/L (ref 105–235)

## 2023-11-27 LAB — COMPREHENSIVE METABOLIC PANEL WITH GFR
ALT: 25 U/L (ref 0–44)
AST: 25 U/L (ref 15–41)
Albumin: 3.7 g/dL (ref 3.5–5.0)
Alkaline Phosphatase: 98 U/L (ref 38–126)
Anion gap: 13 (ref 5–15)
BUN: 20 mg/dL (ref 8–23)
CO2: 25 mmol/L (ref 22–32)
Calcium: 9.2 mg/dL (ref 8.9–10.3)
Chloride: 101 mmol/L (ref 98–111)
Creatinine, Ser: 1.54 mg/dL — ABNORMAL HIGH (ref 0.61–1.24)
GFR, Estimated: 49 mL/min — ABNORMAL LOW (ref >60)
Glucose, Bld: 212 mg/dL — ABNORMAL HIGH (ref 70–99)
Potassium: 4.4 mmol/L (ref 3.5–5.1)
Sodium: 139 mmol/L (ref 135–145)
Total Bilirubin: 1.1 mg/dL (ref 0.0–1.2)
Total Protein: 7.6 g/dL (ref 6.5–8.1)

## 2023-11-27 LAB — DIGOXIN LEVEL: Digoxin Level: <0.6 ng/mL — ABNORMAL LOW (ref 0.8–2.0)

## 2023-11-27 LAB — TSH: TSH: 5.774 u[IU]/mL — ABNORMAL HIGH (ref 0.350–4.500)

## 2023-11-27 MED ORDER — VERQUVO 2.5 MG PO TABS
5.0000 mg | ORAL_TABLET | Freq: Every day | ORAL | 3 refills | Status: DC
  Filled 2023-11-27 – 2023-12-04 (×2): qty 30, 15d supply, fill #0
  Filled 2023-12-14 – 2023-12-29 (×3): qty 30, 15d supply, fill #1

## 2023-11-27 NOTE — Progress Notes (Signed)
 Patient presents for 3 week follow up in [facility] Clinic today alone.  Patient ambulated into clinic independently. States he is feeling much better since last visit. Denies lightheadedness, dizziness, falls, shortness of breath, and signs of bleeding. Patient states his GI symptoms have seemed to resolved. Virtual colonoscopy negative 10/23 see impression below. Pt has f/u with GI 12/1.  IMPRESSION: 1. No polyp, stricture or mass. 2. Large right pleural effusion. 3. Prostate may be minimally enlarged. 4.  Aortic atherosclerosis (ICD10-I70.0).  Per Dr. Rolan will place orders for IR guided thoracentesis with cultures and cytology.   Pt taking all medication as prescribed. PA obtained for [facility] 10/22. Pt has been taking it for about 2 weeks now without issue. Per Dr. Rolan pt to increase to 5mg  daily.   Pt unable to complete CPX due to a fall early October. Dr. Rolan stresses importance of rescheduling for objective data to see readiness for VAD. Pt in agreement to reschedule CPX in 2 weeks.  Pt postponed VAD due to his daughter getting married in late December in [place]. Pt originally did not think he felt good enough to go but is now considering it. If pt continues to need VAD he would like to discuss after his daughters wedding in the new year.  Labs ordered per Dr. Rolan today CMET LDH Lipid Panel TSH Free T3 T4 Digoxin   Vital Signs:  HR: 83 BP: 106/76 (86) SPO2: 99%   Weight: 141.8 lb  Last weight: 143.8 lb   Symptom YES NO DETAILS  Angina  x Activity:  Claudication  x How Far:  Syncope  x When:  Stroke  x   Orthopnea  x How many pillows: 3-4  PND  x How often:  CPAP  x How many hours:  Pedal Edema  x daily  Abdominal Fullness  x   Nausea / Vomit  x Increased appetite  Diaphoresis  x When:  Shortness of Breath  x Activity:  Palpitations  x When:  ICD shock  x   Bleeding S/S  x   Tea-colored Urine  x   Hospitalizations  x   Emergency Room  x   Other MD     Activity  Able to walk consistently for 40 minutes without issue  Fluid   Diet Increased appetite    Device: Biotronik Therapies: on  Last check: 10/18/2023  Addendum: Pt called following our appointment to go over scheduling details for CPX and Thoracentesis. Thoracentesis scheduled tomorrow 11/18 at 1pm pt to arrive at 12:30 and check in on 1st floor in Radiology. CPX scheduled for 12/3 at 11:00 pt advised to wear comfortable clothes and tennis shoes.  Patient Instructions:  Increase Verquvo to 5mg  daily Orders placed for IR guided thoracentesis for pleural effusion 3. CPX scheduled for 12/3 4. Return in 6 weeks for f/u with Dr. Rolan. Will review CPX results at this visit.  Schuyler Lunger RN, BSN VAD Coordinator 24/7 Pager [phone]

## 2023-11-27 NOTE — Progress Notes (Signed)
 Advanced Heart Failure Clinic Note  PCP: Vincente Shivers, NP HF Cardiology: Dr. Rolan  Chief complaint: CHF  68 y.o. with history of CAD, DM II, HTN, HLD, ischemic cardiomyopathy/chronic systolic CHF.   Patient had inferior STEMI 10/23 s/p PCI/DES to RCA. Had residual diffuse disease in diagonals and OMs. EF 40-45% at time of cath. Subsequent echo in 10/23 with EF 55-60%.   He was not taking medications in 2024 after losing insurance. Presented with anterior STEMI 12/10/22. Cardiac cath with 100% proximal LAD treated with PCI/DES. Prior RCA stent patent, severe disease in tortuous ramus and distal LCx treated medically.  Echo with EF 25%, no LV thrombus, AK mid septum to apex, moderately reduced RV systolic function. Course complicated by atrial fibrillation with RVR. Started on IV amiodarone  and underwent DCCV to SR.  GDMT limited by soft blood pressure. Also seen by GI for abdominal pain. Had thickening of stomach on CT scan. H. Pylori later resulted positive after discharge, seen by GI and started on treatment for H pylori-related gastritis.   Returned to ED 12/19/22 with recurrent atrial fibrillation. He converted to SR while in waiting room and was discharged home.  Admitted 2/25 with acute CHF. Diuresed with IV lasix . GDMT cut back 2/2 to low BP, Entresto  and spironolactone  stopped. He was discharged home, weight 154 lbs.  Echo in 2/25 showed EF 25% with mild RV dysfunction and mild RV enlargement, PASP 60 mmHg, moderate MR.   He was seen again in the ER 03/14/23 with worsening fatigue and dyspnea.  Torsemide  was increased to 40 qam/20 qpm.   Patient was set up for RHC in 3/25 due to ongoing severe fatigue. This showed low CI 2.02 by thermodilution as well as moderate pulmonary arterial hypertension, he was admitted and started on milrinone  gtt.  He was gradually titrated off milrinone  and maintained a good co-ox off milrinone .  He had a moderate right pleural effusion, underwent right  thoracentesis.  Fluid was transudative but grew E coli, he was treated with Bactrim  based on sensitivities.  Given early satiety and poor appetite, he had an EGD that showed gastritic, biopsies showed no H pylori or gastric cancer.  HRCT chest showed bibasilar bronchiectasis, ?aspiration. However, swallow study was not suggestive of aspiration. V/Q scan was done due to pulmonary hypertension, this showed no chronic PE.  He was started on sildenafil  and meds were titrated, he felt better when he went home.   Echo in 3/25 showed EF 25-30%, mild RV dysfunction, moderate MR, PASP 62 mmHg.   Biotronik ICD placed in 5/25.  Patient was admitted again in 6/25 with dyspnea and fatigue.  BP was low, midodrine  was started and spironolactone  and tadalafil  were stopped.  RHC again showed mixed pulmonary arterial/venous hypertension and low CI at 2.1 by Fick and thermodilution.  Echo showed EF 20-25%, mild LV dilation, moderate RV dysfunction, normal RV size, PASP 44 mmHg, mild-moderate MR.    Patient returns for followup of CHF.  Virtual colonoscopy showed no colonic lesions but did show a large right pleural effusion.  BP is stable, no lightheadedness.  He is still using midodrine .  GI symptoms are improved and he is eating more,  weight up 1 lb.  He feels like his gut is almost normal.  He is doing better from a cardiopulmonary standpoint as well, says he walks 40 minutes/day without dyspnea.  He can climb a flight of stairs without dyspnea.  No chest pain.    Labs (12/24): hgb  15.1, BNP 350, K 4.4, creatinine 1.24 Labs (2/25): K 4.1, creatinine 1.48, BNP 481, LFTs normal, free T3/T4 normal Labs (3/25): K 3.5, creatinine 1.44 => 1.27, hgb 13.4, BNP 812, HS-TnI 44 => 39 Labs (6/25): K 4, creatinine 1.39 Labs (7/25): digoxin  0.8, K 4.1, creatinine 1.46, BNP 1374 Labs (8/25): Transferrin saturation 6.5%, hgb 12.9, LFTs normal, K 3.8, creatinine 1.34, BNP 1478 Labs (10/25): Ferritin 19, transferrin saturation 6%,  digoxin  < 0.6, TSH 7.9, BNP 1887, K 4.4, creatinine 1.3 => 1.22, LFTs normal, hgb 12.9  SH: No ETOH or tobacco use.  Lives at home in [place] with his wife and children. Originally from [place].   PMH: 1. Chronic systolic CHF: Ischemic cardiomyopathy.  Biotronik ICD.  - Echo (12/24): EF 20-25%, LAD territory WMAs, moderate LVH, moderately decreased RV systolic function, mild MR.  - Echo (2/25): EF 25% with mild RV dysfunction and mild RV enlargement, PASP 60 mmHg, moderate MR.  - RHC (3/25): mean RA 2, PA 60/18 mean 31, mean PCWP 17, CI 2.02 thermo, CI 2.52 Fick, PAPi 21, PVR 3.7 WU.  - Echo (3/25): EF 25-30%, mild RV dysfunction, moderate MR, PASP 62 mmHg.  - Echo (6/25): EF 20-25%, mild LV dilation, moderate RV dysfunction, normal RV size, PASP 44 mmHg, mild-moderate MR. - RHC (6/25): mean RA 5, PA 62/21 mean 35, mean PCWP 23, CI 2.1 by Fick and thermo, PVR 3.0 WU.  2. CAD: Inferior STEMI 10/23 with DES to RCA.  - Anterior STEMI (11/24): Occluded proximal LAD treated with DES, occluded D2, 90% OM1, 95% distal LCx.  Distal LCx and OM1 treated medically.  3. Atrial fibrillation: Paroxysmal.  - DCCV in 12/24.  4. H pylori gastritis 5. Hyperlipidemia 6. Type 2 diabetes 7. Pulmonary hypertension: RHC (3/25) with mean RA 2, PA 60/18 mean 31, mean PCWP 17, CI 2.02 thermo, CI 2.52 Fick, PAPi 21, PVR 3.7 WU. - V/Q scan (3/25): No chronic PE.  - HRCT chest (3/25): bibasilar bronchiectasis, ?aspiration.  - ANA, RF, anti-SCL70 Ab, anti-centromere Ab all negative.  8. Pleural effusion: On right, s/p thoracentesis.  9. Gastric emptying study in 9/25: normal 10. Fe deficiency anemia  Current Outpatient Medications  Medication Sig Dispense Refill   acetaminophen  (TYLENOL ) 325 MG tablet Take 1-2 tablets (325-650 mg total) by mouth every 4 (four) hours as needed for mild pain (pain score 1-3).     amiodarone  (PACERONE ) 200 MG tablet Take 1/2 tablet (100 mg total) by mouth daily. 30 tablet 5    apixaban  (ELIQUIS ) 5 MG TABS tablet Take 1 tablet (5 mg total) by mouth 2 (two) times daily.     clopidogrel  (PLAVIX ) 75 MG tablet Take 1 tablet (75 mg total) by mouth daily. 90 tablet 3   digoxin  (LANOXIN ) 0.125 MG tablet Take 1 tablet (0.125 mg total) by mouth daily. 90 tablet 3   empagliflozin  (JARDIANCE ) 10 MG TABS tablet Take 1 tablet (10 mg total) by mouth daily before breakfast.     ezetimibe  (ZETIA ) 10 MG tablet Take 1 tablet (10 mg total) by mouth daily. 90 tablet 3   levothyroxine  (SYNTHROID ) 112 MCG tablet Take 1 tablet (112 mcg total) by mouth daily. 90 tablet 1   linaclotide  (LINZESS ) 72 MCG capsule Take 1 capsule (72 mcg total) by mouth daily before breakfast. 90 capsule 3   melatonin 5 MG TABS Take 5 mg by mouth at bedtime as needed (sleep).     midodrine  (PROAMATINE ) 5 MG tablet Take 1 tablet (5 mg total)  by mouth 3 (three) times daily with meals. 90 tablet 0   pantoprazole  (PROTONIX ) 40 MG tablet Take 1 tablet (40 mg total) by mouth 2 (two) times daily before a meal. 180 tablet 3   potassium chloride  SA (KLOR-CON  M) 20 MEQ tablet Take 3 tablets (60 mEq total) by mouth daily. 180 tablet 6   rosuvastatin  (CRESTOR ) 40 MG tablet Take 40 mg by mouth daily.     tadalafil  (CIALIS ) 20 MG tablet Take 1 tablet (20 mg total) by mouth daily. 90 tablet 3   torsemide  (DEMADEX ) 20 MG tablet Take 3 tablets (60 mg total) by mouth daily. 180 tablet 6   Vitamin D-Vitamin K (VITAMIN K2-VITAMIN D3 PO) Take 1 tablet by mouth daily with lunch.     JARDIANCE  25 MG TABS tablet TAKE ONE TABLET BY MOUTH DAILY 90 tablet 2   Vericiguat (VERQUVO) 2.5 MG TABS Take 2 tablets (5 mg total) by mouth daily. 30 tablet 3   No current facility-administered medications for this encounter.   Allergies  Allergen Reactions   Bovine (Beef) Protein-Containing Drug Products    Porcine (Pork) Protein-Containing Drug Products Other (See Comments)    Religious observance   Family History  Problem Relation Age of Onset    Diabetes Mother    Chronic Renal Failure Father    Diabetes Father    Chronic Renal Failure Brother    Diabetes Brother    Diabetes Brother    Heart attack Brother    Stroke Brother    Wt Readings from Last 3 Encounters:  11/27/23 64.3 kg (141 lb 12.8 oz)  11/21/23 65.3 kg (144 lb)  11/01/23 63.6 kg (140 lb 3.2 oz)  BP 106/76 Comment: 86  Pulse 83   Wt 64.3 kg (141 lb 12.8 oz)   SpO2 99%   BMI 20.94 kg/m   PHYSICAL EXAM: General: NAD Neck: No JVD, no thyromegaly or thyroid  nodule.  Lungs: Decreased BS on right.  CV: Nondisplaced PMI.  Heart regular S1/S2, no S3/S4, no murmur.  No peripheral edema.  No carotid bruit.  Normal pedal pulses.  Abdomen: Soft, nontender, no hepatosplenomegaly, no distention.  Skin: Intact without lesions or rashes.  Neurologic: Alert and oriented x 3.  Psych: Normal affect. Extremities: No clubbing or cyanosis.  HEENT: Normal.    ASSESSMENT & PLAN: 1. Chronic systolic CHF/ischemic cardiomyopathy:  Biotronik ICD.  Echo 12/24 with EF 20-25%, no LV thrombus, AK mid septum to apex, moderately reduced RV systolic function. Echo 2/25 with EF 25%, mildly reduced RV function.  Low BP has limited GDMT titration.  His profound fatigue has been concerning for low output HF. RHC 3/25 showed low CI by thermo (2.05) but preserved by Fick.  Borderline elevated PCWP, normal RA pressure.  There was also moderate mixed pulmonary arterial/pulmonary venous hypertension.  With his significant symptoms, he was started on milrinone  0.25 and added sildenafil  to try to lower PA pressure.  Milrinone  was later weaned off with medication adjustment, and co-ox remained excellent.  He was readmitted in 6/25 with CHF; RHC showed moderate mixed pulmonary arterial/pulmonary venous hypertension with CI low at 2.1 by Fick and thermo.  Echo in 6/25 showed EF 20-25%, mild LV dilation, moderate RV dysfunction, normal RV size, PASP 44 mmHg, mild-moderate MR.  We have had to peel back on GDMT  due to low BP and he is now taking midodrine .  With low output heart failure (CI 2.1 on last RHC and inability to tolerate GDMT - now on  midodrine ), we have been discussing advanced therapies.  He is not interested in transplant and at his age, it would be more difficult to obtain. I think he would be a good LVAD candidate. We have a window at this time while his RV function is not severely depressed and his renal function is relatively preserved.  He has completed workup.  We had him set up for LVAD surgery, but he decided that he wanted to wait longer (daughter to get married soon).  We will follow him very closely, I am concerned that we miss his window for LVAD implantation. Symptomatically, he is actually doing better (NYHA class II per his report).  He is not significantly volume overloaded on exam.  - Continue torsemide  60 mg daily and KCl 60 daily.  BMET/BNP today.  - Continue Jardiance  10 mg daily. - Continue digoxin  0.125 daily, check digoxin  level today.  - He is on midodrine  5 mg tid to maintain MAP.  Hopefully can start to cut back on this soon.  - off Toprol  XL with low output.  - Off spironolactone  with low BP.  - Increase Verquvo to 5 mg daily.  - I will arrange for CPX for objective evaluation of functional status.  2. CAD:  Hx inferior STEMI 10/23 s/p PCI/DES to RCA. Anterior STEMI 12/10/22, cath showed occluded pLAD treated with PCI/DES, prior RCA stent patent, severe disease OM1 and distal LCx treated medically. No chest pain.  - Continue Plavix .   - No ASA given Eliquis  use.  - Continue rosuvastatin  40 mg + Zetia  10 mg daily, lipids today.  3. Atrial fibrillation: Paroxysmal. Has been in NSR.  - continue Eliquis  5 mg bid  - Continue amiodarone  100 mg daily, recent LFTs normal with slightly elevated TSH => check LFTs, TSH, and free T4 today.  He will need a regular eye exam.  4. DM II: Continue Jardiance   5. 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 Not a smoker, no known lung disease. CXR has shown some chronic interstitial changes. HRCT chest with bibasilar bronchiectasis that may be due to aspiration (though swallow study negative).  V/Q scan showed no chronic PE. Rheumatologic serologies negative - Continue tadalafil  20 mg daily.   6. Pleural effusion: Moderate on right. S/p right thoracentesis 3/25. Pleural fluid was transudative by Light's criteria, no malignant cells. However, it grew E coli that was treated with antibiotics.  He has a recurrent right pleural effusion by exam, effusion was large on recent virtual colonoscopy.  He is minimally symptomatic.  - I will arrange for IR-guided right thoracentesis, will send again for cytology and fluid cultures.  7. GI: History of early satiety, GI discomfort.  CT abdomen/pelvis with gastric wall thickening. GI was consulted and EGD was performed, no definite evidence for malignancy, biopsies taken for H pylori testing and pathology => negative for H pylori and no gastric cancer.  Gastric emptying study normal in 9/25.  Abdominal US  showed normal appearing liver in 9/25.  Virtual colonoscopy unremarkable.  He actually is feeling better with improved appetite.  8. 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  - Continue Levoxyl , check TSH.  9. Fe deficiency anemia: He has had IV FE.   I spent 41 minutes reviewing records, interviewing/examining patient, and managing orders. Patient's daughter was present.   Followup 6 wks   Ezra Shuck 11/27/23

## 2023-11-27 NOTE — Patient Instructions (Addendum)
 Increase Verquvo to 5mg  daily Orders placed for IR guided thoracentesis for pleural effusion Schedule CPX Return in 6 weeks

## 2023-11-28 ENCOUNTER — Ambulatory Visit (HOSPITAL_COMMUNITY)
Admission: RE | Admit: 2023-11-28 | Discharge: 2023-11-28 | Disposition: A | Discharge status: Home / Self Care | Source: Ambulatory Visit | Attending: Cardiology | Admitting: Cardiology

## 2023-11-28 ENCOUNTER — Ambulatory Visit (HOSPITAL_COMMUNITY)
Admission: RE | Admit: 2023-11-28 | Discharge: 2023-11-28 | Disposition: A | Discharge status: Home / Self Care | Source: Ambulatory Visit | Attending: Radiology | Admitting: Radiology

## 2023-11-28 DIAGNOSIS — I5022 Chronic systolic (congestive) heart failure: Secondary | ICD-10-CM | POA: Insufficient documentation

## 2023-11-28 DIAGNOSIS — E7800 Pure hypercholesterolemia, unspecified: Secondary | ICD-10-CM | POA: Diagnosis not present

## 2023-11-28 DIAGNOSIS — J90 Pleural effusion, not elsewhere classified: Secondary | ICD-10-CM | POA: Insufficient documentation

## 2023-11-28 DIAGNOSIS — E039 Hypothyroidism, unspecified: Secondary | ICD-10-CM | POA: Insufficient documentation

## 2023-11-28 HISTORY — PX: IR THORACENTESIS ASP PLEURAL SPACE W/IMG GUIDE: IMG5380

## 2023-11-28 LAB — PROTEIN, PLEURAL OR PERITONEAL FLUID: Total protein, fluid: <3 g/dL

## 2023-11-28 LAB — T3, FREE: T3, Free: 2.5 pg/mL (ref 2.0–4.4)

## 2023-11-28 LAB — GRAM STAIN

## 2023-11-28 LAB — LACTATE DEHYDROGENASE, PLEURAL OR PERITONEAL FLUID: LD, Fluid: 41 U/L — ABNORMAL HIGH (ref 3–23)

## 2023-11-28 LAB — T4: T4, Total: 10.8 ug/dL (ref 4.5–12.0)

## 2023-11-28 MED ORDER — LIDOCAINE-EPINEPHRINE 1 %-1:100000 IJ SOLN
20.0000 mL | Freq: Once | INTRAMUSCULAR | Status: AC
  Administered 2023-11-28: 10 mL

## 2023-11-28 MED ORDER — LIDOCAINE-EPINEPHRINE 1 %-1:100000 IJ SOLN
INTRAMUSCULAR | Status: AC
  Filled 2023-11-28: qty 1

## 2023-11-28 NOTE — Procedures (Signed)
 Ultrasound-guided diagnostic and therapeutic right sided thoracentesis performed yielding 1.1 liters of straw colored fluid. No immediate complications.   Diagnostic fluid was sent to the lab for further analysis. Follow-up chest x-ray pending. EBL is < 2 ml.

## 2023-11-30 LAB — ACID FAST SMEAR (AFB, MYCOBACTERIA): Acid Fast Smear: NEGATIVE

## 2023-11-30 LAB — CYTOLOGY - NON PAP

## 2023-12-04 LAB — CULTURE, BODY FLUID W GRAM STAIN -BOTTLE: Culture: NO GROWTH

## 2023-12-05 ENCOUNTER — Encounter (HOSPITAL_COMMUNITY): Payer: Self-pay

## 2023-12-05 ENCOUNTER — Other Ambulatory Visit (HOSPITAL_COMMUNITY): Payer: Self-pay

## 2023-12-06 ENCOUNTER — Other Ambulatory Visit (HOSPITAL_COMMUNITY): Payer: Self-pay

## 2023-12-06 NOTE — Progress Notes (Signed)
 12/11/2023 Lydon Moravek [id number] October 19, 1955  Referring provider: Vincente Shivers, NP Primary GI doctor: Dr. Federico  ASSESSMENT AND PLAN:  IDA 10/20/2023  HGB 12.9 MCV 81.4 Platelets 309 10/20/2023 Iron 27 Ferritin 19 B12 921 Recent Labs    06/30/23 1419 06/30/23 1420 06/30/23 1424 07/02/23 0255 07/03/23 0308 07/04/23 0255 07/05/23 0248 07/06/23 1226 09/07/23 0951 10/20/23 1325  HGB 12.9* 15.6 15.6 14.0 13.5 14.1 14.4 13.9 12.9* 12.9*  Denies overt GI bleeding 11/06/2013 colonoscopy at [facility] for abdominal discomfort and lower abdominal pain good preparation  unremarkable recall 10 years 03/23/2023 EGD 4 cm HH normal esophagus granular gastric mucosa normal duodenum negative H. pylori negative malignancy did show focal intestinal metaplasia on no recommendation for routine surveillance CTAP March 2025 in the hospital unremarkable 11/06/2023 virtual colonoscopy unremarkable S/P IV  Iron, last infusion was 3 weeks ago - check iron/ ferritin - recheck CBC/iron/ferritin before next OV 3 months -could consider VCE if continuing anemia - consider referral to hematology for supportive care  IDA with constipation with discomfort, bloating, weight loss No melena, no hematochezia 11/06/2013 colonoscopy at [facility] for abdominal discomfort and lower abdominal pain good preparation  unremarkable recall 10 years 03/23/2023 EGD 4 cm HH normal esophagus granular gastric mucosa normal duodenum negative H. 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  S/p Biotronik ICD 5/25. 06/2023 EF 20-25%, no AS Being considered for LVAD with Dr. Vergil, last seen in the office 11/27/2023, wanting to wait till after the holidays and after his daughter gets married  Isolated elevated GGT, unremarkable CT abdominal ultrasound, likely secondary to heart failure and medications CT abdomen pelvis March 2025 in the hospital with normal liver 09/27/2023 RUQ US  liver unremarkable suspected minimal gallbladder sludge negative Murphy CBD 5 mm small right pleural effusion 03/23/2023 EGD 4 cm HH normal esophagus granular gastric mucosa normal duodenum negative H. pylori negative malignancy did show focal intestinal metaplasia on no recommendation for routine surveillance 09/27/2023 GES normal    Latest Ref Rng & Units 11/27/2023   10:33 AM 10/20/2023    1:25 PM 09/07/2023    9:51 AM  Hepatic Function  Total Protein 6.5 - 8.1 g/dL 7.6  8.6  8.0   Albumin  3.5 - 5.0 g/dL 3.7  4.1  4.2   AST 15 - 41 U/L 25  29  25    ALT 0 - 44 U/L 25  26  20    Alk Phosphatase 38 - 126 U/L 98  106  116   Total Bilirubin 0.0 - 1.2 mg/dL 1.1  1.1  0.7   Bilirubin, Direct 0.0 - 0.3 mg/dL   0.2    Platelets 762.9  INR 07/04/2023 2.1  GGT elevation normal LFTs unremarkable ultrasound Negative hepatitis panel 2025  History of H pylori, weight loss, decreased appetite possibly from acute illness/cardiac, EGD in the hospital negative H. Pylori Has had improvement of his symptoms on mirtazapine  and pantoprazole  03/2023 CT found to have questionable gastric  thickening history of H. pylori infection stool test treated with quadruple therapy 03/23/2023 EGD Dr. Stacia for abnormal CT showed normal esophagus, 4 cm HH, granular gastric mucosa normal duodenum no evidence of malignancy.   Path negative H. pylori negative dysplasia and malignancy did show  mild chronic gastritis and focal intestinal metaplasia, no recommendation for routine surveillance 04/2023 started on mirtazapine  50 mg at bedtime Continue pantoprazole  40 mg twice daily, denies GERD No plan to repeat EGD  CAD 10/2021 status post PCI RCA STEMI 12/10/22. Cardiac cath with 100% proximal LAD treated with PCI/DES. Prior RCA stent patent, severe disease in tortuous ramus and distal LCx treated medically.  On Plavix   A-fib On Eliquis   Transudative pleural effusion 03/2023 He had a moderate right pleural effusion, underwent right thoracentesis.  Fluid was transudative but grew E coli, he was treated with Bactrim  based on sensitivities.  11/28/2023 status post IR thoracentesis 1.1 L removed, no malignant cells, negative AFB, negative Gram stain and culture  Wt Readings from Last 10 Encounters:  12/11/23 144 lb 6 oz (65.5 kg)  11/27/23 141 lb 12.8 oz (64.3 kg)  11/21/23 144 lb (65.3 kg)  11/01/23 140 lb 3.2 oz (63.6 kg)  10/23/23 141 lb (64 kg)  10/20/23 143 lb (64.9 kg)  09/12/23 143 lb (64.9 kg)  09/07/23 144 lb 4 oz (65.4 kg)  09/01/23 143 lb (64.9 kg)  09/01/23 142 lb (64.4 kg)    Patient Care Team: Vincente Shivers, NP as PCP - General (General Practice) Elmira Newman PARAS, MD as PCP - Cardiology (Cardiology) Kennyth Chew, MD as PCP - Electrophysiology (Cardiology)  HISTORY OF PRESENT ILLNESS: 68 y.o. male with a past medical history coronary artery disease status post non-STEMI 2023 PCI DES to RCA, heart failure ejection fraction 40 to 45%, diabetes hypertension and others listed below presents for follow-up for constipation.  I last saw the patient in the office 10/23/2023, patient had virtual diagnostic colonoscopy for IDA that showed no polyp strictures or masses.  Did show large right pleural effusion.  Discussed the use of AI scribe software for clinical note transcription with the patient, who gave verbal consent to proceed.  History of Present Illness    Laterrian Barkow Rosevelt is a 68 year old male who presents with abdominal pain and constipation.  He experiences significant abdominal discomfort and constipation, currently managed with Linzess  72 mcg daily. Despite this, he continues to have severe pain immediately after bowel movements, lasting two to three hours. The pain is described as intense, occurring right after defecation, and does not radiate. Bowel movements occur every other day or every three days, and the pain significantly impacts his ability to walk.  He has a history of anemia and has received two IV iron infusions, the last one three weeks ago, with no notable improvement.  A virtual colonoscopy showed no polyps or masses. He reports a significant weight loss of 42 to 45 pounds over the past year, following a previous heart attack and other health issues.  No bloating, but he describes a sensation of stiffness or a 'ball' in the abdomen, particularly near the belly button, without associated pain. No improvement in breathing after fluid removal.      Wt Readings from Last 20 Encounters:  12/11/23 144 lb 6 oz (65.5 kg)  11/27/23 141 lb 12.8 oz (64.3 kg)  11/21/23 144 lb (65.3 kg)  11/01/23 140 lb 3.2 oz (63.6 kg)  10/23/23 141 lb (64 kg)  10/20/23 143 lb (64.9 kg)  09/12/23 143 lb (64.9 kg)  09/07/23 144 lb 4 oz (65.4 kg)  09/01/23 143 lb (64.9 kg)  09/01/23 142 lb (64.4 kg)  08/18/23 144 lb (65.3 kg)  08/17/23 142 lb (64.4 kg)  07/19/23 141 lb 12.8 oz (64.3 kg)  07/12/23 139 lb (63 kg)  07/12/23 139 lb 9.6 oz (63.3 kg)  07/06/23 143 lb (64.9 kg)  07/05/23 141 lb 11.2 oz (64.3 kg)  06/16/23 150 lb (68 kg)  06/13/23 148 lb (67.1 kg)  06/12/23 145 lb (65.8 kg)     He  reports that he has never smoked. He has never used smokeless tobacco. He reports that he does not drink alcohol and does not use drugs.  RELEVANT GI HISTORY, IMAGING AND LABS: Results   DIAGNOSTIC Virtual colonoscopy: No polyps, no  masses  PATHOLOGY Fluid analysis: Negative      CBC    Component Value Date/Time   WBC 4.6 10/20/2023 1325   RBC 5.12 10/20/2023 1325   HGB 12.9 (L) 10/20/2023 1325   HGB 13.8 05/29/2023 1142   HCT 41.7 10/20/2023 1325   HCT 42.4 05/29/2023 1142   PLT 309 10/20/2023 1325   PLT 236 05/29/2023 1142   MCV 81.4 10/20/2023 1325   MCV 90 05/29/2023 1142   MCH 25.2 (L) 10/20/2023 1325   MCHC 30.9 10/20/2023 1325   RDW 15.4 10/20/2023 1325   RDW 14.4 05/29/2023 1142   LYMPHSABS 1.6 09/07/2023 0951   LYMPHSABS 2.3 05/29/2023 1142   MONOABS 0.7 09/07/2023 0951   EOSABS 0.1 09/07/2023 0951   EOSABS 0.2 05/29/2023 1142   BASOSABS 0.0 09/07/2023 0951   BASOSABS 0.1 05/29/2023 1142   Recent Labs    06/30/23 1419 06/30/23 1420 06/30/23 1424 07/02/23 0255 07/03/23 0308 07/04/23 0255 07/05/23 0248 07/06/23 1226 09/07/23 0951 10/20/23 1325  HGB 12.9* 15.6 15.6 14.0 13.5 14.1 14.4 13.9 12.9* 12.9*    CMP     Component Value Date/Time   NA 139 11/27/2023 1033   NA 140 08/29/2023 0000   K 4.4 11/27/2023 1033   CL 101 11/27/2023 1033   CO2 25 11/27/2023 1033   GLUCOSE 212 (H) 11/27/2023 1033   BUN 20 11/27/2023 1033   BUN 22 (A) 08/29/2023 0000   CREATININE 1.54 (H) 11/27/2023 1033   CALCIUM  9.2 11/27/2023 1033   PROT 7.6 11/27/2023 1033   ALBUMIN  3.7 11/27/2023 1033   AST 25 11/27/2023 1033   ALT 25 11/27/2023 1033   ALKPHOS 98 11/27/2023 1033   BILITOT 1.1 11/27/2023 1033   GFRNONAA 49 (L) 11/27/2023 1033   GFRAA >90 09/25/2012 1823      Latest Ref Rng & Units 11/27/2023   10:33 AM 10/20/2023    1:25 PM 09/07/2023    9:51 AM  Hepatic Function  Total Protein 6.5 - 8.1 g/dL 7.6  8.6  8.0   Albumin  3.5 - 5.0 g/dL 3.7  4.1  4.2   AST 15 - 41 U/L 25  29  25    ALT 0 - 44 U/L 25  26  20    Alk Phosphatase 38 - 126 U/L 98  106  116   Total Bilirubin 0.0 - 1.2 mg/dL 1.1  1.1  0.7   Bilirubin, Direct 0.0 - 0.3 mg/dL   0.2       Current Medications:   Current  Outpatient Medications (Endocrine & Metabolic):    empagliflozin  (JARDIANCE ) 10 MG TABS tablet, Take 1 tablet (10 mg total) by mouth daily before breakfast.  JARDIANCE  25 MG TABS tablet, TAKE ONE TABLET BY MOUTH DAILY   levothyroxine  (SYNTHROID ) 112 MCG tablet, Take 1 tablet (112 mcg total) by mouth daily.  Current Outpatient Medications (Cardiovascular):    amiodarone  (PACERONE ) 200 MG tablet, Take 1/2 tablet (100 mg total) by mouth daily.   digoxin  (LANOXIN ) 0.125 MG tablet, Take 1 tablet (0.125 mg total) by mouth daily.   ezetimibe  (ZETIA ) 10 MG tablet, Take 1 tablet (10 mg total) by mouth daily.   midodrine  (PROAMATINE ) 5 MG tablet, Take 1 tablet (5 mg total) by mouth 3 (three) times daily with meals.   rosuvastatin  (CRESTOR ) 40 MG tablet, Take 40 mg by mouth daily.   tadalafil  (CIALIS ) 20 MG tablet, Take 1 tablet (20 mg total) by mouth daily.   torsemide  (DEMADEX ) 20 MG tablet, Take 3 tablets (60 mg total) by mouth daily.   Vericiguat  (VERQUVO ) 2.5 MG TABS, Take 2 tablets (5 mg total) by mouth daily.   Current Outpatient Medications (Analgesics):    acetaminophen  (TYLENOL ) 325 MG tablet, Take 1-2 tablets (325-650 mg total) by mouth every 4 (four) hours as needed for mild pain (pain score 1-3).  Current Outpatient Medications (Hematological):    apixaban  (ELIQUIS ) 5 MG TABS tablet, Take 1 tablet (5 mg total) by mouth 2 (two) times daily.   clopidogrel  (PLAVIX ) 75 MG tablet, Take 1 tablet (75 mg total) by mouth daily.  Current Outpatient Medications (Other):    hyoscyamine (LEVSIN) 0.125 MG tablet, Take 1 tablet (0.125 mg total) by mouth every 6 (six) hours as needed for cramping.   linaclotide  (LINZESS ) 145 MCG CAPS capsule, Take 1 capsule (145 mcg total) by mouth daily before breakfast. Lot: [id number], exp: 03-2024   linaclotide  (LINZESS ) 72 MCG capsule, Take 1 capsule (72 mcg total) by mouth daily before breakfast.   melatonin 5 MG TABS, Take 5 mg by mouth at bedtime as needed  (sleep).   pantoprazole  (PROTONIX ) 40 MG tablet, Take 1 tablet (40 mg total) by mouth 2 (two) times daily before a meal.   potassium chloride  SA (KLOR-CON  M) 20 MEQ tablet, Take 3 tablets (60 mEq total) by mouth daily.   Vitamin D-Vitamin K (VITAMIN K2-VITAMIN D3 PO), Take 1 tablet by mouth daily with lunch.  Medical History:  Past Medical History:  Diagnosis Date   Acute ST elevation myocardial infarction (STEMI) of anterior wall (HCC) 12/10/2022   Acute ST elevation myocardial infarction (STEMI) of inferior wall (HCC) 10/30/2021   Atrial fibrillation (HCC)    Atrial fibrillation with RVR (HCC) 12/19/2022   CHF (congestive heart failure) (HCC)    Coronary artery disease    Diabetes mellitus without complication (HCC)    DM (diabetes mellitus) (HCC)    H. pylori infection    Heart attack (HCC)    HLD (hyperlipidemia)    Hypertension    ST elevation myocardial infarction involving left anterior descending (LAD) coronary artery (HCC) 12/10/2022   Allergies:  Allergies  Allergen Reactions   Bovine (Beef) Protein-Containing Drug Products    Porcine (Pork) Protein-Containing Drug Products Other (See Comments)    Religious observance     Surgical History:  He  has a past surgical history that includes Coronary/Graft Acute MI Revascularization (N/A, 10/30/2021); LEFT HEART CATH AND CORONARY ANGIOGRAPHY (N/A, 10/30/2021); Coronary/Graft Acute MI Revascularization (N/A, 12/10/2022); LEFT HEART CATH AND CORONARY ANGIOGRAPHY (N/A, 12/10/2022); CARDIOVERSION (N/A, 12/14/2022); RIGHT HEART CATH (N/A, 03/20/2023); Esophagogastroduodenoscopy (N/A, 03/23/2023); ICD IMPLANT (N/A, 06/01/2023); RIGHT HEART CATH (N/A, 06/30/2023); and IR THORACENTESIS ASP PLEURAL SPACE  W/IMG GUIDE (11/28/2023). Family History:  His family history includes Chronic Renal Failure in his brother and father; Diabetes in his brother, brother, father, and mother; Heart attack in his brother; Stroke in his brother.  REVIEW OF  SYSTEMS  : All other systems reviewed and negative except where noted in the History of Present Illness.  PHYSICAL EXAM: BP (!) 80/54   Pulse 84   Ht 6' (1.829 m)   Wt 144 lb 6 oz (65.5 kg)   BMI 19.58 kg/m  Physical Exam   GENERAL APPEARANCE: thin appearing,  in no apparent distress. HEENT: No cervical lymphadenopathy, unremarkable thyroid , sclerae anicteric, conjunctiva pink. RESPIRATORY: Respiratory effort normal, breath sounds clear to auscultation bilaterally without rales, rhonchi, or wheezing. CARDIO: Regular rhythm with occasional extra beats, no murmurs, rubs, or gallops, peripheral pulses intact. AICD left upper chest.  ABDOMEN: Soft, non-distended, active bowel sounds in all four quadrants, non tender, no rebound, no mass appreciated. RECTAL: Declines. MUSCULOSKELETAL: Full range of motion, normal gait, 2+ edema. SKIN: Dry, intact without rashes or lesions. No jaundice. NEURO: Alert, oriented, no focal deficits. PSYCH: Cooperative, normal mood and affect.      Alan JONELLE Coombs, PA-C 12:05 PM

## 2023-12-08 ENCOUNTER — Other Ambulatory Visit (HOSPITAL_COMMUNITY): Payer: Self-pay

## 2023-12-11 ENCOUNTER — Encounter: Payer: Self-pay | Admitting: Physician Assistant

## 2023-12-11 ENCOUNTER — Ambulatory Visit: Admitting: Physician Assistant

## 2023-12-11 ENCOUNTER — Ambulatory Visit: Payer: Self-pay | Admitting: Physician Assistant

## 2023-12-11 ENCOUNTER — Other Ambulatory Visit

## 2023-12-11 ENCOUNTER — Other Ambulatory Visit (HOSPITAL_COMMUNITY): Payer: Self-pay

## 2023-12-11 VITALS — BP 80/54 | HR 84 | Ht 72.0 in | Wt 144.4 lb

## 2023-12-11 DIAGNOSIS — A048 Other specified bacterial intestinal infections: Secondary | ICD-10-CM

## 2023-12-11 DIAGNOSIS — D509 Iron deficiency anemia, unspecified: Secondary | ICD-10-CM | POA: Diagnosis not present

## 2023-12-11 DIAGNOSIS — I5022 Chronic systolic (congestive) heart failure: Secondary | ICD-10-CM

## 2023-12-11 DIAGNOSIS — E538 Deficiency of other specified B group vitamins: Secondary | ICD-10-CM | POA: Diagnosis not present

## 2023-12-11 DIAGNOSIS — K5900 Constipation, unspecified: Secondary | ICD-10-CM | POA: Diagnosis not present

## 2023-12-11 DIAGNOSIS — R634 Abnormal weight loss: Secondary | ICD-10-CM

## 2023-12-11 DIAGNOSIS — R1032 Left lower quadrant pain: Secondary | ICD-10-CM

## 2023-12-11 DIAGNOSIS — I4891 Unspecified atrial fibrillation: Secondary | ICD-10-CM

## 2023-12-11 LAB — CBC WITH DIFFERENTIAL/PLATELET
Basophils Absolute: 0 K/uL (ref 0.0–0.1)
Basophils Relative: 1.1 % (ref 0.0–3.0)
Eosinophils Absolute: 0.1 K/uL (ref 0.0–0.7)
Eosinophils Relative: 2 % (ref 0.0–5.0)
HCT: 44.8 % (ref 39.0–52.0)
Hemoglobin: 14.3 g/dL (ref 13.0–17.0)
Lymphocytes Relative: 45.8 % (ref 12.0–46.0)
Lymphs Abs: 1.8 K/uL (ref 0.7–4.0)
MCHC: 31.9 g/dL (ref 30.0–36.0)
MCV: 84.8 fl (ref 78.0–100.0)
Monocytes Absolute: 0.5 K/uL (ref 0.1–1.0)
Monocytes Relative: 13.5 % — ABNORMAL HIGH (ref 3.0–12.0)
Neutro Abs: 1.5 K/uL (ref 1.4–7.7)
Neutrophils Relative %: 37.6 % — ABNORMAL LOW (ref 43.0–77.0)
Platelets: 179 K/uL (ref 150.0–400.0)
RBC: 5.29 Mil/uL (ref 4.22–5.81)
RDW: 25.9 % — ABNORMAL HIGH (ref 11.5–15.5)
WBC: 4 K/uL (ref 4.0–10.5)

## 2023-12-11 LAB — IBC + FERRITIN
Ferritin: 63.6 ng/mL (ref 22.0–322.0)
Iron: 42 ug/dL (ref 42–165)
Saturation Ratios: 12.5 % — ABNORMAL LOW (ref 20.0–50.0)
TIBC: 336 ug/dL (ref 250.0–450.0)
Transferrin: 240 mg/dL (ref 212.0–360.0)

## 2023-12-11 MED ORDER — HYOSCYAMINE SULFATE 0.125 MG PO TABS
0.1250 mg | ORAL_TABLET | Freq: Four times a day (QID) | ORAL | 3 refills | Status: AC | PRN
  Filled 2023-12-11: qty 30, 8d supply, fill #0
  Filled 2023-12-14: qty 30, 8d supply, fill #1
  Filled 2024-02-16: qty 30, 8d supply, fill #2

## 2023-12-11 MED ORDER — LINACLOTIDE 145 MCG PO CAPS: 145.0000 ug | ORAL_CAPSULE | Freq: Every day | ORAL | 0 refills | Status: AC

## 2023-12-11 NOTE — Patient Instructions (Addendum)
 Your provider has requested that you go to the basement level for lab work before leaving today. Press B on the elevator. The lab is located at the first door on the left as you exit the elevator.  We have sent the following medications to your pharmacy for you to pick up at your convenience: Levsin: Take 1 every 6 hours as needed   VISIT SUMMARY:  You visited us  today due to abdominal pain and constipation. We discussed your ongoing issues with anemia and the need for further investigation if it persists.  YOUR PLAN:  IRON DEFICIENCY ANEMIA: You have persistent anemia despite receiving IV iron infusions. Your recent virtual colonoscopy did not show any polyps or masses. -We will repeat your lab tests in 2-3 months to monitor your anemia. -If your anemia continues, we may consider a capsule endoscopy to investigate further.  CONSTIPATION WITH POST-DEFECATION ABDOMINAL PAIN: You are experiencing chronic constipation and severe pain after bowel movements. Your current medication, Linzess  72 mcg, is not providing adequate relief. -We provided you with samples of Linzess  145 mcg to take daily 30 minutes before a meal -If the 145 mcg dose is effective, we will send a prescription for it. -Stop taking the Linzess  72 mcg if you start using the 145 mcg dose. -We prescribed hyoscyamine to use as needed for bowel spasms.  Please follow up with Dr. Federico in March 2026.  Thank you for entrusting me with your care and for choosing [facility], Alan Coombs, P.A.-C   _______________________________________________________  If your blood pressure at your visit was 140/90 or greater, please contact your primary care physician to follow up on this.  _______________________________________________________  If you are age 4 or older, your body mass index should be between 23-30. Your Body mass index is 19.58 kg/m. If this is out of the aforementioned range listed, please consider  follow up with your Primary Care Provider.  If you are age 63 or younger, your body mass index should be between 19-25. Your Body mass index is 19.58 kg/m. If this is out of the aformentioned range listed, please consider follow up with your Primary Care Provider.   ________________________________________________________  The [facility] providers would like to encourage you to use MYCHART to communicate with providers for non-urgent requests or questions.  Due to long hold times on the telephone, sending your provider a message by [facility] may be a faster and more efficient way to get a response.  Please allow 48 business hours for a response.  Please remember that this is for non-urgent requests.  _______________________________________________________  [facility] is using a team-based approach to care.  Your team is made up of your doctor and two to three APPS. Our APPS (Nurse Practitioners and Physician Assistants) work with your physician to ensure care continuity for you. They are fully qualified to address your health concerns and develop a treatment plan. They communicate directly with your gastroenterologist to care for you. Seeing the Advanced Practice Practitioners on your physician's team can help you by facilitating care more promptly, often allowing for earlier appointments, access to diagnostic testing, procedures, and other specialty referrals.

## 2023-12-12 ENCOUNTER — Encounter: Payer: Self-pay | Admitting: General Practice

## 2023-12-12 ENCOUNTER — Ambulatory Visit: Admitting: General Practice

## 2023-12-12 ENCOUNTER — Ambulatory Visit: Payer: Self-pay | Admitting: General Practice

## 2023-12-12 ENCOUNTER — Other Ambulatory Visit (HOSPITAL_COMMUNITY): Payer: Self-pay

## 2023-12-12 VITALS — BP 100/64 | HR 80 | Temp 97.8°F | Ht 72.0 in | Wt 144.0 lb

## 2023-12-12 DIAGNOSIS — E7800 Pure hypercholesterolemia, unspecified: Secondary | ICD-10-CM

## 2023-12-12 DIAGNOSIS — E118 Type 2 diabetes mellitus with unspecified complications: Secondary | ICD-10-CM | POA: Diagnosis not present

## 2023-12-12 DIAGNOSIS — Z7984 Long term (current) use of oral hypoglycemic drugs: Secondary | ICD-10-CM | POA: Diagnosis not present

## 2023-12-12 LAB — POCT GLYCOSYLATED HEMOGLOBIN (HGB A1C): Hemoglobin A1C: 6.4 % — AB (ref 4.0–5.6)

## 2023-12-12 LAB — MICROALBUMIN / CREATININE URINE RATIO
Creatinine,U: 172.1 mg/dL
Microalb Creat Ratio: 45.1 mg/g — ABNORMAL HIGH (ref 0.0–30.0)
Microalb, Ur: 7.8 mg/dL — ABNORMAL HIGH (ref 0.0–1.9)

## 2023-12-12 NOTE — Progress Notes (Signed)
 Established Patient Office Visit  Subjective   Patient ID: Garrett Roberts, male    DOB: 05-02-1955  Age: 68 y.o. MRN: [id number]  Chief Complaint  Patient presents with   Diabetes    And cholesterol follow up    Diabetes Pertinent negatives for hypoglycemia include no dizziness, headaches or nervousness/anxiousness. Pertinent negatives for diabetes include no chest pain and no polydipsia.    Discussed the use of AI scribe software for clinical note transcription with the patient, who gave verbal consent to proceed.  History of Present Illness Diabetes: He is currently taking Jardiance , which was increased to 25 mg. His A1c was previously 7.0, showing improvement from prior levels.  He does not check his blood sugars at home.  Diabetic eye exam and foot exam up-to-date.  He is on Linzess  for gastrointestinal issues, which was increased from 70 to 140 mcg. He uses it as needed without side effects. He has hyoscyamine available for pain but has not yet used it.  He continues to take Crestor  and Zetia  for cholesterol management, with recent tests showing improved levels.   His thyroid  function has improved significantly, with levothyroxine  being taken daily as the first medication in the morning.  No shortness of breath, chest pain, or significant swelling in the ankles. Occasional cough but no recent episodes of significant respiratory distress.     Patient Active Problem List   Diagnosis Date Noted   Anemia, unspecified 10/23/2023   Iron deficiency anemia 09/12/2023   Chronic abdominal pain 09/01/2023   Elevated serum GGT level 09/01/2023   Epigastric pain 07/12/2023   HFrEF (heart failure with reduced ejection fraction) (HCC) 06/30/2023   Recurrent right pleural effusion 06/30/2023   Chronic hypotension 06/30/2023   Renal insufficiency 06/30/2023   Hypothyroidism 06/30/2023   GERD (gastroesophageal reflux disease) 06/30/2023   Presence of heart assist device (HCC)  06/12/2023   Chronic systolic heart failure (HCC) 06/01/2023   Poor appetite 04/19/2023   Hospital discharge follow-up 04/05/2023   Subclinical hypothyroidism 04/05/2023   Loss of weight 03/23/2023   Protein-calorie malnutrition, severe 03/22/2023   Acute on chronic systolic CHF (congestive heart failure) (HCC) 02/17/2023   Ischemic cardiomyopathy 12/19/2022   Chronic HFrEF (heart failure with reduced ejection fraction) (HCC) 12/19/2022   CKD (chronic kidney disease), 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   Type 2 diabetes mellitus with complication, without long-term current use of insulin  (HCC) 12/15/2022   Atrial fibrillation (HCC) 12/14/2022   Constipation 12/12/2022   Left sided abdominal pain 12/11/2022   Abnormal finding on GI tract imaging 12/11/2022   CAD (coronary artery disease) 10/11/2022   Essential hypertension 10/11/2022   Hyperlipidemia 10/11/2022   Vitamin D deficiency 08/03/2015   Past Medical History:  Diagnosis Date   Acute ST elevation myocardial infarction (STEMI) of anterior wall (HCC) 12/10/2022   Acute ST elevation myocardial infarction (STEMI) of inferior wall (HCC) 10/30/2021   Atrial fibrillation (HCC)    Atrial fibrillation with RVR (HCC) 12/19/2022   CHF (congestive heart failure) (HCC)    Coronary artery disease    Diabetes mellitus without complication (HCC)    DM (diabetes mellitus) (HCC)    H. pylori infection    Heart attack (HCC)    HLD (hyperlipidemia)    Hypertension    ST elevation myocardial infarction involving left anterior descending (LAD) coronary artery (HCC) 12/10/2022   Past Surgical History:  Procedure Laterality Date   CARDIOVERSION N/A 12/14/2022   Procedure: CARDIOVERSION;  Surgeon: Kate Lonni CROME, MD;  [facility]  INVASIVE CV LAB;  Service: Cardiovascular;  Laterality: N/A;   CORONARY/GRAFT ACUTE MI REVASCULARIZATION N/A 10/30/2021   Procedure: Coronary/Graft Acute MI Revascularization;  Surgeon: Ladona Heinz, MD;  Location: [facility]  INVASIVE CV LAB;  Service: Cardiovascular;  Laterality: N/A;   CORONARY/GRAFT ACUTE MI REVASCULARIZATION N/A 12/10/2022   Procedure: Coronary/Graft Acute MI Revascularization;  Surgeon: Wonda Sharper, MD;  Location: [facility] INVASIVE CV LAB;  Service: Cardiovascular;  Laterality: N/A;   ESOPHAGOGASTRODUODENOSCOPY N/A 03/23/2023   Procedure: EGD (ESOPHAGOGASTRODUODENOSCOPY);  Surgeon: Stacia Glendia BRAVO, MD;  Location: [facility] ENDOSCOPY;  Service: Gastroenterology;  Laterality: N/A;   ICD IMPLANT N/A 06/01/2023   Procedure: ICD IMPLANT;  Surgeon: Kennyth Chew, MD;  Location: [facility] INVASIVE CV LAB;  Service: Cardiovascular;  Laterality: N/A;   IR THORACENTESIS ASP PLEURAL SPACE W/IMG GUIDE  11/28/2023   LEFT HEART CATH AND CORONARY ANGIOGRAPHY N/A 10/30/2021   Procedure: LEFT HEART CATH AND CORONARY ANGIOGRAPHY;  Surgeon: Ladona Heinz, MD;  Location: [facility] INVASIVE CV LAB;  Service: Cardiovascular;  Laterality: N/A;   LEFT HEART CATH AND CORONARY ANGIOGRAPHY N/A 12/10/2022   Procedure: LEFT HEART CATH AND CORONARY ANGIOGRAPHY;  Surgeon: Wonda Sharper, MD;  Location: [facility] INVASIVE CV LAB;  Service: Cardiovascular;  Laterality: N/A;   RIGHT HEART CATH N/A 03/20/2023   Procedure: RIGHT HEART CATH;  Surgeon: Rolan Ezra RAMAN, MD;  Location: [facility] INVASIVE CV LAB;  Service: Cardiovascular;  Laterality: N/A;   RIGHT HEART CATH N/A 06/30/2023   Procedure: RIGHT HEART CATH;  Surgeon: Cherrie Toribio SAUNDERS, MD;  Location: [facility] INVASIVE CV LAB;  Service: Cardiovascular;  Laterality: N/A;   Allergies  Allergen Reactions   Bovine (Beef) Protein-Containing Drug Products    Porcine (Pork) Protein-Containing Drug Products Other (See Comments)    Religious observance         09/01/2023    3:06 PM 07/12/2023   12:17 PM 07/12/2023   11:37 AM  Depression screen PHQ 2/9  Decreased Interest 0 0 0  Down, Depressed, Hopeless 0 0 0  PHQ - 2 Score 0 0 0  Altered sleeping 0    Tired, decreased energy 0    Change in appetite 0    Feeling  bad or failure about yourself  0    Trouble concentrating 0    Moving slowly or fidgety/restless 0    Suicidal thoughts 0    PHQ-9 Score 0     Difficult doing work/chores Not difficult at all  Not difficult at all     Data saved with a previous flowsheet row definition       06/12/2023   11:09 AM 04/05/2023    2:33 PM  GAD 7 : Generalized Anxiety Score  Nervous, Anxious, on Edge 0 0  Control/stop worrying 0 0  Worry too much - different things 0 0  Trouble relaxing 0 0  Restless 0 0  Easily annoyed or irritable 0 0  Afraid - awful might happen 0 0  Total GAD 7 Score 0 0  Anxiety Difficulty Not difficult at all Not difficult at all      Review of Systems  Constitutional:  Negative for chills and fever.  Respiratory:  Negative for shortness of breath.   Cardiovascular:  Negative for chest pain.  Gastrointestinal:  Negative for abdominal pain, constipation, diarrhea, heartburn, nausea and vomiting.  Genitourinary:  Negative for dysuria, frequency and urgency.  Neurological:  Negative for dizziness and headaches.  Endo/Heme/Allergies:  Negative for polydipsia.  Psychiatric/Behavioral:  Negative for depression and suicidal ideas.  The patient is not nervous/anxious.       Objective:     BP 100/64   Pulse 80   Temp 97.8 F (36.6 C) (Oral)   Ht 6' (1.829 m)   Wt 144 lb (65.3 kg)   SpO2 100%   BMI 19.53 kg/m  BP Readings from Last 3 Encounters:  12/12/23 100/64  12/11/23 (!) 80/54  11/28/23 90/64   Wt Readings from Last 3 Encounters:  12/12/23 144 lb (65.3 kg)  12/11/23 144 lb 6 oz (65.5 kg)  11/27/23 141 lb 12.8 oz (64.3 kg)      Physical Exam Vitals and nursing note reviewed.  Constitutional:      Appearance: Normal appearance.  Cardiovascular:     Rate and Rhythm: Normal rate and regular rhythm.     Pulses: Normal pulses.     Heart sounds: Normal heart sounds.  Pulmonary:     Effort: Pulmonary effort is normal.     Breath sounds: Normal breath sounds.   Neurological:     Mental Status: He is alert and oriented to person, place, and time.  Psychiatric:        Mood and Affect: Mood normal.        Behavior: Behavior normal.        Thought Content: Thought content normal.        Judgment: Judgment normal.      No results found for any visits on 12/12/23.     The ASCVD Risk score (Arnett DK, et al., 2019) failed to calculate for the following reasons:   Risk score cannot be calculated because patient has a medical history suggesting prior/existing ASCVD    Assessment & Plan:  Type 2 diabetes mellitus with complication, without long-term current use of insulin  (HCC) -     Microalbumin / creatinine urine ratio -     POCT glycosylated hemoglobin (Hb A1C)  Pure hypercholesterolemia   Assessment and Plan Assessment & Plan Type 2 diabetes mellitus with unspecified complications A1c improved to 7.0. Jardiance  increased to 25 mg by cardiologist, aiding diabetes and heart health. - A1c today 6.4. -Commended patient on the progress. -Urine ACR pending. - Continue Jardiance  25 mg daily.  Hypothyroidism Thyroid  function improved with levothyroxine , TSH decreased to 5.7. - Continue levothyroxine  5 mcg daily. - Following with endocrinology.  Reviewed notes.  Pure hypercholesterolemia Cholesterol levels well-controlled with current medication regimen. -Follow with cardiology.  Reviewed notes. - Continue Crestor  and Zetia  as prescribed.  Systolic heart failure with reduced ejection fraction Symptoms improved with current medication regimen. No shortness of breath or significant edema. -Reviewed cardiology notes. - Continue current heart failure medications. - Monitor for signs of fluid overload and contact cardiology if these occur.  Chronic hypotension Blood pressure remains low but well-managed with current medication regimen. - Continue Midodrine  three times a day.  Iron deficiency anemia Iron levels improved, indicating  better management of anemia.  Chronic abdominal pain Linzess  increased to 140 mcg by GI specialist, effectively managing symptoms. Hyoscyamine prescribed for pain management. - Continue Linzess  as needed. - Use hyoscyamine for pain management if necessary.    Return in about 3 months (around 03/11/2024) for chronic care management.SABRA Carrol Aurora, NP

## 2023-12-12 NOTE — Patient Instructions (Signed)
 Continue medication as is.   Follow up in 3 months.   It was a pleasure to see you today!

## 2023-12-13 ENCOUNTER — Encounter (HOSPITAL_COMMUNITY)

## 2023-12-14 ENCOUNTER — Other Ambulatory Visit (HOSPITAL_COMMUNITY): Payer: Self-pay

## 2023-12-14 ENCOUNTER — Other Ambulatory Visit: Payer: Self-pay

## 2023-12-20 ENCOUNTER — Other Ambulatory Visit (HOSPITAL_COMMUNITY): Payer: Self-pay

## 2023-12-29 ENCOUNTER — Telehealth (HOSPITAL_COMMUNITY): Payer: Self-pay

## 2023-12-29 ENCOUNTER — Other Ambulatory Visit (HOSPITAL_COMMUNITY): Payer: Self-pay

## 2023-12-29 ENCOUNTER — Other Ambulatory Visit: Payer: Self-pay

## 2024-01-02 ENCOUNTER — Other Ambulatory Visit (HOSPITAL_COMMUNITY): Payer: Self-pay

## 2024-01-02 ENCOUNTER — Encounter (HOSPITAL_COMMUNITY): Payer: Self-pay

## 2024-01-02 MED ORDER — VERQUVO 5 MG PO TABS
5.0000 mg | ORAL_TABLET | Freq: Every day | ORAL | 3 refills | Status: DC
  Filled 2024-01-02: qty 30, 30d supply, fill #0
  Filled ????-??-??: fill #1

## 2024-01-02 NOTE — Telephone Encounter (Signed)
 Advanced Heart Failure Patient Advocate Encounter  Prior authorization for Verquvo  5 mg has been submitted and approved. Test billing returns $4 for 90 day supply.  [place] Tracks Conf: [id number] F Effective: 01/02/2024 to 12/27/2024  Rachel DEL, CPhT Rx Patient Advocate Phone: [phone]

## 2024-01-03 ENCOUNTER — Other Ambulatory Visit (HOSPITAL_COMMUNITY): Payer: Self-pay

## 2024-01-13 ENCOUNTER — Other Ambulatory Visit (HOSPITAL_BASED_OUTPATIENT_CLINIC_OR_DEPARTMENT_OTHER): Payer: Self-pay

## 2024-01-13 LAB — ACID FAST CULTURE WITH REFLEXED SENSITIVITIES (MYCOBACTERIA): Acid Fast Culture: NEGATIVE

## 2024-01-17 ENCOUNTER — Other Ambulatory Visit (HOSPITAL_COMMUNITY): Payer: Self-pay

## 2024-01-17 ENCOUNTER — Encounter (HOSPITAL_COMMUNITY): Payer: Self-pay

## 2024-01-17 ENCOUNTER — Ambulatory Visit (HOSPITAL_COMMUNITY)
Admission: RE | Admit: 2024-01-17 | Discharge: 2024-01-17 | Disposition: A | Discharge status: Home / Self Care | Source: Ambulatory Visit | Attending: Cardiology | Admitting: Cardiology

## 2024-01-17 ENCOUNTER — Other Ambulatory Visit: Payer: Self-pay

## 2024-01-17 ENCOUNTER — Ambulatory Visit

## 2024-01-17 ENCOUNTER — Ambulatory Visit (HOSPITAL_COMMUNITY): Payer: Self-pay | Admitting: Cardiology

## 2024-01-17 VITALS — BP 89/66 | HR 78 | Ht 72.0 in | Wt 157.6 lb

## 2024-01-17 DIAGNOSIS — J948 Other specified pleural conditions: Secondary | ICD-10-CM | POA: Diagnosis not present

## 2024-01-17 DIAGNOSIS — I4510 Unspecified right bundle-branch block: Secondary | ICD-10-CM | POA: Insufficient documentation

## 2024-01-17 DIAGNOSIS — Z7901 Long term (current) use of anticoagulants: Secondary | ICD-10-CM | POA: Diagnosis not present

## 2024-01-17 DIAGNOSIS — I255 Ischemic cardiomyopathy: Secondary | ICD-10-CM | POA: Diagnosis present

## 2024-01-17 DIAGNOSIS — Z79899 Other long term (current) drug therapy: Secondary | ICD-10-CM | POA: Diagnosis not present

## 2024-01-17 DIAGNOSIS — I959 Hypotension, unspecified: Secondary | ICD-10-CM | POA: Insufficient documentation

## 2024-01-17 DIAGNOSIS — I2721 Secondary pulmonary arterial hypertension: Secondary | ICD-10-CM | POA: Insufficient documentation

## 2024-01-17 DIAGNOSIS — Z9889 Other specified postprocedural states: Secondary | ICD-10-CM | POA: Insufficient documentation

## 2024-01-17 DIAGNOSIS — I5023 Acute on chronic systolic (congestive) heart failure: Secondary | ICD-10-CM

## 2024-01-17 DIAGNOSIS — Z7902 Long term (current) use of antithrombotics/antiplatelets: Secondary | ICD-10-CM | POA: Diagnosis not present

## 2024-01-17 DIAGNOSIS — I110 Hypertensive heart disease with heart failure: Secondary | ICD-10-CM | POA: Diagnosis not present

## 2024-01-17 DIAGNOSIS — I2510 Atherosclerotic heart disease of native coronary artery without angina pectoris: Secondary | ICD-10-CM | POA: Diagnosis not present

## 2024-01-17 DIAGNOSIS — E8770 Fluid overload, unspecified: Secondary | ICD-10-CM | POA: Diagnosis not present

## 2024-01-17 DIAGNOSIS — R6881 Early satiety: Secondary | ICD-10-CM | POA: Insufficient documentation

## 2024-01-17 DIAGNOSIS — R5383 Other fatigue: Secondary | ICD-10-CM | POA: Diagnosis not present

## 2024-01-17 DIAGNOSIS — E039 Hypothyroidism, unspecified: Secondary | ICD-10-CM | POA: Diagnosis not present

## 2024-01-17 DIAGNOSIS — Z79890 Hormone replacement therapy (postmenopausal): Secondary | ICD-10-CM | POA: Insufficient documentation

## 2024-01-17 DIAGNOSIS — Z955 Presence of coronary angioplasty implant and graft: Secondary | ICD-10-CM | POA: Insufficient documentation

## 2024-01-17 DIAGNOSIS — D509 Iron deficiency anemia, unspecified: Secondary | ICD-10-CM | POA: Insufficient documentation

## 2024-01-17 DIAGNOSIS — I272 Pulmonary hypertension, unspecified: Secondary | ICD-10-CM | POA: Insufficient documentation

## 2024-01-17 DIAGNOSIS — I5022 Chronic systolic (congestive) heart failure: Secondary | ICD-10-CM | POA: Insufficient documentation

## 2024-01-17 DIAGNOSIS — J90 Pleural effusion, not elsewhere classified: Secondary | ICD-10-CM | POA: Insufficient documentation

## 2024-01-17 DIAGNOSIS — E785 Hyperlipidemia, unspecified: Secondary | ICD-10-CM | POA: Insufficient documentation

## 2024-01-17 DIAGNOSIS — E119 Type 2 diabetes mellitus without complications: Secondary | ICD-10-CM | POA: Insufficient documentation

## 2024-01-17 DIAGNOSIS — I480 Paroxysmal atrial fibrillation: Secondary | ICD-10-CM | POA: Diagnosis not present

## 2024-01-17 DIAGNOSIS — D539 Nutritional anemia, unspecified: Secondary | ICD-10-CM | POA: Insufficient documentation

## 2024-01-17 DIAGNOSIS — J479 Bronchiectasis, uncomplicated: Secondary | ICD-10-CM | POA: Insufficient documentation

## 2024-01-17 DIAGNOSIS — I252 Old myocardial infarction: Secondary | ICD-10-CM | POA: Diagnosis not present

## 2024-01-17 DIAGNOSIS — Z95810 Presence of automatic (implantable) cardiac defibrillator: Secondary | ICD-10-CM | POA: Insufficient documentation

## 2024-01-17 DIAGNOSIS — Z7984 Long term (current) use of oral hypoglycemic drugs: Secondary | ICD-10-CM | POA: Diagnosis not present

## 2024-01-17 LAB — COMPREHENSIVE METABOLIC PANEL WITH GFR
ALT: 22 U/L (ref 0–44)
AST: 31 U/L (ref 15–41)
Albumin: 4.1 g/dL (ref 3.5–5.0)
Alkaline Phosphatase: 156 U/L — ABNORMAL HIGH (ref 38–126)
Anion gap: 10 (ref 5–15)
BUN: 33 mg/dL — ABNORMAL HIGH (ref 8–23)
CO2: 28 mmol/L (ref 22–32)
Calcium: 9.3 mg/dL (ref 8.9–10.3)
Chloride: 99 mmol/L (ref 98–111)
Creatinine, Ser: 1.39 mg/dL — ABNORMAL HIGH (ref 0.61–1.24)
GFR, Estimated: 55 mL/min — ABNORMAL LOW
Glucose, Bld: 142 mg/dL — ABNORMAL HIGH (ref 70–99)
Potassium: 4 mmol/L (ref 3.5–5.1)
Sodium: 137 mmol/L (ref 135–145)
Total Bilirubin: 0.6 mg/dL (ref 0.0–1.2)
Total Protein: 7.8 g/dL (ref 6.5–8.1)

## 2024-01-17 LAB — CBC
HCT: 39.3 % (ref 39.0–52.0)
Hemoglobin: 12.8 g/dL — ABNORMAL LOW (ref 13.0–17.0)
MCH: 27.8 pg (ref 26.0–34.0)
MCHC: 32.6 g/dL (ref 30.0–36.0)
MCV: 85.4 fL (ref 80.0–100.0)
Platelets: 181 K/uL (ref 150–400)
RBC: 4.6 MIL/uL (ref 4.22–5.81)
RDW: 19.9 % — ABNORMAL HIGH (ref 11.5–15.5)
WBC: 4.5 K/uL (ref 4.0–10.5)
nRBC: 0 % (ref 0.0–0.2)

## 2024-01-17 LAB — TSH: TSH: 4.21 u[IU]/mL (ref 0.350–4.500)

## 2024-01-17 MED ORDER — POTASSIUM CHLORIDE CRYS ER 20 MEQ PO TBCR: 60.0000 meq | EXTENDED_RELEASE_TABLET | Freq: Once | ORAL | Status: DC

## 2024-01-17 MED ORDER — POTASSIUM CHLORIDE CRYS ER 20 MEQ PO TBCR
40.0000 meq | EXTENDED_RELEASE_TABLET | Freq: Two times a day (BID) | ORAL | 6 refills | Status: DC
  Filled ????-??-??: fill #0

## 2024-01-17 MED ORDER — TORSEMIDE 20 MG PO TABS
60.0000 mg | ORAL_TABLET | Freq: Two times a day (BID) | ORAL | 6 refills | Status: DC
  Filled ????-??-??: fill #0

## 2024-01-17 MED ORDER — FUROSEMIDE 10 MG/ML IJ SOLN: 80.0000 mg | Freq: Once | INTRAMUSCULAR | Status: DC

## 2024-01-17 MED ORDER — FUROSCIX 80 MG/10ML ~~LOC~~ CTKT
80.0000 mg | CARTRIDGE | Freq: Every day | SUBCUTANEOUS | 0 refills | Status: DC
  Filled 2024-01-17: qty 10, 10d supply, fill #0

## 2024-01-17 NOTE — Progress Notes (Signed)
 Patient presents for 6 week follow up in [facility] today alone.  Patient very slowly ambulated into clinic independently. States he feels like he has fluid on board today. Pts wt is up 17lbs today. He notes orthopnea and PND as well swelling in his feet/legs. Denies lightheadedness, dizziness, falls, shortness of breath, and signs of bleeding. Patient states his GI symptoms have seemed to resolved. Pt saw GI on 12/1 and they increased his Linzess  to 145 mcg.  Pt taking all medication as prescribed. Pt increased Verquvo  to 5mg  daily as instructed last visit.  Pt no showed for CPX in December. This was our second attempt at CPX.  Dr Rolan had lengthy conversation with pt about his life expectancy without having some type of advanced therapy. Daughter came into the room and she was informed of the same by VAD coordinator. They were both informed that at pts visit next week we need his daughters and wife present to discuss options moving forward.  Unable to obtain IV today in clinic. Pt sent 4 days of Furoscix  to [facility] pharmacy.  Vital Signs:  HR: 78 BP: 89/66 (75) SPO2: 99%   Weight: 157.6 lb  Last weight: 141.8 lb   Symptom YES NO DETAILS  Angina  x Activity:  Claudication  x How Far:  Syncope  x When:  Stroke  x   Orthopnea x  How many pillows: 3-4  PND x  How often:  CPAP  x How many hours:  Pedal Edema x  Bilat feet/leg  Abdominal Fullness  x   Nausea / Vomit  x Increased appetite  Diaphoresis  x When:  Shortness of Breath x  Activity: lying flat  Palpitations  x When:  ICD shock  x   Bleeding S/S  x   Tea-colored Urine  x   Hospitalizations  x   Emergency Room  x   Other MD     Activity   Fluid   Diet     Device: [employer] Therapies: on  Last check: 10/18/2023  Patient Instructions:  Your provider has order Furoscix  for you. This is an on-body infuser that gives you a dose of Furosemide .   Ensure you write down the time you start your infusion so that if there is  a problem you will know how long the infusion lasted  Use Furoscix  only AS DIRECTED by our office  Dosing Directions: DO NOT TAKE TORSEMIDE  WHILE USING FUROSCIX    INCREASE POTASSIUM TO (2) TABLETS TWICE DAILY   Day 1= TODAY-- USE 1 DOSE OF FUROSCIX    Day 2= TOMORROW-- USE 1 DOSE OF FUROSCIX    Day 3= FRIDAY-- USE 1 DOSE OF FUROSCIX    Day 4= SATURDAY-- USE 1 DOSE OF FUROSCIX    Day 5= SUNDAY-- RESUME TORSEMIDE  60MG  (3) TABLETS TWICE DAILY   Return to clinic in 1 week  Lauraine Ip RN, BSN VAD Coordinator 24/7 Pager [phone]

## 2024-01-17 NOTE — Patient Instructions (Addendum)
" °  Your provider has order Furoscix  for you. This is an on-body infuser that gives you a dose of Furosemide .   Ensure you write down the time you start your infusion so that if there is a problem you will know how long the infusion lasted  Use Furoscix  only AS DIRECTED by our office  Dosing Directions: DO NOT TAKE TORSEMIDE  WHILE USING FUROSCIX    INCREASE POTASSIUM TO (2) TABLETS TWICE DAILY   Day 1= TODAY-- USE 1 DOSE OF FUROSCIX    Day 2= TOMORROW-- USE 1 DOSE OF FUROSCIX    Day 3= FRIDAY-- USE 1 DOSE OF FUROSCIX    Day 4= SATURDAY-- USE 1 DOSE OF FUROSCIX    Day 5= SUNDAY-- RESUME TORSEMIDE  60MG  (3) TABLETS TWICE DAILY  Return to clinic in 1 week  "

## 2024-01-17 NOTE — Progress Notes (Signed)
 Specialty Pharmacy Initial Fill Coordination Note  Garrett Roberts is a 69 y.o. male contacted today regarding initial fill of specialty medication(s) Furosemide  (Furoscix )   Patient requested [name] at [facility] at [place] date: 01/17/24   Medication will be filled on: 01/17/24    Patient is aware of $4 copayment.

## 2024-01-17 NOTE — Progress Notes (Signed)
 "   Advanced Heart Failure Clinic Note  PCP: Vincente Shivers, NP HF Cardiology: Dr. Rolan  Chief complaint: CHF  69 y.o. with history of CAD, DM II, HTN, HLD, ischemic cardiomyopathy/chronic systolic CHF.   Patient had inferior STEMI 10/23 s/p PCI/DES to RCA. Had residual diffuse disease in diagonals and OMs. EF 40-45% at time of cath. Subsequent echo in 10/23 with EF 55-60%.   He was not taking medications in 2024 after losing insurance. Presented with anterior STEMI 12/10/22. Cardiac cath with 100% proximal LAD treated with PCI/DES. Prior RCA stent patent, severe disease in tortuous ramus and distal LCx treated medically.  Echo with EF 25%, no LV thrombus, AK mid septum to apex, moderately reduced RV systolic function. Course complicated by atrial fibrillation with RVR. Started on IV amiodarone  and underwent DCCV to SR.  GDMT limited by soft blood pressure. Also seen by GI for abdominal pain. Had thickening of stomach on CT scan. H. Pylori later resulted positive after discharge, seen by GI and started on treatment for H pylori-related gastritis.   Returned to ED 12/19/22 with recurrent atrial fibrillation. He converted to SR while in waiting room and was discharged home.  Admitted 2/25 with acute CHF. Diuresed with IV lasix . GDMT cut back 2/2 to low BP, Entresto  and spironolactone  stopped. He was discharged home, weight 154 lbs.  Echo in 2/25 showed EF 25% with mild RV dysfunction and mild RV enlargement, PASP 60 mmHg, moderate MR.   He was seen again in the ER 03/14/23 with worsening fatigue and dyspnea.  Torsemide  was increased to 40 qam/20 qpm.   Patient was set up for RHC in 3/25 due to ongoing severe fatigue. This showed low CI 2.02 by thermodilution as well as moderate pulmonary arterial hypertension, he was admitted and started on milrinone  gtt.  He was gradually titrated off milrinone  and maintained a good co-ox off milrinone .  He had a moderate right pleural effusion, underwent right  thoracentesis.  Fluid was transudative but grew E coli, he was treated with Bactrim  based on sensitivities.  Given early satiety and poor appetite, he had an EGD that showed gastritic, biopsies showed no H pylori or gastric cancer.  HRCT chest showed bibasilar bronchiectasis, ?aspiration. However, swallow study was not suggestive of aspiration. V/Q scan was done due to pulmonary hypertension, this showed no chronic PE.  He was started on sildenafil  and meds were titrated, he felt better when he went home.   Echo in 3/25 showed EF 25-30%, mild RV dysfunction, moderate MR, PASP 62 mmHg.   Biotronik ICD placed in 5/25.  Patient was admitted again in 6/25 with dyspnea and fatigue.  BP was low, midodrine  was started and spironolactone  and tadalafil  were stopped.  RHC again showed mixed pulmonary arterial/venous hypertension and low CI at 2.1 by Fick and thermodilution.  Echo showed EF 20-25%, mild LV dilation, moderate RV dysfunction, normal RV size, PASP 44 mmHg, mild-moderate MR.    Patient had right thoracentesis in 11/25, 1.1 L out, cytology negative.     Patient returns for followup of CHF.  Weight is up 17 lbs.  BP 89/66 though he denies lightheadedness. He has developed orthopnea and has episodes of PND.  He denies dyspnea but walked in very slowly.  He is fatigued walking up stairs. No chest pain.    Labs (12/24): hgb 15.1, BNP 350, K 4.4, creatinine 1.24 Labs (2/25): K 4.1, creatinine 1.48, BNP 481, LFTs normal, free T3/T4 normal Labs (3/25): K 3.5, creatinine 1.44 =>  1.27, hgb 13.4, BNP 812, HS-TnI 44 => 39 Labs (6/25): K 4, creatinine 1.39 Labs (7/25): digoxin  0.8, K 4.1, creatinine 1.46, BNP 1374 Labs (8/25): Transferrin saturation 6.5%, hgb 12.9, LFTs normal, K 3.8, creatinine 1.34, BNP 1478 Labs (10/25): Ferritin 19, transferrin saturation 6%, digoxin  < 0.6, TSH 7.9, BNP 1887, K 4.4, creatinine 1.3 => 1.22, LFTs normal, hgb 12.9 Labs (11/25): digoxin  < 0.6, TSH 5.7, K 4.4, creatinine  1.54, LDL 84 Labs (12/25): hgb 14.3  ECG (personally reviewed): NSR, old ASMI, RBBB  SH: No ETOH or tobacco use.  Lives at home in [place] with his wife and children (4 daughters). Originally from [place].   PMH: 1. Chronic systolic CHF: Ischemic cardiomyopathy.  Biotronik ICD.  - Echo (12/24): EF 20-25%, LAD territory WMAs, moderate LVH, moderately decreased RV systolic function, mild MR.  - Echo (2/25): EF 25% with mild RV dysfunction and mild RV enlargement, PASP 60 mmHg, moderate MR.  - RHC (3/25): mean RA 2, PA 60/18 mean 31, mean PCWP 17, CI 2.02 thermo, CI 2.52 Fick, PAPi 21, PVR 3.7 WU.  - Echo (3/25): EF 25-30%, mild RV dysfunction, moderate MR, PASP 62 mmHg.  - Echo (6/25): EF 20-25%, mild LV dilation, moderate RV dysfunction, normal RV size, PASP 44 mmHg, mild-moderate MR. - RHC (6/25): mean RA 5, PA 62/21 mean 35, mean PCWP 23, CI 2.1 by Fick and thermo, PVR 3.0 WU.  2. CAD: Inferior STEMI 10/23 with DES to RCA.  - Anterior STEMI (11/24): Occluded proximal LAD treated with DES, occluded D2, 90% OM1, 95% distal LCx.  Distal LCx and OM1 treated medically.  3. Atrial fibrillation: Paroxysmal.  - DCCV in 12/24.  4. H pylori gastritis 5. Hyperlipidemia 6. Type 2 diabetes 7. Pulmonary hypertension: RHC (3/25) with mean RA 2, PA 60/18 mean 31, mean PCWP 17, CI 2.02 thermo, CI 2.52 Fick, PAPi 21, PVR 3.7 WU. - V/Q scan (3/25): No chronic PE.  - HRCT chest (3/25): bibasilar bronchiectasis, ?aspiration.  - ANA, RF, anti-SCL70 Ab, anti-centromere Ab all negative.  8. Pleural effusion: On right, s/p thoracentesis.  9. Gastric emptying study in 9/25: normal 10. Fe deficiency anemia  Current Outpatient Medications  Medication Sig Dispense Refill   amiodarone  (PACERONE ) 200 MG tablet Take 1/2 tablet (100 mg total) by mouth daily. 30 tablet 5   apixaban  (ELIQUIS ) 5 MG TABS tablet Take 1 tablet (5 mg total) by mouth 2 (two) times daily.     clopidogrel  (PLAVIX ) 75 MG tablet Take 1  tablet (75 mg total) by mouth daily. 90 tablet 3   digoxin  (LANOXIN ) 0.125 MG tablet Take 1 tablet (0.125 mg total) by mouth daily. 90 tablet 3   ezetimibe  (ZETIA ) 10 MG tablet Take 1 tablet (10 mg total) by mouth daily. 90 tablet 3   Furosemide  (FUROSCIX ) 80 MG/10ML CTKT Inject 80 mg into the skin daily at 12 noon. Please inject Furoscix  today, Thursday, Friday and Saturday. Only use other Furoscix  as directed by HF clinic 10 each 0   JARDIANCE  25 MG TABS tablet TAKE ONE TABLET BY MOUTH DAILY 90 tablet 2   levothyroxine  (SYNTHROID ) 112 MCG tablet Take 1 tablet (112 mcg total) by mouth daily. 90 tablet 1   linaclotide  (LINZESS ) 145 MCG CAPS capsule Take 1 capsule (145 mcg total) by mouth daily before breakfast. Lot: [id number], exp: 03-2024 (Patient taking differently: Take 145 mcg by mouth daily as needed. Lot: [id number], exp: 03-2024) 12 capsule 0   midodrine  (PROAMATINE ) 5 MG tablet Take  1 tablet (5 mg total) by mouth 3 (three) times daily with meals. 90 tablet 0   pantoprazole  (PROTONIX ) 40 MG tablet Take 1 tablet (40 mg total) by mouth 2 (two) times daily before a meal. 180 tablet 3   rosuvastatin  (CRESTOR ) 40 MG tablet Take 40 mg by mouth daily.     tadalafil  (CIALIS ) 20 MG tablet Take 1 tablet (20 mg total) by mouth daily. 90 tablet 3   Vericiguat  (VERQUVO ) 5 MG TABS Take 1 tablet (5 mg total) by mouth daily. 30 tablet 3   Vitamin D-Vitamin K (VITAMIN K2-VITAMIN D3 PO) Take 1 tablet by mouth daily with lunch.     acetaminophen  (TYLENOL ) 325 MG tablet Take 1-2 tablets (325-650 mg total) by mouth every 4 (four) hours as needed for mild pain (pain score 1-3). (Patient not taking: Reported on 01/17/2024)     hyoscyamine  (LEVSIN ) 0.125 MG tablet Take 1 tablet (0.125 mg total) by mouth every 6 (six) hours as needed for cramping. (Patient not taking: Reported on 01/17/2024) 30 tablet 3   melatonin 5 MG TABS Take 5 mg by mouth at bedtime as needed (sleep). (Patient not taking: Reported on 01/17/2024)     potassium  chloride SA (KLOR-CON  M) 20 MEQ tablet Take 2 tablets (40 mEq total) by mouth 2 (two) times daily. 180 tablet 6   torsemide  (DEMADEX ) 20 MG tablet Take 3 tablets (60 mg total) by mouth 2 (two) times daily. 180 tablet 6   Current Facility-Administered Medications  Medication Dose Route Frequency Provider Last Rate Last Admin   furosemide  (LASIX ) injection 80 mg  80 mg Intravenous Once Rolan Ezra RAMAN, MD       potassium chloride  SA (KLOR-CON  M) CR tablet 60 mEq  60 mEq Oral Once Rolan Ezra RAMAN, MD       Allergies  Allergen Reactions   Bovine (Beef) Protein-Containing Drug Products    Porcine (Pork) Protein-Containing Drug Products Other (See Comments)    Religious observance   Family History  Problem Relation Age of Onset   Diabetes Mother    Chronic Renal Failure Father    Diabetes Father    Chronic Renal Failure Brother    Diabetes Brother    Diabetes Brother    Heart attack Brother    Stroke Brother    Wt Readings from Last 3 Encounters:  01/17/24 71.5 kg (157 lb 9.6 oz)  12/12/23 65.3 kg (144 lb)  12/11/23 65.5 kg (144 lb 6 oz)  BP (!) 89/66 Comment: 75  Pulse 78   Ht 6' (1.829 m)   Wt 71.5 kg (157 lb 9.6 oz)   SpO2 99%   BMI 21.37 kg/m   PHYSICAL EXAM: General: NAD Neck: JVP 14 cm, no thyromegaly or thyroid  nodule.  Lungs: Decreased BS right base. CV: Nondisplaced PMI.  Heart regular S1/S2, no S3/S4, no murmur.  2+ edema to knees bilaterally.  No carotid bruit.  Normal pedal pulses.  Abdomen: Soft, nontender, no hepatosplenomegaly, no distention.  Skin: Intact without lesions or rashes.  Neurologic: Alert and oriented x 3.  Psych: Normal affect. Extremities: No clubbing or cyanosis.  HEENT: Normal.   ASSESSMENT & PLAN: 1. Chronic systolic CHF/ischemic cardiomyopathy:  Biotronik ICD.  Echo 12/24 with EF 20-25%, no LV thrombus, AK mid septum to apex, moderately reduced RV systolic function. Echo 2/25 with EF 25%, mildly reduced RV function.  Low BP has limited  GDMT titration.  His profound fatigue has been concerning for low output HF. RHC 3/25  showed low CI by thermo (2.05) but preserved by Fick.  Borderline elevated PCWP, normal RA pressure.  There was also moderate mixed pulmonary arterial/pulmonary venous hypertension.  With his significant symptoms, he was started on milrinone  0.25 and added sildenafil  to try to lower PA pressure.  Milrinone  was later weaned off with medication adjustment, and co-ox remained excellent.  He was readmitted in 6/25 with CHF; RHC showed moderate mixed pulmonary arterial/pulmonary venous hypertension with CI low at 2.1 by Fick and thermo.  Echo in 6/25 showed EF 20-25%, mild LV dilation, moderate RV dysfunction, normal RV size, PASP 44 mmHg, mild-moderate MR.  We have had to peel back on GDMT due to low BP and he is now taking midodrine .  With low output heart failure (CI 2.1 on last RHC and inability to tolerate GDMT - now on midodrine ), we have been discussing advanced therapies.  He is not interested in transplant and at his age, it would be more difficult to obtain. I think he would be a good LVAD candidate. We have a window at this time while his RV function is not severely depressed and his renal function is relatively preserved.  He has completed workup.  We had him set up for LVAD surgery, but he decided that he wanted to wait longer (daughter to get married soon).  Since last appointment, he has gained 17 lbs and is significantly volume overloaded.  NYHA class III symptoms (and based on watching him walk, I think he is under-reporting his symptoms).  I am concerned that we miss his window for LVAD implantation.  - I will have him take Furoscix  80 mg [place] daily x 4 days.  He will not take torsemide  while on Furoscix , after finishing Furoscix  he will start on a higher dose of torsemide , 40 mg bid.  He will take KCl 40 bid. BMET/BNP today, BMET in next week.  - Continue Jardiance  10 mg daily. - Continue digoxin  0.125 daily, check  digoxin  level today.  - He is on midodrine  5 mg tid to maintain MAP.  BP is low today but he denies lightheadedness.   - off Toprol  XL with low output.  - Off spironolactone  with low BP.  - Continue Verquvo  5 mg daily.  - We again discussed LVAD.  I would like to optimize his volume status and then get LVAD surgery arranged.  He does seem amenable to LVAD placement, but I think that part of the delay has been that his daughters have not wanted him to have surgery. I have asked him to have his daughters and wife come in next week with him to discuss advanced therapies.  As above, I am concerned that we could miss our window of opportunity for LVAD placement.  2. CAD:  Hx inferior STEMI 10/23 s/p PCI/DES to RCA. Anterior STEMI 12/10/22, cath showed occluded pLAD treated with PCI/DES, prior RCA stent patent, severe disease OM1 and distal LCx treated medically. No chest pain.  - Continue Plavix  => will need to hold this if he agrees to LVAD.   - No ASA given Eliquis  use.  - Continue rosuvastatin  40 mg + Zetia  10 mg daily.  LDL above goal < 55 in 11/25, would ideally get on Repatha but for now will focus on preparation for LVAD.   3. Atrial fibrillation: Paroxysmal. Has been in NSR.  - continue Eliquis  5 mg bid  - Continue amiodarone  100 mg daily, check LFTs and TSH today.  He will need a regular eye  exam.  4. DM II: Continue Jardiance   5. 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 Not a smoker, no known lung disease. CXR has shown some chronic interstitial changes. HRCT chest with bibasilar bronchiectasis that may be due to aspiration (though swallow study negative).  V/Q scan showed no chronic PE. Rheumatologic serologies negative - Continue tadalafil  20 mg daily.   6. Pleural effusion: S/p right thoracentesis 3/25 and again in 11/25. Pleural fluid was transudative by Light's criteria, no malignant cells.  7. GI: History of early satiety, GI discomfort.   CT abdomen/pelvis with gastric wall thickening. GI was consulted and EGD was performed, no definite evidence for malignancy, biopsies taken for H pylori testing and pathology => negative for H pylori and no gastric cancer.  Gastric emptying study normal in 9/25.  Abdominal US  showed normal appearing liver in 9/25.  Virtual colonoscopy unremarkable.  He actually is feeling better with improved appetite.  8. 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  - Continue Levoxyl , check TSH.  9. Fe deficiency anemia: He has had IV FE.   I spent 42 minutes reviewing records, interviewing/examining patient, and managing orders. Patient's daughter was present.   Followup next week in LVAD clinic, he will bring his family with him.    Ezra Shuck 01/17/2024   "

## 2024-01-18 ENCOUNTER — Telehealth (HOSPITAL_COMMUNITY): Payer: Self-pay | Admitting: *Deleted

## 2024-01-18 ENCOUNTER — Other Ambulatory Visit (HOSPITAL_COMMUNITY): Payer: Self-pay

## 2024-01-18 LAB — PRO BRAIN NATRIURETIC PEPTIDE: Pro Brain Natriuretic Peptide: 1945 pg/mL — ABNORMAL HIGH

## 2024-01-18 LAB — CUP PACEART REMOTE DEVICE CHECK
Date Time Interrogation Session: 20260107094242
Implantable Lead Connection Status: 753985
Implantable Lead Implant Date: 20250522
Implantable Lead Location: 753860
Implantable Lead Serial Number: 81628712
Implantable Pulse Generator Implant Date: 20250522
Pulse Gen Model: 429525
Pulse Gen Serial Number: 88734280

## 2024-01-18 NOTE — Telephone Encounter (Signed)
 Received voicemail from pt's daughter Rana reporting Furoscix  cartridge infused for 1 hour 15 minutes yesterday before device malfunctioned. Asking next steps. VAD coordinator attempted to return call- call goes straight to voicemail.   Spoke with Dr Rolan re: the above. Continue using Furoscix  as discussed in clinic. Will advise pt to call the company for replacement device.   Called and spoke with pt directly. Offered to have pt come to clinic for application today. He states they had no issue with application process, that the device just stopped working after 1 hr 15 min. Advised to call company to report issue, and to request replacement. Advised to continue Furoscix  as instructed in clinic yesterday. Instructed to notify VAD coordinators if any further issues occur. He verbalized understanding to all the above.   Isaiah Knoll RN VAD Coordinator  Office: [phone]  24/7 Pager: [phone]

## 2024-01-19 ENCOUNTER — Telehealth (HOSPITAL_COMMUNITY): Payer: Self-pay | Admitting: Cardiology

## 2024-01-19 NOTE — Telephone Encounter (Signed)
 Patients daughter left VM on triage reporting  First dose of furoscix  only ran 1.5 hr. Reports second dose ran full 5 hrs.  Aware to contact company for replacement, however questioned if there is a need to change plan since pt did not receive full dose

## 2024-01-20 ENCOUNTER — Ambulatory Visit: Payer: Self-pay | Admitting: Cardiology

## 2024-01-22 NOTE — Progress Notes (Signed)
 Remote ICD Transmission

## 2024-01-23 ENCOUNTER — Ambulatory Visit (HOSPITAL_BASED_OUTPATIENT_CLINIC_OR_DEPARTMENT_OTHER)
Admission: RE | Admit: 2024-01-23 | Discharge: 2024-01-23 | Disposition: A | Discharge status: Home / Self Care | Source: Ambulatory Visit | Attending: Cardiology | Admitting: Cardiology

## 2024-01-23 ENCOUNTER — Other Ambulatory Visit (HOSPITAL_COMMUNITY): Payer: Self-pay

## 2024-01-23 ENCOUNTER — Ambulatory Visit (HOSPITAL_COMMUNITY): Payer: Self-pay | Admitting: Cardiology

## 2024-01-23 VITALS — BP 96/71 | HR 76 | Wt 161.5 lb

## 2024-01-23 DIAGNOSIS — Z9889 Other specified postprocedural states: Secondary | ICD-10-CM | POA: Insufficient documentation

## 2024-01-23 DIAGNOSIS — I110 Hypertensive heart disease with heart failure: Secondary | ICD-10-CM | POA: Insufficient documentation

## 2024-01-23 DIAGNOSIS — I5022 Chronic systolic (congestive) heart failure: Secondary | ICD-10-CM

## 2024-01-23 DIAGNOSIS — R6881 Early satiety: Secondary | ICD-10-CM | POA: Insufficient documentation

## 2024-01-23 DIAGNOSIS — Z955 Presence of coronary angioplasty implant and graft: Secondary | ICD-10-CM | POA: Insufficient documentation

## 2024-01-23 DIAGNOSIS — J90 Pleural effusion, not elsewhere classified: Secondary | ICD-10-CM | POA: Insufficient documentation

## 2024-01-23 DIAGNOSIS — Z79890 Hormone replacement therapy (postmenopausal): Secondary | ICD-10-CM | POA: Insufficient documentation

## 2024-01-23 DIAGNOSIS — Z7902 Long term (current) use of antithrombotics/antiplatelets: Secondary | ICD-10-CM | POA: Insufficient documentation

## 2024-01-23 DIAGNOSIS — R059 Cough, unspecified: Secondary | ICD-10-CM | POA: Insufficient documentation

## 2024-01-23 DIAGNOSIS — J479 Bronchiectasis, uncomplicated: Secondary | ICD-10-CM | POA: Insufficient documentation

## 2024-01-23 DIAGNOSIS — I2510 Atherosclerotic heart disease of native coronary artery without angina pectoris: Secondary | ICD-10-CM | POA: Insufficient documentation

## 2024-01-23 DIAGNOSIS — I2721 Secondary pulmonary arterial hypertension: Secondary | ICD-10-CM | POA: Insufficient documentation

## 2024-01-23 DIAGNOSIS — Z7901 Long term (current) use of anticoagulants: Secondary | ICD-10-CM | POA: Insufficient documentation

## 2024-01-23 DIAGNOSIS — Z95810 Presence of automatic (implantable) cardiac defibrillator: Secondary | ICD-10-CM | POA: Insufficient documentation

## 2024-01-23 DIAGNOSIS — D539 Nutritional anemia, unspecified: Secondary | ICD-10-CM | POA: Insufficient documentation

## 2024-01-23 DIAGNOSIS — Z7984 Long term (current) use of oral hypoglycemic drugs: Secondary | ICD-10-CM | POA: Insufficient documentation

## 2024-01-23 DIAGNOSIS — I272 Pulmonary hypertension, unspecified: Secondary | ICD-10-CM | POA: Insufficient documentation

## 2024-01-23 DIAGNOSIS — E8770 Fluid overload, unspecified: Secondary | ICD-10-CM | POA: Insufficient documentation

## 2024-01-23 DIAGNOSIS — E785 Hyperlipidemia, unspecified: Secondary | ICD-10-CM | POA: Insufficient documentation

## 2024-01-23 DIAGNOSIS — Z8719 Personal history of other diseases of the digestive system: Secondary | ICD-10-CM | POA: Insufficient documentation

## 2024-01-23 DIAGNOSIS — E039 Hypothyroidism, unspecified: Secondary | ICD-10-CM | POA: Insufficient documentation

## 2024-01-23 DIAGNOSIS — R5383 Other fatigue: Secondary | ICD-10-CM | POA: Insufficient documentation

## 2024-01-23 DIAGNOSIS — I5023 Acute on chronic systolic (congestive) heart failure: Secondary | ICD-10-CM | POA: Diagnosis not present

## 2024-01-23 DIAGNOSIS — J948 Other specified pleural conditions: Secondary | ICD-10-CM | POA: Insufficient documentation

## 2024-01-23 DIAGNOSIS — I255 Ischemic cardiomyopathy: Secondary | ICD-10-CM | POA: Insufficient documentation

## 2024-01-23 DIAGNOSIS — R630 Anorexia: Secondary | ICD-10-CM | POA: Insufficient documentation

## 2024-01-23 DIAGNOSIS — R0601 Orthopnea: Secondary | ICD-10-CM | POA: Insufficient documentation

## 2024-01-23 DIAGNOSIS — I959 Hypotension, unspecified: Secondary | ICD-10-CM | POA: Insufficient documentation

## 2024-01-23 DIAGNOSIS — Z79899 Other long term (current) drug therapy: Secondary | ICD-10-CM | POA: Insufficient documentation

## 2024-01-23 DIAGNOSIS — I480 Paroxysmal atrial fibrillation: Secondary | ICD-10-CM | POA: Insufficient documentation

## 2024-01-23 DIAGNOSIS — D509 Iron deficiency anemia, unspecified: Secondary | ICD-10-CM | POA: Insufficient documentation

## 2024-01-23 DIAGNOSIS — I252 Old myocardial infarction: Secondary | ICD-10-CM | POA: Insufficient documentation

## 2024-01-23 DIAGNOSIS — E119 Type 2 diabetes mellitus without complications: Secondary | ICD-10-CM | POA: Insufficient documentation

## 2024-01-23 LAB — COMPREHENSIVE METABOLIC PANEL WITH GFR
ALT: 15 U/L (ref 0–44)
AST: 22 U/L (ref 15–41)
Albumin: 3.9 g/dL (ref 3.5–5.0)
Alkaline Phosphatase: 167 U/L — ABNORMAL HIGH (ref 38–126)
Anion gap: 9 (ref 5–15)
BUN: 25 mg/dL — ABNORMAL HIGH (ref 8–23)
CO2: 30 mmol/L (ref 22–32)
Calcium: 9.3 mg/dL (ref 8.9–10.3)
Chloride: 98 mmol/L (ref 98–111)
Creatinine, Ser: 1.18 mg/dL (ref 0.61–1.24)
GFR, Estimated: >60 mL/min
Glucose, Bld: 124 mg/dL — ABNORMAL HIGH (ref 70–99)
Potassium: 3.4 mmol/L — ABNORMAL LOW (ref 3.5–5.1)
Sodium: 136 mmol/L (ref 135–145)
Total Bilirubin: 0.7 mg/dL (ref 0.0–1.2)
Total Protein: 7.5 g/dL (ref 6.5–8.1)

## 2024-01-23 LAB — PRO BRAIN NATRIURETIC PEPTIDE: Pro Brain Natriuretic Peptide: 3357 pg/mL — ABNORMAL HIGH

## 2024-01-23 LAB — DIGOXIN LEVEL: Digoxin Level: <0.6 ng/mL — ABNORMAL LOW (ref 0.8–2.0)

## 2024-01-23 NOTE — Progress Notes (Signed)
 Patient presents for 1 week follow up in VAD Clinic today alone.  Patient very slowly ambulated into clinic independently. States he feels like he has fluid on board today. Pts wt is up another 4 lbs today. He notes orthopnea and PND as well swelling in his feet/legs. Pt insists he must sit on edge of stretcher due to increased orthopnea. Denies lightheadedness, dizziness, falls, shortness of breath, and signs of bleeding.   Pt prescribed Furosix 80mg  for 4 days without improvement. Verified that Furosix was applied correctly and all cartridges dispensed medication with pt and his daughter. Pt resumed Torsemide  yesterday but only took one dose. Pt states he forgot to take the second dose.   Dr Rolan again had lengthy conversation with pt and his daughter about his life expectancy without having some type of advanced therapy. Pt in agreement to plan for RHC tomorrow with admission to follow for continued discussion about VAD. Pt still undecided if he wants to pursue VAD.   CMET Pro-BNP and Digoxin  level ordered today per Dr. Rolan.  Vital Signs:  HR: 76 BP: 96/71 (79) SPO2: 95%   Weight: 161.5 lb  Last weight: 157.6 lb   Symptom YES NO DETAILS  Angina  x Activity:  Claudication  x How Far:  Syncope  x When:  Stroke  x   Orthopnea x  How many pillows: 3-4  PND x  How often:  CPAP  x How many hours:  Pedal Edema x  Bilat feet/leg  Abdominal Fullness  x   Nausea / Vomit  x Increased appetite  Diaphoresis  x When:  Shortness of Breath x  Activity: lying flat  Palpitations  x When:  ICD shock  x   Bleeding S/S  x   Tea-colored Urine  x   Hospitalizations  x   Emergency Room  x   Other MD     Activity Activity decreased due to heaviness in legs  Fluid   Diet Regular    Device: Biotronik Therapies: on  Last check: 10/18/2023  Patient Instructions:  RHC to be scheduled for tomorrow. Will call with procedure instructions once scheduled.  Schuyler Lunger RN, BSN VAD  Coordinator 24/7 Pager [phone]

## 2024-01-23 NOTE — Progress Notes (Signed)
 "   Advanced Heart Failure Clinic Note  PCP: Vincente Shivers, NP HF Cardiology: Dr. Rolan  Chief complaint: CHF  69 y.o. with history of CAD, DM II, HTN, HLD, ischemic cardiomyopathy/chronic systolic CHF.   Patient had inferior STEMI 10/23 s/p PCI/DES to RCA. Had residual diffuse disease in diagonals and OMs. EF 40-45% at time of cath. Subsequent echo in 10/23 with EF 55-60%.   He was not taking medications in 2024 after losing insurance. Presented with anterior STEMI 12/10/22. Cardiac cath with 100% proximal LAD treated with PCI/DES. Prior RCA stent patent, severe disease in tortuous ramus and distal LCx treated medically.  Echo with EF 25%, no LV thrombus, AK mid septum to apex, moderately reduced RV systolic function. Course complicated by atrial fibrillation with RVR. Started on IV amiodarone  and underwent DCCV to SR.  GDMT limited by soft blood pressure. Also seen by GI for abdominal pain. Had thickening of stomach on CT scan. H. Pylori later resulted positive after discharge, seen by GI and started on treatment for H pylori-related gastritis.   Returned to ED 12/19/22 with recurrent atrial fibrillation. He converted to SR while in waiting room and was discharged home.  Admitted 2/25 with acute CHF. Diuresed with IV lasix . GDMT cut back 2/2 to low BP, Entresto  and spironolactone  stopped. He was discharged home, weight 154 lbs.  Echo in 2/25 showed EF 25% with mild RV dysfunction and mild RV enlargement, PASP 60 mmHg, moderate MR.   He was seen again in the ER 03/14/23 with worsening fatigue and dyspnea.  Torsemide  was increased to 40 qam/20 qpm.   Patient was set up for RHC in 3/25 due to ongoing severe fatigue. This showed low CI 2.02 by thermodilution as well as moderate pulmonary arterial hypertension, he was admitted and started on milrinone  gtt.  He was gradually titrated off milrinone  and maintained a good co-ox off milrinone .  He had a moderate right pleural effusion, underwent right  thoracentesis.  Fluid was transudative but grew E coli, he was treated with Bactrim  based on sensitivities.  Given early satiety and poor appetite, he had an EGD that showed gastritic, biopsies showed no H pylori or gastric cancer.  HRCT chest showed bibasilar bronchiectasis, ?aspiration. However, swallow study was not suggestive of aspiration. V/Q scan was done due to pulmonary hypertension, this showed no chronic PE.  He was started on sildenafil  and meds were titrated, he felt better when he went home.   Echo in 3/25 showed EF 25-30%, mild RV dysfunction, moderate MR, PASP 62 mmHg.   Biotronik ICD placed in 5/25.  Patient was admitted again in 6/25 with dyspnea and fatigue.  BP was low, midodrine  was started and spironolactone  and tadalafil  were stopped.  RHC again showed mixed pulmonary arterial/venous hypertension and low CI at 2.1 by Fick and thermodilution.  Echo showed EF 20-25%, mild LV dilation, moderate RV dysfunction, normal RV size, PASP 44 mmHg, mild-moderate MR.    Patient had right thoracentesis in 11/25, 1.1 L out, cytology negative.     Patient returns for followup of CHF. Despite use of Furoscix  x 4 days and increase in torsemide , weight is up 4 lbs.  He does not feel like he urinated well with Furoscix .  He is still orthopneic and cannot lie flat.  He has a worsening cough.  Dyspneic with stairs inclines, still doing ok on flat ground.  No lightheadedness, no chest pain.     Labs (12/24): hgb 15.1, BNP 350, K 4.4, creatinine 1.24  Labs (2/25): K 4.1, creatinine 1.48, BNP 481, LFTs normal, free T3/T4 normal Labs (3/25): K 3.5, creatinine 1.44 => 1.27, hgb 13.4, BNP 812, HS-TnI 44 => 39 Labs (6/25): K 4, creatinine 1.39 Labs (7/25): digoxin  0.8, K 4.1, creatinine 1.46, BNP 1374 Labs (8/25): Transferrin saturation 6.5%, hgb 12.9, LFTs normal, K 3.8, creatinine 1.34, BNP 1478 Labs (10/25): Ferritin 19, transferrin saturation 6%, digoxin  < 0.6, TSH 7.9, BNP 1887, K 4.4, creatinine 1.3  => 1.22, LFTs normal, hgb 12.9 Labs (11/25): digoxin  < 0.6, TSH 5.7, K 4.4, creatinine 1.54, LDL 84 Labs (12/25): hgb 14.3 Labs (1/26): pro-BNP 1945, TSH normal, AST/ALT normal, K 4, creatinine 1.39, hgb 12.8  SH: No ETOH or tobacco use.  Lives at home in [place] with his wife and children (4 daughters). Originally from [place].   PMH: 1. Chronic systolic CHF: Ischemic cardiomyopathy.  Biotronik ICD.  - Echo (12/24): EF 20-25%, LAD territory WMAs, moderate LVH, moderately decreased RV systolic function, mild MR.  - Echo (2/25): EF 25% with mild RV dysfunction and mild RV enlargement, PASP 60 mmHg, moderate MR.  - RHC (3/25): mean RA 2, PA 60/18 mean 31, mean PCWP 17, CI 2.02 thermo, CI 2.52 Fick, PAPi 21, PVR 3.7 WU.  - Echo (3/25): EF 25-30%, mild RV dysfunction, moderate MR, PASP 62 mmHg.  - Echo (6/25): EF 20-25%, mild LV dilation, moderate RV dysfunction, normal RV size, PASP 44 mmHg, mild-moderate MR. - RHC (6/25): mean RA 5, PA 62/21 mean 35, mean PCWP 23, CI 2.1 by Fick and thermo, PVR 3.0 WU.  2. CAD: Inferior STEMI 10/23 with DES to RCA.  - Anterior STEMI (11/24): Occluded proximal LAD treated with DES, occluded D2, 90% OM1, 95% distal LCx.  Distal LCx and OM1 treated medically.  3. Atrial fibrillation: Paroxysmal.  - DCCV in 12/24.  4. H pylori gastritis 5. Hyperlipidemia 6. Type 2 diabetes 7. Pulmonary hypertension: RHC (3/25) with mean RA 2, PA 60/18 mean 31, mean PCWP 17, CI 2.02 thermo, CI 2.52 Fick, PAPi 21, PVR 3.7 WU. - V/Q scan (3/25): No chronic PE.  - HRCT chest (3/25): bibasilar bronchiectasis, ?aspiration.  - ANA, RF, anti-SCL70 Ab, anti-centromere Ab all negative.  8. Pleural effusion: On right, s/p thoracentesis.  9. Gastric emptying study in 9/25: normal 10. Fe deficiency anemia  Current Outpatient Medications  Medication Sig Dispense Refill   acetaminophen  (TYLENOL ) 325 MG tablet Take 1-2 tablets (325-650 mg total) by mouth every 4 (four) hours as needed for  mild pain (pain score 1-3).     amiodarone  (PACERONE ) 200 MG tablet Take 1/2 tablet (100 mg total) by mouth daily. 30 tablet 5   apixaban  (ELIQUIS ) 5 MG TABS tablet Take 1 tablet (5 mg total) by mouth 2 (two) times daily.     clopidogrel  (PLAVIX ) 75 MG tablet Take 1 tablet (75 mg total) by mouth daily. 90 tablet 3   digoxin  (LANOXIN ) 0.125 MG tablet Take 1 tablet (0.125 mg total) by mouth daily. 90 tablet 3   ezetimibe  (ZETIA ) 10 MG tablet Take 1 tablet (10 mg total) by mouth daily. 90 tablet 3   Furosemide  (FUROSCIX ) 80 MG/10ML CTKT Inject 80 mg into the skin daily at 12 noon. Please inject Furoscix  today, Thursday, Friday and Saturday. Only use other Furoscix  as directed by HF clinic 10 each 0   JARDIANCE  25 MG TABS tablet TAKE ONE TABLET BY MOUTH DAILY 90 tablet 2   levothyroxine  (SYNTHROID ) 112 MCG tablet Take 1 tablet (112 mcg total)  by mouth daily. 90 tablet 1   linaclotide  (LINZESS ) 145 MCG CAPS capsule Take 1 capsule (145 mcg total) by mouth daily before breakfast. Lot: [id number], exp: 03-2024 (Patient taking differently: Take 145 mcg by mouth daily as needed. Lot: [id number], exp: 03-2024) 12 capsule 0   melatonin 5 MG TABS Take 5 mg by mouth at bedtime as needed (sleep).     midodrine  (PROAMATINE ) 5 MG tablet Take 1 tablet (5 mg total) by mouth 3 (three) times daily with meals. 90 tablet 0   pantoprazole  (PROTONIX ) 40 MG tablet Take 1 tablet (40 mg total) by mouth 2 (two) times daily before a meal. 180 tablet 3   potassium chloride  SA (KLOR-CON  M) 20 MEQ tablet Take 2 tablets (40 mEq total) by mouth 2 (two) times daily. 180 tablet 6   rosuvastatin  (CRESTOR ) 40 MG tablet Take 40 mg by mouth daily.     tadalafil  (CIALIS ) 20 MG tablet Take 1 tablet (20 mg total) by mouth daily. 90 tablet 3   torsemide  (DEMADEX ) 20 MG tablet Take 3 tablets (60 mg total) by mouth 2 (two) times daily. 180 tablet 6   Vericiguat  (VERQUVO ) 5 MG TABS Take 1 tablet (5 mg total) by mouth daily. 30 tablet 3   Vitamin  D-Vitamin K (VITAMIN K2-VITAMIN D3 PO) Take 1 tablet by mouth daily with lunch.     hyoscyamine  (LEVSIN ) 0.125 MG tablet Take 1 tablet (0.125 mg total) by mouth every 6 (six) hours as needed for cramping. (Patient not taking: Reported on 01/23/2024) 30 tablet 3   No current facility-administered medications for this encounter.   Allergies  Allergen Reactions   Bovine (Beef) Protein-Containing Drug Products    Porcine (Pork) Protein-Containing Drug Products Other (See Comments)    Religious observance   Family History  Problem Relation Age of Onset   Diabetes Mother    Chronic Renal Failure Father    Diabetes Father    Chronic Renal Failure Brother    Diabetes Brother    Diabetes Brother    Heart attack Brother    Stroke Brother    Wt Readings from Last 3 Encounters:  01/23/24 73.3 kg (161 lb 8 oz)  01/17/24 71.5 kg (157 lb 9.6 oz)  12/12/23 65.3 kg (144 lb)  BP 96/71 Comment: 79  Pulse 76   Wt 73.3 kg (161 lb 8 oz)   SpO2 100%   BMI 21.90 kg/m   PHYSICAL EXAM: General: NAD Neck: JVP 14 cm, no thyromegaly or thyroid  nodule.  Lungs: Decreased BS right base. CV: Nondisplaced PMI.  Heart regular S1/S2, no S3/S4, no murmur.  2+ edema to knees.  No carotid bruit.  Normal pedal pulses.  Abdomen: Soft, nontender, no hepatosplenomegaly, no distention.  Skin: Intact without lesions or rashes.  Neurologic: Alert and oriented x 3.  Psych: Normal affect. Extremities: No clubbing or cyanosis.  HEENT: Normal.   ASSESSMENT & PLAN: 1. Chronic systolic CHF/ischemic cardiomyopathy:  Biotronik ICD.  Echo 12/24 with EF 20-25%, no LV thrombus, AK mid septum to apex, moderately reduced RV systolic function. Echo 2/25 with EF 25%, mildly reduced RV function.  Low BP has limited GDMT titration.  His profound fatigue has been concerning for low output HF. RHC 3/25 showed low CI by thermo (2.05) but preserved by Fick.  Borderline elevated PCWP, normal RA pressure.  There was also moderate mixed  pulmonary arterial/pulmonary venous hypertension.  With his significant symptoms, he was started on milrinone  0.25 and added sildenafil  to try to  lower PA pressure.  Milrinone  was later weaned off with medication adjustment, and co-ox remained excellent.  He was readmitted in 6/25 with CHF; RHC showed moderate mixed pulmonary arterial/pulmonary venous hypertension with CI low at 2.1 by Fick and thermo.  Echo in 6/25 showed EF 20-25%, mild LV dilation, moderate RV dysfunction, normal RV size, PASP 44 mmHg, mild-moderate MR.  We have had to peel back on GDMT due to low BP and he is now taking midodrine .  With low output heart failure (CI 2.1 on last RHC and inability to tolerate GDMT - now on midodrine ), we have been discussing advanced therapies.  He is not interested in transplant and at his age, it would be more difficult to obtain. I think he would be a good LVAD candidate. We have a window at this time while his RV function is not severely depressed and his renal function is relatively preserved.  He has completed workup.  We had him set up for LVAD surgery, but he decided that he wanted to wait longer (daughter getting married).  At last appointment, he had gained 17 lbs and was significantly volume overloaded with NYHA class III symptoms.  I gave him Furoscix  over the weekend and increased his torsemide  after that, but today he is up another 4 lbs.  He remains quite volume overloaded and orthopnea is worse.  Unfortunately, he appears to have failed outpatient diuresis.  I remain concerned that we miss his window for LVAD implantation.  - He will continue torsemide  60 mg po bid today.  Check BMET/BNP today.  - I will bring him in for RHC tomorrow. If output is low, I will start him on milrinone  gtt.  I will plan to admit him regardless for diuresis.  - Continue Jardiance  10 mg daily. - Continue digoxin  0.125 daily, check digoxin  level today.  - He is on midodrine  5 mg tid to maintain MAP.  SBP remain in  90s, he denies lightheadedness.   - off Toprol  XL with low output.  - Off spironolactone  with low BP.  - Continue Verquvo  5 mg daily.  - We again discussed LVAD, his daughter is here today.  I would like to optimize his volume status and then get LVAD surgery arranged.  He still seems on the fence about the device.  As above, I am going to bring him in for RHC tomorrow and likely admission for milrinone /diuresis.  We will need to talk more about LVAD.  As above, I am concerned that we could miss our window of opportunity for placement.  2. CAD:  Hx inferior STEMI 10/23 s/p PCI/DES to RCA. Anterior STEMI 12/10/22, cath showed occluded pLAD treated with PCI/DES, prior RCA stent patent, severe disease OM1 and distal LCx treated medically. No chest pain.  - Continue Plavix  => will need to hold this if he agrees to LVAD.   - No ASA given Eliquis  use.  - Continue rosuvastatin  40 mg + Zetia  10 mg daily.  LDL above goal < 55 in 11/25, would ideally get on Repatha but for now will focus on preparation for LVAD.   3. Atrial fibrillation: Paroxysmal. Has been in NSR.  - continue Eliquis  5 mg bid  - Continue amiodarone  100 mg daily, recent LFTs and TSH normal.  He will need a regular eye exam.  4. DM II: Continue Jardiance   5. 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 Not a smoker, no known lung disease. CXR has shown some chronic interstitial changes. HRCT  chest with bibasilar bronchiectasis that may be due to aspiration (though swallow study negative).  V/Q scan showed no chronic PE. Rheumatologic serologies negative - Continue tadalafil  20 mg daily.   6. Pleural effusion: S/p right thoracentesis 3/25 and again in 11/25. Pleural fluid was transudative by Light's criteria, no malignant cells. By exam today, I suspect he has reaccumulated pleural fluid.   - CXR if admitted tomorrow, may need repeat thoracentesis.  7. GI: History of early satiety, GI discomfort.   CT abdomen/pelvis with gastric wall thickening. GI was consulted and EGD was performed, no definite evidence for malignancy, biopsies taken for H pylori testing and pathology => negative for H pylori and no gastric cancer.  Gastric emptying study normal in 9/25.  Abdominal US  showed normal appearing liver in 9/25.  Virtual colonoscopy unremarkable.  He actually is feeling better with improved appetite.  8. 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  - Continue Levoxyl , recent TSH ok.  9. Fe deficiency anemia: He has had IV FE.   I spent 43 minutes reviewing records, interviewing/examining patient, and managing orders. Patient's daughter was present.   RHC tomorrow and likely admission.    Garrett Roberts 01/23/2024   "

## 2024-01-23 NOTE — Progress Notes (Signed)
 RHC scheduled tomorrow at 0830 Per Dr.McLean. Pt instructed to arrive to [facility] Entrance A and check in at admitting 2 hours prior to procedure at 0630. Instructed to be NPO after midnight tonight. Pt will be admitted following procedure per Dr. Rolan therefore advised to bring belongings. Pt verbalized understanding of instructions provided.  Schuyler Lunger RN, BSN VAD Coordinator 24/7 Pager [phone]

## 2024-01-23 NOTE — H&P (View-Only) (Signed)
 "   Advanced Heart Failure Clinic Note  PCP: Vincente Shivers, NP HF Cardiology: Dr. Rolan  Chief complaint: CHF  69 y.o. with history of CAD, DM II, HTN, HLD, ischemic cardiomyopathy/chronic systolic CHF.   Patient had inferior STEMI 10/23 s/p PCI/DES to RCA. Had residual diffuse disease in diagonals and OMs. EF 40-45% at time of cath. Subsequent echo in 10/23 with EF 55-60%.   He was not taking medications in 2024 after losing insurance. Presented with anterior STEMI 12/10/22. Cardiac cath with 100% proximal LAD treated with PCI/DES. Prior RCA stent patent, severe disease in tortuous ramus and distal LCx treated medically.  Echo with EF 25%, no LV thrombus, AK mid septum to apex, moderately reduced RV systolic function. Course complicated by atrial fibrillation with RVR. Started on IV amiodarone  and underwent DCCV to SR.  GDMT limited by soft blood pressure. Also seen by GI for abdominal pain. Had thickening of stomach on CT scan. H. Pylori later resulted positive after discharge, seen by GI and started on treatment for H pylori-related gastritis.   Returned to ED 12/19/22 with recurrent atrial fibrillation. He converted to SR while in waiting room and was discharged home.  Admitted 2/25 with acute CHF. Diuresed with IV lasix . GDMT cut back 2/2 to low BP, Entresto  and spironolactone  stopped. He was discharged home, weight 154 lbs.  Echo in 2/25 showed EF 25% with mild RV dysfunction and mild RV enlargement, PASP 60 mmHg, moderate MR.   He was seen again in the ER 03/14/23 with worsening fatigue and dyspnea.  Torsemide  was increased to 40 qam/20 qpm.   Patient was set up for RHC in 3/25 due to ongoing severe fatigue. This showed low CI 2.02 by thermodilution as well as moderate pulmonary arterial hypertension, he was admitted and started on milrinone  gtt.  He was gradually titrated off milrinone  and maintained a good co-ox off milrinone .  He had a moderate right pleural effusion, underwent right  thoracentesis.  Fluid was transudative but grew E coli, he was treated with Bactrim  based on sensitivities.  Given early satiety and poor appetite, he had an EGD that showed gastritic, biopsies showed no H pylori or gastric cancer.  HRCT chest showed bibasilar bronchiectasis, ?aspiration. However, swallow study was not suggestive of aspiration. V/Q scan was done due to pulmonary hypertension, this showed no chronic PE.  He was started on sildenafil  and meds were titrated, he felt better when he went home.   Echo in 3/25 showed EF 25-30%, mild RV dysfunction, moderate MR, PASP 62 mmHg.   Biotronik ICD placed in 5/25.  Patient was admitted again in 6/25 with dyspnea and fatigue.  BP was low, midodrine  was started and spironolactone  and tadalafil  were stopped.  RHC again showed mixed pulmonary arterial/venous hypertension and low CI at 2.1 by Fick and thermodilution.  Echo showed EF 20-25%, mild LV dilation, moderate RV dysfunction, normal RV size, PASP 44 mmHg, mild-moderate MR.    Patient had right thoracentesis in 11/25, 1.1 L out, cytology negative.     Patient returns for followup of CHF. Despite use of Furoscix  x 4 days and increase in torsemide , weight is up 4 lbs.  He does not feel like he urinated well with Furoscix .  He is still orthopneic and cannot lie flat.  He has a worsening cough.  Dyspneic with stairs inclines, still doing ok on flat ground.  No lightheadedness, no chest pain.     Labs (12/24): hgb 15.1, BNP 350, K 4.4, creatinine 1.24  Labs (2/25): K 4.1, creatinine 1.48, BNP 481, LFTs normal, free T3/T4 normal Labs (3/25): K 3.5, creatinine 1.44 => 1.27, hgb 13.4, BNP 812, HS-TnI 44 => 39 Labs (6/25): K 4, creatinine 1.39 Labs (7/25): digoxin  0.8, K 4.1, creatinine 1.46, BNP 1374 Labs (8/25): Transferrin saturation 6.5%, hgb 12.9, LFTs normal, K 3.8, creatinine 1.34, BNP 1478 Labs (10/25): Ferritin 19, transferrin saturation 6%, digoxin  < 0.6, TSH 7.9, BNP 1887, K 4.4, creatinine 1.3  => 1.22, LFTs normal, hgb 12.9 Labs (11/25): digoxin  < 0.6, TSH 5.7, K 4.4, creatinine 1.54, LDL 84 Labs (12/25): hgb 14.3 Labs (1/26): pro-BNP 1945, TSH normal, AST/ALT normal, K 4, creatinine 1.39, hgb 12.8  SH: No ETOH or tobacco use.  Lives at home in [place] with his wife and children (4 daughters). Originally from [place].   PMH: 1. Chronic systolic CHF: Ischemic cardiomyopathy.  Biotronik ICD.  - Echo (12/24): EF 20-25%, LAD territory WMAs, moderate LVH, moderately decreased RV systolic function, mild MR.  - Echo (2/25): EF 25% with mild RV dysfunction and mild RV enlargement, PASP 60 mmHg, moderate MR.  - RHC (3/25): mean RA 2, PA 60/18 mean 31, mean PCWP 17, CI 2.02 thermo, CI 2.52 Fick, PAPi 21, PVR 3.7 WU.  - Echo (3/25): EF 25-30%, mild RV dysfunction, moderate MR, PASP 62 mmHg.  - Echo (6/25): EF 20-25%, mild LV dilation, moderate RV dysfunction, normal RV size, PASP 44 mmHg, mild-moderate MR. - RHC (6/25): mean RA 5, PA 62/21 mean 35, mean PCWP 23, CI 2.1 by Fick and thermo, PVR 3.0 WU.  2. CAD: Inferior STEMI 10/23 with DES to RCA.  - Anterior STEMI (11/24): Occluded proximal LAD treated with DES, occluded D2, 90% OM1, 95% distal LCx.  Distal LCx and OM1 treated medically.  3. Atrial fibrillation: Paroxysmal.  - DCCV in 12/24.  4. H pylori gastritis 5. Hyperlipidemia 6. Type 2 diabetes 7. Pulmonary hypertension: RHC (3/25) with mean RA 2, PA 60/18 mean 31, mean PCWP 17, CI 2.02 thermo, CI 2.52 Fick, PAPi 21, PVR 3.7 WU. - V/Q scan (3/25): No chronic PE.  - HRCT chest (3/25): bibasilar bronchiectasis, ?aspiration.  - ANA, RF, anti-SCL70 Ab, anti-centromere Ab all negative.  8. Pleural effusion: On right, s/p thoracentesis.  9. Gastric emptying study in 9/25: normal 10. Fe deficiency anemia  Current Outpatient Medications  Medication Sig Dispense Refill   acetaminophen  (TYLENOL ) 325 MG tablet Take 1-2 tablets (325-650 mg total) by mouth every 4 (four) hours as needed for  mild pain (pain score 1-3).     amiodarone  (PACERONE ) 200 MG tablet Take 1/2 tablet (100 mg total) by mouth daily. 30 tablet 5   apixaban  (ELIQUIS ) 5 MG TABS tablet Take 1 tablet (5 mg total) by mouth 2 (two) times daily.     clopidogrel  (PLAVIX ) 75 MG tablet Take 1 tablet (75 mg total) by mouth daily. 90 tablet 3   digoxin  (LANOXIN ) 0.125 MG tablet Take 1 tablet (0.125 mg total) by mouth daily. 90 tablet 3   ezetimibe  (ZETIA ) 10 MG tablet Take 1 tablet (10 mg total) by mouth daily. 90 tablet 3   Furosemide  (FUROSCIX ) 80 MG/10ML CTKT Inject 80 mg into the skin daily at 12 noon. Please inject Furoscix  today, Thursday, Friday and Saturday. Only use other Furoscix  as directed by HF clinic 10 each 0   JARDIANCE  25 MG TABS tablet TAKE ONE TABLET BY MOUTH DAILY 90 tablet 2   levothyroxine  (SYNTHROID ) 112 MCG tablet Take 1 tablet (112 mcg total)  by mouth daily. 90 tablet 1   linaclotide  (LINZESS ) 145 MCG CAPS capsule Take 1 capsule (145 mcg total) by mouth daily before breakfast. Lot: [id number], exp: 03-2024 (Patient taking differently: Take 145 mcg by mouth daily as needed. Lot: [id number], exp: 03-2024) 12 capsule 0   melatonin 5 MG TABS Take 5 mg by mouth at bedtime as needed (sleep).     midodrine  (PROAMATINE ) 5 MG tablet Take 1 tablet (5 mg total) by mouth 3 (three) times daily with meals. 90 tablet 0   pantoprazole  (PROTONIX ) 40 MG tablet Take 1 tablet (40 mg total) by mouth 2 (two) times daily before a meal. 180 tablet 3   potassium chloride  SA (KLOR-CON  M) 20 MEQ tablet Take 2 tablets (40 mEq total) by mouth 2 (two) times daily. 180 tablet 6   rosuvastatin  (CRESTOR ) 40 MG tablet Take 40 mg by mouth daily.     tadalafil  (CIALIS ) 20 MG tablet Take 1 tablet (20 mg total) by mouth daily. 90 tablet 3   torsemide  (DEMADEX ) 20 MG tablet Take 3 tablets (60 mg total) by mouth 2 (two) times daily. 180 tablet 6   Vericiguat  (VERQUVO ) 5 MG TABS Take 1 tablet (5 mg total) by mouth daily. 30 tablet 3   Vitamin  D-Vitamin K (VITAMIN K2-VITAMIN D3 PO) Take 1 tablet by mouth daily with lunch.     hyoscyamine  (LEVSIN ) 0.125 MG tablet Take 1 tablet (0.125 mg total) by mouth every 6 (six) hours as needed for cramping. (Patient not taking: Reported on 01/23/2024) 30 tablet 3   No current facility-administered medications for this encounter.   Allergies  Allergen Reactions   Bovine (Beef) Protein-Containing Drug Products    Porcine (Pork) Protein-Containing Drug Products Other (See Comments)    Religious observance   Family History  Problem Relation Age of Onset   Diabetes Mother    Chronic Renal Failure Father    Diabetes Father    Chronic Renal Failure Brother    Diabetes Brother    Diabetes Brother    Heart attack Brother    Stroke Brother    Wt Readings from Last 3 Encounters:  01/23/24 73.3 kg (161 lb 8 oz)  01/17/24 71.5 kg (157 lb 9.6 oz)  12/12/23 65.3 kg (144 lb)  BP 96/71 Comment: 79  Pulse 76   Wt 73.3 kg (161 lb 8 oz)   SpO2 100%   BMI 21.90 kg/m   PHYSICAL EXAM: General: NAD Neck: JVP 14 cm, no thyromegaly or thyroid  nodule.  Lungs: Decreased BS right base. CV: Nondisplaced PMI.  Heart regular S1/S2, no S3/S4, no murmur.  2+ edema to knees.  No carotid bruit.  Normal pedal pulses.  Abdomen: Soft, nontender, no hepatosplenomegaly, no distention.  Skin: Intact without lesions or rashes.  Neurologic: Alert and oriented x 3.  Psych: Normal affect. Extremities: No clubbing or cyanosis.  HEENT: Normal.   ASSESSMENT & PLAN: 1. Chronic systolic CHF/ischemic cardiomyopathy:  Biotronik ICD.  Echo 12/24 with EF 20-25%, no LV thrombus, AK mid septum to apex, moderately reduced RV systolic function. Echo 2/25 with EF 25%, mildly reduced RV function.  Low BP has limited GDMT titration.  His profound fatigue has been concerning for low output HF. RHC 3/25 showed low CI by thermo (2.05) but preserved by Fick.  Borderline elevated PCWP, normal RA pressure.  There was also moderate mixed  pulmonary arterial/pulmonary venous hypertension.  With his significant symptoms, he was started on milrinone  0.25 and added sildenafil  to try to  lower PA pressure.  Milrinone  was later weaned off with medication adjustment, and co-ox remained excellent.  He was readmitted in 6/25 with CHF; RHC showed moderate mixed pulmonary arterial/pulmonary venous hypertension with CI low at 2.1 by Fick and thermo.  Echo in 6/25 showed EF 20-25%, mild LV dilation, moderate RV dysfunction, normal RV size, PASP 44 mmHg, mild-moderate MR.  We have had to peel back on GDMT due to low BP and he is now taking midodrine .  With low output heart failure (CI 2.1 on last RHC and inability to tolerate GDMT - now on midodrine ), we have been discussing advanced therapies.  He is not interested in transplant and at his age, it would be more difficult to obtain. I think he would be a good LVAD candidate. We have a window at this time while his RV function is not severely depressed and his renal function is relatively preserved.  He has completed workup.  We had him set up for LVAD surgery, but he decided that he wanted to wait longer (daughter getting married).  At last appointment, he had gained 17 lbs and was significantly volume overloaded with NYHA class III symptoms.  I gave him Furoscix  over the weekend and increased his torsemide  after that, but today he is up another 4 lbs.  He remains quite volume overloaded and orthopnea is worse.  Unfortunately, he appears to have failed outpatient diuresis.  I remain concerned that we miss his window for LVAD implantation.  - He will continue torsemide  60 mg po bid today.  Check BMET/BNP today.  - I will bring him in for RHC tomorrow. If output is low, I will start him on milrinone  gtt.  I will plan to admit him regardless for diuresis.  - Continue Jardiance  10 mg daily. - Continue digoxin  0.125 daily, check digoxin  level today.  - He is on midodrine  5 mg tid to maintain MAP.  SBP remain in  90s, he denies lightheadedness.   - off Toprol  XL with low output.  - Off spironolactone  with low BP.  - Continue Verquvo  5 mg daily.  - We again discussed LVAD, his daughter is here today.  I would like to optimize his volume status and then get LVAD surgery arranged.  He still seems on the fence about the device.  As above, I am going to bring him in for RHC tomorrow and likely admission for milrinone /diuresis.  We will need to talk more about LVAD.  As above, I am concerned that we could miss our window of opportunity for placement.  2. CAD:  Hx inferior STEMI 10/23 s/p PCI/DES to RCA. Anterior STEMI 12/10/22, cath showed occluded pLAD treated with PCI/DES, prior RCA stent patent, severe disease OM1 and distal LCx treated medically. No chest pain.  - Continue Plavix  => will need to hold this if he agrees to LVAD.   - No ASA given Eliquis  use.  - Continue rosuvastatin  40 mg + Zetia  10 mg daily.  LDL above goal < 55 in 11/25, would ideally get on Repatha but for now will focus on preparation for LVAD.   3. Atrial fibrillation: Paroxysmal. Has been in NSR.  - continue Eliquis  5 mg bid  - Continue amiodarone  100 mg daily, recent LFTs and TSH normal.  He will need a regular eye exam.  4. DM II: Continue Jardiance   5. 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 Not a smoker, no known lung disease. CXR has shown some chronic interstitial changes. HRCT  chest with bibasilar bronchiectasis that may be due to aspiration (though swallow study negative).  V/Q scan showed no chronic PE. Rheumatologic serologies negative - Continue tadalafil  20 mg daily.   6. Pleural effusion: S/p right thoracentesis 3/25 and again in 11/25. Pleural fluid was transudative by Light's criteria, no malignant cells. By exam today, I suspect he has reaccumulated pleural fluid.   - CXR if admitted tomorrow, may need repeat thoracentesis.  7. GI: History of early satiety, GI discomfort.   CT abdomen/pelvis with gastric wall thickening. GI was consulted and EGD was performed, no definite evidence for malignancy, biopsies taken for H pylori testing and pathology => negative for H pylori and no gastric cancer.  Gastric emptying study normal in 9/25.  Abdominal US  showed normal appearing liver in 9/25.  Virtual colonoscopy unremarkable.  He actually is feeling better with improved appetite.  8. 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  - Continue Levoxyl , recent TSH ok.  9. Fe deficiency anemia: He has had IV FE.   I spent 43 minutes reviewing records, interviewing/examining patient, and managing orders. Patient's daughter was present.   RHC tomorrow and likely admission.    Ezra Shuck 01/23/2024   "

## 2024-01-24 ENCOUNTER — Encounter (HOSPITAL_COMMUNITY): Payer: Self-pay | Admitting: Cardiology

## 2024-01-24 ENCOUNTER — Inpatient Hospital Stay (HOSPITAL_COMMUNITY)

## 2024-01-24 ENCOUNTER — Other Ambulatory Visit: Payer: Self-pay

## 2024-01-24 ENCOUNTER — Encounter (HOSPITAL_COMMUNITY): Admission: RE | Disposition: A | Payer: Self-pay | Source: Home / Self Care | Attending: Internal Medicine

## 2024-01-24 ENCOUNTER — Inpatient Hospital Stay (HOSPITAL_COMMUNITY)
Admission: RE | Admit: 2024-01-24 | Discharge: 2024-01-31 | DRG: 286 | Disposition: A | Discharge status: Home / Self Care | Attending: Cardiology | Admitting: Cardiology

## 2024-01-24 DIAGNOSIS — Z79890 Hormone replacement therapy (postmenopausal): Secondary | ICD-10-CM

## 2024-01-24 DIAGNOSIS — Z8249 Family history of ischemic heart disease and other diseases of the circulatory system: Secondary | ICD-10-CM | POA: Diagnosis not present

## 2024-01-24 DIAGNOSIS — D509 Iron deficiency anemia, unspecified: Secondary | ICD-10-CM | POA: Diagnosis present

## 2024-01-24 DIAGNOSIS — Z7984 Long term (current) use of oral hypoglycemic drugs: Secondary | ICD-10-CM

## 2024-01-24 DIAGNOSIS — E039 Hypothyroidism, unspecified: Secondary | ICD-10-CM | POA: Diagnosis present

## 2024-01-24 DIAGNOSIS — I5021 Acute systolic (congestive) heart failure: Secondary | ICD-10-CM

## 2024-01-24 DIAGNOSIS — Z955 Presence of coronary angioplasty implant and graft: Secondary | ICD-10-CM

## 2024-01-24 DIAGNOSIS — I272 Pulmonary hypertension, unspecified: Secondary | ICD-10-CM | POA: Diagnosis not present

## 2024-01-24 DIAGNOSIS — Z681 Body mass index (BMI) 19 or less, adult: Secondary | ICD-10-CM | POA: Diagnosis not present

## 2024-01-24 DIAGNOSIS — Z79899 Other long term (current) drug therapy: Secondary | ICD-10-CM | POA: Diagnosis not present

## 2024-01-24 DIAGNOSIS — Z66 Do not resuscitate: Secondary | ICD-10-CM | POA: Diagnosis present

## 2024-01-24 DIAGNOSIS — I2721 Secondary pulmonary arterial hypertension: Secondary | ICD-10-CM | POA: Diagnosis present

## 2024-01-24 DIAGNOSIS — I5020 Unspecified systolic (congestive) heart failure: Secondary | ICD-10-CM | POA: Diagnosis not present

## 2024-01-24 DIAGNOSIS — I2510 Atherosclerotic heart disease of native coronary artery without angina pectoris: Secondary | ICD-10-CM | POA: Diagnosis present

## 2024-01-24 DIAGNOSIS — I5023 Acute on chronic systolic (congestive) heart failure: Secondary | ICD-10-CM | POA: Diagnosis present

## 2024-01-24 DIAGNOSIS — I480 Paroxysmal atrial fibrillation: Secondary | ICD-10-CM | POA: Diagnosis present

## 2024-01-24 DIAGNOSIS — I5084 End stage heart failure: Secondary | ICD-10-CM | POA: Diagnosis present

## 2024-01-24 DIAGNOSIS — R570 Cardiogenic shock: Secondary | ICD-10-CM | POA: Diagnosis not present

## 2024-01-24 DIAGNOSIS — I255 Ischemic cardiomyopathy: Secondary | ICD-10-CM | POA: Diagnosis present

## 2024-01-24 DIAGNOSIS — Z95810 Presence of automatic (implantable) cardiac defibrillator: Secondary | ICD-10-CM

## 2024-01-24 DIAGNOSIS — Z823 Family history of stroke: Secondary | ICD-10-CM

## 2024-01-24 DIAGNOSIS — Z7901 Long term (current) use of anticoagulants: Secondary | ICD-10-CM

## 2024-01-24 DIAGNOSIS — E785 Hyperlipidemia, unspecified: Secondary | ICD-10-CM | POA: Diagnosis present

## 2024-01-24 DIAGNOSIS — E43 Unspecified severe protein-calorie malnutrition: Secondary | ICD-10-CM | POA: Diagnosis present

## 2024-01-24 DIAGNOSIS — I130 Hypertensive heart and chronic kidney disease with heart failure and stage 1 through stage 4 chronic kidney disease, or unspecified chronic kidney disease: Secondary | ICD-10-CM | POA: Diagnosis present

## 2024-01-24 DIAGNOSIS — I509 Heart failure, unspecified: Secondary | ICD-10-CM | POA: Diagnosis present

## 2024-01-24 DIAGNOSIS — E1122 Type 2 diabetes mellitus with diabetic chronic kidney disease: Secondary | ICD-10-CM | POA: Diagnosis present

## 2024-01-24 DIAGNOSIS — Z833 Family history of diabetes mellitus: Secondary | ICD-10-CM | POA: Diagnosis not present

## 2024-01-24 DIAGNOSIS — I5022 Chronic systolic (congestive) heart failure: Secondary | ICD-10-CM

## 2024-01-24 DIAGNOSIS — Z7902 Long term (current) use of antithrombotics/antiplatelets: Secondary | ICD-10-CM

## 2024-01-24 DIAGNOSIS — Z91014 Allergy to mammalian meats: Secondary | ICD-10-CM

## 2024-01-24 DIAGNOSIS — R54 Age-related physical debility: Secondary | ICD-10-CM | POA: Diagnosis present

## 2024-01-24 DIAGNOSIS — I252 Old myocardial infarction: Secondary | ICD-10-CM

## 2024-01-24 DIAGNOSIS — Z8419 Family history of other disorders of kidney and ureter: Secondary | ICD-10-CM

## 2024-01-24 DIAGNOSIS — N182 Chronic kidney disease, stage 2 (mild): Secondary | ICD-10-CM | POA: Diagnosis present

## 2024-01-24 DIAGNOSIS — T462X5A Adverse effect of other antidysrhythmic drugs, initial encounter: Secondary | ICD-10-CM | POA: Diagnosis present

## 2024-01-24 HISTORY — DX: Hypothyroidism, unspecified: E03.9

## 2024-01-24 HISTORY — PX: RIGHT HEART CATH: CATH118263

## 2024-01-24 LAB — MRSA NEXT GEN BY PCR, NASAL: MRSA by PCR Next Gen: NOT DETECTED

## 2024-01-24 LAB — POCT I-STAT EG7
Acid-Base Excess: 3 mmol/L — ABNORMAL HIGH (ref 0.0–2.0)
Bicarbonate: 27.6 mmol/L (ref 20.0–28.0)
Calcium, Ion: 1.2 mmol/L (ref 1.15–1.40)
HCT: 38 % — ABNORMAL LOW (ref 39.0–52.0)
Hemoglobin: 12.9 g/dL — ABNORMAL LOW (ref 13.0–17.0)
O2 Saturation: 56 %
Potassium: 3.5 mmol/L (ref 3.5–5.1)
Sodium: 140 mmol/L (ref 135–145)
TCO2: 29 mmol/L (ref 22–32)
pCO2, Ven: 41.8 mmHg — ABNORMAL LOW (ref 44–60)
pH, Ven: 7.429 (ref 7.25–7.43)
pO2, Ven: 29 mmHg — CL (ref 32–45)

## 2024-01-24 LAB — ECHOCARDIOGRAM COMPLETE
Calc EF: 23.7 %
Height: 72 in
S' Lateral: 4.9 cm
Single Plane A2C EF: 14.7 %
Single Plane A4C EF: 31 %
Weight: 2400 [oz_av]

## 2024-01-24 LAB — BASIC METABOLIC PANEL WITH GFR
Anion gap: 11 (ref 5–15)
BUN: 29 mg/dL — ABNORMAL HIGH (ref 8–23)
CO2: 28 mmol/L (ref 22–32)
Calcium: 9.3 mg/dL (ref 8.9–10.3)
Chloride: 100 mmol/L (ref 98–111)
Creatinine, Ser: 1.34 mg/dL — ABNORMAL HIGH (ref 0.61–1.24)
GFR, Estimated: 58 mL/min — ABNORMAL LOW
Glucose, Bld: 158 mg/dL — ABNORMAL HIGH (ref 70–99)
Potassium: 4.5 mmol/L (ref 3.5–5.1)
Sodium: 139 mmol/L (ref 135–145)

## 2024-01-24 LAB — GLUCOSE, CAPILLARY
Glucose-Capillary: 149 mg/dL — ABNORMAL HIGH (ref 70–99)
Glucose-Capillary: 180 mg/dL — ABNORMAL HIGH (ref 70–99)

## 2024-01-24 LAB — HEPARIN LEVEL (UNFRACTIONATED): Heparin Unfractionated: 0.83 [IU]/mL — ABNORMAL HIGH (ref 0.30–0.70)

## 2024-01-24 LAB — APTT: aPTT: 60 s — ABNORMAL HIGH (ref 24–36)

## 2024-01-24 MED ORDER — VERICIGUAT 5 MG PO TABS: 5.0000 mg | ORAL_TABLET | Freq: Every day | ORAL | Status: DC

## 2024-01-24 MED ORDER — ONDANSETRON HCL 4 MG/2ML IJ SOLN: 4.0000 mg | Freq: Four times a day (QID) | INTRAMUSCULAR | Status: DC | PRN

## 2024-01-24 MED ORDER — MILRINONE LACTATE IN DEXTROSE 20-5 MG/100ML-% IV SOLN
0.3750 ug/kg/min | INTRAVENOUS | Status: DC
  Administered 2024-01-24: 0.25 ug/kg/min via INTRAVENOUS
  Administered 2024-01-24 – 2024-01-25 (×3): 0.375 ug/kg/min via INTRAVENOUS
  Filled 2024-01-24 (×3): qty 100

## 2024-01-24 MED ORDER — SODIUM CHLORIDE 0.9% FLUSH: 3.0000 mL | INTRAVENOUS | Status: DC | PRN

## 2024-01-24 MED ORDER — TADALAFIL 20 MG PO TABS
20.0000 mg | ORAL_TABLET | Freq: Every day | ORAL | Status: DC
  Administered 2024-01-24 – 2024-01-31 (×8): 20 mg via ORAL
  Filled 2024-01-24 (×8): qty 1

## 2024-01-24 MED ORDER — FENTANYL CITRATE (PF) 100 MCG/2ML IJ SOLN
INTRAMUSCULAR | Status: DC | PRN
  Administered 2024-01-24: 25 ug via INTRAVENOUS

## 2024-01-24 MED ORDER — FENTANYL CITRATE (PF) 100 MCG/2ML IJ SOLN
INTRAMUSCULAR | Status: AC
  Filled 2024-01-24: qty 2

## 2024-01-24 MED ORDER — SODIUM CHLORIDE 0.9% IV SOLUTION: INTRAVENOUS | Status: DC

## 2024-01-24 MED ORDER — EMPAGLIFLOZIN 25 MG PO TABS
25.0000 mg | ORAL_TABLET | Freq: Every day | ORAL | Status: DC
  Administered 2024-01-24 – 2024-01-28 (×5): 25 mg via ORAL
  Filled 2024-01-24 (×6): qty 1

## 2024-01-24 MED ORDER — SODIUM CHLORIDE 0.9% FLUSH
3.0000 mL | Freq: Two times a day (BID) | INTRAVENOUS | Status: DC
  Administered 2024-01-24: 3 mL via INTRAVENOUS

## 2024-01-24 MED ORDER — FREE WATER
250.0000 mL | Freq: Once | Status: AC
  Administered 2024-01-24: 250 mL via ORAL

## 2024-01-24 MED ORDER — FUROSEMIDE 10 MG/ML IJ SOLN
80.0000 mg | Freq: Once | INTRAMUSCULAR | Status: AC
  Administered 2024-01-24: 80 mg via INTRAVENOUS

## 2024-01-24 MED ORDER — ACETAMINOPHEN 325 MG PO TABS: 650.0000 mg | ORAL_TABLET | ORAL | Status: DC | PRN

## 2024-01-24 MED ORDER — MIDAZOLAM HCL 2 MG/2ML IJ SOLN
INTRAMUSCULAR | Status: AC
  Filled 2024-01-24: qty 2

## 2024-01-24 MED ORDER — SODIUM CHLORIDE 0.9% FLUSH
3.0000 mL | Freq: Two times a day (BID) | INTRAVENOUS | Status: DC
  Administered 2024-01-24 – 2024-01-31 (×15): 3 mL via INTRAVENOUS

## 2024-01-24 MED ORDER — ROSUVASTATIN CALCIUM 20 MG PO TABS
40.0000 mg | ORAL_TABLET | Freq: Every day | ORAL | Status: DC
  Administered 2024-01-24 – 2024-01-31 (×8): 40 mg via ORAL
  Filled 2024-01-24 (×8): qty 2

## 2024-01-24 MED ORDER — LIDOCAINE HCL (PF) 1 % IJ SOLN
INTRAMUSCULAR | Status: DC | PRN
  Administered 2024-01-24: 5 mL

## 2024-01-24 MED ORDER — AMIODARONE HCL 200 MG PO TABS
100.0000 mg | ORAL_TABLET | Freq: Every day | ORAL | Status: DC
  Administered 2024-01-24 – 2024-01-31 (×8): 100 mg via ORAL
  Filled 2024-01-24 (×8): qty 1

## 2024-01-24 MED ORDER — ACETAMINOPHEN 325 MG PO TABS: 325.0000 mg | ORAL_TABLET | ORAL | Status: DC | PRN

## 2024-01-24 MED ORDER — FUROSEMIDE 10 MG/ML IJ SOLN
INTRAMUSCULAR | Status: AC
  Filled 2024-01-24: qty 8

## 2024-01-24 MED ORDER — EZETIMIBE 10 MG PO TABS
10.0000 mg | ORAL_TABLET | Freq: Every day | ORAL | Status: DC
  Administered 2024-01-24 – 2024-01-31 (×8): 10 mg via ORAL
  Filled 2024-01-24 (×8): qty 1

## 2024-01-24 MED ORDER — MILRINONE LACTATE IN DEXTROSE 20-5 MG/100ML-% IV SOLN
INTRAVENOUS | Status: AC
  Filled 2024-01-24: qty 100

## 2024-01-24 MED ORDER — CHLORHEXIDINE GLUCONATE CLOTH 2 % EX PADS
6.0000 | MEDICATED_PAD | Freq: Every day | CUTANEOUS | Status: DC
  Administered 2024-01-24 – 2024-01-31 (×8): 6 via TOPICAL

## 2024-01-24 MED ORDER — PANTOPRAZOLE SODIUM 40 MG PO TBEC
40.0000 mg | DELAYED_RELEASE_TABLET | Freq: Two times a day (BID) | ORAL | Status: DC
  Administered 2024-01-24 – 2024-01-31 (×14): 40 mg via ORAL
  Filled 2024-01-24 (×15): qty 1

## 2024-01-24 MED ORDER — SODIUM CHLORIDE 0.9 % IV SOLN: 250.0000 mL | INTRAVENOUS | Status: AC | PRN

## 2024-01-24 MED ORDER — MIDAZOLAM HCL (PF) 2 MG/2ML IJ SOLN
INTRAMUSCULAR | Status: DC | PRN
  Administered 2024-01-24: 1 mg via INTRAVENOUS

## 2024-01-24 MED ORDER — HEPARIN (PORCINE) IN NACL 1000-0.9 UT/500ML-% IV SOLN
INTRAVENOUS | Status: DC | PRN
  Administered 2024-01-24: 500 mL via SURGICAL_CAVITY

## 2024-01-24 MED ORDER — FUROSEMIDE 10 MG/ML IJ SOLN
12.0000 mg/h | INTRAVENOUS | Status: DC
  Administered 2024-01-24 – 2024-01-25 (×2): 12 mg/h via INTRAVENOUS
  Filled 2024-01-24 (×2): qty 200

## 2024-01-24 MED ORDER — SODIUM CHLORIDE 0.9 % IV SOLN: 250.0000 mL | INTRAVENOUS | Status: DC | PRN

## 2024-01-24 MED ORDER — SODIUM CHLORIDE 0.9 % IV SOLN: INTRAVENOUS | Status: AC

## 2024-01-24 MED ORDER — MIDODRINE HCL 5 MG PO TABS
5.0000 mg | ORAL_TABLET | Freq: Three times a day (TID) | ORAL | Status: DC
  Administered 2024-01-24 – 2024-01-25 (×3): 5 mg via ORAL
  Filled 2024-01-24 (×3): qty 1

## 2024-01-24 MED ORDER — DIGOXIN 125 MCG PO TABS
0.1250 mg | ORAL_TABLET | Freq: Every day | ORAL | Status: DC
  Administered 2024-01-24 – 2024-01-31 (×8): 0.125 mg via ORAL
  Filled 2024-01-24 (×8): qty 1

## 2024-01-24 MED ORDER — LEVOTHYROXINE SODIUM 112 MCG PO TABS
112.0000 ug | ORAL_TABLET | Freq: Every day | ORAL | Status: DC
  Administered 2024-01-24 – 2024-01-31 (×8): 112 ug via ORAL
  Filled 2024-01-24 (×8): qty 1

## 2024-01-24 MED ORDER — SODIUM CHLORIDE 0.9% IV SOLUTION: INTRAVENOUS | Status: DC | PRN

## 2024-01-24 MED ORDER — HEPARIN (PORCINE) 25000 UT/250ML-% IV SOLN
1000.0000 [IU]/h | INTRAVENOUS | Status: DC
  Administered 2024-01-24: 950 [IU]/h via INTRAVENOUS
  Administered 2024-01-25: 1050 [IU]/h via INTRAVENOUS
  Administered 2024-01-26 – 2024-01-30 (×4): 1000 [IU]/h via INTRAVENOUS
  Filled 2024-01-24 (×8): qty 250

## 2024-01-24 MED ORDER — ASPIRIN 81 MG PO CHEW: 81.0000 mg | CHEWABLE_TABLET | ORAL | Status: DC

## 2024-01-24 NOTE — Progress Notes (Signed)
 Heart Failure Navigator Progress Note  Assessed for Heart & Vascular TOC clinic readiness.  Patient does not meet criteria due to he is an Advanced Heart Failure Team patient of Dr. Rolan. .   Navigator will sign off at this time.   Stephane Haddock, BSN, Scientist, clinical (histocompatibility and immunogenetics) Only

## 2024-01-24 NOTE — Interval H&P Note (Signed)
 History and Physical Interval Note:  01/24/2024 8:48 AM  Garrett Roberts  has presented today for surgery, with the diagnosis of heart failure.  The various methods of treatment have been discussed with the patient and family. After consideration of risks, benefits and other options for treatment, the patient has consented to  Procedures: RIGHT HEART CATH (N/A) as a surgical intervention.  The patient's history has been reviewed, patient examined, no change in status, stable for surgery.  I have reviewed the patient's chart and labs.  Questions were answered to the patient's satisfaction.     Demetre Monaco [employer]

## 2024-01-24 NOTE — Progress Notes (Signed)
" °  Echocardiogram 2D Echocardiogram has been performed.  Refael Fulop 01/24/2024, 5:31 PM "

## 2024-01-24 NOTE — Consult Note (Signed)
 "                                                                                   Consultation Note Date: 01/24/2024   Patient Name: Garrett Roberts  DOB: 1955/08/26  MRN: [id number]  Age / Sex: 69 y.o., male  PCP: Vincente Shivers, NP Referring Physician: Cherrie Toribio SAUNDERS, MD  Reason for Consultation: VAD eval  HPI/Patient Profile: 69 y.o. male  with past medical history of HFrEF EF 20-25%, ICM, CAD, inferior STEMI 10/2021 s/p DES to RCA, anterior STEMI 11/2022 s/p DES to LAD, CKD stage 2, DM2, HTN, HLD, s/p Biotronik ICD, s/p right thoracentesis  11/25 1.1L admitted on 01/24/2024 with acute on chronic heart failure exacerbation with fluid overload, orthopnea, cough. Admitted to ICU and Swan placed. S/P RHC 1/14. Palliative care requested for ongoing VAAD evaluation.   Clinical Assessment and Goals of Care: Consult received and chart review completed. I am familiar with Garrett Roberts from VAD eval conversation June 2025. Daughter is at bedside and we were later in the conversation joined by his brother and then another daughter. I reviewed with Garrett Roberts VAD and why this is coming up again. We discussed purpose of VAD, how VAD works, and recovery period. Both he and family have many insightful questions about this process and care of VAD.   I spent much time exploring Mr. Alonzo thoughts on VAD and why he would desire or not desire VAD. He is very clear that he is at peace with dying and feels that he has accomplished what he needed to in his life. His priority is not more time but quality of life. He is hesitant to go through VAD with no guarantee this will give him improved quality of life. He endorses that he still has good quality of life. He is not afraid of dying. During our deliberation of his wishes he ultimately does admit that he does not really want to proceed with VAD. He does not give a solid no but does not want to be pushed into a decision. He does understand that if he continues to  wait too long that VAD may not be an for him at a later date. He is okay if he misses the window for VAD implant because he is not afraid to die.   Daughter at bedside shares that they have had discussions as a family over time. He has consistently told them at home he does not want VAD. The one factor that could change his mind would be if VAD batteries could be charged on a converter. I doubt this would be If so, he would likely be open to VAD with a goal to visit his mother in [place]. This is very important to him. Daughter shares that if this can be possible family would be supportive of VAD if he agrees. They are following his wishes and do not want to push him into a VAD if he does not want this. Brother at bedside wishes he would proceed with VAD.   Garrett Roberts becomes tired and ECHO here. I left him to rest and we agree to speak more tomorrow.   Primary Decision  Maker PATIENT    SUMMARY OF RECOMMENDATIONS   - Ongoing discussions regarding desire for VAD and goals of care  Code Status/Advance Care Planning: Full code - will need to discuss further especially if he does not desire VAD  Symptom Management:  Per heart failure team  Prognosis:  Overall poor with advanced heart failure and if no desire for VAD  Discharge Planning: To Be Determined      Primary Diagnoses: Present on Admission: **None**   I have reviewed the medical record, interviewed the patient and family, and examined the patient. The following aspects are pertinent.  Past Medical History:  Diagnosis Date   Acute ST elevation myocardial infarction (STEMI) of anterior wall (HCC) 12/10/2022   Acute ST elevation myocardial infarction (STEMI) of inferior wall (HCC) 10/30/2021   Atrial fibrillation (HCC)    Atrial fibrillation with RVR (HCC) 12/19/2022   CHF (congestive heart failure) (HCC)    Coronary artery disease    Diabetes mellitus without complication (HCC)    DM (diabetes mellitus) (HCC)    H. pylori  infection    Heart attack (HCC)    HLD (hyperlipidemia)    Hypertension    ST elevation myocardial infarction involving left anterior descending (LAD) coronary artery (HCC) 12/10/2022   Social History   Socioeconomic History   Marital status: Married    Spouse name: Amani   Number of children: 5   Years of education: Not on file   Highest education level: Bachelor's degree (e.g., BA, AB, BS)  Occupational History   Occupation: part time  Tobacco Use   Smoking status: Never   Smokeless tobacco: Never  Vaping Use   Vaping status: Never Used  Substance and Sexual Activity   Alcohol use: No   Drug use: No   Sexual activity: Not on file  Other Topics Concern   Not on file  Social History Narrative   ** Merged History Encounter **       Social Drivers of Health   Tobacco Use: Low Risk (12/12/2023)   Patient History    Smoking Tobacco Use: Never    Smokeless Tobacco Use: Never    Passive Exposure: Not on file  Financial Resource Strain: High Risk (12/08/2023)   Overall Financial Resource Strain (CARDIA)    Difficulty of Paying Living Expenses: Hard  Food Insecurity: Food Insecurity Present (12/08/2023)   Epic    Worried About Programme Researcher, Broadcasting/film/video in the Last Year: Sometimes true    Ran Out of Food in the Last Year: Sometimes true  Transportation Needs: No Transportation Needs (12/08/2023)   Epic    Lack of Transportation (Medical): No    Lack of Transportation (Non-Medical): No  Physical Activity: Insufficiently Active (12/08/2023)   Exercise Vital Sign    Days of Exercise per Week: 2 days    Minutes of Exercise per Session: 40 min  Stress: No Stress Concern Present (12/08/2023)   [employer] of Occupational Health - Occupational Stress Questionnaire    Feeling of Stress: Not at all  Social Connections: Moderately Integrated (12/08/2023)   Social Connection and Isolation Panel    Frequency of Communication with Friends and Family: More than three times a week     Frequency of Social Gatherings with Friends and Family: More than three times a week    Attends Religious Services: 1 to 4 times per year    Active Member of [employer] or Organizations: No    Attends Banker Meetings: Not on file  Marital Status: Married  Depression (PHQ2-9): Low Risk (09/01/2023)   Depression (PHQ2-9)    PHQ-2 Score: 0  Recent Concern: Depression (PHQ2-9) - Medium Risk (07/05/2023)   Depression (PHQ2-9)    PHQ-2 Score: 10  Alcohol Screen: Low Risk (07/12/2023)   Alcohol Screen    Last Alcohol Screening Score (AUDIT): 0  Housing: Low Risk (12/08/2023)   Epic    Unable to Pay for Housing in the Last Year: No    Number of Times Moved in the Last Year: 0    Homeless in the Last Year: No  Utilities: Not At Risk (07/12/2023)   Epic    Threatened with loss of utilities: No  Health Literacy: Adequate Health Literacy (07/12/2023)   B1300 Health Literacy    Frequency of need for help with medical instructions: Never   Family History  Problem Relation Age of Onset   Diabetes Mother    Chronic Renal Failure Father    Diabetes Father    Chronic Renal Failure Brother    Diabetes Brother    Diabetes Brother    Heart attack Brother    Stroke Brother    Scheduled Meds:  amiodarone   100 mg Oral Daily   Chlorhexidine  Gluconate Cloth  6 each Topical Daily   digoxin   0.125 mg Oral Daily   empagliflozin   25 mg Oral Daily   ezetimibe   10 mg Oral Daily   levothyroxine   112 mcg Oral Daily   midodrine   5 mg Oral TID WC   pantoprazole   40 mg Oral BID AC   rosuvastatin   40 mg Oral Daily   sodium chloride  flush  3 mL Intravenous Q12H   tadalafil   20 mg Oral Daily   Vericiguat   5 mg Oral Daily   Continuous Infusions:  sodium chloride  10 mL/hr at 01/24/24 1518   sodium chloride      sodium chloride  10 mL/hr at 01/24/24 1500   sodium chloride  10 mL/hr at 01/24/24 1518   furosemide  (LASIX ) 200 mg in dextrose  5 % 100 mL (2 mg/mL) infusion 12 mg/hr (01/24/24 1500)   heparin   950 Units/hr (01/24/24 1512)   milrinone  0.375 mcg/kg/min (01/24/24 1520)   PRN Meds:.sodium chloride , sodium chloride , acetaminophen , ondansetron  (ZOFRAN ) IV, sodium chloride  flush Allergies[1] Review of Systems  Constitutional:  Positive for activity change.  Respiratory:  Positive for shortness of breath.     Physical Exam Vitals and nursing note reviewed.  Constitutional:      General: He is not in acute distress.    Appearance: He is ill-appearing.  Cardiovascular:     Rate and Rhythm: Normal rate.     Comments: R Swan Pulmonary:     Effort: No tachypnea, accessory muscle usage or respiratory distress.  Abdominal:     General: Abdomen is flat.  Neurological:     Mental Status: He is alert and oriented to person, place, and time.     Vital Signs: BP 98/71   Pulse 79   Temp (!) 97 F (36.1 C)   Resp 20   Ht 6' (1.829 m)   Wt 68 kg   SpO2 97%   BMI 20.34 kg/m  Pain Scale: 0-10 POSS *See Group Information*: 1-Acceptable,Awake and alert Pain Score: 0-No pain   SpO2: SpO2: 97 % O2 Device:SpO2: 97 % O2 Flow Rate: .O2 Flow Rate (L/min): 2 L/min  IO: Intake/output summary:  Intake/Output Summary (Last 24 hours) at 01/24/2024 1713 Last data filed at 01/24/2024 1520 Gross per 24 hour  Intake 295.68  ml  Output 2000 ml  Net -1704.32 ml    LBM: Last BM Date :  (PTA) Baseline Weight: Weight: 68 kg Most recent weight: Weight: 68 kg       Time Total: 80 min  Greater than 50%  of this time was spent counseling and coordinating care related to the above assessment and plan.  Signed by: Bernarda Kitty, NP Palliative Medicine Team Pager # [phone] (M-F 8a-5p) Team Phone # [phone] (Nights/Weekends)                     [1]  Allergies Allergen Reactions   Bovine (Beef) Protein-Containing Drug Products    Porcine (Pork) Protein-Containing Drug Products Other (See Comments)    Religious observance   "

## 2024-01-24 NOTE — H&P (Addendum)
 "   Advanced Heart Failure Team History and Physical Note   PCP:  Vincente Shivers, NP  PCP-Cardiology: [name], MD     Reason for Admission: CHF exacerbation HPI:    69 y.o. with history of CAD, DM II, HTN, HLD, ischemic cardiomyopathy/chronic systolic CHF.    Patient had inferior STEMI 10/23 s/p PCI/DES to RCA. Had residual diffuse disease in diagonals and OMs. EF 40-45% at time of cath. Subsequent echo in 10/23 with EF 55-60%.    He was not taking medications in 2024 after losing insurance. Presented with anterior STEMI 12/10/22. Cardiac cath with 100% proximal LAD treated with PCI/DES. Prior RCA stent patent, severe disease in tortuous ramus and distal LCx treated medically.  Echo with EF 25%, no LV thrombus, AK mid septum to apex, moderately reduced RV systolic function. Course complicated by atrial fibrillation with RVR. Started on IV amiodarone  and underwent DCCV to SR.  GDMT limited by soft blood pressure. Also seen by GI for abdominal pain. Had thickening of stomach on CT scan. H. Pylori later resulted positive after discharge, seen by GI and started on treatment for H pylori-related gastritis.    Returned to ED 12/19/22 with recurrent atrial fibrillation. He converted to SR while in waiting room and was discharged home.   Admitted 2/25 with acute CHF. Diuresed with IV lasix . GDMT cut back 2/2 to low BP, Entresto  and spironolactone  stopped. He was discharged home, weight 154 lbs.  Echo in 2/25 showed EF 25% with mild RV dysfunction and mild RV enlargement, PASP 60 mmHg, moderate MR.    He was seen again in the ER 03/14/23 with worsening fatigue and dyspnea.  Torsemide  was increased to 40 qam/20 qpm.    Patient was set up for RHC in 3/25 due to ongoing severe fatigue. This showed low CI 2.02 by thermodilution as well as moderate pulmonary arterial hypertension, he was admitted and started on milrinone  gtt.  He was gradually titrated off milrinone  and maintained a good co-ox off  milrinone .  He had a moderate right pleural effusion, underwent right thoracentesis.  Fluid was transudative but grew E coli, he was treated with Bactrim  based on sensitivities.  Given early satiety and poor appetite, he had an EGD that showed gastritic, biopsies showed no H pylori or gastric cancer.  HRCT chest showed bibasilar bronchiectasis, ?aspiration. However, swallow study was not suggestive of aspiration. V/Q scan was done due to pulmonary hypertension, this showed no chronic PE.  He was started on sildenafil  and meds were titrated, he felt better when he went home.    Echo in 3/25 showed EF 25-30%, mild RV dysfunction, moderate MR, PASP 62 mmHg.    Biotronik ICD placed in 5/25.   Patient was admitted again in 6/25 with dyspnea and fatigue.  BP was low, midodrine  was started and spironolactone  and tadalafil  were stopped.  RHC again showed mixed pulmonary arterial/venous hypertension and low CI at 2.1 by Fick and thermodilution.  Echo showed EF 20-25%, mild LV dilation, moderate RV dysfunction, normal RV size, PASP 44 mmHg, mild-moderate MR.     Patient had right thoracentesis in 11/25, 1.1 L out, cytology negative.      Patient returned to the office yesterday for followup of CHF. Despite use of Furoscix  x 4 days and increase in torsemide , weight was up 4 lbs.  He did not feel like he urinated well with Furoscix .  He is still orthopneic and cannot lie flat.  He has a worsening cough.  Dyspneic with stairs inclines, still doing ok on flat ground.  No lightheadedness, no chest pain.   He was set up for RHC today, numbers as below.  Norva was left in place and patient was admitted for diuresis and milrinone  gtt, will need ongoing discussion regarding advanced therapies.   RHC Procedural Findings: Hemodynamics (mmHg) RA mean 13 RV 45/11 PA 39/18, mean 26 PCWP mean 20 Oxygen saturations: PA 56% AO 95% Cardiac Output (Fick) 3.3  Cardiac Index (Fick) 1.75 PVR 1.8 WU  Cardiac Output (Thermo)  2.81  Cardiac Index (Thermo) 1.49  PVR 2.1 WU PAPi 1.6    Home Medications Prior to Admission medications  Medication Sig Start Date End Date Taking? Authorizing Provider  acetaminophen  (TYLENOL ) 325 MG tablet Take 1-2 tablets (325-650 mg total) by mouth every 4 (four) hours as needed for mild pain (pain score 1-3). 06/02/23  Yes Lesia Ozell Barter, PA-C  amiodarone  (PACERONE ) 200 MG tablet Take 1/2 tablet (100 mg total) by mouth daily. 08/25/23  Yes Rolan Ezra RAMAN, MD  apixaban  (ELIQUIS ) 5 MG TABS tablet Take 1 tablet (5 mg total) by mouth 2 (two) times daily. 01/16/23  Yes Wyn Jackee VEAR Mickey., NP  clopidogrel  (PLAVIX ) 75 MG tablet Take 1 tablet (75 mg total) by mouth daily. 07/12/23  Yes Colletta Manuelita Garre, PA-C  digoxin  (LANOXIN ) 0.125 MG tablet Take 1 tablet (0.125 mg total) by mouth daily. 07/02/23  Yes Shahmehdi, Adriana LABOR, MD  ezetimibe  (ZETIA ) 10 MG tablet Take 1 tablet (10 mg total) by mouth daily. 07/02/23  Yes Shahmehdi, Seyed A, MD  Furosemide  (FUROSCIX ) 80 MG/10ML CTKT Inject 80 mg into the skin daily at 12 noon. Please inject Furoscix  today, Thursday, Friday and Saturday. Only use other Furoscix  as directed by HF clinic 01/17/24  Yes Rolan Ezra RAMAN, MD  JARDIANCE  25 MG TABS tablet TAKE ONE TABLET BY MOUTH DAILY 11/27/23  Yes Theola Cuellar S, MD  levothyroxine  (SYNTHROID ) 112 MCG tablet Take 1 tablet (112 mcg total) by mouth daily. 11/21/23  Yes Motwani, Komal, MD  linaclotide  (LINZESS ) 145 MCG CAPS capsule Take 1 capsule (145 mcg total) by mouth daily before breakfast. Lot: [id number], exp: 03-2024 Patient taking differently: Take 145 mcg by mouth daily as needed. Lot: [id number], exp: 03-2024 12/11/23 12/05/24 Yes Craig Palma R, PA-C  melatonin 5 MG TABS Take 5 mg by mouth at bedtime as needed (sleep).   Yes [provider]  midodrine  (PROAMATINE ) 5 MG tablet Take 1 tablet (5 mg total) by mouth 3 (three) times daily with meals. 07/02/23  Yes Shahmehdi, Adriana LABOR, MD   pantoprazole  (PROTONIX ) 40 MG tablet Take 1 tablet (40 mg total) by mouth 2 (two) times daily before a meal. 02/06/23  Yes Rolan Ezra RAMAN, MD  potassium chloride  SA (KLOR-CON  M) 20 MEQ tablet Take 2 tablets (40 mEq total) by mouth 2 (two) times daily. 01/17/24  Yes Rolan Ezra RAMAN, MD  rosuvastatin  (CRESTOR ) 40 MG tablet Take 40 mg by mouth daily.   Yes [provider]  tadalafil  (CIALIS ) 20 MG tablet Take 1 tablet (20 mg total) by mouth daily. 07/19/23  Yes Rolan Ezra RAMAN, MD  torsemide  (DEMADEX ) 20 MG tablet Take 3 tablets (60 mg total) by mouth 2 (two) times daily. 01/17/24  Yes Rolan Ezra RAMAN, MD  Vericiguat  (VERQUVO ) 5 MG TABS Take 1 tablet (5 mg total) by mouth daily. 01/02/24  Yes Rolan Ezra RAMAN, MD  Vitamin D-Vitamin K (VITAMIN K2-VITAMIN D3 PO) Take 1 tablet by mouth  daily with lunch.   Yes [provider]  hyoscyamine  (LEVSIN ) 0.125 MG tablet Take 1 tablet (0.125 mg total) by mouth every 6 (six) hours as needed for cramping. Patient not taking: Reported on 01/23/2024 12/11/23   Craig Alan JONELLE DEVONNA    Past Medical History: Past Medical History:  Diagnosis Date   Acute ST elevation myocardial infarction (STEMI) of anterior wall (HCC) 12/10/2022   Acute ST elevation myocardial infarction (STEMI) of inferior wall (HCC) 10/30/2021   Atrial fibrillation (HCC)    Atrial fibrillation with RVR (HCC) 12/19/2022   CHF (congestive heart failure) (HCC)    Coronary artery disease    Diabetes mellitus without complication (HCC)    DM (diabetes mellitus) (HCC)    H. pylori infection    Heart attack (HCC)    HLD (hyperlipidemia)    Hypertension    ST elevation myocardial infarction involving left anterior descending (LAD) coronary artery (HCC) 12/10/2022    Past Surgical History: Past Surgical History:  Procedure Laterality Date   CARDIOVERSION N/A 12/14/2022   Procedure: CARDIOVERSION;  Surgeon: Kate Lonni CROME, MD;  Location: [facility] INVASIVE CV LAB;  Service:  Cardiovascular;  Laterality: N/A;   CORONARY/GRAFT ACUTE MI REVASCULARIZATION N/A 10/30/2021   Procedure: Coronary/Graft Acute MI Revascularization;  Surgeon: Ladona Heinz, MD;  Location: [facility] INVASIVE CV LAB;  Service: Cardiovascular;  Laterality: N/A;   CORONARY/GRAFT ACUTE MI REVASCULARIZATION N/A 12/10/2022   Procedure: Coronary/Graft Acute MI Revascularization;  Surgeon: Wonda Sharper, MD;  Location: [facility] INVASIVE CV LAB;  Service: Cardiovascular;  Laterality: N/A;   ESOPHAGOGASTRODUODENOSCOPY N/A 03/23/2023   Procedure: EGD (ESOPHAGOGASTRODUODENOSCOPY);  Surgeon: Stacia Glendia BRAVO, MD;  Location: [facility] ENDOSCOPY;  Service: Gastroenterology;  Laterality: N/A;   ICD IMPLANT N/A 06/01/2023   Procedure: ICD IMPLANT;  Surgeon: Kennyth Chew, MD;  Location: [facility] INVASIVE CV LAB;  Service: Cardiovascular;  Laterality: N/A;   IR THORACENTESIS RIGHT ASP PLEURAL SPACE W/IMG GUIDE  11/28/2023   LEFT HEART CATH AND CORONARY ANGIOGRAPHY N/A 10/30/2021   Procedure: LEFT HEART CATH AND CORONARY ANGIOGRAPHY;  Surgeon: Ladona Heinz, MD;  Location: [facility] INVASIVE CV LAB;  Service: Cardiovascular;  Laterality: N/A;   LEFT HEART CATH AND CORONARY ANGIOGRAPHY N/A 12/10/2022   Procedure: LEFT HEART CATH AND CORONARY ANGIOGRAPHY;  Surgeon: Wonda Sharper, MD;  Location: [facility] INVASIVE CV LAB;  Service: Cardiovascular;  Laterality: N/A;   RIGHT HEART CATH N/A 03/20/2023   Procedure: RIGHT HEART CATH;  Surgeon: Rolan Ezra RAMAN, MD;  Location: [facility] INVASIVE CV LAB;  Service: Cardiovascular;  Laterality: N/A;   RIGHT HEART CATH N/A 06/30/2023   Procedure: RIGHT HEART CATH;  Surgeon: Cherrie Toribio JONELLE, MD;  Location: [facility] INVASIVE CV LAB;  Service: Cardiovascular;  Laterality: N/A;    Family History:  Family History  Problem Relation Age of Onset   Diabetes Mother    Chronic Renal Failure Father    Diabetes Father    Chronic Renal Failure Brother    Diabetes Brother    Diabetes Brother    Heart attack Brother    Stroke  Brother     Social History: Social History   Socioeconomic History   Marital status: Married    Spouse name: Hermenia   Number of children: 5   Years of education: Not on file   Highest education level: Bachelor's degree (e.g., BA, AB, BS)  Occupational History   Occupation: part time  Tobacco Use   Smoking status: Never   Smokeless tobacco: Never  Vaping Use   Vaping  status: Never Used  Substance and Sexual Activity   Alcohol use: No   Drug use: No   Sexual activity: Not on file  Other Topics Concern   Not on file  Social History Narrative   ** Merged History Encounter **       Social Drivers of Health   Tobacco Use: Low Risk (12/12/2023)   Patient History    Smoking Tobacco Use: Never    Smokeless Tobacco Use: Never    Passive Exposure: Not on file  Financial Resource Strain: High Risk (12/08/2023)   Overall Financial Resource Strain (CARDIA)    Difficulty of Paying Living Expenses: Hard  Food Insecurity: Food Insecurity Present (12/08/2023)   Epic    Worried About Programme Researcher, Broadcasting/film/video in the Last Year: Sometimes true    Ran Out of Food in the Last Year: Sometimes true  Transportation Needs: No Transportation Needs (12/08/2023)   Epic    Lack of Transportation (Medical): No    Lack of Transportation (Non-Medical): No  Physical Activity: Insufficiently Active (12/08/2023)   Exercise Vital Sign    Days of Exercise per Week: 2 days    Minutes of Exercise per Session: 40 min  Stress: No Stress Concern Present (12/08/2023)   [employer] of Occupational Health - Occupational Stress Questionnaire    Feeling of Stress: Not at all  Social Connections: Moderately Integrated (12/08/2023)   Social Connection and Isolation Panel    Frequency of Communication with Friends and Family: More than three times a week    Frequency of Social Gatherings with Friends and Family: More than three times a week    Attends Religious Services: 1 to 4 times per year    Active Member  of Clubs or Organizations: No    Attends Banker Meetings: Not on file    Marital Status: Married  Depression (PHQ2-9): Low Risk (09/01/2023)   Depression (PHQ2-9)    PHQ-2 Score: 0  Recent Concern: Depression (PHQ2-9) - Medium Risk (07/05/2023)   Depression (PHQ2-9)    PHQ-2 Score: 10  Alcohol Screen: Low Risk (07/12/2023)   Alcohol Screen    Last Alcohol Screening Score (AUDIT): 0  Housing: Low Risk (12/08/2023)   Epic    Unable to Pay for Housing in the Last Year: No    Number of Times Moved in the Last Year: 0    Homeless in the Last Year: No  Utilities: Not At Risk (07/12/2023)   Epic    Threatened with loss of utilities: No  Health Literacy: Adequate Health Literacy (07/12/2023)   B1300 Health Literacy    Frequency of need for help with medical instructions: Never    Allergies:  Allergies[1]  Objective:    Vital Signs:   Temp:  [97.6 F (36.4 C)] 97.6 F (36.4 C) (01/14 0653) Pulse Rate:  [66-85] 66 (01/14 0929) Resp:  [10-24] 10 (01/14 0929) BP: (94-106)/(68-82) 105/75 (01/14 0929) SpO2:  [89 %-100 %] 100 % (01/14 0929) Weight:  [68 kg] 68 kg (01/14 0653)   Filed Weights   01/24/24 0653  Weight: 68 kg    Physical Exam    General: NAD Neck: JVP 14 cm, no thyromegaly or thyroid  nodule.  Lungs: Decreased BS right base.  CV: Nondisplaced PMI.  Heart regular S1/S2, no S3/S4, no murmur.  2+ edema to knees.  No carotid bruit.  Normal pedal pulses.  Abdomen: Soft, nontender, no hepatosplenomegaly, no distention.  Skin: Intact without lesions or rashes.  Neurologic: Alert and  oriented x 3.  Psych: Normal affect. Extremities: No clubbing or cyanosis.  HEENT: Normal.    Telemetry   NSR, personally reviewed  EKG   Pending  Labs  Basic Metabolic Panel: Recent Labs  Lab 01/17/24 1236 01/23/24 1118 01/24/24 0715  NA 137 136 139  K 4.0 3.4* 4.5  CL 99 98 100  CO2 28 30 28   GLUCOSE 142* 124* 158*  BUN 33* 25* 29*  CREATININE 1.39* 1.18 1.34*   CALCIUM  9.3 9.3 9.3    Liver Function Tests: Recent Labs  Lab 01/17/24 1236 01/23/24 1118  AST 31 22  ALT 22 15  ALKPHOS 156* 167*  BILITOT 0.6 0.7  PROT 7.8 7.5  ALBUMIN  4.1 3.9   No results for input(s): LIPASE, AMYLASE in the last 168 hours. No results for input(s): AMMONIA in the last 168 hours.  CBC: Recent Labs  Lab 01/17/24 1236  WBC 4.5  HGB 12.8*  HCT 39.3  MCV 85.4  PLT 181    Cardiac Enzymes: No results for input(s): CKTOTAL, CKMB, CKMBINDEX, TROPONINI in the last 168 hours.  BNP: BNP (last 3 results) Recent Labs    07/19/23 1333 08/25/23 1125 10/20/23 1325  BNP 1,374.0* 1,477.8* 1,886.7*    ProBNP (last 3 results) Recent Labs    01/17/24 1236 01/23/24 1118  PROBNP 1,945.0* 3,357.0*     CBG: Recent Labs  Lab 01/24/24 0656  GLUCAP 149*    Coagulation Studies: No results for input(s): LABPROT, INR in the last 72 hours.  Imaging: CARDIAC CATHETERIZATION Result Date: 01/24/2024 1. Elevated right and left heart filling pressures. 2. Mild pulmonary venous hypertension 3. Low cardiac output by Fick and thermodilution. 4. PAPi low but not markedly low.    Assessment/Plan   1. Acute on chronic systolic CHF/ischemic cardiomyopathy/low output HF:  Biotronik ICD.  Echo 12/24 with EF 20-25%, no LV thrombus, AK mid septum to apex, moderately reduced RV systolic function. Echo 2/25 with EF 25%, mildly reduced RV function.  Low BP has limited GDMT titration.  His profound fatigue has been concerning for low output HF. RHC 3/25 showed low CI by thermo (2.05) but preserved by Fick.  Borderline elevated PCWP, normal RA pressure.  There was also moderate mixed pulmonary arterial/pulmonary venous hypertension.  With his significant symptoms, he was started on milrinone  0.25 and added sildenafil  to try to lower PA pressure.  Milrinone  was later weaned off with medication adjustment, and co-ox remained excellent.  He was readmitted in 6/25  with CHF; RHC showed moderate mixed pulmonary arterial/pulmonary venous hypertension with CI low at 2.1 by Fick and thermo.  Echo in 6/25 showed EF 20-25%, mild LV dilation, moderate RV dysfunction, normal RV size, PASP 44 mmHg, mild-moderate MR.  We have had to peel back on GDMT due to low BP and he is now taking midodrine .  With low output heart failure (CI 2.1 on 6/25 RHC and inability to tolerate GDMT - now on midodrine ), we have been discussing advanced therapies.  He is not interested in transplant and at his age, it would be more difficult to obtain. I think he would be a reasonable LVAD candidate. We have a window at this time while his RV function is not severely depressed and his renal function is relatively preserved.  He has completed workup.  We had him set up for LVAD surgery, but he decided that he wanted to wait longer (daughter getting married).  At appointment last week, he had gained 17 lbs and was  significantly volume overloaded with NYHA class III symptoms.  I gave him Furoscix  over the weekend and increased his torsemide  after that, but at appointment yesterday, he was up another 4 lbs. Unfortunately, he appears to have failed outpatient diuresis.  RHC was done today, showing low cardiac output by Fick and thermodilution and elevated filling pressures.  PAPi low at 1.6 but not markedly low.  I remain concerned that we miss his window for LVAD implantation.  - Norva will remain in place, admitting to [facility].  - Milrinone  0.25 mcg/kg/min and follow cardiac output.  - Lasix  80 mg IV x 1 then 12 mg/hr gtt, follow response.  - Continue Jardiance  10 mg daily (hold if he decides on LVAD). - Continue digoxin  0.125 daily, check digoxin  level today.  - He has been on midodrine  5 mg tid to maintain MAP.  SBP remain in 90s, he denies lightheadedness.   - off Toprol  XL with low output.  - Off spironolactone  with low BP.  - Continue Verquvo  5 mg daily.  - Repeat echo - We have discussed LVAD  extensively, daughter present at appt Tuesday.  I would like to optimize his volume status and then get LVAD surgery arranged, ideally this admission.  He still seems on the fence about the device.  Continue discussion and will have surgery come by to see him again.  2. CAD:  Hx inferior STEMI 10/23 s/p PCI/DES to RCA. Anterior STEMI 12/10/22, cath showed occluded pLAD treated with PCI/DES, prior RCA stent patent, severe disease OM1 and distal LCx treated medically. No chest pain.  - > 1 year post-PCI, will hold Plavix  pre-potential LVAD.   - No ASA given Eliquis  use.  - Continue rosuvastatin  40 mg + Zetia  10 mg daily.  LDL above goal < 55 in 11/25, would ideally get on Repatha but for now will focus on preparation for LVAD.   3. Atrial fibrillation: Paroxysmal. Has been in NSR.  - Heparin  gtt for now, has been on apixaban  at home.  - Continue amiodarone  100 mg daily, recent LFTs and TSH normal.  4. DM II: Continue Jardiance  (hold if LVAD surgery set up).  5. 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 Not a smoker, no known lung disease. CXR has shown some chronic interstitial changes. HRCT chest with bibasilar bronchiectasis that may be due to aspiration (though swallow study negative).  V/Q scan showed no chronic PE. Rheumatologic serologies negative. RHC today with improved PA pressure, only mild pulmonary venous hypertension.  - Continue tadalafil  20 mg daily.   6. Pleural effusion: S/p right thoracentesis 3/25 and again in 11/25. Pleural fluid was transudative by Light's criteria, no malignant cells. By exam today, I suspect he has reaccumulated pleural fluid.   - CXR, may need repeat thoracentesis.  7. GI: History of early satiety, GI discomfort.  CT abdomen/pelvis with gastric wall thickening. GI was consulted and EGD was performed, no definite evidence for malignancy, biopsies taken for H pylori testing and pathology => negative for H  pylori and no gastric cancer.  Gastric emptying study normal in 9/25.  Abdominal US  showed normal appearing liver in 9/25.  Virtual colonoscopy unremarkable.  He actually is feeling better with improved appetite.  8. 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  - Continue Levoxyl , recent TSH ok.  9. Fe deficiency anemia: He has had IV FE.   Ezra Shuck, MD 01/24/2024, 10:00 AM  Advanced Heart Failure Team Pager [phone] (M-F; 7a - 5p)  Please visit [email or web address]: For overnight coverage please call cardiology fellow first. If fellow not available call Shock/ECMO MD on call.  For ECMO / Mechanical Support (Impella, IABP, LVAD) issues call Shock / ECMO MD on call.       [1]  Allergies Allergen Reactions   Bovine (Beef) Protein-Containing Drug Products    Porcine (Pork) Protein-Containing Drug Products Other (See Comments)    Religious observance   "

## 2024-01-24 NOTE — Progress Notes (Addendum)
 PHARMACY - ANTICOAGULATION CONSULT NOTE  Pharmacy Consult for heparin  Indication: atrial fibrillation  Allergies[1]  Patient Measurements: Height: 6' (182.9 cm) Weight: 68 kg (150 lb) IBW/kg (Calculated) : 77.6 HEPARIN  DW (KG): 68  Vital Signs: Temp: 97.3 F (36.3 C) (01/14 1900) Temp Source: Oral (01/14 1600) BP: 95/66 (01/14 1900) Pulse Rate: 76 (01/14 1900)  Labs: Recent Labs    01/23/24 1118 01/24/24 0715 01/24/24 0916 01/24/24 2205  HGB  --   --  12.9*  --   HCT  --   --  38.0*  --   APTT  --   --   --  60*  HEPARINUNFRC  --   --   --  0.83*  CREATININE 1.18 1.34*  --   --     Estimated Creatinine Clearance: 50.7 mL/min (A) (by C-G formula based on SCr of 1.34 mg/dL (H)).  Assessment: 51 yom presenting with low cardiac output and elevated filling pressures on RHC for diuresis and milrinone  - plan to admit. On apixaban  PTA for hx Afib (LD 1/13 AM).   Plan to change to heparin  infusion - given recent DOAC will monitor both aPTT and heparin  level. Okay to restart heparin . No s/sx of bleeding.   PM: heparin  level falsely elevated and aPTT below goal on 950 units/hr. Per RN, no issues with heparin  infusion running continuously or signs/symptoms of bleeding.  Goal of Therapy:  Heparin  level 0.3-0.7 units/ml aPTT 66-102 seconds Monitor platelets by anticoagulation protocol: Yes   Plan:  Increase heparin  infusion to 1050 units/hr 6 hr aPTT  Monitor daily HL/aPTT, CBC, and for s/sx of bleeding   Thank you for allowing pharmacy to participate in this patient's care,  Lynwood Poplar, PharmD, BCPS Clinical Pharmacist 01/24/2024 11:07 PM         [1]  Allergies Allergen Reactions   Bovine (Beef) Protein-Containing Drug Products    Porcine (Pork) Protein-Containing Drug Products Other (See Comments)    Religious observance

## 2024-01-24 NOTE — Progress Notes (Signed)
 Orthopedic Tech Progress Note Patient Details:  Garrett Roberts Jun 03, 1955 [id number]  Patient ID: Garrett Roberts, male   DOB: 1955-04-21, 69 y.o.   MRN: [id number] Order for unna boots received, please page ortho tech when patient is ready for unna boots. Camellia Bo 01/24/2024, 12:25 PM

## 2024-01-24 NOTE — Progress Notes (Signed)
 Orthopedic Tech Progress Note Patient Details:  Garrett Roberts May 29, 1955 [id number]  Ortho Devices Type of Ortho Device: Radio broadcast assistant Ortho Device/Splint Location: BLE Ortho Device/Splint Interventions: Ordered, Adjustment, Application   Post Interventions Patient Tolerated: Well Instructions Provided: Other (comment)  Garrett Roberts 01/24/2024, 3:36 PM

## 2024-01-24 NOTE — Progress Notes (Signed)
 H&V Care Navigation CSW Progress Note  Outpatient Heart Failure CSW met with patient and patient daughters, Rana and Reem, at bedside to reconfirm caregiver plan if patient proceeds with LVAD implant.  Per CSW note on 07/05/23 plan was for patient daughter Rana to be primary caregiver and other daughter Reem to be secondary caregiver.  Patient also has a wife who works in [place]  and lives in [place] during the week but is present on weekends.  Both daughters confirm their willingness to provide caregiving for patient.  Both work from home so are available for 24/7 supervision for 2 weeks post op and state work is flexible enough to allow them to provide transportation to clinic visits.    Patient and daughters also confirm that there are no financial concerns as patient gets retirement income and daughters and wife are employed so all household expenses are met and surgery would not have affect on this.  They continue to be undecided on surgery at this time and will continue to speak with providers to make decision on LVAD.  CSW will continue to follow along during inpatient stay and assist as needed  Twana Wileman H. Vernadine Coombs, LCSW Clinical Social Worker [facility] Desk#: [phone] Cell#: [phone]

## 2024-01-24 NOTE — Progress Notes (Signed)
 PHARMACY - ANTICOAGULATION CONSULT NOTE  Pharmacy Consult for heparin  Indication: atrial fibrillation  Allergies[1]  Patient Measurements: Height: 6' (182.9 cm) Weight: 68 kg (150 lb) IBW/kg (Calculated) : 77.6 HEPARIN  DW (KG): 68  Vital Signs: Temp: 97.6 F (36.4 C) (01/14 0653) Temp Source: Oral (01/14 0653) BP: 105/80 (01/14 1030) Pulse Rate: 76 (01/14 1030)  Labs: Recent Labs    01/23/24 1118 01/24/24 0715 01/24/24 0916  HGB  --   --  12.9*  HCT  --   --  38.0*  CREATININE 1.18 1.34*  --     Estimated Creatinine Clearance: 50.7 mL/min (A) (by C-G formula based on SCr of 1.34 mg/dL (H)).   Medical History: Past Medical History:  Diagnosis Date   Acute ST elevation myocardial infarction (STEMI) of anterior wall (HCC) 12/10/2022   Acute ST elevation myocardial infarction (STEMI) of inferior wall (HCC) 10/30/2021   Atrial fibrillation (HCC)    Atrial fibrillation with RVR (HCC) 12/19/2022   CHF (congestive heart failure) (HCC)    Coronary artery disease    Diabetes mellitus without complication (HCC)    DM (diabetes mellitus) (HCC)    H. pylori infection    Heart attack (HCC)    HLD (hyperlipidemia)    Hypertension    ST elevation myocardial infarction involving left anterior descending (LAD) coronary artery (HCC) 12/10/2022    Medications:  Scheduled:   amiodarone   100 mg Oral Daily   aspirin   81 mg Oral Pre-Cath   digoxin   0.125 mg Oral Daily   empagliflozin   25 mg Oral Daily   ezetimibe   10 mg Oral Daily   furosemide        levothyroxine   112 mcg Oral Daily   midodrine   5 mg Oral TID WC   pantoprazole   40 mg Oral BID AC   rosuvastatin   40 mg Oral Daily   sodium chloride  flush  3 mL Intravenous Q12H   sodium chloride  flush  3 mL Intravenous Q12H   tadalafil   20 mg Oral Daily   Vericiguat   5 mg Oral Daily    Assessment: 68 yom presenting with low cardiac output and elevated filling pressures on RHC for diuresis and milrinone  - plan to admit. On  apixaban  PTA for hx Afib (LD 1/13 AM).   Plan to change to heparin  infusion - given recent DOAC will monitor both aPTT and heparin  level. Okay to restart heparin . No s/sx of bleeding.   Goal of Therapy:  Heparin  level 0.3-0.7 units/ml aPTT 66-102 seconds Monitor platelets by anticoagulation protocol: Yes   Plan:  Start heparin  infusion at 950 units/hr Order 8 hr heparin  level and aPTT  Monitor daily HL/aPTT, CBC, and for s/sx of bleeding   Thank you for allowing pharmacy to participate in this patient's care,  Suzen Sour, PharmD, BCCCP Clinical Pharmacist  Phone: [phone] 01/24/2024 12:48 PM  Please check AMION for all [facility] Pharmacy phone numbers After 10:00 PM, call Main Pharmacy [phone]      [1]  Allergies Allergen Reactions   Bovine (Beef) Protein-Containing Drug Products    Porcine (Pork) Protein-Containing Drug Products Other (See Comments)    Religious observance

## 2024-01-25 ENCOUNTER — Other Ambulatory Visit: Payer: Self-pay

## 2024-01-25 ENCOUNTER — Other Ambulatory Visit (HOSPITAL_COMMUNITY): Payer: Self-pay

## 2024-01-25 ENCOUNTER — Inpatient Hospital Stay (HOSPITAL_COMMUNITY)

## 2024-01-25 DIAGNOSIS — I272 Pulmonary hypertension, unspecified: Secondary | ICD-10-CM

## 2024-01-25 DIAGNOSIS — R570 Cardiogenic shock: Secondary | ICD-10-CM

## 2024-01-25 LAB — RENAL FUNCTION PANEL
Albumin: 3.3 g/dL — ABNORMAL LOW (ref 3.5–5.0)
Albumin: 3.5 g/dL (ref 3.5–5.0)
Anion gap: 8 (ref 5–15)
Anion gap: 8 (ref 5–15)
BUN: 22 mg/dL (ref 8–23)
BUN: 24 mg/dL — ABNORMAL HIGH (ref 8–23)
CO2: 30 mmol/L (ref 22–32)
CO2: 28 mmol/L (ref 22–32)
Calcium: 8.3 mg/dL — ABNORMAL LOW (ref 8.9–10.3)
Calcium: 8.5 mg/dL — ABNORMAL LOW (ref 8.9–10.3)
Chloride: 96 mmol/L — ABNORMAL LOW (ref 98–111)
Chloride: 97 mmol/L — ABNORMAL LOW (ref 98–111)
Creatinine, Ser: 1.2 mg/dL (ref 0.61–1.24)
Creatinine, Ser: 1.24 mg/dL (ref 0.61–1.24)
GFR, Estimated: >60 mL/min
GFR, Estimated: >60 mL/min
Glucose, Bld: 220 mg/dL — ABNORMAL HIGH (ref 70–99)
Glucose, Bld: 219 mg/dL — ABNORMAL HIGH (ref 70–99)
Phosphorus: 2.8 mg/dL (ref 2.5–4.6)
Phosphorus: 2.9 mg/dL (ref 2.5–4.6)
Potassium: 3.3 mmol/L — ABNORMAL LOW (ref 3.5–5.1)
Potassium: 4.6 mmol/L (ref 3.5–5.1)
Sodium: 134 mmol/L — ABNORMAL LOW (ref 135–145)
Sodium: 134 mmol/L — ABNORMAL LOW (ref 135–145)

## 2024-01-25 LAB — BASIC METABOLIC PANEL WITH GFR
Anion gap: 10 (ref 5–15)
BUN: 23 mg/dL (ref 8–23)
CO2: 30 mmol/L (ref 22–32)
Calcium: 8.4 mg/dL — ABNORMAL LOW (ref 8.9–10.3)
Chloride: 97 mmol/L — ABNORMAL LOW (ref 98–111)
Creatinine, Ser: 1.16 mg/dL (ref 0.61–1.24)
GFR, Estimated: >60 mL/min
Glucose, Bld: 120 mg/dL — ABNORMAL HIGH (ref 70–99)
Potassium: 2.9 mmol/L — ABNORMAL LOW (ref 3.5–5.1)
Sodium: 137 mmol/L (ref 135–145)

## 2024-01-25 LAB — CBC
HCT: 38 % — ABNORMAL LOW (ref 39.0–52.0)
Hemoglobin: 12.2 g/dL — ABNORMAL LOW (ref 13.0–17.0)
MCH: 27.9 pg (ref 26.0–34.0)
MCHC: 32.1 g/dL (ref 30.0–36.0)
MCV: 86.8 fL (ref 80.0–100.0)
Platelets: 193 K/uL (ref 150–400)
RBC: 4.38 MIL/uL (ref 4.22–5.81)
RDW: 18.1 % — ABNORMAL HIGH (ref 11.5–15.5)
WBC: 5.7 K/uL (ref 4.0–10.5)
nRBC: 0 % (ref 0.0–0.2)

## 2024-01-25 LAB — MAGNESIUM
Magnesium: 2 mg/dL (ref 1.7–2.4)
Magnesium: 2 mg/dL (ref 1.7–2.4)

## 2024-01-25 LAB — HEPARIN LEVEL (UNFRACTIONATED): Heparin Unfractionated: 0.83 [IU]/mL — ABNORMAL HIGH (ref 0.30–0.70)

## 2024-01-25 LAB — COOXEMETRY PANEL
Carboxyhemoglobin: 1.3 % (ref 0.5–1.5)
Methemoglobin: <0.7 % (ref 0.0–1.5)
O2 Saturation: 63.4 %
Total hemoglobin: 12.4 g/dL (ref 12.0–16.0)

## 2024-01-25 LAB — APTT
aPTT: 98 s — ABNORMAL HIGH (ref 24–36)
aPTT: 66 s — ABNORMAL HIGH (ref 24–36)

## 2024-01-25 LAB — TSH: TSH: 4.5 u[IU]/mL (ref 0.350–4.500)

## 2024-01-25 LAB — PREALBUMIN: Prealbumin: 10 mg/dL — ABNORMAL LOW (ref 18–38)

## 2024-01-25 LAB — PRO BRAIN NATRIURETIC PEPTIDE: Pro Brain Natriuretic Peptide: 2392 pg/mL — ABNORMAL HIGH

## 2024-01-25 MED ORDER — SODIUM CHLORIDE 0.9 % IV SOLN: INTRAVENOUS | Status: AC

## 2024-01-25 MED ORDER — SODIUM CHLORIDE 0.9 % IV SOLN: 250.0000 mL | INTRAVENOUS | Status: AC | PRN

## 2024-01-25 MED ORDER — POTASSIUM CHLORIDE CRYS ER 20 MEQ PO TBCR: 40.0000 meq | EXTENDED_RELEASE_TABLET | ORAL | Status: DC

## 2024-01-25 MED ORDER — FUROSEMIDE 10 MG/ML IJ SOLN
12.0000 mg/h | INTRAVENOUS | Status: DC
  Administered 2024-01-25 (×2): 12 mg/h via INTRAVENOUS
  Filled 2024-01-25 (×2): qty 200

## 2024-01-25 MED ORDER — POTASSIUM CHLORIDE 10 MEQ/50ML IV SOLN
10.0000 meq | INTRAVENOUS | Status: AC
  Administered 2024-01-25 (×3): 10 meq via INTRAVENOUS
  Filled 2024-01-25 (×3): qty 50

## 2024-01-25 MED ORDER — ENSURE PLUS HIGH PROTEIN PO LIQD
237.0000 mL | Freq: Three times a day (TID) | ORAL | Status: DC
  Administered 2024-01-25 – 2024-01-31 (×15): 237 mL via ORAL

## 2024-01-25 MED ORDER — POTASSIUM CHLORIDE CRYS ER 20 MEQ PO TBCR
40.0000 meq | EXTENDED_RELEASE_TABLET | Freq: Four times a day (QID) | ORAL | Status: AC
  Administered 2024-01-25 (×2): 40 meq via ORAL
  Filled 2024-01-25 (×2): qty 2

## 2024-01-25 MED ORDER — ORAL CARE MOUTH RINSE: 15.0000 mL | OROMUCOSAL | Status: DC | PRN

## 2024-01-25 MED ORDER — FUROSEMIDE 10 MG/ML IJ SOLN: 40.0000 mg | Freq: Once | INTRAMUSCULAR | Status: DC

## 2024-01-25 MED ORDER — POTASSIUM CHLORIDE 10 MEQ/100ML IV SOLN: 10.0000 meq | INTRAVENOUS | Status: DC

## 2024-01-25 MED ORDER — ENSURE PLUS HIGH PROTEIN PO LIQD
237.0000 mL | Freq: Two times a day (BID) | ORAL | Status: DC
  Administered 2024-01-25: 237 mL via ORAL

## 2024-01-25 MED ORDER — POTASSIUM CHLORIDE 10 MEQ/50ML IV SOLN
INTRAVENOUS | Status: AC
  Administered 2024-01-25: 10 meq via INTRAVENOUS
  Filled 2024-01-25: qty 50

## 2024-01-25 MED ORDER — POTASSIUM CHLORIDE 10 MEQ/50ML IV SOLN: 10.0000 meq | INTRAVENOUS | Status: DC

## 2024-01-25 NOTE — Evaluation (Signed)
 Physical Therapy Evaluation Patient Details Name: Garrett Roberts MRN: [id number] DOB: 1955/04/24 Today's Date: 01/25/2024  History of Present Illness  69 y.o. presented [facility] 01/24/24 for R heart cath. Admitted following procedure for diuresis and milrinone  and discussion about advanced therapies.  PMHx:  Ant wall STEMI, inferior wall STEMI, afib, ischemic cardiomyopathy/chronic systolic CHF, CAD, DM, HTN, HLD, cardioversions, Coronary revascularizations via CATH, ICD  Clinical Impression  PTA pt living with 3 daughters and wife who is available on the weekends. Pt was independent with mobility, ADLs, and iADLs. Pt CHF increasingly limiting pt abilities, however with diuresis over the last few days pt has regained most of his independence ambulating 750 feet with mod I for management of lines and leads. If pt is to undergo LVAD placement he will need to have as much strength and endurance possible.  Discharge disposition will not be clear until treatment plan is solidified.  PT will continue to follow patient acutely for prehab in prep for LVAD.       Can travel by private vehicle    Yes    Equipment Recommendations None recommended by PT     Functional Status Assessment Patient has had a recent decline in their functional status and demonstrates the ability to make significant improvements in function in a reasonable and predictable amount of time.     Precautions / Restrictions Precautions Precautions: Other (comment) Precaution/Restrictions Comments: Norva Restrictions Weight Bearing Restrictions Per Provider Order: No      Mobility  Bed Mobility Overal bed mobility: Modified Independent             General bed mobility comments: for management of lines and leads    Transfers Overall transfer level: Modified independent                 General transfer comment: for management of lines and leads    Ambulation/Gait Ambulation/Gait assistance: Modified  independent (Device/Increase time) Gait Distance (Feet): 750 Feet Assistive device: Rolling walker (2 wheels) (but only to hold all of his lines and leads) Gait Pattern/deviations: WFL(Within Functional Limits), Step-through pattern Gait velocity: WFL Gait velocity interpretation: >4.37 ft/sec, indicative of normal walking speed   General Gait Details: strong, steady gait         Balance Overall balance assessment: Independent                                           Pertinent Vitals/Pain Pain Assessment Pain Assessment: No/denies pain    Home Living Family/patient expects to be discharged to:: Private residence Living Arrangements: Spouse/significant other;Children (3 daughters) Available Help at Discharge: Family;Available 24 hours/day;Available PRN/intermittently Type of Home: House Home Access: Stairs to enter   Entrance Stairs-Number of Steps: 5 Alternate Level Stairs-Number of Steps: 15 Home Layout: Two level;1/2 bath on main level;Bed/bath upstairs Home Equipment: None      Prior Function Prior Level of Function : Independent/Modified Independent             Mobility Comments: was running 5 miles a day 2 years ago, continues to be independent but CHF has limited his strength and endurance ADLs Comments: independent     Extremity/Trunk Assessment   Upper Extremity Assessment Upper Extremity Assessment: Overall WFL for tasks assessed    Lower Extremity Assessment Lower Extremity Assessment: Overall WFL for tasks assessed    Cervical / Trunk Assessment Cervical / Trunk Assessment:  Normal  Communication   Communication Communication: No apparent difficulties    Cognition Arousal: Alert Behavior During Therapy: WFL for tasks assessed/performed   PT - Cognitive impairments: No apparent impairments                         Following commands: Intact       Cueing Cueing Techniques: Verbal cues, Visual cues      General Comments General comments (skin integrity, edema, etc.): BP 94/65, max noted HR with ambulation 112 bpm, SpO2 >90%O2 on RA        Assessment/Plan    PT Assessment Patient needs continued PT services (for strenghtening in preparation of possible LVAD placement)  PT Problem List Decreased strength;Cardiopulmonary status limiting activity       PT Treatment Interventions Gait training;Therapeutic activities;Therapeutic exercise    PT Goals (Current goals can be found in the Care Plan section)  Acute Rehab PT Goals PT Goal Formulation: With patient Time For Goal Achievement: 02/08/24 Potential to Achieve Goals: Good    Frequency Min 3X/week        AM-PAC PT 6 Clicks Mobility  Outcome Measure Help needed turning from your back to your side while in a flat bed without using bedrails?: None Help needed moving from lying on your back to sitting on the side of a flat bed without using bedrails?: None Help needed moving to and from a bed to a chair (including a wheelchair)?: None Help needed standing up from a chair using your arms (e.g., wheelchair or bedside chair)?: None Help needed to walk in hospital room?: None Help needed climbing 3-5 steps with a railing? : None 6 Click Score: 24    End of Session Equipment Utilized During Treatment: Gait belt Activity Tolerance: Patient tolerated treatment well Patient left: in chair;with call bell/phone within reach;with family/visitor present;with chair alarm set Nurse Communication: Mobility status PT Visit Diagnosis: Muscle weakness (generalized) (M62.81)    Time: 8684-8641 PT Time Calculation (min) (ACUTE ONLY): 43 min   Charges:   PT Evaluation $PT Eval Low Complexity: 1 Low PT Treatments $Therapeutic Exercise: 23-37 mins PT General Charges $$ ACUTE PT VISIT: 1 Visit         Dwan Hemmelgarn B. Fleeta Lapidus PT, DPT Acute Rehabilitation Services Please use secure chat or  Call Office [phone]   Almarie KATHEE Fleeta [facility] 01/25/2024, 2:31 PM

## 2024-01-25 NOTE — Progress Notes (Signed)
 Palliative:  HPI: 69 y.o. male  with past medical history of HFrEF EF 20-25%, ICM, CAD, inferior STEMI 10/2021 s/p DES to RCA, anterior STEMI 11/2022 s/p DES to LAD, CKD stage 2, DM2, HTN, HLD, s/p Biotronik ICD, s/p right thoracentesis  11/25 1.1L admitted on 01/24/2024 with acute on chronic heart failure exacerbation with fluid overload, orthopnea, cough. Admitted to ICU and Swan placed. S/P RHC 1/14. Palliative care requested for ongoing VAD evaluation.    I met today with Garrett Roberts along with daughter at bedside. He is sitting up in the recliner and feeling overall a little better. We reviewed progress, discussions, and his thoughts today. We spent more time discussing heart failure trajectory and expectations. We reviewed concerns for RV function, kidney function, and frailty and how these can worsen quickly.   I asked Garrett Roberts is he were told that he was not a candidate for VAD and this was no longer an option for him how he would feel. He does say he would be disappointed if he missed the opportunity. I shared if he would feel this way then what is his hesitation now. Ultimately we came to the conclusion that he is not convinced that he truly needs this yet. He has had a lot of different feedback and opinions from physicians including those in his family that have weighed in. He also shares that he is not mentally prepared to proceed with VAD. He seems fearful that VAD will lead to decreased quality of life for him. He continues to endorse that he is not afraid to die as this is the natural process of life.   I did discuss with him further about what he is willing to go through. We discussed potential of intensive care and if he would desire feeding tube or dialysis if needed to assist in his recovery. He does not seem to be interested in these interventions. I discussed with him code status. During discussion he is clear that he cannot say no due to his religious belief that this would be equivalent  to suicide and he cannot say kill me. We spent time deliberating the difference between his body dying already with poor prognosis in the setting of advanced heart failure vs making a decision to die. I shared my my views and beliefs on resuscitation and how this differs from his. He continues to endorse no fear of dying. He does endorse he would not desire to be in a condition where he is unable to speak and communicate. He does endorse quality of life. He just cannot make limits for resuscitation for himself but trusts his family and medical team to do what is best for him. He does view resuscitation as a different type decision than VAD, dialysis, etc.   He is willing to meet again with surgeon. He continues to consider if he wants VAD. He seems more inclined to desire VAD today just not convinced he needs this yet (I did explain why and what the medical team is looking at to determine timing - if RV worsens this would be problematic for success with VAD). He continues to consider his wishes.   All questions/concerns addressed. Emotional support provided.   Exam: Awake, alert, oriented. Frail. No distress. R internal jugular Swan. RRR. Breathing regular, unlabored. BLE improved - wrapped.   Plan: - Ongoing discussions needed.  - I will return Sunday and continue to follow and support  70 min  Bernarda Kitty, NP Palliative Medicine Team Pager [phone]  Please see [email or web address] for schedule) Team Phone [phone]

## 2024-01-25 NOTE — Progress Notes (Signed)
 PHARMACY - ANTICOAGULATION CONSULT NOTE  Pharmacy Consult for heparin  Indication: atrial fibrillation  Allergies[1]  Patient Measurements: Height: 6' (182.9 cm) Weight: 68 kg (150 lb) IBW/kg (Calculated) : 77.6 HEPARIN  DW (KG): 68  Vital Signs: Temp: 97 F (36.1 C) (01/15 0200) BP: 89/73 (01/15 0000) Pulse Rate: 72 (01/15 0200)  Labs: Recent Labs    01/23/24 1118 01/24/24 0715 01/24/24 0916 01/24/24 2205 01/25/24 0454  HGB  --   --  12.9*  --   --   HCT  --   --  38.0*  --   --   APTT  --   --   --  60* 98*  HEPARINUNFRC  --   --   --  0.83* 0.83*  CREATININE 1.18 1.34*  --   --   --     Estimated Creatinine Clearance: 50.7 mL/min (A) (by C-G formula based on SCr of 1.34 mg/dL (H)).  Assessment: 36 yom presenting with low cardiac output and elevated filling pressures on RHC for diuresis and milrinone  - plan to admit. On apixaban  PTA for hx Afib (LD 1/13 AM).   Plan to change to heparin  infusion - given recent DOAC will monitor both aPTT and heparin  level. Okay to restart heparin . No s/sx of bleeding.   AM: heparin  level falsely elevated and aPTT at goal on 1050 units/hr. Per RN, no issues with heparin  infusion running continuously or signs/symptoms of bleeding.   Goal of Therapy:  Heparin  level 0.3-0.7 units/ml aPTT 66-102 seconds Monitor platelets by anticoagulation protocol: Yes   Plan:  Continue heparin  infusion at 1050 units/hr 6 hr confirmatory aPTT + CBC Monitor daily HL/aPTT, CBC, and for s/sx of bleeding   Thank you for allowing pharmacy to participate in this patient's care,  Lynwood Poplar, PharmD, BCPS Clinical Pharmacist 01/25/2024 5:25 AM          [1]  Allergies Allergen Reactions   Bovine (Beef) Protein-Containing Drug Products    Porcine (Pork) Protein-Containing Drug Products Other (See Comments)    Religious observance

## 2024-01-25 NOTE — Progress Notes (Signed)
 Initial Nutrition Assessment  DOCUMENTATION CODES:   Severe malnutrition in context of chronic illness  INTERVENTION:  Ensure Plus High Protein po TID, each supplement provides 350 kcal and 20 grams of protein.  Encouraged adequate intake of meals and supplements to meet increased protein and calorie needs in preparation of LVAD  Continue to monitor intake, currently PO intake + supplements appears adequate to meet needs but if intake becomes poor, recommend supplemental nutrition support to optimize nutrition prior to surgery  NUTRITION DIAGNOSIS:   Severe Malnutrition related to chronic illness as evidenced by severe muscle depletion, severe fat depletion, percent weight loss.  GOAL:   Patient will meet greater than or equal to 90% of their needs  MONITOR:   PO intake, Supplement acceptance  REASON FOR ASSESSMENT:   Consult Assessment of nutrition requirement/status  ASSESSMENT:   Pt with hx of CAD, diabetes, HTN, HLD, CHF, STEMI s/p PCI/DES to RCA (10/2021 and 11/2022), recent ICD placement 05/2023 and R thoracentesis 11/2023. Pt admitted from cardiologist for diuresis and possible LVAD workup.  1/14 admitted  Spoke with pt and daughter at bedside. Pt reports he is feeling better now that fluid has come off. Pt suspect he is currently around his dry weight (64.6 kg). Pt reports he ate 50% of breakfast this morning which consisted of pancakes and turkey sausage w/ fruit. Pt reports he is currently dealing with some constipation, discussed bowel regimen with pt. Pt has hx of chronic constipation and usually treats it at home with fiber intake as well as fenugreek + anise which seems to work well but pt agreeable to bowel regimen while here.  PTA, pt was eating well the last few months although he reportedly was not eating well for 4 months prior to that. Daughter reports pt had a very poor appetite from about February to May in which pt lost 10% body weight which is clinically  significant for the time frame. Daughter reports it felt like he lost actual weight not just fluid weight and he lost a lot of muscle during that time. Since then, pt has been able to maintain weight with slight fluid fluctuations but his appetite has improved. The last few months he has been eating 3 meals per day with maybe 1 snack in between as well as consuming 1-2 Ensure shakes daily.  24 h recall: Breakfast: eggs, turkey sausage, oatmeal, fruit Lunch: chicken/fish w/ vegetables, millet Dinner: chicken/fish w/ vegetables, millet Snack: 1-2 Ensures, fruit Pt reports he only avoids pork products, red meat, and fried foods but otherwise eats a variety of fruits and vegetables.  Pt's nutrition focused physical exam shows severe muscle depletions and severe fat depletions, indicative of malnutrition. Suspect malnutrition has been ongoing with fluctuations in appetite related to chronic heart failure. Pt meets criteria for severe malnutrition.   Weight Trends: 1/27 73.3 kg 2/17 70.9 kg 3/20 67.4 kg 4/22 69.3 kg 5/22 64.5 kg 6/23 64.3 kg 8/8 65.3 kg 10/10 64.9 kg 12/2 65.3 kg 01/7 71.5 kg  Since Admission: 1/14 68 kg 1/15 64.6 kg ** loss consistent w/ amount of fluid diuresed since admission **  Intake/Output Summary (Last 24 hours) at 01/25/2024 1339 Last data filed at 01/25/2024 1310 Gross per 24 hour  Intake 1773.51 ml  Output 4200 ml  Net -2426.49 ml   Net IO Since Admission: -3,526.49 mL [01/25/24 1339]  Nutritionally Relevant Medications: Scheduled Meds:  amiodarone   100 mg Oral Daily   empagliflozin   25 mg Oral Daily   levothyroxine   112 mcg Oral Daily   midodrine   5 mg Oral TID WC   pantoprazole   40 mg Oral BID AC   potassium chloride   40 mEq Oral Q6H   rosuvastatin   40 mg Oral Daily   Vericiguat   5 mg Oral Daily   Continuous Infusions:  lasix  Now stopped   heparin  1,050 Units/hr (01/25/24 0800)   milrinone  0.375 mcg/kg/min (01/25/24 0800)   potassium chloride       PRN Meds:.sodium chloride , acetaminophen , ondansetron  (ZOFRAN ) IV, mouth rinse, sodium chloride  flush  Labs Reviewed: Potassium 2.9<--3.5 (17% decrease) Magnesium 2.0 (WNL) Phosphorus 2.8 (WNL) CBG no recent HgbA1c 6.4  NUTRITION - FOCUSED PHYSICAL EXAM:  Flowsheet Row Most Recent Value  Orbital Region Severe depletion  Upper Arm Region Moderate depletion  Thoracic and Lumbar Region Severe depletion  Buccal Region Severe depletion  Temple Region Severe depletion  Clavicle Bone Region Severe depletion  Clavicle and Acromion Bone Region Severe depletion  Scapular Bone Region Severe depletion  Dorsal Hand Severe depletion  Patellar Region Severe depletion  Anterior Thigh Region Severe depletion  Posterior Calf Region Unable to assess  [wrapped]  Edema (RD Assessment) Mild  [BLE]  Hair Other (Comment)  [bald]  Eyes Reviewed  Mouth Reviewed  Skin Reviewed  Nails Reviewed    Diet Order:   Diet Order             Diet 2 gram sodium Room service appropriate? Yes; Fluid consistency: Thin  Diet effective now                   EDUCATION NEEDS:   Education needs have been addressed  Skin:  Skin Assessment: Reviewed RN Assessment  Last BM:  PTA  Height:   Ht Readings from Last 1 Encounters:  01/24/24 6' (1.829 m)    Weight:   Wt Readings from Last 1 Encounters:  01/25/24 64.6 kg    Ideal Body Weight:  80.91 kg  BMI:  Body mass index is 19.32 kg/m.  Estimated Nutritional Needs:   Kcal:  2000-2200  Protein:  75-95g  Fluid:  1.8-2L    Josette Glance, MS, RDN, LDN Clinical Dietitian I Please reach out via secure chat

## 2024-01-25 NOTE — Progress Notes (Addendum)
 PHARMACY - ANTICOAGULATION CONSULT NOTE  Pharmacy Consult for heparin  Indication: atrial fibrillation  Allergies[1]  Patient Measurements: Height: 6' (182.9 cm) Weight: 64.6 kg (142 lb 6.7 oz) IBW/kg (Calculated) : 77.6 HEPARIN  DW (KG): 68  Vital Signs: Temp: 97 F (36.1 C) (01/15 0800) Temp Source: Core (01/15 0800) BP: 74/53 (01/15 0800) Pulse Rate: 77 (01/15 0800)  Labs: Recent Labs    01/24/24 0715 01/24/24 0916 01/24/24 2205 01/25/24 0454 01/25/24 1011  HGB  --  12.9*  --   --  12.2*  HCT  --  38.0*  --   --  38.0*  PLT  --   --   --   --  193  APTT  --   --  60* 98* 66*  HEPARINUNFRC  --   --  0.83* 0.83*  --   CREATININE 1.34*  --   --  1.16 1.20    Estimated Creatinine Clearance: 53.8 mL/min (by C-G formula based on SCr of 1.2 mg/dL).  Assessment: 39 yom presenting with low cardiac output and elevated filling pressures on RHC for diuresis and milrinone . On apixaban  PTA for hx Afib (LD 1/13 AM).    Heparin  level still falsely elevated on AM labs. Confirmatory aPTT therapeutic at 66. Per RN, no issues with heparin  infusion running continuously or signs/symptoms of bleeding. CBC stable, PLT 193, Hgb 12.2.   Goal of Therapy:  Heparin  level 0.3-0.7 units/ml aPTT 66-102 seconds Monitor platelets by anticoagulation protocol: Yes   Plan:  Continue heparin  infusion at 1050 units/hr Monitor daily HL/aPTT for correlation Daily CBC Continue monitoring for s/sx bleeding   Thank you for allowing pharmacy to participate in this patient's care,  Vermell Mccallum, PharmD 01/25/2024 12:31 PM           [1]  Allergies Allergen Reactions   Bovine (Beef) Protein-Containing Drug Products    Porcine (Pork) Protein-Containing Drug Products Other (See Comments)    Religious observance

## 2024-01-25 NOTE — Progress Notes (Addendum)
 "    Advanced Heart Failure Rounding Note  Cardiologist: [name], MD  [facility] Cardiologist: Dr. Cherrie Chief Complaint: End-stage HF Patient Profile   Garrett Roberts is a 69 y.o. male with CAD, DM II, HTN, HLD, ischemic cardiomyopathy/chronic systolic CHF.   Significant events:   1/14: RHC: RA 13, PA 39/18 (26), PCW 20, TD CO/CI 2.81/1.49, PAPi  Subjective:    Co-ox Soft BP. Co-ox pending. On milrinone  0.375. 5L UOP (net -3.8L).   Swan#s: PAP: (36-64)/(14-28) 48/24 CVP:  [4 mmHg-16 mmHg] 7 mmHg CO:  [2.6 L/min-3.7 L/min] 3.6 L/min CI:  [1.39 L/min/m2-1.97 L/min/m2] 1.94 L/min/m2 PCWP 26  Objective:    Weight Range: 64.6 kg Body mass index is 19.32 kg/m.   Vital Signs:   Temp:  [96.3 F (35.7 C)-97.7 F (36.5 C)] 97 F (36.1 C) (01/15 0800) Pulse Rate:  [70-86] 77 (01/15 0800) Resp:  [0-25] 9 (01/15 0800) BP: (74-122)/(48-88) 74/53 (01/15 0800) SpO2:  [89 %-100 %] 99 % (01/15 0800) Weight:  [64.6 kg] 64.6 kg (01/15 0500) Last BM Date :  (PTA)  Weight change: Filed Weights   01/24/24 0653 01/25/24 0500  Weight: 68 kg 64.6 kg   Intake/Output:  Intake/Output Summary (Last 24 hours) at 01/25/2024 0934 Last data filed at 01/25/2024 0800 Gross per 24 hour  Intake 945.9 ml  Output 4975 ml  Net -4029.1 ml    Physical Exam   General: Thin, elderly appearing. No distress on RA Cardiac: JVP ~8cm. Mechanical heart sounds with LVAD hum present.  Driveline: Dressing C/D/I. No drainage or redness. Anchor in place. Extremities: Warm and dry. 1+ edema. Neuro: Alert and oriented x3. Affect pleasant. Moves all extremities without difficulty.  Telemetry   SR 70s w frequent PVCs (personally reviewed)  Labs   CBC Recent Labs    01/24/24 0916  HGB 12.9*  HCT 38.0*   Basic Metabolic Panel Recent Labs    98/85/73 0715 01/24/24 0916 01/25/24 0454  NA 139 140 137  K 4.5 3.5 2.9*  CL 100  --  97*  CO2 28  --  30  GLUCOSE 158*  --  120*  BUN  29*  --  23  CREATININE 1.34*  --  1.16  CALCIUM  9.3  --  8.4*   Liver Function Tests Recent Labs    01/23/24 1118  AST 22  ALT 15  ALKPHOS 167*  BILITOT 0.7  PROT 7.5  ALBUMIN  3.9   BNP (last 3 results) Recent Labs    07/19/23 1333 08/25/23 1125 10/20/23 1325  BNP 1,374.0* 1,477.8* 1,886.7*   ProBNP (last 3 results) Recent Labs    01/17/24 1236 01/23/24 1118  PROBNP 1,945.0* 3,357.0*   Medications:    Scheduled Medications:  amiodarone   100 mg Oral Daily   Chlorhexidine  Gluconate Cloth  6 each Topical Daily   digoxin   0.125 mg Oral Daily   empagliflozin   25 mg Oral Daily   ezetimibe   10 mg Oral Daily   levothyroxine   112 mcg Oral Daily   midodrine   5 mg Oral TID WC   pantoprazole   40 mg Oral BID AC   potassium chloride   40 mEq Oral Q6H   rosuvastatin   40 mg Oral Daily   sodium chloride  flush  3 mL Intravenous Q12H   tadalafil   20 mg Oral Daily   Vericiguat   5 mg Oral Daily    Infusions:  sodium chloride  10 mL/hr at 01/25/24 0800   sodium chloride      sodium  chloride 10 mL/hr at 01/25/24 0800   sodium chloride  10 mL/hr at 01/25/24 0800   heparin  1,050 Units/hr (01/25/24 0800)   milrinone  0.375 mcg/kg/min (01/25/24 0800)   potassium chloride       PRN Medications: sodium chloride , sodium chloride , acetaminophen , ondansetron  (ZOFRAN ) IV, mouth rinse, sodium chloride  flush  Assessment/Plan   1. Acute on chronic systolic CHF/ischemic cardiomyopathy/low output HF:  Biotronik ICD.  Echo 12/24 with EF 20-25%, no LV thrombus, AK mid septum to apex, moderately reduced RV systolic function. Echo 2/25 with EF 25%, mildly reduced RV function.  Low BP has limited GDMT titration.  His profound fatigue has been concerning for low output HF. RHC 3/25 showed low CI by thermo (2.05) but preserved by Fick.  Borderline elevated PCWP, normal RA pressure.  There was also moderate mixed pulmonary arterial/pulmonary venous hypertension.  With his significant symptoms, he was  started on milrinone  0.25 and added sildenafil  to try to lower PA pressure.  Milrinone  was later weaned off with medication adjustment, and co-ox remained excellent.  He was readmitted in 6/25 with CHF; RHC showed moderate mixed pulmonary arterial/pulmonary venous hypertension with CI low at 2.1 by Fick and thermo.  Echo in 6/25 showed EF 20-25%, mild LV dilation, moderate RV dysfunction, normal RV size, PASP 44 mmHg, mild-moderate MR.  We have had to peel back on GDMT due to low BP and he is now taking midodrine .  With low output heart failure (CI 2.1 on 6/25 RHC and inability to tolerate GDMT - now on midodrine ), we have been discussing advanced therapies.  He is not interested in transplant and at his age, it would be more difficult to obtain. I think he would be a reasonable LVAD candidate. We have a window at this time while his RV function is not severely depressed and his renal function is relatively preserved.  He has completed workup.  We had him set up for LVAD surgery, but he decided that he wanted to wait longer (daughter getting married).  At appointment last week, he had gained 17 lbs and was significantly volume overloaded with NYHA class III symptoms.  I gave him Furoscix  over the weekend and increased his torsemide  after that, but at appointment yesterday, he was up another 4 lbs. Unfortunately, he appears to have failed outpatient diuresis.  RHC 1/14, showing low cardiac output by Fick and thermodilution and elevated filling pressures.  PAPi low at 1.6 but not markedly low.  Remain concerned that we missed his window for LVAD implantation.  - Echo 1/14 showed EF 20-25%, G3DD, mildly reduced RV, mild MR - PAC in place; hemodynamics as above. PAPi improved with diuresis, PCW on PAC 26. - Co-ox pending. On Milrinone  0.375 mcg/kg/min. - CVP 8-9. Continue Lasix  12/h - Continue Jardiance  10 mg daily (hold if he decides on LVAD). - Continue digoxin  0.125 daily. - Stop midodrine . Stop Veroquvo,  restart at discharge if needed.  - off Toprol  XL with low output.  - Off spironolactone  with low BP.  - We have discussed LVAD extensively, daughter present. Optimize his volume status and then get LVAD surgery arranged, ideally this admission.  He still seems on the fence about the device.  Continue discussion and will have surgery come by to see him again.   2. CAD:  Hx inferior STEMI 10/23 s/p PCI/DES to RCA. Anterior STEMI 12/10/22, cath showed occluded pLAD treated with PCI/DES, prior RCA stent patent, severe disease OM1 and distal LCx treated medically. No chest pain.  - >  1 year post-PCI, will hold Plavix  pre-potential LVAD.   - No ASA given Eliquis  use.  - Continue rosuvastatin  40 mg + Zetia  10 mg daily.  LDL above goal < 55 in 11/25, would ideally get on Repatha but for now will focus on preparation for LVAD.    3. Atrial fibrillation: Paroxysmal. Has been in NSR.  - Heparin  gtt for now, has been on apixaban  at home.  - Continue amiodarone  100 mg daily, recent LFTs and TSH normal.   4. DM II: Continue Jardiance  (hold if LVAD surgery set up).   5. 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 Not a smoker, no known lung disease. CXR has shown some chronic interstitial changes. HRCT chest with bibasilar bronchiectasis that may be due to aspiration (though swallow study negative).  V/Q scan showed no chronic PE. Rheumatologic serologies negative. RHC today with improved PA pressure, only mild pulmonary venous hypertension.  - Continue tadalafil  20 mg daily.    6. Pleural effusion: S/p right thoracentesis 3/25 and again in 11/25. Pleural fluid was transudative by Light's criteria, no malignant cells. Suspect he has reaccumulated pleural fluid.   - CXR with lrg R plerual effusion - repeat CXR tomorrow after diuresed further; may need tap  7. GI: History of early satiety, GI discomfort.  CT abdomen/pelvis with gastric wall thickening.  GI was consulted and EGD was performed, no definite evidence for malignancy, biopsies taken for H pylori testing and pathology => negative for H pylori and no gastric cancer.  Gastric emptying study normal in 9/25.  Abdominal US  showed normal appearing liver in 9/25.  Virtual colonoscopy unremarkable.  He actually is feeling better with improved appetite.   8. 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  - Continue Levoxyl , recent TSH ok.   9. Fe deficiency anemia: He has had IV FE.   CRITICAL CARE Performed by: Jordan Lee  Total critical care time: 8 minutes  -Critical care time was exclusive of separately billable procedures and treating other patients. -Critical care was necessary to treat or prevent imminent or life-threatening deterioration. -Critical care was time spent personally by me on the following activities: development of treatment plan with patient and/or surrogate as well as nursing, discussions with consultants, evaluation of patient's response to treatment, examination of patient, obtaining history from patient or surrogate, ordering and performing treatments and interventions, ordering and review of laboratory studies, ordering and review of radiographic studies, pulse oximetry and re-evaluation of patient's condition.  Length of Stay: 1  Jordan Lee, NP  01/25/2024, 9:34 AM  Advanced Heart Failure Team Pager [phone] (M-F; 7a - 5p)   Please visit [email or web address]: For overnight coverage please call cardiology fellow first. If fellow not available call Shock/ECMO MD on call.  For ECMO / Mechanical Support (Impella, IABP, LVAD) issues call Shock / ECMO MD on call.    Agree with above.   Diuresing well on milrinone  and IV lasix .   Co-ox 63%.  Rhythm stable. Breathing better  Swan numbers done personally  PAP: (36-70)/(14-44) 65/38 CVP:  [1 mmHg-16 mmHg] 12 mmHg CO:  [3.6 L/min-3.7 L/min] 3.6 L/min CI:  [1.94 L/min/m2-1.97 L/min/m2] 1.94 L/min/m2 PCWP 25 with  prominent v waves  General:  Sitting up in bed. No resp difficulty thin and frail HEENT: normal Neck: supple. RIJ swan  Cor: Regular rate & rhythm. No rubs, gallops or murmurs. Lungs: clear Abdomen: soft, nontender, nondistended.Good bowel sounds. Extremities: no cyanosis, clubbing, rash, 1+ edema + UNNA boots Neuro:  alert & orientedx3, cranial nerves grossly intact. moves all 4 extremities w/o difficulty. Affect pleasant  Responding well to milrinone  and IV diuresis but very tenuous.   Agree with need for LVAD and I think his RV will tolerate like. However he is quite small and frail and has lost 40 pounds over the last year. I discussed these concerns with him and his daughter as well as Dr. Graysen Woodyard.   Dr. Layliana Devins will evaluate later today to see if he is VAD candidate.  Supp K aggressively.   CRITICAL CARE Performed by: Cherrie Sieving  Total critical care time: 40 minutes  Critical care time was exclusive of separately billable procedures and treating other patients.  Critical care was necessary to treat or prevent imminent or life-threatening deterioration.  Critical care was time spent personally by me (independent of midlevel providers or residents) on the following activities: development of treatment plan with patient and/or surrogate as well as nursing, discussions with consultants, evaluation of patient's response to treatment, examination of patient, obtaining history from patient or surrogate, ordering and performing treatments and interventions, ordering and review of laboratory studies, ordering and review of radiographic studies, pulse oximetry and re-evaluation of patient's condition.  Sieving Cherrie, MD  2:59 PM   "

## 2024-01-26 ENCOUNTER — Encounter (HOSPITAL_COMMUNITY): Payer: Self-pay | Admitting: Cardiology

## 2024-01-26 ENCOUNTER — Inpatient Hospital Stay (HOSPITAL_COMMUNITY)

## 2024-01-26 DIAGNOSIS — I5020 Unspecified systolic (congestive) heart failure: Secondary | ICD-10-CM

## 2024-01-26 DIAGNOSIS — I255 Ischemic cardiomyopathy: Secondary | ICD-10-CM

## 2024-01-26 LAB — HEPARIN LEVEL (UNFRACTIONATED): Heparin Unfractionated: 0.7 [IU]/mL (ref 0.30–0.70)

## 2024-01-26 LAB — CBC
HCT: 39.5 % (ref 39.0–52.0)
Hemoglobin: 12.9 g/dL — ABNORMAL LOW (ref 13.0–17.0)
MCH: 27.8 pg (ref 26.0–34.0)
MCHC: 32.7 g/dL (ref 30.0–36.0)
MCV: 85.1 fL (ref 80.0–100.0)
Platelets: 226 K/uL (ref 150–400)
RBC: 4.64 MIL/uL (ref 4.22–5.81)
RDW: 18 % — ABNORMAL HIGH (ref 11.5–15.5)
WBC: 6.8 K/uL (ref 4.0–10.5)
nRBC: 0 % (ref 0.0–0.2)

## 2024-01-26 LAB — COOXEMETRY PANEL
Carboxyhemoglobin: 1.6 % — ABNORMAL HIGH (ref 0.5–1.5)
Methemoglobin: <0.7 % (ref 0.0–1.5)
O2 Saturation: 78.5 %
Total hemoglobin: 13.2 g/dL (ref 12.0–16.0)

## 2024-01-26 LAB — BASIC METABOLIC PANEL WITH GFR
Anion gap: 9 (ref 5–15)
BUN: 22 mg/dL (ref 8–23)
CO2: 31 mmol/L (ref 22–32)
Calcium: 8.9 mg/dL (ref 8.9–10.3)
Chloride: 97 mmol/L — ABNORMAL LOW (ref 98–111)
Creatinine, Ser: 1.3 mg/dL — ABNORMAL HIGH (ref 0.61–1.24)
GFR, Estimated: 60 mL/min — ABNORMAL LOW
Glucose, Bld: 167 mg/dL — ABNORMAL HIGH (ref 70–99)
Potassium: 3.6 mmol/L (ref 3.5–5.1)
Sodium: 137 mmol/L (ref 135–145)

## 2024-01-26 LAB — APTT: aPTT: 111 s — ABNORMAL HIGH (ref 24–36)

## 2024-01-26 MED ORDER — MILRINONE LACTATE IN DEXTROSE 20-5 MG/100ML-% IV SOLN
0.1250 ug/kg/min | INTRAVENOUS | Status: DC
  Administered 2024-01-26 – 2024-01-30 (×5): 0.25 ug/kg/min via INTRAVENOUS
  Filled 2024-01-26 (×6): qty 100

## 2024-01-26 MED ORDER — POTASSIUM CHLORIDE CRYS ER 20 MEQ PO TBCR
40.0000 meq | EXTENDED_RELEASE_TABLET | Freq: Once | ORAL | Status: AC
  Administered 2024-01-26: 40 meq via ORAL
  Filled 2024-01-26: qty 2

## 2024-01-26 MED ORDER — MILRINONE LACTATE IN DEXTROSE 20-5 MG/100ML-% IV SOLN: 0.2500 ug/kg/min | INTRAVENOUS | Status: DC

## 2024-01-26 MED ORDER — DOCUSATE SODIUM 100 MG PO CAPS
100.0000 mg | ORAL_CAPSULE | Freq: Every day | ORAL | Status: DC
  Administered 2024-01-26 – 2024-01-30 (×3): 100 mg via ORAL
  Filled 2024-01-26 (×5): qty 1

## 2024-01-26 NOTE — Progress Notes (Addendum)
 "    Advanced Heart Failure Rounding Note  Cardiologist: [name], MD  [facility] Cardiologist: Dr. Cherrie Chief Complaint: End-stage HF Patient Profile   Garrett Roberts is a 69 y.o. male with CAD, DM II, HTN, HLD, ischemic cardiomyopathy/chronic systolic CHF.   Significant events:   1/14: RHC: RA 13, PA 39/18 (26), PCW 20, TD CO/CI 2.81/1.49, PAPi  Subjective:    Co-ox 79%. Soft BP. Co-ox pending. On milrinone  0.375. 5L UOP (net -3.8L).   Swan#s: PAP: (30-70)/(7-44) 55/23 CVP:  [1 mmHg-15 mmHg] <5 mmHg CO:  [5.6 L/min] 5.6 L/min  Feeling good this morning. Has been walking around the unit without any issues. Denies SOB.   Objective:    Weight Range: 64.7 kg Body mass index is 19.35 kg/m.   Vital Signs:   Temp:  [95 F (35 C)-98.2 F (36.8 C)] 97.3 F (36.3 C) (01/16 0615) Pulse Rate:  [69-95] 86 (01/16 0615) Resp:  [0-31] 16 (01/16 0615) BP: (74-122)/(47-94) 105/73 (01/16 0615) SpO2:  [90 %-100 %] 100 % (01/16 0615) Weight:  [64.7 kg] 64.7 kg (01/16 0515) Last BM Date :  (PTA)  Weight change: Filed Weights   01/24/24 0653 01/25/24 0500 01/26/24 0515  Weight: 68 kg 64.6 kg 64.7 kg   Intake/Output:  Intake/Output Summary (Last 24 hours) at 01/26/2024 0732 Last data filed at 01/26/2024 0728 Gross per 24 hour  Intake 1719.67 ml  Output 4900 ml  Net -3180.33 ml    Physical Exam   General:  elderly/frail appearing.  No respiratory difficulty Neck: JVD ~5 cm.  Cor: Regular rate & rhythm. No murmurs. Lungs: clear Extremities: no edema. +UNNA boots  Neuro: alert & oriented x 3. Affect pleasant.   Telemetry   SR 80s w frequent PVCs (personally reviewed)  Labs   CBC Recent Labs    01/25/24 1011 01/26/24 0512  WBC 5.7 6.8  HGB 12.2* 12.9*  HCT 38.0* 39.5  MCV 86.8 85.1  PLT 193 226   Basic Metabolic Panel Recent Labs    98/84/73 1011 01/25/24 1455 01/25/24 1456 01/26/24 0512  NA 134* 134*  --  137  K 3.3* 4.6  --  3.6  CL 96*  97*  --  97*  CO2 30 28  --  31  GLUCOSE 220* 219*  --  167*  BUN 22 24*  --  22  CREATININE 1.20 1.24  --  1.30*  CALCIUM  8.3* 8.5*  --  8.9  MG 2.0  --  2.0  --   PHOS 2.8 2.9  --   --    Liver Function Tests Recent Labs    01/23/24 1118 01/25/24 1011 01/25/24 1455  AST 22  --   --   ALT 15  --   --   ALKPHOS 167*  --   --   BILITOT 0.7  --   --   PROT 7.5  --   --   ALBUMIN  3.9 3.3* 3.5   BNP (last 3 results) Recent Labs    07/19/23 1333 08/25/23 1125 10/20/23 1325  BNP 1,374.0* 1,477.8* 1,886.7*   ProBNP (last 3 results) Recent Labs    01/17/24 1236 01/23/24 1118 01/25/24 1456  PROBNP 1,945.0* 3,357.0* 2,392.0*   Medications:    Scheduled Medications:  amiodarone   100 mg Oral Daily   Chlorhexidine  Gluconate Cloth  6 each Topical Daily   digoxin   0.125 mg Oral Daily   empagliflozin   25 mg Oral Daily   ezetimibe   10 mg Oral  Daily   feeding supplement  237 mL Oral TID BM   levothyroxine   112 mcg Oral Daily   pantoprazole   40 mg Oral BID AC   potassium chloride   40 mEq Oral Once   rosuvastatin   40 mg Oral Daily   sodium chloride  flush  3 mL Intravenous Q12H   tadalafil   20 mg Oral Daily    Infusions:  sodium chloride  10 mL/hr at 01/26/24 0500   sodium chloride  10 mL/hr at 01/26/24 0500   sodium chloride  10 mL/hr at 01/26/24 0500   sodium chloride      furosemide  (LASIX ) 200 mg in dextrose  5 % 100 mL (2 mg/mL) infusion 12 mg/hr (01/26/24 0500)   heparin  1,050 Units/hr (01/26/24 0500)   milrinone  0.375 mcg/kg/min (01/26/24 0500)    PRN Medications: sodium chloride , sodium chloride , acetaminophen , ondansetron  (ZOFRAN ) IV, mouth rinse, sodium chloride  flush  Assessment/Plan   1. Acute on chronic systolic CHF/ischemic cardiomyopathy/low output HF:  Biotronik ICD.  Echo 12/24 with EF 20-25%, no LV thrombus, AK mid septum to apex, moderately reduced RV systolic function. Echo 2/25 with EF 25%, mildly reduced RV function.  Low BP has limited GDMT  titration.  His profound fatigue has been concerning for low output HF. RHC 3/25 showed low CI by thermo (2.05) but preserved by Fick.  Borderline elevated PCWP, normal RA pressure.  There was also moderate mixed pulmonary arterial/pulmonary venous hypertension.  With his significant symptoms, he was started on milrinone  0.25 and added sildenafil  to try to lower PA pressure.  Milrinone  was later weaned off with medication adjustment, and co-ox remained excellent.  He was readmitted in 6/25 with CHF; RHC showed moderate mixed pulmonary arterial/pulmonary venous hypertension with CI low at 2.1 by Fick and thermo.  Echo in 6/25 showed EF 20-25%, mild LV dilation, moderate RV dysfunction, normal RV size, PASP 44 mmHg, mild-moderate MR.  We have had to peel back on GDMT due to low BP and he is now taking midodrine .  With low output heart failure (CI 2.1 on 6/25 RHC and inability to tolerate GDMT - now on midodrine ), we have been discussing advanced therapies.  He is not interested in transplant and at his age, it would be more difficult to obtain. I think he would be a reasonable LVAD candidate. We have a window at this time while his RV function is not severely depressed and his renal function is relatively preserved.  He has completed workup.  We had him set up for LVAD surgery, but he decided that he wanted to wait longer (daughter getting married).  At appointment last week, he had gained 17 lbs and was significantly volume overloaded with NYHA class III symptoms.  Got Furoscix  over the weekend and increased his torsemide  after that, but at appointment, he was up another 4 lbs. Unfortunately, he appears to have failed outpatient diuresis.  RHC 1/14, showing low cardiac output by Fick and thermodilution and elevated filling pressures.  PAPi low at 1.6 but not markedly low.  Remain concerned that we missed his window for LVAD implantation.  - Echo 1/14 showed EF 20-25%, G3DD, mildly reduced RV, mild MR - PAC in  place; hemodynamics as above. PAPi improved with diuresis, PCW on PAC 26. - Co-ox 79%. On Milrinone  0.375 mcg/kg/min. - CVP <5 and small bump in SCr. Stop lasix  gtt. Will reevaluate this afternoon, will probably start Torsemide  60 mg BID.  - Continue Jardiance  25 mg daily (hold if he decides on LVAD). - Continue digoxin  0.125  daily. - Off midodrine  and Veroquvo, restart at discharge if needed.  - off Toprol  XL with low output.  - Off spironolactone  with low BP.  - We have discussed LVAD extensively, daughter present. Optimize his volume status and then get LVAD surgery arranged, ideally this admission.  He still seems on the fence about the device.  Continue discussion and will have surgery come by to see him again. Family asked [facility] to come back today as they were too overwhelmed yesterday with information.    2. CAD:  Hx inferior STEMI 10/23 s/p PCI/DES to RCA. Anterior STEMI 12/10/22, cath showed occluded pLAD treated with PCI/DES, prior RCA stent patent, severe disease OM1 and distal LCx treated medically. No chest pain.  - > 1 year post-PCI, will hold Plavix  pre-potential LVAD.   - No ASA given Eliquis  use.  - Continue rosuvastatin  40 mg + Zetia  10 mg daily.  LDL above goal < 55 in 11/25, would ideally get on Repatha but for now will focus on preparation for LVAD.    3. Atrial fibrillation: Paroxysmal. Has been in NSR.  - Heparin  gtt for now, has been on apixaban  at home.  - Continue amiodarone  100 mg daily, recent LFTs and TSH normal.   4. DM II: Continue Jardiance  (hold if LVAD surgery set up).   5. 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 Not a smoker, no known lung disease. CXR has shown some chronic interstitial changes. HRCT chest with bibasilar bronchiectasis that may be due to aspiration (though swallow study negative).  V/Q scan showed no chronic PE. Rheumatologic serologies negative. RHC 1/14 with improved PA pressure,  only mild pulmonary venous hypertension.  - Continue tadalafil  20 mg daily.    6. Pleural effusion: S/p right thoracentesis 3/25 and again in 11/25. Pleural fluid was transudative by Light's criteria, no malignant cells. Suspect he has reaccumulated pleural fluid.   - CXR with lrg R plerual effusion - CXR today with stable small pleural effusion  7. GI: History of early satiety, GI discomfort.  CT abdomen/pelvis with gastric wall thickening. GI was consulted and EGD was performed, no definite evidence for malignancy, biopsies taken for H pylori testing and pathology => negative for H pylori and no gastric cancer.  Gastric emptying study normal in 9/25.  Abdominal US  showed normal appearing liver in 9/25.  Virtual colonoscopy unremarkable.  He actually is feeling better with improved appetite.   8. 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  - Continue Levoxyl , recent TSH ok.   9. Fe deficiency anemia: He has had IV FE.   CRITICAL CARE Performed by: Garrett Roberts  Total critical care time: 9 minutes  -Critical care time was exclusive of separately billable procedures and treating other patients. -Critical care was necessary to treat or prevent imminent or life-threatening deterioration. -Critical care was time spent personally by me on the following activities: development of treatment plan with patient and/or surrogate as well as nursing, discussions with consultants, evaluation of patient's response to treatment, examination of patient, obtaining history from patient or surrogate, ordering and performing treatments and interventions, ordering and review of laboratory studies, ordering and review of radiographic studies, pulse oximetry and re-evaluation of patient's condition.  Length of Stay: 2  Garrett LITTIE Coe, NP  01/26/2024, 7:32 AM  Advanced Heart Failure Team Pager [phone] (M-F; 7a - 5p)   Please visit [email or web address]: For overnight coverage please call cardiology fellow first. If  fellow not available call Shock/ECMO MD  on call.  For ECMO / Mechanical Support (Impella, IABP, LVAD) issues call Shock / ECMO MD on call.   Patient seen and examined with the above-signed Advanced Practice Provider and/or Housestaff. I personally reviewed laboratory data, imaging studies and relevant notes. I independently examined the patient and formulated the important aspects of the plan. I have edited the note to reflect any of my changes or salient points. I have personally discussed the plan with the patient and/or family.  See my progress note from earlier this am for full details.   Garrett Fuel, MD  9:14 PM  "

## 2024-01-26 NOTE — Consult Note (Signed)
 "   [address], [place] [address]             [phone]    [facility] Medical Record #[id number] Date of Birth: [birth year]  Referring: No ref. provider found Primary Care: Vincente Shivers, NP Primary Cardiologist:Manish JINNY Lawrence, MD  Chief Complaint:   No chief complaint on file.   History of Present Illness:     Kayode Hernandes is a 69 y.o. male who was admitted on Wednesday with acute decompensated heart failure and about 20 lb weight gain.  He has a history of ischemic cardiomyopathy dating back to 2024.  He was previously evaluated for LVAD by Dr. Lucas in June 2025, he was scheduled for surgery but then decided to hold off.  He was being followed closely as an outpatient by Dr. Rolan but at his last clinic visit, was found to be severely volume overloaded and in acute decompensated heart failure.  RHC demonstrated RA 13, PA mean 26, PCW 20, CI 1.49, PAPi 1.7.  He was admitted to [facility] and started on milrinone  and Lasix  gtt.  He is now down almost 20 pounds since admission.  I had a long discussion with Mr. Covington and his family including his wife, 2 daughters and son.  His reluctance around LVAD implantation revolves around the fear of losing his independence as well as possible complications following surgery.  He feels pretty well now that he has been diuresed and does not feel like he needs any intervention at this time since he is feeling good.  He has had significant issues with malnutrition and poor oral intake.  There was a period of time where his family describes that he had just given up, was not doing anything active and was refusing to eat.  They say he has recovered well from that, but are concerned that this would happen again if he were to have a difficult postoperative course.  His albumin  on admission was 3.5, prealbumin is 10.  In general, he is a very functional and independent man.  He feeds and bathes  himself and enjoys driving and seeing friends/family.  He is quite reluctant to proceed with surgery at this time.   Creatinine:  Lab Results  Component Value Date   CREATININE 1.30 (H) 01/26/2024   CREATININE 1.24 01/25/2024   CREATININE 1.20 01/25/2024     Past Medical History:  Diagnosis Date   Acute ST elevation myocardial infarction (STEMI) of anterior wall (HCC) 12/10/2022   Acute ST elevation myocardial infarction (STEMI) of inferior wall (HCC) 10/30/2021   Atrial fibrillation (HCC)    Atrial fibrillation with RVR (HCC) 12/19/2022   CHF (congestive heart failure) (HCC)    Coronary artery disease    Diabetes mellitus without complication (HCC)    DM (diabetes mellitus) (HCC)    H. pylori infection    Heart attack (HCC)    HLD (hyperlipidemia)    Hypertension    Hypothyroidism    ST elevation myocardial infarction involving left anterior descending (LAD) coronary artery (HCC) 12/10/2022    Past Surgical History:  Procedure Laterality Date   CARDIOVERSION N/A 12/14/2022   Procedure: CARDIOVERSION;  Surgeon: Kate Lonni CROME, MD;  Location: [facility] INVASIVE CV LAB;  Service: Cardiovascular;  Laterality: N/A;   CORONARY/GRAFT ACUTE MI REVASCULARIZATION N/A 10/30/2021   Procedure: Coronary/Graft Acute MI Revascularization;  Surgeon: Ladona Heinz, MD;  Location: [facility] INVASIVE CV LAB;  Service: Cardiovascular;  Laterality: N/A;  CORONARY/GRAFT ACUTE MI REVASCULARIZATION N/A 12/10/2022   Procedure: Coronary/Graft Acute MI Revascularization;  Surgeon: Wonda Sharper, MD;  Location: [facility] INVASIVE CV LAB;  Service: Cardiovascular;  Laterality: N/A;   ESOPHAGOGASTRODUODENOSCOPY N/A 03/23/2023   Procedure: EGD (ESOPHAGOGASTRODUODENOSCOPY);  Surgeon: Stacia Glendia BRAVO, MD;  Location: [facility] ENDOSCOPY;  Service: Gastroenterology;  Laterality: N/A;   ICD IMPLANT N/A 06/01/2023   Procedure: ICD IMPLANT;  Surgeon: Kennyth Chew, MD;  Location: [facility] INVASIVE CV LAB;  Service: Cardiovascular;   Laterality: N/A;   IR THORACENTESIS RIGHT ASP PLEURAL SPACE W/IMG GUIDE  11/28/2023   LEFT HEART CATH AND CORONARY ANGIOGRAPHY N/A 10/30/2021   Procedure: LEFT HEART CATH AND CORONARY ANGIOGRAPHY;  Surgeon: Ladona Heinz, MD;  Location: [facility] INVASIVE CV LAB;  Service: Cardiovascular;  Laterality: N/A;   LEFT HEART CATH AND CORONARY ANGIOGRAPHY N/A 12/10/2022   Procedure: LEFT HEART CATH AND CORONARY ANGIOGRAPHY;  Surgeon: Wonda Sharper, MD;  Location: [facility] INVASIVE CV LAB;  Service: Cardiovascular;  Laterality: N/A;   RIGHT HEART CATH N/A 03/20/2023   Procedure: RIGHT HEART CATH;  Surgeon: Rolan Ezra RAMAN, MD;  Location: [facility] INVASIVE CV LAB;  Service: Cardiovascular;  Laterality: N/A;   RIGHT HEART CATH N/A 06/30/2023   Procedure: RIGHT HEART CATH;  Surgeon: Cherrie Toribio SAUNDERS, MD;  Location: [facility] INVASIVE CV LAB;  Service: Cardiovascular;  Laterality: N/A;   RIGHT HEART CATH N/A 01/24/2024   Procedure: RIGHT HEART CATH;  Surgeon: Rolan Ezra RAMAN, MD;  Location: [facility] INVASIVE CV LAB;  Service: Cardiovascular;  Laterality: N/A;    Social History:  Tobacco Use History[1]  Social History   Substance and Sexual Activity  Alcohol Use No     Allergies[2]  Current Facility-Administered Medications  Medication Dose Route Frequency Provider Last Rate Last Admin   0.9 %  sodium chloride  infusion (Manually program via Guardrails IV Fluids)   Intravenous PRN Lee, Jordan, NP       0.9 %  sodium chloride  infusion (Manually program via Guardrails IV Fluids)   Intravenous Continuous Lee, Jordan, NP 10 mL/hr at 01/26/24 1900 Infusion Verify at 01/26/24 1900   0.9 %  sodium chloride  infusion   Intravenous Continuous Bensimhon, Toribio SAUNDERS, MD 10 mL/hr at 01/26/24 1900 Infusion Verify at 01/26/24 1900   0.9 %  sodium chloride  infusion   Intravenous Continuous Bensimhon, Toribio SAUNDERS, MD 10 mL/hr at 01/26/24 1900 Infusion Verify at 01/26/24 1900   acetaminophen  (TYLENOL ) tablet 650 mg  650 mg Oral Q4H PRN McLean, Dalton  S, MD       amiodarone  (PACERONE ) tablet 100 mg  100 mg Oral Daily McLean, Dalton S, MD   100 mg at 01/26/24 9055   Chlorhexidine  Gluconate Cloth 2 % PADS 6 each  6 each Topical Daily Lee, Jordan, NP   6 each at 01/26/24 1000   digoxin  (LANOXIN ) tablet 0.125 mg  0.125 mg Oral Daily McLean, Dalton S, MD   0.125 mg at 01/26/24 0944   docusate sodium  (COLACE) capsule 100 mg  100 mg Oral Daily Bensimhon, Daniel R, MD   100 mg at 01/26/24 1615   empagliflozin  (JARDIANCE ) tablet 25 mg  25 mg Oral Daily McLean, Dalton S, MD   25 mg at 01/26/24 0944   ezetimibe  (ZETIA ) tablet 10 mg  10 mg Oral Daily McLean, Dalton S, MD   10 mg at 01/26/24 0944   feeding supplement (ENSURE PLUS HIGH PROTEIN) liquid 237 mL  237 mL Oral TID BM Bensimhon, Daniel R, MD   237 mL at  01/26/24 2019   heparin  ADULT infusion 100 units/mL (25000 units/250mL)  1,000 Units/hr Intravenous Continuous Bensimhon, Toribio SAUNDERS, MD 10 mL/hr at 01/26/24 1900 1,000 Units/hr at 01/26/24 1900   levothyroxine  (SYNTHROID ) tablet 112 mcg  112 mcg Oral Daily McLean, Dalton S, MD   112 mcg at 01/26/24 0515   milrinone  (PRIMACOR ) 20 MG/100 ML (0.2 mg/mL) infusion  0.25 mcg/kg/min (Order-Specific) Intravenous Continuous Hayes Lander L, NP 5.1 mL/hr at 01/26/24 1900 0.25 mcg/kg/min at 01/26/24 1900   ondansetron  (ZOFRAN ) injection 4 mg  4 mg Intravenous Q6H PRN McLean, Dalton S, MD       Oral care mouth rinse  15 mL Mouth Rinse PRN Bensimhon, Toribio SAUNDERS, MD       pantoprazole  (PROTONIX ) EC tablet 40 mg  40 mg Oral BID AC McLean, Dalton S, MD   40 mg at 01/26/24 1615   rosuvastatin  (CRESTOR ) tablet 40 mg  40 mg Oral Daily McLean, Dalton S, MD   40 mg at 01/26/24 9056   sodium chloride  flush (NS) 0.9 % injection 3 mL  3 mL Intravenous Q12H Rolan Ezra RAMAN, MD   3 mL at 01/26/24 1000   sodium chloride  flush (NS) 0.9 % injection 3 mL  3 mL Intravenous PRN Rolan Ezra RAMAN, MD       tadalafil  (CIALIS ) tablet 20 mg  20 mg Oral Daily McLean, Dalton S, MD   20 mg at  01/26/24 0944    Medications Prior to Admission  Medication Sig Dispense Refill Last Dose/Taking   acetaminophen  (TYLENOL ) 325 MG tablet Take 1-2 tablets (325-650 mg total) by mouth every 4 (four) hours as needed for mild pain (pain score 1-3).   Past Week   amiodarone  (PACERONE ) 200 MG tablet Take 1/2 tablet (100 mg total) by mouth daily. 30 tablet 5 01/23/2024   apixaban  (ELIQUIS ) 5 MG TABS tablet Take 1 tablet (5 mg total) by mouth 2 (two) times daily.   01/23/2024 at  8:00 PM   clopidogrel  (PLAVIX ) 75 MG tablet Take 1 tablet (75 mg total) by mouth daily. 90 tablet 3 01/23/2024   digoxin  (LANOXIN ) 0.125 MG tablet Take 1 tablet (0.125 mg total) by mouth daily. 90 tablet 3 01/23/2024   ezetimibe  (ZETIA ) 10 MG tablet Take 1 tablet (10 mg total) by mouth daily. 90 tablet 3 01/23/2024   Furosemide  (FUROSCIX ) 80 MG/10ML CTKT Inject 80 mg into the skin daily at 12 noon. Please inject Furoscix  today, Thursday, Friday and Saturday. Only use other Furoscix  as directed by HF clinic 10 each 0 01/22/2024   hyoscyamine  (LEVSIN ) 0.125 MG tablet Take 1 tablet (0.125 mg total) by mouth every 6 (six) hours as needed for cramping. 30 tablet 3 Past Week   JARDIANCE  25 MG TABS tablet TAKE ONE TABLET BY MOUTH DAILY 90 tablet 2 01/23/2024   levothyroxine  (SYNTHROID ) 112 MCG tablet Take 1 tablet (112 mcg total) by mouth daily. 90 tablet 1 01/23/2024   linaclotide  (LINZESS ) 145 MCG CAPS capsule Take 1 capsule (145 mcg total) by mouth daily before breakfast. Lot: [id number], exp: 03-2024 (Patient taking differently: Take 145 mcg by mouth daily as needed. Lot: [id number], exp: 03-2024) 12 capsule 0 Past Month   melatonin 5 MG TABS Take 5 mg by mouth at bedtime as needed (sleep).   Past Week   midodrine  (PROAMATINE ) 5 MG tablet Take 1 tablet (5 mg total) by mouth 3 (three) times daily with meals. 90 tablet 0 Past Week   pantoprazole  (PROTONIX ) 40 MG tablet Take 1  tablet (40 mg total) by mouth 2 (two) times daily before a meal. 180 tablet  3 01/23/2024   potassium chloride  SA (KLOR-CON  M) 20 MEQ tablet Take 2 tablets (40 mEq total) by mouth 2 (two) times daily. 180 tablet 6 01/23/2024   rosuvastatin  (CRESTOR ) 40 MG tablet Take 40 mg by mouth daily.   01/23/2024   tadalafil  (CIALIS ) 20 MG tablet Take 1 tablet (20 mg total) by mouth daily. 90 tablet 3 01/23/2024   torsemide  (DEMADEX ) 20 MG tablet Take 3 tablets (60 mg total) by mouth 2 (two) times daily. 180 tablet 6 01/23/2024   Vericiguat  (VERQUVO ) 5 MG TABS Take 1 tablet (5 mg total) by mouth daily. 30 tablet 3 01/23/2024   Vitamin D-Vitamin K (VITAMIN K2-VITAMIN D3 PO) Take 1 tablet by mouth daily with lunch.   Past Week    Family History  Problem Relation Age of Onset   Diabetes Mother    Chronic Renal Failure Father    Diabetes Father    Chronic Renal Failure Brother    Diabetes Brother    Diabetes Brother    Heart attack Brother    Stroke Brother     Physical Exam: BP (!) 96/53   Pulse 83   Temp 98.4 F (36.9 C)   Resp (!) 24   Ht 6' (1.829 m)   Wt 64.7 kg   SpO2 99%   BMI 19.35 kg/m  Physical Exam Constitutional:      Comments: Thin but not extremely frail.  Cardiovascular:     Rate and Rhythm: Normal rate.     Pulses: Normal pulses.  Pulmonary:     Effort: Pulmonary effort is normal.     Breath sounds: Normal breath sounds.  Abdominal:     General: There is no distension.     Palpations: Abdomen is soft.     Tenderness: There is no abdominal tenderness.  Musculoskeletal:        General: Swelling (Mild) present.  Skin:    General: Skin is warm and dry.  Neurological:     General: No focal deficit present.     Mental Status: He is alert and oriented to person, place, and time.       Diagnostic Studies & Laboratory data: Cardiac Studies & Procedures   ______________________________________________________________________________________________ CARDIAC CATHETERIZATION  CARDIAC CATHETERIZATION 01/24/2024  Conclusion 1. Elevated right and left  heart filling pressures. 2. Mild pulmonary venous hypertension 3. Low cardiac output by Fick and thermodilution. 4. PAPi low but not markedly low.   CARDIAC CATHETERIZATION  CARDIAC CATHETERIZATION 06/30/2023  Conclusion Findings:  RA = 5 RV = 65/10 PA = 62/21 (35) PCW = 23 Fick cardiac output/index = 4.0/2.1 Thermo CO/CI = 3.9/2.1 PVR = 3.0 WU Ao sat = 99% PA sat = 68% PAPi = 8.2  Assessment: 1. Moderate to severely reduced CO 2. Volume overload 3. 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  Plan/Discussion:  Continue diuresis. Consider w/u for advanced therapies as outpatient  Toribio Fuel, MD 3:37 PM     ECHOCARDIOGRAM  ECHOCARDIOGRAM COMPLETE 01/24/2024  Narrative ECHOCARDIOGRAM REPORT    Patient Name:   RUSHI CHASEN Date of Exam: 01/24/2024 Medical Rec #:  [id number]            Height:       72.0 in Accession #:    [id number]           Weight:       150.0 lb Date of Birth:  08-23-1955  BSA:          1.885 m Patient Age:    68 years             BP:           98/71 mmHg Patient Gender: M                    HR:           76 bpm. Exam Location:  Inpatient  Procedure: 2D Echo (Both Spectral and Color Flow Doppler were utilized during procedure).  Indications:    acute systolic chf  History:        Patient has prior history of Echocardiogram examinations, most recent 07/03/2023. Cardiomyopathy and CHF, CAD, Defibrillator, chronic kidney disease, Arrythmias:Atrial Fibrillation; Risk Factors:Dyslipidemia and Diabetes.  Sonographer:    Tinnie Barefoot RDCS Referring Phys: 10 DALTON S MCLEAN  IMPRESSIONS   1. Left ventricular ejection fraction, by estimation, is 20 to 25%. The left ventricle has severely decreased function. The left ventricle demonstrates global hypokinesis. The left ventricular internal cavity size was mildly dilated. Left ventricular diastolic parameters are consistent with Grade III diastolic dysfunction (restrictive). 2.  Right ventricular systolic function is mildly reduced. The right ventricular size is mildly enlarged. There is mildly elevated pulmonary artery systolic pressure. 3. Left atrial size was moderately dilated. 4. Right atrial size was mildly dilated. 5. The mitral valve is normal in structure. Mild mitral valve regurgitation. No evidence of mitral stenosis. 6. The aortic valve is tricuspid. Aortic valve regurgitation is not visualized. Aortic valve sclerosis is present, with no evidence of aortic valve stenosis. 7. The inferior vena cava is normal in size with greater than 50% respiratory variability, suggesting right atrial pressure of 3 mmHg.  FINDINGS Left Ventricle: Left ventricular ejection fraction, by estimation, is 20 to 25%. The left ventricle has severely decreased function. The left ventricle demonstrates global hypokinesis. The left ventricular internal cavity size was mildly dilated. There is no left ventricular hypertrophy. Left ventricular diastolic parameters are consistent with Grade III diastolic dysfunction (restrictive).  Right Ventricle: The right ventricular size is mildly enlarged. Right ventricular systolic function is mildly reduced. There is mildly elevated pulmonary artery systolic pressure. The tricuspid regurgitant velocity is 3.05 m/s, and with an assumed right atrial pressure of 3 mmHg, the estimated right ventricular systolic pressure is 40.2 mmHg.  Left Atrium: Left atrial size was moderately dilated.  Right Atrium: Right atrial size was mildly dilated.  Pericardium: There is no evidence of pericardial effusion.  Mitral Valve: The mitral valve is normal in structure. Mild mitral valve regurgitation. No evidence of mitral valve stenosis.  Tricuspid Valve: The tricuspid valve is normal in structure. Tricuspid valve regurgitation is mild . No evidence of tricuspid stenosis.  Aortic Valve: The aortic valve is tricuspid. Aortic valve regurgitation is not visualized.  Aortic valve sclerosis is present, with no evidence of aortic valve stenosis.  Pulmonic Valve: The pulmonic valve was normal in structure. Pulmonic valve regurgitation is mild. No evidence of pulmonic stenosis.  Aorta: The aortic root is normal in size and structure.  Venous: The inferior vena cava is normal in size with greater than 50% respiratory variability, suggesting right atrial pressure of 3 mmHg.  IAS/Shunts: No atrial level shunt detected by color flow Doppler.  Additional Comments: A device lead is visualized.   LEFT VENTRICLE PLAX 2D LVIDd:         5.70 cm      Diastology LVIDs:  4.90 cm      LV e' medial:  5.77 cm/s LV PW:         1.00 cm      LV e' lateral: 15.40 cm/s LV IVS:        1.00 cm LVOT diam:     1.90 cm LV SV:         42 LV SV Index:   22 LVOT Area:     2.84 cm LV IVRT:       56 msec  LV Volumes (MOD) LV vol d, MOD A2C: 163.0 ml LV vol d, MOD A4C: 142.0 ml LV vol s, MOD A2C: 139.0 ml LV vol s, MOD A4C: 98.0 ml LV SV MOD A2C:     24.0 ml LV SV MOD A4C:     142.0 ml LV SV MOD BP:      37.5 ml  RIGHT VENTRICLE            IVC RV Basal diam:  3.40 cm    IVC diam: 1.70 cm RV S prime:     9.90 cm/s TAPSE (M-mode): 1.4 cm  LEFT ATRIUM             Index        RIGHT ATRIUM           Index LA diam:        4.40 cm 2.33 cm/m   RA Area:     16.80 cm LA Vol (A2C):   74.7 ml 39.62 ml/m  RA Volume:   46.50 ml  24.66 ml/m LA Vol (A4C):   67.9 ml 36.01 ml/m LA Biplane Vol: 73.9 ml 39.20 ml/m AORTIC VALVE LVOT Vmax:   81.20 cm/s LVOT Vmean:  57.100 cm/s LVOT VTI:    0.148 m  AORTA Ao Root diam: 3.30 cm Ao Asc diam:  3.90 cm  TRICUSPID VALVE TR Peak grad:   37.2 mmHg TR Vmax:        305.00 cm/s  SHUNTS Systemic VTI:  0.15 m Systemic Diam: 1.90 cm  Redell Shallow MD Electronically signed by Redell Shallow MD Signature Date/Time: 01/24/2024/5:38:12 PM    Final    MONITORS  LONG TERM MONITOR (3-14 DAYS) 02/17/2023  Narrative Zio  patch monitor 14 days 01/25/2023 - 02/08/2023: Dominant rhythm: Sinus. HR 59-91 bpm. Avg HR 69 bpm, in sinus rhythm. 2 episodes of SVT/atrial tachycardia, fastest and longest at 118 bpm for 15 beats. <1% isolated SVE, couplet/triplets. 0 episodes of VT. <1% isolated VE, couplets. No atrial fibrillation/atrial flutter/VT/high grade AV block, sinus pause >3sec noted. 3 patient triggered events, correlated with SVE/VE.       ______________________________________________________________________________________________     EKG: NSR I have independently reviewed the above radiologic studies and discussed with the patient   Recent Lab Findings: Lab Results  Component Value Date   WBC 6.8 01/26/2024   HGB 12.9 (L) 01/26/2024   HCT 39.5 01/26/2024   PLT 226 01/26/2024   GLUCOSE 167 (H) 01/26/2024   CHOL 158 11/27/2023   TRIG 60 11/27/2023   HDL 62 11/27/2023   LDLCALC 84 11/27/2023   ALT 15 01/23/2024   AST 22 01/23/2024   NA 137 01/26/2024   K 3.6 01/26/2024   CL 97 (L) 01/26/2024   CREATININE 1.30 (H) 01/26/2024   BUN 22 01/26/2024   CO2 31 01/26/2024   TSH 4.500 01/25/2024   INR 2.1 (H) 07/04/2023   HGBA1C 6.4 (A) 12/12/2023      Assessment / Plan:  Mr. Loveall is a pleasant 69 year old gentleman with a history of end-stage ischemic cardiomyopathy.  He was previously evaluated for LVAD and scheduled but ultimately decided not to proceed with surgery.  He is now readmitted with acute decompensated heart failure and again reassessed for LVAD implantation.    He continues to be very reluctant to proceed with surgery.  I think this is mostly rooted in his fear of losing his independence.  He explains that, to him, his quality of life is much more important than quantity of life, and is concerned about losing his ability to do certain things himself, e.g. shower and drive, following LVAD implant.  His recent ECHO demonstrates an EF 20-25% with global hypokinesis, and mildly  reduced RV function. although PAPi was 1.6 on right heart cath.  He has no significant valvular disease and no ASD.  There is no significant aortic calcium  on CT scan.  My main concern regarding surgery would be his nutritional status.  He and his family admit that he has not been eating well, so he is now quite thin but he does remain relatively strong and mobile.  That being said, I do think he would be an acceptable candidate for VAD.  He is still pretty reluctant to proceed with surgery.  His main argument is that he currently feels well so does not understand why he should undergo a major operation at this point.  I explained that now is the best window for him to proceed with surgery given that he feels well, his renal function is intact, and he is not severely debilitated.  If he were to get to a point where his kidneys started to fail and he could no longer mobilize well, he would no longer be a surgical candidate.  He and his family asked a lot of questions about the operation and what life after LVAD looks like.  I discussed the operation, expected post-operative course as well as the risks and benefits around the operation.  I explained that recovery from LVAD surgery can be challenging and that a good outcome would be predicated on Gamal being fully invested in getting an LVAD.    The AHF team is continuing to manage inotropes and diuresis.  He is weaning down on milrinone  and continues to look well.  I will have Dr. Lucas evaluate the patient on Monday to confirm that he is a surgical candidate.  If so, we do have time later in the week for him to have surgery, however at this point, the patient and his family would like more time to discuss the options.  Con RAMAN Shanaya Schneck 01/26/2024 9:04 PM          [1]  Social History Tobacco Use  Smoking Status Never  Smokeless Tobacco Never  [2]  Allergies Allergen Reactions   Bovine (Beef) Protein-Containing Drug Products    Porcine (Pork)  Protein-Containing Drug Products Other (See Comments)    Religious observance   "

## 2024-01-26 NOTE — TOC Initial Note (Signed)
 Transition of Care [facility]) - Initial/Assessment Note    Patient Details  Name: Garrett Roberts MRN: [id number] Date of Birth: 02-07-1955  Transition of Care [facility]) CM/SW Contact:    Arlana JINNY Nicholaus ISRA[place] Phone Number: [phone] 01/26/2024, 11:31 AM  Clinical Narrative:    11:30 AM-  HF CSW attempted to reach patients daughter, Rana over the phone and left a VM. Will continue to make attempts to make contact with family.            11:31 AM-  HF CSW attempted to reach patients daughter, Reem over the phone and left a VM. Will continue to make attempts to make contact with family.      HF CSW/CM will continue to follow and monitor dc readiness.          Patient Goals and CMS Choice            Expected Discharge Plan and Services                                              Prior Living Arrangements/Services                       Activities of Daily Living   ADL Screening (condition at time of admission) Independently performs ADLs?: No Does the patient have a NEW difficulty with bathing/dressing/toileting/self-feeding that is expected to last >3 days?: No Does the patient have a NEW difficulty with getting in/out of bed, walking, or climbing stairs that is expected to last >3 days?: No Does the patient have a NEW difficulty with communication that is expected to last >3 days?: No Is the patient deaf or have difficulty hearing?: No Does the patient have difficulty seeing, even when wearing glasses/contacts?: No Does the patient have difficulty concentrating, remembering, or making decisions?: No  Permission Sought/Granted                  Emotional Assessment              Admission diagnosis:  CHF (congestive heart failure) (HCC) [I50.9] Patient Active Problem List   Diagnosis Date Noted   CHF (congestive heart failure) (HCC) 01/24/2024   Anemia, unspecified 10/23/2023   Iron deficiency anemia 09/12/2023   Chronic abdominal  pain 09/01/2023   Elevated serum GGT level 09/01/2023   Epigastric pain 07/12/2023   HFrEF (heart failure with reduced ejection fraction) (HCC) 06/30/2023   Recurrent right pleural effusion 06/30/2023   Chronic hypotension 06/30/2023   Renal insufficiency 06/30/2023   Hypothyroidism 06/30/2023   GERD (gastroesophageal reflux disease) 06/30/2023   Presence of heart assist device (HCC) 06/12/2023   Chronic systolic heart failure (HCC) 06/01/2023   Poor appetite 04/19/2023   Hospital discharge follow-up 04/05/2023   Subclinical hypothyroidism 04/05/2023   Loss of weight 03/23/2023   Protein-calorie malnutrition, severe 03/22/2023   Acute on chronic systolic CHF (congestive heart failure) (HCC) 02/17/2023   Ischemic cardiomyopathy 12/19/2022   Chronic HFrEF (heart failure with reduced ejection fraction) (HCC) 12/19/2022   CKD (chronic kidney disease), 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   Type 2 diabetes mellitus with complication, without long-term current use of insulin  (HCC) 12/15/2022   Atrial fibrillation (HCC) 12/14/2022   Constipation 12/12/2022   Left sided abdominal pain 12/11/2022   Abnormal finding on GI tract imaging 12/11/2022   CAD (coronary artery disease) 10/11/2022  Essential hypertension 10/11/2022   Hyperlipidemia 10/11/2022   Vitamin D deficiency 08/03/2015   PCP:  Vincente Shivers, NP Pharmacy:   [facility] [address], [address] [place] [address] Phone: [phone] Fax: [phone]  [facility] - [place], [place] - [address] [place] [address] Phone: [phone] Fax: [phone]  [facility] - [facility] [address] [place] [address] Phone: [phone] Fax: [phone]     Social Drivers of Health (SDOH) Social History: SDOH Screenings   Food Insecurity: Food Insecurity Present (12/08/2023)  Housing: Low Risk (12/08/2023)  Transportation Needs: No  Transportation Needs (12/08/2023)  Utilities: Not At Risk (07/12/2023)  Alcohol Screen: Low Risk (07/12/2023)  Depression (PHQ2-9): Low Risk (09/01/2023)  Recent Concern: Depression (PHQ2-9) - Medium Risk (07/05/2023)  Financial Resource Strain: High Risk (12/08/2023)  Physical Activity: Insufficiently Active (12/08/2023)  Social Connections: Moderately Integrated (12/08/2023)  Stress: No Stress Concern Present (12/08/2023)  Tobacco Use: Low Risk (01/26/2024)  Health Literacy: Adequate Health Literacy (07/12/2023)   SDOH Interventions:     Readmission Risk Interventions     No data to display

## 2024-01-26 NOTE — Progress Notes (Signed)
 PHARMACY - ANTICOAGULATION CONSULT NOTE  Pharmacy Consult for heparin  Indication: atrial fibrillation  Allergies[1]  Patient Measurements: Height: 6' (182.9 cm) Weight: 64.7 kg (142 lb 10.2 oz) IBW/kg (Calculated) : 77.6 HEPARIN  DW (KG): 68  Vital Signs: Temp: 97.3 F (36.3 C) (01/16 0615) BP: 105/73 (01/16 0615) Pulse Rate: 86 (01/16 0615)  Labs: Recent Labs    01/24/24 0916 01/24/24 2205 01/24/24 2205 01/25/24 0454 01/25/24 1011 01/25/24 1455 01/26/24 0512  HGB 12.9*  --   --   --  12.2*  --  12.9*  HCT 38.0*  --   --   --  38.0*  --  39.5  PLT  --   --   --   --  193  --  226  APTT  --  60*   < > 98* 66*  --  111*  HEPARINUNFRC  --  0.83*  --  0.83*  --   --  0.70  CREATININE  --   --   --  1.16 1.20 1.24 1.30*   < > = values in this interval not displayed.    Estimated Creatinine Clearance: 49.8 mL/min (A) (by C-G formula based on SCr of 1.3 mg/dL (H)).  Assessment: 9 yom presenting with low cardiac output and elevated filling pressures on RHC for diuresis and milrinone . On apixaban  PTA for hx Afib (LD 1/13 AM).    Heparin  level correlating with aPTT and on the higher end of therapeutic at 0.70 with heparin  infusion at 1050 units/hr. aPTT slightly supratherapeutic at 111. Per RN, no issues with heparin  infusion running continuously or signs/symptoms of bleeding. CBC stable, PLT 226, Hgb 12.9.   Goal of Therapy:  Heparin  level 0.3-0.7 units/ml aPTT 66-102 seconds Monitor platelets by anticoagulation protocol: Yes   Plan:  Decrease heparin  infusion slightly to 1000 units/hr Monitor daily HL Daily CBC Continue monitoring for s/sx bleeding   Thank you for allowing pharmacy to participate in this patient's care,  Vermell Mccallum, PharmD 01/26/2024 7:13 AM            [1]  Allergies Allergen Reactions   Bovine (Beef) Protein-Containing Drug Products    Porcine (Pork) Protein-Containing Drug Products Other (See Comments)    Religious observance

## 2024-01-26 NOTE — Progress Notes (Signed)
 Physical Therapy Treatment Patient Details Name: Garrett Roberts MRN: [id number] DOB: 12-28-1955 Today's Date: 01/26/2024   History of Present Illness 69 y.o. presented [facility] 01/24/24 for R heart cath. Admitted following procedure for diuresis and milrinone  and discussion about advanced therapies.  PMHx:  Ant wall STEMI, inferior wall STEMI, afib, ischemic cardiomyopathy/chronic systolic CHF, CAD, DM, HTN, HLD, cardioversions, Coronary revascularizations via CATH, ICD    PT Comments  Pt eager to work with PT to get stronger regardless of his decision to get the LVAD or not, reporting I want to be strong either way. Pt able to complete 4 sets of 10 STS. Afterward ambulated 300 feet pushing IV pole and then another 740 feet with 1 lb weights on his ankles. At end of session, pt reports I'm not afraid to die and I don't think I want the surgery.        Can travel by private vehicle      Yes  Equipment Recommendations  None recommended by PT       Precautions / Restrictions Precautions Precautions: Other (comment) Precaution/Restrictions Comments: Norva Restrictions Weight Bearing Restrictions Per Provider Order: No     Mobility  Bed Mobility Overal bed mobility: Modified Independent             General bed mobility comments: for management of lines and leads    Transfers Overall transfer level: Modified independent                 General transfer comment: for management of lines and leads    Ambulation/Gait Ambulation/Gait assistance: Modified independent (Device/Increase time) Gait Distance (Feet): 370 Feet (+740 with 1 lb weights on his ankles) Assistive device: Rolling walker (2 wheels), IV Pole (assist to manage all of his lines and leads) Gait Pattern/deviations: WFL(Within Functional Limits), Step-through pattern Gait velocity: WFL Gait velocity interpretation: >4.37 ft/sec, indicative of normal walking speed   General Gait Details: strong, steady  gait         Balance Overall balance assessment: Independent                                          Communication Communication Communication: No apparent difficulties  Cognition Arousal: Alert Behavior During Therapy: WFL for tasks assessed/performed   PT - Cognitive impairments: No apparent impairments                         Following commands: Intact      Cueing Cueing Techniques: Verbal cues, Visual cues  Exercises Other Exercises Other Exercises: 4 sets of 10 sit to stands    General Comments General comments (skin integrity, edema, etc.): VSS on RA,      Pertinent Vitals/Pain Pain Assessment Pain Assessment: No/denies pain     PT Goals (current goals can now be found in the care plan section) Acute Rehab PT Goals PT Goal Formulation: With patient Time For Goal Achievement: 02/08/24 Potential to Achieve Goals: Good Progress towards PT goals: Progressing toward goals    Frequency    Min 3X/week       AM-PAC PT 6 Clicks Mobility   Outcome Measure  Help needed turning from your back to your side while in a flat bed without using bedrails?: None Help needed moving from lying on your back to sitting on the side of a flat bed without using bedrails?: None  Help needed moving to and from a bed to a chair (including a wheelchair)?: None Help needed standing up from a chair using your arms (e.g., wheelchair or bedside chair)?: None Help needed to walk in hospital room?: None Help needed climbing 3-5 steps with a railing? : None 6 Click Score: 24    End of Session Equipment Utilized During Treatment: Gait belt Activity Tolerance: Patient tolerated treatment well Patient left: in chair;with call bell/phone within reach;with family/visitor present;with chair alarm set Nurse Communication: Mobility status PT Visit Diagnosis: Muscle weakness (generalized) (M62.81)     Time: 8551-8468 PT Time Calculation (min) (ACUTE  ONLY): 43 min  Charges:    $Therapeutic Exercise: 38-52 mins                       Tyeasha Ebbs B. Fleeta Lapidus PT, DPT Acute Rehabilitation Services Please use secure chat or  Call Office [phone]    Almarie KATHEE Fleeta [facility] 01/26/2024, 5:39 PM

## 2024-01-26 NOTE — Progress Notes (Signed)
 "    Advanced Heart Failure Rounding Note  Cardiologist: [name], MD  [facility] Cardiologist: Dr. Cherrie Chief Complaint: End-stage HF Patient Profile   Garrett Roberts is a 69 y.o. male with CAD, DM II, HTN, HLD, ischemic cardiomyopathy/chronic systolic CHF.   Significant events:   1/14: RHC: RA 13, PA 39/18 (26), PCW 20, TD CO/CI 2.81/1.49, PAPi  Subjective:    Co-ox 79%. Soft BP. Co-ox pending. On milrinone  0.375. 5L UOP (net -3.8L).   Swan#s: PAP: (30-70)/(7-44) 55/23 CVP:  [1 mmHg-15 mmHg] <5 mmHg CO:  [5.6 L/min] 5.6 L/min  Feeling good this morning. Has been walking around the unit without any issues. Denies SOB.   Objective:    Weight Range: 64.7 kg Body mass index is 19.35 kg/m.   Vital Signs:   Temp:  [95 F (35 C)-98.2 F (36.8 C)] 97.5 F (36.4 C) (01/16 0800) Pulse Rate:  [69-95] 79 (01/16 0800) Resp:  [0-31] 9 (01/16 0800) BP: (74-122)/(47-94) 99/74 (01/16 0800) SpO2:  [90 %-100 %] 100 % (01/16 0800) Weight:  [64.7 kg] 64.7 kg (01/16 0515) Last BM Date :  (PTA)  Weight change: Filed Weights   01/24/24 0653 01/25/24 0500 01/26/24 0515  Weight: 68 kg 64.6 kg 64.7 kg   Intake/Output:  Intake/Output Summary (Last 24 hours) at 01/26/2024 0840 Last data filed at 01/26/2024 0800 Gross per 24 hour  Intake 1587.42 ml  Output 4900 ml  Net -3312.58 ml    Physical Exam   General:  elderly/frail appearing.  No respiratory difficulty Neck: JVD ~5 cm.  Cor: Regular rate & rhythm. No murmurs. Lungs: clear Extremities: no edema. +UNNA boots  Neuro: alert & oriented x 3. Affect pleasant.   Telemetry   SR 80s w frequent PVCs (personally reviewed)  Labs   CBC Recent Labs    01/25/24 1011 01/26/24 0512  WBC 5.7 6.8  HGB 12.2* 12.9*  HCT 38.0* 39.5  MCV 86.8 85.1  PLT 193 226   Basic Metabolic Panel Recent Labs    98/84/73 1011 01/25/24 1455 01/25/24 1456 01/26/24 0512  NA 134* 134*  --  137  K 3.3* 4.6  --  3.6  CL 96*  97*  --  97*  CO2 30 28  --  31  GLUCOSE 220* 219*  --  167*  BUN 22 24*  --  22  CREATININE 1.20 1.24  --  1.30*  CALCIUM  8.3* 8.5*  --  8.9  MG 2.0  --  2.0  --   PHOS 2.8 2.9  --   --    Liver Function Tests Recent Labs    01/23/24 1118 01/25/24 1011 01/25/24 1455  AST 22  --   --   ALT 15  --   --   ALKPHOS 167*  --   --   BILITOT 0.7  --   --   PROT 7.5  --   --   ALBUMIN  3.9 3.3* 3.5   BNP (last 3 results) Recent Labs    07/19/23 1333 08/25/23 1125 10/20/23 1325  BNP 1,374.0* 1,477.8* 1,886.7*   ProBNP (last 3 results) Recent Labs    01/17/24 1236 01/23/24 1118 01/25/24 1456  PROBNP 1,945.0* 3,357.0* 2,392.0*   Medications:    Scheduled Medications:  amiodarone   100 mg Oral Daily   Chlorhexidine  Gluconate Cloth  6 each Topical Daily   digoxin   0.125 mg Oral Daily   empagliflozin   25 mg Oral Daily   ezetimibe   10 mg Oral  Daily   feeding supplement  237 mL Oral TID BM   levothyroxine   112 mcg Oral Daily   pantoprazole   40 mg Oral BID AC   rosuvastatin   40 mg Oral Daily   sodium chloride  flush  3 mL Intravenous Q12H   tadalafil   20 mg Oral Daily    Infusions:  sodium chloride  10 mL/hr at 01/26/24 0800   sodium chloride  10 mL/hr at 01/26/24 0800   sodium chloride  10 mL/hr at 01/26/24 0800   sodium chloride      heparin  1,000 Units/hr (01/26/24 0800)   milrinone  0.375 mcg/kg/min (01/26/24 0800)    PRN Medications: sodium chloride , sodium chloride , acetaminophen , ondansetron  (ZOFRAN ) IV, mouth rinse, sodium chloride  flush  Assessment/Plan   1. Acute on chronic systolic CHF/ischemic cardiomyopathy/low output HF:  Biotronik ICD.  Echo 12/24 with EF 20-25%, no LV thrombus, AK mid septum to apex, moderately reduced RV systolic function. Echo 2/25 with EF 25%, mildly reduced RV function.  Low BP has limited GDMT titration.  His profound fatigue has been concerning for low output HF. RHC 3/25 showed low CI by thermo (2.05) but preserved by Fick.   Borderline elevated PCWP, normal RA pressure.  There was also moderate mixed pulmonary arterial/pulmonary venous hypertension.  With his significant symptoms, he was started on milrinone  0.25 and added sildenafil  to try to lower PA pressure.  Milrinone  was later weaned off with medication adjustment, and co-ox remained excellent.  He was readmitted in 6/25 with CHF; RHC showed moderate mixed pulmonary arterial/pulmonary venous hypertension with CI low at 2.1 by Fick and thermo.  Echo in 6/25 showed EF 20-25%, mild LV dilation, moderate RV dysfunction, normal RV size, PASP 44 mmHg, mild-moderate MR.  We have had to peel back on GDMT due to low BP and he is now taking midodrine .  With low output heart failure (CI 2.1 on 6/25 RHC and inability to tolerate GDMT - now on midodrine ), we have been discussing advanced therapies.  He is not interested in transplant and at his age, it would be more difficult to obtain. I think he would be a reasonable LVAD candidate. We have a window at this time while his RV function is not severely depressed and his renal function is relatively preserved.  He has completed workup.  We had him set up for LVAD surgery, but he decided that he wanted to wait longer (daughter getting married).  At appointment last week, he had gained 17 lbs and was significantly volume overloaded with NYHA class III symptoms.  Got Furoscix  over the weekend and increased his torsemide  after that, but at appointment, he was up another 4 lbs. Unfortunately, he appears to have failed outpatient diuresis.  RHC 1/14, showing low cardiac output by Fick and thermodilution and elevated filling pressures.  PAPi low at 1.6 but not markedly low.  Remain concerned that we missed his window for LVAD implantation.  - Echo 1/14 showed EF 20-25%, G3DD, mildly reduced RV, mild MR - PAC in place; hemodynamics as above. PAPi improved with diuresis, PCW on PAC 26. - Co-ox 79%. On Milrinone  0.375 mcg/kg/min. - CVP <5 and small  bump in SCr. Stop lasix  gtt. Will reevaluate this afternoon, will probably start Torsemide  60 mg BID.  - Continue Jardiance  25 mg daily (hold if he decides on LVAD). - Continue digoxin  0.125 daily. - Off midodrine  and Veroquvo, restart at discharge if needed.  - off Toprol  XL with low output.  - Off spironolactone  with low BP.  -  We have discussed LVAD extensively, daughter present. Optimize his volume status and then get LVAD surgery arranged, ideally this admission.  He still seems on the fence about the device.  Continue discussion and will have surgery come by to see him again. Family asked TCTS to come back today as they were too overwhelmed yesterday with information.    2. CAD:  Hx inferior STEMI 10/23 s/p PCI/DES to RCA. Anterior STEMI 12/10/22, cath showed occluded pLAD treated with PCI/DES, prior RCA stent patent, severe disease OM1 and distal LCx treated medically. No chest pain.  - > 1 year post-PCI, will hold Plavix  pre-potential LVAD.   - No ASA given Eliquis  use.  - Continue rosuvastatin  40 mg + Zetia  10 mg daily.  LDL above goal < 55 in 11/25, would ideally get on Repatha but for now will focus on preparation for LVAD.    3. Atrial fibrillation: Paroxysmal. Has been in NSR.  - Heparin  gtt for now, has been on apixaban  at home.  - Continue amiodarone  100 mg daily, recent LFTs and TSH normal.   4. DM II: Continue Jardiance  (hold if LVAD surgery set up).   5. 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 Not a smoker, no known lung disease. CXR has shown some chronic interstitial changes. HRCT chest with bibasilar bronchiectasis that may be due to aspiration (though swallow study negative).  V/Q scan showed no chronic PE. Rheumatologic serologies negative. RHC 1/14 with improved PA pressure, only mild pulmonary venous hypertension.  - Continue tadalafil  20 mg daily.    6. Pleural effusion: S/p right thoracentesis 3/25 and  again in 11/25. Pleural fluid was transudative by Light's criteria, no malignant cells. Suspect he has reaccumulated pleural fluid.   - CXR with lrg R plerual effusion - CXR today with stable small pleural effusion  7. GI: History of early satiety, GI discomfort.  CT abdomen/pelvis with gastric wall thickening. GI was consulted and EGD was performed, no definite evidence for malignancy, biopsies taken for H pylori testing and pathology => negative for H pylori and no gastric cancer.  Gastric emptying study normal in 9/25.  Abdominal US  showed normal appearing liver in 9/25.  Virtual colonoscopy unremarkable.  He actually is feeling better with improved appetite.   8. 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  - Continue Levoxyl , recent TSH ok.   9. Fe deficiency anemia: He has had IV FE.   CRITICAL CARE Performed by: Toribio Fuel  Total critical care time: 9 minutes  -Critical care time was exclusive of separately billable procedures and treating other patients. -Critical care was necessary to treat or prevent imminent or life-threatening deterioration. -Critical care was time spent personally by me on the following activities: development of treatment plan with patient and/or surrogate as well as nursing, discussions with consultants, evaluation of patient's response to treatment, examination of patient, obtaining history from patient or surrogate, ordering and performing treatments and interventions, ordering and review of laboratory studies, ordering and review of radiographic studies, pulse oximetry and re-evaluation of patient's condition.  Length of Stay: 2  Toribio Fuel, MD  01/26/2024, 8:40 AM  Advanced Heart Failure Team Pager [phone] (M-F; 7a - 5p)   Please visit [email or web address]: For overnight coverage please call cardiology fellow first. If fellow not available call Shock/ECMO MD on call.  For ECMO / Mechanical Support (Impella, IABP, LVAD) issues call Shock / ECMO  MD on call.   Agree with above.   Remains on milrinone  0.375.  Co-ox 79%, Has diuresed well. CVP < 5  Weight down 8 pounds.   PAP: (30-70)/(7-44) 42/12 CVP:  [1 mmHg-15 mmHg] 2 mmHg CO:  [5.6 L/min] 5.6 L/min   General:  Sitting up in bed. Frail No resp difficulty HEENT: normal Neck: supple. no JVD.  RIJ swan Cor: Regular rate & rhythm. No rubs, gallops or murmurs. Lungs: clear dull right base  Abdomen: soft, nontender, nondistended.Good bowel sounds. Extremities: no cyanosis, clubbing, rash, tr edema + UNNA Neuro: alert & orientedx3, cranial nerves grossly intact. moves all 4 extremities w/o difficulty. Affect pleasant  Volume status and hemodynamics much improved. Can cut milrinone  back to 0.25 today.   Await input from TCTS later today on VAD candidacy. He certainly needs advanced therapies if his size and fraility are not prohibitive. (I think his RV will tolerate)   Will have CCM look at R effusion.   Switch diuretics to po.  CRITICAL CARE Performed by: Cherrie Sieving  Total critical care time: 42 minutes  Critical care time was exclusive of separately billable procedures and treating other patients.  Critical care was necessary to treat or prevent imminent or life-threatening deterioration.  Critical care was time spent personally by me (independent of midlevel providers or residents) on the following activities: development of treatment plan with patient and/or surrogate as well as nursing, discussions with consultants, evaluation of patient's response to treatment, examination of patient, obtaining history from patient or surrogate, ordering and performing treatments and interventions, ordering and review of laboratory studies, ordering and review of radiographic studies, pulse oximetry and re-evaluation of patient's condition.  Sieving Cherrie, MD  8:45 AM      "

## 2024-01-27 ENCOUNTER — Other Ambulatory Visit: Payer: Self-pay

## 2024-01-27 LAB — BASIC METABOLIC PANEL WITH GFR
Anion gap: 8 (ref 5–15)
BUN: 27 mg/dL — ABNORMAL HIGH (ref 8–23)
CO2: 29 mmol/L (ref 22–32)
Calcium: 8.8 mg/dL — ABNORMAL LOW (ref 8.9–10.3)
Chloride: 99 mmol/L (ref 98–111)
Creatinine, Ser: 1.27 mg/dL — ABNORMAL HIGH (ref 0.61–1.24)
GFR, Estimated: >60 mL/min
Glucose, Bld: 158 mg/dL — ABNORMAL HIGH (ref 70–99)
Potassium: 4 mmol/L (ref 3.5–5.1)
Sodium: 136 mmol/L (ref 135–145)

## 2024-01-27 LAB — COOXEMETRY PANEL
Carboxyhemoglobin: 1.2 % (ref 0.5–1.5)
Methemoglobin: <0.7 % (ref 0.0–1.5)
O2 Saturation: 68.4 %
Total hemoglobin: 11.3 g/dL — ABNORMAL LOW (ref 12.0–16.0)

## 2024-01-27 LAB — CBC
HCT: 37.8 % — ABNORMAL LOW (ref 39.0–52.0)
Hemoglobin: 12.2 g/dL — ABNORMAL LOW (ref 13.0–17.0)
MCH: 27.7 pg (ref 26.0–34.0)
MCHC: 32.3 g/dL (ref 30.0–36.0)
MCV: 85.7 fL (ref 80.0–100.0)
Platelets: 216 K/uL (ref 150–400)
RBC: 4.41 MIL/uL (ref 4.22–5.81)
RDW: 17.6 % — ABNORMAL HIGH (ref 11.5–15.5)
WBC: 7.1 K/uL (ref 4.0–10.5)
nRBC: 0 % (ref 0.0–0.2)

## 2024-01-27 LAB — HEPARIN LEVEL (UNFRACTIONATED): Heparin Unfractionated: 0.58 [IU]/mL (ref 0.30–0.70)

## 2024-01-27 MED ORDER — TORSEMIDE 20 MG PO TABS
40.0000 mg | ORAL_TABLET | Freq: Every day | ORAL | Status: DC
  Administered 2024-01-27 – 2024-01-31 (×5): 40 mg via ORAL
  Filled 2024-01-27 (×5): qty 2

## 2024-01-27 NOTE — Progress Notes (Addendum)
 PHARMACY - ANTICOAGULATION CONSULT NOTE  Pharmacy Consult for heparin  Indication: atrial fibrillation  Allergies[1]  Patient Measurements: Height: 6' (182.9 cm) Weight: 64.7 kg (142 lb 10.2 oz) IBW/kg (Calculated) : 77.6 HEPARIN  DW (KG): 68  Vital Signs: Temp: 98.2 F (36.8 C) (01/17 0000) BP: 92/65 (01/17 0000) Pulse Rate: 88 (01/17 0000)  Labs: Recent Labs    01/25/24 0454 01/25/24 1011 01/25/24 1455 01/26/24 0512 01/27/24 0529  HGB  --  12.2*  --  12.9* 12.2*  HCT  --  38.0*  --  39.5 37.8*  PLT  --  193  --  226 216  APTT 98* 66*  --  111*  --   HEPARINUNFRC 0.83*  --   --  0.70 0.58  CREATININE 1.16 1.20 1.24 1.30* 1.27*    Estimated Creatinine Clearance: 50.9 mL/min (A) (by C-G formula based on SCr of 1.27 mg/dL (H)).  Assessment: 11 yom presenting with low cardiac output and elevated filling pressures on RHC for diuresis and milrinone . On apixaban  PTA for hx Afib (LD 1/13 AM).    01/27/24: Heparin  level 0.58, therapeutic at heparin  1000 units/hr. No issues with infusion running or signs of bleeding per RN. CBC stable (Hgb 12.2, PLT 216).   Goal of Therapy:  Heparin  level 0.3-0.7 units/ml aPTT 66-102 seconds Monitor platelets by anticoagulation protocol: Yes   Plan:  Continue heparin  infusion at 1000 units/hr Monitor heparin  level, CBC, and s/sx of bleeding daily F/u plans to resume for surgical intervention  Thank you for allowing pharmacy to participate in this patient's care,  Morna Breach, PharmD, BCPS PGY2 Cardiology Pharmacy Resident 01/27/2024 6:44 AM     [1]  Allergies Allergen Reactions   Bovine (Beef) Protein-Containing Drug Products    Porcine (Pork) Protein-Containing Drug Products Other (See Comments)    Religious observance

## 2024-01-27 NOTE — Progress Notes (Signed)
 Very pleasant lengthy conversation with pt, son and dtr along with other family members re PICC placement. Refusal of placement this pm and wants to further discuss options with Dr Bensimhon and Dr Daniel.  Verbalize no desire to have LVAD surgery but want medication options for po treatment vs IV options.  Concerned about his independence and intrusive of his daily life. Secure chat sent to both physicians and RN.

## 2024-01-27 NOTE — Progress Notes (Signed)
 "    Advanced Heart Failure Rounding Note  Cardiologist: [name], MD  [facility] Cardiologist: Dr. Cherrie Chief Complaint: End-stage HF Patient Profile   Garrett Roberts is a 69 y.o. male with CAD, DM II, HTN, HLD, ischemic cardiomyopathy/chronic systolic CHF.   Significant events:   1/14: RHC: RA 13, PA 39/18 (26), PCW 20, TD CO/CI 2.81/1.49, PAPi  Subjective:    Remains on milrinone  0.375. Feels good.   PAP: (34-63)/(12-23) 48/15 CVP:  [0 mmHg-7 mmHg] 2 mmHg CO:  [6 L/min-7.2 L/min] 6 L/min CI:  [3.8 L/min/m2] 3.8 L/min/m2  Spoke with Dr. Haneef Hallquist last night unsure if wants to proceed with VAD   Objective:    Weight Range: 64.7 kg Body mass index is 19.35 kg/m.   Vital Signs:   Temp:  [96.8 F (36 C)-98.8 F (37.1 C)] 98.1 F (36.7 C) (01/17 0800) Pulse Rate:  [75-89] 89 (01/17 0800) Resp:  [10-24] 15 (01/17 0800) BP: (74-120)/(46-92) 74/51 (01/17 0800) SpO2:  [95 %-100 %] 99 % (01/17 0800) Last BM Date :  (PTA)  Weight change: Filed Weights   01/24/24 0653 01/25/24 0500 01/26/24 0515  Weight: 68 kg 64.6 kg 64.7 kg   Intake/Output:  Intake/Output Summary (Last 24 hours) at 01/27/2024 1057 Last data filed at 01/27/2024 0800 Gross per 24 hour  Intake 1210.49 ml  Output 400 ml  Net 810.49 ml    Physical Exam   General:  Sitting up in bed. No resp difficulty HEENT: normal Neck: supple. RIJ swan  Cor: Regular rate & rhythm. No rubs, gallops or murmurs. Lungs: clear Abdomen: soft, nontender, nondistended.Good bowel sounds. Extremities: no cyanosis, clubbing, rash, edema Neuro: alert & orientedx3, cranial nerves grossly intact. moves all 4 extremities w/o difficulty. Affect pleasant   Telemetry   SR 80s + PVCs Personally reviewed  Labs   CBC Recent Labs    01/26/24 0512 01/27/24 0529  WBC 6.8 7.1  HGB 12.9* 12.2*  HCT 39.5 37.8*  MCV 85.1 85.7  PLT 226 216   Basic Metabolic Panel Recent Labs    98/84/73 1011 01/25/24 1455  01/25/24 1456 01/26/24 0512 01/27/24 0529  NA 134* 134*  --  137 136  K 3.3* 4.6  --  3.6 4.0  CL 96* 97*  --  97* 99  CO2 30 28  --  31 29  GLUCOSE 220* 219*  --  167* 158*  BUN 22 24*  --  22 27*  CREATININE 1.20 1.24  --  1.30* 1.27*  CALCIUM  8.3* 8.5*  --  8.9 8.8*  MG 2.0  --  2.0  --   --   PHOS 2.8 2.9  --   --   --    Liver Function Tests Recent Labs    01/25/24 1011 01/25/24 1455  ALBUMIN  3.3* 3.5   BNP (last 3 results) Recent Labs    07/19/23 1333 08/25/23 1125 10/20/23 1325  BNP 1,374.0* 1,477.8* 1,886.7*   ProBNP (last 3 results) Recent Labs    01/17/24 1236 01/23/24 1118 01/25/24 1456  PROBNP 1,945.0* 3,357.0* 2,392.0*   Medications:    Scheduled Medications:  amiodarone   100 mg Oral Daily   Chlorhexidine  Gluconate Cloth  6 each Topical Daily   digoxin   0.125 mg Oral Daily   docusate sodium   100 mg Oral Daily   empagliflozin   25 mg Oral Daily   ezetimibe   10 mg Oral Daily   feeding supplement  237 mL Oral TID BM   levothyroxine   112 mcg Oral Daily   pantoprazole   40 mg Oral BID AC   rosuvastatin   40 mg Oral Daily   sodium chloride  flush  3 mL Intravenous Q12H   tadalafil   20 mg Oral Daily    Infusions:  sodium chloride  10 mL/hr at 01/27/24 0800   sodium chloride  10 mL/hr at 01/27/24 0800   sodium chloride  10 mL/hr at 01/27/24 0800   heparin  1,000 Units/hr (01/27/24 0800)   milrinone  0.25 mcg/kg/min (01/27/24 0800)    PRN Medications: sodium chloride , acetaminophen , ondansetron  (ZOFRAN ) IV, mouth rinse, sodium chloride  flush  Assessment/Plan   1. 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  - Echo 01/11/24 EF 20-25%, G3DD, mildly reduced RV, mild MR - 1/14: RHC: RA 13, PA 39/18 (26), PCW 20, TD CO/CI 2.81/1.49, PAPi 1.7 - PAC in place; hemodynamics as above. PAPi improved with diuresis - Co-ox 68%. On Milrinone  0.25 mcg/kg/min. - Volume optimized. Continue to hold diuretics  for now - Continue Jardiance  25 mg daily (hold if he decides on LVAD). - Continue digoxin  0.125 daily. - Off midodrine , Veroquvo, Toprol  XL and spironolactone  - He has been reluctant about LVAD for quite some time but I think his window is now closing.quickly. He has met with Dr. Aziah Brostrom from [facility] and felt to be a sufficient operative candidate and was offered a surgical spot this Thurs/Friday. He remains reluctant due to fear of losing his independence. I told him tat his window to proceed with LVAD is closing and that only other option would be palliative milrinone  which would likely only have short-term benefit - He will d/w family today. I told him we would reserve a spot fro him in OR on Thursday - Pull swan. Place PICC   2. CAD:  Hx inferior STEMI 10/23 s/p PCI/DES to RCA. Anterior STEMI 12/10/22, cath showed occluded pLAD treated with PCI/DES, prior RCA stent patent, severe disease OM1 and distal LCx treated medically. No chest pain.  - > 1 year post-PCI, will hold Plavix  pre-potential LVAD.   - No ASA given Eliquis  use.  - Continue rosuvastatin  40 mg + Zetia  10 mg daily.  LDL above goal < 55 in 11/25, would ideally get on Repatha but for now will focus on preparation for LVAD.   - No s/s angina  3. Atrial fibrillation: Paroxysmal. Has been in NSR.  - Heparin  gtt for now, has been on apixaban  at home.  - Continue amiodarone  100 mg daily, recent LFTs and TSH normal.  - no change  4. DM II: Continue Jardiance  (hold if LVAD surgery set up).   5. 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 Not a smoker, no known lung disease. CXR has shown some chronic interstitial changes. HRCT chest with bibasilar bronchiectasis that may be due to aspiration (though swallow study negative).  V/Q scan showed no chronic PE. Rheumatologic serologies negative. RHC 1/14 with improved PA pressure, only mild pulmonary venous hypertension.  - Continue tadalafil   20 mg daily.    6. Pleural effusion: S/p right thoracentesis 3/25 and again in 11/25. Pleural fluid was transudative by Light's criteria, no malignant cells. Suspect he has reaccumulated pleural fluid.   - CXR/CT with moderate R pleural effusion  -> d/w CCM. Continue to follow. Can tap as needed (if not getting VAD would drain prior to d/c)  7. GI: History of early satiety, GI discomfort.  CT abdomen/pelvis with gastric wall thickening. GI was consulted  and EGD was performed, no definite evidence for malignancy, biopsies taken for H pylori testing and pathology => negative for H pylori and no gastric cancer.  Gastric emptying study normal in 9/25.  Abdominal US  showed normal appearing liver in 9/25.  Virtual colonoscopy unremarkable.  He actually is feeling better with improved appetite.   8. 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  - Continue Levoxyl , recent TSH ok.   9. Fe deficiency anemia: He has had IV FE.   CRITICAL CARE Performed by: Toribio Fuel  Total critical care time:45 minutes  -Critical care time was exclusive of separately billable procedures and treating other patients. -Critical care was necessary to treat or prevent imminent or life-threatening deterioration. -Critical care was time spent personally by me on the following activities: development of treatment plan with patient and/or surrogate as well as nursing, discussions with consultants, evaluation of patient's response to treatment, examination of patient, obtaining history from patient or surrogate, ordering and performing treatments and interventions, ordering and review of laboratory studies, ordering and review of radiographic studies, pulse oximetry and re-evaluation of patient's condition.  Length of Stay: 3  Toribio Fuel, MD  01/27/2024, 10:57 AM  Advanced Heart Failure Team Pager [phone] (M-F; 7a - 5p)   Please visit [email or web address]: For overnight coverage please call cardiology fellow first.  If fellow not available call Shock/ECMO MD on call.  For ECMO / Mechanical Support (Impella, IABP, LVAD) issues call Shock / ECMO MD on call.      "

## 2024-01-28 LAB — CBC
HCT: 36.6 % — ABNORMAL LOW (ref 39.0–52.0)
Hemoglobin: 11.8 g/dL — ABNORMAL LOW (ref 13.0–17.0)
MCH: 27.7 pg (ref 26.0–34.0)
MCHC: 32.2 g/dL (ref 30.0–36.0)
MCV: 85.9 fL (ref 80.0–100.0)
Platelets: 201 K/uL (ref 150–400)
RBC: 4.26 MIL/uL (ref 4.22–5.81)
RDW: 17.3 % — ABNORMAL HIGH (ref 11.5–15.5)
WBC: 6.2 K/uL (ref 4.0–10.5)
nRBC: 0 % (ref 0.0–0.2)

## 2024-01-28 LAB — COOXEMETRY PANEL
Carboxyhemoglobin: 1.7 % — ABNORMAL HIGH (ref 0.5–1.5)
Methemoglobin: <0.7 % (ref 0.0–1.5)
O2 Saturation: 73.4 %
Total hemoglobin: 11.9 g/dL — ABNORMAL LOW (ref 12.0–16.0)

## 2024-01-28 LAB — BASIC METABOLIC PANEL WITH GFR
Anion gap: 7 (ref 5–15)
BUN: 26 mg/dL — ABNORMAL HIGH (ref 8–23)
CO2: 30 mmol/L (ref 22–32)
Calcium: 8.8 mg/dL — ABNORMAL LOW (ref 8.9–10.3)
Chloride: 99 mmol/L (ref 98–111)
Creatinine, Ser: 1.22 mg/dL (ref 0.61–1.24)
GFR, Estimated: >60 mL/min
Glucose, Bld: 135 mg/dL — ABNORMAL HIGH (ref 70–99)
Potassium: 4.1 mmol/L (ref 3.5–5.1)
Sodium: 136 mmol/L (ref 135–145)

## 2024-01-28 LAB — HEPARIN LEVEL (UNFRACTIONATED): Heparin Unfractionated: 0.58 [IU]/mL (ref 0.30–0.70)

## 2024-01-28 LAB — MAGNESIUM: Magnesium: 2.1 mg/dL (ref 1.7–2.4)

## 2024-01-28 MED ORDER — PHENOL 1.4 % MT LIQD
1.0000 | OROMUCOSAL | Status: DC | PRN
  Filled 2024-01-28: qty 177

## 2024-01-28 MED ORDER — SODIUM CHLORIDE 0.9% FLUSH
10.0000 mL | Freq: Two times a day (BID) | INTRAVENOUS | Status: DC
  Administered 2024-01-28: 20 mL
  Administered 2024-01-29 – 2024-01-31 (×4): 10 mL

## 2024-01-28 MED ORDER — SODIUM CHLORIDE 0.9% FLUSH: 10.0000 mL | INTRAVENOUS | Status: DC | PRN

## 2024-01-28 NOTE — Progress Notes (Signed)
 "    Advanced Heart Failure Rounding Note  Cardiologist: [name], MD  [facility] Cardiologist: Dr. Cherrie Chief Complaint: End-stage HF Patient Profile   Garrett Roberts is a 69 y.o. male with CAD, DM II, HTN, HLD, ischemic cardiomyopathy/chronic systolic CHF.   Significant events:   1/14: RHC: RA 13, PA 39/18 (26), PCW 20, TD CO/CI 2.81/1.49, PAPi  Subjective:    Remains on milrinone  0.25. Feels good Co-ox 73%   Swan #s PAP: (47-63)/(13-27) 57/18 CVP:  [2 mmHg-11 mmHg] 11 mmHg CO:  [4 L/min-4.5 L/min] 4 L/min CI:  [2.2 L/min/m2] 2.2 L/min/m2  I spoke with him yesterday and was contemplating VAD. However he told PICC RN last night that he had no interest in VAD.   Remains resistant to idea of VAD. Says he will come back later when he feels worse.    Objective:    Weight Range: 64.7 kg Body mass index is 19.35 kg/m.   Vital Signs:   Temp:  [97.7 F (36.5 C)-98.6 F (37 C)] 98.1 F (36.7 C) (01/18 0800) Pulse Rate:  [75-89] 81 (01/18 0800) Resp:  [9-25] 20 (01/18 0800) BP: (89-105)/(58-74) 102/67 (01/18 0800) SpO2:  [96 %-100 %] 99 % (01/18 0800) Last BM Date :  (PTA)  Weight change: Filed Weights   01/24/24 0653 01/25/24 0500 01/26/24 0515  Weight: 68 kg 64.6 kg 64.7 kg   Intake/Output:  Intake/Output Summary (Last 24 hours) at 01/28/2024 1025 Last data filed at 01/28/2024 0800 Gross per 24 hour  Intake 567.81 ml  Output 1550 ml  Net -982.19 ml    Physical Exam   General:  Sitting up in bed. No resp difficulty HEENT: normal Neck: supple. RIJ swan Cor: Regular rate & rhythm. No rubs, gallops or murmurs. Lungs: clear Abdomen: soft, nontender, nondistended.Good bowel sounds. Extremities: no cyanosis, clubbing, rash, edema Neuro: alert & orientedx3, cranial nerves grossly intact. moves all 4 extremities w/o difficulty. Affect pleasant   Telemetry   SR 80s + PVCs Personally reviewed  Labs   CBC Recent Labs    01/27/24 0529  01/28/24 0535  WBC 7.1 6.2  HGB 12.2* 11.8*  HCT 37.8* 36.6*  MCV 85.7 85.9  PLT 216 201   Basic Metabolic Panel Recent Labs    98/84/73 1455 01/25/24 1456 01/26/24 0512 01/27/24 0529 01/28/24 0535  NA 134*  --    < > 136 136  K 4.6  --    < > 4.0 4.1  CL 97*  --    < > 99 99  CO2 28  --    < > 29 30  GLUCOSE 219*  --    < > 158* 135*  BUN 24*  --    < > 27* 26*  CREATININE 1.24  --    < > 1.27* 1.22  CALCIUM  8.5*  --    < > 8.8* 8.8*  MG  --  2.0  --   --  2.1  PHOS 2.9  --   --   --   --    < > = values in this interval not displayed.   Liver Function Tests Recent Labs    01/25/24 1455  ALBUMIN  3.5   BNP (last 3 results) Recent Labs    07/19/23 1333 08/25/23 1125 10/20/23 1325  BNP 1,374.0* 1,477.8* 1,886.7*   ProBNP (last 3 results) Recent Labs    01/17/24 1236 01/23/24 1118 01/25/24 1456  PROBNP 1,945.0* 3,357.0* 2,392.0*   Medications:  Scheduled Medications:  amiodarone   100 mg Oral Daily   Chlorhexidine  Gluconate Cloth  6 each Topical Daily   digoxin   0.125 mg Oral Daily   docusate sodium   100 mg Oral Daily   empagliflozin   25 mg Oral Daily   ezetimibe   10 mg Oral Daily   feeding supplement  237 mL Oral TID BM   levothyroxine   112 mcg Oral Daily   pantoprazole   40 mg Oral BID AC   rosuvastatin   40 mg Oral Daily   sodium chloride  flush  3 mL Intravenous Q12H   tadalafil   20 mg Oral Daily   torsemide   40 mg Oral Daily    Infusions:  sodium chloride  10 mL/hr at 01/28/24 0800   heparin  1,000 Units/hr (01/28/24 0800)   milrinone  0.25 mcg/kg/min (01/28/24 0800)    PRN Medications: sodium chloride , acetaminophen , ondansetron  (ZOFRAN ) IV, mouth rinse, phenol, sodium chloride  flush  Assessment/Plan   1. 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  - Echo 01/11/24 EF 20-25%, G3DD, mildly reduced RV, mild MR - 1/14: RHC: RA 13, PA 39/18 (26), PCW 20, TD CO/CI 2.81/1.49, PAPi 1.7 -  PAC in place; hemodynamics as above. PAPi improved with diuresis - Co-ox 73%. On Milrinone  0.25 mcg/kg/min. - CVP 10.Now on por torsemide  - Continue Jardiance  25 mg daily (hold if he decides on LVAD). - Continue digoxin  0.125 daily. - Off midodrine , Veroquvo, Toprol  XL and spironolactone  - He has been reluctant about LVAD for quite some time but I think his window is now closing.quickly. He has met with Dr. Wilhelmenia Addis from [facility] and felt to be a sufficient operative candidate and was offered a surgical spot this Thurs/Friday. He remains reluctant due to fear of losing his independence. I told him tat his window to proceed with LVAD is closing and that only other option would be palliative milrinone  which would likely only have short-term benefit - I spoke with him yesterday and was contemplating VAD. However he told PICC RN last night that he had no interest in VAD.  - Dr. Talah Cookston and I spoke with him again today. Remains resistant to idea of VAD. Says he will come back later when he feels worse. Also not sure if he wants home milrinone  - Will pull swan and place PICC for inpatient monitoring     2. CAD:  Hx inferior STEMI 10/23 s/p PCI/DES to RCA. Anterior STEMI 12/10/22, cath showed occluded pLAD treated with PCI/DES, prior RCA stent patent, severe disease OM1 and distal LCx treated medically. No chest pain.  - > 1 year post-PCI, will hold Plavix  pre-potential LVAD.   - No ASA given Eliquis  use.  - Continue rosuvastatin  40 mg + Zetia  10 mg daily.  LDL above goal < 55 in 11/25, would ideally get on Repatha but for now will focus on preparation for LVAD.   - No s/s angina  3. Atrial fibrillation: Paroxysmal.  - In NSR today. On heparin  here, has been on apixaban  at home.  - Continue amiodarone  100 mg daily, recent LFTs and TSH normal.  - no change  4. DM II: Continue Jardiance  (hold if LVAD surgery set up).   5. 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 Not a smoker, no known lung disease. CXR has shown some chronic interstitial changes. HRCT chest with bibasilar bronchiectasis that may be due to aspiration (though swallow study negative).  V/Q scan showed no chronic PE. Rheumatologic  serologies negative. RHC 1/14 with improved PA pressure, only mild pulmonary venous hypertension.  - Continue tadalafil  20 mg daily.    6. Pleural effusion: S/p right thoracentesis 3/25 and again in 11/25. Pleural fluid was transudative by Light's criteria, no malignant cells. Suspect he has reaccumulated pleural fluid.   - CXR/CT with moderate R pleural effusion  -> d/w CCM. Continue to follow. Can tap as needed (if not getting VAD would drain prior to d/c)  7. GI: History of early satiety, GI discomfort.  CT abdomen/pelvis with gastric wall thickening. GI was consulted and EGD was performed, no definite evidence for malignancy, biopsies taken for H pylori testing and pathology => negative for H pylori and no gastric cancer.  Gastric emptying study normal in 9/25.  Abdominal US  showed normal appearing liver in 9/25.  Virtual colonoscopy unremarkable.  He actually is feeling better with improved appetite.   8. 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  - Continue Levoxyl , recent TSH ok.   9. Fe deficiency anemia: He has had IV FE.   CRITICAL CARE Performed by: Toribio Fuel  Total critical care time:50 minutes  -Critical care time was exclusive of separately billable procedures and treating other patients. -Critical care was necessary to treat or prevent imminent or life-threatening deterioration. -Critical care was time spent personally by me on the following activities: development of treatment plan with patient and/or surrogate as well as nursing, discussions with consultants, evaluation of patient's response to treatment, examination of patient, obtaining history from patient or surrogate, ordering and performing treatments and  interventions, ordering and review of laboratory studies, ordering and review of radiographic studies, pulse oximetry and re-evaluation of patient's condition.  Length of Stay: 4  Toribio Fuel, MD  01/28/2024, 10:25 AM  Advanced Heart Failure Team Pager [phone] (M-F; 7a - 5p)   Please visit [email or web address]: For overnight coverage please call cardiology fellow first. If fellow not available call Shock/ECMO MD on call.  For ECMO / Mechanical Support (Impella, IABP, LVAD) issues call Shock / ECMO MD on call.      "

## 2024-01-28 NOTE — Progress Notes (Signed)
 Palliative:  HPI: 69 y.o. male  with past medical history of HFrEF EF 20-25%, ICM, CAD, inferior STEMI 10/2021 s/p DES to RCA, anterior STEMI 11/2022 s/p DES to LAD, CKD stage 2, DM2, HTN, HLD, s/p Biotronik ICD, s/p right thoracentesis  11/25 1.1L admitted on 01/24/2024 with acute on chronic heart failure exacerbation with fluid overload, orthopnea, cough. Admitted to ICU and Swan placed. S/P RHC 1/14. Palliative care requested for ongoing VAD evaluation.    Discussed with RN. I met today with Garrett Roberts. Later joined by his wife and 2 daughters. He is feeling much better. Appetite is improved. Legs without edema. He tells me that he met with Dr. Daniel. He would like to continue to consider VAD. He does not want to pursue VAD while he is feeling this good as he is still functional and has good quality of life. He fears VAD complications that may mess this up and lead to worse quality of life. I acknowledged this fear and that we cannot promise success with VAD but only make recommendations using our experience and knowledge. He is not interested in pursuing VAD now. He would like to continue to consider for the future if he feels worse. I asked him again today if he will be disappointed if he is not a candidate for VAD when he feels bad enough to consider VAD. He tells us  today that he will not be disappointed and will be at peace. He reaffirms that he is not afraid to die. We had a long discussion regarding resuscitation the other day. I brought an article that discussing views on DNR from the Islamic faith. I shared this article with them and am curious what their thoughts will be on this. They all express interest in reading this and discussing further.   There was also discussion of PICC line and milrinone . He refused PICC line as he associates this with having VAD. We discussed the many uses of PICC line and why this is recommended and why internal jugular needs to come out. He is hesitant for PICC noting his  hopes of going home soon so why have PICC placed now. After further discussion of needing to remove internal jugular he is more willing. Family are supportive in proceeding with PICC line. We also spent time discussing milrinone  and how this works. He is not interested in continuing this at home but I anticipate that he would be interested if his quality of life declined and he felt worse or symptomatic off milrinone . He is hopeful for successful weaning of milrinone  and home soon.   All questions/concerns addressed. Emotional support provided.   Exam: Awake, alert, oriented. No distress. R internal jugular Swan. RRR. Breathing regular, unlabored. BLE much improved.   Plan: - Full code - No desire for VAD at this time but open to continuing discussions and considering this in the future - Hopeful for successful weaning of milrinone    60 min  Bernarda Kitty, NP Palliative Medicine Team Pager [phone] (Please see [email or web address] for schedule) Team Phone [phone]

## 2024-01-28 NOTE — Progress Notes (Signed)
 Peripherally Inserted Central Catheter Placement  The IV Nurse has discussed with the patient and/or persons authorized to consent for the patient, the purpose of this procedure and the potential benefits and risks involved with this procedure.  The benefits include less needle sticks, lab draws from the catheter, and the patient may be discharged home with the catheter. Risks include, but not limited to, infection, bleeding, blood clot (thrombus formation), and puncture of an artery; nerve damage and irregular heartbeat and possibility to perform a PICC exchange if needed/ordered by physician.  Alternatives to this procedure were also discussed.  Bard Power PICC patient education guide, fact sheet on infection prevention and patient information card has been provided to patient /or left at bedside.    PICC Placement Documentation  PICC Double Lumen 01/28/24 Right Brachial 38 cm 0 cm (Active)  Indication for Insertion or Continuance of Line Vasoactive infusions 01/28/24 1735  Exposed Catheter (cm) 0 cm 01/28/24 1735  Site Assessment Clean, Dry, Intact 01/28/24 1735  Lumen #1 Status Saline locked;Flushed;Blood return noted 01/28/24 1735  Lumen #2 Status Flushed;Saline locked;Blood return noted 01/28/24 1735  Dressing Type Transparent;Securing device 01/28/24 1735  Dressing Status Antimicrobial disc/dressing in place;Clean, Dry, Intact 01/28/24 1735  Line Care Connections checked and tightened 01/28/24 1735  Line Adjustment (NICU/IV Team Only) No 01/28/24 1735  Dressing Intervention New dressing;Adhesive placed around edges of dressing (IV team/ICU RN only);Adhesive placed at insertion site (IV team only) 01/28/24 1735  Dressing Change Due 02/04/24 01/28/24 1735       Garrett Roberts 01/28/2024, 5:36 PM

## 2024-01-28 NOTE — Progress Notes (Signed)
 PHARMACY - ANTICOAGULATION CONSULT NOTE  Pharmacy Consult for heparin  Indication: atrial fibrillation  Allergies[1]  Patient Measurements: Height: 6' (182.9 cm) Weight: 64.7 kg (142 lb 10.2 oz) IBW/kg (Calculated) : 77.6 HEPARIN  DW (KG): 68  Vital Signs: Temp: 98.4 F (36.9 C) (01/17 2200) BP: 92/60 (01/17 2200) Pulse Rate: 83 (01/17 2200)  Labs: Recent Labs    01/25/24 1011 01/25/24 1455 01/26/24 0512 01/27/24 0529 01/28/24 0535  HGB 12.2*  --  12.9* 12.2* 11.8*  HCT 38.0*  --  39.5 37.8* 36.6*  PLT 193  --  226 216 201  APTT 66*  --  111*  --   --   HEPARINUNFRC  --   --  0.70 0.58 0.58  CREATININE 1.20   < > 1.30* 1.27* 1.22   < > = values in this interval not displayed.    Estimated Creatinine Clearance: 53 mL/min (by C-G formula based on SCr of 1.22 mg/dL).  Assessment: 33 yom presenting with low cardiac output and elevated filling pressures on RHC for diuresis and milrinone . On apixaban  PTA for hx Afib (LD 1/13 AM).    01/28/24: Heparin  level 0.58, therapeutic at heparin  1000 units/hr. No issues with infusion running or signs of bleeding per RN. CBC stable (Hgb 11.8, PLT 201).   Goal of Therapy:  Heparin  level 0.3-0.7 units/ml aPTT 66-102 seconds Monitor platelets by anticoagulation protocol: Yes   Plan:  Continue heparin  infusion at 1000 units/hr Monitor heparin  level, CBC, and s/sx of bleeding daily F/u plans to resume for surgical intervention  Thank you for allowing pharmacy to participate in this patient's care,  Morna Breach, PharmD, BCPS PGY2 Cardiology Pharmacy Resident 01/28/2024 6:45 AM    [1]  Allergies Allergen Reactions   Bovine (Beef) Protein-Containing Drug Products    Porcine (Pork) Protein-Containing Drug Products Other (See Comments)    Religious observance

## 2024-01-29 LAB — BASIC METABOLIC PANEL WITH GFR
Anion gap: 7 (ref 5–15)
BUN: 30 mg/dL — ABNORMAL HIGH (ref 8–23)
CO2: 28 mmol/L (ref 22–32)
Calcium: 8.4 mg/dL — ABNORMAL LOW (ref 8.9–10.3)
Chloride: 100 mmol/L (ref 98–111)
Creatinine, Ser: 1.35 mg/dL — ABNORMAL HIGH (ref 0.61–1.24)
GFR, Estimated: 57 mL/min — ABNORMAL LOW
Glucose, Bld: 172 mg/dL — ABNORMAL HIGH (ref 70–99)
Potassium: 3.9 mmol/L (ref 3.5–5.1)
Sodium: 134 mmol/L — ABNORMAL LOW (ref 135–145)

## 2024-01-29 LAB — COOXEMETRY PANEL
Carboxyhemoglobin: 1.6 % — ABNORMAL HIGH (ref 0.5–1.5)
Methemoglobin: <0.7 % (ref 0.0–1.5)
O2 Saturation: 67.1 %
Total hemoglobin: 11.5 g/dL — ABNORMAL LOW (ref 12.0–16.0)

## 2024-01-29 LAB — CBC
HCT: 32.9 % — ABNORMAL LOW (ref 39.0–52.0)
Hemoglobin: 10.5 g/dL — ABNORMAL LOW (ref 13.0–17.0)
MCH: 27.7 pg (ref 26.0–34.0)
MCHC: 31.9 g/dL (ref 30.0–36.0)
MCV: 86.8 fL (ref 80.0–100.0)
Platelets: 185 K/uL (ref 150–400)
RBC: 3.79 MIL/uL — ABNORMAL LOW (ref 4.22–5.81)
RDW: 17.2 % — ABNORMAL HIGH (ref 11.5–15.5)
WBC: 5.6 K/uL (ref 4.0–10.5)
nRBC: 0 % (ref 0.0–0.2)

## 2024-01-29 LAB — HEPARIN LEVEL (UNFRACTIONATED)
Heparin Unfractionated: >1.1 [IU]/mL — ABNORMAL HIGH (ref 0.30–0.70)
Heparin Unfractionated: 0.46 [IU]/mL (ref 0.30–0.70)

## 2024-01-29 NOTE — Progress Notes (Signed)
 Orthopedic Tech Progress Note Patient Details:  Garrett Roberts 27-Dec-1955 [id number]  Patient refused UNNA BOOTS. Stating he does not like them. Legs look great   Patient ID: Garrett Roberts, male   DOB: [birth year], 69 y.o.   MRN: [id number]  Garrett Roberts Pac 01/29/2024, 11:01 AM

## 2024-01-29 NOTE — Plan of Care (Signed)
  Problem: Cardiovascular: Goal: Ability to achieve and maintain adequate cardiovascular perfusion will improve Outcome: Progressing   

## 2024-01-29 NOTE — Progress Notes (Signed)
 Patient's daughter requested not do have wet to dry dressings. Per her, sacral wound cleansed with vashe, santyl applied, and xeroform gauze with foam placed.

## 2024-01-29 NOTE — Progress Notes (Signed)
 "    Advanced Heart Failure Rounding Note  Cardiologist: [name], MD  AHF Cardiologist: Dr. Cherrie Chief Complaint: End-stage HF Patient Profile   Garrett Roberts is a 69 y.o. male with CAD, DM II, HTN, HLD, ischemic cardiomyopathy/chronic systolic CHF.   Significant events:   1/14: RHC: RA 13, PA 39/18 (26), PCW 20, TD CO/CI 2.81/1.49, PAPi  Subjective:    Remains on milrinone  0.25. Co-ox 67%   CVP 8. Now on PO diuretics.   Does not appear interested in LVAD at this time. Discussed again at the bedside with AHF team.   Objective:    Weight Range: 64 kg Body mass index is 19.15 kg/m.   Vital Signs:   Temp:  [97.7 F (36.5 C)-98.6 F (37 C)] 98.6 F (37 C) (01/19 0500) Pulse Rate:  [77-91] 91 (01/19 0700) Resp:  [11-24] 16 (01/19 0700) BP: (85-107)/(58-72) 105/72 (01/19 0700) SpO2:  [93 %-100 %] 99 % (01/19 0700) Weight:  [64 kg] 64 kg (01/19 0500) Last BM Date :  (PTA)  Weight change: Filed Weights   01/25/24 0500 01/26/24 0515 01/29/24 0500  Weight: 64.6 kg 64.7 kg 64 kg   Intake/Output:  Intake/Output Summary (Last 24 hours) at 01/29/2024 0753 Last data filed at 01/29/2024 0700 Gross per 24 hour  Intake 836.29 ml  Output 2600 ml  Net -1763.71 ml    Physical Exam   General:  elderly appearing.  No respiratory difficulty Neck: JVD flat.  Cor: Regular rate & rhythm. No murmurs. Lungs: clear Extremities: no edema  Neuro: alert & oriented x 3. Affect pleasant.   Telemetry   NSR 70s-80s + intt PVCs (Personally reviewed)    Labs   CBC Recent Labs    01/28/24 0535 01/29/24 0511  WBC 6.2 5.6  HGB 11.8* 10.5*  HCT 36.6* 32.9*  MCV 85.9 86.8  PLT 201 185   Basic Metabolic Panel Recent Labs    98/81/73 0535 01/29/24 0511  NA 136 134*  K 4.1 3.9  CL 99 100  CO2 30 28  GLUCOSE 135* 172*  BUN 26* 30*  CREATININE 1.22 1.35*  CALCIUM  8.8* 8.4*  MG 2.1  --    Liver Function Tests No results for input(s): AST, ALT,  ALKPHOS, BILITOT, PROT, ALBUMIN  in the last 72 hours.  BNP (last 3 results) Recent Labs    07/19/23 1333 08/25/23 1125 10/20/23 1325  BNP 1,374.0* 1,477.8* 1,886.7*   ProBNP (last 3 results) Recent Labs    01/17/24 1236 01/23/24 1118 01/25/24 1456  PROBNP 1,945.0* 3,357.0* 2,392.0*   Medications:    Scheduled Medications:  amiodarone   100 mg Oral Daily   Chlorhexidine  Gluconate Cloth  6 each Topical Daily   digoxin   0.125 mg Oral Daily   docusate sodium   100 mg Oral Daily   empagliflozin   25 mg Oral Daily   ezetimibe   10 mg Oral Daily   feeding supplement  237 mL Oral TID BM   levothyroxine   112 mcg Oral Daily   pantoprazole   40 mg Oral BID AC   rosuvastatin   40 mg Oral Daily   sodium chloride  flush  10-40 mL Intracatheter Q12H   sodium chloride  flush  3 mL Intravenous Q12H   tadalafil   20 mg Oral Daily   torsemide   40 mg Oral Daily    Infusions:  sodium chloride  Stopped (01/28/24 1730)   heparin  1,000 Units/hr (01/29/24 0700)   milrinone  0.25 mcg/kg/min (01/29/24 0700)    PRN Medications: sodium chloride , acetaminophen ,  ondansetron  (ZOFRAN ) IV, mouth rinse, phenol, sodium chloride  flush, sodium chloride  flush  Assessment/Plan   1. 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  - Echo 01/11/24 EF 20-25%, G3DD, mildly reduced RV, mild MR - 1/14: RHC: RA 13, PA 39/18 (26), PCW 20, TD CO/CI 2.81/1.49, PAPi 1.7 - PAC in place; hemodynamics as above. PAPi improved with diuresis - Co-ox 73%. On Milrinone  0.25 mcg/kg/min.Will start slow wean, discussed possibility of failing the wean and possibly going home with milrinone . As long as he is feeling good ideally he would like to avoid milrinone  at home. Decrease to 0.125 mcg/kg/min today.  - CVP 8 . Now on pr torsemide  40 mg daily. Continue for now, may need to lower dose if renal function goes up.  - Hold Jardiance  for now. If final decision is no for LVAD  will restart.  - Continue digoxin  0.125 daily. - Off midodrine , Veroquvo, Toprol  XL and spironolactone  - He has been reluctant about LVAD for quite some time but I think his window is now closing.quickly. He has met with Dr. Daniel from [facility] and felt to be a sufficient operative candidate and was offered a surgical spot this Thurs/Friday. He remains reluctant due to fear of losing his independence. Dr. Cherrie told him that his window to proceed with LVAD is closing and that only other option would be palliative milrinone  which would likely only have short-term benefit - MD spoke with him 1/17 again and was contemplating VAD. However he told PICC RN later the same day that he had no interest in VAD.  - Dr. Daniel and Dr. Cherrie spoke with him again 1/18. Remains resistant to idea of VAD. Says he will come back later when he feels worse. Also not sure if he wants home milrinone  - Discussed again today with Dr. Rolan and Dr. Zenaida. Still reluctant and leaning towards not getting LVAD.    2. CAD:  Hx inferior STEMI 10/23 s/p PCI/DES to RCA. Anterior STEMI 12/10/22, cath showed occluded pLAD treated with PCI/DES, prior RCA stent patent, severe disease OM1 and distal LCx treated medically. No chest pain.  - > 1 year post-PCI, will hold Plavix  pre-potential LVAD.   - No ASA given Eliquis  use.  - Continue rosuvastatin  40 mg + Zetia  10 mg daily.  LDL above goal < 55 in 11/25, would ideally get on Repatha but for now will focus on preparation for LVAD.   - No s/s angina  3. Atrial fibrillation: Paroxysmal.  - In NSR today. On heparin  here, has been on apixaban  at home.  - Continue amiodarone  100 mg daily, recent LFTs and TSH normal.  - no change  4. DM II: Continue Jardiance  (hold if LVAD surgery set up).   5. 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 Not a smoker, no known lung disease. CXR has shown some chronic interstitial changes. HRCT  chest with bibasilar bronchiectasis that may be due to aspiration (though swallow study negative).  V/Q scan showed no chronic PE. Rheumatologic serologies negative. RHC 1/14 with improved PA pressure, only mild pulmonary venous hypertension.  - Continue tadalafil  20 mg daily.    6. Pleural effusion: S/p right thoracentesis 3/25 and again in 11/25. Pleural fluid was transudative by Light's criteria, no malignant cells. Suspect he has reaccumulated pleural fluid.   - CXR/CT with moderate R pleural effusion  -> d/w CCM. Continue to follow. Can tap as needed (if not getting  VAD would drain prior to d/c)  7. GI: History of early satiety, GI discomfort.  CT abdomen/pelvis with gastric wall thickening. GI was consulted and EGD was performed, no definite evidence for malignancy, biopsies taken for H pylori testing and pathology => negative for H pylori and no gastric cancer.  Gastric emptying study normal in 9/25.  Abdominal US  showed normal appearing liver in 9/25.  Virtual colonoscopy unremarkable.  He actually is feeling better with improved appetite.   8. 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  - Continue Levoxyl , recent TSH ok.   9. Fe deficiency anemia: He has had IV FE.   Stable for transfer to floor today.   CRITICAL CARE Performed by: Beckey LITTIE Coe   Total critical care time: 12 minutes  Critical care time was exclusive of separately billable procedures and treating other patients.  Critical care was necessary to treat or prevent imminent or life-threatening deterioration.  Critical care was time spent personally by me on the following activities: development of treatment plan with patient and/or surrogate as well as nursing, discussions with consultants, evaluation of patient's response to treatment, examination of patient, obtaining history from patient or surrogate, ordering and performing treatments and interventions, ordering and review of laboratory studies, ordering and  review of radiographic studies, pulse oximetry and re-evaluation of patient's condition.   Length of Stay: 5  Beckey LITTIE Coe, NP  01/29/2024, 7:53 AM  Advanced Heart Failure Team Pager [phone] (M-F; 7a - 5p)   Please visit [email or web address]: For overnight coverage please call cardiology fellow first. If fellow not available call Shock/ECMO MD on call.  For ECMO / Mechanical Support (Impella, IABP, LVAD) issues call Shock / ECMO MD on call.  "

## 2024-01-29 NOTE — Progress Notes (Signed)
 PHARMACY - ANTICOAGULATION CONSULT NOTE  Pharmacy Consult for heparin  Indication: atrial fibrillation  Allergies[1]  Patient Measurements: Height: 6' (182.9 cm) Weight: 64 kg (141 lb 3.2 oz) IBW/kg (Calculated) : 77.6 HEPARIN  DW (KG): 68  Vital Signs: Temp: 98.6 F (37 C) (01/19 0500) Temp Source: Oral (01/19 0500) BP: 105/72 (01/19 0700) Pulse Rate: 91 (01/19 0700)  Labs: Recent Labs    01/27/24 0529 01/28/24 0535 01/29/24 0511 01/29/24 0544  HGB 12.2* 11.8* 10.5*  --   HCT 37.8* 36.6* 32.9*  --   PLT 216 201 185  --   HEPARINUNFRC 0.58 0.58 >1.10* 0.46  CREATININE 1.27* 1.22 1.35*  --     Estimated Creatinine Clearance: 47.4 mL/min (A) (by C-G formula based on SCr of 1.35 mg/dL (H)).  Assessment: 81 yom presenting with low cardiac output and elevated filling pressures on RHC for diuresis and milrinone . On apixaban  PTA for hx Afib (LD 1/13 AM).    Initial heparin  level this morning drawn from line where heparin  running (>1.1), repeat level 0.46 when drawn correctly.  Hgb slightly lower this AM 12.9 > 11.8 > 10.5. Platelet count stable. No overt bleeding or complications noted.   Goal of Therapy:  Heparin  level 0.3-0.7 units/ml aPTT 66-102 seconds Monitor platelets by anticoagulation protocol: Yes   Plan:  Continue heparin  infusion at 1000 units/hr Monitor heparin  level, CBC, and s/sx of bleeding daily F/u plans to resume for surgical intervention  Harlene Denna Berdine JONETTA ARABELLA, [facility] Clinical Pharmacist  01/29/2024 7:44 AM   [facility] pharmacy phone numbers are listed on [email or web address]      [1]  Allergies Allergen Reactions   Bovine (Beef) Protein-Containing Drug Products    Porcine (Pork) Protein-Containing Drug Products Other (See Comments)    Religious observance

## 2024-01-30 LAB — CBC
HCT: 34.5 % — ABNORMAL LOW (ref 39.0–52.0)
Hemoglobin: 11.4 g/dL — ABNORMAL LOW (ref 13.0–17.0)
MCH: 27.9 pg (ref 26.0–34.0)
MCHC: 33 g/dL (ref 30.0–36.0)
MCV: 84.6 fL (ref 80.0–100.0)
Platelets: 205 K/uL (ref 150–400)
RBC: 4.08 MIL/uL — ABNORMAL LOW (ref 4.22–5.81)
RDW: 17 % — ABNORMAL HIGH (ref 11.5–15.5)
WBC: 6 K/uL (ref 4.0–10.5)
nRBC: 0 % (ref 0.0–0.2)

## 2024-01-30 LAB — HEPARIN LEVEL (UNFRACTIONATED)
Heparin Unfractionated: 0.82 [IU]/mL — ABNORMAL HIGH (ref 0.30–0.70)
Heparin Unfractionated: 0.46 [IU]/mL (ref 0.30–0.70)

## 2024-01-30 LAB — BASIC METABOLIC PANEL WITH GFR
Anion gap: 8 (ref 5–15)
BUN: 31 mg/dL — ABNORMAL HIGH (ref 8–23)
CO2: 28 mmol/L (ref 22–32)
Calcium: 8.9 mg/dL (ref 8.9–10.3)
Chloride: 97 mmol/L — ABNORMAL LOW (ref 98–111)
Creatinine, Ser: 1.25 mg/dL — ABNORMAL HIGH (ref 0.61–1.24)
GFR, Estimated: >60 mL/min
Glucose, Bld: 146 mg/dL — ABNORMAL HIGH (ref 70–99)
Potassium: 4.1 mmol/L (ref 3.5–5.1)
Sodium: 134 mmol/L — ABNORMAL LOW (ref 135–145)

## 2024-01-30 LAB — COOXEMETRY PANEL
Carboxyhemoglobin: 1.2 % (ref 0.5–1.5)
Carboxyhemoglobin: 0.6 % (ref 0.5–1.5)
Methemoglobin: <0.7 % (ref 0.0–1.5)
Methemoglobin: <0.7 % (ref 0.0–1.5)
O2 Saturation: 63.5 %
O2 Saturation: 66.9 %
Total hemoglobin: 11.8 g/dL — ABNORMAL LOW (ref 12.0–16.0)
Total hemoglobin: 12.4 g/dL (ref 12.0–16.0)

## 2024-01-30 MED ORDER — EMPAGLIFLOZIN 10 MG PO TABS
10.0000 mg | ORAL_TABLET | Freq: Every day | ORAL | Status: DC
  Administered 2024-01-30 – 2024-01-31 (×2): 10 mg via ORAL
  Filled 2024-01-30 (×2): qty 1

## 2024-01-30 MED ORDER — GUAIFENESIN-DM 100-10 MG/5ML PO SYRP
5.0000 mL | ORAL_SOLUTION | ORAL | Status: DC | PRN
  Administered 2024-01-30 – 2024-01-31 (×2): 5 mL via ORAL
  Filled 2024-01-30 (×2): qty 5

## 2024-01-30 NOTE — Plan of Care (Signed)
" °  Problem: Health Behavior/Discharge Planning: Goal: Ability to safely manage health-related needs after discharge will improve Outcome: Progressing   Problem: Education: Goal: Knowledge of General Education information will improve Description: Including pain rating scale, medication(s)/side effects and non-pharmacologic comfort measures Outcome: Progressing   Problem: Clinical Measurements: Goal: Diagnostic test results will improve Outcome: Progressing Goal: Respiratory complications will improve Outcome: Progressing Goal: Cardiovascular complication will be avoided Outcome: Progressing   Problem: Nutrition: Goal: Adequate nutrition will be maintained Outcome: Progressing   Problem: Elimination: Goal: Will not experience complications related to bowel motility Outcome: Progressing Goal: Will not experience complications related to urinary retention Outcome: Progressing   Problem: Pain Managment: Goal: General experience of comfort will improve and/or be controlled Outcome: Progressing   "

## 2024-01-30 NOTE — TOC Initial Note (Signed)
 Transition of Care [facility]) - Initial/Assessment Note    Patient Details  Name: Garrett Roberts MRN: [id number] Date of Birth: Sep 25, 1955  [facility]) CM/SW Contact:    Arlana JINNY Nicholaus [place] Phone Number: [phone] 01/30/2024, 10:12 AM  Clinical Narrative:        HF CSW met with patient at bedside. Patient stated that he lives with spouse and 3 daughters. Patient stated that he drives. Patient stated that he does not have any history of HH services. Patient stated that he does not use any equipment. Patient stated that he has a scale at home. Patient stated that he has a PCP. CSW explained that a hospital follow up appointment is typically scheduled closer towards dc. Patient is agreeable. Family will provide transportation at dc.   HF CSW/CM will continue to follow and monitor for dc readiness.            Expected Discharge Plan: Home/Self Care Barriers to Discharge: Continued Medical Work up   Patient Goals and CMS Choice Patient states their goals for this hospitalization and ongoing recovery are:: Get better CMS Medicare.gov Compare Post Acute Care list provided to:: Patient Choice offered to / list presented to : Patient [place] ownership interest in [facility]provided to:: Patient    Expected Discharge Plan and Services       Living arrangements for the past 2 months: Single Family Home                                      Prior Living Arrangements/Services Living arrangements for the past 2 months: Single Family Home Lives with:: Spouse, Adult Children Patient language and need for interpreter reviewed:: Yes Do you feel safe going back to the place where you live?: Yes      Need for Family Participation in Patient Care: No (Comment) Care giver support system in place?: Yes (comment)   Criminal Activity/Legal Involvement Pertinent to Current Situation/Hospitalization: No - Comment as needed  Activities of Daily Living    ADL Screening (condition at time of admission) Independently performs ADLs?: No Does the patient have a NEW difficulty with bathing/dressing/toileting/self-feeding that is expected to last >3 days?: No Does the patient have a NEW difficulty with getting in/out of bed, walking, or climbing stairs that is expected to last >3 days?: No Does the patient have a NEW difficulty with communication that is expected to last >3 days?: No Is the patient deaf or have difficulty hearing?: No Does the patient have difficulty seeing, even when wearing glasses/contacts?: No Does the patient have difficulty concentrating, remembering, or making decisions?: No  Permission Sought/Granted Permission sought to share information with : PCP, Family Supports                Emotional Assessment Appearance:: Appears stated age Attitude/Demeanor/Rapport: Engaged Affect (typically observed): Appropriate Orientation: : Oriented to Self, Oriented to Place, Oriented to  Time, Oriented to Situation Alcohol / Substance Use: Not Applicable Psych Involvement: No (comment)  Admission diagnosis:  CHF (congestive heart failure) (HCC) [I50.9] Patient Active Problem List   Diagnosis Date Noted   CHF (congestive heart failure) (HCC) 01/24/2024   Anemia, unspecified 10/23/2023   Iron deficiency anemia 09/12/2023   Chronic abdominal pain 09/01/2023   Elevated serum GGT level 09/01/2023   Epigastric pain 07/12/2023   HFrEF (heart failure with reduced ejection fraction) (HCC) 06/30/2023   Recurrent right pleural  effusion 06/30/2023   Chronic hypotension 06/30/2023   Renal insufficiency 06/30/2023   Hypothyroidism 06/30/2023   GERD (gastroesophageal reflux disease) 06/30/2023   Presence of heart assist device (HCC) 06/12/2023   Chronic systolic heart failure (HCC) 06/01/2023   Poor appetite 04/19/2023   Hospital discharge follow-up 04/05/2023   Subclinical hypothyroidism 04/05/2023   Loss of weight 03/23/2023    Protein-calorie malnutrition, severe 03/22/2023   Acute on chronic systolic CHF (congestive heart failure) (HCC) 02/17/2023   Ischemic cardiomyopathy 12/19/2022   Chronic HFrEF (heart failure with reduced ejection fraction) (HCC) 12/19/2022   CKD (chronic kidney disease), 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   Type 2 diabetes mellitus with complication, without long-term current use of insulin  (HCC) 12/15/2022   Atrial fibrillation (HCC) 12/14/2022   Constipation 12/12/2022   Left sided abdominal pain 12/11/2022   Abnormal finding on GI tract imaging 12/11/2022   CAD (coronary artery disease) 10/11/2022   Essential hypertension 10/11/2022   Hyperlipidemia 10/11/2022   Vitamin D deficiency 08/03/2015   PCP:  Vincente Shivers, NP Pharmacy:   [facility] [address] [place] [address] Phone: [phone] Fax: [phone]  [facility] - [place], [place] - [address] [place] [address] Phone: [phone] Fax: [phone]  [place] [facility] [address] [place] [address] Phone: [phone] Fax: [phone]     Social Drivers of Health (SDOH) Social History: SDOH Screenings   Food Insecurity: Food Insecurity Present (01/27/2024)  Housing: Low Risk (01/27/2024)  Transportation Needs: No Transportation Needs (01/27/2024)  Utilities: Not At Risk (01/27/2024)  Alcohol Screen: Low Risk (07/12/2023)  Depression (PHQ2-9): Low Risk (09/01/2023)  Recent Concern: Depression (PHQ2-9) - Medium Risk (07/05/2023)  Financial Resource Strain: High Risk (12/08/2023)  Physical Activity: Insufficiently Active (12/08/2023)  Social Connections: Socially Integrated (01/27/2024)  Stress: No Stress Concern Present (12/08/2023)  Tobacco Use: Low Risk (01/26/2024)  Health Literacy: Adequate Health Literacy (07/12/2023)   SDOH Interventions:     Readmission Risk Interventions     No data to display

## 2024-01-30 NOTE — Progress Notes (Signed)
 PHARMACY - ANTICOAGULATION CONSULT NOTE  Pharmacy Consult for heparin  Indication: atrial fibrillation  Allergies[1]  Patient Measurements: Height: 6' (182.9 cm) Weight: 63.4 kg (139 lb 11.2 oz) IBW/kg (Calculated) : 77.6 HEPARIN  DW (KG): 68  Vital Signs: Temp: 97.8 F (36.6 C) (01/20 0724) Temp Source: Oral (01/20 0724) BP: 97/59 (01/20 0724) Pulse Rate: 85 (01/20 0947)  Labs: Recent Labs    01/28/24 0535 01/29/24 0511 01/29/24 0544 01/30/24 0449 01/30/24 0939  HGB 11.8* 10.5*  --  11.4*  --   HCT 36.6* 32.9*  --  34.5*  --   PLT 201 185  --  205  --   HEPARINUNFRC 0.58 >1.10* 0.46 0.82* 0.46  CREATININE 1.22 1.35*  --  1.25*  --     Estimated Creatinine Clearance: 50.7 mL/min (A) (by C-G formula based on SCr of 1.25 mg/dL (H)).  Assessment: 66 yom presenting with low cardiac output and elevated filling pressures on RHC for diuresis and milrinone . On apixaban  PTA for hx Afib (LD 1/13 AM).    Initial heparin  level this morning likely drawn from line where heparin  running (0.82), repeat level 0.46 when drawn correctly as peripheral stick.  Hgb 11.4, plt 205. No s/sx of bleeding or infusion issues.   Goal of Therapy:  Heparin  level 0.3-0.7 units/ml aPTT 66-102 seconds Monitor platelets by anticoagulation protocol: Yes   Plan:  Continue heparin  infusion at 1000 units/hr Monitor heparin  level, CBC, and s/sx of bleeding daily F/u plans to resume for surgical intervention  Thank you for allowing pharmacy to participate in this patient's care,  Suzen Sour, PharmD, BCCCP Clinical Pharmacist  Phone: [phone] 01/30/2024 10:41 AM  Please check AMION for all [facility] Pharmacy phone numbers After 10:00 PM, call Main Pharmacy [phone]      [1]  Allergies Allergen Reactions   Bovine (Beef) Protein-Containing Drug Products    Porcine (Pork) Protein-Containing Drug Products Other (See Comments)    Religious observance

## 2024-01-30 NOTE — Progress Notes (Signed)
 Palliative:  HPI: 69 y.o. 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 Required intubation 8/12.   I met today with 3 daughters, sister, brother, and Inge Lecher NP PCCM. We had discussion regarding path forward for one way extubation. We reviewed poor prognosis and expectation that Garrett Roberts will not do well once extubated. Family reiterate goal that he not suffer. We reviewed option to have medication available if he is having distress vs having medication already onboard to prevent distress. Family would like to have medication onboard to minimize any suffering during transition off ventilator. They would like to move forward with extubation later today and will have the rest of the family come to visit prior to extubation.   I reviewed plans and symptom management recommendations with RN.   Update: I was present with family prior to additional support as well as throughout extubation. Family appropriately tearful. Assisted to ensure comfort. I left family to visit privately with Garrett Roberts after he is resting comfortably after extubation.   All questions/concerns addressed. Emotional support provided. Much time coordinating care with RN, PCCM, and family.   Exam: Sedated on vent. FiO2 50%. Breathing regular, unlabored on vent. No distress. Not following commands. Abd soft. Warm to touch.   Plan:  - DNR - Extubated to full comfort care - Anticipate hospital death  100 min  Bernarda Kitty, NP Palliative Medicine Team Pager [phone] (Please see [email or web address] for schedule) Team Phone [phone]

## 2024-01-30 NOTE — Progress Notes (Signed)
 Physical Therapy Discharge Patient Details Name: Garrett Roberts MRN: [id number] DOB: 04-26-1955 Today's Date: 01/30/2024 Time: 8540-8540 PT Time Calculation (min) (ACUTE ONLY): 0 min  Patient discharged from PT services secondary to pt was receiving PT to strengthen prior to LVAD placement. Pt has decided against surgery and is completely independent with his mobility .Pt has no further PT or equipment needs.   Please see latest therapy progress note for current level of functioning and progress toward goals.    Garrett Roberts PT, DPT Acute Rehabilitation Services Please use secure chat or  Call Office [phone]   Garrett Roberts 01/30/2024, 3:00 PM

## 2024-01-30 NOTE — Plan of Care (Signed)
" °  Problem: Education: Goal: Understanding of CV disease, CV risk reduction, and recovery process will improve Outcome: Progressing Goal: Individualized Educational Video(s) Outcome: Progressing   Problem: Activity: Goal: Ability to return to baseline activity level will improve Outcome: Progressing   Problem: Cardiovascular: Goal: Ability to achieve and maintain adequate cardiovascular perfusion will improve Outcome: Progressing Goal: Vascular access site(s) Level 0-1 will be maintained Outcome: Progressing   Problem: Education: Goal: Knowledge of General Education information will improve Description: Including pain rating scale, medication(s)/side effects and non-pharmacologic comfort measures Outcome: Progressing   Problem: Health Behavior/Discharge Planning: Goal: Ability to manage health-related needs will improve Outcome: Progressing   Problem: Clinical Measurements: Goal: Ability to maintain clinical measurements within normal limits will improve Outcome: Progressing Goal: Will remain free from infection Outcome: Progressing Goal: Diagnostic test results will improve Outcome: Progressing Goal: Respiratory complications will improve Outcome: Progressing Goal: Cardiovascular complication will be avoided Outcome: Progressing   Problem: Activity: Goal: Risk for activity intolerance will decrease Outcome: Progressing   Problem: Nutrition: Goal: Adequate nutrition will be maintained Outcome: Progressing   Problem: Elimination: Goal: Will not experience complications related to bowel motility Outcome: Progressing Goal: Will not experience complications related to urinary retention Outcome: Progressing   "

## 2024-01-30 NOTE — Progress Notes (Signed)
 "    Advanced Heart Failure Rounding Note  Cardiologist: [name], MD  AHF Cardiologist: Dr. Cherrie Chief Complaint: End-stage HF Patient Profile   Garrett Roberts is a 69 y.o. male with CAD, DM II, HTN, HLD, ischemic cardiomyopathy/chronic systolic CHF.   Significant events:   1/14: RHC: RA 13, PA 39/18 (26), PCW 20, TD CO/CI 2.81/1.49, PAPi  Subjective:    Co-ox 64% on milrinone  0.25. CVP 7. Weight down. sCr 1.35>1.25.   Feeling well this morning. In bed, family at bedside. No complaints.   Objective:    Weight Range: 63.4 kg Body mass index is 18.95 kg/m.   Vital Signs:   Temp:  [97.7 F (36.5 C)-98.2 F (36.8 C)] 97.8 F (36.6 C) (01/20 0724) Pulse Rate:  [77-89] 85 (01/20 0947) Resp:  [11-23] 19 (01/20 0724) BP: (87-97)/(51-65) 97/59 (01/20 0724) SpO2:  [96 %-100 %] 100 % (01/20 0724) Weight:  [63.4 kg] 63.4 kg (01/20 0428) Last BM Date : 01/29/24  Weight change: Filed Weights   01/26/24 0515 01/29/24 0500 01/30/24 0428  Weight: 64.7 kg 64 kg 63.4 kg   Intake/Output:  Intake/Output Summary (Last 24 hours) at 01/30/2024 1030 Last data filed at 01/30/2024 0951 Gross per 24 hour  Intake 1322.51 ml  Output 2450 ml  Net -1127.49 ml    Physical Exam   General: Elderly appearing. No distress  Cardiac: JVP flat. No murmurs  Extremities: Warm and dry.  No edema.  Neuro: A&O x3. Affect pleasant.   Telemetry   SR 80s with occasional PVCs (personally reviewed)  Labs   CBC Recent Labs    01/29/24 0511 01/30/24 0449  WBC 5.6 6.0  HGB 10.5* 11.4*  HCT 32.9* 34.5*  MCV 86.8 84.6  PLT 185 205   Basic Metabolic Panel Recent Labs    98/81/73 0535 01/29/24 0511 01/30/24 0449  NA 136 134* 134*  K 4.1 3.9 4.1  CL 99 100 97*  CO2 30 28 28   GLUCOSE 135* 172* 146*  BUN 26* 30* 31*  CREATININE 1.22 1.35* 1.25*  CALCIUM  8.8* 8.4* 8.9  MG 2.1  --   --    Liver Function Tests No results for input(s): AST, ALT, ALKPHOS,  BILITOT, PROT, ALBUMIN  in the last 72 hours.  BNP (last 3 results) Recent Labs    07/19/23 1333 08/25/23 1125 10/20/23 1325  BNP 1,374.0* 1,477.8* 1,886.7*   ProBNP (last 3 results) Recent Labs    01/17/24 1236 01/23/24 1118 01/25/24 1456  PROBNP 1,945.0* 3,357.0* 2,392.0*   Medications:    Scheduled Medications:  amiodarone   100 mg Oral Daily   Chlorhexidine  Gluconate Cloth  6 each Topical Daily   digoxin   0.125 mg Oral Daily   docusate sodium   100 mg Oral Daily   ezetimibe   10 mg Oral Daily   feeding supplement  237 mL Oral TID BM   levothyroxine   112 mcg Oral Daily   pantoprazole   40 mg Oral BID AC   rosuvastatin   40 mg Oral Daily   sodium chloride  flush  10-40 mL Intracatheter Q12H   sodium chloride  flush  3 mL Intravenous Q12H   tadalafil   20 mg Oral Daily   torsemide   40 mg Oral Daily    Infusions:  sodium chloride  Stopped (01/28/24 1730)   heparin  1,000 Units/hr (01/29/24 2100)   milrinone  0.125 mcg/kg/min (01/30/24 0622)    PRN Medications: sodium chloride , acetaminophen , ondansetron  (ZOFRAN ) IV, mouth rinse, phenol, sodium chloride  flush, sodium chloride  flush  Assessment/Plan  1. 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  - Echo 01/11/24 EF 20-25%, G3DD, mildly reduced RV, mild MR - 1/14: RHC: RA 13, PA 39/18 (26), PCW 20, TD CO/CI 2.81/1.49, PAPi 1.7 - PAC in place; hemodynamics as above. PAPi improved with diuresis - Co-ox 64% on Milrinone  0.25 mcg/kg/min. Wean to 0.125. Discussed possibility of failing the wean and possibly going home with milrinone . As long as he is feeling good ideally he would like to avoid milrinone  at home.  - CVP 7. Continue torsemide  40 mg daily - Restart jardiance  10 mg daily; no LVAD - Continue digoxin  0.125 daily - Off midodrine , Veroquvo, Toprol  XL and spironolactone  - He continues to be reluctant about LVAD, has had numerous extensive conversations with  multiple MD from both HF and TCTS. Has told palliative care that he does not want the device. He met with Dr. Daniel from [facility] and felt to be a sufficient operative candidate and was offered a surgical spot this Thurs/Friday. If fails milrinone  wean will consider palliative milrinone , which would likely only have short-term benefit - Dr. Daniel and Dr. Cherrie spoke with him again 1/18. Remains resistant to idea of VAD. Says he will come back later when he feels worse. Also not sure if he wants home milrinone    2. CAD:  Hx inferior STEMI 10/23 s/p PCI/DES to RCA. Anterior STEMI 12/10/22, cath showed occluded pLAD treated with PCI/DES, prior RCA stent patent, severe disease OM1 and distal LCx treated medically. No chest pain.  - > 1 year post-PCI, will hold Plavix  pre-potential LVAD.   - No ASA given Eliquis  use.  - Continue rosuvastatin  40 mg + Zetia  10 mg daily.  LDL above goal < 55 in 11/25, would ideally get on Repatha but for now will focus on preparation for LVAD.   - No s/s angina  3. Atrial fibrillation: Paroxysmal.  - In NSR today. On heparin  here, has been on apixaban  at home. Start after effusion drained - Continue amiodarone  100 mg daily, recent LFTs and TSH normal.  - no change  4. DM II: Continue Jardiance  (hold if LVAD surgery set up).   5. 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 Not a smoker, no known lung disease. CXR has shown some chronic interstitial changes. HRCT chest with bibasilar bronchiectasis that may be due to aspiration (though swallow study negative).  V/Q scan showed no chronic PE. Rheumatologic serologies negative. RHC 1/14 with improved PA pressure, only mild pulmonary venous hypertension.  - Continue tadalafil  20 mg daily.    6. Pleural effusion: S/p right thoracentesis 3/25 and again in 11/25. Pleural fluid was transudative by Light's criteria, no malignant cells. Suspect he has reaccumulated pleural fluid.    - CXR/CT with moderate R pleural effusion  > d/w CCM. Continue to follow. Can tap as needed (if not getting VAD would drain prior to d/c)  7. GI: History of early satiety, GI discomfort.  CT abdomen/pelvis with gastric wall thickening. GI was consulted and EGD was performed, no definite evidence for malignancy, biopsies taken for H pylori testing and pathology => negative for H pylori and no gastric cancer.  Gastric emptying study normal in 9/25.  Abdominal US  showed normal appearing liver in 9/25.  Virtual colonoscopy unremarkable.  He actually is feeling better with improved appetite.   8. 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  - Continue Levoxyl , recent TSH ok.   9. Fe deficiency  anemia: He has had IV FE.   Length of Stay: 6  Nonie Lochner, NP  01/30/2024, 10:30 AM  Advanced Heart Failure Team Pager [phone] (M-F; 7a - 5p)   Please visit [email or web address]: For overnight coverage please call cardiology fellow first. If fellow not available call Shock/ECMO MD on call.  For ECMO / Mechanical Support (Impella, IABP, LVAD) issues call Shock / ECMO MD on call.  "

## 2024-01-30 NOTE — Discharge Instructions (Signed)
 [facility] [address] [place] [address] [phone] PANTRY 3rd Th: 9a - 12p; 3rd Th: 4p - 7p  [employer] [facility] [address] [place] [address] [phone] PANTRY varies - call agency  [address] [place] [facility] [address] [place] [address] [phone] PANTRY 2nd & 4th Saturday 11am-1pm  [address] [place] [address] [phone] PANTRY by appointment Sa: 10:30a - 11:30a  [facility] [address] [place] [address] [phone] PANTRY Sa: 10a - 12p  on varying weeks; call agency for schedule  [facility] [address] [place] [address] [phone] PANTRY Tu : 10a - 12p; W: 10a - 12p; Th: 10a - 12p  [facility] Anitra Donnama[facility] [address] [place] [address] [phone] PANTRY 1st & 3rd Tu: 10a - 1p; 3rd Sa: 10a - 12p  [facility] [address] [place] [address] [phone] PANTRY W (By Appointment only - call prior to W to make an appointment)  [facility] [address] [place] [address] [phone] MARINE MASSING M - F: times and dates vary - call agency  [facility] [address] [facility] [address] [phone] PANTRY 1st & 3rd Th: 10a-2p  [facility] [address] [place] [address] [phone] PANTRY 3rd Sat of month: 11a-1p  [facility] [employer] [address] [place] [address] [phone] MARINE MASSING 4th [place]: 11a - 2p  [facility] / [facility] [address] [place] [address] [phone] PANTRY weekly on W 9a-10:30a  [employer] [facility] [address] [place] [address] [phone] PANTRY 2nd & 4th Th: 1p - 2:30p  [facility] [address] [place] [address] [phone] PANTRY 1st & 3rd F: 9a-12p; 2nd&4th Sa: 9a-12p  [facility] [address] [place] [address] [phone]  PANTRY T-TH 9am-2pm   [facility] [phone] [address] [place] [address] [phone] PANTRY Thursdays 10am-12:30pm  [employer] [facility] [address] [place] [address] [phone] PANTRY 4th Sa: 9a - [address] [facility] [place]ATRICE [place] [address] [phone] PANTRY 2nd M 5:30p - 7:30p; each W 9:30a - 11:30a  [facility] [address] [place] [address] [phone] PANTRY Tues 11a-1p call for appointment preferred  [facility] [address] [place] [address] [phone] PANTRY mobile distribution sites; call agency for listing  [facility] [address] [place] [address] [phone] PANTRY W: 9a-11a  [facility] [address] [place] [address] [phone] ` T: 12:p -2p  [facility] [address] [place] [address] [phone] PANTRY M: 9:30a - 2:30p; Tu: 9:30a - 2:30p; W: 9:30a - 2:30p; Th: 9:30a - 2:30p  [facility] [address] [place] [address] [phone] PANTRY F: 11a - 2p  [facility] [address] [place] [address] [phone] PANTRY mobile distribution sites; call agency for listing  [facility] [address] [place] [address] [phone] PANTRY M & W 6:30p - 8:30p  Pop up on Th 11a - 12:30p at different locations (call)  [facility] [address] [place] [address] [phone] SOUP KITCHEN M & Th: 6p-9p  [facility] [address] [place] [address] [phone], [place] 3rd Thursday 2:30-3:30  [facility] [address] [place] [address]  [phone] PANTRY Tu: 9a - 11a; Th: 9a - 11a  [facility] [address] [address] [place] [address] [phone] PANTRY 1st & 3rd Sa: 9a - 12p  [facility] [address] [place] [address] [phone] PANTRY Tu: 9a - 12p; Th 9a - 12p  [employer] [address] [place] [address] [phone] PANTRY 4th Saturday: 10a - 12p  [facility] [address] [place] [address] [phone] PANTRY W: 9a - 12p  [facility] [address] [employer] [facility] [address] [phone] PANTRY M - F: 11a-3p  [facility] [address] [place] [address] [phone] PANTRY Fridays 11am-2pm  [facility] [address] [place]  [address] [place] [address] [phone] PANTRY 3rd Tu: 5p - 7p; 3rd Sa: 9a - 1p  [employer] [facility] [address] [place] [address] [phone] PANTRY 3rd Sa: 9a-12p

## 2024-01-31 ENCOUNTER — Other Ambulatory Visit (HOSPITAL_COMMUNITY): Payer: Self-pay

## 2024-01-31 ENCOUNTER — Inpatient Hospital Stay (HOSPITAL_COMMUNITY)

## 2024-01-31 LAB — HEPARIN LEVEL (UNFRACTIONATED): Heparin Unfractionated: 0.35 [IU]/mL (ref 0.30–0.70)

## 2024-01-31 LAB — CBC
HCT: 36.1 % — ABNORMAL LOW (ref 39.0–52.0)
Hemoglobin: 11.7 g/dL — ABNORMAL LOW (ref 13.0–17.0)
MCH: 27.6 pg (ref 26.0–34.0)
MCHC: 32.4 g/dL (ref 30.0–36.0)
MCV: 85.1 fL (ref 80.0–100.0)
Platelets: 216 K/uL (ref 150–400)
RBC: 4.24 MIL/uL (ref 4.22–5.81)
RDW: 17 % — ABNORMAL HIGH (ref 11.5–15.5)
WBC: 6.5 K/uL (ref 4.0–10.5)
nRBC: 0 % (ref 0.0–0.2)

## 2024-01-31 LAB — COOXEMETRY PANEL
Carboxyhemoglobin: 1.4 % (ref 0.5–1.5)
Methemoglobin: <0.7 % (ref 0.0–1.5)
O2 Saturation: 61 %
Total hemoglobin: 12.1 g/dL (ref 12.0–16.0)

## 2024-01-31 LAB — BASIC METABOLIC PANEL WITH GFR
Anion gap: 9 (ref 5–15)
BUN: 31 mg/dL — ABNORMAL HIGH (ref 8–23)
CO2: 28 mmol/L (ref 22–32)
Calcium: 9.4 mg/dL (ref 8.9–10.3)
Chloride: 96 mmol/L — ABNORMAL LOW (ref 98–111)
Creatinine, Ser: 1.21 mg/dL (ref 0.61–1.24)
GFR, Estimated: >60 mL/min
Glucose, Bld: 126 mg/dL — ABNORMAL HIGH (ref 70–99)
Potassium: 4.3 mmol/L (ref 3.5–5.1)
Sodium: 134 mmol/L — ABNORMAL LOW (ref 135–145)

## 2024-01-31 MED ORDER — APIXABAN 5 MG PO TABS
5.0000 mg | ORAL_TABLET | Freq: Two times a day (BID) | ORAL | Status: DC
  Administered 2024-01-31: 5 mg via ORAL
  Filled 2024-01-31: qty 1

## 2024-01-31 MED ORDER — TORSEMIDE 20 MG PO TABS
60.0000 mg | ORAL_TABLET | Freq: Every day | ORAL | 6 refills | Status: DC
  Filled 2024-01-31: qty 60, 20d supply, fill #0

## 2024-01-31 MED ORDER — POTASSIUM CHLORIDE CRYS ER 20 MEQ PO TBCR
20.0000 meq | EXTENDED_RELEASE_TABLET | Freq: Every day | ORAL | 6 refills | Status: DC
  Filled 2024-01-31: qty 90, 90d supply, fill #0

## 2024-01-31 MED ORDER — DOCUSATE SODIUM 100 MG PO CAPS
100.0000 mg | ORAL_CAPSULE | Freq: Every day | ORAL | 0 refills | Status: AC
  Filled 2024-01-31: qty 10, 10d supply, fill #0

## 2024-01-31 MED ORDER — TORSEMIDE 20 MG PO TABS
40.0000 mg | ORAL_TABLET | Freq: Every day | ORAL | 6 refills | Status: DC
  Filled 2024-01-31: qty 60, 30d supply, fill #0

## 2024-01-31 NOTE — Progress Notes (Addendum)
 PHARMACY - ANTICOAGULATION CONSULT NOTE  Pharmacy Consult for heparin  Indication: atrial fibrillation  Allergies[1]  Patient Measurements: Height: 6' (182.9 cm) Weight: 64.3 kg (141 lb 12.1 oz) IBW/kg (Calculated) : 77.6 HEPARIN  DW (KG): 68  Vital Signs: Temp: 97.8 F (36.6 C) (01/21 0808) Temp Source: Oral (01/21 0808) BP: 94/65 (01/21 0808) Pulse Rate: 78 (01/21 0511)  Labs: Recent Labs    01/29/24 0511 01/29/24 0544 01/30/24 0449 01/30/24 0939 01/31/24 0500  HGB 10.5*  --  11.4*  --  11.7*  HCT 32.9*  --  34.5*  --  36.1*  PLT 185  --  205  --  216  HEPARINUNFRC >1.10*   < > 0.82* 0.46 0.35  CREATININE 1.35*  --  1.25*  --  1.21   < > = values in this interval not displayed.    Estimated Creatinine Clearance: 53.1 mL/min (by C-G formula based on SCr of 1.21 mg/dL).  Assessment: 39 yom presenting with low cardiac output and elevated filling pressures on RHC for diuresis and milrinone . On apixaban  PTA for hx Afib (LD 1/13 AM).    Heparin  level is therapeutic at 0.35, on 1000 units/hr. Hgb 11.7, plt 216. No s/sx of bleeding or infusion issues.   Goal of Therapy:  Heparin  level 0.3-0.7 units/ml aPTT 66-102 seconds Monitor platelets by anticoagulation protocol: Yes   Plan:  Continue heparin  infusion at 1000 units/hr Monitor heparin  level, CBC, and s/sx of bleeding daily F/u plans to resume for surgical intervention  ADDENDUM No further procedures planned - okay to restart apixaban  5 mg BID given age<80, wt>60 kg, and Scr <1.5. Will stop heparin  infusion at the time of apixaban  start.   Thank you for allowing pharmacy to participate in this patient's care,  Suzen Sour, PharmD, BCCCP Clinical Pharmacist  Phone: [phone] 01/31/2024 8:30 AM  Please check AMION for all [facility] Pharmacy phone numbers After 10:00 PM, call Main Pharmacy [phone]     [1]  Allergies Allergen Reactions   Bovine (Beef) Protein-Containing Drug Products    Porcine (Pork)  Protein-Containing Drug Products Other (See Comments)    Religious observance

## 2024-01-31 NOTE — TOC Transition Note (Signed)
 Transition of Care [facility]) - Discharge Note   Patient Details  Name: Garrett Roberts MRN: [id number] Date of Birth: 1955-09-24  [facility]) CM/SW Contact:  Roxie KANDICE Stain, RN Phone Number: 01/31/2024, 3:09 PM   Clinical Narrative:     Garrett Roberts is stable to discharge home. Follow up apt on AVS. No ICM (Inpatient Care Management) needs at this time.   Final next level of care: Home/Self Care Barriers to Discharge: Barriers Resolved   Patient Goals and CMS Choice Patient states their goals for this hospitalization and ongoing recovery are:: Get better CMS Medicare.gov Compare Post Acute Care list provided to:: Patient Choice offered to / list presented to : Patient [place] ownership interest in [facility].provided to:: Patient    Discharge Placement                   Home    Discharge Plan and Services Additional resources added to the After Visit Summary for                                       Social Drivers of Health (SDOH) Interventions SDOH Screenings   Food Insecurity: Food Insecurity Present (01/27/2024)  Housing: Low Risk (01/27/2024)  Transportation Needs: No Transportation Needs (01/27/2024)  Utilities: Not At Risk (01/27/2024)  Alcohol Screen: Low Risk (07/12/2023)  Depression (PHQ2-9): Low Risk (09/01/2023)  Recent Concern: Depression (PHQ2-9) - Medium Risk (07/05/2023)  Financial Resource Strain: High Risk (12/08/2023)  Physical Activity: Insufficiently Active (12/08/2023)  Social Connections: Socially Integrated (01/27/2024)  Stress: No Stress Concern Present (12/08/2023)  Tobacco Use: Low Risk (01/26/2024)  Health Literacy: Adequate Health Literacy (07/12/2023)     Readmission Risk Interventions     No data to display

## 2024-01-31 NOTE — Discharge Summary (Signed)
 Advanced Heart Failure Team  Discharge Summary   Patient ID: Garrett Roberts MRN: [id number], DOB/AGE: January 17, 1955 69 y.o. 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 male with CAD, DM II, HTN, HLD, ischemic cardiomyopathy/chronic systolic CHF.   Admitted with End Stage A/C HFrEF and low output HF. Failed outpatient diuresis. Set up for RHC the next day which showed high filling pressures and low output HF. Started on IV lasix  and milrinone  gtt. Diuresed well. Workup for advanced therapies revisited during this admission. Despite numerous interdisciplinary conversations he was reluctant to undergo LVAD placement. Successfully able to wean off milrinone . Discussed at [facility] 01/31/24, no longer deemed a candidate for LVAD at this time. Patient aware that this would not be an option for him if readmission were to reoccur, he was agreeable with this plan.   Will arrange close f/u in AHF clinic. Stable for discharge per Dr. Zenaida.   Physical exam:  General:  elderly, frail appearing.  No respiratory difficulty Neck: JVD flat.  Cor: Regular rate & rhythm. No murmurs. Lungs: clear Extremities: no edema  Neuro: alert & oriented x 3. Affect pleasant.   Assessment/Plan: 1. 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  - Echo 01/11/24 EF 20-25%, G3DD, mildly reduced RV, mild MR - 1/14: RHC: RA 13, PA 39/18 (26), PCW 20, TD CO/CI 2.81/1.49, PAPi 1.7 - PAC in place; hemodynamics as above. PAPi improved with diuresis - Co-ox stable off milrinone .  - Increase torsemide  40 mg daily>60 daily. (Was on 60 BID PTA) - Continue jardiance  10 mg daily; no LVAD - Continue  digoxin  0.125 daily - Off midodrine , Veroquvo, Toprol  XL and spironolactone  with hypotension.  - Despite multiple conversations with patient and family they have opted for no LVAD. Also very reluctant for to go home on milrinone , thankfully he was able to wean off with stable co-ox.  - No longer a candidate for advanced therapies as discussed at [facility] 01/31/24   2. CAD:  Hx inferior STEMI 10/23 s/p PCI/DES to RCA. Anterior STEMI 12/10/22, cath showed occluded pLAD treated with PCI/DES, prior RCA stent patent, severe disease OM1 and distal LCx treated medically. No chest pain.  - > 1 year post-PCI, will hold Plavix  pre-potential LVAD.   - No ASA given Eliquis  use.  - Continue rosuvastatin  40 mg + Zetia  10 mg daily.  LDL above goal < 55 in 11/25, would ideally get on Repatha but for now will focus on preparation for LVAD.   - No s/s angina   3. Atrial fibrillation: Paroxysmal.  - In NSR today. Restart Eliquis  5 mg BID - Continue amiodarone  100 mg daily, recent LFTs and TSH normal.  - no change   4. DM II: Continue Jardiance  (hold if LVAD surgery set up).    5. 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 Not a smoker, no known lung disease. CXR has shown some chronic interstitial changes. HRCT chest with bibasilar bronchiectasis that may be due to aspiration (though swallow study negative).  V/Q scan showed no chronic PE. Rheumatologic serologies negative. RHC 1/14 with improved PA pressure, only mild pulmonary venous hypertension.  - Continue tadalafil  20 mg daily.  6. Pleural effusion: S/p right thoracentesis 3/25 and again in 11/25. Pleural fluid was transudative by Light's criteria, no malignant cells. Suspect he has reaccumulated pleural fluid.   - CXR/CT with moderate R pleural effusion on admission. Small at discharge post diuresis   7. GI: History of early satiety, GI discomfort.  CT abdomen/pelvis with gastric wall thickening.  GI was consulted and EGD was performed, no definite evidence for malignancy, biopsies taken for H pylori testing and pathology => negative for H pylori and no gastric cancer.  Gastric emptying study normal in 9/25.  Abdominal US  showed normal appearing liver in 9/25.  Virtual colonoscopy unremarkable.  He actually is feeling better with improved appetite.    8. 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  - Continue Levoxyl , recent TSH ok.    9. Fe deficiency anemia: He has had IV FE.   Discharge Vitals: Blood pressure 94/65, pulse 75, temperature 97.8 F (36.6 C), temperature source Oral, resp. rate 17, height 6' (1.829 m), weight 64.3 kg, SpO2 97%.  Labs: Lab Results  Component Value Date   WBC 6.5 01/31/2024   HGB 11.7 (L) 01/31/2024   HCT 36.1 (L) 01/31/2024   MCV 85.1 01/31/2024   PLT 216 01/31/2024    Recent Labs  Lab 01/31/24 0500  NA 134*  K 4.3  CL 96*  CO2 28  BUN 31*  CREATININE 1.21  CALCIUM  9.4  GLUCOSE 126*   Lab Results  Component Value Date   CHOL 158 11/27/2023   HDL 62 11/27/2023   LDLCALC 84 11/27/2023   TRIG 60 11/27/2023   BNP (last 3 results) Recent Labs    07/19/23 1333 08/25/23 1125 10/20/23 1325  BNP 1,374.0* 1,477.8* 1,886.7*    ProBNP (last 3 results) Recent Labs    01/17/24 1236 01/23/24 1118 01/25/24 1456  PROBNP 1,945.0* 3,357.0* 2,392.0*    Diagnostic Studies/Procedures   1/14: RHC: RA 13, PA 39/18 (26), PCW 20, TD CO/CI 2.81/1.49, PAPi   Discharge Medications   Allergies as of 01/31/2024       Reactions   Bovine (beef) Protein-containing Drug Products    Porcine (pork) Protein-containing Drug Products Other (See Comments)   Religious observance        Medication List     STOP taking these medications    clopidogrel  75 MG tablet Commonly known as: PLAVIX    Furoscix  80 MG/10ML Ctkt Generic drug: Furosemide    midodrine  5 MG tablet Commonly known as: PROAMATINE        TAKE these medications     acetaminophen  325 MG tablet Commonly known as: TYLENOL  Take 1-2 tablets (325-650 mg total) by mouth every 4 (four) hours as needed for mild pain (pain score 1-3).   amiodarone  200 MG tablet Commonly known as: PACERONE  Take 1/2 tablet (100 mg total) by mouth daily.   apixaban  5 MG Tabs tablet Commonly known as: ELIQUIS  Take 1 tablet (5 mg total) by mouth 2 (two) times daily.   digoxin  0.125 MG tablet Commonly known as: LANOXIN  Take 1 tablet (0.125 mg total) by mouth daily.   docusate sodium  100 MG capsule Commonly known as: COLACE Take 1 capsule (100 mg total) by mouth daily. Start taking on: February 01, 2024   ezetimibe  10 MG tablet Commonly known as: ZETIA  Take 1 tablet (10 mg total) by mouth daily.   hyoscyamine  0.125 MG tablet Commonly known as: LEVSIN  Take 1 tablet (0.125 mg total) by mouth every 6 (six) hours as needed for cramping.   Jardiance   25 MG Tabs tablet Generic drug: empagliflozin  TAKE ONE TABLET BY MOUTH DAILY   levothyroxine  112 MCG tablet Commonly known as: SYNTHROID  Take 1 tablet (112 mcg total) by mouth daily.   linaclotide  145 MCG Caps capsule Commonly known as: Linzess  Take 1 capsule (145 mcg total) by mouth daily before breakfast. Lot: [id number], exp: 03-2024 What changed:  when to take this reasons to take this   melatonin 5 MG Tabs Take 5 mg by mouth at bedtime as needed (sleep).   pantoprazole  40 MG tablet Commonly known as: PROTONIX  Take 1 tablet (40 mg total) by mouth 2 (two) times daily before a meal.   potassium chloride  SA 20 MEQ tablet Commonly known as: KLOR-CON  M Take 1 tablet (20 mEq total) by mouth daily. What changed:  how much to take when to take this   rosuvastatin  40 MG tablet Commonly known as: CRESTOR  Take 40 mg by mouth daily.   tadalafil  20 MG tablet Commonly known as: CIALIS  Take 1 tablet (20 mg total) by mouth daily.   torsemide  20 MG tablet Commonly known as: DEMADEX  Take 3 tablets (60 mg total) by  mouth daily. What changed: when to take this   Verquvo  5 MG Tabs Generic drug: Vericiguat  Take 1 tablet (5 mg total) by mouth daily.   VITAMIN K2-VITAMIN D3 PO Take 1 tablet by mouth daily with lunch.        Disposition   The patient will be discharged in stable condition to home.   Follow-up Information     [facility] Follow up on 02/07/2024.   Specialty: Cardiology Why: Follow up in the Advanced Heart Failure Clinic 1/28 at 10am Contact information: [address] [place]  [address] [phone]                  APP Duration of Discharge Encounter: 15 minutes  Signed, Beckey LITTIE Coe  01/31/2024, 1:56 PM

## 2024-01-31 NOTE — Plan of Care (Signed)
" °  Problem: Education: Goal: Understanding of CV disease, CV risk reduction, and recovery process will improve Outcome: Progressing Goal: Individualized Educational Video(s) Outcome: Progressing   Problem: Activity: Goal: Ability to return to baseline activity level will improve Outcome: Progressing   Problem: Cardiovascular: Goal: Ability to achieve and maintain adequate cardiovascular perfusion will improve Outcome: Progressing Goal: Vascular access site(s) Level 0-1 will be maintained Outcome: Progressing   Problem: Health Behavior/Discharge Planning: Goal: Ability to manage health-related needs will improve Outcome: Progressing   Problem: Education: Goal: Knowledge of General Education information will improve Description: Including pain rating scale, medication(s)/side effects and non-pharmacologic comfort measures Outcome: Progressing   Problem: Clinical Measurements: Goal: Ability to maintain clinical measurements within normal limits will improve Outcome: Progressing Goal: Will remain free from infection Outcome: Progressing Goal: Diagnostic test results will improve Outcome: Progressing Goal: Respiratory complications will improve Outcome: Progressing Goal: Cardiovascular complication will be avoided Outcome: Progressing   Problem: Activity: Goal: Risk for activity intolerance will decrease Outcome: Progressing   Problem: Nutrition: Goal: Adequate nutrition will be maintained Outcome: Progressing   Problem: Coping: Goal: Level of anxiety will decrease Outcome: Progressing   Problem: Pain Managment: Goal: General experience of comfort will improve and/or be controlled Outcome: Progressing   "

## 2024-02-01 ENCOUNTER — Telehealth: Payer: Self-pay

## 2024-02-01 NOTE — Transitions of Care (Post Inpatient/ED Visit) (Signed)
" ° °  02/01/2024  Name: Garrett Roberts MRN: [id number] DOB: Feb 16, 1955  Today's TOC FU Call Status: Today's TOC FU Call Status:: Unsuccessful Call (1st Attempt) Unsuccessful Call (1st Attempt) Date: 02/01/24  Attempted to reach the patient regarding the most recent Inpatient/ED visit.  Follow Up Plan: Additional outreach attempts will be made to reach the patient to complete the Transitions of Care (Post Inpatient/ED visit) call.   Shalunda Lindh J. Sydne Krahl RN, MSN [facility], [facility] RN Care Manager Direct Dial: [phone]  Fax: [phone] Website: [email or web address]   "

## 2024-02-06 ENCOUNTER — Telehealth (HOSPITAL_COMMUNITY): Payer: Self-pay

## 2024-02-06 NOTE — Progress Notes (Signed)
 "   Advanced Heart Failure Clinic Note  PCP: Vincente Shivers, NP HF Cardiology: Dr. Rolan  Chief complaint: CHF  69 y.o. with history of CAD, DM II, HTN, HLD, ischemic cardiomyopathy/chronic systolic CHF.   Patient had inferior STEMI 10/23 s/p PCI/DES to RCA. Had residual diffuse disease in diagonals and OMs. EF 40-45% at time of cath. Subsequent echo in 10/23 with EF 55-60%.   He was not taking medications in 2024 after losing insurance. Presented with anterior STEMI 12/10/22. Cardiac cath with 100% proximal LAD treated with PCI/DES. Prior RCA stent patent, severe disease in tortuous ramus and distal LCx treated medically.  Echo with EF 25%, no LV thrombus, AK mid septum to apex, moderately reduced RV systolic function. Course complicated by atrial fibrillation with RVR. Started on IV amiodarone  and underwent DCCV to SR.  GDMT limited by soft blood pressure. Also seen by GI for abdominal pain. Had thickening of stomach on CT scan. H. Pylori later resulted positive after discharge, seen by GI and started on treatment for H pylori-related gastritis.   Returned to ED 12/19/22 with recurrent atrial fibrillation. He converted to SR while in waiting room and was discharged home.  Admitted 2/25 with acute CHF. Diuresed with IV lasix . GDMT cut back 2/2 to low BP, Entresto  and spironolactone  stopped. He was discharged home, weight 154 lbs.  Echo in 2/25 showed EF 25% with mild RV dysfunction and mild RV enlargement, PASP 60 mmHg, moderate MR.   He was seen again in the ER 03/14/23 with worsening fatigue and dyspnea.  Torsemide  was increased to 40 qam/20 qpm.   Patient was set up for RHC in 3/25 due to ongoing severe fatigue. This showed low CI 2.02 by thermodilution as well as moderate pulmonary arterial hypertension, he was admitted and started on milrinone  gtt.  He was gradually titrated off milrinone  and maintained a good co-ox off milrinone .  He had a moderate right pleural effusion, underwent right  thoracentesis.  Fluid was transudative but grew E coli, he was treated with Bactrim  based on sensitivities.  Given early satiety and poor appetite, he had an EGD that showed gastritic, biopsies showed no H pylori or gastric cancer.  HRCT chest showed bibasilar bronchiectasis, ?aspiration. However, swallow study was not suggestive of aspiration. V/Q scan was done due to pulmonary hypertension, this showed no chronic PE.  He was started on sildenafil  and meds were titrated, he felt better when he went home.   Echo in 3/25 showed EF 25-30%, mild RV dysfunction, moderate MR, PASP 62 mmHg.   Biotronik ICD placed in 5/25.  Patient was admitted again in 6/25 with dyspnea and fatigue.  BP was low, midodrine  was started and spironolactone  and tadalafil  were stopped.  RHC again showed mixed pulmonary arterial/venous hypertension and low CI at 2.1 by Fick and thermodilution.  Echo showed EF 20-25%, mild LV dilation, moderate RV dysfunction, normal RV size, PASP 44 mmHg, mild-moderate MR.    Patient had right thoracentesis in 11/25, 1.1 L out, cytology negative.     Patient returns for followup of CHF. Despite use of Furoscix  x 4 days and increase in torsemide , weight is up 4 lbs.  He does not feel like he urinated well with Furoscix .  He is still orthopneic and cannot lie flat.  He has a worsening cough.  Dyspneic with stairs inclines, still doing ok on flat ground.  No lightheadedness, no chest pain.     Labs (12/24): hgb 15.1, BNP 350, K 4.4, creatinine 1.24  Labs (2/25): K 4.1, creatinine 1.48, BNP 481, LFTs normal, free T3/T4 normal Labs (3/25): K 3.5, creatinine 1.44 => 1.27, hgb 13.4, BNP 812, HS-TnI 44 => 39 Labs (6/25): K 4, creatinine 1.39 Labs (7/25): digoxin  0.8, K 4.1, creatinine 1.46, BNP 1374 Labs (8/25): Transferrin saturation 6.5%, hgb 12.9, LFTs normal, K 3.8, creatinine 1.34, BNP 1478 Labs (10/25): Ferritin 19, transferrin saturation 6%, digoxin  < 0.6, TSH 7.9, BNP 1887, K 4.4, creatinine 1.3  => 1.22, LFTs normal, hgb 12.9 Labs (11/25): digoxin  < 0.6, TSH 5.7, K 4.4, creatinine 1.54, LDL 84 Labs (12/25): hgb 14.3 Labs (1/26): pro-BNP 1945, TSH normal, AST/ALT normal, K 4, creatinine 1.39, hgb 12.8  SH: No ETOH or tobacco use.  Lives at home in [place] with his wife and children (4 daughters). Originally from [place].   PMH: 1. Chronic systolic CHF: Ischemic cardiomyopathy.  Biotronik ICD.  - Echo (12/24): EF 20-25%, LAD territory WMAs, moderate LVH, moderately decreased RV systolic function, mild MR.  - Echo (2/25): EF 25% with mild RV dysfunction and mild RV enlargement, PASP 60 mmHg, moderate MR.  - RHC (3/25): mean RA 2, PA 60/18 mean 31, mean PCWP 17, CI 2.02 thermo, CI 2.52 Fick, PAPi 21, PVR 3.7 WU.  - Echo (3/25): EF 25-30%, mild RV dysfunction, moderate MR, PASP 62 mmHg.  - Echo (6/25): EF 20-25%, mild LV dilation, moderate RV dysfunction, normal RV size, PASP 44 mmHg, mild-moderate MR. - RHC (6/25): mean RA 5, PA 62/21 mean 35, mean PCWP 23, CI 2.1 by Fick and thermo, PVR 3.0 WU.  2. CAD: Inferior STEMI 10/23 with DES to RCA.  - Anterior STEMI (11/24): Occluded proximal LAD treated with DES, occluded D2, 90% OM1, 95% distal LCx.  Distal LCx and OM1 treated medically.  3. Atrial fibrillation: Paroxysmal.  - DCCV in 12/24.  4. H pylori gastritis 5. Hyperlipidemia 6. Type 2 diabetes 7. Pulmonary hypertension: RHC (3/25) with mean RA 2, PA 60/18 mean 31, mean PCWP 17, CI 2.02 thermo, CI 2.52 Fick, PAPi 21, PVR 3.7 WU. - V/Q scan (3/25): No chronic PE.  - HRCT chest (3/25): bibasilar bronchiectasis, ?aspiration.  - ANA, RF, anti-SCL70 Ab, anti-centromere Ab all negative.  8. Pleural effusion: On right, s/p thoracentesis.  9. Gastric emptying study in 9/25: normal 10. Fe deficiency anemia  Current Outpatient Medications  Medication Sig Dispense Refill   acetaminophen  (TYLENOL ) 325 MG tablet Take 1-2 tablets (325-650 mg total) by mouth every 4 (four) hours as needed for  mild pain (pain score 1-3).     amiodarone  (PACERONE ) 200 MG tablet Take 1/2 tablet (100 mg total) by mouth daily. 30 tablet 5   apixaban  (ELIQUIS ) 5 MG TABS tablet Take 1 tablet (5 mg total) by mouth 2 (two) times daily.     digoxin  (LANOXIN ) 0.125 MG tablet Take 1 tablet (0.125 mg total) by mouth daily. 90 tablet 3   docusate sodium  (COLACE) 100 MG capsule Take 1 capsule (100 mg total) by mouth daily. 10 capsule 0   ezetimibe  (ZETIA ) 10 MG tablet Take 1 tablet (10 mg total) by mouth daily. 90 tablet 3   hyoscyamine  (LEVSIN ) 0.125 MG tablet Take 1 tablet (0.125 mg total) by mouth every 6 (six) hours as needed for cramping. 30 tablet 3   JARDIANCE  25 MG TABS tablet TAKE ONE TABLET BY MOUTH DAILY 90 tablet 2   levothyroxine  (SYNTHROID ) 112 MCG tablet Take 1 tablet (112 mcg total) by mouth daily. 90 tablet 1   linaclotide  (LINZESS )  145 MCG CAPS capsule Take 1 capsule (145 mcg total) by mouth daily before breakfast. Lot: [id number], exp: 03-2024 (Patient taking differently: Take 145 mcg by mouth daily as needed. Lot: [id number], exp: 03-2024) 12 capsule 0   melatonin 5 MG TABS Take 5 mg by mouth at bedtime as needed (sleep).     pantoprazole  (PROTONIX ) 40 MG tablet Take 1 tablet (40 mg total) by mouth 2 (two) times daily before a meal. 180 tablet 3   potassium chloride  SA (KLOR-CON  M) 20 MEQ tablet Take 1 tablet (20 mEq total) by mouth daily. 180 tablet 6   rosuvastatin  (CRESTOR ) 40 MG tablet Take 40 mg by mouth daily.     tadalafil  (CIALIS ) 20 MG tablet Take 1 tablet (20 mg total) by mouth daily. 90 tablet 3   torsemide  (DEMADEX ) 20 MG tablet Take 3 tablets (60 mg total) by mouth daily. 60 tablet 6   Vericiguat  (VERQUVO ) 5 MG TABS Take 1 tablet (5 mg total) by mouth daily. 30 tablet 3   Vitamin D-Vitamin K (VITAMIN K2-VITAMIN D3 PO) Take 1 tablet by mouth daily with lunch.     No current facility-administered medications for this visit.   Allergies  Allergen Reactions   Bovine (Beef) Protein-Containing  Drug Products    Porcine (Pork) Protein-Containing Drug Products Other (See Comments)    Religious observance   Family History  Problem Relation Age of Onset   Diabetes Mother    Chronic Renal Failure Father    Diabetes Father    Chronic Renal Failure Brother    Diabetes Brother    Diabetes Brother    Heart attack Brother    Stroke Brother    Wt Readings from Last 3 Encounters:  01/31/24 64.3 kg (141 lb 12.1 oz)  01/23/24 73.3 kg (161 lb 8 oz)  01/17/24 71.5 kg (157 lb 9.6 oz)  There were no vitals taken for this visit.  PHYSICAL EXAM: General: NAD Neck: JVP 14 cm, no thyromegaly or thyroid  nodule.  Lungs: Decreased BS right base. CV: Nondisplaced PMI.  Heart regular S1/S2, no S3/S4, no murmur.  2+ edema to knees.  No carotid bruit.  Normal pedal pulses.  Abdomen: Soft, nontender, no hepatosplenomegaly, no distention.  Skin: Intact without lesions or rashes.  Neurologic: Alert and oriented x 3.  Psych: Normal affect. Extremities: No clubbing or cyanosis.  HEENT: Normal.   ASSESSMENT & PLAN: 1. Chronic systolic CHF/ischemic cardiomyopathy:  Biotronik ICD.  Echo 12/24 with EF 20-25%, no LV thrombus, AK mid septum to apex, moderately reduced RV systolic function. Echo 2/25 with EF 25%, mildly reduced RV function.  Low BP has limited GDMT titration.  His profound fatigue has been concerning for low output HF. RHC 3/25 showed low CI by thermo (2.05) but preserved by Fick.  Borderline elevated PCWP, normal RA pressure.  There was also moderate mixed pulmonary arterial/pulmonary venous hypertension.  With his significant symptoms, he was started on milrinone  0.25 and added sildenafil  to try to lower PA pressure.  Milrinone  was later weaned off with medication adjustment, and co-ox remained excellent.  He was readmitted in 6/25 with CHF; RHC showed moderate mixed pulmonary arterial/pulmonary venous hypertension with CI low at 2.1 by Fick and thermo.  Echo in 6/25 showed EF 20-25%, mild LV  dilation, moderate RV dysfunction, normal RV size, PASP 44 mmHg, mild-moderate MR.  We have had to peel back on GDMT due to low BP and he is now taking midodrine .  With low output heart failure (CI  2.1 on last RHC and inability to tolerate GDMT - now on midodrine ), we have been discussing advanced therapies.  He is not interested in transplant and at his age, it would be more difficult to obtain. I think he would be a good LVAD candidate. We have a window at this time while his RV function is not severely depressed and his renal function is relatively preserved.  He has completed workup.  We had him set up for LVAD surgery, but he decided that he wanted to wait longer (daughter getting married).  At last appointment, he had gained 17 lbs and was significantly volume overloaded with NYHA class III symptoms.  I gave him Furoscix  over the weekend and increased his torsemide  after that, but today he is up another 4 lbs.  He remains quite volume overloaded and orthopnea is worse.  Unfortunately, he appears to have failed outpatient diuresis.  I remain concerned that we miss his window for LVAD implantation.  - He will continue torsemide  60 mg po bid today.  Check BMET/BNP today.  - I will bring him in for RHC tomorrow. If output is low, I will start him on milrinone  gtt.  I will plan to admit him regardless for diuresis.  - Continue Jardiance  10 mg daily. - Continue digoxin  0.125 daily, check digoxin  level today.  - He is on midodrine  5 mg tid to maintain MAP.  SBP remain in 90s, he denies lightheadedness.   - off Toprol  XL with low output.  - Off spironolactone  with low BP.  - Continue Verquvo  5 mg daily.  - We again discussed LVAD, his daughter is here today.  I would like to optimize his volume status and then get LVAD surgery arranged.  He still seems on the fence about the device.  As above, I am going to bring him in for RHC tomorrow and likely admission for milrinone /diuresis.  We will need to talk more  about LVAD.  As above, I am concerned that we could miss our window of opportunity for placement.  2. CAD:  Hx inferior STEMI 10/23 s/p PCI/DES to RCA. Anterior STEMI 12/10/22, cath showed occluded pLAD treated with PCI/DES, prior RCA stent patent, severe disease OM1 and distal LCx treated medically. No chest pain.  - Continue Plavix  => will need to hold this if he agrees to LVAD.   - No ASA given Eliquis  use.  - Continue rosuvastatin  40 mg + Zetia  10 mg daily.  LDL above goal < 55 in 11/25, would ideally get on Repatha but for now will focus on preparation for LVAD.   3. Atrial fibrillation: Paroxysmal. Has been in NSR.  - continue Eliquis  5 mg bid  - Continue amiodarone  100 mg daily, recent LFTs and TSH normal.  He will need a regular eye exam.  4. DM II: Continue Jardiance   5. 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 Not a smoker, no known lung disease. CXR has shown some chronic interstitial changes. HRCT chest with bibasilar bronchiectasis that may be due to aspiration (though swallow study negative).  V/Q scan showed no chronic PE. Rheumatologic serologies negative - Continue tadalafil  20 mg daily.   6. Pleural effusion: S/p right thoracentesis 3/25 and again in 11/25. Pleural fluid was transudative by Light's criteria, no malignant cells. By exam today, I suspect he has reaccumulated pleural fluid.   - CXR if admitted tomorrow, may need repeat thoracentesis.  7. GI: History of early satiety, GI discomfort.  CT abdomen/pelvis with gastric  wall thickening. GI was consulted and EGD was performed, no definite evidence for malignancy, biopsies taken for H pylori testing and pathology => negative for H pylori and no gastric cancer.  Gastric emptying study normal in 9/25.  Abdominal US  showed normal appearing liver in 9/25.  Virtual colonoscopy unremarkable.  He actually is feeling better with improved appetite.  8. Hypothyroidism: Likely related to  amiodarone  use.  - Continue Levoxyl , recent TSH ok.  9. Fe deficiency anemia: He has had IV FE.   I spent 43 minutes reviewing records, interviewing/examining patient, and managing orders. Patient's daughter was present.   RHC tomorrow and likely admission.    Harlene HERO [facility] 02/06/24   "

## 2024-02-06 NOTE — Telephone Encounter (Signed)
 Called to confirm/remind patient of their appointment at the Advanced Heart Failure Clinic on 02/07/24.   Appointment:   [x] Confirmed  [] Left mess   [] No answer/No voice mail  [] VM Full/unable to leave message  [] Phone not in service  Patient reminded to bring all medications and/or complete list.  Confirmed patient has transportation. Gave directions, instructed to utilize valet parking.

## 2024-02-07 ENCOUNTER — Ambulatory Visit (HOSPITAL_COMMUNITY)
Admit: 2024-02-07 | Discharge: 2024-02-07 | Disposition: A | Discharge status: Home / Self Care | Source: Ambulatory Visit | Attending: Family Medicine | Admitting: Family Medicine

## 2024-02-07 ENCOUNTER — Other Ambulatory Visit: Payer: Self-pay | Admitting: "Endocrinology

## 2024-02-07 ENCOUNTER — Telehealth (HOSPITAL_COMMUNITY): Payer: Self-pay

## 2024-02-07 ENCOUNTER — Other Ambulatory Visit (HOSPITAL_COMMUNITY): Payer: Self-pay

## 2024-02-07 ENCOUNTER — Ambulatory Visit (HOSPITAL_COMMUNITY): Payer: Self-pay | Admitting: Family Medicine

## 2024-02-07 ENCOUNTER — Telehealth (HOSPITAL_COMMUNITY): Payer: Self-pay | Admitting: Pharmacist

## 2024-02-07 ENCOUNTER — Encounter (HOSPITAL_COMMUNITY): Payer: Self-pay

## 2024-02-07 VITALS — BP 92/64 | HR 65 | Ht 72.0 in | Wt 145.0 lb

## 2024-02-07 DIAGNOSIS — Z7901 Long term (current) use of anticoagulants: Secondary | ICD-10-CM | POA: Diagnosis not present

## 2024-02-07 DIAGNOSIS — J90 Pleural effusion, not elsewhere classified: Secondary | ICD-10-CM | POA: Insufficient documentation

## 2024-02-07 DIAGNOSIS — Z7984 Long term (current) use of oral hypoglycemic drugs: Secondary | ICD-10-CM | POA: Diagnosis not present

## 2024-02-07 DIAGNOSIS — E785 Hyperlipidemia, unspecified: Secondary | ICD-10-CM | POA: Insufficient documentation

## 2024-02-07 DIAGNOSIS — I252 Old myocardial infarction: Secondary | ICD-10-CM | POA: Insufficient documentation

## 2024-02-07 DIAGNOSIS — Z79899 Other long term (current) drug therapy: Secondary | ICD-10-CM | POA: Insufficient documentation

## 2024-02-07 DIAGNOSIS — Z955 Presence of coronary angioplasty implant and graft: Secondary | ICD-10-CM | POA: Insufficient documentation

## 2024-02-07 DIAGNOSIS — I110 Hypertensive heart disease with heart failure: Secondary | ICD-10-CM | POA: Diagnosis present

## 2024-02-07 DIAGNOSIS — I255 Ischemic cardiomyopathy: Secondary | ICD-10-CM | POA: Diagnosis present

## 2024-02-07 DIAGNOSIS — I5022 Chronic systolic (congestive) heart failure: Secondary | ICD-10-CM

## 2024-02-07 DIAGNOSIS — Z95810 Presence of automatic (implantable) cardiac defibrillator: Secondary | ICD-10-CM | POA: Diagnosis not present

## 2024-02-07 DIAGNOSIS — E039 Hypothyroidism, unspecified: Secondary | ICD-10-CM | POA: Insufficient documentation

## 2024-02-07 DIAGNOSIS — I5084 End stage heart failure: Secondary | ICD-10-CM | POA: Insufficient documentation

## 2024-02-07 DIAGNOSIS — E119 Type 2 diabetes mellitus without complications: Secondary | ICD-10-CM | POA: Diagnosis not present

## 2024-02-07 DIAGNOSIS — I2510 Atherosclerotic heart disease of native coronary artery without angina pectoris: Secondary | ICD-10-CM | POA: Insufficient documentation

## 2024-02-07 DIAGNOSIS — I272 Pulmonary hypertension, unspecified: Secondary | ICD-10-CM | POA: Insufficient documentation

## 2024-02-07 DIAGNOSIS — D509 Iron deficiency anemia, unspecified: Secondary | ICD-10-CM | POA: Diagnosis not present

## 2024-02-07 DIAGNOSIS — Z79890 Hormone replacement therapy (postmenopausal): Secondary | ICD-10-CM | POA: Diagnosis not present

## 2024-02-07 DIAGNOSIS — I480 Paroxysmal atrial fibrillation: Secondary | ICD-10-CM

## 2024-02-07 LAB — CBC
HCT: 36.9 % — ABNORMAL LOW (ref 39.0–52.0)
Hemoglobin: 11.7 g/dL — ABNORMAL LOW (ref 13.0–17.0)
MCH: 27.5 pg (ref 26.0–34.0)
MCHC: 31.7 g/dL (ref 30.0–36.0)
MCV: 86.6 fL (ref 80.0–100.0)
Platelets: 275 10*3/uL (ref 150–400)
RBC: 4.26 MIL/uL (ref 4.22–5.81)
RDW: 15.9 % — ABNORMAL HIGH (ref 11.5–15.5)
WBC: 4.8 10*3/uL (ref 4.0–10.5)
nRBC: 0 % (ref 0.0–0.2)

## 2024-02-07 LAB — DIGOXIN LEVEL: Digoxin Level: 1.2 ng/mL (ref 0.8–2.0)

## 2024-02-07 LAB — PRO BRAIN NATRIURETIC PEPTIDE: Pro Brain Natriuretic Peptide: 4079 pg/mL — ABNORMAL HIGH

## 2024-02-07 LAB — BASIC METABOLIC PANEL WITH GFR
Anion gap: 8 (ref 5–15)
BUN: 29 mg/dL — ABNORMAL HIGH (ref 8–23)
CO2: 32 mmol/L (ref 22–32)
Calcium: 9.4 mg/dL (ref 8.9–10.3)
Chloride: 97 mmol/L — ABNORMAL LOW (ref 98–111)
Creatinine, Ser: 1.6 mg/dL — ABNORMAL HIGH (ref 0.61–1.24)
GFR, Estimated: 47 mL/min — ABNORMAL LOW
Glucose, Bld: 165 mg/dL — ABNORMAL HIGH (ref 70–99)
Potassium: 4.6 mmol/L (ref 3.5–5.1)
Sodium: 137 mmol/L (ref 135–145)

## 2024-02-07 MED ORDER — ROSUVASTATIN CALCIUM 40 MG PO TABS
40.0000 mg | ORAL_TABLET | Freq: Every day | ORAL | 3 refills | Status: AC
  Filled 2024-02-07: qty 90, 90d supply, fill #0
  Filled 2024-02-16: qty 90, 90d supply, fill #1
  Filled 2024-02-16: qty 90, 90d supply, fill #0

## 2024-02-07 MED ORDER — DIGOXIN 125 MCG PO TABS: 0.0625 mg | ORAL_TABLET | Freq: Every day | ORAL | Status: AC

## 2024-02-07 MED ORDER — POTASSIUM CHLORIDE CRYS ER 20 MEQ PO TBCR
20.0000 meq | EXTENDED_RELEASE_TABLET | Freq: Two times a day (BID) | ORAL | 3 refills | Status: AC
  Filled 2024-02-07 – 2024-02-16 (×4): qty 180, 90d supply, fill #0

## 2024-02-07 MED ORDER — VERQUVO 2.5 MG PO TABS
2.5000 mg | ORAL_TABLET | Freq: Every day | ORAL | 11 refills | Status: AC
  Filled 2024-02-07 – 2024-02-16 (×4): qty 30, 30d supply, fill #0

## 2024-02-07 MED ORDER — APIXABAN 5 MG PO TABS
5.0000 mg | ORAL_TABLET | Freq: Two times a day (BID) | ORAL | 3 refills | Status: AC
  Filled 2024-02-07 – 2024-02-16 (×2): qty 180, 90d supply, fill #0
  Filled 2024-02-16: qty 180, 90d supply, fill #1

## 2024-02-07 MED ORDER — TORSEMIDE 20 MG PO TABS
40.0000 mg | ORAL_TABLET | Freq: Two times a day (BID) | ORAL | 2 refills | Status: AC
  Filled 2024-02-07: qty 200, 50d supply, fill #0

## 2024-02-07 MED ORDER — JARDIANCE 25 MG PO TABS
25.0000 mg | ORAL_TABLET | Freq: Every day | ORAL | 3 refills | Status: AC
  Filled 2024-02-07 – 2024-02-16 (×3): qty 90, 90d supply, fill #0
  Filled 2024-02-16: qty 30, 30d supply, fill #0
  Filled 2024-02-16: qty 90, 90d supply, fill #0

## 2024-02-07 NOTE — Patient Instructions (Signed)
 CHANGE Torsemide  to 40 mg Twice daily  CHANGE Potassium to 20 mEq ( 1 Tab)  Twice daily  CHANGE Verquvo  to 2.5 mg daily.  Labs done today, your results will be available in MyChart, we will contact you for abnormal readings.  Your physician recommends that you schedule a follow-up appointment in: 2 weeks.  If you have any questions or concerns before your next appointment please send us  a message through [facility] or call our office at [phone].    TO LEAVE A MESSAGE FOR THE NURSE SELECT OPTION 2, PLEASE LEAVE A MESSAGE INCLUDING: YOUR NAME DATE OF BIRTH CALL BACK NUMBER REASON FOR CALL**this is important as we prioritize the call backs  YOU WILL RECEIVE A CALL BACK THE SAME DAY AS LONG AS YOU CALL BEFORE 4:00 PM  At the Advanced [facility] Failure Clinic, you and your health needs are our priority. As part of our continuing mission to provide you with exceptional heart care, we have created designated Provider Care Teams. These Care Teams include your primary Cardiologist (physician) and Advanced Practice Providers (APPs- Physician Assistants and Nurse Practitioners) who all work together to provide you with the care you need, when you need it.   You may see any of the following providers on your designated Care Team at your next follow up: Dr Toribio Fuel Dr Ezra Shuck Dr. Morene Brownie Greig Mosses, NP Caffie Shed, [place] [facility] [place], [place] Beckey Coe, NP Jordan Lee, NP Ellouise Class, NP Tinnie Redman, PharmD Jaun Bash, PharmD   Please be sure to bring in all your medications bottles to every appointment.    Thank you for choosing [facility]

## 2024-02-07 NOTE — Progress Notes (Signed)
"   ReDS Vest / Clip - 02/07/24 1000       ReDS Vest / Clip   Station Marker C    Ruler Value 26    ReDS Value Range Moderate volume overload    ReDS Actual Value 40          "

## 2024-02-07 NOTE — Telephone Encounter (Addendum)
 Pt aware, agreeable, and verbalized understanding  Med list updated labs ordered and scheduled   ----- Message from Harlene CHRISTELLA Gainer sent at 02/07/2024 12:19 PM EST ----- Dig level elevated, and renal function elevated.   This digoxin  level was a trough today, I confirmed with [place] he did not take his med yet. Please reduce digoxin  to 0.0625 mg daily and he will need a digoxin  TROUGH next week

## 2024-02-07 NOTE — Telephone Encounter (Signed)
 Advanced Heart Failure Patient Advocate Encounter  Received notification that prior authorization is needed for Jardiance  and Verquvo . Upon calling Medicaid for prior auth, I have been informed that this patient has Medicare coverage that will go into effect on 02/01. Will review coverage next week to see if prior authorizations need to be submitted for Medicare.  Rachel DEL, CPhT Rx Patient Advocate Phone: [phone]

## 2024-02-08 ENCOUNTER — Other Ambulatory Visit (HOSPITAL_COMMUNITY): Payer: Self-pay | Admitting: Cardiology

## 2024-02-09 ENCOUNTER — Other Ambulatory Visit (HOSPITAL_COMMUNITY): Payer: Self-pay

## 2024-02-09 MED ORDER — PANTOPRAZOLE SODIUM 40 MG PO TBEC
40.0000 mg | DELAYED_RELEASE_TABLET | Freq: Two times a day (BID) | ORAL | 3 refills | Status: AC
  Filled 2024-02-09: qty 180, 90d supply, fill #0
  Filled 2024-02-16: qty 60, 30d supply, fill #0
  Filled 2024-02-16 (×3): qty 180, 90d supply, fill #0

## 2024-02-12 ENCOUNTER — Other Ambulatory Visit (HOSPITAL_COMMUNITY): Payer: Self-pay

## 2024-02-13 ENCOUNTER — Encounter: Payer: Self-pay | Admitting: Pharmacy Technician

## 2024-02-13 ENCOUNTER — Other Ambulatory Visit (HOSPITAL_COMMUNITY): Payer: Self-pay

## 2024-02-13 ENCOUNTER — Encounter (HOSPITAL_COMMUNITY): Payer: Self-pay

## 2024-02-14 ENCOUNTER — Ambulatory Visit (HOSPITAL_COMMUNITY)
Admission: RE | Admit: 2024-02-14 | Discharge: 2024-02-14 | Disposition: A | Source: Ambulatory Visit | Attending: Cardiology

## 2024-02-14 ENCOUNTER — Ambulatory Visit (HOSPITAL_COMMUNITY): Payer: Self-pay | Admitting: Family Medicine

## 2024-02-14 DIAGNOSIS — I5022 Chronic systolic (congestive) heart failure: Secondary | ICD-10-CM

## 2024-02-14 LAB — DIGOXIN LEVEL: Digoxin Level: 0.8 ng/mL (ref 0.8–2.0)

## 2024-02-15 ENCOUNTER — Other Ambulatory Visit (HOSPITAL_COMMUNITY): Payer: Self-pay

## 2024-02-16 ENCOUNTER — Other Ambulatory Visit (HOSPITAL_COMMUNITY): Payer: Self-pay

## 2024-02-16 ENCOUNTER — Other Ambulatory Visit: Payer: Self-pay

## 2024-02-16 ENCOUNTER — Telehealth (HOSPITAL_COMMUNITY): Payer: Self-pay

## 2024-02-16 ENCOUNTER — Other Ambulatory Visit: Payer: Self-pay | Admitting: "Endocrinology

## 2024-02-16 DIAGNOSIS — E039 Hypothyroidism, unspecified: Secondary | ICD-10-CM

## 2024-02-16 LAB — TSH+FREE T4: TSH W/REFLEX TO FT4: 3.8 m[IU]/L (ref 0.40–4.50)

## 2024-02-16 MED ORDER — LEVOTHYROXINE SODIUM 112 MCG PO TABS
112.0000 ug | ORAL_TABLET | Freq: Every day | ORAL | 1 refills | Status: AC
  Filled 2024-02-16: qty 90, 90d supply, fill #0

## 2024-02-16 NOTE — Telephone Encounter (Signed)
 Medicare coverage for patient (Garrett Roberts) added to [facility]. Jardiance  processed successfully, prior authorization submitted for Verquvo  in separate encounter.

## 2024-02-16 NOTE — Telephone Encounter (Signed)
 Prior authorization faxed to [facility] on 02/16/2024

## 2024-02-16 NOTE — Telephone Encounter (Signed)
 Advanced Heart Failure Patient Advocate Encounter  Expedited prior authorization forms for Verquvo  faxed to [facility] on 02/16/2024.  Rachel DEL, CPhT Rx Patient Advocate Phone: [phone]

## 2024-02-16 NOTE — Telephone Encounter (Signed)
 [employer] now on file, Jardiance  has been rebilled and refilled, Verquvo  prior auth in process

## 2024-02-21 ENCOUNTER — Ambulatory Visit: Admitting: "Endocrinology

## 2024-02-21 ENCOUNTER — Ambulatory Visit (HOSPITAL_COMMUNITY)

## 2024-04-17 ENCOUNTER — Encounter

## 2024-07-16 ENCOUNTER — Ambulatory Visit

## 2024-07-17 ENCOUNTER — Encounter

## 2024-10-16 ENCOUNTER — Encounter

## 2025-01-15 ENCOUNTER — Encounter

## 2025-04-16 ENCOUNTER — Encounter
